# Patient Record
Sex: Male | Born: 1940 | Race: White | Hispanic: No | Smoking: Former smoker
Health system: Southern US, Community
[De-identification: ages and names within clinical notes are randomized; demographics above are authoritative.]

## PROBLEM LIST (undated history)

## (undated) DIAGNOSIS — M1990 Unspecified osteoarthritis, unspecified site: Secondary | ICD-10-CM

## (undated) DIAGNOSIS — E1121 Type 2 diabetes mellitus with diabetic nephropathy: Secondary | ICD-10-CM

## (undated) DIAGNOSIS — K219 Gastro-esophageal reflux disease without esophagitis: Secondary | ICD-10-CM

## (undated) DIAGNOSIS — M810 Age-related osteoporosis without current pathological fracture: Secondary | ICD-10-CM

## (undated) DIAGNOSIS — F329 Major depressive disorder, single episode, unspecified: Secondary | ICD-10-CM

## (undated) DIAGNOSIS — I739 Peripheral vascular disease, unspecified: Secondary | ICD-10-CM

## (undated) DIAGNOSIS — F32A Depression, unspecified: Secondary | ICD-10-CM

## (undated) DIAGNOSIS — F419 Anxiety disorder, unspecified: Secondary | ICD-10-CM

## (undated) DIAGNOSIS — K922 Gastrointestinal hemorrhage, unspecified: Secondary | ICD-10-CM

## (undated) DIAGNOSIS — N186 End stage renal disease: Secondary | ICD-10-CM

## (undated) DIAGNOSIS — I10 Essential (primary) hypertension: Secondary | ICD-10-CM

## (undated) DIAGNOSIS — J449 Chronic obstructive pulmonary disease, unspecified: Secondary | ICD-10-CM

## (undated) DIAGNOSIS — E119 Type 2 diabetes mellitus without complications: Secondary | ICD-10-CM

## (undated) DIAGNOSIS — E1140 Type 2 diabetes mellitus with diabetic neuropathy, unspecified: Secondary | ICD-10-CM

## (undated) DIAGNOSIS — D649 Anemia, unspecified: Secondary | ICD-10-CM

## (undated) DIAGNOSIS — K2970 Gastritis, unspecified, without bleeding: Secondary | ICD-10-CM

## (undated) DIAGNOSIS — I4891 Unspecified atrial fibrillation: Secondary | ICD-10-CM

## (undated) DIAGNOSIS — I779 Disorder of arteries and arterioles, unspecified: Secondary | ICD-10-CM

## (undated) DIAGNOSIS — G709 Myoneural disorder, unspecified: Secondary | ICD-10-CM

## (undated) DIAGNOSIS — I2510 Atherosclerotic heart disease of native coronary artery without angina pectoris: Secondary | ICD-10-CM

## (undated) HISTORY — DX: Type 2 diabetes mellitus with diabetic nephropathy: E11.21

## (undated) HISTORY — PX: AV FISTULA PLACEMENT: SHX1204

## (undated) HISTORY — PX: CORONARY STENT PLACEMENT: SHX1402

## (undated) HISTORY — PX: CARDIAC CATHETERIZATION: SHX172

## (undated) HISTORY — PX: EYE SURGERY: SHX253

## (undated) HISTORY — DX: Age-related osteoporosis without current pathological fracture: M81.0

## (undated) HISTORY — PX: HIP SURGERY: SHX245

## (undated) HISTORY — PX: JOINT REPLACEMENT: SHX530

## (undated) HISTORY — DX: Type 2 diabetes mellitus without complications: E11.9

## (undated) HISTORY — DX: End stage renal disease: N18.6

## (undated) HISTORY — DX: Chronic obstructive pulmonary disease, unspecified: J44.9

## (undated) HISTORY — DX: Type 2 diabetes mellitus with diabetic neuropathy, unspecified: E11.40

---

## 2003-12-07 ENCOUNTER — Other Ambulatory Visit: Payer: Self-pay

## 2003-12-15 ENCOUNTER — Other Ambulatory Visit: Payer: Self-pay

## 2004-12-18 ENCOUNTER — Ambulatory Visit: Payer: Self-pay | Admitting: Pain Medicine

## 2004-12-23 ENCOUNTER — Ambulatory Visit: Payer: Self-pay | Admitting: Pain Medicine

## 2005-01-23 ENCOUNTER — Ambulatory Visit: Payer: Self-pay | Admitting: Pain Medicine

## 2005-01-27 ENCOUNTER — Ambulatory Visit: Payer: Self-pay | Admitting: Pain Medicine

## 2005-02-27 ENCOUNTER — Ambulatory Visit: Payer: Self-pay | Admitting: Pain Medicine

## 2005-03-03 ENCOUNTER — Ambulatory Visit: Payer: Self-pay | Admitting: Pain Medicine

## 2005-03-10 ENCOUNTER — Ambulatory Visit: Payer: Self-pay | Admitting: Pain Medicine

## 2005-04-02 ENCOUNTER — Ambulatory Visit: Payer: Self-pay | Admitting: Pain Medicine

## 2005-05-15 ENCOUNTER — Ambulatory Visit: Payer: Self-pay | Admitting: Pain Medicine

## 2005-05-26 ENCOUNTER — Ambulatory Visit: Payer: Self-pay | Admitting: Pain Medicine

## 2005-07-01 ENCOUNTER — Ambulatory Visit: Payer: Self-pay | Admitting: Pain Medicine

## 2005-07-07 ENCOUNTER — Ambulatory Visit: Payer: Self-pay | Admitting: Pain Medicine

## 2005-08-27 ENCOUNTER — Ambulatory Visit: Payer: Self-pay | Admitting: Pain Medicine

## 2005-09-04 ENCOUNTER — Ambulatory Visit: Payer: Self-pay | Admitting: Pain Medicine

## 2005-09-22 ENCOUNTER — Ambulatory Visit: Payer: Self-pay | Admitting: Pain Medicine

## 2005-10-02 ENCOUNTER — Ambulatory Visit: Payer: Self-pay | Admitting: *Deleted

## 2005-10-28 ENCOUNTER — Ambulatory Visit: Payer: Self-pay | Admitting: Pain Medicine

## 2005-11-03 ENCOUNTER — Ambulatory Visit: Payer: Self-pay | Admitting: Pain Medicine

## 2005-11-19 ENCOUNTER — Ambulatory Visit: Payer: Self-pay | Admitting: Pain Medicine

## 2005-12-11 ENCOUNTER — Ambulatory Visit: Payer: Self-pay | Admitting: Pain Medicine

## 2005-12-15 ENCOUNTER — Ambulatory Visit: Payer: Self-pay | Admitting: Pain Medicine

## 2006-01-06 ENCOUNTER — Ambulatory Visit: Payer: Self-pay | Admitting: Pain Medicine

## 2006-01-14 ENCOUNTER — Ambulatory Visit: Payer: Self-pay | Admitting: Pain Medicine

## 2006-01-15 ENCOUNTER — Ambulatory Visit: Payer: Self-pay | Admitting: Pain Medicine

## 2006-01-29 ENCOUNTER — Ambulatory Visit: Payer: Self-pay | Admitting: Pain Medicine

## 2006-03-03 ENCOUNTER — Ambulatory Visit: Payer: Self-pay | Admitting: Pain Medicine

## 2006-03-09 ENCOUNTER — Ambulatory Visit: Payer: Self-pay | Admitting: Pain Medicine

## 2006-03-31 ENCOUNTER — Ambulatory Visit: Payer: Self-pay | Admitting: Pain Medicine

## 2006-04-28 ENCOUNTER — Ambulatory Visit: Payer: Self-pay | Admitting: Pain Medicine

## 2006-06-16 ENCOUNTER — Ambulatory Visit: Payer: Self-pay | Admitting: Pain Medicine

## 2006-06-22 ENCOUNTER — Ambulatory Visit: Payer: Self-pay | Admitting: Pain Medicine

## 2006-07-23 ENCOUNTER — Ambulatory Visit: Payer: Self-pay | Admitting: Pain Medicine

## 2006-07-27 ENCOUNTER — Ambulatory Visit: Payer: Self-pay | Admitting: Pain Medicine

## 2006-08-27 ENCOUNTER — Ambulatory Visit: Payer: Self-pay | Admitting: Pain Medicine

## 2006-09-14 ENCOUNTER — Ambulatory Visit: Payer: Self-pay | Admitting: Pain Medicine

## 2006-10-20 ENCOUNTER — Ambulatory Visit: Payer: Self-pay | Admitting: Pain Medicine

## 2006-10-28 ENCOUNTER — Ambulatory Visit: Payer: Self-pay | Admitting: Pain Medicine

## 2007-08-31 ENCOUNTER — Ambulatory Visit: Payer: Self-pay | Admitting: Gastroenterology

## 2008-01-04 ENCOUNTER — Inpatient Hospital Stay: Payer: Self-pay | Admitting: Internal Medicine

## 2008-01-04 ENCOUNTER — Other Ambulatory Visit: Payer: Self-pay

## 2008-01-18 ENCOUNTER — Ambulatory Visit: Payer: Self-pay | Admitting: Internal Medicine

## 2008-02-16 ENCOUNTER — Ambulatory Visit: Payer: Self-pay | Admitting: Internal Medicine

## 2011-01-04 ENCOUNTER — Ambulatory Visit: Payer: Self-pay | Admitting: Internal Medicine

## 2011-01-21 ENCOUNTER — Inpatient Hospital Stay: Payer: Self-pay | Admitting: Internal Medicine

## 2011-03-04 ENCOUNTER — Ambulatory Visit: Payer: Self-pay | Admitting: Vascular Surgery

## 2011-03-10 ENCOUNTER — Ambulatory Visit: Payer: Self-pay | Admitting: Internal Medicine

## 2011-05-07 ENCOUNTER — Ambulatory Visit: Payer: Self-pay | Admitting: Nephrology

## 2011-05-30 ENCOUNTER — Ambulatory Visit: Payer: Self-pay | Admitting: Vascular Surgery

## 2011-06-04 ENCOUNTER — Ambulatory Visit: Payer: Self-pay | Admitting: Vascular Surgery

## 2011-10-20 ENCOUNTER — Ambulatory Visit: Payer: Self-pay | Admitting: Vascular Surgery

## 2011-10-20 LAB — POTASSIUM: Potassium: 3.9 mmol/L (ref 3.5–5.1)

## 2011-12-29 ENCOUNTER — Ambulatory Visit: Payer: Self-pay | Admitting: Vascular Surgery

## 2011-12-29 LAB — POTASSIUM: Potassium: 3 mmol/L — ABNORMAL LOW (ref 3.5–5.1)

## 2012-01-07 ENCOUNTER — Inpatient Hospital Stay: Payer: Self-pay | Admitting: Internal Medicine

## 2012-01-07 LAB — TROPONIN I: Troponin-I: <0.02 ng/mL

## 2012-01-08 LAB — BASIC METABOLIC PANEL
BUN: 154 mg/dL — ABNORMAL HIGH (ref 7–18)
BUN: 144 mg/dL — ABNORMAL HIGH (ref 7–18)
Calcium, Total: 8 mg/dL — ABNORMAL LOW (ref 8.5–10.1)
Chloride: 87 mmol/L — ABNORMAL LOW (ref 98–107)
Chloride: 92 mmol/L — ABNORMAL LOW (ref 98–107)
Co2: 30 mmol/L (ref 21–32)
Creatinine: 4.7 mg/dL — ABNORMAL HIGH (ref 0.60–1.30)
EGFR (African American): 14 — ABNORMAL LOW
EGFR (Non-African Amer.): 12 — ABNORMAL LOW
EGFR (Non-African Amer.): 12 — ABNORMAL LOW
Osmolality: 260 (ref 275–301)
Potassium: 2.3 mmol/L — CL (ref 3.5–5.1)
Potassium: 3.4 mmol/L — ABNORMAL LOW (ref 3.5–5.1)
Sodium: 132 mmol/L — ABNORMAL LOW (ref 136–145)

## 2012-01-08 LAB — CBC WITH DIFFERENTIAL/PLATELET
Basophil #: 0.1 10*3/uL (ref 0.0–0.1)
Eosinophil #: 0.4 10*3/uL (ref 0.0–0.7)
HCT: 22.4 % — ABNORMAL LOW (ref 40.0–52.0)
HGB: 7.7 g/dL — ABNORMAL LOW (ref 13.0–18.0)
Lymphocyte #: 1.2 10*3/uL (ref 1.0–3.6)
Lymphocyte %: 12.8 %
MCH: 30.1 pg (ref 26.0–34.0)
MCV: 88 fL (ref 80–100)
Monocyte #: 0.8 x10 3/mm (ref 0.2–1.0)
Platelet: 184 10*3/uL (ref 150–440)
RBC: 2.56 10*6/uL — ABNORMAL LOW (ref 4.40–5.90)

## 2012-01-08 LAB — TROPONIN I
Troponin-I: <0.02 ng/mL
Troponin-I: <0.02 ng/mL

## 2012-01-09 LAB — CBC WITH DIFFERENTIAL/PLATELET
Basophil #: 0.1 10*3/uL (ref 0.0–0.1)
Eosinophil #: 0.5 10*3/uL (ref 0.0–0.7)
Eosinophil %: 6.4 %
HCT: 27.1 % — ABNORMAL LOW (ref 40.0–52.0)
MCH: 29.4 pg (ref 26.0–34.0)
Monocyte #: 0.8 x10 3/mm (ref 0.2–1.0)
RBC: 3.09 10*6/uL — ABNORMAL LOW (ref 4.40–5.90)
RDW: 14.7 % — ABNORMAL HIGH (ref 11.5–14.5)
WBC: 8 10*3/uL (ref 3.8–10.6)

## 2012-01-09 LAB — URIC ACID: Uric Acid: 9.3 mg/dL — ABNORMAL HIGH (ref 3.5–7.2)

## 2012-01-09 LAB — BASIC METABOLIC PANEL
Chloride: 97 mmol/L — ABNORMAL LOW (ref 98–107)
EGFR (African American): 15 — ABNORMAL LOW
EGFR (Non-African Amer.): 13 — ABNORMAL LOW
Osmolality: 325 (ref 275–301)
Sodium: 137 mmol/L (ref 136–145)

## 2012-01-10 LAB — BASIC METABOLIC PANEL
BUN: 122 mg/dL — ABNORMAL HIGH (ref 7–18)
Calcium, Total: 8.4 mg/dL — ABNORMAL LOW (ref 8.5–10.1)
Co2: 24 mmol/L (ref 21–32)
Creatinine: 4.01 mg/dL — ABNORMAL HIGH (ref 0.60–1.30)
EGFR (African American): 17 — ABNORMAL LOW
EGFR (Non-African Amer.): 14 — ABNORMAL LOW
Osmolality: 320 (ref 275–301)
Potassium: 3.5 mmol/L (ref 3.5–5.1)

## 2012-01-11 LAB — CBC WITH DIFFERENTIAL/PLATELET
Basophil #: 0.2 10*3/uL — ABNORMAL HIGH (ref 0.0–0.1)
Basophil %: 1.5 %
Eosinophil #: 0.7 10*3/uL (ref 0.0–0.7)
Eosinophil %: 6.3 %
HCT: 30 % — ABNORMAL LOW (ref 40.0–52.0)
HGB: 9.9 g/dL — ABNORMAL LOW (ref 13.0–18.0)
Lymphocyte #: 1.9 10*3/uL (ref 1.0–3.6)
Lymphocyte %: 17.7 %
MCH: 29.3 pg (ref 26.0–34.0)
MCV: 89 fL (ref 80–100)
Monocyte %: 9.1 %
Neutrophil %: 65.4 %
Platelet: 238 10*3/uL (ref 150–440)
RBC: 3.37 10*6/uL — ABNORMAL LOW (ref 4.40–5.90)
WBC: 10.7 10*3/uL — ABNORMAL HIGH (ref 3.8–10.6)

## 2012-01-11 LAB — COMPREHENSIVE METABOLIC PANEL
Alkaline Phosphatase: 78 U/L (ref 50–136)
Anion Gap: 12 (ref 7–16)
Bilirubin,Total: 0.6 mg/dL (ref 0.2–1.0)
Creatinine: 3.84 mg/dL — ABNORMAL HIGH (ref 0.60–1.30)
EGFR (Non-African Amer.): 15 — ABNORMAL LOW
Osmolality: 308 (ref 275–301)
SGOT(AST): 11 U/L — ABNORMAL LOW (ref 15–37)
Total Protein: 7.4 g/dL (ref 6.4–8.2)

## 2012-01-13 LAB — PATHOLOGY REPORT

## 2012-09-09 ENCOUNTER — Ambulatory Visit: Payer: Self-pay | Admitting: Vascular Surgery

## 2012-09-09 DIAGNOSIS — I499 Cardiac arrhythmia, unspecified: Secondary | ICD-10-CM

## 2012-09-09 LAB — BASIC METABOLIC PANEL
Chloride: 97 mmol/L — ABNORMAL LOW (ref 98–107)
Creatinine: 3.56 mg/dL — ABNORMAL HIGH (ref 0.60–1.30)
EGFR (African American): 19 — ABNORMAL LOW
Osmolality: 292 (ref 275–301)

## 2012-09-15 ENCOUNTER — Inpatient Hospital Stay: Payer: Self-pay

## 2012-09-15 LAB — URINALYSIS, COMPLETE
Bacteria: NONE SEEN
Blood: NEGATIVE
Glucose,UR: >=500 mg/dL (ref 0–75)
Leukocyte Esterase: NEGATIVE
Ph: 7 (ref 4.5–8.0)
RBC,UR: NONE SEEN /HPF (ref 0–5)
Squamous Epithelial: NONE SEEN
WBC UR: 1 /HPF (ref 0–5)

## 2012-09-15 LAB — CBC WITH DIFFERENTIAL/PLATELET
Basophil #: 0.1 10*3/uL (ref 0.0–0.1)
Eosinophil #: 0.1 10*3/uL (ref 0.0–0.7)
HGB: 7.7 g/dL — ABNORMAL LOW (ref 13.0–18.0)
Lymphocyte #: 1.1 10*3/uL (ref 1.0–3.6)
Lymphocyte %: 15.1 %
MCH: 27.8 pg (ref 26.0–34.0)
MCHC: 33.1 g/dL (ref 32.0–36.0)
Monocyte %: 4.1 %
Neutrophil #: 5.9 10*3/uL (ref 1.4–6.5)
WBC: 7.6 10*3/uL (ref 3.8–10.6)

## 2012-09-15 LAB — COMPREHENSIVE METABOLIC PANEL
Anion Gap: 11 (ref 7–16)
BUN: 55 mg/dL — ABNORMAL HIGH (ref 7–18)
Bilirubin,Total: 0.3 mg/dL (ref 0.2–1.0)
Chloride: 103 mmol/L (ref 98–107)
Co2: 22 mmol/L (ref 21–32)
EGFR (African American): 20 — ABNORMAL LOW
EGFR (Non-African Amer.): 17 — ABNORMAL LOW
Potassium: 4 mmol/L (ref 3.5–5.1)
SGOT(AST): 12 U/L — ABNORMAL LOW (ref 15–37)
SGPT (ALT): 10 U/L — ABNORMAL LOW (ref 12–78)
Total Protein: 7.5 g/dL (ref 6.4–8.2)

## 2012-09-15 LAB — CK TOTAL AND CKMB (NOT AT ARMC)
CK, Total: 72 U/L (ref 35–232)
CK, Total: 70 U/L (ref 35–232)

## 2012-09-15 LAB — TROPONIN I: Troponin-I: 0.41 ng/mL — ABNORMAL HIGH

## 2012-09-16 LAB — TROPONIN I: Troponin-I: 0.23 ng/mL — ABNORMAL HIGH

## 2012-09-16 LAB — CBC WITH DIFFERENTIAL/PLATELET
Basophil #: 0.1 10*3/uL (ref 0.0–0.1)
Eosinophil #: 0.5 10*3/uL (ref 0.0–0.7)
Eosinophil %: 5.9 %
HCT: 22.7 % — ABNORMAL LOW (ref 40.0–52.0)
Lymphocyte #: 1.2 10*3/uL (ref 1.0–3.6)
MCH: 27 pg (ref 26.0–34.0)
MCHC: 32.4 g/dL (ref 32.0–36.0)
Neutrophil #: 6 10*3/uL (ref 1.4–6.5)
Neutrophil %: 71 %
RBC: 2.72 10*6/uL — ABNORMAL LOW (ref 4.40–5.90)
WBC: 8.4 10*3/uL (ref 3.8–10.6)

## 2012-09-16 LAB — BASIC METABOLIC PANEL
Anion Gap: 10 (ref 7–16)
BUN: 55 mg/dL — ABNORMAL HIGH (ref 7–18)
Chloride: 106 mmol/L (ref 98–107)
Co2: 22 mmol/L (ref 21–32)
EGFR (Non-African Amer.): 18 — ABNORMAL LOW
Potassium: 3.8 mmol/L (ref 3.5–5.1)

## 2012-09-16 LAB — IRON AND TIBC
Iron Bind.Cap.(Total): 377 ug/dL (ref 250–450)
Iron Saturation: 6 %
Iron: 22 ug/dL — ABNORMAL LOW (ref 65–175)

## 2012-09-16 LAB — CK TOTAL AND CKMB (NOT AT ARMC): CK-MB: 2 ng/mL (ref 0.5–3.6)

## 2012-09-16 LAB — RETICULOCYTES: Absolute Retic Count: 0.052 10*6/uL (ref 0.031–0.129)

## 2012-09-16 LAB — FERRITIN: Ferritin (ARMC): 27 ng/mL (ref 8–388)

## 2012-09-17 LAB — BASIC METABOLIC PANEL
BUN: 50 mg/dL — ABNORMAL HIGH (ref 7–18)
Chloride: 110 mmol/L — ABNORMAL HIGH (ref 98–107)
Creatinine: 3.02 mg/dL — ABNORMAL HIGH (ref 0.60–1.30)
EGFR (African American): 23 — ABNORMAL LOW
Potassium: 4.1 mmol/L (ref 3.5–5.1)
Sodium: 139 mmol/L (ref 136–145)

## 2012-09-17 LAB — HEMATOCRIT: HCT: 26.1 % — ABNORMAL LOW (ref 40.0–52.0)

## 2012-09-17 LAB — PROTEIN ELECTROPHORESIS(ARMC)

## 2012-09-18 LAB — BASIC METABOLIC PANEL
Anion Gap: 11 (ref 7–16)
BUN: 43 mg/dL — ABNORMAL HIGH (ref 7–18)
Calcium, Total: 8.9 mg/dL (ref 8.5–10.1)
Chloride: 110 mmol/L — ABNORMAL HIGH (ref 98–107)
EGFR (African American): 25 — ABNORMAL LOW
Potassium: 4.2 mmol/L (ref 3.5–5.1)
Sodium: 140 mmol/L (ref 136–145)

## 2012-09-18 LAB — CBC WITH DIFFERENTIAL/PLATELET
Basophil #: 0.1 10*3/uL (ref 0.0–0.1)
Basophil %: 1.5 %
Lymphocyte %: 18.9 %
Monocyte #: 0.7 x10 3/mm (ref 0.2–1.0)
Monocyte %: 8.4 %
Neutrophil #: 5.2 10*3/uL (ref 1.4–6.5)
Neutrophil %: 62.8 %
RBC: 3.28 10*6/uL — ABNORMAL LOW (ref 4.40–5.90)
RDW: 15.7 % — ABNORMAL HIGH (ref 11.5–14.5)

## 2012-09-23 ENCOUNTER — Ambulatory Visit: Payer: Self-pay | Admitting: Vascular Surgery

## 2012-09-23 LAB — CBC
HCT: 24.2 % — ABNORMAL LOW (ref 40.0–52.0)
MCHC: 32.8 g/dL (ref 32.0–36.0)
MCV: 81 fL (ref 80–100)
RBC: 2.98 10*6/uL — ABNORMAL LOW (ref 4.40–5.90)
RDW: 16.3 % — ABNORMAL HIGH (ref 11.5–14.5)
WBC: 8.6 10*3/uL (ref 3.8–10.6)

## 2012-09-23 LAB — BASIC METABOLIC PANEL
BUN: 60 mg/dL — ABNORMAL HIGH (ref 7–18)
Calcium, Total: 8.8 mg/dL (ref 8.5–10.1)
Co2: 23 mmol/L (ref 21–32)
Creatinine: 3.51 mg/dL — ABNORMAL HIGH (ref 0.60–1.30)
EGFR (Non-African Amer.): 17 — ABNORMAL LOW
Glucose: 184 mg/dL — ABNORMAL HIGH (ref 65–99)
Potassium: 3.5 mmol/L (ref 3.5–5.1)

## 2012-10-19 ENCOUNTER — Emergency Department (HOSPITAL_COMMUNITY): Payer: Medicare Other

## 2012-10-19 ENCOUNTER — Encounter (HOSPITAL_COMMUNITY): Payer: Self-pay | Admitting: Emergency Medicine

## 2012-10-19 ENCOUNTER — Inpatient Hospital Stay (HOSPITAL_COMMUNITY)
Admission: EM | Admit: 2012-10-19 | Discharge: 2012-10-27 | DRG: 280 | Disposition: A | Discharge status: Home Health | Payer: Medicare Other | Attending: Internal Medicine | Admitting: Internal Medicine

## 2012-10-19 DIAGNOSIS — I120 Hypertensive chronic kidney disease with stage 5 chronic kidney disease or end stage renal disease: Secondary | ICD-10-CM | POA: Diagnosis present

## 2012-10-19 DIAGNOSIS — I249 Acute ischemic heart disease, unspecified: Secondary | ICD-10-CM

## 2012-10-19 DIAGNOSIS — R739 Hyperglycemia, unspecified: Secondary | ICD-10-CM

## 2012-10-19 DIAGNOSIS — I214 Non-ST elevation (NSTEMI) myocardial infarction: Principal | ICD-10-CM | POA: Diagnosis present

## 2012-10-19 DIAGNOSIS — N189 Chronic kidney disease, unspecified: Secondary | ICD-10-CM

## 2012-10-19 DIAGNOSIS — Z87891 Personal history of nicotine dependence: Secondary | ICD-10-CM

## 2012-10-19 DIAGNOSIS — J9600 Acute respiratory failure, unspecified whether with hypoxia or hypercapnia: Secondary | ICD-10-CM | POA: Diagnosis present

## 2012-10-19 DIAGNOSIS — N186 End stage renal disease: Secondary | ICD-10-CM | POA: Diagnosis present

## 2012-10-19 DIAGNOSIS — D509 Iron deficiency anemia, unspecified: Secondary | ICD-10-CM | POA: Diagnosis present

## 2012-10-19 DIAGNOSIS — I255 Ischemic cardiomyopathy: Secondary | ICD-10-CM

## 2012-10-19 DIAGNOSIS — M129 Arthropathy, unspecified: Secondary | ICD-10-CM | POA: Diagnosis present

## 2012-10-19 DIAGNOSIS — I5021 Acute systolic (congestive) heart failure: Secondary | ICD-10-CM

## 2012-10-19 DIAGNOSIS — I2589 Other forms of chronic ischemic heart disease: Secondary | ICD-10-CM | POA: Diagnosis present

## 2012-10-19 DIAGNOSIS — E1129 Type 2 diabetes mellitus with other diabetic kidney complication: Secondary | ICD-10-CM

## 2012-10-19 DIAGNOSIS — Z992 Dependence on renal dialysis: Secondary | ICD-10-CM

## 2012-10-19 DIAGNOSIS — E46 Unspecified protein-calorie malnutrition: Secondary | ICD-10-CM | POA: Diagnosis present

## 2012-10-19 DIAGNOSIS — K922 Gastrointestinal hemorrhage, unspecified: Secondary | ICD-10-CM

## 2012-10-19 DIAGNOSIS — A419 Sepsis, unspecified organism: Secondary | ICD-10-CM

## 2012-10-19 DIAGNOSIS — I6529 Occlusion and stenosis of unspecified carotid artery: Secondary | ICD-10-CM | POA: Diagnosis present

## 2012-10-19 DIAGNOSIS — J9601 Acute respiratory failure with hypoxia: Secondary | ICD-10-CM

## 2012-10-19 DIAGNOSIS — K921 Melena: Secondary | ICD-10-CM | POA: Diagnosis present

## 2012-10-19 DIAGNOSIS — D649 Anemia, unspecified: Secondary | ICD-10-CM

## 2012-10-19 DIAGNOSIS — I480 Paroxysmal atrial fibrillation: Secondary | ICD-10-CM

## 2012-10-19 DIAGNOSIS — J189 Pneumonia, unspecified organism: Secondary | ICD-10-CM | POA: Diagnosis present

## 2012-10-19 DIAGNOSIS — Z9861 Coronary angioplasty status: Secondary | ICD-10-CM

## 2012-10-19 DIAGNOSIS — D62 Acute posthemorrhagic anemia: Secondary | ICD-10-CM | POA: Diagnosis present

## 2012-10-19 DIAGNOSIS — I5023 Acute on chronic systolic (congestive) heart failure: Secondary | ICD-10-CM | POA: Diagnosis present

## 2012-10-19 DIAGNOSIS — I252 Old myocardial infarction: Secondary | ICD-10-CM

## 2012-10-19 DIAGNOSIS — K5900 Constipation, unspecified: Secondary | ICD-10-CM | POA: Diagnosis present

## 2012-10-19 DIAGNOSIS — E119 Type 2 diabetes mellitus without complications: Secondary | ICD-10-CM | POA: Diagnosis present

## 2012-10-19 DIAGNOSIS — I509 Heart failure, unspecified: Secondary | ICD-10-CM | POA: Diagnosis present

## 2012-10-19 DIAGNOSIS — I2510 Atherosclerotic heart disease of native coronary artery without angina pectoris: Secondary | ICD-10-CM | POA: Diagnosis present

## 2012-10-19 DIAGNOSIS — R509 Fever, unspecified: Secondary | ICD-10-CM

## 2012-10-19 DIAGNOSIS — I4891 Unspecified atrial fibrillation: Secondary | ICD-10-CM | POA: Diagnosis present

## 2012-10-19 HISTORY — DX: Essential (primary) hypertension: I10

## 2012-10-19 HISTORY — DX: Unspecified osteoarthritis, unspecified site: M19.90

## 2012-10-19 HISTORY — DX: Disorder of arteries and arterioles, unspecified: I77.9

## 2012-10-19 HISTORY — DX: Gastritis, unspecified, without bleeding: K29.70

## 2012-10-19 HISTORY — DX: Peripheral vascular disease, unspecified: I73.9

## 2012-10-19 HISTORY — DX: Atherosclerotic heart disease of native coronary artery without angina pectoris: I25.10

## 2012-10-19 LAB — LACTIC ACID, PLASMA: Lactic Acid, Venous: 3 mmol/L — ABNORMAL HIGH (ref 0.5–2.2)

## 2012-10-19 LAB — COMPREHENSIVE METABOLIC PANEL WITH GFR
ALT: 7 U/L (ref 0–53)
AST: 33 U/L (ref 0–37)
Albumin: 3.2 g/dL — ABNORMAL LOW (ref 3.5–5.2)
Alkaline Phosphatase: 109 U/L (ref 39–117)
BUN: 60 mg/dL — ABNORMAL HIGH (ref 6–23)
CO2: 22 meq/L (ref 19–32)
Calcium: 9.8 mg/dL (ref 8.4–10.5)
Chloride: 90 meq/L — ABNORMAL LOW (ref 96–112)
Creatinine, Ser: 3.89 mg/dL — ABNORMAL HIGH (ref 0.50–1.35)
GFR calc Af Amer: 17 mL/min — ABNORMAL LOW
GFR calc non Af Amer: 14 mL/min — ABNORMAL LOW
Glucose, Bld: 215 mg/dL — ABNORMAL HIGH (ref 70–99)
Potassium: 3.7 meq/L (ref 3.5–5.1)
Sodium: 130 meq/L — ABNORMAL LOW (ref 135–145)
Total Bilirubin: 0.8 mg/dL (ref 0.3–1.2)
Total Protein: 7.4 g/dL (ref 6.0–8.3)

## 2012-10-19 LAB — PROTIME-INR
INR: 1.29 (ref 0.00–1.49)
Prothrombin Time: 15.8 s — ABNORMAL HIGH (ref 11.6–15.2)

## 2012-10-19 LAB — POCT I-STAT, CHEM 8
BUN: 62 mg/dL — ABNORMAL HIGH (ref 6–23)
Calcium, Ion: 1.12 mmol/L — ABNORMAL LOW (ref 1.13–1.30)
Chloride: 95 mEq/L — ABNORMAL LOW (ref 96–112)
Glucose, Bld: 207 mg/dL — ABNORMAL HIGH (ref 70–99)

## 2012-10-19 LAB — OCCULT BLOOD, POC DEVICE: Fecal Occult Bld: POSITIVE — AB

## 2012-10-19 LAB — CBC WITH DIFFERENTIAL/PLATELET
Basophils Absolute: 0 10*3/uL (ref 0.0–0.1)
Basophils Relative: 0 % (ref 0–1)
Eosinophils Absolute: 0 10*3/uL (ref 0.0–0.7)
Eosinophils Relative: 0 % (ref 0–5)
HCT: 21.7 % — ABNORMAL LOW (ref 39.0–52.0)
Hemoglobin: 7.2 g/dL — ABNORMAL LOW (ref 13.0–17.0)
MCH: 23.7 pg — ABNORMAL LOW (ref 26.0–34.0)
MCHC: 33.2 g/dL (ref 30.0–36.0)
MCV: 71.4 fL — ABNORMAL LOW (ref 78.0–100.0)
Monocytes Absolute: 1.8 10*3/uL — ABNORMAL HIGH (ref 0.1–1.0)
Monocytes Relative: 9 % (ref 3–12)
RDW: 15.8 % — ABNORMAL HIGH (ref 11.5–15.5)

## 2012-10-19 LAB — APTT: aPTT: 31 seconds (ref 24–37)

## 2012-10-19 LAB — GLUCOSE, CAPILLARY: Glucose-Capillary: 214 mg/dL — ABNORMAL HIGH (ref 70–99)

## 2012-10-19 MED ORDER — MORPHINE SULFATE 4 MG/ML IJ SOLN
4.0000 mg | Freq: Once | INTRAMUSCULAR | Status: AC
  Administered 2012-10-19: 4 mg via INTRAVENOUS
  Filled 2012-10-19: qty 1

## 2012-10-19 MED ORDER — SODIUM CHLORIDE 0.9 % IV BOLUS (SEPSIS)
250.0000 mL | Freq: Once | INTRAVENOUS | Status: AC
  Administered 2012-10-19: 250 mL via INTRAVENOUS

## 2012-10-19 MED ORDER — PIPERACILLIN-TAZOBACTAM 3.375 G IVPB
3.3750 g | Freq: Once | INTRAVENOUS | Status: AC
  Administered 2012-10-19: 3.375 g via INTRAVENOUS
  Filled 2012-10-19: qty 50

## 2012-10-19 MED ORDER — ACETAMINOPHEN 325 MG PO TABS
650.0000 mg | ORAL_TABLET | Freq: Once | ORAL | Status: AC
  Administered 2012-10-19: 650 mg via ORAL
  Filled 2012-10-19: qty 2

## 2012-10-19 MED ORDER — VANCOMYCIN HCL IN DEXTROSE 1-5 GM/200ML-% IV SOLN
1000.0000 mg | Freq: Once | INTRAVENOUS | Status: AC
  Administered 2012-10-19: 1000 mg via INTRAVENOUS
  Filled 2012-10-19: qty 200

## 2012-10-19 NOTE — ED Notes (Signed)
Nicholas [name] (sister-in-law) home telephone number [phone] and cell phone [phone] call with updates. Nicholas [name] (son): [phone]

## 2012-10-19 NOTE — ED Provider Notes (Signed)
Medical screening examination/treatment/procedure(s) were conducted as a shared visit with non-physician practitioner(s) and myself.  I personally evaluated the patient during the encounter  The patient is critically ill.  He has multifocal pneumonia.  Vancomycin and Zosyn given for suspected healthcare associated pneumonia.  Febrile 202 in the emergency department.  Lactate is 3.0.  The patient is not hypotensive.  The patient is newly anemic with a hemoglobin of 7.2.  He is quite positive.  This is likely upper GI bleed.  The patient will be transfused packed red blood cells and will be started on a proton ex drip.  Her tox bolus in the emergency department.  The patient also has evidence of a non-ST elevation MI with a troponin of 6.  He has nonspecific lateral ST changes without ST elevation.  The patient did present with burning chest pain.  After morphine and oxygen the patient's chest pain has resolved.  The patient is not a candidate for aspirin given what appears to be an upper GI bleed he is also not a candidate for anticoagulation given upper GI bleed.  This is all likely demand ischemia from his multifocal pneumonia, hypoxia, GI bleed with symptomatic anemia.  The patient be admitted to the intensive care unit.  1. HCAP (healthcare-associated pneumonia)   2. Fever   3. Anemia   4. GI bleed   5. NSTEMI (non-ST elevated myocardial infarction)   6. Sepsis      Date: 10/19/2012  Rate: 110  Rhythm: normal sinus rhythm  QRS Axis: normal  Intervals: normal  ST/T Wave abnormalities: Nonspecific lateral ST changes  Conduction Disutrbances: none  Narrative Interpretation:   Old EKG Reviewed:  No prior EKG available  CRITICAL CARE Performed by: Lyanne Co Total critical care time: 35 Critical care time was exclusive of separately billable procedures and treating other patients. Critical care was necessary to treat or prevent imminent or life-threatening deterioration. Critical care  was time spent personally by me on the following activities: development of treatment plan with patient and/or surrogate as well as nursing, discussions with consultants, evaluation of patient's response to treatment, examination of patient, obtaining history from patient or surrogate, ordering and performing treatments and interventions, ordering and review of laboratory studies, ordering and review of radiographic studies, pulse oximetry and re-evaluation of patient's condition.  Dg Chest 2 View  10/19/2012  *RADIOLOGY REPORT*  Clinical Data: Chest pain and shortness of breath.  CHEST - 2 VIEW  Comparison: None.  Findings: There are extensive bilateral airspace disease and small pleural effusions.  No pneumothorax is identified.  There is cardiomegaly.  IMPRESSION: Extensive bilateral airspace disease with pleural effusions could be due to multi focal pneumonia or pulmonary edema.   Original Report Authenticated By: Holley Dexter, M.D.    Results for orders placed during the hospital encounter of 10/19/12  CBC WITH DIFFERENTIAL      Result Value Range   WBC 19.0 (*) 4.0 - 10.5 K/uL   RBC 3.04 (*) 4.22 - 5.81 MIL/uL   Hemoglobin 7.2 (*) 13.0 - 17.0 g/dL   HCT 30.8 (*) 65.7 - 84.6 %   MCV 71.4 (*) 78.0 - 100.0 fL   MCH 23.7 (*) 26.0 - 34.0 pg   MCHC 33.2  30.0 - 36.0 g/dL   RDW 96.2 (*) 95.2 - 84.1 %   Platelets 372  150 - 400 K/uL   Neutrophils Relative 87 (*) 43 - 77 %   Neutro Abs 16.4 (*) 1.7 - 7.7 K/uL  Lymphocytes Relative 4 (*) 12 - 46 %   Lymphs Abs 0.8  0.7 - 4.0 K/uL   Monocytes Relative 9  3 - 12 %   Monocytes Absolute 1.8 (*) 0.1 - 1.0 K/uL   Eosinophils Relative 0  0 - 5 %   Eosinophils Absolute 0.0  0.0 - 0.7 K/uL   Basophils Relative 0  0 - 1 %   Basophils Absolute 0.0  0.0 - 0.1 K/uL  COMPREHENSIVE METABOLIC PANEL      Result Value Range   Sodium 130 (*) 135 - 145 mEq/L   Potassium 3.7  3.5 - 5.1 mEq/L   Chloride 90 (*) 96 - 112 mEq/L   CO2 22  19 - 32 mEq/L    Glucose, Bld 215 (*) 70 - 99 mg/dL   BUN 60 (*) 6 - 23 mg/dL   Creatinine, Ser 1.61 (*) 0.50 - 1.35 mg/dL   Calcium 9.8  8.4 - 09.6 mg/dL   Total Protein 7.4  6.0 - 8.3 g/dL   Albumin 3.2 (*) 3.5 - 5.2 g/dL   AST 33  0 - 37 U/L   ALT 7  0 - 53 U/L   Alkaline Phosphatase 109  39 - 117 U/L   Total Bilirubin 0.8  0.3 - 1.2 mg/dL   GFR calc non Af Amer 14 (*) >90 mL/min   GFR calc Af Amer 17 (*) >90 mL/min  APTT      Result Value Range   aPTT 31  24 - 37 seconds  PROTIME-INR      Result Value Range   Prothrombin Time 15.8 (*) 11.6 - 15.2 seconds   INR 1.29  0.00 - 1.49  TROPONIN I      Result Value Range   Troponin I 6.24 (*) <0.30 ng/mL  LACTIC ACID, PLASMA      Result Value Range   Lactic Acid, Venous 3.0 (*) 0.5 - 2.2 mmol/L  GLUCOSE, CAPILLARY      Result Value Range   Glucose-Capillary 214 (*) 70 - 99 mg/dL  OCCULT BLOOD, POC DEVICE      Result Value Range   Fecal Occult Bld POSITIVE (*) NEGATIVE  POCT I-STAT, CHEM 8      Result Value Range   Sodium 131 (*) 135 - 145 mEq/L   Potassium 3.7  3.5 - 5.1 mEq/L   Chloride 95 (*) 96 - 112 mEq/L   BUN 62 (*) 6 - 23 mg/dL   Creatinine, Ser 0.45 (*) 0.50 - 1.35 mg/dL   Glucose, Bld 409 (*) 70 - 99 mg/dL   Calcium, Ion 8.11 (*) 1.13 - 1.30 mmol/L   TCO2 26  0 - 100 mmol/L   Hemoglobin 6.5 (*) 13.0 - 17.0 g/dL   HCT 91.4 (*) 78.2 - 95.6 %   Comment NOTIFIED PHYSICIAN    TYPE AND SCREEN      Result Value Range   ABO/RH(D) A POS     Antibody Screen NEG     Sample Expiration 10/22/2012     Unit Number [id number]     Blood Component Type RED CELLS,LR     Unit division 00     Status of Unit ISSUED     Transfusion Status OK TO TRANSFUSE     Crossmatch Result Compatible     Unit Number [id number]     Blood Component Type RED CELLS,LR     Unit division 00     Status of Unit ALLOCATED     Transfusion  Status OK TO TRANSFUSE     Crossmatch Result Compatible    PREPARE RBC (CROSSMATCH)      Result Value Range   Order  Confirmation ORDER PROCESSED BY BLOOD BANK    ABO/RH      Result Value Range   ABO/RH(D) A POS         Lyanne Co, MD 10/19/12 2358

## 2012-10-19 NOTE — ED Notes (Addendum)
Per ems- pt c/o of cp for 2 days also with productive cough. 12 lead unremarkable. 20 G R AC. 324 asa administered with 1 nitro with no relief. Pain increases with inspiration and palpation.reports sob. Fistula to R arm but has not started dialysis. Pt a&ox4. Hx of MI in 1996. Pt also has surgery scheduled this weekend for blockage in neck.

## 2012-10-19 NOTE — ED Provider Notes (Signed)
History     CSN: [id number]  Arrival date & time 10/19/12  8295   First MD Initiated Contact with Patient 10/19/12 2001      Chief Complaint  Patient presents with  . Chest Pain    (Consider location/radiation/quality/duration/timing/severity/associated sxs/prior treatment) Patient is a 72 y.o. male presenting with chest pain. The history is provided by the patient and the EMS personnel. No language interpreter was used.  Chest Pain Pain quality: burning   Associated symptoms: cough, shortness of breath and weakness   Associated symptoms: no abdominal pain, no fever, no nausea and not vomiting   Associated symptoms comment:  The patient arrives via EMS who were called for SOB, chest burning and generalized fatigue and weakness. SOB started yesterday. He has had a cough for 2 days. No known fever. He does not use oxygen at home. He has a history of CAD, pre-dialysis CKD, DM. He reports a recent admission in [place] for chest pain with a "negative evaluation" per patient. He did not have a catheterization at that admission. He also reports a bowel movement today that was black with a history of GI bleed.   Past Medical History  Diagnosis Date  . Renal disorder   . Coronary artery disease   . Diabetes mellitus without complication   . Hypertension   . Arthritis   . MI (myocardial infarction) 1992    Past Surgical History  Procedure Laterality Date  . Coronary stent placement    . Hip surgery      both hips  . Av fistula placement      Left arm    No family history on file.  History  Substance Use Topics  . Smoking status: Former Smoker    Types: Cigarettes  . Smokeless tobacco: Not on file  . Alcohol Use: Yes     Comment: occasionally      Review of Systems  Constitutional: Negative for fever.  HENT: Negative for congestion.   Respiratory: Positive for cough and shortness of breath.   Cardiovascular: Positive for chest pain and leg swelling.   Gastrointestinal: Negative for nausea, vomiting and abdominal pain.  Endocrine: Negative for polydipsia and polyuria.  Genitourinary: Negative for flank pain.  Musculoskeletal: Negative for myalgias.  Skin: Negative for rash.  Neurological: Positive for weakness. Negative for syncope.  Psychiatric/Behavioral: Negative for confusion.    Allergies  Statins  Home Medications  No current outpatient prescriptions on file.  BP 113/68  Pulse 112  Temp(Src) 99.2 F (37.3 C) (Oral)  Resp 26  SpO2 83%  Physical Exam  Constitutional: He is oriented to person, place, and time. He appears well-developed and well-nourished.  HENT:  Head: Normocephalic.  Eyes:  Marked conjunctival pallor.   Neck: Normal range of motion.  Cardiovascular: Normal rate and regular rhythm.   No murmur heard. Pulmonary/Chest: Effort normal.  Diminished breath sounds bilaterally. Scattered rales otherwise.   Abdominal: Soft. There is no tenderness. There is no rebound and no guarding.  Genitourinary: Guaiac positive stool.  Musculoskeletal: Normal range of motion. He exhibits edema.  2+ pitting edema of lower extremities.  Neurological: He is alert and oriented to person, place, and time.  Skin: There is pallor.  Psychiatric: He has a normal mood and affect.    ED Course  Procedures (including critical care time)  Labs Reviewed  CULTURE, BLOOD (ROUTINE X 2)  CULTURE, BLOOD (ROUTINE X 2)  URINE CULTURE  CBC WITH DIFFERENTIAL  COMPREHENSIVE METABOLIC PANEL  APTT  PROTIME-INR  TROPONIN I  LACTIC ACID, PLASMA  URINALYSIS, ROUTINE W REFLEX MICROSCOPIC  TYPE AND SCREEN   Results for orders placed during the hospital encounter of 10/19/12  CBC WITH DIFFERENTIAL      Result Value Range   WBC 19.0 (*) 4.0 - 10.5 K/uL   RBC 3.04 (*) 4.22 - 5.81 MIL/uL   Hemoglobin 7.2 (*) 13.0 - 17.0 g/dL   HCT 16.1 (*) 09.6 - 04.5 %   MCV 71.4 (*) 78.0 - 100.0 fL   MCH 23.7 (*) 26.0 - 34.0 pg   MCHC 33.2  30.0 -  36.0 g/dL   RDW 40.9 (*) 81.1 - 91.4 %   Platelets 372  150 - 400 K/uL   Neutrophils Relative 87 (*) 43 - 77 %   Neutro Abs 16.4 (*) 1.7 - 7.7 K/uL   Lymphocytes Relative 4 (*) 12 - 46 %   Lymphs Abs 0.8  0.7 - 4.0 K/uL   Monocytes Relative 9  3 - 12 %   Monocytes Absolute 1.8 (*) 0.1 - 1.0 K/uL   Eosinophils Relative 0  0 - 5 %   Eosinophils Absolute 0.0  0.0 - 0.7 K/uL   Basophils Relative 0  0 - 1 %   Basophils Absolute 0.0  0.0 - 0.1 K/uL  COMPREHENSIVE METABOLIC PANEL      Result Value Range   Sodium 130 (*) 135 - 145 mEq/L   Potassium 3.7  3.5 - 5.1 mEq/L   Chloride 90 (*) 96 - 112 mEq/L   CO2 22  19 - 32 mEq/L   Glucose, Bld 215 (*) 70 - 99 mg/dL   BUN 60 (*) 6 - 23 mg/dL   Creatinine, Ser 7.82 (*) 0.50 - 1.35 mg/dL   Calcium 9.8  8.4 - 95.6 mg/dL   Total Protein 7.4  6.0 - 8.3 g/dL   Albumin 3.2 (*) 3.5 - 5.2 g/dL   AST 33  0 - 37 U/L   ALT 7  0 - 53 U/L   Alkaline Phosphatase 109  39 - 117 U/L   Total Bilirubin 0.8  0.3 - 1.2 mg/dL   GFR calc non Af Amer 14 (*) >90 mL/min   GFR calc Af Amer 17 (*) >90 mL/min  APTT      Result Value Range   aPTT 31  24 - 37 seconds  PROTIME-INR      Result Value Range   Prothrombin Time 15.8 (*) 11.6 - 15.2 seconds   INR 1.29  0.00 - 1.49  TROPONIN I      Result Value Range   Troponin I 6.24 (*) <0.30 ng/mL  LACTIC ACID, PLASMA      Result Value Range   Lactic Acid, Venous 3.0 (*) 0.5 - 2.2 mmol/L  GLUCOSE, CAPILLARY      Result Value Range   Glucose-Capillary 214 (*) 70 - 99 mg/dL  OCCULT BLOOD, POC DEVICE      Result Value Range   Fecal Occult Bld POSITIVE (*) NEGATIVE  POCT I-STAT, CHEM 8      Result Value Range   Sodium 131 (*) 135 - 145 mEq/L   Potassium 3.7  3.5 - 5.1 mEq/L   Chloride 95 (*) 96 - 112 mEq/L   BUN 62 (*) 6 - 23 mg/dL   Creatinine, Ser 2.13 (*) 0.50 - 1.35 mg/dL   Glucose, Bld 086 (*) 70 - 99 mg/dL   Calcium, Ion 5.78 (*) 1.13 - 1.30 mmol/L   TCO2  26  0 - 100 mmol/L   Hemoglobin 6.5 (*) 13.0 -  17.0 g/dL   HCT 29.5 (*) 62.1 - 30.8 %   Comment NOTIFIED PHYSICIAN    TYPE AND SCREEN      Result Value Range   ABO/RH(D) A POS     Antibody Screen NEG     Sample Expiration 10/22/2012     Unit Number [id number]     Blood Component Type RED CELLS,LR     Unit division 00     Status of Unit ISSUED     Transfusion Status OK TO TRANSFUSE     Crossmatch Result Compatible     Unit Number [id number]     Blood Component Type RED CELLS,LR     Unit division 00     Status of Unit ALLOCATED     Transfusion Status OK TO TRANSFUSE     Crossmatch Result Compatible    PREPARE RBC (CROSSMATCH)      Result Value Range   Order Confirmation ORDER PROCESSED BY BLOOD BANK    ABO/RH      Result Value Range   ABO/RH(D) A POS     Dg Chest 2 View  10/19/2012  *RADIOLOGY REPORT*  Clinical Data: Chest pain and shortness of breath.  CHEST - 2 VIEW  Comparison: None.  Findings: There are extensive bilateral airspace disease and small pleural effusions.  No pneumothorax is identified.  There is cardiomegaly.  IMPRESSION: Extensive bilateral airspace disease with pleural effusions could be due to multi focal pneumonia or pulmonary edema.   Original Report Authenticated By: Holley Dexter, M.D.    No results found. CRITICAL CARE Performed by: Elpidio Anis A   Total critical care time: 50  Critical care time was exclusive of separately billable procedures and treating other patients.  Critical care was necessary to treat or prevent imminent or life-threatening deterioration.  Critical care was time spent personally by me on the following activities: development of treatment plan with patient and/or surrogate as well as nursing, discussions with consultants, evaluation of patient's response to treatment, examination of patient, obtaining history from patient or surrogate, ordering and performing treatments and interventions, ordering and review of laboratory studies, ordering and review of  radiographic studies, pulse oximetry and re-evaluation of patient's condition.   No diagnosis found.  1. NSTEMI 2. Upper GI bleeding 3. Marked anemia secondary to #2 4. HCAP  MDM  He presents on 4L O2 with saturation of 88% which increased to 100% on NRB. Dr. Patria Mane made aware of patient's overall condition. The patient's chest pain is persistent despite NTG. EKG nonacute, nonspecific changes. He is significantly pale and symptoms initially felt likely secondary to marked anemia with h/o melena and previous history.  Guaiac positive stool and hgb of 7.2. Transfusion ordered. Troponin also found to be significantly elevated. VSS. Attempt made to maintain O2 saturation on [place] but patient again dropped O2 sat to mid-80's and NRB started again. Patient currently comfortable. Admitted to Critical Care.         Arnoldo Hooker, PA-C 10/19/12 2354

## 2012-10-20 ENCOUNTER — Encounter (HOSPITAL_COMMUNITY): Payer: Self-pay | Admitting: Physician Assistant

## 2012-10-20 DIAGNOSIS — N186 End stage renal disease: Secondary | ICD-10-CM

## 2012-10-20 DIAGNOSIS — I214 Non-ST elevation (NSTEMI) myocardial infarction: Secondary | ICD-10-CM

## 2012-10-20 DIAGNOSIS — K922 Gastrointestinal hemorrhage, unspecified: Secondary | ICD-10-CM

## 2012-10-20 DIAGNOSIS — E119 Type 2 diabetes mellitus without complications: Secondary | ICD-10-CM

## 2012-10-20 DIAGNOSIS — J9600 Acute respiratory failure, unspecified whether with hypoxia or hypercapnia: Secondary | ICD-10-CM

## 2012-10-20 DIAGNOSIS — J189 Pneumonia, unspecified organism: Secondary | ICD-10-CM

## 2012-10-20 DIAGNOSIS — R739 Hyperglycemia, unspecified: Secondary | ICD-10-CM

## 2012-10-20 DIAGNOSIS — D649 Anemia, unspecified: Secondary | ICD-10-CM

## 2012-10-20 DIAGNOSIS — D62 Acute posthemorrhagic anemia: Secondary | ICD-10-CM

## 2012-10-20 DIAGNOSIS — N189 Chronic kidney disease, unspecified: Secondary | ICD-10-CM

## 2012-10-20 DIAGNOSIS — E1129 Type 2 diabetes mellitus with other diabetic kidney complication: Secondary | ICD-10-CM

## 2012-10-20 DIAGNOSIS — I249 Acute ischemic heart disease, unspecified: Secondary | ICD-10-CM

## 2012-10-20 DIAGNOSIS — A419 Sepsis, unspecified organism: Secondary | ICD-10-CM

## 2012-10-20 DIAGNOSIS — J9601 Acute respiratory failure with hypoxia: Secondary | ICD-10-CM

## 2012-10-20 LAB — URINE MICROSCOPIC-ADD ON

## 2012-10-20 LAB — BASIC METABOLIC PANEL
CO2: 24 mEq/L (ref 19–32)
Chloride: 92 mEq/L — ABNORMAL LOW (ref 96–112)
GFR calc non Af Amer: 14 mL/min — ABNORMAL LOW (ref >90)
Glucose, Bld: 175 mg/dL — ABNORMAL HIGH (ref 70–99)
Potassium: 3.7 mEq/L (ref 3.5–5.1)
Sodium: 131 mEq/L — ABNORMAL LOW (ref 135–145)

## 2012-10-20 LAB — IRON AND TIBC
Iron: 12 ug/dL — ABNORMAL LOW (ref 42–135)
UIBC: 367 ug/dL (ref 125–400)

## 2012-10-20 LAB — GLUCOSE, CAPILLARY

## 2012-10-20 LAB — PROCALCITONIN: Procalcitonin: 2.14 ng/mL

## 2012-10-20 LAB — URINALYSIS, ROUTINE W REFLEX MICROSCOPIC
Glucose, UA: NEGATIVE mg/dL
Leukocytes, UA: NEGATIVE
Protein, ur: 100 mg/dL — AB
Specific Gravity, Urine: 1.017 (ref 1.005–1.030)

## 2012-10-20 LAB — CBC
Hemoglobin: 8.2 g/dL — ABNORMAL LOW (ref 13.0–17.0)
MCHC: 33.5 g/dL (ref 30.0–36.0)
RBC: 3.28 MIL/uL — ABNORMAL LOW (ref 4.22–5.81)
WBC: 18.1 10*3/uL — ABNORMAL HIGH (ref 4.0–10.5)

## 2012-10-20 LAB — TROPONIN I
Troponin I: 7.13 ng/mL (ref <0.30)
Troponin I: 9.95 ng/mL (ref <0.30)

## 2012-10-20 MED ORDER — ACETAMINOPHEN 650 MG RE SUPP
650.0000 mg | Freq: Four times a day (QID) | RECTAL | Status: DC | PRN
  Administered 2012-10-20: 650 mg via RECTAL

## 2012-10-20 MED ORDER — NITROGLYCERIN IN D5W 200-5 MCG/ML-% IV SOLN
2.0000 ug/min | INTRAVENOUS | Status: DC
  Filled 2012-10-20: qty 250

## 2012-10-20 MED ORDER — FUROSEMIDE 10 MG/ML IJ SOLN: 40.0000 mg | Freq: Two times a day (BID) | INTRAMUSCULAR | Status: DC

## 2012-10-20 MED ORDER — SODIUM CHLORIDE 0.9 % IV SOLN
8.0000 mg/h | INTRAVENOUS | Status: DC
  Administered 2012-10-20: 8 mg/h via INTRAVENOUS
  Filled 2012-10-20 (×2): qty 80

## 2012-10-20 MED ORDER — AMIODARONE HCL IN DEXTROSE 360-4.14 MG/200ML-% IV SOLN
60.0000 mg/h | INTRAVENOUS | Status: AC
  Administered 2012-10-20 (×2): 60 mg/h via INTRAVENOUS
  Filled 2012-10-20 (×2): qty 200

## 2012-10-20 MED ORDER — AMIODARONE LOAD VIA INFUSION
150.0000 mg | Freq: Once | INTRAVENOUS | Status: AC
  Administered 2012-10-20: 150 mg via INTRAVENOUS
  Filled 2012-10-20: qty 83.34

## 2012-10-20 MED ORDER — METOPROLOL TARTRATE 1 MG/ML IV SOLN
2.5000 mg | Freq: Four times a day (QID) | INTRAVENOUS | Status: DC
  Administered 2012-10-20: 2.5 mg via INTRAVENOUS
  Filled 2012-10-20 (×5): qty 5

## 2012-10-20 MED ORDER — METOPROLOL TARTRATE 1 MG/ML IV SOLN
5.0000 mg | Freq: Four times a day (QID) | INTRAVENOUS | Status: DC
  Administered 2012-10-20 – 2012-10-21 (×3): 5 mg via INTRAVENOUS
  Filled 2012-10-20 (×5): qty 5

## 2012-10-20 MED ORDER — PIPERACILLIN-TAZOBACTAM IN DEX 2-0.25 GM/50ML IV SOLN
2.2500 g | Freq: Four times a day (QID) | INTRAVENOUS | Status: DC
  Administered 2012-10-20 – 2012-10-21 (×5): 2.25 g via INTRAVENOUS
  Filled 2012-10-20 (×7): qty 50

## 2012-10-20 MED ORDER — SODIUM CHLORIDE 0.9 % IV SOLN
80.0000 mg | Freq: Once | INTRAVENOUS | Status: AC
  Administered 2012-10-20: 80 mg via INTRAVENOUS
  Filled 2012-10-20: qty 80

## 2012-10-20 MED ORDER — CHLORHEXIDINE GLUCONATE 0.12 % MT SOLN
15.0000 mL | Freq: Two times a day (BID) | OROMUCOSAL | Status: DC
  Administered 2012-10-21 – 2012-10-23 (×5): 15 mL via OROMUCOSAL
  Filled 2012-10-20 (×6): qty 15

## 2012-10-20 MED ORDER — FUROSEMIDE 10 MG/ML IJ SOLN
80.0000 mg | Freq: Two times a day (BID) | INTRAMUSCULAR | Status: DC
  Administered 2012-10-20: 80 mg via INTRAVENOUS
  Filled 2012-10-20 (×3): qty 8

## 2012-10-20 MED ORDER — VANCOMYCIN HCL IN DEXTROSE 1-5 GM/200ML-% IV SOLN
1000.0000 mg | INTRAVENOUS | Status: DC
  Filled 2012-10-20: qty 200

## 2012-10-20 MED ORDER — SODIUM CHLORIDE 0.9 % IV SOLN: 8.0000 mg/h | INTRAVENOUS | Status: DC

## 2012-10-20 MED ORDER — LEVOFLOXACIN IN D5W 500 MG/100ML IV SOLN
500.0000 mg | INTRAVENOUS | Status: DC
  Administered 2012-10-22: 500 mg via INTRAVENOUS
  Filled 2012-10-20: qty 100

## 2012-10-20 MED ORDER — LEVOFLOXACIN IN D5W 750 MG/150ML IV SOLN
750.0000 mg | Freq: Once | INTRAVENOUS | Status: AC
  Administered 2012-10-20: 750 mg via INTRAVENOUS
  Filled 2012-10-20: qty 150

## 2012-10-20 MED ORDER — BIOTENE DRY MOUTH MT LIQD
15.0000 mL | Freq: Four times a day (QID) | OROMUCOSAL | Status: DC
  Administered 2012-10-21 – 2012-10-24 (×10): 15 mL via OROMUCOSAL

## 2012-10-20 MED ORDER — MORPHINE SULFATE 2 MG/ML IJ SOLN
INTRAMUSCULAR | Status: AC
  Filled 2012-10-20: qty 1

## 2012-10-20 MED ORDER — FUROSEMIDE 10 MG/ML IJ SOLN
160.0000 mg | Freq: Four times a day (QID) | INTRAVENOUS | Status: DC
  Administered 2012-10-20 – 2012-10-23 (×12): 160 mg via INTRAVENOUS
  Filled 2012-10-20 (×14): qty 16

## 2012-10-20 MED ORDER — INSULIN ASPART 100 UNIT/ML ~~LOC~~ SOLN
0.0000 [IU] | SUBCUTANEOUS | Status: DC
  Administered 2012-10-20 (×2): 2 [IU] via SUBCUTANEOUS
  Administered 2012-10-20 (×2): 1 [IU] via SUBCUTANEOUS
  Administered 2012-10-20: 2 [IU] via SUBCUTANEOUS
  Administered 2012-10-21: 20:00:00 via SUBCUTANEOUS
  Administered 2012-10-21: 1 [IU] via SUBCUTANEOUS
  Administered 2012-10-21 – 2012-10-22 (×9): 2 [IU] via SUBCUTANEOUS
  Administered 2012-10-23: 3 [IU] via SUBCUTANEOUS
  Administered 2012-10-23 (×2): 2 [IU] via SUBCUTANEOUS
  Administered 2012-10-23: 3 [IU] via SUBCUTANEOUS
  Administered 2012-10-23 (×2): 2 [IU] via SUBCUTANEOUS
  Administered 2012-10-24: 1 [IU] via SUBCUTANEOUS
  Administered 2012-10-24: 2 [IU] via SUBCUTANEOUS
  Administered 2012-10-24 (×2): 3 [IU] via SUBCUTANEOUS

## 2012-10-20 MED ORDER — MORPHINE SULFATE 2 MG/ML IJ SOLN
1.0000 mg | INTRAMUSCULAR | Status: DC | PRN
  Administered 2012-10-20 (×2): 1 mg via INTRAVENOUS
  Filled 2012-10-20 (×2): qty 1

## 2012-10-20 MED ORDER — METOPROLOL TARTRATE 1 MG/ML IV SOLN: 5.0000 mg | Freq: Four times a day (QID) | INTRAVENOUS | Status: DC

## 2012-10-20 MED ORDER — SODIUM CHLORIDE 0.9 % IV SOLN
8.0000 mg/h | INTRAVENOUS | Status: DC
  Filled 2012-10-20 (×3): qty 80

## 2012-10-20 MED ORDER — MORPHINE SULFATE 2 MG/ML IJ SOLN
2.0000 mg | INTRAMUSCULAR | Status: DC | PRN
  Administered 2012-10-20 – 2012-10-24 (×14): 2 mg via INTRAVENOUS
  Filled 2012-10-20 (×14): qty 1

## 2012-10-20 MED ORDER — PANTOPRAZOLE SODIUM 40 MG IV SOLR
40.0000 mg | Freq: Two times a day (BID) | INTRAVENOUS | Status: DC
  Administered 2012-10-20 – 2012-10-21 (×2): 40 mg via INTRAVENOUS
  Filled 2012-10-20 (×3): qty 40

## 2012-10-20 MED ORDER — VANCOMYCIN HCL 1000 MG IV SOLR
750.0000 mg | Freq: Once | INTRAVENOUS | Status: AC
  Administered 2012-10-20: 750 mg via INTRAVENOUS
  Filled 2012-10-20: qty 750

## 2012-10-20 MED ORDER — PANTOPRAZOLE SODIUM 40 MG PO TBEC: 40.0000 mg | DELAYED_RELEASE_TABLET | Freq: Every day | ORAL | Status: DC

## 2012-10-20 MED ORDER — AMIODARONE HCL IN DEXTROSE 360-4.14 MG/200ML-% IV SOLN
30.0000 mg/h | INTRAVENOUS | Status: DC
  Administered 2012-10-20 – 2012-10-23 (×7): 30 mg/h via INTRAVENOUS
  Filled 2012-10-20 (×12): qty 200

## 2012-10-20 NOTE — H&P (Signed)
PULMONARY  / CRITICAL CARE MEDICINE  Name: Nicholas [name] MRN: [id number] DOB: [birth year]    ADMISSION DATE:  10/19/2012 CONSULTATION DATE:  10/20/2012  REFERRING MD :  EDP  CHIEF COMPLAINT:  Dyspnea  HISTORY OF PRESENT ILLNESS:  72 yo with CKD in preparation for HD with burning chest pain and cough productive of yellow sputum x 2 days.  There was no hemoptysis.  Denies fever.  Reports black stool today.  No abdominal pain, nausea or vomiting. Recent admission in [place] with reportedly negative cardiac workup.  In ED:  Bilateral airspace disease, hypoxia requiring NRB, bilateral airspace disease, Troponin 6, Hb 7>>>6  >>> transfused 2U of PRBCs.  BRIEF PATIENT DESCRIPTION: 72 yo admitted with multifocal pneumonia, anemia and chest pain / elevated Troponin.  SIGNIFICANT EVENTS / STUDIES:  3/5  Admitted with elevated troponin in setting of pneumonia and acute anemia.  Hemoglobin of 7.2 >>> transfused 2U of PRBCs.  LINES / TUBES:  CULTURES: 3/4  Blood >>>  ANTIBIOTICS: Zosyn 3/4 >>> Vancomycin 3/4 >>> Levaquin 3/4 >>>   INTERVAL HISTORY: 10/20/2012: Hypoxemia with 100% oxygen on nonrebreather continues pulse ox is in the low 90s high 80s. Work of breathing is okay   VITAL SIGNS: Temp:  [97.9 F (36.6 C)-102.1 F (38.9 C)] 97.9 F (36.6 C) (03/05 0816) Pulse Rate:  [97-112] 110 (03/05 1000) Resp:  [17-26] 23 (03/05 1000) BP: (95-132)/(49-85) 108/75 mmHg (03/05 1000) SpO2:  [83 %-99 %] 94 % (03/05 1000) Weight:  [81.647 kg (180 lb)] 81.647 kg (180 lb) (03/05 0115) HEMODYNAMICS:   VENTILATOR SETTINGS:   INTAKE / OUTPUT: Intake/Output     03/04 0701 - 03/05 0700 03/05 0701 - 03/06 0700   Blood 410    Other  60   IV Piggyback 200    Total Intake(mL/kg) 610 (7.5) 60 (0.7)   Urine (mL/kg/hr)  595 (1.5)   Total Output   595   Net +610 -535          PHYSICAL EXAMINATION: General:  Appears less pale and more alert after transfusion and Lasix  Neuro:  Awake, alert,  cooperative HEENT:  PERRL, pail conjunctiva, dry membranes Cardiovascular:  RRR, no m/r/g Lungs:  Bilateral diminished air entry, few bibasilar rales Abdomen:  Soft, nontender, bowel sounds diminished Musculoskeletal:  Moves all extremities, no edema Skin:  Intact  LABS:  Recent Labs Lab 10/19/12 2101 10/19/12 2104 10/19/12 2105 10/19/12 2234 10/20/12 0320 10/20/12 0750  HGB 7.2*  --   --  6.5*  --  8.2*  WBC 19.0*  --   --   --   --  18.1*  PLT 372  --   --   --   --  286  NA 130*  --   --  131*  --  131*  K 3.7  --   --  3.7  --  3.7  CL 90*  --   --  95*  --  92*  CO2 22  --   --   --   --  24  GLUCOSE 215*  --   --  207*  --  175*  BUN 60*  --   --  62*  --  64*  CREATININE 3.89*  --   --  4.00*  --  4.00*  CALCIUM 9.8  --   --   --   --  9.1  AST 33  --   --   --   --   --  ALT 7  --   --   --   --   --   ALKPHOS 109  --   --   --   --   --   BILITOT 0.8  --   --   --   --   --   PROT 7.4  --   --   --   --   --   ALBUMIN 3.2*  --   --   --   --   --   APTT 31  --   --   --   --   --   INR 1.29  --   --   --   --   --   LATICACIDVEN  --   --  3.0*  --   --   --   TROPONINI  --  6.24*  --   --  11.51* 10.93*  PROCALCITON  --   --   --   --  2.14  --   PROBNP  --   --   --   --  38564.0*  --     Recent Labs Lab 10/19/12 2106 10/20/12 0349 10/20/12 0815  GLUCAP 214* 181* 164*   CXR:  3/4 >>>  Bilateral airspace disease, effusions  ASSESSMENT / PLAN:  PULMONARY  Recent Labs Lab 10/19/12 2234  TCO2 26    A: Acute hypoxemic respiratory failure. HCAP (recent admission) vs acute pulmonary edema. - 10/20/2012: Acute hypoxemic respiratory failure persists. Congestive heart failure etiology suspected. Work of breathing itself seems okay P:   Start noninvasive mechanical ventilation which is beneficial and congestive heart failure [place] SpO2>92 Supplemental oxygen Antibiotics / diuresis as below Trend CXR  CARDIOVASCULAR  Recent Labs Lab  10/20/12 0320  PROBNP 38564.0*     Recent Labs Lab 10/19/12 2104 10/20/12 0320 10/20/12 0750  TROPONINI 6.24* 11.51* 10.93*    A: ACS vs demand ischemia.  Suspected ischemic cardiomyopathy / acute systolic CHF.  H/o CAD / MI, HTN.  Statin intolerant. P:  Goal MAP>60 ASA / heparin contraindicated in setting of acute GI hemorrhage Unable to give statins - intolerant No indications for emergent cardiac cath, but will need this admission Metoprolol IV Hold Clonidine 2D echo in AM Cardiology consult in AM    RENAL  Recent Labs Lab 10/19/12 2101 10/19/12 2234 10/20/12 0750  NA 130* 131* 131*  K 3.7 3.7 3.7  CL 90* 95* 92*  CO2 22  --  24  GLUCOSE 215* 207* 175*  BUN 60* 62* 64*  CREATININE 3.89* 4.00* 4.00*  CALCIUM 9.8  --  9.1    A:  CKD, pre HD. P:   Trend BMP Increase Lasix one 60 mg IV every 6 hours Renal consult called  GASTROINTESTINAL A:  Suspected upper GI hemorrhage. Presented with melena P:   NPO Protonix gtt GI consult in AM  [place] G. I called HEMATOLOGIC  Recent Labs Lab 10/19/12 2101 10/19/12 2234 10/20/12 0750  HGB 7.2* 6.5* 8.2*  HCT 21.7* 19.0* 24.5*  WBC 19.0*  --  18.1*  PLT 372  --  286    A:  Acute blood loss anemia. P:  Being transfused 2U of PRBCs Goal Hb > 8 in setting of acute ischemia  Trend CBC SCDs  INFECTIOUS  Recent Labs Lab 10/19/12 2105 10/20/12 0320  LATICACIDVEN 3.0*  --   PROCALCITON  --  2.14    A:  Suspected HCAP. P:   Cultures and antibiotics  as above PCT  ENDOCRINE  A:  DM2.  Hyperglycemia. P:   SSI  NEUROLOGIC A:  No active issues. P:   Monitor  Global No family at bedside.  we'll start BiPAP    CRITICAL CARE:  The patient is critically ill with multiple organ systems failure and requires high complexity decision making for assessment and support, frequent evaluation and titration of therapies, application of advanced monitoring technologies and extensive interpretation of  multiple databases. Critical Care Time devoted to patient care services described in this note is 50 minutes.     Dr. Kalman Shan, M.D., [facility].C.P Pulmonary and Critical Care Medicine Staff Physician [facility] Pager: [phone], If no answer or between  15:00h - 7:00h: call [phone]  10/20/2012 11:58 AM

## 2012-10-20 NOTE — Progress Notes (Signed)
Nursing 0800 Patient reports inability to urinate.  RN and NT attempted to sit patient on the side of the bed to attempt to void, patient unable to void.  Bladder scan revealed 719cc of urine in bladder.  Dr. Marchelle Gearing made aware.  Order to place foley catheter, obtain UA and Urine culture.  Foley placed.  After placement, patient reports buring at tip of penis.  RN assessed and found no abnormalities,  RN cleaned site.  MD made aware.  At reassessment patient reports less discomfort.  Will continue to assess.

## 2012-10-20 NOTE — Progress Notes (Signed)
  Echocardiogram 2D Echocardiogram has been performed.  Georgian Co 10/20/2012, 10:06 AM

## 2012-10-20 NOTE — Op Note (Signed)
Synopsis of GI studies from [place] clinic.   EGD 09/17/2012:  For anemia, chest pain, dysphagia By Lutricia Feil Normal esophagus Erythematous mucosa in gastric fundus The duodenum was normal.  The exam was otherwise normal  Protonix 40 mg per day was added.   EGD in 12/2010 revealed gastritis that was H Pylori positive This was treated with AMoxil/Levaquin/Prevacid  No colonoscopy reports sent from [facility] clinic.   Jennye Moccasin

## 2012-10-20 NOTE — Progress Notes (Signed)
Nursing 1700 Dr. Vassie Loll made aware of patient's HR and rhythm.  Order to increase Lopressor to 5mg .  Will page Cardiology to inform.  Lopressor given as ordered.  Will continue to assess.

## 2012-10-20 NOTE — H&P (Addendum)
PULMONARY  / CRITICAL CARE MEDICINE  Name: Nicholas [name] MRN: [id number] DOB: [birth year]    ADMISSION DATE:  10/19/2012 CONSULTATION DATE:  10/20/2012  REFERRING MD :  EDP  CHIEF COMPLAINT:  Dyspnea  BRIEF PATIENT DESCRIPTION: 72 yo admitted with multifocal pneumonia, anemia and chest pain / elevated Troponin.  SIGNIFICANT EVENTS / STUDIES:  3/5  Admitted with elevated troponin in setting of pneumonia and acute anemia.  Hemoglobin of 7.2 >>> transfused 2U of PRBCs.  LINES / TUBES:  CULTURES: 3/4  Blood >>>  ANTIBIOTICS: Zosyn 3/4 >>> Vancomycin 3/4 >>> Levaquin 3/4 >>>  HISTORY OF PRESENT ILLNESS:  72 yo with CKD in preparation for HD with burning chest pain and cough productive of yellow sputum x 2 days.  There was no hemoptysis.  Denies fever.  Reports black stool today.  No abdominal pain, nausea or vomiting. Recent admission in [place] with reportedly negative cardiac workup.  In ED:  Bilateral airspace disease, hypoxia requiring NRB, bilateral airspace disease, Troponin 6, Hb 7>>>6  >>> transfused 2U of PRBCs.  PAST MEDICAL HISTORY :  Past Medical History  Diagnosis Date  . Renal disorder   . Coronary artery disease   . Diabetes mellitus without complication   . Hypertension   . Arthritis   . MI (myocardial infarction) 1992   Past Surgical History  Procedure Laterality Date  . Coronary stent placement    . Hip surgery      both hips  . Av fistula placement      Left arm   Prior to Admission medications   Medication Sig Start Date End Date Taking? Authorizing Provider  allopurinol (ZYLOPRIM) 100 MG tablet Take 100 mg by mouth daily.   Yes Historical Provider, MD  amLODipine (NORVASC) 5 MG tablet Take 5 mg by mouth daily.   Yes Historical Provider, MD  cloNIDine (CATAPRES) 0.1 MG tablet Take 0.1 mg by mouth 2 (two) times daily. For blood pressure   Yes Historical Provider, MD  fish oil-omega-3 fatty acids 1000 MG capsule Take 1 g by mouth daily.   Yes  Historical Provider, MD  furosemide (LASIX) 80 MG tablet Take 80 mg by mouth 2 (two) times daily.   Yes Historical Provider, MD  gabapentin (NEURONTIN) 300 MG capsule Take 600 mg by mouth 2 (two) times daily.    Yes Historical Provider, MD  pantoprazole (PROTONIX) 40 MG tablet Take 40 mg by mouth daily.   Yes Historical Provider, MD  PRESCRIPTION MEDICATION Insulin, please follow up.   Yes Historical Provider, MD  sevelamer carbonate (RENVELA) 800 MG tablet Take 800 mg by mouth 3 (three) times daily with meals.   Yes Historical Provider, MD  sodium bicarbonate 650 MG tablet Take 650 mg by mouth 2 (two) times daily.   Yes Historical Provider, MD  tamsulosin (FLOMAX) 0.4 MG CAPS Take 0.4 mg by mouth at bedtime.   Yes Historical Provider, MD  traMADol (ULTRAM) 50 MG tablet Take 50 mg by mouth every 6 (six) hours as needed for pain.   Yes Historical Provider, MD   Allergies  Allergen Reactions  . Statins Other (See Comments)    Muscle weakness    FAMILY HISTORY:  No family history on file.  SOCIAL HISTORY:  reports that he has quit smoking. His smoking use included Cigarettes. He smoked 0.00 packs per day. He does not have any smokeless tobacco history on file. He reports that  drinks alcohol. He reports that he does not use illicit drugs.  REVIEW OF SYSTEMS:  12 points negative, except as in HPI.  INTERVAL HISTORY:  VITAL SIGNS: Temp:  [98 F (36.7 C)-102.1 F (38.9 C)] 98.7 F (37.1 C) (03/05 0243) Pulse Rate:  [97-112] 104 (03/05 0200) Resp:  [17-26] 25 (03/05 0200) BP: (100-132)/(49-85) 126/85 mmHg (03/05 0200) SpO2:  [83 %-99 %] 95 % (03/05 0200) Weight:  [81.647 kg (180 lb)] 81.647 kg (180 lb) (03/05 0115) HEMODYNAMICS:   VENTILATOR SETTINGS:   INTAKE / OUTPUT: Intake/Output     03/04 0701 - 03/05 0700   Blood 62.5   Total Intake(mL/kg) 62.5 (0.8)   Net +62.5         PHYSICAL EXAMINATION: General:  Appears acutely ill, pale Neuro:  Awake, alert, cooperative HEENT:   PERRL, pail conjunctiva, dry membranes Cardiovascular:  RRR, no m/r/g Lungs:  Bilateral diminished air entry, few bibasilar rales Abdomen:  Soft, nontender, bowel sounds diminished Musculoskeletal:  Moves all extremities, no edema Skin:  Intact  LABS:  Recent Labs Lab 10/19/12 2101 10/19/12 2104 10/19/12 2105 10/19/12 2234  HGB 7.2*  --   --  6.5*  WBC 19.0*  --   --   --   PLT 372  --   --   --   NA 130*  --   --  131*  K 3.7  --   --  3.7  CL 90*  --   --  95*  CO2 22  --   --   --   GLUCOSE 215*  --   --  207*  BUN 60*  --   --  62*  CREATININE 3.89*  --   --  4.00*  CALCIUM 9.8  --   --   --   AST 33  --   --   --   ALT 7  --   --   --   ALKPHOS 109  --   --   --   BILITOT 0.8  --   --   --   PROT 7.4  --   --   --   ALBUMIN 3.2*  --   --   --   APTT 31  --   --   --   INR 1.29  --   --   --   LATICACIDVEN  --   --  3.0*  --   TROPONINI  --  6.24*  --   --     Recent Labs Lab 10/19/12 2106  GLUCAP 214*   CXR:  3/4 >>>  Bilateral airspace disease, effusions  ASSESSMENT / PLAN:  PULMONARY A: Acute hypoxemic respiratory failure. HCAP (recent admission) vs acute pulmonary edema. P:   Gaol SpO2>92 Supplemental oxygen Antibiotics / diuresis as below Trend CXR  CARDIOVASCULAR A: ACS vs demand ischemia.  Suspected ischemic cardiomyopathy / acute systolic CHF.  H/o CAD / MI, HTN.  Statin intolerant. P:  Goal MAP>60 ASA / heparin contraindicated in setting of acute GI hemorrhage Unable to give statins - intolerant No indications for emergent cardiac cath, but will need this admission Metoprolol IV Hold Clonidine 2D echo in AM Cardiology consult in AM (for cath)  RENAL A:  CKD, pre HD. P:   Trend BMP Lasix 80 IV q12h x 4  GASTROINTESTINAL A:  Suspected upper GI hemorrhage. P:   NPO Protonix gtt GI consult in AM  (for EGD)  HEMATOLOGIC A:  Acute blood loss anemia. P:  Being transfused 2U of PRBCs Goal Hb > 8 in  setting of acute ischemia   Trend CBC SCDs  INFECTIOUS A:  Suspected HCAP. P:   Cultures and antibiotics as above PCT  ENDOCRINE  A:  DM2.  Hyperglycemia. P:   SSI  NEUROLOGIC A:  No active issues. P:   Monitor  TODAY'S SUMMARY: 72 yo with CAD admitted with chest pain / elevated troponin, bilateral airspace disease and acute blood loss anemia secondary to suspected upper GI hemorrhage.  HCAP vs pulmonary edema.  Demand ischemia vs ACS.  Tonight: Protonix, abx, Lasix, PRBCs.  Goal Hb >8.  Trend troponin.  In AM:  Cardiology consult (cath) and GI consult (EGD).  I have personally obtained a history, examined the patient, evaluated laboratory and imaging results, formulated the assessment and plan and placed orders.  CRITICAL CARE:  The patient is critically ill with multiple organ systems failure and requires high complexity decision making for assessment and support, frequent evaluation and titration of therapies, application of advanced monitoring technologies and extensive interpretation of multiple databases. Critical Care Time devoted to patient care services described in this note is 45 minutes.   Lonia Farber, MD Pulmonary and Critical Care Medicine [facility] Pager: [phone]  10/20/2012, 2:55 AM

## 2012-10-20 NOTE — Progress Notes (Signed)
  Pt axillary temp 101.9. Pt complaining of chills.   [facility] notified. Dr. Vassie Loll gave order for 650mg  acetaminophen  suppository q6hr prn.  Will continue to monitor.  Oletta Cohn RN

## 2012-10-20 NOTE — Progress Notes (Signed)
Nursing 1700 Dr. Eden Emms made aware that patient went into afib with HR 130-150s.  Order to start Amiodarone gtt and give bolus.  Orders placed.  Patient's BP low, 88/66 (70). Dr. Vassie Loll made aware.  Ok to start Amiodarone bolus and gtt with low BP.  BP monitored during bolus and infusion, see doc flowsheets for values.  MD made aware that patient needs an additional IV access and RN attempted x2 and IV team unable to place PIV.  Protonix gtt discontinued.

## 2012-10-20 NOTE — Progress Notes (Signed)
eLink Physician-Brief Progress Note Patient Name: Nicholas [name] DOB: 1940-09-15 MRN: [id number]  Date of Service  10/20/2012   HPI/Events of Note   Out of access now that amio gtt added Reviewed GI note  eICU Interventions  Dc protonix gtt- change to 40 bid   Intervention Category Intermediate Interventions: Medication change / dose adjustment  [name],[name] 10/20/2012, 6:20 PM

## 2012-10-20 NOTE — Progress Notes (Signed)
Patient ID: Nicholas [name], male   DOB: [birth year], 72 y.o.   MRN: [id number]    Subjective:  Lethargic and tachypnic  Objective:  Filed Vitals:   10/20/12 0400 10/20/12 0500 10/20/12 0600 10/20/12 0700  BP: 122/77 107/71 111/83 95/58  Pulse: 103 103 104 103  Temp:      TempSrc:      Resp: 24 18 23 25   Height:      Weight:      SpO2: 96% 96% 94% 94%    Intake/Output from previous day:  Intake/Output Summary (Last 24 hours) at 10/20/12 0737 Last data filed at 10/20/12 0459  Gross per 24 hour  Intake    610 ml  Output      0 ml  Net    610 ml    Physical Exam: Affect appropriate Pale male looks older than stated age HEENT: normal Neck supple with no adenopathy JVP normal no bruits no thyromegaly Lungs Diffuse wheezing Heart:  S1/S2 no murmur, no rub, gallop or click PMI normal Abdomen: benighn, BS positve, no tenderness, no AAA no bruit.  No HSM or HJR Distal pulses intact with no bruits No edema Neuro non-focal Skin warm and dry No muscular weakness   Lab Results: Basic Metabolic Panel:  Recent Labs  11/91/47 2101 10/19/12 2234  NA 130* 131*  K 3.7 3.7  CL 90* 95*  CO2 22  --   GLUCOSE 215* 207*  BUN 60* 62*  CREATININE 3.89* 4.00*  CALCIUM 9.8  --    Liver Function Tests:  Recent Labs  10/19/12 2101  AST 33  ALT 7  ALKPHOS 109  BILITOT 0.8  PROT 7.4  ALBUMIN 3.2*   No results found for this basename: LIPASE, AMYLASE,  in the last 72 hours CBC:  Recent Labs  10/19/12 2101 10/19/12 2234  WBC 19.0*  --   NEUTROABS 16.4*  --   HGB 7.2* 6.5*  HCT 21.7* 19.0*  MCV 71.4*  --   PLT 372  --    Cardiac Enzymes:  Recent Labs  10/19/12 2104 10/20/12 0320  TROPONINI 6.24* 11.51*    Imaging: Dg Chest 2 View  10/19/2012  *RADIOLOGY REPORT*  Clinical Data: Chest pain and shortness of breath.  CHEST - 2 VIEW  Comparison: None.  Findings: There are extensive bilateral airspace disease and small pleural effusions.  No pneumothorax is  identified.  There is cardiomegaly.  IMPRESSION: Extensive bilateral airspace disease with pleural effusions could be due to multi focal pneumonia or pulmonary edema.   Original Report Authenticated By: Holley Dexter, M.D.     Cardiac Studies:  ECG:  SR rate 103 normal ECG   Telemetry: SR Sinus tachycardia no arrhythmia  Echo: pending  Medications:   . furosemide  80 mg Intravenous Q12H  . insulin aspart  0-9 Units Subcutaneous Q4H  . [START ON 10/22/2012] levofloxacin (LEVAQUIN) IV  500 mg Intravenous Q48H  . metoprolol  2.5 mg Intravenous Q6H  . piperacillin-tazobactam (ZOSYN)  IV  2.25 g Intravenous Q6H  . [START ON 10/21/2012] vancomycin  1,000 mg Intravenous Q48H     . nitroGLYCERIN Stopped (10/20/12 0527)  . pantoprozole (PROTONIX) infusion      Assessment/Plan:  Dyspnea: with elevated BNP  ECG normal troponin nondiagnostic due to renal failure.  Continue lasix Consider initiation of dialysis Echo pending Cover for pneumonia with broad spectrum antibiotics Tachycardia:  Likely from anemia and comrbidities.  Long term anranasp for anemia RF:  Nephrology to see  may need dialysis if unable to diurese with iv lasix  Charlton Haws 10/20/2012, 7:37 AM

## 2012-10-20 NOTE — Consult Note (Addendum)
Nicholas [name] is an 72 y.o. male referred by Dr Dalbert Mayotte   Chief Complaint: CKD, pulm edema HPI: 72 yo WM with CKD 4 followed by Dr Thedore Mins in [place] admitted last night for CP and SOB.  CXR shows significant pulm edema.  Denies any overt uremic sxs.  Had fistula placed about one year ago.  Says renal fx about "16%".  Baseline SCr 3.3 from 12/13  Past Medical History  Diagnosis Date  . Renal disorder   . Coronary artery disease   . Diabetes mellitus without complication   . Hypertension   . Arthritis   . MI (myocardial infarction) 1992    Past Surgical History  Procedure Laterality Date  . Coronary stent placement    . Hip surgery      both hips  . Av fistula placement      Left arm    No family history on file. Social History:  reports that he has quit smoking. His smoking use included Cigarettes. He smoked 0.00 packs per day. He does not have any smokeless tobacco history on file. He reports that  drinks alcohol. He reports that he does not use illicit drugs.  Allergies:  Allergies  Allergen Reactions  . Statins Other (See Comments)    Muscle weakness     Medications Prior to Admission  Medication Sig Dispense Refill  . allopurinol (ZYLOPRIM) 100 MG tablet Take 100 mg by mouth daily.      [name] amLODipine (NORVASC) 5 MG tablet Take 5 mg by mouth daily.      . cloNIDine (CATAPRES) 0.1 MG tablet Take 0.1 mg by mouth 2 (two) times daily. For blood pressure      . fish oil-omega-3 fatty acids 1000 MG capsule Take 1 g by mouth daily.      . furosemide (LASIX) 80 MG tablet Take 80 mg by mouth 2 (two) times daily.      [name] gabapentin (NEURONTIN) 300 MG capsule Take 600 mg by mouth 2 (two) times daily.       . insulin aspart protamine-insulin aspart (NOVOLOG 70/30) (70-30) 100 UNIT/ML injection Inject 2-10 Units into the skin 3 (three) times daily as needed (Per sliding scale, based on sugar levels).      . insulin glargine (LANTUS) 100 UNIT/ML injection Inject 15 Units into the  skin at bedtime.      . pantoprazole (PROTONIX) 40 MG tablet Take 40 mg by mouth daily.      . sevelamer carbonate (RENVELA) 800 MG tablet Take 800 mg by mouth 3 (three) times daily with meals.      . sodium bicarbonate 650 MG tablet Take 650 mg by mouth 2 (two) times daily.      . tamsulosin (FLOMAX) 0.4 MG CAPS Take 0.4 mg by mouth at bedtime.      . traMADol (ULTRAM) 50 MG tablet Take 50 mg by mouth every 6 (six) hours as needed for pain.         Lab Results: UA: 100 mg/protein   Recent Labs  10/19/12 2101 10/19/12 2234 10/20/12 0750  WBC 19.0*  --  18.1*  HGB 7.2* 6.5* 8.2*  HCT 21.7* 19.0* 24.5*  PLT 372  --  286   BMET  Recent Labs  10/19/12 2101 10/19/12 2234 10/20/12 0750  NA 130* 131* 131*  K 3.7 3.7 3.7  CL 90* 95* 92*  CO2 22  --  24  GLUCOSE 215* 207* 175*  BUN 60* 62* 64*  CREATININE  3.89* 4.00* 4.00*  CALCIUM 9.8  --  9.1   LFT  Recent Labs  10/19/12 2101  PROT 7.4  ALBUMIN 3.2*  AST 33  ALT 7  ALKPHOS 109  BILITOT 0.8   Dg Chest 2 View  10/19/2012  *RADIOLOGY REPORT*  Clinical Data: Chest pain and shortness of breath.  CHEST - 2 VIEW  Comparison: None.  Findings: There are extensive bilateral airspace disease and small pleural effusions.  No pneumothorax is identified.  There is cardiomegaly.  IMPRESSION: Extensive bilateral airspace disease with pleural effusions could be due to multi focal pneumonia or pulmonary edema.   Original Report Authenticated By: Holley Dexter, M.D.     ROS: + SOB No CP currently No abd pain.  Some constipation Chronic pain in elbows No neuropathic CO No vision changes No dysuria  PHYSICAL EXAM: Blood pressure 115/76, pulse 106, temperature 97.9 F (36.6 C), temperature source Oral, resp. rate 25, height 5\' 6"  (1.676 m), weight 81.647 kg (180 lb), SpO2 89.00%. HEENT: PERRLA EOMI  Non rebreather in place NECK:Mild JVD LUNGS:bil crackles CARDIAC:RRR wo MRG ABD:+ BS NTND EXT:no edema  Lt forearm AVF +  bruit.  Large bursal sac Rt elbow NEURO:CNI M&SI ox3 no asterixis  Assessment: 1. Acute pulm edema 2. CP 3. CKD 4 4. HTN 5. Gout 6. ?NSTEMI PLAN: 1. IV lasix 160mg  q 6hr 2. If unable to diurese then will need HD 3. Will get info from his primary nephrologist 4. 4. Check pth 5. Check iron levels 6. May need epo   MATTINGLY,MICHAEL T 10/20/2012, 12:14 PM

## 2012-10-20 NOTE — Consult Note (Signed)
Admit date: 10/19/2012 Referring Physician  Dr. Marin Shutter Primary Cardiologist  NONE Reason for Consultation  Elevated troponin  HPI: 72 yo with CKD in preparation for HD to start soon who complained of burning chest pain.  Apparently he has been having burning upper abdominal, epigastric and lower midsternal chest burning for several weeks and was seen By Cardiology in [place] and a stress test was done which the patient says was normal.  Initially his chest burning was episodic and improved with antacids but now has been constant for a few days.  He also has noticed DOE since Saturday. He had been having cough productive of yellow sputum for 2 days.  There was no hemoptysis. Denies fever. Reports black stool today. No abdominal pain, nausea or vomiting.In ED: Bilateral airspace disease, hypoxia requiring NRB, bilateral airspace disease, Troponin 6, Hb 7 >>> transfused 2U of PRBCs.  Cardiology is now asked to consult for further evaluation of chest pain.  Currently he complains of 8/10 pleuritic CP that occurs mainly when he is taking a deep breath in.  He was given SL NTG without any improvement in CP but morphine helped.    PMH:   Past Medical History  Diagnosis Date  . Chronic kidney disease getting ready to start HD   . Coronary artery disease s/p PCI remotely in 1992   . Diabetes mellitus without complication   . Hypertension   . Arthritis    Carotid artery stenosis - scheduled for CEA in [place] next week   . MI (myocardial infarction)  1992     PSH:   Past Surgical History  Procedure Laterality Date  . Coronary stent placement    . Hip surgery      both hips  . Av fistula placement      Left arm    Allergies:  Statins Prior to Admit Meds:   Prescriptions prior to admission  Medication Sig Dispense Refill  . allopurinol (ZYLOPRIM) 100 MG tablet Take 100 mg by mouth daily.      [name] amLODipine (NORVASC) 5 MG tablet Take 5 mg by mouth daily.      . cloNIDine (CATAPRES)  0.1 MG tablet Take 0.1 mg by mouth 2 (two) times daily. For blood pressure      . fish oil-omega-3 fatty acids 1000 MG capsule Take 1 g by mouth daily.      . furosemide (LASIX) 80 MG tablet Take 80 mg by mouth 2 (two) times daily.      [name] gabapentin (NEURONTIN) 300 MG capsule Take 600 mg by mouth 2 (two) times daily.       . pantoprazole (PROTONIX) 40 MG tablet Take 40 mg by mouth daily.      [name] PRESCRIPTION MEDICATION Insulin, please follow up.      . sevelamer carbonate (RENVELA) 800 MG tablet Take 800 mg by mouth 3 (three) times daily with meals.      . sodium bicarbonate 650 MG tablet Take 650 mg by mouth 2 (two) times daily.      . tamsulosin (FLOMAX) 0.4 MG CAPS Take 0.4 mg by mouth at bedtime.      . traMADol (ULTRAM) 50 MG tablet Take 50 mg by mouth every 6 (six) hours as needed for pain.       Fam HX:   No family history on file. Social HX:    History   Social History  . Marital Status: Married    Spouse Name: N/A    Number of  Children: N/A  . Years of Education: N/A   Occupational History  . Not on file.   Social History Main Topics  . Smoking status: Former Smoker    Types: Cigarettes  . Smokeless tobacco: Not on file  . Alcohol Use: Yes     Comment: occasionally  . Drug Use: No  . Sexually Active: Not on file   Other Topics Concern  . Not on file   Social History Narrative  . No narrative on file     ROS:  All 11 ROS were addressed and are negative except what is stated in the HPI  Physical Exam: Blood pressure 118/72, pulse 102, temperature 98.7 F (37.1 C), temperature source Oral, resp. rate 22, height 5\' 6"  (1.676 m), weight 81.647 kg (180 lb), SpO2 98.00%.    General: Well developed, well nourished, in no acute distress Head: Eyes PERRLA, No xanthomas.   Normal cephalic and atramatic  Lungs:   Crackles about 1/3 way up bilaterally Heart:   HRRR S1 S2 Pulses are 2+ & equal.            No carotid bruit. No JVD.  No abdominal bruits. No femoral  bruits. Abdomen: Bowel sounds are positive, abdomen soft and non-tender without masses or                  Hernia's noted. Msk:  Back normal, normal gait. Normal strength and tone for age. Extremities:   No clubbing, cyanosis or edema.  DP +1 Neuro: Alert and oriented X 3. Psych:  Good affect, responds appropriately    Labs:   Lab Results  Component Value Date   WBC 19.0* 10/19/2012   HGB 6.5* 10/19/2012   HCT 19.0* 10/19/2012   MCV 71.4* 10/19/2012   PLT 372 10/19/2012    Recent Labs Lab 10/19/12 2101 10/19/12 2234  NA 130* 131*  K 3.7 3.7  CL 90* 95*  CO2 22  --   BUN 60* 62*  CREATININE 3.89* 4.00*  CALCIUM 9.8  --   PROT 7.4  --   BILITOT 0.8  --   ALKPHOS 109  --   ALT 7  --   AST 33  --   GLUCOSE 215* 207*   No results found for this basename: PTT   Lab Results  Component Value Date   INR 1.29 10/19/2012   Lab Results  Component Value Date   TROPONINI 6.24* 10/19/2012         Radiology:  Dg Chest 2 View  10/19/2012  *RADIOLOGY REPORT*  Clinical Data: Chest pain and shortness of breath.  CHEST - 2 VIEW  Comparison: None.  Findings: There are extensive bilateral airspace disease and small pleural effusions.  No pneumothorax is identified.  There is cardiomegaly.  IMPRESSION: Extensive bilateral airspace disease with pleural effusions could be due to multi focal pneumonia or pulmonary edema.   Original Report Authenticated By: Holley Dexter, M.D.     EKG:  Sinus tachcyardia  ASSESSMENT:  1.  NSTEMI secondary to demand ischemia secondary to severe anemia and PNA.  He has chronic renal disease so Troponin may be elevated out of proportion to MI.  He is having CP but it is pleuritic and most likely from his PNA.  He also has burning chest pain that sounds more GI and is improved with antacids.  The pain he currently complains of is pleuritic./ 2.   Acute respiratory failure secondary to PNA although chest xray ? For CHF as  well.  Given marked anemia he may have acute  diastolic CHF secondary to anemia/PNA and renal failure. 3.  Bilateral PNA 4.  Severe anemia secondary to GI bleed 5.  H/O CAD with remote MI and PCI 1992 with recent nuclear stress test normal per patient 6.  Carotid artery stenosis - scheduled for CEA next week in [place] 7.  Chronic kidney disease nearing HD 8.  HTN 9.  DM 10.  tachcyardia secondary to anemia and PNA  PLAN:   1.  Continue to cycle cardiac enzymes 2.  Check BNP 3.  Check 2D echo to eval LVF 4.  Will need to get report of nuclear stress test done at [facility] hospital 5.  Start Lopressor 2.5mg  IV q6 hours - currently NPO 6.  Agree with IV Lasix for now 5.  No anticoagulation (ASA included) secondary to GI bleed  Quintella Reichert, MD  10/20/2012  3:20 AM

## 2012-10-20 NOTE — Progress Notes (Signed)
ANTIBIOTIC CONSULT NOTE - INITIAL  Pharmacy Consult for vancomycin, Zosyn, levofloxacin Indication: rule out pneumonia  Allergies  Allergen Reactions  . Statins Other (See Comments)    Muscle weakness     Patient Measurements: Height: 5\' 6"  (167.6 cm) Weight: 180 lb (81.647 kg) (Per patient report) IBW/kg (Calculated) : 63.8  Vital Signs: Temp: 98.8 F (37.1 C) (03/05 0055) Temp src: Oral (03/05 0055) BP: 114/49 mmHg (03/05 0115) Pulse Rate: 102 (03/05 0115) Intake/Output from previous day: 03/04 0701 - 03/05 0700 In: 50 [Blood:50] Out: -  Intake/Output from this shift: Total I/O In: 50 [Blood:50] Out: -   Labs:  Recent Labs  10/19/12 2101 10/19/12 2234  WBC 19.0*  --   HGB 7.2* 6.5*  PLT 372  --   CREATININE 3.89* 4.00*   Estimated Creatinine Clearance: 17.5 ml/min (by C-G formula based on Cr of 4). No results found for this basename: VANCOTROUGH, VANCOPEAK, VANCORANDOM, GENTTROUGH, GENTPEAK, GENTRANDOM, TOBRATROUGH, TOBRAPEAK, TOBRARND, AMIKACINPEAK, AMIKACINTROU, AMIKACIN,  in the last 72 hours   Microbiology: No results found for this or any previous visit (from the past 720 hour(s)).  Medical History: Past Medical History  Diagnosis Date  . Renal disorder   . Coronary artery disease   . Diabetes mellitus without complication   . Hypertension   . Arthritis   . MI (myocardial infarction) 1992    Medications:  Scheduled:  . [COMPLETED] acetaminophen  650 mg Oral Once  . metoprolol  5 mg Intravenous Q6H  . [COMPLETED]  morphine injection  4 mg Intravenous Once   Assessment: 72 yo male with h/o CKD admitted with possible GI bleed and r/o pneumonia. Pharmacy to manage Zosyn, vancomycin, and levofloxacin. Patient has already received vancomycin 1gm IV x 1 and Zosyn 3.375gm IV x 1.   Goal of Therapy:  Vancomycin trough level 15-20 mcg/ml  Plan:  1. Zosyn 2.25gm IV Q6H (30 min infusion) 2. Vancomycin 750mg  IV x 1 (total of 1.75gm loading dose),  then 1gm IV Q48H.  3. Levofloxacin 750mg  IV x 1, then 500mg  IV Q48H.   Emeline Gins 10/20/2012,1:51 AM

## 2012-10-20 NOTE — Progress Notes (Signed)
Chest pain   Nitro ordered;

## 2012-10-20 NOTE — Consult Note (Signed)
[facility] consult note   Primary Care Physician:  Dr Graciela Husbands in [place].  Primary Gastroenterologist:  Dr. [name]  In [place] Renal:  Dr Ella Jubilee in [place]   CHIEF COMPLAINT:  Microcytic anemia.  FOB + dark stools.   HPI: Nicholas [name] is a 72 y.o. male.  Has IDDM and hx CHF, CAD.  Has hx CHF and tells me he was admitted at [facility] for CHF in early Feb 2014.  Underwent EGD at the time for anemia, had been having dark stools for a few weeks too.  I await [facility] records.  No colonoscopy or CE pursued.  Last had colonoscopy 5 or more years ago. Started on Protonix once daily.  Received transfusions then as well as on one or 2 other occasions in last 12 months. Has never been started on Epogen etc. Now admitted with Hgb of 6.5. Non STEMI, CHF and pneumonia.  He is still passing dark stools which test FOB +.  No nausea, dysphagia, abd pain, anorexia.  Required TUMS cause constipation.  No blood per rectum.  No NSAIDs or ASA.  No po Iron or IV iron PTA. Patient and son states that he has had some bloody stools over the past month.  S/p 2 PRBCs.   May need to start dialysis, already has AVG in place.  He is on a lot of ESRD meds.    Past Medical History  Diagnosis Date  . Renal disorder   . Coronary artery disease   . Diabetes mellitus without complication   . Hypertension   . Arthritis   . MI (myocardial infarction) 1992    Past Surgical History  Procedure Laterality Date  . Coronary stent placement    . Hip surgery      both hips  . Av fistula placement      Left arm    Prior to Admission medications   Medication Sig Start Date End Date Taking? Authorizing Provider  allopurinol (ZYLOPRIM) 100 MG tablet Take 100 mg by mouth daily.   Yes Historical Provider, MD  amLODipine (NORVASC) 5 MG tablet Take 5 mg by mouth daily.   Yes Historical Provider, MD  cloNIDine (CATAPRES) 0.1 MG tablet Take 0.1 mg by mouth 2 (two)  times daily. For blood pressure   Yes Historical Provider, MD  fish oil-omega-3 fatty acids 1000 MG capsule Take 1 g by mouth daily.   Yes Historical Provider, MD  furosemide (LASIX) 80 MG tablet Take 80 mg by mouth 2 (two) times daily.   Yes Historical Provider, MD  gabapentin (NEURONTIN) 300 MG capsule Take 600 mg by mouth 2 (two) times daily.    Yes Historical Provider, MD  insulin aspart protamine-insulin aspart (NOVOLOG 70/30) (70-30) 100 UNIT/ML injection Inject 2-10 Units into the skin 3 (three) times daily as needed (Per sliding scale, based on sugar levels).   Yes Historical Provider, MD  insulin glargine (LANTUS) 100 UNIT/ML injection Inject 15 Units into the skin at bedtime.   Yes Historical Provider, MD  pantoprazole (PROTONIX) 40 MG tablet Take 40 mg by mouth daily.   Yes Historical Provider, MD  sevelamer carbonate (RENVELA) 800 MG tablet Take 800 mg by mouth 3 (three) times daily with meals.   Yes Historical Provider, MD  sodium bicarbonate 650 MG tablet Take 650 mg by mouth 2 (two) times daily.   Yes Historical Provider, MD  tamsulosin (FLOMAX) 0.4 MG CAPS Take 0.4 mg by mouth at bedtime.   Yes Historical Provider, MD  traMADol (ULTRAM) 50 MG tablet Take 50 mg by mouth every 6 (six) hours as needed for pain.   Yes Historical Provider, MD    Current Facility-Administered Medications  Medication Dose Route Frequency Provider Last Rate Last Dose  . furosemide (LASIX) injection 80 mg  80 mg Intravenous Q12H Lonia Farber, MD   80 mg at 10/20/12 0618  . insulin aspart (novoLOG) injection 0-9 Units  0-9 Units Subcutaneous Q4H Roxine Caddy, MD   2 Units at 10/20/12 [phone]  . [START ON 10/22/2012] levofloxacin (LEVAQUIN) IVPB 500 mg  500 mg Intravenous Q48H Lyanne Co, MD      . metoprolol (LOPRESSOR) injection 2.5 mg  2.5 mg Intravenous Q6H Quintella Reichert, MD      . morphine 2 MG/ML injection 2 mg  2 mg Intravenous Q1H PRN Roxine Caddy, MD   2 mg at 10/20/12 0447  .  nitroGLYCERIN 0.2 mg/mL in dextrose 5 % infusion  2-200 mcg/min Intravenous Titrated Roxine Caddy, MD      . pantoprazole (PROTONIX) 80 mg in sodium chloride 0.9 % 250 mL infusion  8 mg/hr Intravenous Continuous Shari A Upstill, PA-C      . piperacillin-tazobactam (ZOSYN) IVPB 2.25 g  2.25 g Intravenous Q6H Lyanne Co, MD   2.25 g at 10/20/12 0459  . [START ON 10/21/2012] vancomycin (VANCOCIN) IVPB 1000 mg/200 mL premix  1,000 mg Intravenous Q48H Lyanne Co, MD        Allergies as of 10/19/2012 - Review Complete 10/19/2012  Allergen Reaction Noted  . Statins Other (See Comments) 10/19/2012    No family history on file.  History   Social History  . Marital Status: Married    Spouse Name: N/A    Number of Children: N/A  . Years of Education: N/A   Occupational History  . Not on file.   Social History Main Topics  . Smoking status: Former Smoker    Types: Cigarettes  . Smokeless tobacco: Not on file  . Alcohol Use: Yes     Comment: occasionally  . Drug Use: No  . Sexually Active: Not on file   Other Topics Concern  . Not on file   Social History Narrative  . No narrative on file    REVIEW OF SYSTEMS: Constitutional:  No weight loss.  No falls ENT:  No nose bleeds Pulm:  SOB improved CV:  No chest pain GU:  No blood in urine GI:  Per HPI Heme:  Per HPI.    Transfusions:  Per HPI Neuro:  No headaches, seizures Derm:  No rash or sores.  No Pruritus.  Endocrine:  No excessive thirst Immunization:  Flu shot current Travel:  none   PHYSICAL EXAM:  Temp:  [97.9 F (36.6 C)-102.1 F (38.9 C)] 97.9 F (36.6 C) (03/05 0816) Pulse Rate:  [97-112] 110 (03/05 1000) Resp:  [17-26] 23 (03/05 1000) BP: (95-132)/(49-85) 108/75 mmHg (03/05 1000) SpO2:  [83 %-99 %] 94 % (03/05 1000) Weight:  [81.647 kg (180 lb)] 81.647 kg (180 lb) (03/05 0115)    General:  Pleasant elderly WM who is comfortable but dyspneic Ears:  HOH Mouth:  No lesions or exudates  Neck: no  mass.  Lungs:  Dyspneic with rebreather mask in place.    Heart:  RRR.  No MRG  Abdomen:  Soft, ND.  Slight right sided tenderness    Rectal:  Not done.  FOB + over nite  Msk:  Large bursa on  right elbow   Pulses:  2 plus pedal pulse, feet warm Extremities:  No CCE  Neurologic: no confusion.  No tremor.  Moves all 4s.  Skin:  pale Psych:  Peasant, cooperative.     Intake/Output from previous day: 03/04 0701 - 03/05 0700 In: 610 [Blood:410; IV Piggyback:200] Out: -  Intake/Output this shift: Total I/O In: 60 [Other:60] Out: 595 [Urine:595]  LAB RESULTS:  Recent Labs  10/19/12 2101 10/19/12 2234 10/20/12 0750  WBC 19.0*  --  18.1*  HGB 7.2* 6.5* 8.2*  HCT 21.7* 19.0* 24.5*  PLT 372  --  286   BMET  Recent Labs  10/19/12 2101 10/19/12 2234 10/20/12 0750  NA 130* 131* 131*  K 3.7 3.7 3.7  CL 90* 95* 92*  CO2 22  --  24  GLUCOSE 215* 207* 175*  BUN 60* 62* 64*  CREATININE 3.89* 4.00* 4.00*  CALCIUM 9.8  --  9.1   LFT  Recent Labs  10/19/12 2101  PROT 7.4  ALBUMIN 3.2*  AST 33  ALT 7  ALKPHOS 109  BILITOT 0.8   PT/INR  Recent Labs  10/19/12 2101  LABPROT 15.8*  INR 1.29    RADIOLOGY STUDIES: Dg Chest 2 View 10/19/2012   IMPRESSION: Extensive bilateral airspace disease with pleural effusions could be due to multi focal pneumonia or pulmonary edema.   Original Report Authenticated By: Holley Dexter, M.D.    IMPRESSION:   *  Microcytic anemia. FOB +.  S/p PRBCs x 2.  Chronic anemia that was also transfused in Feb admission at [facility].  EGD in Feb 2014, awaiting records.  *  Non STEMI.  Hx coronary stent.  *  CHF.  Admitted with same at [place] feb 2014 according to pt.  *  CAP *  IDDM  *  Predialysis chronic kidney disease. An AV fistula has been placed.  Suspect this is contributing to chronic disease anemia   PLAN: *  Get EGD and GI records.  Spoke with [place] [facility]: they will fax.  *  Stop the PPI drip. Heart healthy diet.       LOS: 1 day   Jennye Moccasin  10/20/2012, 11:15 AM Pager [phone]  Chart was reviewed and patient was examined. X-rays and lab were reviewed.    I agree with management and plans.  Patient has a chronic low loss anemia and probable subacute blood loss as well. Recent endoscopy medicates against upper GI bleeding source. With rectal bleeding but one must be concerned about a colonic bleeding source including polyps, AVMs or neoplasm.  In view of the recent MI and with congestive heart failure will defer endoscopic studies. He will eventually need a colonoscopy and possible capsule endoscopy.  Barbette Hair. Arlyce Dice, M.D., [facility] Cell [phone]

## 2012-10-20 NOTE — Progress Notes (Signed)
Nursing 1640 Patient has increased HR at 1629, HR went up to the 150s.  Patient sustained HR in the 130-150s, heart beat is irregular.  Patient denies pain and any discomfort.  Elink called and MD unavailable, phone number left for MD to return call.  Elink recalled and RN notified of patient's heart rate and rhythm.  RN to inform MD of changes and call with new orders. Patient continues to deny pain/discomfort.  Will continue to assess.

## 2012-10-20 NOTE — Progress Notes (Signed)
eLink Physician-Brief Progress Note Patient Name: Nicholas [name] DOB: 10-Feb-1941 MRN: [id number]  Date of Service  10/20/2012   HPI/Events of Note   New onset AF-RVR 140s  eICU Interventions  Increase lopressor 5 q6h - inform cards   Intervention Category Intermediate Interventions: Arrhythmia - evaluation and management  [name],[name] 10/20/2012, 5:16 PM

## 2012-10-20 NOTE — Progress Notes (Signed)
Nursing 1145 MD made aware that patient has O2 saturations from high 80s to low 90s on NRB at 100%.  Order to increase Lasix and place patient on Bipap. Renal consult placed.  Bipap placed and patient now at 100% on 70% oxygen.  Will continue to assess.

## 2012-10-21 ENCOUNTER — Inpatient Hospital Stay (HOSPITAL_COMMUNITY): Payer: Medicare Other

## 2012-10-21 DIAGNOSIS — I4891 Unspecified atrial fibrillation: Secondary | ICD-10-CM

## 2012-10-21 DIAGNOSIS — K922 Gastrointestinal hemorrhage, unspecified: Secondary | ICD-10-CM

## 2012-10-21 DIAGNOSIS — A419 Sepsis, unspecified organism: Secondary | ICD-10-CM

## 2012-10-21 LAB — GLUCOSE, CAPILLARY
Glucose-Capillary: 163 mg/dL — ABNORMAL HIGH (ref 70–99)
Glucose-Capillary: 183 mg/dL — ABNORMAL HIGH (ref 70–99)
Glucose-Capillary: 190 mg/dL — ABNORMAL HIGH (ref 70–99)

## 2012-10-21 LAB — CBC WITH DIFFERENTIAL/PLATELET
Basophils Absolute: 0 10*3/uL (ref 0.0–0.1)
Eosinophils Absolute: 0 10*3/uL (ref 0.0–0.7)
Eosinophils Relative: 0 % (ref 0–5)
HCT: 29.1 % — ABNORMAL LOW (ref 39.0–52.0)
Lymphocytes Relative: 6 % — ABNORMAL LOW (ref 12–46)
MCH: 25 pg — ABNORMAL LOW (ref 26.0–34.0)
MCV: 76.6 fL — ABNORMAL LOW (ref 78.0–100.0)
Monocytes Absolute: 1.9 10*3/uL — ABNORMAL HIGH (ref 0.1–1.0)
RDW: 17.4 % — ABNORMAL HIGH (ref 11.5–15.5)
WBC: 20.9 10*3/uL — ABNORMAL HIGH (ref 4.0–10.5)

## 2012-10-21 LAB — TYPE AND SCREEN
Antibody Screen: NEGATIVE
Unit division: 0

## 2012-10-21 LAB — URINE CULTURE: Colony Count: NO GROWTH

## 2012-10-21 LAB — PARATHYROID HORMONE, INTACT (NO CA): PTH: 195.6 pg/mL — ABNORMAL HIGH (ref 14.0–72.0)

## 2012-10-21 LAB — RENAL FUNCTION PANEL
Albumin: 2.8 g/dL — ABNORMAL LOW (ref 3.5–5.2)
Chloride: 91 mEq/L — ABNORMAL LOW (ref 96–112)
GFR calc non Af Amer: 12 mL/min — ABNORMAL LOW (ref >90)
Potassium: 3.8 mEq/L (ref 3.5–5.1)

## 2012-10-21 LAB — HEPATITIS B CORE ANTIBODY, TOTAL: Hep B Core Total Ab: NEGATIVE

## 2012-10-21 MED ORDER — PANTOPRAZOLE SODIUM 40 MG IV SOLR
40.0000 mg | INTRAVENOUS | Status: DC
  Administered 2012-10-22 – 2012-10-23 (×2): 40 mg via INTRAVENOUS
  Filled 2012-10-21 (×2): qty 40

## 2012-10-21 MED ORDER — SODIUM CHLORIDE 0.9 % IV SOLN: 100.0000 mL | INTRAVENOUS | Status: DC | PRN

## 2012-10-21 MED ORDER — NEPRO/CARBSTEADY PO LIQD
237.0000 mL | ORAL | Status: DC | PRN
  Filled 2012-10-21: qty 237

## 2012-10-21 MED ORDER — METOPROLOL TARTRATE 1 MG/ML IV SOLN
2.5000 mg | Freq: Four times a day (QID) | INTRAVENOUS | Status: DC
  Administered 2012-10-21 – 2012-10-23 (×7): 2.5 mg via INTRAVENOUS
  Filled 2012-10-21 (×8): qty 5

## 2012-10-21 MED ORDER — LIDOCAINE-PRILOCAINE 2.5-2.5 % EX CREA
1.0000 "application " | TOPICAL_CREAM | CUTANEOUS | Status: DC | PRN
  Filled 2012-10-21: qty 5

## 2012-10-21 MED ORDER — LIDOCAINE HCL (PF) 1 % IJ SOLN: 5.0000 mL | INTRAMUSCULAR | Status: DC | PRN

## 2012-10-21 MED ORDER — PENTAFLUOROPROP-TETRAFLUOROETH EX AERO: 1.0000 "application " | INHALATION_SPRAY | CUTANEOUS | Status: DC | PRN

## 2012-10-21 MED ORDER — ALTEPLASE 2 MG IJ SOLR
2.0000 mg | Freq: Once | INTRAMUSCULAR | Status: AC | PRN
  Filled 2012-10-21: qty 2

## 2012-10-21 MED ORDER — HEPARIN SODIUM (PORCINE) 1000 UNIT/ML DIALYSIS
1000.0000 [IU] | INTRAMUSCULAR | Status: DC | PRN
  Filled 2012-10-21: qty 1

## 2012-10-21 NOTE — Progress Notes (Signed)
RT placed patient on bipap 12/6, 60%. Patient is tolerating bipap well sat 96%, HR 113, RR 19, BP 101/78. RT will continue to monitor.

## 2012-10-21 NOTE — Progress Notes (Signed)
Nursing 1515 Dr. Caryn Section made aware that patient's UOP for the shift has been 153cc.  No new orders at this time.

## 2012-10-21 NOTE — Progress Notes (Signed)
PULMONARY  / CRITICAL CARE MEDICINE  Name: Nicholas [name] MRN: [id number] DOB: Mar 20, 1941    ADMISSION DATE:  10/19/2012 CONSULTATION DATE:  10/20/2012  REFERRING MD :  EDP  CHIEF COMPLAINT:  Dyspnea  Brief:  72 yo all prior care at [facility] hospital and no records at [place] . With CKD in preparation for HD with burning chest pain and cough productive of yellow sputum x 2 days.  There was no hemoptysis.  Denies fever.  Reports black stool today.  No abdominal pain, nausea or vomiting. Recent admission in [place] with reportedly negative cardiac workup.  In ED:  Bilateral airspace disease, hypoxia requiring NRB, bilateral airspace disease, Troponin 6, Hb 7>>>6  >>> transfused 2U of PRBCs.   LINES / TUBES: BiPAP 10/20/2012 >>  CULTURES: 3/4  Blood >>> 10/19/2012 MRSA PCR is negative    ANTIBIOTICS: Zosyn 3/4 >>> 10/21/2012 Vancomycin 3/4 >>> 10/21/2012 Levaquin 3/4 >>>    Recent Labs Lab 10/19/12 2105 10/20/12 0320 10/21/12 0533  LATICACIDVEN 3.0*  --   --   PROCALCITON  --  2.14 3.53     SIGNIFICANT EVENTS / STUDIES:  3/5  Admitted with elevated troponin in setting of pneumonia and acute anemia.  Hemoglobin of 7.2 >>> transfused 2U of PRBCs.  10/20/2012 repeat rounds: Hypoxemia with 100% oxygen on nonrebreather continues pulse ox is in the low 90s high 80s. Work of breathing is okay  10/20/2012: Echocardiogram The estimated ejection fraction was in the range of 35% to 40%. There is moderate hypokinesis of the entireinferior myocardium. Left ventricular diastolic function parameters were normal.     SUBJECTIVE/OVERNIGHT/INTERVAL HX 10/21/2012: Despite aggressive Lasix of 160 mg every 6 hours. His urine output is only 1400 cc and he is net 350 cc negative. His hypoxemia and acute pulmonary edema continue without change. He is on a 60% oxygen on BiPAP and saturating only 88%. However  work of breathing remains intact   VITAL SIGNS: Temp:  [97.9  F (36.6 C)-101.9 F (38.8 C)] 98.1 F (36.7 C) (03/06 0802) Pulse Rate:  [81-136] 119 (03/06 1033) Resp:  [17-34] 31 (03/06 1033) BP: (74-134)/(46-91) 110/49 mmHg (03/06 1033) SpO2:  [85 %-100 %] 89 % (03/06 1033) FiO2 (%):  [15 %-100 %] 15 % (03/06 1033) Weight:  [88.4 kg (194 lb 14.2 oz)] 88.4 kg (194 lb 14.2 oz) (03/06 0438) HEMODYNAMICS:   VENTILATOR SETTINGS: Vent Mode:  [-]  FiO2 (%):  [15 %-100 %] 15 % INTAKE / OUTPUT: Intake/Output     03/05 0701 - 03/06 0700 03/06 0701 - 03/07 0700   I.V. (mL/kg) 529.6 (6) 50.1 (0.6)   Blood     Other 120 30   IV Piggyback 398    Total Intake(mL/kg) 1047.6 (11.9) 80.1 (0.9)   Urine (mL/kg/hr) 1405 (0.7) 108 (0.3)   Total Output 1405 108   Net -357.4 -27.9           PHYSICAL EXAMINATION: General:  Looks okay with BiPAP on Neuro:  Awake, alert, cooperative HEENT:  PERRL, pail conjunctiva, dry membranes Cardiovascular:  RRR, no m/r/g Lungs:  Bilateral diminished air entry, few bibasilar rales. Has BiPAP on. No paradoxical respirations Abdomen:  Soft, nontender, bowel sounds diminished Musculoskeletal:  Moves all extremities, no edema Skin:  Intact   Imaging x48 hours  Dg Chest 2 View  10/19/2012  *RADIOLOGY REPORT*  Clinical Data: Chest pain and shortness of breath.  CHEST - 2 VIEW  Comparison: None.  Findings: There are extensive bilateral  airspace disease and small pleural effusions.  No pneumothorax is identified.  There is cardiomegaly.  IMPRESSION: Extensive bilateral airspace disease with pleural effusions could be due to multi focal pneumonia or pulmonary edema.   Original Report Authenticated By: Holley Dexter, M.D.    Dg Chest Port 1 View  10/21/2012  *RADIOLOGY REPORT*  Clinical Data: Evaluate respiratory failure  PORTABLE CHEST - 1 VIEW  Comparison: 10/19/2012  Findings: Grossly unchanged enlarged cardiac silhouette and mediastinal contours with atherosclerotic calcifications within a mildly tortuous and possibly  slightly ectatic thoracic aorta. Grossly unchanged extensive bilateral heterogeneous air space opacities.  Pulmonary vasculature is indistinct.  Query trace bilateral effusions.  No pneumothorax.  Grossly unchanged bones including suspected bilateral glenohumeral joint degenerative change.  IMPRESSION: Grossly unchanged extensive bilateral heterogeneous air space opacities worrisome for multifocal infection but underlying pulmonary edema is not excluded. A follow-up chest radiograph in 4 to 6 weeks after treatment is recommended to ensure resolution.   Original Report Authenticated By: Tacey Ruiz, MD       ASSESSMENT / PLAN:  PULMONARY  Recent Labs Lab 10/19/12 2234  TCO2 26    A: Acute hypoxemic respiratory failure. HCAP (recent admission) vs acute pulmonary edema. - 10/21/2012: Acute hypoxemic respiratory failure persists. Congestive heart failure etiology suspected. Work of breathing itself seems okay but needing 60% facemask oxygen on BiPAP  P:   Continue BiPAP but can take rest at daytime  Gaol SpO2> 88% Supplemental oxygen Antibiotics / diuresis as below Trend CXR Intubate if worsens    CARDIOVASCULAR  Recent Labs Lab 10/20/12 0320  PROBNP 38564.0*      Recent Labs Lab 10/19/12 2104 10/20/12 0320 10/20/12 0750 10/20/12 1451 10/20/12 2130  TROPONINI 6.24* 11.51* 10.93* 7.13* 9.95*    A: ACS vs demand ischemia.  Suspected ischemic cardiomyopathy / acute systolic CHF.  H/o CAD / MI, HTN.  Statin intolerant.  - 10/21/2012: Maintaining hemodynamics. Suspect MI as the primary etiology resulting in decompensation P:  Goal MAP>60 ASA / heparin contraindicated in setting of acute GI hemorrhage Unable to give statins - intolerant No indications for emergent cardiac cath, but will need this admission Metoprolol IV Hold Clonidine Aggressive Lasix therapy for pulmonary edema Rest per cardiology    RENAL  Recent Labs Lab 10/19/12 2101 10/19/12 2234  10/20/12 0750 10/21/12 0533  NA 130* 131* 131* 134*  K 3.7 3.7 3.7 3.8  CL 90* 95* 92* 91*  CO2 22  --  24 23  GLUCOSE 215* 207* 175* 155*  BUN 60* 62* 64* 74*  CREATININE 3.89* 4.00* 4.00* 4.56*  CALCIUM 9.8  --  9.1 9.3  PHOS  --   --   --  3.7    A:  CKD, pre HD. - 10/21/2012: Creatinine worsening with aggressive Lasix therapy P:   Continue aggressive Lasix therapy Timing of dialysis per renal service  GASTROINTESTINAL A:  Suspected upper GI hemorrhage. Presented with melena. - 10/21/2012: no evidence of bleeding since admission P:   Conservative management according to [facility] at this point in time   HEMATOLOGIC  Recent Labs Lab 10/19/12 2101 10/19/12 2234 10/20/12 0750 10/21/12 0533  HGB 7.2* 6.5* 8.2* 9.5*  HCT 21.7* 19.0* 24.5* 29.1*  WBC 19.0*  --  18.1* 20.9*  PLT 372  --  286 319    A:  Acute blood loss anemia. Due to GI bleed prior to admission. Status post 2 units transfusion at admission - 10/21/2012: No further evidence of bleeding.  P:  Goal Hb > 8 in setting of acute ischemia  Trend CBC SCDs  INFECTIOUS  Recent Labs Lab 10/19/12 2105 10/20/12 0320 10/21/12 0533  LATICACIDVEN 3.0*  --   --   PROCALCITON  --  2.14 3.53    A:  Suspected HCAP. - 10/21/2012: Pro-calcitonin trend suggesting localized infection at the most P:   DC vancomycin and Zosyn Continue Levaquin for now but will stop based on pro-calcitonin and clinical trend    ENDOCRINE  A:  DM2.  Hyperglycemia. P:   SSI  NEUROLOGIC A:  No active issues. P:   Monitor  Global 10/21/2012::  No family at bedside x48 hours .    CRITICAL CARE:  The patient is critically ill with multiple organ systems failure and requires high complexity decision making for assessment and support, frequent evaluation and titration of therapies, application of advanced monitoring technologies and extensive interpretation of multiple databases. Critical Care Time devoted to patient care  services described in this note is 35 minutes.     Dr. Kalman Shan, M.D., [facility].C.P Pulmonary and Critical Care Medicine Staff Physician [facility] [place] [facility] Pager: [phone], If no answer or between  15:00h - 7:00h: call [phone]  10/21/2012 11:12 AM

## 2012-10-21 NOTE — Progress Notes (Signed)
RT placed patient back on bipap per sat 88% on nonrebreather. Patient is tolerating bipap well at this time. RT will continue to monitor.

## 2012-10-21 NOTE — Progress Notes (Signed)
Patient wanted a break from bipap, so RT placed patient on nonrebreather. Patient is tolerating well with a 96% sat. HR 99, and RR 19.

## 2012-10-21 NOTE — Progress Notes (Signed)
RN reports only 153 cc urine output since 7AM despite 160 mg IV furosemide X 2 today.  He's on Bipap--02 sat 94 % on 60% FIO2.  CXR with B airspace disease.  Will begin dialysis (1st treatment via L FA AVF) today.

## 2012-10-21 NOTE — Progress Notes (Signed)
S:breathing better.  Went into afib last night, now in sinus O:BP 83/47  Pulse 83  Temp(Src) 98.3 F (36.8 C) (Axillary)  Resp 28  Ht 5\' 6"  (1.676 m)  Wt 88.4 kg (194 lb 14.2 oz)  BMI 31.47 kg/m2  SpO2 93%  Intake/Output Summary (Last 24 hours) at 10/21/12 0800 Last data filed at 10/21/12 0700  Gross per 24 hour  Intake 1027.57 ml  Output   1405 ml  Net -377.43 ml   Weight change: 6.753 kg (14 lb 14.2 oz) ZOX:WRUEA and alert CVS:RRR Resp:Bibasilar crackles Abd:+ BS NTND Ext:no edema  Lt AVF NEURO:CNI ox3 M&SI   . antiseptic oral rinse  15 mL Mouth Rinse QID  . chlorhexidine  15 mL Mouth/Throat BID  . furosemide  160 mg Intravenous Q6H  . insulin aspart  0-9 Units Subcutaneous Q4H  . [START ON 10/22/2012] levofloxacin (LEVAQUIN) IV  500 mg Intravenous Q48H  . metoprolol  5 mg Intravenous Q6H  . pantoprazole (PROTONIX) IV  40 mg Intravenous Q12H  . piperacillin-tazobactam (ZOSYN)  IV  2.25 g Intravenous Q6H  . vancomycin  1,000 mg Intravenous Q48H   Dg Chest 2 View  10/19/2012  *RADIOLOGY REPORT*  Clinical Data: Chest pain and shortness of breath.  CHEST - 2 VIEW  Comparison: None.  Findings: There are extensive bilateral airspace disease and small pleural effusions.  No pneumothorax is identified.  There is cardiomegaly.  IMPRESSION: Extensive bilateral airspace disease with pleural effusions could be due to multi focal pneumonia or pulmonary edema.   Original Report Authenticated By: Holley Dexter, M.D.    Dg Chest Port 1 View  10/21/2012  *RADIOLOGY REPORT*  Clinical Data: Evaluate respiratory failure  PORTABLE CHEST - 1 VIEW  Comparison: 10/19/2012  Findings: Grossly unchanged enlarged cardiac silhouette and mediastinal contours with atherosclerotic calcifications within a mildly tortuous and possibly slightly ectatic thoracic aorta. Grossly unchanged extensive bilateral heterogeneous air space opacities.  Pulmonary vasculature is indistinct.  Query trace bilateral  effusions.  No pneumothorax.  Grossly unchanged bones including suspected bilateral glenohumeral joint degenerative change.  IMPRESSION: Grossly unchanged extensive bilateral heterogeneous air space opacities worrisome for multifocal infection but underlying pulmonary edema is not excluded. A follow-up chest radiograph in 4 to 6 weeks after treatment is recommended to ensure resolution.   Original Report Authenticated By: Tacey Ruiz, MD    BMET    Component Value Date/Time   NA 134* 10/21/2012 0533   K 3.8 10/21/2012 0533   CL 91* 10/21/2012 0533   CO2 23 10/21/2012 0533   GLUCOSE 155* 10/21/2012 0533   BUN 74* 10/21/2012 0533   CREATININE 4.56* 10/21/2012 0533   CALCIUM 9.3 10/21/2012 0533   GFRNONAA 12* 10/21/2012 0533   GFRAA 14* 10/21/2012 0533   CBC    Component Value Date/Time   WBC 20.9* 10/21/2012 0533   RBC 3.80* 10/21/2012 0533   HGB 9.5* 10/21/2012 0533   HCT 29.1* 10/21/2012 0533   PLT 319 10/21/2012 0533   MCV 76.6* 10/21/2012 0533   MCH 25.0* 10/21/2012 0533   MCHC 32.6 10/21/2012 0533   RDW 17.4* 10/21/2012 0533   LYMPHSABS 1.2 10/21/2012 0533   MONOABS 1.9* 10/21/2012 0533   EOSABS 0.0 10/21/2012 0533   BASOSABS 0.0 10/21/2012 0533     Assessment: 1. NSTEMI 2. Pulm edema 3. CKD4 4. Iron deficiency anemia   Plan: 1. Cont IV lasix, if unable to diurese appropriately today then will need HD 2.  Will need IV iron but  wait til out of pulm edema 3. Daily labs   MATTINGLY,MICHAEL T

## 2012-10-21 NOTE — Progress Notes (Signed)
UR COMPLETED  

## 2012-10-21 NOTE — Progress Notes (Signed)
Nursing 0945 Dr. Marchelle Gearing made aware that patient went back into afib at 0930.  Qtc is elevated on the monitor.  Order for EKG.  QTC elevated on EKG, Cardiologist made aware, continue with amiodarone gtt as ordered.

## 2012-10-21 NOTE — Progress Notes (Signed)
Nicholas [name] Daily Rounding Note 10/21/2012, 9:45 AM  SUBJECTIVE:       No nausea.  Not hungry No abdominal pain  OBJECTIVE:         Vital signs in last 24 hours:    Temp:  [97.9 F (36.6 C)-101.9 F (38.8 C)] 98.1 F (36.7 C) (03/06 0802) Pulse Rate:  [81-136] 91 (03/06 0830) Resp:  [17-33] 26 (03/06 0830) BP: (74-134)/(46-91) 103/62 mmHg (03/06 0830) SpO2:  [86 %-100 %] 94 % (03/06 0830) FiO2 (%):  [70 %-100 %] 100 % (03/05 1600) Weight:  [88.4 kg (194 lb 14.2 oz)] 88.4 kg (194 lb 14.2 oz) (03/06 0438)   General: Looks ill with Bipap in place   Heart: RRR Chest: clear in front Abdomen: soft, NT, obese, BS hypoactive  Extremities: no pedal edema Neuro/Psych:  Pleasant, not confused, not somnolent.   Intake/Output from previous day: 03/05 0701 - 03/06 0700 In: 1047.6 [I.V.:529.6; IV Piggyback:398] Out: 1405 [Urine:1405]  Intake/Output this shift: Total I/O In: 26.7 [I.V.:16.7; Other:10] Out: 50 [Urine:50]  Lab Results:  Recent Labs  10/19/12 2101 10/19/12 2234 10/20/12 0750 10/21/12 0533  WBC 19.0*  --  18.1* 20.9*  HGB 7.2* 6.5* 8.2* 9.5*  HCT 21.7* 19.0* 24.5* 29.1*  PLT 372  --  286 319   BMET  Recent Labs  10/19/12 2101 10/19/12 2234 10/20/12 0750 10/21/12 0533  NA 130* 131* 131* 134*  K 3.7 3.7 3.7 3.8  CL 90* 95* 92* 91*  CO2 22  --  24 23  GLUCOSE 215* 207* 175* 155*  BUN 60* 62* 64* 74*  CREATININE 3.89* 4.00* 4.00* 4.56*  CALCIUM 9.8  --  9.1 9.3   LFT  Recent Labs  10/19/12 2101 10/21/12 0533  PROT 7.4  --   ALBUMIN 3.2* 2.8*  AST 33  --   ALT 7  --   ALKPHOS 109  --   BILITOT 0.8  --    PT/INR  Recent Labs  10/19/12 2101  LABPROT 15.8*  INR 1.29   Hepatitis Panel No results found for this basename: HEPBSAG, HCVAB, HEPAIGM, HEPBIGM,  in the last 72 hours  Studies/Results: Dg Chest Port 1 View 10/21/2012  RADIOLOGY REPORT*  Clinical Data: Evaluate respiratory failure  PORTABLE CHEST - 1 VIEW  Comparison: 10/19/2012   Findings: Grossly unchanged enlarged cardiac silhouette and mediastinal contours with atherosclerotic calcifications within a mildly tortuous and possibly slightly ectatic thoracic aorta. Grossly unchanged extensive bilateral heterogeneous air space opacities.  Pulmonary vasculature is indistinct.  Query trace bilateral effusions.  No pneumothorax.  Grossly unchanged bones including suspected bilateral glenohumeral joint degenerative change.  IMPRESSION: Grossly unchanged extensive bilateral heterogeneous air space opacities worrisome for multifocal infection but underlying pulmonary edema is not excluded. A follow-up chest radiograph in 4 to 6 weeks after treatment is recommended to ensure resolution.   Original Report Authenticated By: Tacey Ruiz, MD     ASSESMENT: * Microcytic anemia. FOB +. S/p PRBCs x 2. Chronic anemia that was also transfused in Feb admission at [facility].  EGD in Feb 2014:  Fundal erythema other wise normal.  * Non STEMI. Hx coronary stent.  * CHF. Admitted with same at [facility] feb 2014 according to pt. Not able to tolerate [place] oxygen without resp distress requiring return to Bipap.  * Pneumonia, needs to be considered HAP as he was inpt in 09/2012. On Levaquin, Zosyn and Vanco.   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 Suspect this is contributing to chronic disease anemia   PLAN: *  At some point needs a colonoscopy but with the non STEMI and respiratory issues this may need to be deferred to outpt setting with Dr Bluford Kaufmann, his [name] *  Note renal suggestion for IV Iron, to be given post resolution of pulm edema.  *  Continue Protonix once daily.  Change to po when safe to do so. Also start POs when deemed safe to do so by CCM. Only needs once daily PPI. *  [name] will sign off.  Reconsult [place] if you feel he is stable enough to colonoscope.   LOS: 2 days   Jennye Moccasin  10/21/2012, 9:45 AM Pager: [phone]  I have personally taken an interval  history, reviewed the chart, and examined the patient.  I agree with the extender's note, impression and recommendations.  Barbette Hair. Arlyce Dice, MD, [facility] [phone]

## 2012-10-21 NOTE — Progress Notes (Signed)
Pharmacist Heart Failure Core Measure Documentation  Assessment: Nicholas [name] has an EF documented as 35-40%  on 3/5 by ECHO  Rationale: Heart failure patients with left ventricular systolic dysfunction (LVSD) and an EF < 40% should be prescribed an angiotensin converting enzyme inhibitor (ACEI) or angiotensin receptor blocker (ARB) at discharge unless a contraindication is documented in the medical record.  This patient is not currently on an ACEI or ARB for HF.  This note is being placed in the record in order to provide documentation that a contraindication to the use of these agents is present for this encounter.  ACE Inhibitor or Angiotensin Receptor Blocker is contraindicated (specify all that apply)  []   ACEI allergy AND ARB allergy []   Angioedema []   Moderate or severe aortic stenosis []   Hyperkalemia []   Hypotension []   Renal artery stenosis [x]   Worsening renal function, preexisting renal disease or dysfunction   Nicholas [name] 10/21/2012 12:49 PM

## 2012-10-21 NOTE — Progress Notes (Signed)
Subjective:  Patient lying flat comfortably.  Denies chest pain or pleurisy. Has been in and out of atrial fib several times since yesterday.  Tolerating well. On IV amiodarone drip.  Objective:  Vital Signs in the last 24 hours: Temp:  [97.9 F (36.6 C)-101.9 F (38.8 C)] 98.1 F (36.7 C) (03/06 0802) Pulse Rate:  [81-136] 91 (03/06 0830) Resp:  [17-33] 26 (03/06 0830) BP: (74-134)/(46-91) 103/62 mmHg (03/06 0830) SpO2:  [86 %-100 %] 94 % (03/06 0830) FiO2 (%):  [70 %-100 %] 100 % (03/05 1600) Weight:  [194 lb 14.2 oz (88.4 kg)] 194 lb 14.2 oz (88.4 kg) (03/06 0438)  Intake/Output from previous day: 03/05 0701 - 03/06 0700 In: 1047.6 [I.V.:529.6; IV Piggyback:398] Out: 1405 [Urine:1405] Intake/Output from this shift: Total I/O In: 26.7 [I.V.:16.7; Other:10] Out: 50 [Urine:50]  . antiseptic oral rinse  15 mL Mouth Rinse QID  . chlorhexidine  15 mL Mouth/Throat BID  . furosemide  160 mg Intravenous Q6H  . insulin aspart  0-9 Units Subcutaneous Q4H  . [START ON 10/22/2012] levofloxacin (LEVAQUIN) IV  500 mg Intravenous Q48H  . metoprolol  5 mg Intravenous Q6H  . pantoprazole (PROTONIX) IV  40 mg Intravenous Q12H  . piperacillin-tazobactam (ZOSYN)  IV  2.25 g Intravenous Q6H  . vancomycin  1,000 mg Intravenous Q48H   . amiodarone (NEXTERONE PREMIX) 360 mg/200 mL dextrose 30 mg/hr (10/21/12 0800)  . nitroGLYCERIN Stopped (10/20/12 0527)    Physical Exam: The patient appears to be in no distress. Skin is pale.  Head and neck exam reveals that the pupils are equal and reactive.  The extraocular movements are full.  There is no scleral icterus.  Mouth and pharynx are benign.  No lymphadenopathy.  No carotid bruits.  The jugular venous pressure is normal.  Thyroid is not enlarged or tender.  Chest reveals fine rales bilaterally, worse on right.  Heart reveals irregular rate 110/min. No murmur gallop or rub.  The abdomen is soft and nontender.  Bowel sounds are normoactive.   There is no hepatosplenomegaly or mass.  There are no abdominal bruits.  Extremities reveal no phlebitis or edema.   Neurologic exam is normal strength and no lateralizing weakness.  No sensory deficits.  Integument reveals no rash  Lab Results:  Recent Labs  10/20/12 0750 10/21/12 0533  WBC 18.1* 20.9*  HGB 8.2* 9.5*  PLT 286 319    Recent Labs  10/20/12 0750 10/21/12 0533  NA 131* 134*  K 3.7 3.8  CL 92* 91*  CO2 24 23  GLUCOSE 175* 155*  BUN 64* 74*  CREATININE 4.00* 4.56*    Recent Labs  10/20/12 1451 10/20/12 2130  TROPONINI 7.13* 9.95*   Hepatic Function Panel  Recent Labs  10/19/12 2101 10/21/12 0533  PROT 7.4  --   ALBUMIN 3.2* 2.8*  AST 33  --   ALT 7  --   ALKPHOS 109  --   BILITOT 0.8  --    No results found for this basename: CHOL,  in the last 72 hours No results found for this basename: PROTIME,  in the last 72 hours  Imaging: Dg Chest 2 View  10/19/2012  *RADIOLOGY REPORT*  Clinical Data: Chest pain and shortness of breath.  CHEST - 2 VIEW  Comparison: None.  Findings: There are extensive bilateral airspace disease and small pleural effusions.  No pneumothorax is identified.  There is cardiomegaly.  IMPRESSION: Extensive bilateral airspace disease with pleural effusions could be due  to multi focal pneumonia or pulmonary edema.   Original Report Authenticated By: Holley Dexter, M.D.    Dg Chest Port 1 View  10/21/2012  *RADIOLOGY REPORT*  Clinical Data: Evaluate respiratory failure  PORTABLE CHEST - 1 VIEW  Comparison: 10/19/2012  Findings: Grossly unchanged enlarged cardiac silhouette and mediastinal contours with atherosclerotic calcifications within a mildly tortuous and possibly slightly ectatic thoracic aorta. Grossly unchanged extensive bilateral heterogeneous air space opacities.  Pulmonary vasculature is indistinct.  Query trace bilateral effusions.  No pneumothorax.  Grossly unchanged bones including suspected bilateral glenohumeral  joint degenerative change.  IMPRESSION: Grossly unchanged extensive bilateral heterogeneous air space opacities worrisome for multifocal infection but underlying pulmonary edema is not excluded. A follow-up chest radiograph in 4 to 6 weeks after treatment is recommended to ensure resolution.   Original Report Authenticated By: Tacey Ruiz, MD     Cardiac Studies: EKG shows atrial fib with rapid VR. No ischemic changes.  NSTCs.  2D echo: Study Conclusions  - Left ventricle: The cavity size was normal. There was mild concentric hypertrophy. Systolic function was moderately reduced. The estimated ejection fraction was in the range of 35% to 40%. There is moderate hypokinesis of the entireinferior myocardium. Left ventricular diastolic function parameters were normal. - Aortic valve: Moderate diffuse thickening and calcification, consistent with sclerosis. - Mitral valve: Moderate to severe regurgitation directed eccentrically and posteriorly. - Left atrium: The atrium was mildly dilated. - Right ventricle: The cavity size was mildly dilated. Systolic function was moderately reduced. - Right atrium: The atrium was mildly dilated. - Atrial septum: No defect or patent foramen ovale was identified. - Pulmonary arteries: PA peak pressure: 46mm Hg (S). Impressions:  - The right ventricular systolic pressure was increased consistent with mild pulmonary hypertension.   Assessment/Plan:  1. NSTEMI secondary to demand ischemia secondary to severe anemia and PNA.  No further chest pain. 2. Acute respiratory failure secondary to PNA although chest xray ? For CHF as well. EF 35-40% 3. Bilateral PNA  4. Severe anemia secondary to GI bleed  5. H/O CAD with remote MI and PCI 1992 with recent nuclear stress test normal per patient  6. Carotid artery stenosis - scheduled for CEA next week in [place]  7. Chronic kidney disease nearing HD  8. HTN  9. DM  10.Paroxysmal atrial  fibrillation.  Plan: Continue IV amiodarone and lopressor. No anticoagulation because of GI bleed/anemia Continue attempts at diuresis but may need dialysis. Weight up 14 lb?? Since yesterday.    LOS: 2 days    Nicholas [name] 10/21/2012, 10:02 AM

## 2012-10-22 ENCOUNTER — Inpatient Hospital Stay (HOSPITAL_COMMUNITY): Payer: Medicare Other

## 2012-10-22 DIAGNOSIS — I255 Ischemic cardiomyopathy: Secondary | ICD-10-CM

## 2012-10-22 DIAGNOSIS — I480 Paroxysmal atrial fibrillation: Secondary | ICD-10-CM

## 2012-10-22 LAB — CBC WITH DIFFERENTIAL/PLATELET
Basophils Absolute: 0 10*3/uL (ref 0.0–0.1)
Eosinophils Relative: 0 % (ref 0–5)
HCT: 26.9 % — ABNORMAL LOW (ref 39.0–52.0)
Hemoglobin: 8.8 g/dL — ABNORMAL LOW (ref 13.0–17.0)
Lymphocytes Relative: 7 % — ABNORMAL LOW (ref 12–46)
Lymphs Abs: 1.1 10*3/uL (ref 0.7–4.0)
MCV: 76 fL — ABNORMAL LOW (ref 78.0–100.0)
Monocytes Absolute: 1.4 10*3/uL — ABNORMAL HIGH (ref 0.1–1.0)
Monocytes Relative: 9 % (ref 3–12)
Neutro Abs: 13.8 10*3/uL — ABNORMAL HIGH (ref 1.7–7.7)
RBC: 3.54 MIL/uL — ABNORMAL LOW (ref 4.22–5.81)
WBC: 16.3 10*3/uL — ABNORMAL HIGH (ref 4.0–10.5)

## 2012-10-22 LAB — LACTIC ACID, PLASMA: Lactic Acid, Venous: 1.8 mmol/L (ref 0.5–2.2)

## 2012-10-22 LAB — RENAL FUNCTION PANEL
Albumin: 2.6 g/dL — ABNORMAL LOW (ref 3.5–5.2)
CO2: 23 mEq/L (ref 19–32)
Chloride: 92 mEq/L — ABNORMAL LOW (ref 96–112)
Creatinine, Ser: 4.93 mg/dL — ABNORMAL HIGH (ref 0.50–1.35)
GFR calc Af Amer: 13 mL/min — ABNORMAL LOW (ref >90)
GFR calc non Af Amer: 11 mL/min — ABNORMAL LOW (ref >90)
Potassium: 4 mEq/L (ref 3.5–5.1)
Sodium: 136 mEq/L (ref 135–145)

## 2012-10-22 LAB — LIPASE, BLOOD: Lipase: 29 U/L (ref 11–59)

## 2012-10-22 LAB — GLUCOSE, CAPILLARY
Glucose-Capillary: 165 mg/dL — ABNORMAL HIGH (ref 70–99)
Glucose-Capillary: 175 mg/dL — ABNORMAL HIGH (ref 70–99)

## 2012-10-22 LAB — PROCALCITONIN: Procalcitonin: 4.56 ng/mL

## 2012-10-22 MED ORDER — DARBEPOETIN ALFA-POLYSORBATE 60 MCG/0.3ML IJ SOLN
60.0000 ug | INTRAMUSCULAR | Status: DC
  Filled 2012-10-22: qty 0.3

## 2012-10-22 MED ORDER — SODIUM CHLORIDE 0.9 % IV SOLN
1020.0000 mg | Freq: Once | INTRAVENOUS | Status: AC
  Administered 2012-10-22: 1020 mg via INTRAVENOUS
  Filled 2012-10-22: qty 34

## 2012-10-22 MED ORDER — RENA-VITE PO TABS
1.0000 | ORAL_TABLET | Freq: Every day | ORAL | Status: DC
  Administered 2012-10-22 – 2012-10-26 (×5): 1 via ORAL
  Filled 2012-10-22 (×6): qty 1

## 2012-10-22 MED ORDER — DARBEPOETIN ALFA-POLYSORBATE 60 MCG/0.3ML IJ SOLN
INTRAMUSCULAR | Status: AC
  Administered 2012-10-22: 60 ug via INTRAVENOUS
  Filled 2012-10-22: qty 0.3

## 2012-10-22 NOTE — Progress Notes (Signed)
Patient wanted to come off bipap to rest. RT placed patient on nonrebreather sats are 97%, HR 111, RR 18, BP 92/65. RT will continue to monitor.

## 2012-10-22 NOTE — Progress Notes (Signed)
Patient Name: Nicholas [name] Date of Encounter: 10/22/2012   Active Problems:   ACS (acute coronary syndrome)   HCAP (healthcare-associated pneumonia)   Acute respiratory failure with hypoxia   Acute blood loss anemia   CKD (chronic kidney disease)   Acute upper GI hemorrhage   DM (diabetes mellitus)   Hyperglycemia   PAF (paroxysmal atrial fibrillation)   Ischemic cardiomyopathy   SUBJECTIVE  No chest pain or sob.  Back in afib since yesterday morning @ ~ 9:30.  Asymptomatic and reasonably rate controlled in low 100's to 1teens.  BP's stable.  Remains on IV amio.  CURRENT MEDS . antiseptic oral rinse  15 mL Mouth Rinse QID  . chlorhexidine  15 mL Mouth/Throat BID  . furosemide  160 mg Intravenous Q6H  . insulin aspart  0-9 Units Subcutaneous Q4H  . levofloxacin (LEVAQUIN) IV  500 mg Intravenous Q48H  . metoprolol  2.5 mg Intravenous Q6H  . pantoprazole (PROTONIX) IV  40 mg Intravenous Q24H    OBJECTIVE  Filed Vitals:   10/22/12 0400 10/22/12 0500 10/22/12 0600 10/22/12 0700  BP: 114/72 98/55 93/56  98/47  Pulse: 109 107 105 106  Temp: 98.4 F (36.9 C)     TempSrc: Axillary     Resp: 23 22 26 21   Height:      Weight:      SpO2: 99% 99% 95% 98%    Intake/Output Summary (Last 24 hours) at 10/22/12 0729 Last data filed at 10/22/12 1610  Gross per 24 hour  Intake  855.4 ml  Output   4236 ml  Net -3380.6 ml   Filed Weights   10/21/12 0438 10/21/12 1825 10/21/12 2217  Weight: 194 lb 14.2 oz (88.4 kg) 192 lb 7.4 oz (87.3 kg) 183 lb 6.8 oz (83.2 kg)   PHYSICAL EXAM  General: Pleasant, NAD. Neuro: Alert and oriented X 3. Moves all extremities spontaneously. Psych: Normal affect. HEENT:  Normal  Neck: Supple without bruits.  JVP difficult to assess. Lungs:  Resp regular and unlabored, CTA. Heart: ir, ir, no s3, s4, or murmurs. Abdomen: Soft, diffusely tender, non-distended, BS + x 4.  Extremities: No clubbing, cyanosis or edema. DP/PT/Radials 2+ and equal  bilaterally.  Accessory Clinical Findings  CBC  Recent Labs  10/21/12 0533 10/22/12 0525  WBC 20.9* 16.3*  NEUTROABS 17.8* 13.8*  HGB 9.5* 8.8*  HCT 29.1* 26.9*  MCV 76.6* 76.0*  PLT 319 313   Basic Metabolic Panel  Recent Labs  10/21/12 0533 10/22/12 0525  NA 134* 136  K 3.8 4.0  CL 91* 92*  CO2 23 23  GLUCOSE 155* 189*  BUN 74* 74*  CREATININE 4.56* 4.93*  CALCIUM 9.3 8.6  PHOS 3.7 4.8*   Liver Function Tests  Recent Labs  10/19/12 2101 10/21/12 0533 10/22/12 0525  AST 33  --   --   ALT 7  --   --   ALKPHOS 109  --   --   BILITOT 0.8  --   --   PROT 7.4  --   --   ALBUMIN 3.2* 2.8* 2.6*   Cardiac Enzymes  Recent Labs  10/20/12 0750 10/20/12 1451 10/20/12 2130  TROPONINI 10.93* 7.13* 9.95*   TELE  Afib, low 100's to 1-teens for the most part with a few rises to 120's  Radiology/Studies  Dg Chest Port 1 View  10/21/2012  *RADIOLOGY REPORT*  Clinical Data: Evaluate respiratory failure  PORTABLE CHEST - 1 VIEW  Comparison: 10/19/2012  IMPRESSION: Grossly unchanged extensive bilateral heterogeneous air space opacities worrisome for multifocal infection but underlying pulmonary edema is not excluded. A follow-up chest radiograph in 4 to 6 weeks after treatment is recommended to ensure resolution.   Original Report Authenticated By: Tacey Ruiz, MD    ASSESSMENT AND PLAN  1.  Acute respiratory failure: likely multifactorial with anemia, type II nstemi, pna, and volume overload in the setting of progressive renal failure with dialysis initiated yesterday.  Pt feeling somewhat better this AM.  Abx per ccm.  Volume mgmt per renal.  2.  PAF:  On IV amio.  Asymptomatic with lenient rate control.  Cont bb as bp tolerates.  Not currently an anticoagulation candidate in the setting of acute GIB.  3.  NSTEMI:  Presumed to be type II in setting of GIB/anemia/pna.  Cont low dose bb.  Not a candidate for asa currently in setting of gib.  Add statin once PO meds  resumed.  No chest pain.  Not candidate for invasive evaluation at this time.  Apparently had recent low risk myoview in [place].  4.  ICM:  EF 35-40%.  Volume mgmt per renal with dialysis initiated yesterday.  Cont low dose bb as tolerated.  Not a candidate for acei/arb, spiro, hydralazine/nitrate with hypotn and worsening renal fxn.   5.  CKD IV:  Per renal.  HD yesterday with 9 lb reduction.  6.  GIB/Anemia:  S/p PRBC's this admission.  H/H sl down today.  7.  Carotid Stenosis:  Pending CEA in [place].  Signed, Nicolasa Ducking NP   Attending Note:   The patient was seen and examined.  Agree with assessment and plan as noted above.  Changes made to the above note as needed.  Pt was examined up in dialysis.  He is breathing well.   His Troponin is elevated - CK total is normal.    Continue with current treatment.  Will continue conservative treatment from the cardiac standpoint.    Vesta Mixer, Montez Hageman., MD, [facility] 10/22/2012, 12:47 PM

## 2012-10-22 NOTE — Progress Notes (Signed)
PULMONARY  / CRITICAL CARE MEDICINE  Name: Nicholas [name] MRN: [id number] DOB: [birth year]    ADMISSION DATE:  10/19/2012 CONSULTATION DATE:  10/20/2012  REFERRING MD :  EDP  CHIEF COMPLAINT:  Dyspnea  Brief:  72 yo all prior care at [facility] hospital and no records at [place] . With CKD in preparation for HD with burning chest pain and cough productive of yellow sputum x 2 days.  There was no hemoptysis.  Denies fever.  Reports black stool today.  No abdominal pain, nausea or vomiting. Recent admission in [place] with reportedly negative cardiac workup.  In ED:  Bilateral airspace disease, hypoxia requiring NRB, bilateral airspace disease, Troponin 6, Hb 7>>>6  >>> transfused 2U of PRBCs.   LINES / TUBES: BiPAP 10/20/2012 >>  CULTURES: 3/4  Blood >>> 10/19/2012 MRSA PCR is negative  Results for orders placed during the hospital encounter of 10/19/12  CULTURE, BLOOD (ROUTINE X 2)     Status: None   Collection Time    10/19/12  9:00 PM      Result Value Range Status   Specimen Description BLOOD RIGHT HAND   Final   Special Requests BOTTLES DRAWN AEROBIC ONLY 10CC   Final   Culture  Setup Time 10/20/2012 03:29   Final   Culture     Final   Value:        BLOOD CULTURE RECEIVED NO GROWTH TO DATE CULTURE WILL BE HELD FOR 5 DAYS BEFORE ISSUING A FINAL NEGATIVE REPORT   Report Status PENDING   Incomplete  CULTURE, BLOOD (ROUTINE X 2)     Status: None   Collection Time    10/19/12  9:25 PM      Result Value Range Status   Specimen Description BLOOD RIGHT ARM   Final   Special Requests BOTTLES DRAWN AEROBIC AND ANAEROBIC   Final   Culture  Setup Time 10/20/2012 03:29   Final   Culture     Final   Value:        BLOOD CULTURE RECEIVED NO GROWTH TO DATE CULTURE WILL BE HELD FOR 5 DAYS BEFORE ISSUING A FINAL NEGATIVE REPORT   Report Status PENDING   Incomplete  MRSA PCR SCREENING     Status: None   Collection Time    10/20/12  2:03 AM      Result Value Range Status    MRSA by PCR NEGATIVE  NEGATIVE Final   Comment:            The GeneXpert MRSA Assay (FDA     approved for NASAL specimens     only), is one component of a     comprehensive MRSA colonization     surveillance program. It is not     intended to diagnose MRSA     infection nor to guide or     monitor treatment for     MRSA infections.  URINE CULTURE     Status: None   Collection Time    10/20/12  9:04 AM      Result Value Range Status   Specimen Description URINE, CATHETERIZED   Final   Special Requests vancomycin, levaquin Normal   Final   Culture  Setup Time 10/20/2012 09:46   Final   Colony Count NO GROWTH   Final   Culture NO GROWTH   Final   Report Status 10/21/2012 FINAL   Final     ANTIBIOTICS: Zosyn 3/4 >>> 10/21/2012 Vancomycin 3/4 >>>  10/21/2012 Levaquin 3/4 >>>    Recent Labs Lab 10/19/12 2105 10/20/12 0320 10/21/12 0533 10/22/12 0525 10/22/12 0843  LATICACIDVEN 3.0*  --   --   --  1.8  PROCALCITON  --  2.14 3.53 4.56  --      SIGNIFICANT EVENTS / STUDIES:  3/5  Admitted with elevated troponin in setting of pneumonia and acute anemia.  Hemoglobin of 7.2 >>> transfused 2U of PRBCs.  10/20/2012 repeat rounds: Hypoxemia with 100% oxygen on nonrebreather continues pulse ox is in the low 90s high 80s. Work of breathing is okay  10/20/2012: Echocardiogram The estimated ejection fraction was in the range of 35% to 40%. There is moderate hypokinesis of the entireinferior myocardium. Left ventricular diastolic function parameters were normal.  10/21/2012: Acute hypoxemic respiratory failure persist despite Lasix. Hemodialysis started. Continues on BiPAP    SUBJECTIVE/OVERNIGHT/INTERVAL HX 10/22/12: Despite dialysis yesterday continues to be in hypoxemic respiratory failure needing BiPAP and high flow oxygen. Complaining of abdominal pain new onset nonspecific moderate in intensity today  VITAL SIGNS: Temp:  [98 F (36.7 C)-98.8 F (37.1 C)] 98 F (36.7  C) (03/07 1015) Pulse Rate:  [33-131] 107 (03/07 1100) Resp:  [17-28] 20 (03/07 1015) BP: (47-134)/(28-91) 114/62 mmHg (03/07 1100) SpO2:  [82 %-99 %] 99 % (03/07 1123) FiO2 (%):  [15 %-60 %] 60 % (03/06 2239) Weight:  [83.2 kg (183 lb 6.8 oz)-88.4 kg (194 lb 14.2 oz)] 88.4 kg (194 lb 14.2 oz) (03/07 1015) HEMODYNAMICS:   VENTILATOR SETTINGS: Vent Mode:  [-]  FiO2 (%):  [15 %-60 %] 60 % Set Rate:  [8 bmp] 8 bmp PEEP:  [6 cmH20] 6 cmH20 INTAKE / OUTPUT: Intake/Output     03/06 0701 - 03/07 0700 03/07 0701 - 03/08 0700   I.V. (mL/kg) 417.5 (5)    Other 220    IV Piggyback 364    Total Intake(mL/kg) 1001.5 (12)    Urine (mL/kg/hr) 236 (0.1) 100 (0.2)   Other 4000 (2)    Total Output 4236 100   Net -3234.5 -100           PHYSICAL EXAMINATION: General:  Looks okay with BiPAP on Neuro:  Awake, alert, cooperative HEENT:  PERRL, pail conjunctiva, dry membranes Cardiovascular:  RRR, no m/r/g Lungs:  Bilateral diminished air entry, few bibasilar rales. Has BiPAP on. No paradoxical respirations Abdomen:  Soft,  bowel sounds diminished, nonspecific tenderness present. No rebound or guarding Musculoskeletal:  Moves all extremities, no edema Skin:  Intact   Imaging x48 hours  Dg Chest Port 1 View  10/22/2012  *RADIOLOGY REPORT*  Clinical Data: Acute respiratory failure.  PORTABLE CHEST - 1 VIEW  Comparison: 10/21/2012 and 10/19/2012.  Findings: 0931 hours.  Again demonstrated are diffuse bilateral air space opacities.  There has been interval worsening of the right upper lobe component.  There are bilateral pleural effusions. Cardiomegaly and aortic atherosclerosis are stable.  There is no pneumothorax.  Bilateral glenohumeral arthropathy again noted.  IMPRESSION: Persistent bilateral air space opacities with interval worsening of the right upper lobe component again concerning for multilobar infection.  Bilateral pleural effusions.   Original Report Authenticated By: Carey Bullocks, M.D.     Dg Chest Port 1 View  10/21/2012  *RADIOLOGY REPORT*  Clinical Data: Evaluate respiratory failure  PORTABLE CHEST - 1 VIEW  Comparison: 10/19/2012  Findings: Grossly unchanged enlarged cardiac silhouette and mediastinal contours with atherosclerotic calcifications within a mildly tortuous and possibly slightly ectatic thoracic aorta. Grossly unchanged extensive bilateral  heterogeneous air space opacities.  Pulmonary vasculature is indistinct.  Query trace bilateral effusions.  No pneumothorax.  Grossly unchanged bones including suspected bilateral glenohumeral joint degenerative change.  IMPRESSION: Grossly unchanged extensive bilateral heterogeneous air space opacities worrisome for multifocal infection but underlying pulmonary edema is not excluded. A follow-up chest radiograph in 4 to 6 weeks after treatment is recommended to ensure resolution.   Original Report Authenticated By: Tacey Ruiz, MD       ASSESSMENT / PLAN:  PULMONARY  Recent Labs Lab 10/19/12 2234  TCO2 26    A: Acute hypoxemic respiratory failure. HCAP (recent admission) vs acute pulmonary edema. Also on amiodarone since admission 10/19/2012 for a fibrillation  - 10/22/2012: Hypoxemic respiratory failure persist despite BiPAP and dialysis x1. Chest x-ray is unchanged  P:   Continue BiPAP but can take rest at daytime  If dialysis on 10/22/2012 does not help with hypoxemia we'll have to consider acute amiodarone lung toxicity in the differentialI Gaol SpO2> 88% Intubate if worsens    CARDIOVASCULAR  Recent Labs Lab 10/20/12 0320  PROBNP 38564.0*      Recent Labs Lab 10/19/12 2104 10/20/12 0320 10/20/12 0750 10/20/12 1451 10/20/12 2130  TROPONINI 6.24* 11.51* 10.93* 7.13* 9.95*    A: ACS vs demand ischemia.  Suspected ischemic cardiomyopathy / acute systolic CHF.  H/o CAD / MI, HTN.  Statin intolerant.  - 10/22/2012: Maintaining hemodynamics but still on amiodarone for atrial fibrillation. Suspect MI  as the primary etiology resulting in decompensation P:  Goal MAP>60 Continue amiodarone per cardiology ASA / heparin contraindicated in setting of acute GI hemorrhage Unable to give statins - intolerant No indications for emergent cardiac cath, but will need this admission Metoprolol IV Hold Clonidine Rest per cardiology Fluid removal via dialysis   RENAL  Recent Labs Lab 10/19/12 2101 10/19/12 2234 10/20/12 0750 10/21/12 0533 10/22/12 0525  NA 130* 131* 131* 134* 136  K 3.7 3.7 3.7 3.8 4.0  CL 90* 95* 92* 91* 92*  CO2 22  --  24 23 23   GLUCOSE 215* 207* 175* 155* 189*  BUN 60* 62* 64* 74* 74*  CREATININE 3.89* 4.00* 4.00* 4.56* 4.93*  CALCIUM 9.8  --  9.1 9.3 8.6  PHOS  --   --   --  3.7 4.8*    A:  CKD, pre HD. - 10/21/2012: Creatinine worsening with aggressive Lasix therapy. Hemodialysis x1 - 10/22/2012: No improvement in hypoxemic respiratory failure P:   Repeat dialysis again 10/22/2012 per nephrology   GASTROINTESTINAL  Recent Labs Lab 10/19/12 2101 10/21/12 0533 10/22/12 0525  AST 33  --   --   ALT 7  --   --   ALKPHOS 109  --   --   BILITOT 0.8  --   --   PROT 7.4  --   --   ALBUMIN 3.2* 2.8* 2.6*  INR 1.29  --   --     A:  Suspected upper GI hemorrhage. Presented with melena. - 10/22/2012: no evidence of bleeding since admission but having some nonspecific abdominal pain/soreness. Normal lactic acid,  Amylase.  lipase and CK P:   Conservative management according to [facility] at this point in time   HEMATOLOGIC  Recent Labs Lab 10/20/12 0750 10/21/12 0533 10/22/12 0525  HGB 8.2* 9.5* 8.8*  HCT 24.5* 29.1* 26.9*  WBC 18.1* 20.9* 16.3*  PLT 286 319 313    A:  Acute blood loss anemia. Due to GI bleed prior to admission.  Status post 2 units transfusion at admission - 10/22/2012: No further evidence of bleeding.  P:  Goal Hb > 8 in setting of acute ischemia  Trend CBC SCDs  INFECTIOUS  Recent Labs Lab 10/19/12 2105  10/20/12 0320 10/21/12 0533 10/22/12 0525 10/22/12 0843  LATICACIDVEN 3.0*  --   --   --  1.8  PROCALCITON  --  2.14 3.53 4.56  --     A:  Suspected HCAP. - 10/22/2012: Pro-calcitonin trend suggesting localized infection at the most but this could be false positive and renal failure P:   Continue Levaquin for now but will stop based on pro-calcitonin and clinical trend    ENDOCRINE  A:  DM2.  Hyperglycemia. P:   SSI  NEUROLOGIC A:  No active issues. P:   Monitor  Global 10/21/2012::  No family at bedside x72 hours at time of critical care M.D. rounds  CRITICAL CARE:  The patient is critically ill with multiple organ systems failure and requires high complexity decision making for assessment and support, frequent evaluation and titration of therapies, application of advanced monitoring technologies and extensive interpretation of multiple databases. Critical Care Time devoted to patient care services described in this note is 35 minutes.     Dr. Kalman Shan, M.D., [facility].C.P Pulmonary and Critical Care Medicine Staff Physician [facility] [place] [facility] Pager: [phone], If no answer or between  15:00h - 7:00h: call [phone]  10/22/2012 11:35 AM

## 2012-10-22 NOTE — Progress Notes (Addendum)
S:required HD yest as UO remained poor, tolerated it well O:BP 98/47  Pulse 106  Temp(Src) 98.2 F (36.8 C) (Axillary)  Resp 21  Ht 5\' 6"  (1.676 m)  Wt 83.2 kg (183 lb 6.8 oz)  BMI 29.62 kg/m2  SpO2 98%  Intake/Output Summary (Last 24 hours) at 10/22/12 0806 Last data filed at 10/22/12 0700  Gross per 24 hour  Intake  974.8 ml  Output   4186 ml  Net -3211.2 ml   Weight change: -1.1 kg (-2 lb 6.8 oz) OZH:YQMVH and alert CVS: tachy,reg Resp:Bibasilar crackles Abd:+ BS mild diffuse tenderness >in RUQ Ext:no edema  Lt AVF + bruit NEURO:CNI ox3 M&SI   . antiseptic oral rinse  15 mL Mouth Rinse QID  . chlorhexidine  15 mL Mouth/Throat BID  . furosemide  160 mg Intravenous Q6H  . insulin aspart  0-9 Units Subcutaneous Q4H  . levofloxacin (LEVAQUIN) IV  500 mg Intravenous Q48H  . metoprolol  2.5 mg Intravenous Q6H  . pantoprazole (PROTONIX) IV  40 mg Intravenous Q24H   Dg Chest Port 1 View  10/21/2012  *RADIOLOGY REPORT*  Clinical Data: Evaluate respiratory failure  PORTABLE CHEST - 1 VIEW  Comparison: 10/19/2012  Findings: Grossly unchanged enlarged cardiac silhouette and mediastinal contours with atherosclerotic calcifications within a mildly tortuous and possibly slightly ectatic thoracic aorta. Grossly unchanged extensive bilateral heterogeneous air space opacities.  Pulmonary vasculature is indistinct.  Query trace bilateral effusions.  No pneumothorax.  Grossly unchanged bones including suspected bilateral glenohumeral joint degenerative change.  IMPRESSION: Grossly unchanged extensive bilateral heterogeneous air space opacities worrisome for multifocal infection but underlying pulmonary edema is not excluded. A follow-up chest radiograph in 4 to 6 weeks after treatment is recommended to ensure resolution.   Original Report Authenticated By: Tacey Ruiz, MD    BMET    Component Value Date/Time   NA 136 10/22/2012 0525   K 4.0 10/22/2012 0525   CL 92* 10/22/2012 0525   CO2 23  10/22/2012 0525   GLUCOSE 189* 10/22/2012 0525   BUN 74* 10/22/2012 0525   CREATININE 4.93* 10/22/2012 0525   CALCIUM 8.6 10/22/2012 0525   GFRNONAA 11* 10/22/2012 0525   GFRAA 13* 10/22/2012 0525   CBC    Component Value Date/Time   WBC 16.3* 10/22/2012 0525   RBC 3.54* 10/22/2012 0525   HGB 8.8* 10/22/2012 0525   HCT 26.9* 10/22/2012 0525   PLT 313 10/22/2012 0525   MCV 76.0* 10/22/2012 0525   MCH 24.9* 10/22/2012 0525   MCHC 32.7 10/22/2012 0525   RDW 17.5* 10/22/2012 0525   LYMPHSABS 1.1 10/22/2012 0525   MONOABS 1.4* 10/22/2012 0525   EOSABS 0.0 10/22/2012 0525   BASOSABS 0.0 10/22/2012 0525     Assessment: 1. NSTEMI 2. Pulm edema 3. CKD4, ? 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 Iron deficiency anemia   Plan: 1. Plan HD again today 2. IV Iron 3. Cont IV lasix for another day and if no UO then DC 4. Start Aranesp 5. Start rena vite   MATTINGLY,MICHAEL T

## 2012-10-23 DIAGNOSIS — I5021 Acute systolic (congestive) heart failure: Secondary | ICD-10-CM

## 2012-10-23 DIAGNOSIS — I200 Unstable angina: Secondary | ICD-10-CM

## 2012-10-23 LAB — RENAL FUNCTION PANEL
CO2: 23 mEq/L (ref 19–32)
Calcium: 9 mg/dL (ref 8.4–10.5)
Chloride: 94 mEq/L — ABNORMAL LOW (ref 96–112)
GFR calc Af Amer: 13 mL/min — ABNORMAL LOW (ref >90)
Glucose, Bld: 165 mg/dL — ABNORMAL HIGH (ref 70–99)
Potassium: 3.8 mEq/L (ref 3.5–5.1)
Sodium: 136 mEq/L (ref 135–145)

## 2012-10-23 LAB — CBC WITH DIFFERENTIAL/PLATELET
Eosinophils Relative: 0 % (ref 0–5)
Lymphocytes Relative: 10 % — ABNORMAL LOW (ref 12–46)
Lymphs Abs: 1.3 10*3/uL (ref 0.7–4.0)
MCV: 75.8 fL — ABNORMAL LOW (ref 78.0–100.0)
Neutro Abs: 10.4 10*3/uL — ABNORMAL HIGH (ref 1.7–7.7)
Platelets: 309 10*3/uL (ref 150–400)
RBC: 3.59 MIL/uL — ABNORMAL LOW (ref 4.22–5.81)
WBC: 13.2 10*3/uL — ABNORMAL HIGH (ref 4.0–10.5)

## 2012-10-23 LAB — GLUCOSE, CAPILLARY
Glucose-Capillary: 173 mg/dL — ABNORMAL HIGH (ref 70–99)
Glucose-Capillary: 181 mg/dL — ABNORMAL HIGH (ref 70–99)

## 2012-10-23 MED ORDER — METOPROLOL TARTRATE 1 MG/ML IV SOLN
INTRAVENOUS | Status: AC
  Filled 2012-10-23: qty 5

## 2012-10-23 MED ORDER — AMIODARONE HCL 200 MG PO TABS
200.0000 mg | ORAL_TABLET | Freq: Two times a day (BID) | ORAL | Status: DC
  Administered 2012-10-23: 200 mg via ORAL
  Filled 2012-10-23 (×2): qty 1

## 2012-10-23 MED ORDER — ENOXAPARIN SODIUM 30 MG/0.3ML ~~LOC~~ SOLN
30.0000 mg | Freq: Two times a day (BID) | SUBCUTANEOUS | Status: DC
  Administered 2012-10-23 – 2012-10-25 (×5): 30 mg via SUBCUTANEOUS
  Filled 2012-10-23 (×8): qty 0.3

## 2012-10-23 MED ORDER — PANTOPRAZOLE SODIUM 40 MG PO TBEC
40.0000 mg | DELAYED_RELEASE_TABLET | Freq: Every day | ORAL | Status: DC
  Administered 2012-10-24 – 2012-10-27 (×4): 40 mg via ORAL
  Filled 2012-10-23 (×3): qty 1

## 2012-10-23 MED ORDER — METOPROLOL TARTRATE 1 MG/ML IV SOLN
5.0000 mg | Freq: Once | INTRAVENOUS | Status: AC
  Administered 2012-10-23: 5 mg via INTRAVENOUS

## 2012-10-23 MED ORDER — AMIODARONE HCL IN DEXTROSE 360-4.14 MG/200ML-% IV SOLN
30.0000 mg/h | INTRAVENOUS | Status: DC
  Administered 2012-10-23 – 2012-10-24 (×2): 30 mg/h via INTRAVENOUS
  Filled 2012-10-23 (×3): qty 200

## 2012-10-23 MED ORDER — METOPROLOL SUCCINATE ER 50 MG PO TB24
50.0000 mg | ORAL_TABLET | Freq: Two times a day (BID) | ORAL | Status: DC
  Administered 2012-10-23 – 2012-10-27 (×8): 50 mg via ORAL
  Filled 2012-10-23 (×10): qty 1

## 2012-10-23 NOTE — Progress Notes (Signed)
S:breathing better  No CP  Eating well O:BP 126/82  Pulse 112  Temp(Src) 98 F (36.7 C) (Axillary)  Resp 31  Ht 5\' 6"  (1.676 m)  Wt 82.1 kg (181 lb)  BMI 29.23 kg/m2  SpO2 96%  Intake/Output Summary (Last 24 hours) at 10/23/12 0935 Last data filed at 10/23/12 0800  Gross per 24 hour  Intake  865.4 ml  Output   2641 ml  Net -1775.6 ml   Weight change: 1.1 kg (2 lb 6.8 oz) ZOX:WRUEA and alert CVS: tachy, irreg,irreg Resp:Bibasilar crackles Abd:+ BS NTND Ext:no edema  Lt AVF + bruit NEURO:CNI ox3 M&SI   . antiseptic oral rinse  15 mL Mouth Rinse QID  . chlorhexidine  15 mL Mouth/Throat BID  . darbepoetin (ARANESP) injection - DIALYSIS  60 mcg Intravenous Q Fri-HD  . furosemide  160 mg Intravenous Q6H  . insulin aspart  0-9 Units Subcutaneous Q4H  . metoprolol  2.5 mg Intravenous Q6H  . multivitamin  1 tablet Oral QHS  . pantoprazole (PROTONIX) IV  40 mg Intravenous Q24H   Dg Chest Port 1 View  10/22/2012  *RADIOLOGY REPORT*  Clinical Data: Acute respiratory failure.  PORTABLE CHEST - 1 VIEW  Comparison: 10/21/2012 and 10/19/2012.  Findings: 0931 hours.  Again demonstrated are diffuse bilateral air space opacities.  There has been interval worsening of the right upper lobe component.  There are bilateral pleural effusions. Cardiomegaly and aortic atherosclerosis are stable.  There is no pneumothorax.  Bilateral glenohumeral arthropathy again noted.  IMPRESSION: Persistent bilateral air space opacities with interval worsening of the right upper lobe component again concerning for multilobar infection.  Bilateral pleural effusions.   Original Report Authenticated By: Carey Bullocks, M.D.    BMET    Component Value Date/Time   NA 136 10/23/2012 0440   K 3.8 10/23/2012 0440   CL 94* 10/23/2012 0440   CO2 23 10/23/2012 0440   GLUCOSE 165* 10/23/2012 0440   BUN 71* 10/23/2012 0440   CREATININE 4.85* 10/23/2012 0440   CALCIUM 9.0 10/23/2012 0440   GFRNONAA 11* 10/23/2012 0440   GFRAA 13*  10/23/2012 0440   CBC    Component Value Date/Time   WBC 13.2* 10/23/2012 0440   RBC 3.59* 10/23/2012 0440   HGB 8.9* 10/23/2012 0440   HCT 27.2* 10/23/2012 0440   PLT 309 10/23/2012 0440   MCV 75.8* 10/23/2012 0440   MCH 24.8* 10/23/2012 0440   MCHC 32.7 10/23/2012 0440   RDW 18.1* 10/23/2012 0440   LYMPHSABS 1.3 10/23/2012 0440   MONOABS 1.5* 10/23/2012 0440   EOSABS 0.0 10/23/2012 0440   BASOSABS 0.0 10/23/2012 0440     Assessment: 1. NSTEMI 2. Pulm edema 3. CKD4, now at [facility] 4. Iron deficiency anemia 5. PAF on AMIO   Plan: 1. Plan HD again today 2. DC lasix 3. DC foley 4 recheck CXR in AM   MATTINGLY,MICHAEL T

## 2012-10-23 NOTE — Progress Notes (Signed)
PULMONARY  / CRITICAL CARE MEDICINE  Name: Nicholas [name] MRN: [id number] DOB: 1940/08/25    ADMISSION DATE:  10/19/2012 CONSULTATION DATE:  10/20/2012  REFERRING MD :  EDP  CHIEF COMPLAINT:  Dyspnea  Brief:  72 yo all prior care at [facility] hospital and no records at [place] . With CKD in preparation for HD with burning chest pain and cough productive of yellow sputum x 2 days.  There was no hemoptysis.  Denies fever.  Reports black stool today.  No abdominal pain, nausea or vomiting. Recent admission in [place] with reportedly negative cardiac workup.  In ED:  Bilateral airspace disease, hypoxia requiring NRB, bilateral airspace disease, Troponin 6, Hb 7>>>6  >>> transfused 2U of PRBCs.   LINES / TUBES: BiPAP 10/20/2012 >>  CULTURES: 3/4  Blood >>> 10/19/2012 MRSA PCR is negative  Results for orders placed during the hospital encounter of 10/19/12  CULTURE, BLOOD (ROUTINE X 2)     Status: None   Collection Time    10/19/12  9:00 PM      Result Value Range Status   Specimen Description BLOOD RIGHT HAND   Final   Special Requests BOTTLES DRAWN AEROBIC ONLY 10CC   Final   Culture  Setup Time 10/20/2012 03:29   Final   Culture     Final   Value:        BLOOD CULTURE RECEIVED NO GROWTH TO DATE CULTURE WILL BE HELD FOR 5 DAYS BEFORE ISSUING A FINAL NEGATIVE REPORT   Report Status PENDING   Incomplete  CULTURE, BLOOD (ROUTINE X 2)     Status: None   Collection Time    10/19/12  9:25 PM      Result Value Range Status   Specimen Description BLOOD RIGHT ARM   Final   Special Requests BOTTLES DRAWN AEROBIC AND ANAEROBIC   Final   Culture  Setup Time 10/20/2012 03:29   Final   Culture     Final   Value:        BLOOD CULTURE RECEIVED NO GROWTH TO DATE CULTURE WILL BE HELD FOR 5 DAYS BEFORE ISSUING A FINAL NEGATIVE REPORT   Report Status PENDING   Incomplete  MRSA PCR SCREENING     Status: None   Collection Time    10/20/12  2:03 AM      Result Value Range Status    MRSA by PCR NEGATIVE  NEGATIVE Final   Comment:            The GeneXpert MRSA Assay (FDA     approved for NASAL specimens     only), is one component of a     comprehensive MRSA colonization     surveillance program. It is not     intended to diagnose MRSA     infection nor to guide or     monitor treatment for     MRSA infections.  URINE CULTURE     Status: None   Collection Time    10/20/12  9:04 AM      Result Value Range Status   Specimen Description URINE, CATHETERIZED   Final   Special Requests vancomycin, levaquin Normal   Final   Culture  Setup Time 10/20/2012 09:46   Final   Colony Count NO GROWTH   Final   Culture NO GROWTH   Final   Report Status 10/21/2012 FINAL   Final     ANTIBIOTICS: Zosyn 3/4 >>> 10/21/2012 Vancomycin 3/4 >>>  10/21/2012 Levaquin 3/4 >>>    Recent Labs Lab 10/19/12 2105 10/20/12 0320 10/21/12 0533 10/22/12 0525 10/22/12 0843  LATICACIDVEN 3.0*  --   --   --  1.8  PROCALCITON  --  2.14 3.53 4.56  --      SIGNIFICANT EVENTS / STUDIES:  3/5  Admitted with elevated troponin in setting of pneumonia and acute anemia.  Hemoglobin of 7.2 >>> transfused 2U of PRBCs.  10/20/2012 repeat rounds: Hypoxemia with 100% oxygen on nonrebreather continues pulse ox is in the low 90s high 80s. Work of breathing is okay  10/20/2012: Echocardiogram The estimated ejection fraction was in the range of 35% to 40%. There is moderate hypokinesis of the entireinferior myocardium. Left ventricular diastolic function parameters were normal.  10/21/2012: Acute hypoxemic respiratory failure persist despite Lasix. Hemodialysis started. Continues on BiPAP  10/22/2012: Some abdominal pain but labs are negative  SUBJECTIVE/OVERNIGHT/INTERVAL HX 10/23/12: Despite dialysis x 2 past 2 days continues to be in hypoxemic respiratory failure needing BiPAP and high flow oxygen.  VITAL SIGNS: Temp:  [97.8 F (36.6 C)-98.2 F (36.8 C)] 98.2 F (36.8 C) (03/08  0355) Pulse Rate:  [76-128] 105 (03/08 0600) Resp:  [16-31] 19 (03/08 0600) BP: (91-135)/(34-102) 110/70 mmHg (03/08 0600) SpO2:  [92 %-100 %] 99 % (03/08 0600) Weight:  [82.1 kg (181 lb)-88.4 kg (194 lb 14.2 oz)] 82.1 kg (181 lb) (03/08 0500) HEMODYNAMICS:   VENTILATOR SETTINGS:   INTAKE / OUTPUT: Intake/Output     03/07 0701 - 03/08 0700 03/08 0701 - 03/09 0700   P.O. 170    I.V. (mL/kg) 464.1 (5.7)    Other 60    IV Piggyback 248    Total Intake(mL/kg) 942.1 (11.5)    Urine (mL/kg/hr) 275 (0.1)    Other 2366 (1.2)    Total Output 2641     Net -1698.9             PHYSICAL EXAMINATION: General:  Looks okay with BiPAP on Neuro:  Awake, alert, cooperative HEENT:  PERRL, pail conjunctiva, dry membranes Cardiovascular:  RRR, no m/r/g Lungs:  Bilateral diminished air entry, few bibasilar rales. Has BiPAP on. No paradoxical respirations Abdomen:  Soft,  bowel sounds diminished, nonspecific tenderness present. No rebound or guarding Musculoskeletal:  Moves all extremities, no edema Skin:  Intact   Imaging x48 hours  Dg Chest Port 1 View  10/22/2012  *RADIOLOGY REPORT*  Clinical Data: Acute respiratory failure.  PORTABLE CHEST - 1 VIEW  Comparison: 10/21/2012 and 10/19/2012.  Findings: 0931 hours.  Again demonstrated are diffuse bilateral air space opacities.  There has been interval worsening of the right upper lobe component.  There are bilateral pleural effusions. Cardiomegaly and aortic atherosclerosis are stable.  There is no pneumothorax.  Bilateral glenohumeral arthropathy again noted.  IMPRESSION: Persistent bilateral air space opacities with interval worsening of the right upper lobe component again concerning for multilobar infection.  Bilateral pleural effusions.   Original Report Authenticated By: Carey Bullocks, M.D.       ASSESSMENT / PLAN:  PULMONARY  Recent Labs Lab 10/19/12 2234  TCO2 26    A: Acute hypoxemic respiratory failure. HCAP (recent admission)  vs acute pulmonary edema. Also on amiodarone since admission 10/19/2012 for a fibrillation  - 10/23/2012: Hypoxemic respiratory failure persist despite BiPAP and dialysis x2. Chest x-ray is unchanged  P:   Check ESR and i elevated consider amiodarone toxicity Continue BiPAP but can take rest at daytime  Gaol SpO2> 88% Intubate if worsens  CARDIOVASCULAR  Recent Labs Lab 10/20/12 0320  PROBNP 38564.0*      Recent Labs Lab 10/19/12 2104 10/20/12 0320 10/20/12 0750 10/20/12 1451 10/20/12 2130  TROPONINI 6.24* 11.51* 10.93* 7.13* 9.95*    A: ACS vs demand ischemia.  Suspected ischemic cardiomyopathy / acute systolic CHF.  H/o CAD / MI, HTN.  Statin intolerant.  - 10/23/2012: Maintaining hemodynamics but still on amiodarone for atrial fibrillation. Suspect MI as the primary etiology resulting in decompensation P:  Goal MAP>60 Continue amiodarone per cardiology (check ESR and if elevated consider amiodarone toxicity] ASA / heparin contraindicated in setting of acute GI hemorrhage Unable to give statins - intolerant No indications for emergent cardiac cath, but will need this admission Metoprolol IV Hold Clonidine Rest per cardiology Fluid removal via dialysis   RENAL  Recent Labs Lab 10/19/12 2101 10/19/12 2234 10/20/12 0750 10/21/12 0533 10/22/12 0525 10/23/12 0440  NA 130* 131* 131* 134* 136 136  K 3.7 3.7 3.7 3.8 4.0 3.8  CL 90* 95* 92* 91* 92* 94*  CO2 22  --  24 23 23 23   GLUCOSE 215* 207* 175* 155* 189* 165*  BUN 60* 62* 64* 74* 74* 71*  CREATININE 3.89* 4.00* 4.00* 4.56* 4.93* 4.85*  CALCIUM 9.8  --  9.1 9.3 8.6 9.0  PHOS  --   --   --  3.7 4.8* 4.2    A:  CKD, pre HD. - 10/21/2012: Creatinine worsening with aggressive Lasix therapy. Hemodialysis x2 - 10/23/2012: No improvement in hypoxemic respiratory failure P:   ? Repeat dialysis again 10/23/2012 per nephrology   GASTROINTESTINAL  Recent Labs Lab 10/19/12 2101 10/21/12 0533  10/22/12 0525 10/23/12 0440  AST 33  --   --   --   ALT 7  --   --   --   ALKPHOS 109  --   --   --   BILITOT 0.8  --   --   --   PROT 7.4  --   --   --   ALBUMIN 3.2* 2.8* 2.6* 2.6*  INR 1.29  --   --   --     A:  Suspected upper GI hemorrhage. Presented with melena. - 10/22/2012: no evidence of bleeding since admission but having some nonspecific abdominal pain/soreness. Normal lactic acid,  Amylase.  lipase and CK P:   Conservative management according to [facility] at this point in time   HEMATOLOGIC  Recent Labs Lab 10/21/12 0533 10/22/12 0525 10/23/12 0440  HGB 9.5* 8.8* 8.9*  HCT 29.1* 26.9* 27.2*  WBC 20.9* 16.3* 13.2*  PLT 319 313 309    A:  Acute blood loss anemia. Due to GI bleed prior to admission. Status post 2 units transfusion at admission - 10/23/2012: No further evidence of bleeding.  P:  Goal Hb > 8 in setting of acute ischemia  Trend CBC SCDs  INFECTIOUS  Recent Labs Lab 10/19/12 2105 10/20/12 0320 10/21/12 0533 10/22/12 0525 10/22/12 0843  LATICACIDVEN 3.0*  --   --   --  1.8  PROCALCITON  --  2.14 3.53 4.56  --     A:  Suspected HCAP. - 10/22/2012: Pro-calcitonin trend suggesting localized infection at the most but this could be false positive and renal failure P:   Stop Levaquin   ENDOCRINE  A:  DM2.  Hyperglycemia. P:   SSI  NEUROLOGIC A:  No active issues. P:   Monitor  Global 10/21/2012::  No family at bedside x 4 days  at time of critical care M.D. rounds  CRITICAL CARE:  The patient is critically ill with multiple organ systems failure and requires high complexity decision making for assessment and support, frequent evaluation and titration of therapies, application of advanced monitoring technologies and extensive interpretation of multiple databases. Critical Care Time devoted to patient care services described in this note is 35 minutes.     Dr. Kalman Shan, M.D., [facility].C.P Pulmonary and Critical Care  Medicine Staff Physician [facility] Pager: [phone], If no answer or between  15:00h - 7:00h: call [phone]  10/23/2012 8:04 AM

## 2012-10-23 NOTE — Progress Notes (Addendum)
Patient Name: Nicholas [name] Date of Encounter: 10/23/2012   Active Problems:   ACS (acute coronary syndrome)   HCAP (healthcare-associated pneumonia)   Acute respiratory failure with hypoxia   Acute blood loss anemia   CKD (chronic kidney disease)   Acute upper GI hemorrhage   DM (diabetes mellitus)   Hyperglycemia   PAF (paroxysmal atrial fibrillation)   Ischemic cardiomyopathy   SUBJECTIVE  Feeling better. Has received HD x2. No cp. Dyspnea improving. No GI bleeding. HR remains 100-115 on IV amio. BP stable. Has not been anti-coagulated due to ?GIB  Hgb on admit wass 7.2. (microcytic/fe-deficient with FOB+). Had EGD 1/14 with just some fundal erythema. GI deferred colonoscopy due to NSTEMI.   CURRENT MEDS . antiseptic oral rinse  15 mL Mouth Rinse QID  . chlorhexidine  15 mL Mouth/Throat BID  . darbepoetin (ARANESP) injection - DIALYSIS  60 mcg Intravenous Q Fri-HD  . insulin aspart  0-9 Units Subcutaneous Q4H  . metoprolol  2.5 mg Intravenous Q6H  . multivitamin  1 tablet Oral QHS  . pantoprazole (PROTONIX) IV  40 mg Intravenous Q24H    OBJECTIVE  Filed Vitals:   10/23/12 0800 10/23/12 0900 10/23/12 1000 10/23/12 1100  BP: 106/72 126/82 107/75 121/105  Pulse: 114 112 114 96  Temp: 98 F (36.7 C)     TempSrc:      Resp: 18 31 18 17   Height:      Weight:      SpO2: 100% 96% 99% 97%    Intake/Output Summary (Last 24 hours) at 10/23/12 1117 Last data filed at 10/23/12 1000  Gross per 24 hour  Intake  918.8 ml  Output   2541 ml  Net -1622.2 ml   Filed Weights   10/22/12 1015 10/22/12 1341 10/23/12 0500  Weight: 88.4 kg (194 lb 14.2 oz) 83.5 kg (184 lb 1.4 oz) 82.1 kg (181 lb)   PHYSICAL EXAM  General: Pleasant, NAD. On facemask Neuro: Alert and oriented X 3. Moves all extremities spontaneously. Psych: Normal affect. HEENT:  Normal  Neck: Supple without bruits.  JVP elevatd Lungs:  Resp regular and unlabored, CTA. Heart: ir, ir, tachy. no s3, s4, or  murmurs. Abdomen: Soft, nontender, non-distended, BS + x 4.  Extremities: No clubbing, cyanosis or edema. DP/PT/Radials 2+ and equal bilaterally. 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  Accessory Clinical Findings  CBC  Recent Labs  10/22/12 0525 10/23/12 0440  WBC 16.3* 13.2*  NEUTROABS 13.8* 10.4*  HGB 8.8* 8.9*  HCT 26.9* 27.2*  MCV 76.0* 75.8*  PLT 313 309   Basic Metabolic Panel  Recent Labs  10/22/12 0525 10/23/12 0440  NA 136 136  K 4.0 3.8  CL 92* 94*  CO2 23 23  GLUCOSE 189* 165*  BUN 74* 71*  CREATININE 4.93* 4.85*  CALCIUM 8.6 9.0  PHOS 4.8* 4.2   Liver Function Tests  Recent Labs  10/22/12 0525 10/23/12 0440  ALBUMIN 2.6* 2.6*   Cardiac Enzymes  Recent Labs  10/20/12 1451 10/20/12 2130 10/22/12 0900  CKTOTAL  --   --  21  TROPONINI 7.13* 9.95*  --    TELE  Afib, low 100's-115   Radiology/Studies  Dg Chest Port 1 View  10/21/2012  *RADIOLOGY REPORT*  Clinical Data: Evaluate respiratory failure  PORTABLE CHEST - 1 VIEW  Comparison: 10/19/2012    IMPRESSION: Grossly unchanged extensive bilateral heterogeneous air space opacities worrisome for multifocal infection but underlying pulmonary edema is not excluded. A follow-up  chest radiograph in 4 to 6 weeks after treatment is recommended to ensure resolution.   Original Report Authenticated By: Tacey Ruiz, MD    ASSESSMENT AND PLAN  1.  Acute respiratory failure: likely HF+/- PNA.   2.  PAF:  See below  3.  NSTEMI:  Presumed to be type II in setting of GIB/anemia/pna.  Cont low dose bb.  Not a candidate for asa currently in setting of gib.  Add statin once PO meds resumed.  No chest pain.  Not candidate for invasive evaluation at this time.  Apparently had recent low risk myoview in [place].  4.  Acute systolic HF ICM:  EF 35-40%.  Volume mgmt per renal with dialysis initiated yesterday.  Cont low dose bb as tolerated.  Not a candidate for acei/arb, spiro, hydralazine/nitrate with hypotn and  worsening renal fxn.   5.  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  HD yesterday with 9 lb reduction.  6.  GIB/Fe-defAnemia:  S/p PRBC's this admission.  This seems chronic. Will ask pharmacy about feraheme  7.  Carotid Stenosis:  Pending CEA in [place].   DISCUSSION:    Difficult situation. He has NSTEMI, AF and systolic HF in setting on progressive, now end-stage renal failure. Still has volume overload managed by HD.   From NSTEMI standpoint currently stable without CP/angina. Managing medically for now until renal and GI issues straightened out. Will need to consider diagnostic cath at some point especially prior to upcoming CEA surgery. Continue ASA and b-blocker as tolerated.  With regard to AF, rate still fast. Will switch amio to po. (may be able to stop if we can rate control with b-blocker) Increase b-blocker as tolerated. No coumadin for now but may consider in future. Start DVT dose lovenox.  Will ask pharmacy about Feraheme. -- Spoke with Nicholas [name], PharmD who will order with dialysis. (thanks!)  Nicholas [name] 11:33 AM  Addendum: Spoke with Dr. Briant Cedar. He has already ordered Feraheme.  Nicholas Bensimhon,MD 11:43 AM

## 2012-10-23 NOTE — Progress Notes (Signed)
Called by nurse for elevated rates post-dialysis, afib 120-140, not symptomatic with this. SBP 120's. D/w Dr. Jens Som. Will stop po amiodarone and resume IV amiodarone drip. Will also give 5mg  IV Lopressor now and watch rates. Dayna Dunn PA-C

## 2012-10-24 ENCOUNTER — Inpatient Hospital Stay (HOSPITAL_COMMUNITY): Payer: Medicare Other

## 2012-10-24 LAB — CBC WITH DIFFERENTIAL/PLATELET
Eosinophils Absolute: 0.2 10*3/uL (ref 0.0–0.7)
Eosinophils Relative: 1 % (ref 0–5)
HCT: 32 % — ABNORMAL LOW (ref 39.0–52.0)
Hemoglobin: 10.4 g/dL — ABNORMAL LOW (ref 13.0–17.0)
Lymphocytes Relative: 10 % — ABNORMAL LOW (ref 12–46)
Lymphs Abs: 1.3 10*3/uL (ref 0.7–4.0)
MCH: 25.2 pg — ABNORMAL LOW (ref 26.0–34.0)
MCV: 77.5 fL — ABNORMAL LOW (ref 78.0–100.0)
Monocytes Relative: 13 % — ABNORMAL HIGH (ref 3–12)
RBC: 4.13 MIL/uL — ABNORMAL LOW (ref 4.22–5.81)
WBC: 13.1 10*3/uL — ABNORMAL HIGH (ref 4.0–10.5)

## 2012-10-24 LAB — GLUCOSE, CAPILLARY
Glucose-Capillary: 135 mg/dL — ABNORMAL HIGH (ref 70–99)
Glucose-Capillary: 203 mg/dL — ABNORMAL HIGH (ref 70–99)
Glucose-Capillary: 204 mg/dL — ABNORMAL HIGH (ref 70–99)

## 2012-10-24 MED ORDER — ACETAMINOPHEN 325 MG PO TABS
650.0000 mg | ORAL_TABLET | Freq: Four times a day (QID) | ORAL | Status: DC | PRN
  Administered 2012-10-24 – 2012-10-27 (×3): 650 mg via ORAL
  Filled 2012-10-24 (×3): qty 2

## 2012-10-24 MED ORDER — AMIODARONE HCL 200 MG PO TABS
200.0000 mg | ORAL_TABLET | Freq: Two times a day (BID) | ORAL | Status: DC
  Administered 2012-10-24 – 2012-10-27 (×7): 200 mg via ORAL
  Filled 2012-10-24 (×8): qty 1

## 2012-10-24 MED ORDER — ACETAMINOPHEN 500 MG PO TABS
1000.0000 mg | ORAL_TABLET | Freq: Once | ORAL | Status: AC
  Administered 2012-10-24: 1000 mg via ORAL
  Filled 2012-10-24: qty 2

## 2012-10-24 MED ORDER — ASPIRIN 81 MG PO CHEW
81.0000 mg | CHEWABLE_TABLET | Freq: Every day | ORAL | Status: DC
  Administered 2012-10-24 – 2012-10-27 (×4): 81 mg via ORAL
  Filled 2012-10-24 (×3): qty 1

## 2012-10-24 MED ORDER — INSULIN ASPART 100 UNIT/ML ~~LOC~~ SOLN
0.0000 [IU] | Freq: Three times a day (TID) | SUBCUTANEOUS | Status: DC
  Administered 2012-10-24: 3 [IU] via SUBCUTANEOUS
  Administered 2012-10-25: 5 [IU] via SUBCUTANEOUS
  Administered 2012-10-25: 2 [IU] via SUBCUTANEOUS
  Administered 2012-10-26 (×3): 3 [IU] via SUBCUTANEOUS

## 2012-10-24 MED ORDER — ENOXAPARIN SODIUM 30 MG/0.3ML ~~LOC~~ SOLN
30.0000 mg | Freq: Once | SUBCUTANEOUS | Status: AC
  Administered 2012-10-24: 30 mg via SUBCUTANEOUS
  Filled 2012-10-24: qty 0.3

## 2012-10-24 NOTE — Progress Notes (Signed)
Subjective:  Feels well this am.  Sitting up in chair.  No chest pain.  Coughing up some mucus. Remaining in NSR on IV amiodarone.  Objective:  Vital Signs in the last 24 hours: Temp:  [97.4 F (36.3 C)-98.7 F (37.1 C)] 98 F (36.7 C) (03/09 0754) Pulse Rate:  [82-139] 82 (03/09 1100) Resp:  [15-27] 17 (03/09 1100) BP: (93-156)/(48-82) 93/69 mmHg (03/09 1100) SpO2:  [91 %-100 %] 94 % (03/09 1100) FiO2 (%):  [50 %] 50 % (03/09 0100) Weight:  [172 lb 2.9 oz (78.1 kg)-179 lb 0.2 oz (81.2 kg)] 177 lb 4 oz (80.4 kg) (03/09 0400)  Intake/Output from previous day: 03/08 0701 - 03/09 0700 In: 1203.3 [P.O.:780; I.V.:423.3] Out: 3093 [Urine:165] Intake/Output from this shift: Total I/O In: 290.1 [P.O.:240; I.V.:50.1] Out: -   . aspirin  81 mg Oral Daily  . darbepoetin (ARANESP) injection - DIALYSIS  60 mcg Intravenous Q Fri-HD  . enoxaparin (LOVENOX) injection  30 mg Subcutaneous Q12H  . insulin aspart  0-9 Units Subcutaneous Q4H  . metoprolol succinate  50 mg Oral BID  . multivitamin  1 tablet Oral QHS  . pantoprazole  40 mg Oral Daily   . amiodarone (NEXTERONE PREMIX) 360 mg/200 mL dextrose 30 mg/hr (10/24/12 1000)  . nitroGLYCERIN Stopped (10/20/12 0527)    Physical Exam: The patient appears to be in no distress.  Head and neck exam reveals that the pupils are equal and reactive.  The extraocular movements are full.  There is no scleral icterus.  Mouth and pharynx are benign.  No lymphadenopathy.  No carotid bruits.  The jugular venous pressure is normal.  Thyroid is not enlarged or tender.  Chest good aeration bilateral. Mild rhonchi.  Heart reveals no abnormal lift or heave.  First and second heart sounds are normal.  There is no murmur gallop rub or click. Rhythm is regular.  The abdomen is soft and nontender.  Bowel sounds are normoactive.  There is no hepatosplenomegaly or mass.  There are no abdominal bruits.  Extremities reveal no phlebitis or edema.  Pedal  pulses are good.  There is no cyanosis or clubbing. Large gouty tophus right elbow.  Neurologic exam is normal strength and no lateralizing weakness.  No sensory deficits.  Integument reveals no rash   Lab Results:  Recent Labs  10/23/12 0440 10/24/12 0435  WBC 13.2* 13.1*  HGB 8.9* 10.4*  PLT 309 340    Recent Labs  10/22/12 0525 10/23/12 0440  NA 136 136  K 4.0 3.8  CL 92* 94*  CO2 23 23  GLUCOSE 189* 165*  BUN 74* 71*  CREATININE 4.93* 4.85*   No results found for this basename: TROPONINI, CK, MB,  in the last 72 hours Hepatic Function Panel  Recent Labs  10/23/12 0440  ALBUMIN 2.6*   No results found for this basename: CHOL,  in the last 72 hours No results found for this basename: PROTIME,  in the last 72 hours  Imaging: Dg Chest Port 1 View  10/24/2012  *RADIOLOGY REPORT*  Clinical Data: Evaluate endotracheal tube position and pleural effusions.  On dialysis.  PORTABLE CHEST - 1 VIEW  Comparison: 10/22/2012  Findings: Bilateral glenohumeral joint osteoarthritis. Cardiomegaly with atherosclerosis in the transverse aorta. Improved to resolved bilateral pleural effusions. No pneumothorax. Improved interstitial edema, mild.  Patchy airspace disease is also improved, most confluent in the right upper and right lower lobes. Improved left base air space disease as well.  IMPRESSION: Improving  interstitial and airspace opacities.  Favor interstitial edema and multifocal infection.  Improved to resolved bilateral pleural effusions.   Original Report Authenticated By: Jeronimo Greaves, M.D.     Cardiac Studies: Telemetry NSR Assessment/Plan:  Paroxysmal atrial fibrillation, now back in NSR. Acute on chronic systolic CHF with  Volume controlled with dialysis.  Plan: will try switch again to oral amiodarone. BP low so will use amiodarone 200 mg BID  LOS: 5 days    Cassell Clement 10/24/2012, 12:10 PM

## 2012-10-24 NOTE — Progress Notes (Signed)
S:breathing better  No CP  Eating well O:BP 106/70  Pulse 83  Temp(Src) 98 F (36.7 C) (Oral)  Resp 20  Ht 5\' 6"  (1.676 m)  Wt 80.4 kg (177 lb 4 oz)  BMI 28.62 kg/m2  SpO2 96%  Intake/Output Summary (Last 24 hours) at 10/24/12 1191 Last data filed at 10/24/12 0700  Gross per 24 hour  Intake 936.62 ml  Output   3093 ml  Net -2156.38 ml   Weight change: -7.2 kg (-15 lb 14 oz) YNW:GNFAO and alert CVS: tachy, irreg,irreg Resp:Bibasilar crackles Abd:+ BS NTND Ext:no edema  Lt AVF + bruit NEURO:CNI ox3 M&SI   . antiseptic oral rinse  15 mL Mouth Rinse QID  . chlorhexidine  15 mL Mouth/Throat BID  . darbepoetin (ARANESP) injection - DIALYSIS  60 mcg Intravenous Q Fri-HD  . enoxaparin (LOVENOX) injection  30 mg Subcutaneous Q12H  . insulin aspart  0-9 Units Subcutaneous Q4H  . metoprolol succinate  50 mg Oral BID  . multivitamin  1 tablet Oral QHS  . pantoprazole  40 mg Oral Daily   Dg Chest Port 1 View  10/22/2012  *RADIOLOGY REPORT*  Clinical Data: Acute respiratory failure.  PORTABLE CHEST - 1 VIEW  Comparison: 10/21/2012 and 10/19/2012.  Findings: 0931 hours.  Again demonstrated are diffuse bilateral air space opacities.  There has been interval worsening of the right upper lobe component.  There are bilateral pleural effusions. Cardiomegaly and aortic atherosclerosis are stable.  There is no pneumothorax.  Bilateral glenohumeral arthropathy again noted.  IMPRESSION: Persistent bilateral air space opacities with interval worsening of the right upper lobe component again concerning for multilobar infection.  Bilateral pleural effusions.   Original Report Authenticated By: Carey Bullocks, M.D.    BMET    Component Value Date/Time   NA 136 10/23/2012 0440   K 3.8 10/23/2012 0440   CL 94* 10/23/2012 0440   CO2 23 10/23/2012 0440   GLUCOSE 165* 10/23/2012 0440   BUN 71* 10/23/2012 0440   CREATININE 4.85* 10/23/2012 0440   CALCIUM 9.0 10/23/2012 0440   GFRNONAA 11* 10/23/2012 0440   GFRAA 13*  10/23/2012 0440   CBC    Component Value Date/Time   WBC 13.1* 10/24/2012 0435   RBC 4.13* 10/24/2012 0435   HGB 10.4* 10/24/2012 0435   HCT 32.0* 10/24/2012 0435   PLT 340 10/24/2012 0435   MCV 77.5* 10/24/2012 0435   MCH 25.2* 10/24/2012 0435   MCHC 32.5 10/24/2012 0435   RDW 18.6* 10/24/2012 0435   LYMPHSABS 1.3 10/24/2012 0435   MONOABS 1.8* 10/24/2012 0435   EOSABS 0.2 10/24/2012 0435   BASOSABS 0.0 10/24/2012 0435     Assessment: 1. NSTEMI 2. Pulm edema, improved 3. CKD4, now at ESRD 4. Iron deficiency anemia, received feraheme 5. PAF on AMIO   Plan: 1. HD in AM 2.  Working on outpt spot at [facility] 3. Could transfer out of ICU   MATTINGLY,MICHAEL T

## 2012-10-24 NOTE — Progress Notes (Signed)
PULMONARY  / CRITICAL CARE MEDICINE  Name: Nicholas [name] MRN: [id number] DOB: 04-Apr-1941    ADMISSION DATE:  10/19/2012 CONSULTATION DATE:  10/20/2012  REFERRING MD :  EDP  CHIEF COMPLAINT:  Dyspnea  Brief:  72 yo all prior care at [facility] [place] [facility] and no records at [place] . With CKD in preparation for HD with burning chest pain and cough productive of yellow sputum x 2 days.  There was no hemoptysis.  Denies fever.  Reports black stool today.  No abdominal pain, nausea or vomiting. Recent admission in [place] with reportedly negative cardiac workup.  In ED:  Bilateral airspace disease, hypoxia requiring NRB, bilateral airspace disease, Troponin 6, Hb 7>>>6  >>> transfused 2U of PRBCs.   LINES / TUBES: BiPAP 10/20/2012 >>10/23/2012  CULTURES: 3/4  Blood >>> 10/19/2012 MRSA PCR is negative  Results for orders placed during the hospital encounter of 10/19/12  CULTURE, BLOOD (ROUTINE X 2)     Status: None   Collection Time    10/19/12  9:00 PM      Result Value Range Status   Specimen Description BLOOD RIGHT HAND   Final   Special Requests BOTTLES DRAWN AEROBIC ONLY 10CC   Final   Culture  Setup Time 10/20/2012 03:29   Final   Culture     Final   Value:        BLOOD CULTURE RECEIVED NO GROWTH TO DATE CULTURE WILL BE HELD FOR 5 DAYS BEFORE ISSUING A FINAL NEGATIVE REPORT   Report Status PENDING   Incomplete  CULTURE, BLOOD (ROUTINE X 2)     Status: None   Collection Time    10/19/12  9:25 PM      Result Value Range Status   Specimen Description BLOOD RIGHT ARM   Final   Special Requests BOTTLES DRAWN AEROBIC AND ANAEROBIC   Final   Culture  Setup Time 10/20/2012 03:29   Final   Culture     Final   Value:        BLOOD CULTURE RECEIVED NO GROWTH TO DATE CULTURE WILL BE HELD FOR 5 DAYS BEFORE ISSUING A FINAL NEGATIVE REPORT   Report Status PENDING   Incomplete  MRSA PCR SCREENING     Status: None   Collection Time    10/20/12  2:03 AM      Result Value  Range Status   MRSA by PCR NEGATIVE  NEGATIVE Final   Comment:            The GeneXpert MRSA Assay (FDA     approved for NASAL specimens     only), is one component of a     comprehensive MRSA colonization     surveillance program. It is not     intended to diagnose MRSA     infection nor to guide or     monitor treatment for     MRSA infections.  URINE CULTURE     Status: None   Collection Time    10/20/12  9:04 AM      Result Value Range Status   Specimen Description URINE, CATHETERIZED   Final   Special Requests vancomycin, levaquin Normal   Final   Culture  Setup Time 10/20/2012 09:46   Final   Colony Count NO GROWTH   Final   Culture NO GROWTH   Final   Report Status 10/21/2012 FINAL   Final     ANTIBIOTICS: Zosyn 3/4 >>> 10/21/2012 Vancomycin 3/4 >>>  10/21/2012 Levaquin 3/4 >>> 10/23/2012    Recent Labs Lab 10/19/12 2105 10/20/12 0320 10/21/12 0533 10/22/12 0525 10/22/12 0843  LATICACIDVEN 3.0*  --   --   --  1.8  PROCALCITON  --  2.14 3.53 4.56  --      SIGNIFICANT EVENTS / STUDIES:  3/5  Admitted with elevated troponin in setting of pneumonia and acute anemia.  Hemoglobin of 7.2 >>> transfused 2U of PRBCs.  10/20/2012 repeat rounds: Hypoxemia with 100% oxygen on nonrebreather continues pulse ox is in the low 90s high 80s. Work of breathing is okay  10/20/2012: Echocardiogram The estimated ejection fraction was in the range of 35% to 40%. There is moderate hypokinesis of the entireinferior myocardium. Left ventricular diastolic function parameters were normal.  10/21/2012: Acute hypoxemic respiratory failure persist despite Lasix. Hemodialysis started. Continues on BiPAP  10/22/2012: Some abdominal pain but labs are negative  10/23/12: Despite dialysis x 2 past 2 days continues to be in hypoxemic respiratory failure needing BiPAP and high flow oxygen.    SUBJECTIVE/OVERNIGHT/INTERVAL HX 10/24/2012: Third hemodialysis yesterday his oxygen requirements  improved to 2 L of nasal cannula. His work of breathing is much better. He does not need BiPAP anymore. He feels he is ready to get transferred out of the ICU  VITAL SIGNS: Temp:  [97.4 F (36.3 C)-98.7 F (37.1 C)] 98 F (36.7 C) (03/09 0754) Pulse Rate:  [82-139] 83 (03/09 0700) Resp:  [15-31] 20 (03/09 0700) BP: (99-156)/(48-105) 106/70 mmHg (03/09 0700) SpO2:  [91 %-100 %] 96 % (03/09 0700) FiO2 (%):  [50 %] 50 % (03/09 0100) Weight:  [78.1 kg (172 lb 2.9 oz)-81.2 kg (179 lb 0.2 oz)] 80.4 kg (177 lb 4 oz) (03/09 0400) HEMODYNAMICS:   VENTILATOR SETTINGS: Vent Mode:  [-] BIPAP FiO2 (%):  [50 %] 50 % Set Rate:  [10 bmp] 10 bmp INTAKE / OUTPUT: Intake/Output     03/08 0701 - 03/09 0700 03/09 0701 - 03/10 0700   P.O. 540    I.V. (mL/kg) 423.3 (5.3)    Other     IV Piggyback     Total Intake(mL/kg) 963.3 (12)    Urine (mL/kg/hr) 165 (0.1)    Other 2928 (1.5)    Total Output 3093     Net -2129.7             PHYSICAL EXAMINATION: General:  Looking much better and more alert and cheerful  Neuro:  Awake, alert, cooperative HEENT:  PERRL, pail conjunctiva, dry membranes Cardiovascular:  RRR, no m/r/g Lungs:  Clear breath sounds. No crackles no wheeze no increased work of breathing No paradoxical respirations Abdomen:  Soft,  bowel sounds diminished, nonspecific tenderness present. No rebound or guarding Musculoskeletal:  Moves all extremities, no edema Skin:  Intact   Imaging x48 hours  Dg Chest Port 1 View  10/22/2012  *RADIOLOGY REPORT*  Clinical Data: Acute respiratory failure.  PORTABLE CHEST - 1 VIEW  Comparison: 10/21/2012 and 10/19/2012.  Findings: 0931 hours.  Again demonstrated are diffuse bilateral air space opacities.  There has been interval worsening of the right upper lobe component.  There are bilateral pleural effusions. Cardiomegaly and aortic atherosclerosis are stable.  There is no pneumothorax.  Bilateral glenohumeral arthropathy again noted.  IMPRESSION:  Persistent bilateral air space opacities with interval worsening of the right upper lobe component again concerning for multilobar infection.  Bilateral pleural effusions.   Original Report Authenticated By: Carey Bullocks, M.D.       ASSESSMENT /  PLAN:  PULMONARY  Recent Labs Lab 10/19/12 2234  TCO2 26    A: Acute hypoxemic respiratory failure. HCAP (recent admission) vs acute pulmonary edema. Also on amiodarone since admission 10/19/2012 for a fibrillation  - 10/24/2012: Hypoxemic respiratory failure much improved after 3rd HD yesterday. Now down to [place] o2. Acute amio lung toxicity less likely given improvement (though ESR 95)  P:   DC bipap [place] o2 for [place] SpO2> 88% Pulmonary toilet.     CARDIOVASCULAR  Recent Labs Lab 10/20/12 0320  PROBNP 38564.0*      Recent Labs Lab 10/19/12 2104 10/20/12 0320 10/20/12 0750 10/20/12 1451 10/20/12 2130  TROPONINI 6.24* 11.51* 10.93* 7.13* 9.95*    A: ACS vs demand ischemia.  Suspected ischemic cardiomyopathy / acute systolic CHF.  H/o CAD / MI, HTN.  Statin intolerant.  - 10/24/2012: Maintaining hemodynamics but still on IV amiodarone for atrial fibrillation. Suspect MI as the primary etiology resulting in decompensation.   P:  Goal MAP>60 Continue amiodarone per cardiology  Unable to give statins - intolerant No indications for emergent cardiac cath, but will need this admission Metoprolol IV Hold Clonidine Rest per cardiology Fluid removal via dialysis ASa  - Rx start 10/24/12 ok per cards Lovenox Rx dose - started  10/23/12 by cards (confrimed by Dr Teressa Lower)  RENAL  Recent Labs Lab 10/19/12 2101 10/19/12 2234 10/20/12 0750 10/21/12 0533 10/22/12 0525 10/23/12 0440  NA 130* 131* 131* 134* 136 136  K 3.7 3.7 3.7 3.8 4.0 3.8  CL 90* 95* 92* 91* 92* 94*  CO2 22  --  24 23 23 23   GLUCOSE 215* 207* 175* 155* 189* 165*  BUN 60* 62* 64* 74* 74* 71*  CREATININE 3.89* 4.00* 4.00* 4.56* 4.93* 4.85*  CALCIUM 9.8  --   9.1 9.3 8.6 9.0  PHOS  --   --   --  3.7 4.8* 4.2    A:  CKD, pre HD. - 10/24/12: Status post hemodialysis x3 P:    per nephrology   GASTROINTESTINAL  Recent Labs Lab 10/19/12 2101 10/21/12 0533 10/22/12 0525 10/23/12 0440  AST 33  --   --   --   ALT 7  --   --   --   ALKPHOS 109  --   --   --   BILITOT 0.8  --   --   --   PROT 7.4  --   --   --   ALBUMIN 3.2* 2.8* 2.6* 2.6*  INR 1.29  --   --   --     A:  Suspected upper GI hemorrhage. Presented with melena. - 10/22/2012: no evidence of bleeding since admission but having some nonspecific abdominal pain/soreness. Normal lactic acid,  Amylase.  lipase and CK P:   Conservative management according to [facility] at this point in time   HEMATOLOGIC  Recent Labs Lab 10/22/12 0525 10/23/12 0440 10/24/12 0435  HGB 8.8* 8.9* 10.4*  HCT 26.9* 27.2* 32.0*  WBC 16.3* 13.2* 13.1*  PLT 313 309 340    A:  Acute blood loss anemia. Due to GI bleed prior to admission. Status post 2 units transfusion at admission - 10/23/2012: No further evidence of bleeding.  P:  Goal Hb > 8 in setting of acute ischemia  Trend CBC SCDs  INFECTIOUS  Recent Labs Lab 10/19/12 2105 10/20/12 0320 10/21/12 0533 10/22/12 0525 10/22/12 1610  LATICACIDVEN 3.0*  --   --   --  1.8  PROCALCITON  --  2.14 3.53 4.56  --     A:  Suspected HCAP. Status post completion of antibiotics 10/23/2012 that is all empiric - 10/24/2012: No fever P:   Monitor off antibiotics   ENDOCRINE  A:  DM2.  Hyperglycemia. P:   SSI  NEUROLOGIC A:  No active issues. P:   Monitor  Global 10/24/2012::  No family at bedside x 5 days at time of critical care M.D. Rounds. Moved to floor./ [facility] assume primary service and critical care medicine will sign off  Discuss with nephrology and cardiology services        Dr. Kalman Shan, M.D., [facility].C.P Pulmonary and Critical Care Medicine Staff Physician [facility] [place] [facility]  [facility] Pager: [phone], If no answer or between  15:00h - 7:00h: call [phone]  10/24/2012 8:34 AM

## 2012-10-25 LAB — CBC
HCT: 28.8 % — ABNORMAL LOW (ref 39.0–52.0)
Hemoglobin: 9.2 g/dL — ABNORMAL LOW (ref 13.0–17.0)
RBC: 3.76 MIL/uL — ABNORMAL LOW (ref 4.22–5.81)

## 2012-10-25 LAB — RENAL FUNCTION PANEL
BUN: 75 mg/dL — ABNORMAL HIGH (ref 6–23)
CO2: 21 mEq/L (ref 19–32)
Calcium: 8.8 mg/dL (ref 8.4–10.5)
Chloride: 90 mEq/L — ABNORMAL LOW (ref 96–112)
GFR calc Af Amer: 10 mL/min — ABNORMAL LOW (ref >90)
Glucose, Bld: 226 mg/dL — ABNORMAL HIGH (ref 70–99)
Phosphorus: 4.6 mg/dL (ref 2.3–4.6)
Potassium: 3.7 mEq/L (ref 3.5–5.1)
Sodium: 131 mEq/L — ABNORMAL LOW (ref 135–145)

## 2012-10-25 LAB — GLUCOSE, CAPILLARY
Glucose-Capillary: 209 mg/dL — ABNORMAL HIGH (ref 70–99)
Glucose-Capillary: 185 mg/dL — ABNORMAL HIGH (ref 70–99)
Glucose-Capillary: 264 mg/dL — ABNORMAL HIGH (ref 70–99)

## 2012-10-25 MED ORDER — METOPROLOL SUCCINATE 12.5 MG HALF TABLET
12.5000 mg | ORAL_TABLET | Freq: Once | ORAL | Status: AC
  Administered 2012-10-25: 12.5 mg via ORAL
  Filled 2012-10-25: qty 1

## 2012-10-25 MED ORDER — DOCUSATE SODIUM 100 MG PO CAPS
100.0000 mg | ORAL_CAPSULE | Freq: Every day | ORAL | Status: DC
  Administered 2012-10-25 – 2012-10-27 (×3): 100 mg via ORAL
  Filled 2012-10-25 (×3): qty 1

## 2012-10-25 MED ORDER — NEPRO/CARBSTEADY PO LIQD
237.0000 mL | Freq: Two times a day (BID) | ORAL | Status: DC
  Administered 2012-10-25 – 2012-10-27 (×4): 237 mL via ORAL
  Filled 2012-10-25 (×2): qty 237

## 2012-10-25 MED ORDER — HEPARIN SODIUM (PORCINE) 1000 UNIT/ML IJ SOLN
1600.0000 [IU] | Freq: Once | INTRAMUSCULAR | Status: AC
  Administered 2012-10-25: 1600 [IU] via INTRAVENOUS

## 2012-10-25 NOTE — Progress Notes (Signed)
Subjective:  Feels well this am.  Sitting up in chair.  No chest pain.  Coughing up some mucus. Remaining in NSR on IV amiodarone.  Objective:  Vital Signs in the last 24 hours: Temp:  [97.4 F (36.3 C)-98.1 F (36.7 C)] 98.1 F (36.7 C) (03/10 0504) Pulse Rate:  [72-86] 86 (03/10 0504) Resp:  [14-25] 18 (03/10 0504) BP: (85-120)/(49-73) 120/73 mmHg (03/10 0504) SpO2:  [91 %-99 %] 92 % (03/10 0504) Weight:  [176 lb 9.4 oz (80.1 kg)] 176 lb 9.4 oz (80.1 kg) (03/09 2032)  Intake/Output from previous day: 03/09 0701 - 03/10 0700 In: 638.1 [P.O.:570; I.V.:68.1] Out: -  Intake/Output from this shift:    . amiodarone  200 mg Oral BID  . aspirin  81 mg Oral Daily  . darbepoetin (ARANESP) injection - DIALYSIS  60 mcg Intravenous Q Fri-HD  . enoxaparin (LOVENOX) injection  30 mg Subcutaneous Q12H  . insulin aspart  0-9 Units Subcutaneous TID WC  . metoprolol succinate  50 mg Oral BID  . multivitamin  1 tablet Oral QHS  . pantoprazole  40 mg Oral Daily   . nitroGLYCERIN Stopped (10/20/12 0527)    Physical Exam: The patient appears to be in no distress.  Head and neck exam reveals that the pupils are equal and reactive.  The extraocular movements are full.  There is no scleral icterus.  Mouth and pharynx are benign.  No lymphadenopathy.  No carotid bruits.  The jugular venous pressure is normal.  Thyroid is not enlarged or tender.  Chest good aeration bilateral. Mild rhonchi.  Heart reveals no abnormal lift or heave.  First and second heart sounds are normal.  There is no murmur gallop rub or click. Rhythm is regular.  The abdomen is soft and nontender.  Bowel sounds are normoactive.  There is no hepatosplenomegaly or mass.  There are no abdominal bruits.  Extremities reveal no phlebitis or edema.  Pedal pulses are good.  There is no cyanosis or clubbing. Large gouty tophus right elbow.  Neurologic exam is normal strength and no lateralizing weakness.  No sensory  deficits.  Integument reveals no rash   Lab Results:  Recent Labs  10/23/12 0440 10/24/12 0435  WBC 13.2* 13.1*  HGB 8.9* 10.4*  PLT 309 340    Recent Labs  10/23/12 0440  NA 136  K 3.8  CL 94*  CO2 23  GLUCOSE 165*  BUN 71*  CREATININE 4.85*   No results found for this basename: TROPONINI, CK, MB,  in the last 72 hours Hepatic Function Panel  Recent Labs  10/23/12 0440  ALBUMIN 2.6*   No results found for this basename: CHOL,  in the last 72 hours No results found for this basename: PROTIME,  in the last 72 hours  Imaging: Dg Chest Port 1 View  10/24/2012  *RADIOLOGY REPORT*  Clinical Data: Evaluate endotracheal tube position and pleural effusions.  On dialysis.  PORTABLE CHEST - 1 VIEW  Comparison: 10/22/2012  Findings: Bilateral glenohumeral joint osteoarthritis. Cardiomegaly with atherosclerosis in the transverse aorta. Improved to resolved bilateral pleural effusions. No pneumothorax. Improved interstitial edema, mild.  Patchy airspace disease is also improved, most confluent in the right upper and right lower lobes. Improved left base air space disease as well.  IMPRESSION: Improving interstitial and airspace opacities.  Favor interstitial edema and multifocal infection.  Improved to resolved bilateral pleural effusions.   Original Report Authenticated By: Jeronimo Greaves, M.D.     Cardiac Studies: Telemetry  NSR Assessment/Plan:  Paroxysmal atrial fibrillation, now back in NSR. Acute on chronic systolic CHF with  Volume controlled with dialysis. He is maintaining NSR on PO amiodarone.   LOS: 6 days    Elyn Aquas. 10/25/2012, 7:54 AM

## 2012-10-25 NOTE — Progress Notes (Signed)
10/25/12 22:15 Patient's BP 80/50 HR 79,rechecked with manual BP 90/50.Patient denies any c/o.On call MD notified.K.Kirby,NP to place order to give 12.5 mg metoprolol instead of pt's dose of 50 mg.Per MD,ok to give amiodarone.Will continue to monitor. [place], Charito Joselita,RN

## 2012-10-25 NOTE — Progress Notes (Addendum)
PROGRESS NOTE  Nicholas [name]:[id number] DOB: Dec 31, 1940 DOA: 10/19/2012 PCP: Leander Tout, MD  Brief narrative: 72 year old male admitted 10/20/2012 by PCCM with chest pain[recent admission [place]  c low risk myoview] /sputum,P afib, multifocal pneumonia , dark stools [egd 1.14=erythema] c hemoglobin of 7.2-treated with empiric HCAP antibiotics, BiPAP, transfused 2 units packed red blood cells, echocardiogram showed EF 35-40% with moderate hypokinesis of inferior wall Assumed care from critical care and 10/25/2012  Past medical history-As per Problem list Chart reviewed as below- No h/o in Epic  Consultants:  Cardiology  Renal  LINES / TUBES:  BiPAP 10/20/2012 >>10/23/2012   CULTURES:  3/4 Blood >>>  10/19/2012 MRSA PCR is negative  ANTIBIOTICS:  Zosyn 3/4 >>> 10/21/2012  Vancomycin 3/4 >>> 10/21/2012  Levaquin 3/4 >>> 10/23/2012     Subjective  Returned from dialysis.  Feels somewhat washed out and tired States had gone the pain earlier, but has had no stool in the past 4 days No nausea no vomiting Tolerating diet fairly  Objective    Interim History: Physical therapy could not work with him 10/26/38  Telemetry: NSR-has been the case since 10/24/12   Objective: Filed Vitals:   10/25/12 1126 10/25/12 1159 10/25/12 1343 10/25/12 1657  BP: 119/69 118/68 102/66 115/72  Pulse: 88 86 88 83  Temp:  97.5 F (36.4 C) 97.6 F (36.4 C) 98.4 F (36.9 C)  TempSrc:  Oral Oral Oral  Resp: 18 21 16 18   Height:      Weight:  77.5 kg (170 lb 13.7 oz)    SpO2:  100% 91% 95%    Intake/Output Summary (Last 24 hours) at 10/25/12 1721 Last data filed at 10/25/12 1300  Gross per 24 hour  Intake    170 ml  Output   2814 ml  Net  -2644 ml    Exam:  General: Alert pleasant tired Caucasian Cardiovascular: Some S2 regular rate rhythm Respiratory: Clear Abdomen: No added sound Skin no lower extremity edema Neuro grossly intact  Data Reviewed: Basic  Metabolic Panel:  Recent Labs Lab 10/20/12 0750 10/21/12 0533 10/22/12 0525 10/23/12 0440 10/25/12 0904  NA 131* 134* 136 136 131*  K 3.7 3.8 4.0 3.8 3.7  CL 92* 91* 92* 94* 90*  CO2 24 23 23 23 21   GLUCOSE 175* 155* 189* 165* 226*  BUN 64* 74* 74* 71* 75*  CREATININE 4.00* 4.56* 4.93* 4.85* 6.00*  CALCIUM 9.1 9.3 8.6 9.0 8.8  PHOS  --  3.7 4.8* 4.2 4.6   Liver Function Tests:  Recent Labs Lab 10/19/12 2101 10/21/12 0533 10/22/12 0525 10/23/12 0440 10/25/12 0904  AST 33  --   --   --   --   ALT 7  --   --   --   --   ALKPHOS 109  --   --   --   --   BILITOT 0.8  --   --   --   --   PROT 7.4  --   --   --   --   ALBUMIN 3.2* 2.8* 2.6* 2.6* 2.5*    Recent Labs Lab 10/22/12 0900  LIPASE 29  AMYLASE 29   No results found for this basename: AMMONIA,  in the last 168 hours CBC:  Recent Labs Lab 10/19/12 2101  10/21/12 0533 10/22/12 0525 10/23/12 0440 10/24/12 0435 10/25/12 0904  WBC 19.0*  < > 20.9* 16.3* 13.2* 13.1* 13.1*  NEUTROABS 16.4*  --  17.8* 13.8* 10.4* 9.9*  --  HGB 7.2*  < > 9.5* 8.8* 8.9* 10.4* 9.2*  HCT 21.7*  < > 29.1* 26.9* 27.2* 32.0* 28.8*  MCV 71.4*  < > 76.6* 76.0* 75.8* 77.5* 76.6*  PLT 372  < > 319 313 309 340 353  < > = values in this interval not displayed. Cardiac Enzymes:  Recent Labs Lab 10/19/12 2104 10/20/12 0320 10/20/12 0750 10/20/12 1451 10/20/12 2130 10/22/12 0900  CKTOTAL  --   --   --   --   --  21  TROPONINI 6.24* 11.51* 10.93* 7.13* 9.95*  --    BNP: No components found with this basename: POCBNP,  CBG:  Recent Labs Lab 10/24/12 1212 10/24/12 1607 10/24/12 2038 10/25/12 1248 10/25/12 1700  GLUCAP 209* 203* 209* 185* 264*    Recent Results (from the past 240 hour(s))  CULTURE, BLOOD (ROUTINE X 2)     Status: None   Collection Time    10/19/12  9:00 PM      Result Value Range Status   Specimen Description BLOOD RIGHT HAND   Final   Special Requests BOTTLES DRAWN AEROBIC ONLY 10CC   Final    Culture  Setup Time 10/20/2012 03:29   Final   Culture     Final   Value:        BLOOD CULTURE RECEIVED NO GROWTH TO DATE CULTURE WILL BE HELD FOR 5 DAYS BEFORE ISSUING A FINAL NEGATIVE REPORT   Report Status PENDING   Incomplete  CULTURE, BLOOD (ROUTINE X 2)     Status: None   Collection Time    10/19/12  9:25 PM      Result Value Range Status   Specimen Description BLOOD RIGHT ARM   Final   Special Requests BOTTLES DRAWN AEROBIC AND ANAEROBIC   Final   Culture  Setup Time 10/20/2012 03:29   Final   Culture     Final   Value:        BLOOD CULTURE RECEIVED NO GROWTH TO DATE CULTURE WILL BE HELD FOR 5 DAYS BEFORE ISSUING A FINAL NEGATIVE REPORT   Report Status PENDING   Incomplete  MRSA PCR SCREENING     Status: None   Collection Time    10/20/12  2:03 AM      Result Value Range Status   MRSA by PCR NEGATIVE  NEGATIVE Final   Comment:            The GeneXpert MRSA Assay (FDA     approved for NASAL specimens     only), is one component of a     comprehensive MRSA colonization     surveillance program. It is not     intended to diagnose MRSA     infection nor to guide or     monitor treatment for     MRSA infections.  URINE CULTURE     Status: None   Collection Time    10/20/12  9:04 AM      Result Value Range Status   Specimen Description URINE, CATHETERIZED   Final   Special Requests vancomycin, levaquin Normal   Final   Culture  Setup Time 10/20/2012 09:46   Final   Colony Count NO GROWTH   Final   Culture NO GROWTH   Final   Report Status 10/21/2012 FINAL   Final     Studies:              All Imaging reviewed and is as per above notation  Scheduled Meds: . amiodarone  200 mg Oral BID  . aspirin  81 mg Oral Daily  . darbepoetin (ARANESP) injection - DIALYSIS  60 mcg Intravenous Q Fri-HD  . enoxaparin (LOVENOX) injection  30 mg Subcutaneous Q12H  . feeding supplement (NEPRO CARB STEADY)  237 mL Oral BID BM  . insulin aspart  0-9 Units Subcutaneous TID WC  .  metoprolol succinate  50 mg Oral BID  . multivitamin  1 tablet Oral QHS  . pantoprazole  40 mg Oral Daily   Continuous Infusions: . nitroGLYCERIN Stopped (10/20/12 0527)     Assessment/Plan: 1. Acute GI bleed-last seen by gastroenterology 10/21/2012. Hemoglobin currently stable at 9.2. Will reconsult them in a.m. for potential colonoscopy, as only EGD 1.31.14 was performed at [facility] clinic. 2. NSTEMI-GI bleed precluding further work-up? Continue metoprolol 50 twice a day, aspirin 81 daily ACE inhibitor contraindicated secondary to end-stage renal disease  3. Anemia-multifactorial-probably secondary to renal insufficiency and worsened by GI bleeding. Continue Aranesp 60 q week 4. Paroxysmal atrial fibrillation-now in sinus rhythm-continue amiodarone 200 twice a day, metoprolol as above-not coumadin candidate given AGIB. 5. Diabetes mellitus 2-continue sliding scale coverage blood sugars ranging 185-264.  Add Lantus 4 units given renal insuff and altered pharmacokinetics 6. New end-stage renal disease-dialysis ongoing to see his tolerance of the same, ,Might need a lower rate of fluid being taken off with ultrafiltration] 7. 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 ? HCAP-antibiotics discontinued 10/21/12 9. Constipation-start Colace 100 daily 10. Malnutrition-continue renal supplement 237 mils twice a day and vitamins  Code Status: Full Family Communication: none currently at bedside Disposition Plan:  inpatient   Pleas Koch, MD  [facility] Pager [phone] 10/25/2012, 5:21 PM    LOS: 6 days

## 2012-10-25 NOTE — Progress Notes (Signed)
Patient ID: Nicholas [name], male   DOB: 31-Jan-1941, 72 y.o.   MRN: [id number]  [facility] Progress Note    Subjective:   He reports to be feeling better with less shortness of breath. When seen on dialysis, he is sleepy but comfortable.    Objective:   BP 111/67  Pulse 87  Temp(Src) 98.4 F (36.9 C) (Oral)  Resp 18  Ht 5\' 6"  (1.676 m)  Wt 80.9 kg (178 lb 5.6 oz)  BMI 28.8 kg/m2  SpO2 97%  Physical Exam: Gen: Comfortably sleeping on dialysis, easy to awaken and engages conversation CVS: Pulse regular in rate and rhythm, S1 and S2 normal Resp: Fine crackles bilaterally over bases. No wheeze Abd: Soft, flat, nontender and bowel sounds are normal Ext: Trace lower extremity edema  Labs: BMET  Recent Labs Lab 10/19/12 2101 10/19/12 2234 10/20/12 0750 10/21/12 0533 10/22/12 0525 10/23/12 0440 10/25/12 0904  NA 130* 131* 131* 134* 136 136 131*  K 3.7 3.7 3.7 3.8 4.0 3.8 3.7  CL 90* 95* 92* 91* 92* 94* 90*  CO2 22  --  24 23 23 23 21   GLUCOSE 215* 207* 175* 155* 189* 165* 226*  BUN 60* 62* 64* 74* 74* 71* 75*  CREATININE 3.89* 4.00* 4.00* 4.56* 4.93* 4.85* 6.00*  CALCIUM 9.8  --  9.1 9.3 8.6 9.0 8.8  PHOS  --   --   --  3.7 4.8* 4.2 4.6   CBC  Recent Labs Lab 10/21/12 0533 10/22/12 0525 10/23/12 0440 10/24/12 0435 10/25/12 0904  WBC 20.9* 16.3* 13.2* 13.1* 13.1*  NEUTROABS 17.8* 13.8* 10.4* 9.9*  --   HGB 9.5* 8.8* 8.9* 10.4* 9.2*  HCT 29.1* 26.9* 27.2* 32.0* 28.8*  MCV 76.6* 76.0* 75.8* 77.5* 76.6*  PLT 319 313 309 340 353    Medications:    . amiodarone  200 mg Oral BID  . aspirin  81 mg Oral Daily  . darbepoetin (ARANESP) injection - DIALYSIS  60 mcg Intravenous Q Fri-HD  . enoxaparin (LOVENOX) injection  30 mg Subcutaneous Q12H  . insulin aspart  0-9 Units Subcutaneous TID WC  . metoprolol succinate  50 mg Oral BID  . multivitamin  1 tablet Oral QHS  . pantoprazole  40 mg Oral Daily     Assessment/ Plan:   1. Non-ST elevation  myocardial infarction, paroxysmal atrial fibrillation: Currently being medically managed, in sinus rhythm now transitioned over to by mouth amiodarone. Rate controlled with metoprolol twice a day. 2.ESRD: New start hemodialysis after progression from CK D. stage 4/5, continue on a Monday/Wednesday/Friday schedule as the process has been initiated for outpatient dialysis unit placement. Respiratory symptoms/pulmonary edema improving with ultrafiltration on hemodialysis. 3. Anemia: Hemoglobin low, on Aranesp. Followup iron stores. 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 5. Nutrition: Hypoalbuminemic, start nutritional supplements.    Zetta Bills, MD 10/25/2012, 10:58 AM

## 2012-10-25 NOTE — Procedures (Signed)
Patient seen on Hemodialysis. QB 300, UF goal 3500. BP 96/56 Treatment adjusted as needed.  Zetta Bills MD [facility] Office # [phone] Pager # [phone] 10:57 AM

## 2012-10-25 NOTE — Progress Notes (Signed)
PT Cancellation Note  Patient Details Name: Nicholas [name] MRN: [id number] DOB: October 31, 1940   Cancelled Treatment:    Reason Eval/Treat Not Completed: Medical issues which prohibited therapy (I just feel sick) P thad HD this morning and does not fell well this afternoon.  Will check back in am for PT   Donnetta Hail 10/25/2012, 4:43 PM

## 2012-10-26 LAB — CBC
MCH: 25.5 pg — ABNORMAL LOW (ref 26.0–34.0)
MCHC: 32.4 g/dL (ref 30.0–36.0)
MCV: 78.9 fL (ref 78.0–100.0)
Platelets: 330 10*3/uL (ref 150–400)
RBC: 3.88 MIL/uL — ABNORMAL LOW (ref 4.22–5.81)

## 2012-10-26 LAB — RENAL FUNCTION PANEL
CO2: 24 mEq/L (ref 19–32)
Calcium: 8.7 mg/dL (ref 8.4–10.5)
Creatinine, Ser: 4.88 mg/dL — ABNORMAL HIGH (ref 0.50–1.35)
GFR calc non Af Amer: 11 mL/min — ABNORMAL LOW (ref >90)
Phosphorus: 3.2 mg/dL (ref 2.3–4.6)

## 2012-10-26 LAB — GLUCOSE, CAPILLARY: Glucose-Capillary: 229 mg/dL — ABNORMAL HIGH (ref 70–99)

## 2012-10-26 LAB — CULTURE, BLOOD (ROUTINE X 2): Culture: NO GROWTH

## 2012-10-26 MED ORDER — INSULIN GLARGINE 100 UNIT/ML ~~LOC~~ SOLN
4.0000 [IU] | Freq: Every day | SUBCUTANEOUS | Status: DC
  Administered 2012-10-26 – 2012-10-27 (×2): 4 [IU] via SUBCUTANEOUS

## 2012-10-26 MED ORDER — SODIUM CHLORIDE 0.9 % IV SOLN
1020.0000 mg | Freq: Once | INTRAVENOUS | Status: AC
  Administered 2012-10-26: 1020 mg via INTRAVENOUS
  Filled 2012-10-26: qty 34

## 2012-10-26 MED ORDER — ENOXAPARIN SODIUM 30 MG/0.3ML ~~LOC~~ SOLN
30.0000 mg | SUBCUTANEOUS | Status: DC
  Administered 2012-10-26: 30 mg via SUBCUTANEOUS
  Filled 2012-10-26 (×2): qty 0.3

## 2012-10-26 NOTE — Progress Notes (Signed)
PROGRESS NOTE  Nicholas [name] RUE:[id number] DOB: [birth year] DOA: 10/19/2012 PCP: Default, Provider, MD  Brief narrative: 72 year old male admitted 10/20/2012 by PCCM with chest pain[recent admission [facility]  c low risk myoview] /sputum,P afib, multifocal pneumonia , dark stools [egd 1.14=erythema] c hemoglobin of 7.2-treated with empiric HCAP antibiotics, BiPAP, transfused 2 units packed red blood cells, echocardiogram showed EF 35-40% with moderate hypokinesis of inferior wall Assumed care from critical care and 10/25/2012  Past medical history-As per Problem list Chart reviewed as below- No h/o in Epic  Consultants:  Cardiology  Renal  LINES / TUBES:  BiPAP 10/20/2012 >>10/23/2012   CULTURES:  3/4 Blood >>>  10/19/2012 MRSA PCR is negative  ANTIBIOTICS:  Zosyn 3/4 >>> 10/21/2012  Vancomycin 3/4 >>> 10/21/2012  Levaquin 3/4 >>> 10/23/2012     Subjective  Feels much better today-no specific issues. No n/v/cp/sob     Objective    Interim History: Physical therapy could not work with him 10/26/38  Telemetry: NSR-has been the case since 10/24/12   Objective: Filed Vitals:   10/25/12 2145 10/25/12 2203 10/25/12 2330 10/26/12 0439  BP: 80/60 90/50 93/50  104/63  Pulse:  79  76  Temp:  97.8 F (36.6 C)  98.3 F (36.8 C)  TempSrc:  Oral  Oral  Resp:  18  20  Height:      Weight:  78.5 kg (173 lb 1 oz)    SpO2:  95%  90%    Intake/Output Summary (Last 24 hours) at 10/26/12 1118 Last data filed at 10/26/12 1000  Gross per 24 hour  Intake    767 ml  Output   2814 ml  Net  -2047 ml    Exam:  General: Alert pleasant  Caucasian Cardiovascular: S1 S2 regular rate rhythm Respiratory: Clear Abdomen: No added sound Skin no lower extremity edema Neuro grossly intact  Data Reviewed: Basic Metabolic Panel:  Recent Labs Lab 10/21/12 0533 10/22/12 0525 10/23/12 0440 10/25/12 0904 10/26/12 0530  NA 134* 136 136 131* 135  K 3.8 4.0 3.8 3.7 3.6  CL 91*  92* 94* 90* 95*  CO2 23 23 23 21 24   GLUCOSE 155* 189* 165* 226* 204*  BUN 74* 74* 71* 75* 50*  CREATININE 4.56* 4.93* 4.85* 6.00* 4.88*  CALCIUM 9.3 8.6 9.0 8.8 8.7  PHOS 3.7 4.8* 4.2 4.6 3.2   Liver Function Tests:  Recent Labs Lab 10/19/12 2101 10/21/12 0533 10/22/12 0525 10/23/12 0440 10/25/12 0904 10/26/12 0530  AST 33  --   --   --   --   --   ALT 7  --   --   --   --   --   ALKPHOS 109  --   --   --   --   --   BILITOT 0.8  --   --   --   --   --   PROT 7.4  --   --   --   --   --   ALBUMIN 3.2* 2.8* 2.6* 2.6* 2.5* 2.7*    Recent Labs Lab 10/22/12 0900  LIPASE 29  AMYLASE 29   No results found for this basename: AMMONIA,  in the last 168 hours CBC:  Recent Labs Lab 10/19/12 2101  10/21/12 0533 10/22/12 0525 10/23/12 0440 10/24/12 0435 10/25/12 0904 10/26/12 0530  WBC 19.0*  < > 20.9* 16.3* 13.2* 13.1* 13.1* 16.5*  NEUTROABS 16.4*  --  17.8* 13.8* 10.4* 9.9*  --   --  HGB 7.2*  < > 9.5* 8.8* 8.9* 10.4* 9.2* 9.9*  HCT 21.7*  < > 29.1* 26.9* 27.2* 32.0* 28.8* 30.6*  MCV 71.4*  < > 76.6* 76.0* 75.8* 77.5* 76.6* 78.9  PLT 372  < > 319 313 309 340 353 330  < > = values in this interval not displayed. Cardiac Enzymes:  Recent Labs Lab 10/19/12 2104 10/20/12 0320 10/20/12 0750 10/20/12 1451 10/20/12 2130 10/22/12 0900  CKTOTAL  --   --   --   --   --  21  TROPONINI 6.24* 11.51* 10.93* 7.13* 9.95*  --    BNP: No components found with this basename: POCBNP,  CBG:  Recent Labs Lab 10/24/12 2038 10/25/12 1248 10/25/12 1700 10/25/12 2151 10/26/12 0736  GLUCAP 209* 185* 264* 190* 223*    Recent Results (from the past 240 hour(s))  CULTURE, BLOOD (ROUTINE X 2)     Status: None   Collection Time    10/19/12  9:00 PM      Result Value Range Status   Specimen Description BLOOD RIGHT HAND   Final   Special Requests BOTTLES DRAWN AEROBIC ONLY 10CC   Final   Culture  Setup Time 10/20/2012 03:29   Final   Culture NO GROWTH 5 DAYS   Final    Report Status 10/26/2012 FINAL   Final  CULTURE, BLOOD (ROUTINE X 2)     Status: None   Collection Time    10/19/12  9:25 PM      Result Value Range Status   Specimen Description BLOOD RIGHT ARM   Final   Special Requests BOTTLES DRAWN AEROBIC AND ANAEROBIC   Final   Culture  Setup Time 10/20/2012 03:29   Final   Culture NO GROWTH 5 DAYS   Final   Report Status 10/26/2012 FINAL   Final  MRSA PCR SCREENING     Status: None   Collection Time    10/20/12  2:03 AM      Result Value Range Status   MRSA by PCR NEGATIVE  NEGATIVE Final   Comment:            The GeneXpert MRSA Assay (FDA     approved for NASAL specimens     only), is one component of a     comprehensive MRSA colonization     surveillance program. It is not     intended to diagnose MRSA     infection nor to guide or     monitor treatment for     MRSA infections.  URINE CULTURE     Status: None   Collection Time    10/20/12  9:04 AM      Result Value Range Status   Specimen Description URINE, CATHETERIZED   Final   Special Requests vancomycin, levaquin Normal   Final   Culture  Setup Time 10/20/2012 09:46   Final   Colony Count NO GROWTH   Final   Culture NO GROWTH   Final   Report Status 10/21/2012 FINAL   Final     Studies:              All Imaging reviewed and is as per above notation   Scheduled Meds: . amiodarone  200 mg Oral BID  . aspirin  81 mg Oral Daily  . darbepoetin (ARANESP) injection - DIALYSIS  60 mcg Intravenous Q Fri-HD  . docusate sodium  100 mg Oral Daily  . enoxaparin (LOVENOX) injection  30 mg Subcutaneous  Q24H  . feeding supplement (NEPRO CARB STEADY)  237 mL Oral BID BM  . insulin aspart  0-9 Units Subcutaneous TID WC  . metoprolol succinate  50 mg Oral BID  . multivitamin  1 tablet Oral QHS  . pantoprazole  40 mg Oral Daily   Continuous Infusions: . nitroGLYCERIN Stopped (10/20/12 0527)     Assessment/Plan: 1. Acute GI bleed-last seen by gastroenterology 10/21/2012.  Hemoglobin currently stable at 9.2. Will reconsult as out-patient for further work-up given now clinical stability-EGD 1.31.14 showed erythema at [facility] clinic 2. NSTEMI-GI bleed precluding further work-up? Continue metoprolol 50 twice a day, aspirin 81 daily ACE inhibitor contraindicated secondary to end-stage renal disease.  Discussed with Dr. Elease Hashimoto 3.11 who recommends potential out-patient Myoview and other work-up with Dr. Mayford Knife, Cardiologist who saw him initially. 3. Anemia-multifactorial-probably secondary to renal insufficiency and worsened by GI bleeding. Continue Aranesp 60 q week 4. Paroxysmal atrial fibrillation-now in sinus rhythm-continue amiodarone 200 twice a day for 1 week and per Cardiology decrease after this to 200 daily, metoprolol as above-not coumadin candidate given AGIB. 5. Diabetes mellitus 2-continue sliding scale coverage blood sugars ranging 190-223  Add Lantus 4 units given renal insuff and altered pharmacokinetics 6. New end-stage renal disease, potential M/W/F-dialysis ongoing to see his tolerance of the same, [Might need a lower rate of fluid being taken off with ultrafiltration?]-Needs CLIP 7. 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 ? HCAP-antibiotics discontinued 10/21/12 9. Constipation-start Colace 100 daily 10. Malnutrition-continue renal supplement 237 mils twice a day and vitamins  Code Status: Full Family Communication: none currently at bedside Disposition Plan:  inpatient   Pleas Koch, MD  [facility] Pager [phone] 10/26/2012, 11:18 AM    LOS: 7 days

## 2012-10-26 NOTE — Progress Notes (Signed)
   Subjective:  Feels well this am.  Sitting up in chair.  No chest pain.  Coughing up some mucus. Remaining in NSR on PO amiodarone.  Objective:  Vital Signs in the last 24 hours: Temp:  [97.5 F (36.4 C)-98.4 F (36.9 C)] 98.3 F (36.8 C) (03/11 0439) Pulse Rate:  [76-88] 76 (03/11 0439) Resp:  [14-21] 20 (03/11 0439) BP: (80-119)/(50-72) 104/63 mmHg (03/11 0439) SpO2:  [90 %-100 %] 90 % (03/11 0439) Weight:  [170 lb 13.7 oz (77.5 kg)-173 lb 1 oz (78.5 kg)] 173 lb 1 oz (78.5 kg) (03/10 2203)  Intake/Output from previous day: 03/10 0701 - 03/11 0700 In: 410 [P.O.:410] Out: 2814  Intake/Output from this shift: Total I/O In: 357 [P.O.:357] Out: -   . amiodarone  200 mg Oral BID  . aspirin  81 mg Oral Daily  . darbepoetin (ARANESP) injection - DIALYSIS  60 mcg Intravenous Q Fri-HD  . docusate sodium  100 mg Oral Daily  . enoxaparin (LOVENOX) injection  30 mg Subcutaneous Q24H  . feeding supplement (NEPRO CARB STEADY)  237 mL Oral BID BM  . insulin aspart  0-9 Units Subcutaneous TID WC  . metoprolol succinate  50 mg Oral BID  . multivitamin  1 tablet Oral QHS  . pantoprazole  40 mg Oral Daily   . nitroGLYCERIN Stopped (10/20/12 0527)    Physical Exam: The patient appears to be in no distress.  Head and neck exam reveals that the pupils are equal and reactive.  The extraocular movements are full.    Mouth and pharynx are benign.  No lymphadenopathy.  No carotid bruits.  The jugular venous pressure is normal.  Thyroid is not enlarged or tender.  Chest good aeration bilateral. Mild rhonchi.  Heart reveals no abnormal lift or heave.  First and second heart sounds are normal.  There is no murmur gallop rub or click. Rhythm is regular.  The abdomen is soft and nontender.  Bowel sounds are normoactive.  There is no hepatosplenomegaly or mass.  There are no abdominal bruits.  Extremities reveal no phlebitis or edema.  Pedal pulses are good.  There is no cyanosis or clubbing.  Large gouty tophus right elbow.  Neurologic exam is normal strength and no lateralizing weakness.    Integument reveals no rash   Lab Results:  Recent Labs  10/25/12 0904 10/26/12 0530  WBC 13.1* 16.5*  HGB 9.2* 9.9*  PLT 353 330    Recent Labs  10/25/12 0904 10/26/12 0530  NA 131* 135  K 3.7 3.6  CL 90* 95*  CO2 21 24  GLUCOSE 226* 204*  BUN 75* 50*  CREATININE 6.00* 4.88*   No results found for this basename: TROPONINI, CK, MB,  in the last 72 hours Hepatic Function Panel  Recent Labs  10/26/12 0530  ALBUMIN 2.7*   No results found for this basename: CHOL,  in the last 72 hours No results found for this basename: PROTIME,  in the last 72 hours  Imaging: No results found.  Cardiac Studies: Telemetry NSR Assessment/Plan:  Paroxysmal atrial fibrillation, now back in NSR.  Would continue Amiodarone 200 BID for 1 week and then decrease to 200 mg daily.   Acute on chronic systolic CHF with  Volume controlled with dialysis.   Will sign off.  Call for questions.    LOS: 7 days    Elyn Aquas. 10/26/2012, 10:32 AM

## 2012-10-26 NOTE — Evaluation (Signed)
Physical Therapy Evaluation Patient Details Name: Nicholas [name] MRN: [id number] DOB: [birth year] Today's Date: 10/26/2012 Time: 2130-8657 PT Time Calculation (min): 35 min  PT Assessment / Plan / Recommendation Clinical Impression  72 yo male with CKD in preparation for HD admitted with pna. Pt also reports history of arthritis 'all over" and has generalized deconditioning. He lives alone.  He will benefit from continued PT at d/c to improve functional strength and endurance    PT Assessment  Patient needs continued PT services    Follow Up Recommendations  Home health PT;SNF    Does the patient have the potential to tolerate intense rehabilitation      Barriers to Discharge Decreased caregiver support      Equipment Recommendations  None recommended by PT    Recommendations for Other Services     Frequency Min 3X/week    Precautions / Restrictions     Pertinent Vitals/Pain Pt c/o pain on the bottom of his feet      Mobility  Bed Mobility Bed Mobility: Supine to Sit;Sit to Supine Supine to Sit: 5: Supervision Sit to Supine: 5: Supervision Transfers Transfers: Sit to Stand;Stand to Sit Sit to Stand: 5: Supervision Stand to Sit: 5: Supervision Ambulation/Gait Ambulation/Gait Assistance: 4: Min guard Assistive device: Rolling walker Ambulation/Gait Assistance Details: min guard for safety Gait Pattern: Trunk flexed;Narrow base of support;Antalgic Gait velocity: decreased General Gait Details: pt moves slowly and carefully, but uses RW well for  support. Arthritic changes evident Stairs: No Wheelchair Mobility Wheelchair Mobility: No    Exercises Other Exercises Other Exercises: pt instructed in repeated sit to stand for strengthening Other Exercises: advised pt to stay active during the day and do intermittent bouts of activity   PT Diagnosis: Generalized weakness  PT Problem List: Decreased activity tolerance;Decreased mobility;Decreased strength PT  Treatment Interventions: DME instruction;Gait training;Stair training;Functional mobility training;Therapeutic activities;Therapeutic exercise   PT Goals Acute Rehab PT Goals PT Goal Formulation: With patient Time For Goal Achievement: 11/09/12 Potential to Achieve Goals: Good Pt will go Supine/Side to Sit: Independently PT Goal: Supine/Side to Sit - Progress: Goal set today Pt will go Sit to Supine/Side: Independently PT Goal: Sit to Supine/Side - Progress: Goal set today Pt will go Stand to Sit: Independently PT Goal: Stand to Sit - Progress: Goal set today Pt will Ambulate: >150 feet;with modified independence;with rolling walker PT Goal: Ambulate - Progress: Goal set today Pt will Go Up / Down Stairs: 1-2 stairs;with rail(s);with modified independence PT Goal: Up/Down Stairs - Progress: Goal set today  Visit Information  Last PT Received On: 10/26/12    Subjective Data  Subjective: They are trying to get me set up on dialysis monday, wednesday and friday Patient Stated Goal: to go home with his dog   Prior Functioning  Home Living Lives With: Alone Type of Home: House Home Layout: One level Home Adaptive Equipment: Environmental consultant - four wheeled Additional Comments: pt has a small dog Prior Function Level of Independence: Independent with assistive device(s) Driving: Yes Communication Communication: No difficulties    Cognition  Cognition Overall Cognitive Status: Appears within functional limits for tasks assessed/performed Arousal/Alertness: Awake/alert Orientation Level: Appears intact for tasks assessed Behavior During Session: [facility] for tasks performed    Extremity/Trunk Assessment Right Upper Extremity Assessment RUE ROM/Strength/Tone: Deficits RUE ROM/Strength/Tone Deficits: large soft gout deposit at right elboe Right Lower Extremity Assessment RLE ROM/Strength/Tone: Deficits RLE ROM/Strength/Tone Deficits: arthritic changes in LE joints and back with generalized  muscle atrophy RLE Sensation: Deficits  RLE Sensation Deficits: pt reports some altered sensations and pain along the bottom of feet  Left Lower Extremity Assessment LLE ROM/Strength/Tone: Deficits LLE ROM/Strength/Tone Deficits: arthritic changes in LE joints and back with generalized muscle atrophy LLE Sensation: Deficits LLE Sensation Deficits: pt reports some altered sensations and pain along the bottom of feet  Trunk Assessment Trunk Assessment: Other exceptions Trunk Exceptions: arthritic hanges in back   Balance Balance Balance Assessed: Yes Static Sitting Balance Static Sitting - Balance Support: No upper extremity supported;Feet supported Static Sitting - Level of Assistance: 7: Independent Static Standing Balance Static Standing - Balance Support: Bilateral upper extremity supported;During functional activity Static Standing - Level of Assistance: 6: Modified independent (Device/Increase time)  End of Session    GP    Teresa K. [place], [place] [phone] 10/26/2012, 5:35 PM

## 2012-10-26 NOTE — Progress Notes (Signed)
Patient ID: Nicholas [name], male   DOB: Aug 10, 1941, 72 y.o.   MRN: [id number]   [name] Progress Note    Subjective:   Awaiting assistance to be taken to the bathroom. Denies any emerging complaints overnight. No further chest pain or shortness of breath.    Objective:   BP 104/63  Pulse 76  Temp(Src) 98.3 F (36.8 C) (Oral)  Resp 20  Ht 5\' 6"  (1.676 m)  Wt 78.5 kg (173 lb 1 oz)  BMI 27.95 kg/m2  SpO2 90%  Physical Exam: Gen: Uncomfortably resting in bed, needing to have bowel movement CVS: Pulse regular in rate and rhythm, heart sounds S1 and S2 normal Resp: Clear to auscultation bilaterally-anterior chest Abd: Soft, flat, nontender and bowel sounds are normal Ext: Trace lower extremity edema  Labs: BMET  Recent Labs Lab 10/19/12 2101 10/19/12 2234 10/20/12 0750 10/21/12 0533 10/22/12 0525 10/23/12 0440 10/25/12 0904 10/26/12 0530  NA 130* 131* 131* 134* 136 136 131* 135  K 3.7 3.7 3.7 3.8 4.0 3.8 3.7 3.6  CL 90* 95* 92* 91* 92* 94* 90* 95*  CO2 22  --  24 23 23 23 21 24   GLUCOSE 215* 207* 175* 155* 189* 165* 226* 204*  BUN 60* 62* 64* 74* 74* 71* 75* 50*  CREATININE 3.89* 4.00* 4.00* 4.56* 4.93* 4.85* 6.00* 4.88*  CALCIUM 9.8  --  9.1 9.3 8.6 9.0 8.8 8.7  PHOS  --   --   --  3.7 4.8* 4.2 4.6 3.2   CBC  Recent Labs Lab 10/21/12 0533 10/22/12 0525 10/23/12 0440 10/24/12 0435 10/25/12 0904 10/26/12 0530  WBC 20.9* 16.3* 13.2* 13.1* 13.1* 16.5*  NEUTROABS 17.8* 13.8* 10.4* 9.9*  --   --   HGB 9.5* 8.8* 8.9* 10.4* 9.2* 9.9*  HCT 29.1* 26.9* 27.2* 32.0* 28.8* 30.6*  MCV 76.6* 76.0* 75.8* 77.5* 76.6* 78.9  PLT 319 313 309 340 353 330   Medications:    . amiodarone  200 mg Oral BID  . aspirin  81 mg Oral Daily  . darbepoetin (ARANESP) injection - DIALYSIS  60 mcg Intravenous Q Fri-HD  . docusate sodium  100 mg Oral Daily  . enoxaparin (LOVENOX) injection  30 mg Subcutaneous Q12H  . feeding supplement (NEPRO CARB STEADY)  237 mL  Oral BID BM  . insulin aspart  0-9 Units Subcutaneous TID WC  . metoprolol succinate  50 mg Oral BID  . multivitamin  1 tablet Oral QHS  . pantoprazole  40 mg Oral Daily     Assessment/ Plan:   1. Non-ST elevation myocardial infarction, paroxysmal atrial fibrillation: Currently being medically managed, in sinus rhythm now transitioned over to by mouth amiodarone. Rate controlled with metoprolol twice a day.  2.ESRD: New start hemodialysis after progression from CK D. stage 4/5, continue on a Monday/Wednesday/Friday schedule as the process has been initiated for outpatient dialysis unit placement. We'll place on schedule for dialysis tomorrow as we await outpatient disposition/placement. Pulmonary edema/respiratory distress have resolved with ultrafiltration.  3. Anemia: Hemoglobin low, on Aranesp. We'll give intravenous iron. 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  5. Nutrition: Hypoalbuminemic, start nutritional supplements.    Zetta Bills, MD 10/26/2012, 8:43 AM

## 2012-10-27 LAB — RENAL FUNCTION PANEL
BUN: 65 mg/dL — ABNORMAL HIGH (ref 6–23)
Calcium: 8.8 mg/dL (ref 8.4–10.5)
Creatinine, Ser: 5.86 mg/dL — ABNORMAL HIGH (ref 0.50–1.35)
Glucose, Bld: 223 mg/dL — ABNORMAL HIGH (ref 70–99)
Phosphorus: 4.4 mg/dL (ref 2.3–4.6)

## 2012-10-27 LAB — CBC
HCT: 29.5 % — ABNORMAL LOW (ref 39.0–52.0)
Hemoglobin: 9.8 g/dL — ABNORMAL LOW (ref 13.0–17.0)
MCH: 25.8 pg — ABNORMAL LOW (ref 26.0–34.0)
MCHC: 33.2 g/dL (ref 30.0–36.0)
MCV: 77.6 fL — ABNORMAL LOW (ref 78.0–100.0)

## 2012-10-27 LAB — GLUCOSE, CAPILLARY
Glucose-Capillary: 175 mg/dL — ABNORMAL HIGH (ref 70–99)
Glucose-Capillary: 326 mg/dL — ABNORMAL HIGH (ref 70–99)

## 2012-10-27 MED ORDER — ASPIRIN 81 MG PO CHEW: 81.0000 mg | CHEWABLE_TABLET | Freq: Every day | ORAL | Status: DC

## 2012-10-27 MED ORDER — INSULIN GLARGINE 100 UNIT/ML ~~LOC~~ SOLN: 10.0000 [IU] | Freq: Every day | SUBCUTANEOUS | Status: DC

## 2012-10-27 MED ORDER — METOPROLOL SUCCINATE ER 50 MG PO TB24: 50.0000 mg | ORAL_TABLET | Freq: Two times a day (BID) | ORAL | Status: DC

## 2012-10-27 MED ORDER — NEPRO/CARBSTEADY PO LIQD: 237.0000 mL | Freq: Two times a day (BID) | ORAL | Status: DC

## 2012-10-27 MED ORDER — TEARS RENEWED OP SOLN: 2.0000 [drp] | Freq: Four times a day (QID) | OPHTHALMIC | Status: DC | PRN

## 2012-10-27 MED ORDER — AMIODARONE HCL 200 MG PO TABS: 200.0000 mg | ORAL_TABLET | Freq: Two times a day (BID) | ORAL | Status: DC

## 2012-10-27 MED ORDER — RENA-VITE PO TABS: 1.0000 | ORAL_TABLET | Freq: Every day | ORAL | Status: DC

## 2012-10-27 MED ORDER — DARBEPOETIN ALFA-POLYSORBATE 60 MCG/0.3ML IJ SOLN: 60.0000 ug | INTRAMUSCULAR | Status: DC

## 2012-10-27 NOTE — Progress Notes (Signed)
Discussed discharge instructions and medications with pt. Pt showed no barriers to discharge. IV removed. Tele removed. Pt discharged to home with family. Assessment unchanged from morning. Home health PT/OT has been set up by case manager. Pt aware of outpatient dialysis schedule, and has no questions, plans to be there on Friday.

## 2012-10-27 NOTE — Procedures (Signed)
Patient seen on Hemodialysis. QB 350, UF goal 2600 (decreased from 3600 due to hypotension at last HD Tx) BP 109/61 Treatment adjusted as needed.  Zetta Bills MD [facility]. Office # [phone] Pager # [phone] 8:29 AM

## 2012-10-27 NOTE — Progress Notes (Signed)
Agree with update in discharge plan  10/27/2012 Corlis Hove, PT [phone]

## 2012-10-27 NOTE — Care Management Note (Signed)
    Page 1 of 2   10/27/2012     2:29:10 PM   CARE MANAGEMENT NOTE 10/27/2012  Patient:  Nicholas [name], Nicholas [name]   Account Number:  [id number]  Date Initiated:  10/27/2012  Documentation initiated by:  Darlyne Russian  Subjective/Objective Assessment:   Patient admitted with chest pain, cough.  BUN 60 CR 3.89, GI bleed HgB 7.2, 2 units blood.  PMH: Renal disease with fistula placed 2013 not in use.  [facility] HD: 3/6 HD     Action/Plan:   Progression of care and discharge planning   Anticipated DC Date:     Anticipated DC Plan:    In-house referral  Clinical Social Worker      DC Planning Services  CM consult      Choice offered to / List presented to:  C-1 Patient        HH arranged  HH-1 RN  HH-10 DISEASE MANAGEMENT  HH-2 PT  HH-3 OT      HH agency  [facility]   Status of service:  Completed, signed off Medicare Important Message given?   (If response is "NO", the following Medicare IM given date fields will be blank) Date Medicare IM given:   Date Additional Medicare IM given:    Discharge Disposition:  HOME W HOME HEALTH SERVICES  Per UR Regulation:  Reviewed for med. necessity/level of care/duration of stay  If discussed at Long Length of Stay Meetings, dates discussed:    Comments:  10-27-12 1422 Roselyn Bering- Mitzie Na, RN, BSN [phone] CM did speak to pt and he has his brother to pick him up today- he only lives a mile away from him. His daughter will come in town tonight and will stay the weekend. Pt is agreeable to [facility] services with [facility] RN, PT, OT. SOC to begin within 24-48 hours post d/c. Pt states he has Rollator walker with seat and 3n1 at home. Pt staes he will ask his brother to take him to HD treatments. Pt days are M,W,F. No further needs from CM at this time.     10/27/2012  1000 Darlyne Russian RN, [place]  [phone]  [facility] HD:  first treatment  10/21/2012  CLIP:  [facility]  MWF 2nd shift  can start Friday 10/29/2012 at 9:30 am  CM referral:   [facility] PT/OT

## 2012-10-27 NOTE — Progress Notes (Signed)
Patient ID: Nicholas [name], male   DOB: 1941-07-30, 72 y.o.   MRN: [id number]   [facility] Progress Note    Subjective:   Reports an uncomfortable night with "burning eyes"- enjoyed PT and ambulation yesterday without problems. Denies CP or SOB   Objective:   BP 108/50  Pulse 75  Temp(Src) 97.8 F (36.6 C) (Oral)  Resp 17  Ht 5\' 6"  (1.676 m)  Wt 79.8 kg (175 lb 14.8 oz)  BMI 28.41 kg/m2  SpO2 98%  Intake/Output Summary (Last 24 hours) at 10/27/12 0819 Last data filed at 10/27/12 0335  Gross per 24 hour  Intake    597 ml  Output    350 ml  Net    247 ml   Weight change: -1.021 kg (-2 lb 4 oz)  Physical Exam: UUV:[id number] resting on HD QQV:ZDGLO RRR, normal S1 and S2  Resp:Decreased basal BS bilaterally, no rales/rhonchi VFI:EPPI, flat, NT, BS normal Ext:No Le edema  Imaging: No results found.  Labs: BMET  Recent Labs Lab 10/21/12 0533 10/22/12 0525 10/23/12 0440 10/25/12 0904 10/26/12 0530  NA 134* 136 136 131* 135  K 3.8 4.0 3.8 3.7 3.6  CL 91* 92* 94* 90* 95*  CO2 23 23 23 21 24   GLUCOSE 155* 189* 165* 226* 204*  BUN 74* 74* 71* 75* 50*  CREATININE 4.56* 4.93* 4.85* 6.00* 4.88*  CALCIUM 9.3 8.6 9.0 8.8 8.7  PHOS 3.7 4.8* 4.2 4.6 3.2   CBC  Recent Labs Lab 10/21/12 0533 10/22/12 0525 10/23/12 0440 10/24/12 0435 10/25/12 0904 10/26/12 0530  WBC 20.9* 16.3* 13.2* 13.1* 13.1* 16.5*  NEUTROABS 17.8* 13.8* 10.4* 9.9*  --   --   HGB 9.5* 8.8* 8.9* 10.4* 9.2* 9.9*  HCT 29.1* 26.9* 27.2* 32.0* 28.8* 30.6*  MCV 76.6* 76.0* 75.8* 77.5* 76.6* 78.9  PLT 319 313 309 340 353 330    Medications:    . amiodarone  200 mg Oral BID  . aspirin  81 mg Oral Daily  . darbepoetin (ARANESP) injection - DIALYSIS  60 mcg Intravenous Q Fri-HD  . docusate sodium  100 mg Oral Daily  . enoxaparin (LOVENOX) injection  30 mg Subcutaneous Q24H  . feeding supplement (NEPRO CARB STEADY)  237 mL Oral BID BM  . insulin aspart  0-9 Units Subcutaneous  TID WC  . insulin glargine  4 Units Subcutaneous Daily  . metoprolol succinate  50 mg Oral BID  . multivitamin  1 tablet Oral QHS  . pantoprazole  40 mg Oral Daily     Assessment/ Plan:   1. Non-ST elevation myocardial infarction, paroxysmal atrial fibrillation: Medically managed, appears to be in normal sinus rhythm/rate controlled on PO amiodarone/ Metoprolol.  2.ESRD: New start hemodialysis after progression from CK D. stage 5, continue on a Monday/Wednesday/Friday schedule. Accepted to [facility] on MWF second shift-- may be able to DC today to start HD on Friday (if no barriers to DC noted by the hospitalist service). Pulmonary edema/respiratory distress have resolved with ultrafiltration.  3. Anemia: Hemoglobin low, on Aranesp. 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  5. Nutrition: Hypoalbuminemic, start nutritional supplements.   Zetta Bills, MD 10/27/2012, 8:19 AM

## 2012-10-27 NOTE — Progress Notes (Signed)
Physical Therapy Treatment Patient Details Name: Nicholas [name] MRN: [id number] DOB: 01/14/1941 Today's Date: 10/27/2012 Time: 4098-1191 PT Time Calculation (min): 23 min  PT Assessment / Plan / Recommendation Comments on Treatment Session  Pt is a 72 y.o./M with CKD in preparation for diaylisis who was admitted with pna. Also reports having "arthritis' all over. Pt progressing with mobility today with incr activity performed (stairs) with less assistance/cues needed. Does need incr time with mobilty due to arthritic joints. Would benefit from HHPT for safety, strengthening and conditioning with now starting dialysis and living alone with decr caregiver support. Pt does have his daughter coming to stay with him for 2-3 days after discharge and his brother live 2 door down from him.           Follow Up Recommendations  Home health PT           Equipment Recommendations  None recommended by PT       Frequency Min 3X/week   Plan Discharge plan remains appropriate;Frequency remains appropriate    Precautions / Restrictions Precautions Precautions: None Restrictions Weight Bearing Restrictions: No       Mobility  Bed Mobility Bed Mobility: Not assessed Details for Bed Mobility Assistance: pt seated EOB upon arrival and to recliner after session. Transfers Sit to Stand: 5: Supervision;From bed;From chair/3-in-1;With upper extremity assist;With armrests Stand to Sit: 5: Supervision;To chair/3-in-1;With upper extremity assist;With armrests Details for Transfer Assistance: no cues or assistance needed with transfers x2 reps Ambulation/Gait Ambulation/Gait Assistance: 5: Supervision Ambulation Distance (Feet): 20 Feet Assistive device: Rolling walker Ambulation/Gait Assistance Details: cues for walker proximity with gait and for upright posture. Gait Pattern: Step-through pattern;Narrow base of support;Trunk flexed Gait velocity: Decreased General Gait Details: pt moves slowly and  carefully, but uses RW well for  support. Arthritic changes evident Stairs: Yes Stairs Assistance: 4: Min guard Stairs Assistance Details (indicate cue type and reason): cues to ascend with stronger leg first and descend with weaker leg first Stair Management Technique: Two rails;Step to pattern;Forwards Number of Stairs: 2      PT Goals Acute Rehab PT Goals PT Goal: Supine/Side to Sit - Progress: Progressing toward goal PT Goal: Stand to Sit - Progress: Progressing toward goal PT Goal: Ambulate - Progress: Progressing toward goal PT Goal: Up/Down Stairs - Progress: Progressing toward goal  Visit Information  Last PT Received On: 10/27/12 Assistance Needed: +1    Subjective Data  Subjective: No new complaints, does feel some fatique after HD this am. Agreeable to therapy at this time.   Cognition  Cognition Overall Cognitive Status: Appears within functional limits for tasks assessed/performed Arousal/Alertness: Awake/alert Orientation Level: Appears intact for tasks assessed Behavior During Session: [facility] for tasks performed       End of Session PT - End of Session Equipment Utilized During Treatment: Gait belt Activity Tolerance: Patient tolerated treatment well Patient left: in chair;with call bell/phone within reach Nurse Communication: Mobility status   GP     Sallyanne Kuster 10/27/2012, 2:23 PM  Sallyanne Kuster, PTA Office- [phone]

## 2012-10-27 NOTE — Discharge Summary (Signed)
Physician Discharge Summary  Nicholas [name] ZOX:[id number] DOB: Nov 21, 1940 DOA: 10/19/2012  PCP: Default, Provider, MD  Admit date: 10/19/2012 Discharge date: 10/27/2012  Time spent: >30 minutes  Recommendations for Outpatient Follow-up:  1. Needs follow up with cardiology service (Dr. Mayford Knife) for outpatient [facility] and further work up/ medication adjustment for A. Fib if needed. 2. Reevaluate CBG's and adjust insulin dose.  Discharge Diagnoses:  Active Problems:   ACS (acute coronary syndrome)   HCAP (healthcare-associated pneumonia)   Acute respiratory failure with hypoxia   Acute blood loss anemia   CKD (chronic kidney disease)   Acute upper GI hemorrhage   DM (diabetes mellitus)   Hyperglycemia   PAF (paroxysmal atrial fibrillation)   Ischemic cardiomyopathy   Acute systolic HF (heart failure)   Discharge Condition: stable and improved. Discharge home with [facility] services for deconditioning.  Diet recommendation: heart healthy and low carbohydrates.  Filed Weights   10/26/12 2116 10/27/12 0720 10/27/12 1150  Weight: 79.878 kg (176 lb 1.6 oz) 79.8 kg (175 lb 14.8 oz) 77.4 kg (170 lb 10.2 oz)    History of present illness:  72 yo with CKD in preparation for HD with burning chest pain and cough productive of yellow sputum x 2 days. There was no hemoptysis. Denies fever. Reports black stool on day of admission. No abdominal pain, nausea or vomiting. Recent admission in [place] with reportedly negative cardiac workup. In ED: Bilateral airspace disease on CXR, hypoxia requiring NRB, Troponin 6, Hb 7>>>6 >>> transfused 2U of PRBCs.  Hospital Course:  1. Non-ST elevation myocardial infarction, paroxysmal atrial fibrillation: Medically managed, appears to be in normal sinus rhythm/rate controlled on PO amiodarone/ Metoprolol. Per cardiology amiodarone will be decreased to once a day after 1 week of treatment. No anticoagulation secondary to acute GI bleed. Continue ASA.  2.ESRD: New  start hemodialysis after progression from CK D. stage 5, continue on a Monday/Wednesday/Friday schedule. Accepted to [facility] on MWF second shift.  3.Pulmonary edema/respiratory distress: have resolved with ultrafiltration.   4. Anemia: Hemoglobin at discharge 9.8, on Aranesp. S/P iron IV and 2 units PRBC's during admission.  5. Nutrition: Hypoalbuminemic, started on Nepro BID.  6.DM: continue lantus and SSI; will follow with PCP to adjust insulin dose.  7.HCAP: treatment completed while inside the hospital. Denies SOB, good O2 sat on RA.  8.CHF c Ef 35-40% ICM likely 2/2 to NSTEMI: compensated and volume now regulated with HD. Will follow with cardiology service as an outpatient for further work up and treatment.  9.deconditioning: HHPT/HHOT to be arranged at discharge.   Procedures:  See below for x-ray reports  Consultations:  CCM  Renal  Cardiology  Discharge Exam: Filed Vitals:   10/27/12 1030 10/27/12 1100 10/27/12 1130 10/27/12 1150  BP: 119/77 121/73 110/51 128/77  Pulse: 82 81 88 88  Temp:    98.1 F (36.7 C)  TempSrc:    Oral  Resp:    16  Height:      Weight:    77.4 kg (170 lb 10.2 oz)  SpO2:    99%   General:NAD  Cardiovascular: S1 S2 regular rate and rhythm  Respiratory: Clear  Abdomen: Soft, NT, ND, positive BS  Extremities: no edema, no cyanosis  Neuro: grossly intact  Discharge Instructions  Discharge Orders   Future Orders Complete By Expires     Diet - low sodium heart healthy  As directed     Discharge instructions  As directed     Comments:      -  Take medications as prescribed -Arrange follow up with PCP in 2 weeks -Follow up on Friday at outpatient HD unit second shift for Hemodialysis treatment.. -Follow a low sodium diet -Arrange follow up with cardiology service as an outpatient.    Increase activity slowly  As directed         Medication List    STOP taking these medications       amLODipine 5 MG tablet  Commonly known as:   NORVASC     cloNIDine 0.1 MG tablet  Commonly known as:  CATAPRES     furosemide 80 MG tablet  Commonly known as:  LASIX      TAKE these medications       allopurinol 100 MG tablet  Commonly known as:  ZYLOPRIM  Take 100 mg by mouth daily.     amiodarone 200 MG tablet  Commonly known as:  PACERONE  Take 1 tablet (200 mg total) by mouth 2 (two) times daily. Until 10/31/12; then start taking 200mg  by mouth daily.     aspirin 81 MG chewable tablet  Chew 1 tablet (81 mg total) by mouth daily.     darbepoetin 60 MCG/0.3ML Soln  Commonly known as:  ARANESP  Inject 0.3 mLs (60 mcg total) into the vein every Friday with hemodialysis.     feeding supplement (NEPRO CARB STEADY) Liqd  Take 237 mLs by mouth 2 (two) times daily between meals.     fish oil-omega-3 fatty acids 1000 MG capsule  Take 1 g by mouth daily.     gabapentin 300 MG capsule  Commonly known as:  NEURONTIN  Take 600 mg by mouth 2 (two) times daily.     insulin aspart protamine-insulin aspart (70-30) 100 UNIT/ML injection  Commonly known as:  NOVOLOG 70/30  Inject 2-10 Units into the skin 3 (three) times daily as needed (Per sliding scale, based on sugar levels).     insulin glargine 100 UNIT/ML injection  Commonly known as:  LANTUS  Inject 10 Units into the skin at bedtime.     metoprolol succinate 50 MG 24 hr tablet  Commonly known as:  TOPROL-XL  Take 1 tablet (50 mg total) by mouth 2 (two) times daily. Take with or immediately following a meal.     multivitamin Tabs tablet  Take 1 tablet by mouth at bedtime.     pantoprazole 40 MG tablet  Commonly known as:  PROTONIX  Take 40 mg by mouth daily.     sevelamer carbonate 800 MG tablet  Commonly known as:  RENVELA  Take 800 mg by mouth 3 (three) times daily with meals.     sodium bicarbonate 650 MG tablet  Take 650 mg by mouth 2 (two) times daily.     tamsulosin 0.4 MG Caps  Commonly known as:  FLOMAX  Take 0.4 mg by mouth at bedtime.      traMADol 50 MG tablet  Commonly known as:  ULTRAM  Take 50 mg by mouth every 6 (six) hours as needed for pain.           Follow-up Information   Follow up with Quintella Reichert, MD. Schedule an appointment as soon as possible for a visit in 2 weeks.   Contact information:   [address] [place] [address] [phone]        The results of significant diagnostics from this hospitalization (including imaging, microbiology, ancillary and laboratory) are listed below for reference.    Significant  Diagnostic Studies: Dg Chest 2 View  10/19/2012  *RADIOLOGY REPORT*  Clinical Data: Chest pain and shortness of breath.  CHEST - 2 VIEW  Comparison: None.  Findings: There are extensive bilateral airspace disease and small pleural effusions.  No pneumothorax is identified.  There is cardiomegaly.  IMPRESSION: Extensive bilateral airspace disease with pleural effusions could be due to multi focal pneumonia or pulmonary edema.   Original Report Authenticated By: Holley Dexter, M.D.    Dg Chest Port 1 View  10/24/2012  *RADIOLOGY REPORT*  Clinical Data: Evaluate endotracheal tube position and pleural effusions.  On dialysis.  PORTABLE CHEST - 1 VIEW  Comparison: 10/22/2012  Findings: Bilateral glenohumeral joint osteoarthritis. Cardiomegaly with atherosclerosis in the transverse aorta. Improved to resolved bilateral pleural effusions. No pneumothorax. Improved interstitial edema, mild.  Patchy airspace disease is also improved, most confluent in the right upper and right lower lobes. Improved left base air space disease as well.  IMPRESSION: Improving interstitial and airspace opacities.  Favor interstitial edema and multifocal infection.  Improved to resolved bilateral pleural effusions.   Original Report Authenticated By: Jeronimo Greaves, M.D.    Dg Chest Port 1 View  10/22/2012  *RADIOLOGY REPORT*  Clinical Data: Acute respiratory failure.  PORTABLE CHEST - 1 VIEW  Comparison: 10/21/2012  and 10/19/2012.  Findings: 0931 hours.  Again demonstrated are diffuse bilateral air space opacities.  There has been interval worsening of the right upper lobe component.  There are bilateral pleural effusions. Cardiomegaly and aortic atherosclerosis are stable.  There is no pneumothorax.  Bilateral glenohumeral arthropathy again noted.  IMPRESSION: Persistent bilateral air space opacities with interval worsening of the right upper lobe component again concerning for multilobar infection.  Bilateral pleural effusions.   Original Report Authenticated By: Carey Bullocks, M.D.    Dg Chest Port 1 View  10/21/2012  *RADIOLOGY REPORT*  Clinical Data: Evaluate respiratory failure  PORTABLE CHEST - 1 VIEW  Comparison: 10/19/2012  Findings: Grossly unchanged enlarged cardiac silhouette and mediastinal contours with atherosclerotic calcifications within a mildly tortuous and possibly slightly ectatic thoracic aorta. Grossly unchanged extensive bilateral heterogeneous air space opacities.  Pulmonary vasculature is indistinct.  Query trace bilateral effusions.  No pneumothorax.  Grossly unchanged bones including suspected bilateral glenohumeral joint degenerative change.  IMPRESSION: Grossly unchanged extensive bilateral heterogeneous air space opacities worrisome for multifocal infection but underlying pulmonary edema is not excluded. A follow-up chest radiograph in 4 to 6 weeks after treatment is recommended to ensure resolution.   Original Report Authenticated By: Tacey Ruiz, MD     Microbiology: Recent Results (from the past 240 hour(s))  CULTURE, BLOOD (ROUTINE X 2)     Status: None   Collection Time    10/19/12  9:00 PM      Result Value Range Status   Specimen Description BLOOD RIGHT HAND   Final   Special Requests BOTTLES DRAWN AEROBIC ONLY 10CC   Final   Culture  Setup Time 10/20/2012 03:29   Final   Culture NO GROWTH 5 DAYS   Final   Report Status 10/26/2012 FINAL   Final  CULTURE, BLOOD (ROUTINE X  2)     Status: None   Collection Time    10/19/12  9:25 PM      Result Value Range Status   Specimen Description BLOOD RIGHT ARM   Final   Special Requests BOTTLES DRAWN AEROBIC AND ANAEROBIC   Final   Culture  Setup Time 10/20/2012 03:29   Final  Culture NO GROWTH 5 DAYS   Final   Report Status 10/26/2012 FINAL   Final  MRSA PCR SCREENING     Status: None   Collection Time    10/20/12  2:03 AM      Result Value Range Status   MRSA by PCR NEGATIVE  NEGATIVE Final   Comment:            The GeneXpert MRSA Assay (FDA     approved for NASAL specimens     only), is one component of a     comprehensive MRSA colonization     surveillance program. It is not     intended to diagnose MRSA     infection nor to guide or     monitor treatment for     MRSA infections.  URINE CULTURE     Status: None   Collection Time    10/20/12  9:04 AM      Result Value Range Status   Specimen Description URINE, CATHETERIZED   Final   Special Requests vancomycin, levaquin Normal   Final   Culture  Setup Time 10/20/2012 09:46   Final   Colony Count NO GROWTH   Final   Culture NO GROWTH   Final   Report Status 10/21/2012 FINAL   Final     Labs: Basic Metabolic Panel:  Recent Labs Lab 10/22/12 0525 10/23/12 0440 10/25/12 0904 10/26/12 0530 10/27/12 0811  NA 136 136 131* 135 131*  K 4.0 3.8 3.7 3.6 4.0  CL 92* 94* 90* 95* 90*  CO2 23 23 21 24 21   GLUCOSE 189* 165* 226* 204* 223*  BUN 74* 71* 75* 50* 65*  CREATININE 4.93* 4.85* 6.00* 4.88* 5.86*  CALCIUM 8.6 9.0 8.8 8.7 8.8  PHOS 4.8* 4.2 4.6 3.2 4.4   Liver Function Tests:  Recent Labs Lab 10/22/12 0525 10/23/12 0440 10/25/12 0904 10/26/12 0530 10/27/12 0811  ALBUMIN 2.6* 2.6* 2.5* 2.7* 2.8*    Recent Labs Lab 10/22/12 0900  LIPASE 29  AMYLASE 29   CBC:  Recent Labs Lab 10/21/12 0533 10/22/12 0525 10/23/12 0440 10/24/12 0435 10/25/12 0904 10/26/12 0530 10/27/12 0811  WBC 20.9* 16.3* 13.2* 13.1* 13.1* 16.5*  13.6*  NEUTROABS 17.8* 13.8* 10.4* 9.9*  --   --   --   HGB 9.5* 8.8* 8.9* 10.4* 9.2* 9.9* 9.8*  HCT 29.1* 26.9* 27.2* 32.0* 28.8* 30.6* 29.5*  MCV 76.6* 76.0* 75.8* 77.5* 76.6* 78.9 77.6*  PLT 319 313 309 340 353 330 342   Cardiac Enzymes:  Recent Labs Lab 10/20/12 1451 10/20/12 2130 10/22/12 0900  CKTOTAL  --   --  21  TROPONINI 7.13* 9.95*  --    BNP: BNP (last 3 results)  Recent Labs  10/20/12 0320  PROBNP 38564.0*   CBG:  Recent Labs Lab 10/26/12 0736 10/26/12 1121 10/26/12 1642 10/26/12 2049 10/27/12 1247  GLUCAP 223* 229* 239* 230* 175*    Signed:  Lucette Kratz  [facility] 10/27/2012, 1:05 PM

## 2012-11-25 ENCOUNTER — Ambulatory Visit: Payer: Self-pay | Admitting: Vascular Surgery

## 2012-11-25 LAB — CBC
HCT: 35.1 % — ABNORMAL LOW (ref 40.0–52.0)
HGB: 11.3 g/dL — ABNORMAL LOW (ref 13.0–18.0)
MCV: 90 fL (ref 80–100)
Platelet: 291 10*3/uL (ref 150–440)
RBC: 3.9 10*6/uL — ABNORMAL LOW (ref 4.40–5.90)
RDW: 29.3 % — ABNORMAL HIGH (ref 11.5–14.5)
WBC: 9 10*3/uL (ref 3.8–10.6)

## 2012-11-25 LAB — BASIC METABOLIC PANEL
Anion Gap: 7 (ref 7–16)
BUN: 33 mg/dL — ABNORMAL HIGH (ref 7–18)
Calcium, Total: 8.8 mg/dL (ref 8.5–10.1)
Chloride: 99 mmol/L (ref 98–107)
Creatinine: 3.54 mg/dL — ABNORMAL HIGH (ref 0.60–1.30)
EGFR (African American): 19 — ABNORMAL LOW
EGFR (Non-African Amer.): 17 — ABNORMAL LOW
Osmolality: 281 (ref 275–301)
Potassium: 4.2 mmol/L (ref 3.5–5.1)
Sodium: 137 mmol/L (ref 136–145)

## 2012-11-30 ENCOUNTER — Encounter: Payer: Self-pay | Admitting: Cardiovascular Disease

## 2012-11-30 ENCOUNTER — Ambulatory Visit (INDEPENDENT_AMBULATORY_CARE_PROVIDER_SITE_OTHER): Payer: Medicare Other | Admitting: Cardiovascular Disease

## 2012-11-30 VITALS — BP 120/68 | HR 63 | Ht 66.0 in | Wt 177.8 lb

## 2012-11-30 DIAGNOSIS — N189 Chronic kidney disease, unspecified: Secondary | ICD-10-CM

## 2012-11-30 DIAGNOSIS — K922 Gastrointestinal hemorrhage, unspecified: Secondary | ICD-10-CM

## 2012-11-30 DIAGNOSIS — I249 Acute ischemic heart disease, unspecified: Secondary | ICD-10-CM

## 2012-11-30 DIAGNOSIS — I5021 Acute systolic (congestive) heart failure: Secondary | ICD-10-CM

## 2012-11-30 DIAGNOSIS — I480 Paroxysmal atrial fibrillation: Secondary | ICD-10-CM

## 2012-11-30 DIAGNOSIS — I214 Non-ST elevation (NSTEMI) myocardial infarction: Secondary | ICD-10-CM

## 2012-11-30 DIAGNOSIS — I200 Unstable angina: Secondary | ICD-10-CM

## 2012-11-30 DIAGNOSIS — I4891 Unspecified atrial fibrillation: Secondary | ICD-10-CM

## 2012-11-30 DIAGNOSIS — I779 Disorder of arteries and arterioles, unspecified: Secondary | ICD-10-CM

## 2012-11-30 NOTE — Assessment & Plan Note (Signed)
He remains on amiodarone 200 mg a day. Hopefully, he will not have to be on amiodarone longer than 2 or 3 months. His episode of paroxysmal atrial fibrillation was in the setting of severe respiratory failure and it is possible that he does not have further episodes of atrial fibrillation. He's not a Coumadin candidate based on his recent significant GI bleed with life-threatening anemia.

## 2012-11-30 NOTE — Assessment & Plan Note (Signed)
He has a known right carotid stenosis. I did not hear a bruit today. Given his recent hospitalization with multiple complicating factors, I do not think that we should proceed with carotid surgery until we repeat his Myoview study and echocardiogram. We will send Dr. Wyn Quaker a message.  ([facility] - [phone])

## 2012-11-30 NOTE — Progress Notes (Signed)
Nicholas [name] Date of Birth  1941/05/15       [facility] [address], [address]  [address] [name], [place]  [address]   [place], [place]  [address] [phone]     [phone]   Fax  [phone]    Fax [phone]  Problem List: 1. Chronic kidney disease-on hemodialysis 2. Paroxysmal Atrial fibrillation 3. Chronic congestive heart failure 4. Recent hospitalization with pneumonia 5. Diabetes mellitus 6. chronic systolic congestive heart failure 7. Mild pulmonary hypertension 8. GI bleed 9. Anemia 10. Right Carotid artery stenosis   - Left ventricle: The cavity size was normal. There was mild concentric hypertrophy. Systolic function was moderately reduced. The estimated ejection fraction was in the range of 35% to 40%. There is moderate hypokinesis of the entireinferior myocardium. Left ventricular diastolic function parameters were normal. - Aortic valve: Moderate diffuse thickening and calcification, consistent with sclerosis. - Mitral valve: Moderate to severe regurgitation directed eccentrically and posteriorly. - Left atrium: The atrium was mildly dilated. - Right ventricle: The cavity size was mildly dilated. Systolic function was moderately reduced. - Right atrium: The atrium was mildly dilated. - Atrial septum: No defect or patent foramen ovale was identified. - Pulmonary arteries: PA peak pressure: 46mm Hg (S). Impressions:  - The right ventricular systolic pressure was increased consistent with mild pulmonary hypertension   History of Present Illness:  Nicholas [name] is a 72 year old gentleman with the above-noted medical history. He was admitted to the hospital in March for pneumonia, anemia ( Hb of 6), GI bleed,  NSTEMI.  He is now back home.  He is now on hemodialysis.  He's not had any episodes of chest pain since he left the hospital. He's not having any shortness breath. He's been able to  clean his house and do his own cooking without severe shortness of breath, chest pain or dizziness.  He has not had any palpitations.  He is walking with a walker or cane.    He is scheduled to have right CEA at [facility] on April 17 ( 2 days from now).  He has had a [employer] as part of a pre-op evaluation.  His ejection fraction was 56% at that time.  Since that study was done, he was admitted to [facility] with pneumonia, anemia, NSTEMI, worsening renal failure. He had respiratory failure and was on a ventilator for several days.  Echo at that time revealed inferior hypokinesis and an ejection fraction of 35% to 40%  Current Outpatient Prescriptions on File Prior to Visit  Medication Sig Dispense Refill  . allopurinol (ZYLOPRIM) 100 MG tablet Take 100 mg by mouth daily.      [name] aspirin 81 MG chewable tablet Chew 1 tablet (81 mg total) by mouth daily.      . darbepoetin (ARANESP) 60 MCG/0.3ML SOLN Inject 0.3 mLs (60 mcg total) into the vein every Friday with hemodialysis.  4.2 mL    . fish oil-omega-3 fatty acids 1000 MG capsule Take 1 g by mouth daily.      [name] gabapentin (NEURONTIN) 300 MG capsule Take 600 mg by mouth 2 (two) times daily.       . insulin aspart protamine-insulin aspart (NOVOLOG 70/30) (70-30) 100 UNIT/ML injection Inject 2-10 Units into the skin 3 (three) times daily as needed (Per sliding scale, based on sugar levels).      . metoprolol succinate (TOPROL-XL) 50 MG 24 hr tablet Take  1 tablet (50 mg total) by mouth 2 (two) times daily. Take with or immediately following a meal.  30 tablet  1  . Nutritional Supplements (FEEDING SUPPLEMENT, NEPRO CARB STEADY,) LIQD Take 237 mLs by mouth 2 (two) times daily between meals.      . pantoprazole (PROTONIX) 40 MG tablet Take 40 mg by mouth daily.      . sevelamer carbonate (RENVELA) 800 MG tablet Take 800 mg by mouth 3 (three) times daily with meals.      . sodium bicarbonate 650 MG tablet Take 650 mg by mouth 2 (two) times daily.      .  tamsulosin (FLOMAX) 0.4 MG CAPS Take 0.4 mg by mouth at bedtime.      . traMADol (ULTRAM) 50 MG tablet Take 50 mg by mouth every 6 (six) hours as needed for pain.       No current facility-administered medications on file prior to visit.    Allergies  Allergen Reactions  . Statins Other (See Comments)    Muscle weakness     Past Medical History  Diagnosis Date  . Renal disorder   . Coronary artery disease   . Diabetes mellitus without complication   . Hypertension   . Arthritis   . MI (myocardial infarction) 1992  . Carotid artery disease 09/2012    was due for CEA in March 2014.   . Gastritis 2012 and 09/2011    treated for H Pylori in 12/2011    Past Surgical History  Procedure Laterality Date  . Coronary stent placement    . Hip surgery      both hips  . Av fistula placement      Left arm    History  Smoking status  . Former Smoker  . Types: Cigarettes  Smokeless tobacco  . Not on file    History  Alcohol Use  . Yes    Comment: occasionally    No family history on file.  Reviw of Systems:  Reviewed in the HPI.  All other systems are negative.  Physical Exam: Blood pressure 120/68, pulse 63, height 5\' 6"  (1.676 m), weight 177 lb 12.8 oz (80.65 kg), SpO2 98.00%. General: Well developed, well nourished, in no acute distress.  He looks somewhat pale  Head: Normocephalic, atraumatic, sclera non-icteric, mucus membranes are moist,   Neck: Supple. Carotids are 2 + without bruits. No JVD   Lungs: Clear   Heart: RR, normal S1, S2  Abdomen: Soft, non-tender, non-distended with normal bowel sounds.  Msk:  Strength and tone are normal   Extremities: No clubbing or cyanosis. No edema.  Distal pedal pulses are 2+ and equal    Neuro: CN II - XII intact.  Alert and oriented X 3.   Psych:  Normal   ECG: 11/30/2012: Normal sinus rhythm at 64. He has nonspecific ST and T wave changes.  Assessment / Plan:

## 2012-11-30 NOTE — Assessment & Plan Note (Addendum)
Nicholas [name] seems to be making good progress. He was recently admitted to the hospital with respiratory distress due to pneumonia, volume overload related to kidney failure, severe anemia, and worsening systolic congestive heart failure. He also ruled in for myocardial infarction.  He has done very well since his discharge. He's not had any shortness breath. He's feeling quite a bit better now he is on dialysis.  I suspect that his left ventricular systolic function is better now he is on dialysis.  His ejection fraction when last measured is clearly lower than his preoperative ejection fraction. He is scheduled have a right carotid endarterectomy although I do not think that he he should go for surgery at this time. We will repeat his echocardiogram for further evaluation of his LV function.  He's on metoprolol 50 mg twice a day. We will wait and consider starting him on an ACE inhibitor depending on what his upcoming echocardiogram shows.

## 2012-11-30 NOTE — Assessment & Plan Note (Signed)
He in for a non-ST segment elevation myocardial infarction. This was in the setting of severe respiratory failure, anemia, chronic kidney disease, and volume overload. He had a Lexiscan Myoview study performed at [facility] in January, 2014 which revealed no evidence of ischemia. His left ventricular systolic function was normal with an EF of 56% at that time.  I have personally reviewed the report from [facility].  He needs to have carotid surgery. At this point I think that we need to repeat his Myoview study since he's had a hospitalization with multiple complicating factors. If his Myoview study has changed, he will need a cardiac catheterization prior to his carotid endarterectomy.

## 2012-11-30 NOTE — Patient Instructions (Addendum)
Your physician has requested that you have a lexiscan myoview.  Your physician has requested that you have an echocardiogram. Echocardiography is a painless test that uses sound waves to create images of your heart. It provides your doctor with information about the size and shape of your heart and how well your heart's chambers and valves are working. This procedure takes approximately one hour. There are no restrictions for this procedure.  Your physician recommends that you schedule a follow-up appointment in: 3 months

## 2012-12-01 ENCOUNTER — Telehealth: Payer: Self-pay | Admitting: Cardiovascular Disease

## 2012-12-01 NOTE — Telephone Encounter (Signed)
New problem     Clarification   is patient clear for surgery or not . Was told by patient brother on yesterday that surgery would be postpone.

## 2012-12-01 NOTE — Telephone Encounter (Signed)
**Note De-Identified [name]** Per [facility] request, OV notes from yesterday's OV with Dr. Elease Hashimoto faxed to [facility] at [phone] and to [facility], at Dr. Driscilla Grammes office, at [phone].

## 2012-12-02 ENCOUNTER — Ambulatory Visit (HOSPITAL_COMMUNITY): Payer: Medicare Other | Attending: Cardiology | Admitting: Radiology

## 2012-12-02 DIAGNOSIS — I2510 Atherosclerotic heart disease of native coronary artery without angina pectoris: Secondary | ICD-10-CM

## 2012-12-02 DIAGNOSIS — I509 Heart failure, unspecified: Secondary | ICD-10-CM | POA: Insufficient documentation

## 2012-12-02 DIAGNOSIS — J9600 Acute respiratory failure, unspecified whether with hypoxia or hypercapnia: Secondary | ICD-10-CM | POA: Insufficient documentation

## 2012-12-02 DIAGNOSIS — I214 Non-ST elevation (NSTEMI) myocardial infarction: Secondary | ICD-10-CM

## 2012-12-02 DIAGNOSIS — I4891 Unspecified atrial fibrillation: Secondary | ICD-10-CM | POA: Insufficient documentation

## 2012-12-02 NOTE — Progress Notes (Signed)
Echocardiogram performed.  

## 2012-12-09 ENCOUNTER — Ambulatory Visit (HOSPITAL_COMMUNITY): Payer: Medicare Other | Attending: Cardiology | Admitting: Radiology

## 2012-12-09 VITALS — BP 140/77 | HR 60 | Ht 65.0 in | Wt 172.0 lb

## 2012-12-09 DIAGNOSIS — R0789 Other chest pain: Secondary | ICD-10-CM | POA: Insufficient documentation

## 2012-12-09 DIAGNOSIS — I214 Non-ST elevation (NSTEMI) myocardial infarction: Secondary | ICD-10-CM

## 2012-12-09 DIAGNOSIS — I2510 Atherosclerotic heart disease of native coronary artery without angina pectoris: Secondary | ICD-10-CM

## 2012-12-09 DIAGNOSIS — I779 Disorder of arteries and arterioles, unspecified: Secondary | ICD-10-CM | POA: Insufficient documentation

## 2012-12-09 DIAGNOSIS — I10 Essential (primary) hypertension: Secondary | ICD-10-CM | POA: Insufficient documentation

## 2012-12-09 DIAGNOSIS — E785 Hyperlipidemia, unspecified: Secondary | ICD-10-CM | POA: Insufficient documentation

## 2012-12-09 DIAGNOSIS — Z794 Long term (current) use of insulin: Secondary | ICD-10-CM | POA: Insufficient documentation

## 2012-12-09 DIAGNOSIS — Z8249 Family history of ischemic heart disease and other diseases of the circulatory system: Secondary | ICD-10-CM | POA: Insufficient documentation

## 2012-12-09 DIAGNOSIS — Z87891 Personal history of nicotine dependence: Secondary | ICD-10-CM | POA: Insufficient documentation

## 2012-12-09 DIAGNOSIS — R079 Chest pain, unspecified: Secondary | ICD-10-CM

## 2012-12-09 DIAGNOSIS — E119 Type 2 diabetes mellitus without complications: Secondary | ICD-10-CM | POA: Insufficient documentation

## 2012-12-09 DIAGNOSIS — I480 Paroxysmal atrial fibrillation: Secondary | ICD-10-CM

## 2012-12-09 DIAGNOSIS — I255 Ischemic cardiomyopathy: Secondary | ICD-10-CM

## 2012-12-09 DIAGNOSIS — I739 Peripheral vascular disease, unspecified: Secondary | ICD-10-CM | POA: Insufficient documentation

## 2012-12-09 DIAGNOSIS — Z01810 Encounter for preprocedural cardiovascular examination: Secondary | ICD-10-CM | POA: Insufficient documentation

## 2012-12-09 DIAGNOSIS — I252 Old myocardial infarction: Secondary | ICD-10-CM | POA: Insufficient documentation

## 2012-12-09 MED ORDER — REGADENOSON 0.4 MG/5ML IV SOLN
0.4000 mg | Freq: Once | INTRAVENOUS | Status: AC
  Administered 2012-12-09: 0.4 mg via INTRAVENOUS

## 2012-12-09 MED ORDER — TECHNETIUM TC 99M SESTAMIBI GENERIC - CARDIOLITE
33.0000 | Freq: Once | INTRAVENOUS | Status: AC | PRN
  Administered 2012-12-09: 33 via INTRAVENOUS

## 2012-12-09 MED ORDER — TECHNETIUM TC 99M SESTAMIBI GENERIC - CARDIOLITE
11.0000 | Freq: Once | INTRAVENOUS | Status: AC | PRN
  Administered 2012-12-09: 11 via INTRAVENOUS

## 2012-12-09 NOTE — Progress Notes (Signed)
[facility] SITE 3 NUCLEAR MED [address] [place], [place] [address] [phone]    Cardiology Nuclear Med Study  Nicholas [name] is a 72 y.o. male     MRN : [id number]     DOB: 1940-09-27  Procedure Date: 12/09/2012  Nuclear Med Background Indication for Stress Test:  Evaluation for Ischemia, Stent Patency and Pending Surgical Clearance for (R) CEA by Dr. Wyn Quaker History:  '92 MI>Stent; 1/14 MPS:no ischemia, EF=56%; 3/14 NSTEMI secondary to GIB; 12/02/12 Echo:EF=50%; h/o PAF, ICM Cardiac Risk Factors: Carotid Disease, Family History - CAD, History of Smoking, Hypertension, IDDM Type 2, Lipids and PVD  Symptoms:  Chest Pain/ "Hurts" (last date of chest discomfort now)   Nuclear Pre-Procedure Caffeine/Decaff Intake:  None > 12 hrs NPO After: 7:00pm   Lungs:  Clear. O2 Sat: 98% on room air. IV 0.9% NS with Angio Cath:  22g  IV Site: R Forearm x 1, tolerated well IV Started by:  Irean Hong, RN  Chest Size (in):  44 Cup Size: n/a  Height: 5\' 5"  (1.651 m)  Weight:  172 lb (78.019 kg)  BMI:  Body mass index is 28.62 kg/(m^2). Tech Comments:  Toprol held x 36 hours. FBS here on arrival was 128@ 8:00 am, no insulin today, and 1/2 lantus insulin last night    Nuclear Med Study 1 or 2 day study: 1 day  Stress Test Type:  Eugenie Birks  Reading MD: Marca Ancona, MD  Order Authorizing Provider:  Kristeen Miss, MD  Resting Radionuclide: Technetium 62m Sestamibi  Resting Radionuclide Dose: 11.0 mCi   Stress Radionuclide:  Technetium 31m Sestamibi  Stress Radionuclide Dose: 33.0 mCi           Stress Protocol Rest HR: 60 Stress HR: 78  Rest BP: 140/77 Stress BP: 139/73  Exercise Time (min): n/a METS: n/a   Predicted Max HR: 151 bpm % Max HR: 51.66 bpm Rate Pressure Product: 21308   Dose of Adenosine (mg):  n/a Dose of Lexiscan: 0.4 mg  Dose of Atropine (mg): n/a Dose of Dobutamine: n/a mcg/kg/min (at max HR)  Stress Test Technologist: Smiley Houseman, CMA-N  Nuclear  Technologist:  Domenic Polite, CNMT     Rest Procedure:  Myocardial perfusion imaging was performed at rest 45 minutes following the intravenous administration of Technetium 88m Sestamibi.  Rest ECG: NSR - Normal EKG  Stress Procedure:  The patient received IV Lexiscan 0.4 mg over 15-seconds.  Technetium 16m Sestamibi injected at 30-seconds.  Quantitative spect images were obtained after a 45 minute delay.  Stress ECG: No significant change from baseline ECG  QPS Raw Data Images:  Normal; no motion artifact; normal heart/lung ratio. Stress Images:  Small, mild basal inferior perfusion defect. Rest Images:  Small, mild basal inferior perfusion defect.  Subtraction (SDS):  Fixed small, mild basal inferior perfusion defect.  Transient Ischemic Dilatation (Normal <1.22):  1.13 Lung/Heart Ratio (Normal <0.45):  0.25  Quantitative Gated Spect Images QGS EDV:  144 ml QGS ESV:  76 ml  Impression Exercise Capacity:  Lexiscan with no exercise. BP Response:  Normal blood pressure response. Clinical Symptoms:  Hot feeling ECG Impression:  No significant ST segment change suggestive of ischemia. Comparison with Prior Nuclear Study: No images to compare  Overall Impression:  Intermediate stress nuclear study.  There is a small, mild fixed basal inferior perfusion defect.  There is significant subdiaphragmatic activity that could lead to artifact but given the inferior wall motion abnormality with decreased EF, suspect  this represents prior MI.  No significant ischemia noted.  Intermediate risk because of depressed EF.   LV Ejection Fraction: 47%.  LV Wall Motion:  Inferior and septal hypokinesis.   Marca Ancona 12/09/2012    .

## 2012-12-13 ENCOUNTER — Ambulatory Visit: Payer: Medicare Other | Admitting: Nurse Practitioner

## 2012-12-14 ENCOUNTER — Telehealth: Payer: Self-pay | Admitting: *Deleted

## 2012-12-14 NOTE — Telephone Encounter (Signed)
Message copied by Burnell Blanks on Tue Dec 14, 2012 10:52 AM ------      Message from: Vesta Mixer      Created: Fri Dec 10, 2012  2:36 PM       Mr. Rudd' stress Celine Ahr is worse since the previous one at [facility].  He will need a cardiac cath.  Please set him up to see my or Lawson Fiscal or scott and arrange for left heart cath. ------

## 2012-12-14 NOTE — Telephone Encounter (Signed)
Notes Recorded by Burnell Blanks on 12/13/2012 at 9:36 AM Scheduled appointment for patient for 12/21/12. Did have appointments available sooner but patient has dialysis Monday, Wednesday, and Friday and unable to come on those days.

## 2012-12-15 ENCOUNTER — Telehealth: Payer: Self-pay | Admitting: Cardiovascular Disease

## 2012-12-15 NOTE — Telephone Encounter (Signed)
Noted.  Will forward to Jodette.

## 2012-12-15 NOTE — Telephone Encounter (Signed)
New Problem:    Called in wanting to know if his brother needed to be placed back on his anti coagulants.  Declined wanting a call back from triage nurse stating that he would probably need to speak with Dr. Harvie Bridge nurse.  Stated that he would call back tomorrow.

## 2012-12-16 NOTE — Telephone Encounter (Signed)
Will route to Dr Elease Hashimoto.

## 2012-12-16 NOTE — Telephone Encounter (Signed)
msg left, i will return the call tomorrow

## 2012-12-17 NOTE — Telephone Encounter (Signed)
See's Norma [facility]rickson Np next week for abnormal nuclear// family informed pt had GI bleed and had transfusion - was on anti coag, but was stopped, Dr Elease Hashimoto didnt want him back on it at this time, family agreed to plan till further advised.

## 2012-12-17 NOTE — Telephone Encounter (Signed)
lmtcb

## 2012-12-21 ENCOUNTER — Ambulatory Visit: Payer: Medicare Other | Admitting: Nurse Practitioner

## 2013-01-04 ENCOUNTER — Encounter: Payer: Self-pay | Admitting: Physician Assistant

## 2013-01-04 ENCOUNTER — Telehealth: Payer: Self-pay | Admitting: *Deleted

## 2013-01-04 ENCOUNTER — Ambulatory Visit (INDEPENDENT_AMBULATORY_CARE_PROVIDER_SITE_OTHER): Payer: Medicare Other | Admitting: Physician Assistant

## 2013-01-04 VITALS — BP 118/62 | HR 51 | Ht 66.0 in | Wt 175.8 lb

## 2013-01-04 DIAGNOSIS — I4891 Unspecified atrial fibrillation: Secondary | ICD-10-CM

## 2013-01-04 DIAGNOSIS — I2510 Atherosclerotic heart disease of native coronary artery without angina pectoris: Secondary | ICD-10-CM

## 2013-01-04 DIAGNOSIS — I2589 Other forms of chronic ischemic heart disease: Secondary | ICD-10-CM

## 2013-01-04 DIAGNOSIS — I779 Disorder of arteries and arterioles, unspecified: Secondary | ICD-10-CM

## 2013-01-04 DIAGNOSIS — R9439 Abnormal result of other cardiovascular function study: Secondary | ICD-10-CM

## 2013-01-04 DIAGNOSIS — I25810 Atherosclerosis of coronary artery bypass graft(s) without angina pectoris: Secondary | ICD-10-CM

## 2013-01-04 DIAGNOSIS — N186 End stage renal disease: Secondary | ICD-10-CM

## 2013-01-04 DIAGNOSIS — Z7901 Long term (current) use of anticoagulants: Secondary | ICD-10-CM

## 2013-01-04 DIAGNOSIS — I5022 Chronic systolic (congestive) heart failure: Secondary | ICD-10-CM

## 2013-01-04 LAB — HEPATIC FUNCTION PANEL
ALT: 7 U/L (ref 0–53)
AST: 18 U/L (ref 0–37)
Albumin: 3.4 g/dL — ABNORMAL LOW (ref 3.5–5.2)
Total Bilirubin: 0.4 mg/dL (ref 0.3–1.2)
Total Protein: 7.3 g/dL (ref 6.0–8.3)

## 2013-01-04 LAB — PROTIME-INR: Prothrombin Time: 12 s (ref 10.2–12.4)

## 2013-01-04 LAB — BASIC METABOLIC PANEL
BUN: 25 mg/dL — ABNORMAL HIGH (ref 6–23)
Calcium: 8.7 mg/dL (ref 8.4–10.5)
Creatinine, Ser: 4.2 mg/dL — ABNORMAL HIGH (ref 0.4–1.5)
GFR: 14.89 mL/min — CL (ref >60.00)
Glucose, Bld: 103 mg/dL — ABNORMAL HIGH (ref 70–99)
Potassium: 4.1 mEq/L (ref 3.5–5.1)

## 2013-01-04 MED ORDER — METOPROLOL SUCCINATE ER 50 MG PO TB24: ORAL_TABLET | ORAL | Status: DC

## 2013-01-04 NOTE — H&P (Signed)
History and Physical  Date:  01/04/2013   ID:  Nicholas [name], DOB [birth year], MRN [id number]  PCP:  Vassie Loll, MD  Primary Cardiologist:  Dr. Delane Ginger     History of Present Illness: Nicholas [name] is a 72 y.o. male who presents for evaluation prior to cardiac cath. He has a history of ESRD, CAD, DM2, HTN, carotid artery stenosis.  Patient had a preoperative evaluation earlier this year for an upcoming right CEA. Myoview at that time demonstrated an EF of 56% and no ischemia. However, the patient was admitted to [facility] in 10/2012 with a non-STEMI, respiratory failure secondary to volume overload, pneumonia and atrial fibrillation with RVR in the setting of severe anemia from GI bleeding (hemoglobin 6.5).  He had dark stools on admission. He was transfused with PRBCs. He was seen by gastroenterology. An EGD done in [place] in 09/2012 demonstrated gastritis. He has not had a colonoscopy. Gastroenterology recommended a colonoscopy at some point once cleared by cardiology. He would need follow up with his primary gastroenterologist, Dr. Bluford Kaufmann, in [place].  Echocardiogram 3/14 demonstrated an EF of 35-40%.  He was placed on amiodarone. He has maintained sinus rhythm since then. He is not on Coumadin due to history of significant life-threatening GI bleeding. Hopefully, he will be able to come off of amiodarone at some point in the near future.  Patient saw Dr. Elease Hashimoto in follow up 11/30/12.   It was felt that his CEA should be postponed for further cardiac workup. Repeat echo 12/02/12: Mild LVH, EF 50%, grade 1 diastolic dysfunction, mild MR, mild LAE, PASP 22.  Repeat Myoview 12/09/12: Inferior defect with inferior and septal HK (favor prior MI), EF 47%.  It has been recommended that the patient present to the office today for follow up and to have a cardiac catheterization arranged.    He denies chest pain, shortness of breath, syncope, orthopnea, PND or edema.  Labs (3/14):  K 4, Cr  5.86, Hgb 9.8  Wt Readings from Last 3 Encounters:  01/04/13 175 lb 12.8 oz (79.742 kg)  12/09/12 172 lb (78.019 kg)  11/30/12 177 lb 12.8 oz (80.65 kg)     Past Medical History  Diagnosis Date  . ESRD (end stage renal disease)     T, Th, Sat dialysis  . Coronary artery disease     a. hx of MI 1992; b.echo 12/02/12: Mild LVH, EF 50%, grade 1 diastolic dysfunction, mild MR, mild LAE, PASP 22.  c.  Myoview 12/09/12: Inferior defect with inferior and septal HK (favor prior MI), EF 47%  . Diabetes mellitus without complication   . Hypertension   . Arthritis   . Carotid artery disease 09/2012    was due for R CEA in March 2014.   . Gastritis 2012 and 09/2011    treated for H Pylori in 12/2011    Current Outpatient Prescriptions  Medication Sig Dispense Refill  . allopurinol (ZYLOPRIM) 100 MG tablet Take 100 mg by mouth daily.      [name] amiodarone (PACERONE) 200 MG tablet Take 200 mg by mouth daily. Until 10/31/12; then start taking 200mg  by mouth daily.      [name] aspirin 81 MG chewable tablet Chew 1 tablet (81 mg total) by mouth daily.      . darbepoetin (ARANESP) 60 MCG/0.3ML SOLN Inject 0.3 mLs (60 mcg total) into the vein every Friday with hemodialysis.  4.2 mL    . fish oil-omega-3 fatty acids 1000 MG capsule Take  1 g by mouth daily.      [name] gabapentin (NEURONTIN) 300 MG capsule Take 600 mg by mouth 2 (two) times daily.       . insulin aspart protamine-insulin aspart (NOVOLOG 70/30) (70-30) 100 UNIT/ML injection Inject 2-10 Units into the skin 3 (three) times daily as needed (Per sliding scale, based on sugar levels).      . insulin glargine (LANTUS) 100 UNIT/ML injection Inject 15 Units into the skin at bedtime.      . lidocaine-prilocaine (EMLA) cream Apply 1 application topically as needed.       . metoprolol succinate (TOPROL-XL) 50 MG 24 hr tablet Take 1 tablet (50 mg total) by mouth 2 (two) times daily. Take with or immediately following a meal.  30 tablet  1  . Multiple Vitamins-Minerals  (MULTIVITAMIN WITH MINERALS) tablet Take 1 tablet by mouth daily.      . multivitamin (RENA-VIT) TABS tablet Take 1 tablet by mouth Nightly.      . Nutritional Supplements (FEEDING SUPPLEMENT, NEPRO CARB STEADY,) LIQD Take 237 mLs by mouth 2 (two) times daily between meals.      . pantoprazole (PROTONIX) 40 MG tablet Take 40 mg by mouth daily.      . sevelamer carbonate (RENVELA) 800 MG tablet Take 800 mg by mouth 3 (three) times daily with meals.      . sodium bicarbonate 650 MG tablet Take 650 mg by mouth 2 (two) times daily.      . tamsulosin (FLOMAX) 0.4 MG CAPS Take 0.4 mg by mouth at bedtime.      . traMADol (ULTRAM) 50 MG tablet Take 50 mg by mouth every 6 (six) hours as needed for pain.       No current facility-administered medications for this visit.    Allergies:    Allergies  Allergen Reactions  . Statins Other (See Comments)    Muscle weakness     Social History:  The patient  reports that he has quit smoking. His smoking use included Cigarettes. He smoked 0.00 packs per day. He does not have any smokeless tobacco history on file. He reports that  drinks alcohol. He reports that he does not use illicit drugs.   ROS:  Please see the history of present illness.      All other systems reviewed and negative.   PHYSICAL EXAM: VS:  BP 118/62  Pulse 51  Ht 5\' 6"  (1.676 m)  Wt 175 lb 12.8 oz (79.742 kg)  BMI 28.39 kg/m2 Well nourished, well developed, in no acute distress HEENT: normal Neck: no JVD at 90 Endocrine: No thyromegaly Cardiac:  normal S1, S2; RRR; no murmur Lungs:  clear to auscultation bilaterally, no wheezing, rhonchi or rales Abd: soft, nontender, no hepatomegaly Ext: no edema Skin: warm and dry Neuro:  CNs 2-12 intact, no focal abnormalities noted  EKG:  Sinus bradycardia, HR 51, normal axis, QTc 484     ASSESSMENT AND PLAN:  1. CAD:  He has an abnormal myoview with evidence of prior inferior MI.  This is new since his prior nuclear study earlier this  year.  He suffered a NSTEMI in the setting of respiratory failure from volume overload and pneumonia, AFib with RVR and profound anemia from GI bleed.  Dr. Delane Ginger has reviewed his myoview and has recommended proceeding with cardiac cath.  Risks and benefits of cardiac catheterization have been discussed with the patient.  These include bleeding, infection, kidney damage, stroke, heart attack, death.  The patient understands these risks and is willing to proceed.   This will be arranged on a Tuesday as he has hemodialysis on Mon, Wed, Fri.  Will likely need a statin.  Will see what his cath shows first.  2. Chronic Systolic CHF:  EF has improved to low normal (50%) by recent echo.  Volume management per dialysis.  Continue beta blocker.  Consider ACE in the future. 3. Atrial Fibrillation:  Maintaining NSR. Continue amiodarone for now.  He is not a coumadin candidate due to recent life threatening GI bleed.  Check LFTs and TSH with pre-cath labs.  HR is low.  Decrease Toprol to 50 mg in A and 25 mg in P. 4. ESRD:  He remains on Tu, Th, Sat dialysis. 5. Carotid Stenosis:  Right CEA pending.  Await cardiac evaluation to be completed first. 6. Hx of GI Bleed:  He is f/u by GI in [place]. 7. Disposition:  Proceed with cath as noted.   Signed, Tereso Newcomer, PA-C  12:14 PM 01/04/2013

## 2013-01-04 NOTE — Progress Notes (Signed)
[address], [address] [place], [place]  [address] Phone: [phone] Fax:  [phone]  Date:  01/04/2013   ID:  Nicholas [name], DOB 05/20/1941, MRN [id number]  PCP:  Vassie Loll, MD  Primary Cardiologist:  Dr. Delane Ginger     History of Present Illness: Nicholas [name] is a 72 y.o. male who presents for evaluation prior to cardiac cath. He has a history of ESRD, CAD, DM2, HTN, carotid artery stenosis.  Patient had a preoperative evaluation earlier this year for an upcoming right CEA. Myoview at that time demonstrated an EF of 56% and no ischemia. However, the patient was admitted to [facility] in 10/2012 with a non-STEMI, respiratory failure secondary to volume overload, pneumonia and atrial fibrillation with RVR in the setting of severe anemia from GI bleeding (hemoglobin 6.5).  He had dark stools on admission. He was transfused with PRBCs. He was seen by gastroenterology. An EGD done in [place] in 09/2012 demonstrated gastritis. He has not had a colonoscopy. Gastroenterology recommended a colonoscopy at some point once cleared by cardiology. He would need follow up with his primary gastroenterologist, Dr. Bluford Kaufmann, in [place].  Echocardiogram 3/14 demonstrated an EF of 35-40%.  He was placed on amiodarone. He has maintained sinus rhythm since then. He is not on Coumadin due to history of significant life-threatening GI bleeding. Hopefully, he will be able to come off of amiodarone at some point in the near future.  Patient saw Dr. Elease Hashimoto in follow up 11/30/12.   It was felt that his CEA should be postponed for further cardiac workup. Repeat echo 12/02/12: Mild LVH, EF 50%, grade 1 diastolic dysfunction, mild MR, mild LAE, PASP 22.  Repeat Myoview 12/09/12: Inferior defect with inferior and septal HK (favor prior MI), EF 47%.  It has been recommended that the patient present to the office today for follow up and to have a cardiac catheterization arranged.    He denies chest  pain, shortness of breath, syncope, orthopnea, PND or edema.  Labs (3/14):  K 4, Cr 5.86, Hgb 9.8  Wt Readings from Last 3 Encounters:  01/04/13 175 lb 12.8 oz (79.742 kg)  12/09/12 172 lb (78.019 kg)  11/30/12 177 lb 12.8 oz (80.65 kg)     Past Medical History  Diagnosis Date  . ESRD (end stage renal disease)     T, Th, Sat dialysis  . Coronary artery disease     a. hx of MI 1992; b.echo 12/02/12: Mild LVH, EF 50%, grade 1 diastolic dysfunction, mild MR, mild LAE, PASP 22.  c.  Myoview 12/09/12: Inferior defect with inferior and septal HK (favor prior MI), EF 47%  . Diabetes mellitus without complication   . Hypertension   . Arthritis   . Carotid artery disease 09/2012    was due for R CEA in March 2014.   . Gastritis 2012 and 09/2011    treated for H Pylori in 12/2011    Current Outpatient Prescriptions  Medication Sig Dispense Refill  . allopurinol (ZYLOPRIM) 100 MG tablet Take 100 mg by mouth daily.      [name] amiodarone (PACERONE) 200 MG tablet Take 200 mg by mouth daily. Until 10/31/12; then start taking 200mg  by mouth daily.      [name] aspirin 81 MG chewable tablet Chew 1 tablet (81 mg total) by mouth daily.      . darbepoetin (ARANESP) 60 MCG/0.3ML SOLN Inject 0.3 mLs (60 mcg total) into the vein every Friday with hemodialysis.  4.2 mL    . fish oil-omega-3 fatty acids 1000 MG capsule Take 1 g by mouth daily.      [name] gabapentin (NEURONTIN) 300 MG capsule Take 600 mg by mouth 2 (two) times daily.       . insulin aspart protamine-insulin aspart (NOVOLOG 70/30) (70-30) 100 UNIT/ML injection Inject 2-10 Units into the skin 3 (three) times daily as needed (Per sliding scale, based on sugar levels).      . insulin glargine (LANTUS) 100 UNIT/ML injection Inject 15 Units into the skin at bedtime.      . lidocaine-prilocaine (EMLA) cream Apply 1 application topically as needed.       . metoprolol succinate (TOPROL-XL) 50 MG 24 hr tablet Take 1 tablet (50 mg total) by mouth 2 (two) times daily.  Take with or immediately following a meal.  30 tablet  1  . Multiple Vitamins-Minerals (MULTIVITAMIN WITH MINERALS) tablet Take 1 tablet by mouth daily.      . multivitamin (RENA-VIT) TABS tablet Take 1 tablet by mouth Nightly.      . Nutritional Supplements (FEEDING SUPPLEMENT, NEPRO CARB STEADY,) LIQD Take 237 mLs by mouth 2 (two) times daily between meals.      . pantoprazole (PROTONIX) 40 MG tablet Take 40 mg by mouth daily.      . sevelamer carbonate (RENVELA) 800 MG tablet Take 800 mg by mouth 3 (three) times daily with meals.      . sodium bicarbonate 650 MG tablet Take 650 mg by mouth 2 (two) times daily.      . tamsulosin (FLOMAX) 0.4 MG CAPS Take 0.4 mg by mouth at bedtime.      . traMADol (ULTRAM) 50 MG tablet Take 50 mg by mouth every 6 (six) hours as needed for pain.       No current facility-administered medications for this visit.    Allergies:    Allergies  Allergen Reactions  . Statins Other (See Comments)    Muscle weakness     Social History:  The patient  reports that he has quit smoking. His smoking use included Cigarettes. He smoked 0.00 packs per day. He does not have any smokeless tobacco history on file. He reports that  drinks alcohol. He reports that he does not use illicit drugs.   ROS:  Please see the history of present illness.      All other systems reviewed and negative.   PHYSICAL EXAM: VS:  BP 118/62  Pulse 51  Ht 5\' 6"  (1.676 m)  Wt 175 lb 12.8 oz (79.742 kg)  BMI 28.39 kg/m2 Well nourished, well developed, in no acute distress HEENT: normal Neck: no JVD at 90 Endocrine: No thyromegaly Cardiac:  normal S1, S2; RRR; no murmur Lungs:  clear to auscultation bilaterally, no wheezing, rhonchi or rales Abd: soft, nontender, no hepatomegaly Ext: no edema Skin: warm and dry Neuro:  CNs 2-12 intact, no focal abnormalities noted  EKG:  Sinus bradycardia, HR 51, normal axis, QTc 484     ASSESSMENT AND PLAN:  1. CAD:  He has an abnormal myoview  with evidence of prior inferior MI.  This is new since his prior nuclear study earlier this year.  He suffered a NSTEMI in the setting of respiratory failure from volume overload and pneumonia, AFib with RVR and profound anemia from GI bleed.  Dr. Delane Ginger has reviewed his myoview and has recommended proceeding with cardiac cath.  Risks and benefits of cardiac catheterization have been discussed with  the patient.  These include bleeding, infection, kidney damage, stroke, heart attack, death.  The patient understands these risks and is willing to proceed.   This will be arranged on a Tuesday as he has hemodialysis on Mon, Wed, Fri.  Will likely need a statin.  Will see what his cath shows first.  2. Chronic Systolic CHF:  EF has improved to low normal (50%) by recent echo.  Volume management per dialysis.  Continue beta blocker.  Consider ACE in the future. 3. Atrial Fibrillation:  Maintaining NSR. Continue amiodarone for now.  He is not a coumadin candidate due to recent life threatening GI bleed.  Check LFTs and TSH with pre-cath labs.  HR is low.  Decrease Toprol to 50 mg in A and 25 mg in P. 4. ESRD:  He remains on Tu, Th, Sat dialysis. 5. Carotid Stenosis:  Right CEA pending.  Await cardiac evaluation to be completed first. 6. Hx of GI Bleed:  He is f/u by GI in [place]. 7. Disposition:  Proceed with cath as noted.   Signed, Tereso Newcomer, PA-C  12:14 PM 01/04/2013

## 2013-01-04 NOTE — Telephone Encounter (Signed)
Advised patient of lab results  

## 2013-01-04 NOTE — Patient Instructions (Addendum)
**Note De-Identified Nicholas [name] Obfuscation** Your physician has requested that you have a cardiac catheterization. Cardiac catheterization is used to diagnose and/or treat various heart conditions. Doctors may recommend this procedure for a number of different reasons. The most common reason is to evaluate chest pain. Chest pain can be a symptom of coronary artery disease (CAD), and cardiac catheterization can show whether plaque is narrowing or blocking your heart's arteries. This procedure is also used to evaluate the valves, as well as measure the blood flow and oxygen levels in different parts of your heart. For further information please visit [email or web address] Please follow instruction sheet, as given.  Your physician recommends that you return for lab work in: today  Your physician has recommended you make the following change in your medication: decrease Metoprolol to 50 mg in the morning and 25 mg in the evening.  Your physician recommends that you schedule a follow-up appointment in: after cath

## 2013-01-04 NOTE — Telephone Encounter (Signed)
Message copied by Burnell Blanks on Tue Jan 04, 2013  5:52 PM ------      Message from: [place], [place] T      Created: Tue Jan 04, 2013  4:36 PM       Patient with ESRD on dialysis      TSH ok      LFTs ok      CBC pending      Tereso Newcomer, PA-C  4:36 PM 01/04/2013 ------

## 2013-01-05 ENCOUNTER — Other Ambulatory Visit: Payer: Medicare Other

## 2013-01-06 ENCOUNTER — Other Ambulatory Visit: Payer: Medicare Other

## 2013-01-06 ENCOUNTER — Ambulatory Visit: Payer: Medicare Other | Admitting: *Deleted

## 2013-01-06 DIAGNOSIS — I255 Ischemic cardiomyopathy: Secondary | ICD-10-CM

## 2013-01-06 DIAGNOSIS — I249 Acute ischemic heart disease, unspecified: Secondary | ICD-10-CM

## 2013-01-06 DIAGNOSIS — I480 Paroxysmal atrial fibrillation: Secondary | ICD-10-CM

## 2013-01-06 DIAGNOSIS — Z7901 Long term (current) use of anticoagulants: Secondary | ICD-10-CM

## 2013-01-06 LAB — CBC WITH DIFFERENTIAL/PLATELET
Eosinophils Relative: 5.7 % — ABNORMAL HIGH (ref 0.0–5.0)
HCT: 32.2 % — ABNORMAL LOW (ref 39.0–52.0)
Hemoglobin: 11.1 g/dL — ABNORMAL LOW (ref 13.0–17.0)
Lymphs Abs: 1.5 10*3/uL (ref 0.7–4.0)
MCV: 92.3 fl (ref 78.0–100.0)
Monocytes Absolute: 0.8 10*3/uL (ref 0.1–1.0)
Neutro Abs: 5.5 10*3/uL (ref 1.4–7.7)
Platelets: 255 10*3/uL (ref 150.0–400.0)
WBC: 8.4 10*3/uL (ref 4.5–10.5)

## 2013-01-06 NOTE — H&P (Signed)
Will proceed with plans for cath   Alvia Grove., MD, [facility] 01/06/2013, 7:14 PM Office - [phone] Pager [phone]

## 2013-01-11 ENCOUNTER — Inpatient Hospital Stay (HOSPITAL_BASED_OUTPATIENT_CLINIC_OR_DEPARTMENT_OTHER)
Admission: RE | Admit: 2013-01-11 | Discharge: 2013-01-11 | Disposition: A | Discharge status: Home / Self Care | Payer: Medicare Other | Source: Ambulatory Visit | Attending: Cardiovascular Disease | Admitting: Cardiovascular Disease

## 2013-01-11 ENCOUNTER — Telehealth: Payer: Self-pay | Admitting: *Deleted

## 2013-01-11 ENCOUNTER — Encounter (HOSPITAL_BASED_OUTPATIENT_CLINIC_OR_DEPARTMENT_OTHER)
Admission: RE | Disposition: A | Discharge status: Home / Self Care | Payer: Self-pay | Source: Ambulatory Visit | Attending: Cardiovascular Disease

## 2013-01-11 DIAGNOSIS — N186 End stage renal disease: Secondary | ICD-10-CM | POA: Insufficient documentation

## 2013-01-11 DIAGNOSIS — I2510 Atherosclerotic heart disease of native coronary artery without angina pectoris: Secondary | ICD-10-CM

## 2013-01-11 DIAGNOSIS — I252 Old myocardial infarction: Secondary | ICD-10-CM | POA: Insufficient documentation

## 2013-01-11 DIAGNOSIS — I509 Heart failure, unspecified: Secondary | ICD-10-CM | POA: Insufficient documentation

## 2013-01-11 DIAGNOSIS — R9439 Abnormal result of other cardiovascular function study: Secondary | ICD-10-CM | POA: Insufficient documentation

## 2013-01-11 DIAGNOSIS — I4891 Unspecified atrial fibrillation: Secondary | ICD-10-CM | POA: Insufficient documentation

## 2013-01-11 DIAGNOSIS — E119 Type 2 diabetes mellitus without complications: Secondary | ICD-10-CM | POA: Insufficient documentation

## 2013-01-11 DIAGNOSIS — I120 Hypertensive chronic kidney disease with stage 5 chronic kidney disease or end stage renal disease: Secondary | ICD-10-CM | POA: Insufficient documentation

## 2013-01-11 DIAGNOSIS — Z992 Dependence on renal dialysis: Secondary | ICD-10-CM | POA: Insufficient documentation

## 2013-01-11 DIAGNOSIS — I2589 Other forms of chronic ischemic heart disease: Secondary | ICD-10-CM | POA: Insufficient documentation

## 2013-01-11 DIAGNOSIS — I5022 Chronic systolic (congestive) heart failure: Secondary | ICD-10-CM | POA: Insufficient documentation

## 2013-01-11 SURGERY — JV LEFT HEART CATHETERIZATION WITH CORONARY ANGIOGRAM
Anesthesia: Moderate Sedation

## 2013-01-11 MED ORDER — ACETAMINOPHEN 325 MG PO TABS: 650.0000 mg | ORAL_TABLET | ORAL | Status: DC | PRN

## 2013-01-11 MED ORDER — ONDANSETRON HCL 4 MG/2ML IJ SOLN: 4.0000 mg | Freq: Four times a day (QID) | INTRAMUSCULAR | Status: DC | PRN

## 2013-01-11 MED ORDER — SODIUM CHLORIDE 0.9 % IV SOLN
INTRAVENOUS | Status: DC
  Administered 2013-01-11: 09:00:00 via INTRAVENOUS

## 2013-01-11 MED ORDER — SODIUM CHLORIDE 0.9 % IV SOLN: 250.0000 mL | INTRAVENOUS | Status: DC | PRN

## 2013-01-11 MED ORDER — SODIUM CHLORIDE 0.9 % IJ SOLN: 3.0000 mL | Freq: Two times a day (BID) | INTRAMUSCULAR | Status: DC

## 2013-01-11 MED ORDER — SODIUM CHLORIDE 0.9 % IJ SOLN: 3.0000 mL | INTRAMUSCULAR | Status: DC | PRN

## 2013-01-11 MED ORDER — ASPIRIN 81 MG PO CHEW
324.0000 mg | CHEWABLE_TABLET | ORAL | Status: AC
  Administered 2013-01-11: 324 mg via ORAL

## 2013-01-11 NOTE — H&P (View-Only) (Signed)
[address] [place], [place]  [address] Phone: [phone] Fax:  [phone]  Date:  01/04/2013   ID:  Nicholas [name], DOB 05/27/1941, MRN [id number]  PCP:  MADERA,CARLOS, MD  Primary Cardiologist:  Dr. Phil Nahser     History of Present Illness: Nicholas [name] is a 72 y.o. male who presents for evaluation prior to cardiac cath. He has a history of ESRD, CAD, DM2, HTN, carotid artery stenosis.  Patient had a preoperative evaluation earlier this year for an upcoming right CEA. Myoview at that time demonstrated an EF of 56% and no ischemia. However, the patient was admitted to [facility] in 10/2012 with a non-STEMI, respiratory failure secondary to volume overload, pneumonia and atrial fibrillation with RVR in the setting of severe anemia from GI bleeding (hemoglobin 6.5).  He had dark stools on admission. He was transfused with PRBCs. He was seen by gastroenterology. An EGD done in [place] in 09/2012 demonstrated gastritis. He has not had a colonoscopy. Gastroenterology recommended a colonoscopy at some point once cleared by cardiology. He would need follow up with his primary gastroenterologist, Dr. Oh, in [place].  Echocardiogram 3/14 demonstrated an EF of 35-40%.  He was placed on amiodarone. He has maintained sinus rhythm since then. He is not on Coumadin due to history of significant life-threatening GI bleeding. Hopefully, he will be able to come off of amiodarone at some point in the near future.  Patient saw Dr. Nahser in follow up 11/30/12.   It was felt that his CEA should be postponed for further cardiac workup. Repeat echo 12/02/12: Mild LVH, EF 50%, grade 1 diastolic dysfunction, mild MR, mild LAE, PASP 22.  Repeat Myoview 12/09/12: Inferior defect with inferior and septal HK (favor prior MI), EF 47%.  It has been recommended that the patient present to the office today for follow up and to have a cardiac catheterization arranged.    He denies chest  pain, shortness of breath, syncope, orthopnea, PND or edema.  Labs (3/14):  K 4, Cr 5.86, Hgb 9.8  Wt Readings from Last 3 Encounters:  01/04/13 175 lb 12.8 oz (79.742 kg)  12/09/12 172 lb (78.019 kg)  11/30/12 177 lb 12.8 oz (80.65 kg)     Past Medical History  Diagnosis Date  . ESRD (end stage renal disease)     T, Th, Sat dialysis  . Coronary artery disease     a. hx of MI 1992; b.echo 12/02/12: Mild LVH, EF 50%, grade 1 diastolic dysfunction, mild MR, mild LAE, PASP 22.  c.  Myoview 12/09/12: Inferior defect with inferior and septal HK (favor prior MI), EF 47%  . Diabetes mellitus without complication   . Hypertension   . Arthritis   . Carotid artery disease 09/2012    was due for R CEA in March 2014.   . Gastritis 2012 and 09/2011    treated for H Pylori in 12/2011    Current Outpatient Prescriptions  Medication Sig Dispense Refill  . allopurinol (ZYLOPRIM) 100 MG tablet Take 100 mg by mouth daily.      . amiodarone (PACERONE) 200 MG tablet Take 200 mg by mouth daily. Until 10/31/12; then start taking 200mg by mouth daily.      . aspirin 81 MG chewable tablet Chew 1 tablet (81 mg total) by mouth daily.      . darbepoetin (ARANESP) 60 MCG/0.3ML SOLN Inject 0.3 mLs (60 mcg total) into the vein every Friday with hemodialysis.    4.2 mL    . fish oil-omega-3 fatty acids 1000 MG capsule Take 1 g by mouth daily.      . gabapentin (NEURONTIN) 300 MG capsule Take 600 mg by mouth 2 (two) times daily.       . insulin aspart protamine-insulin aspart (NOVOLOG 70/30) (70-30) 100 UNIT/ML injection Inject 2-10 Units into the skin 3 (three) times daily as needed (Per sliding scale, based on sugar levels).      . insulin glargine (LANTUS) 100 UNIT/ML injection Inject 15 Units into the skin at bedtime.      . lidocaine-prilocaine (EMLA) cream Apply 1 application topically as needed.       . metoprolol succinate (TOPROL-XL) 50 MG 24 hr tablet Take 1 tablet (50 mg total) by mouth 2 (two) times daily.  Take with or immediately following a meal.  30 tablet  1  . Multiple Vitamins-Minerals (MULTIVITAMIN WITH MINERALS) tablet Take 1 tablet by mouth daily.      . multivitamin (RENA-VIT) TABS tablet Take 1 tablet by mouth Nightly.      . Nutritional Supplements (FEEDING SUPPLEMENT, NEPRO CARB STEADY,) LIQD Take 237 mLs by mouth 2 (two) times daily between meals.      . pantoprazole (PROTONIX) 40 MG tablet Take 40 mg by mouth daily.      . sevelamer carbonate (RENVELA) 800 MG tablet Take 800 mg by mouth 3 (three) times daily with meals.      . sodium bicarbonate 650 MG tablet Take 650 mg by mouth 2 (two) times daily.      . tamsulosin (FLOMAX) 0.4 MG CAPS Take 0.4 mg by mouth at bedtime.      . traMADol (ULTRAM) 50 MG tablet Take 50 mg by mouth every 6 (six) hours as needed for pain.       No current facility-administered medications for this visit.    Allergies:    Allergies  Allergen Reactions  . Statins Other (See Comments)    Muscle weakness     Social History:  The patient  reports that he has quit smoking. His smoking use included Cigarettes. He smoked 0.00 packs per day. He does not have any smokeless tobacco history on file. He reports that  drinks alcohol. He reports that he does not use illicit drugs.   ROS:  Please see the history of present illness.      All other systems reviewed and negative.   PHYSICAL EXAM: VS:  BP 118/62  Pulse 51  Ht 5' 6" (1.676 m)  Wt 175 lb 12.8 oz (79.742 kg)  BMI 28.39 kg/m2 Well nourished, well developed, in no acute distress HEENT: normal Neck: no JVD at 90 Endocrine: No thyromegaly Cardiac:  normal S1, S2; RRR; no murmur Lungs:  clear to auscultation bilaterally, no wheezing, rhonchi or rales Abd: soft, nontender, no hepatomegaly Ext: no edema Skin: warm and dry Neuro:  CNs 2-12 intact, no focal abnormalities noted  EKG:  Sinus bradycardia, HR 51, normal axis, QTc 484     ASSESSMENT AND PLAN:  1. CAD:  He has an abnormal myoview  with evidence of prior inferior MI.  This is new since his prior nuclear study earlier this year.  He suffered a NSTEMI in the setting of respiratory failure from volume overload and pneumonia, AFib with RVR and profound anemia from GI bleed.  Dr. Phil Nahser has reviewed his myoview and has recommended proceeding with cardiac cath.  Risks and benefits of cardiac catheterization have been discussed with   the patient.  These include bleeding, infection, kidney damage, stroke, heart attack, death.  The patient understands these risks and is willing to proceed.   This will be arranged on a Tuesday as he has hemodialysis on Mon, Wed, Fri.  Will likely need a statin.  Will see what his cath shows first.  2. Chronic Systolic CHF:  EF has improved to low normal (50%) by recent echo.  Volume management per dialysis.  Continue beta blocker.  Consider ACE in the future. 3. Atrial Fibrillation:  Maintaining NSR. Continue amiodarone for now.  He is not a coumadin candidate due to recent life threatening GI bleed.  Check LFTs and TSH with pre-cath labs.  HR is low.  Decrease Toprol to 50 mg in A and 25 mg in P. 4. ESRD:  He remains on Tu, Th, Sat dialysis. 5. Carotid Stenosis:  Right CEA pending.  Await cardiac evaluation to be completed first. 6. Hx of GI Bleed:  He is f/u by GI in [place]. 7. Disposition:  Proceed with cath as noted.   Signed, Nemesio Castrillon, PA-C  12:14 PM 01/04/2013    

## 2013-01-11 NOTE — CV Procedure (Signed)
Cardiac Catheterization Operative Report  Nicholas [name] [id number] 5/27/201410:30 AM Vassie Loll, MD  Primary Cardiologist: Delane Ginger, MD   Procedure Performed:  1. Left Heart Catheterization 2. Selective Coronary Angiography 3. Left ventricular angiogram  Operator: Verne Carrow, MD  Indication:   72 yo male with history of ESRD on HD, CAD with last cath in 1992 with reported angioplasty at that time, DM2, HTN, carotid artery stenosis. Patient had a preoperative evaluation earlier this year for an upcoming right CEA. Myoview at that time demonstrated an EF of 56% and no ischemia. However, the patient was admitted to [facility] March 2014 with a non-STEMI, respiratory failure secondary to volume overload, pneumonia and atrial fibrillation with RVR in the setting of severe anemia from GI bleeding (hemoglobin 6.5). He had dark stools on admission. He was transfused with PRBCs. He was seen by gastroenterology. An EGD done in [place] in 09/2012 demonstrated gastritis. He has not had a colonoscopy. Gastroenterology recommended a colonoscopy at some point once cleared by cardiology. Echocardiogram 3/14 demonstrated an EF of 35-40%. He was placed on amiodarone. He has maintained sinus rhythm since then. He is not on Coumadin due to history of significant life-threatening GI bleeding. Patient saw Dr. Elease Hashimoto in follow up 11/30/12. It was felt that his CEA should be postponed for further cardiac workup. Repeat echo 12/02/12: Mild LVH, EF 50%, grade 1 diastolic dysfunction, mild MR, mild LAE, PASP 22. Repeat Myoview 12/09/12: Inferior defect with inferior and septal HK (favor prior MI), EF 47%. It has been recommended that the patient have a cardiac cath today to exclude progression of CAD.                              Procedure Details: The risks, benefits, complications, treatment options, and expected outcomes were discussed with the patient. The patient and/or family concurred  with the proposed plan, giving informed consent. The patient was brought to the cath lab after IV hydration was begun and oral premedication was given. The patient was further sedated with Versed and Fentanyl. The right groin was prepped and draped in the usual manner. Using the modified Seldinger access technique, a 4 French sheath was placed in the right femoral artery. Standard diagnostic catheters were used to perform selective coronary angiography. A pigtail catheter was used to perform a left ventricular angiogram.  There were no immediate complications. The patient was taken to the recovery area in stable condition.   Hemodynamic Findings: Central aortic pressure: 161/72 Left ventricular pressure: 155/8/13  Angiographic Findings:  Left main: No obstructive disease.   Left Anterior Descending Artery: Moderate to large caliber vessel that courses to the apex. The proximal vessel has mild disease. The mid vessel has diffuse calcific 70% stenosis followed by tandem 95% stenoses. The distal vessel has a discreet 80% stenosis. The diagonal branch is moderate in caliber with a 99% proximal stenosis.    Circumflex Artery: Moderate caliber vessel with 60-70% mid stenosis just before the takeoff of two marginal branches. The first obtuse marginal branch is small to moderate in caliber with proximal 99% stenosis. The second obtuse marginal branch has mild plaque. The continuation of the AV groove Circumflex is small in caliber with mild plaque disease.   Right Coronary Artery: Large caliber, dominant vessel with 40% proximal stenosis, 99% mid stenosis followed by 100% mid occlusion. There is faint filling of the distal vessel via antegrade injection. The PDA and PLA is  also seen to fill by left to right collaterals.   Left Ventricular Angiogram: LVEF= 45-50%.  Impression: 1. Severe triple vessel CAD 2. Ischemic cardiomyopathy  Recommendations: He has diffuse 3 vessel CAD. Will need referral to CT  surgery for possible CABG. Will discuss with Dr. Elease Hashimoto in regards to timing.        Complications:  None. The patient tolerated the procedure well.

## 2013-01-11 NOTE — Progress Notes (Signed)
Bedrest begins @ 1050, tegaderm dressing applied to right groin site by Nicholas [name], site level 0.

## 2013-01-11 NOTE — Interval H&P Note (Signed)
History and Physical Interval Note:  01/11/2013 9:42 AM  Nicholas [name]  has presented today for cardiac cath with the diagnosis of CAD, abnormal stress test.  The various methods of treatment have been discussed with the patient and family. After consideration of risks, benefits and other options for treatment, the patient has consented to  Procedure(s): JV LEFT HEART CATHETERIZATION WITH CORONARY ANGIOGRAM (N/A) as a surgical intervention .  The patient's history has been reviewed, patient examined, no change in status, stable for surgery.  I have reviewed the patient's chart and labs.  Questions were answered to the patient's satisfaction.     Nicholas [name]

## 2013-01-11 NOTE — Telephone Encounter (Signed)
Call received from Cath lab, orders followed from Dr Clifton Kollin. Pt needs referal to cardiothoracic Vascular surgeons. Pt needs Tuesday app due to his drivers schedule and dialysis schedule. Please call brother/ Thereasa Distance with information for date and time. [phone]. Order set to [facility].

## 2013-01-12 ENCOUNTER — Telehealth: Payer: Self-pay | Admitting: *Deleted

## 2013-01-12 NOTE — Telephone Encounter (Signed)
Message copied by Antony Odea on Wed Jan 12, 2013 10:40 AM ------      Message from: Mariane Masters D      Created: Wed Jan 12, 2013 10:23 AM       01/12/13 All paper faxed today,      ----- Message -----         From: Antony Odea, RN         Sent: 01/11/2013   3:55 PM           To: Antony Odea, RN, [facility]             Please call brother/ Thereasa Distance with information for date and time. [phone]. Needs a Tuesday per brother... fyi      Pt needs referral to cardiothoracic surgeons  for severe triple vessel disease.        ------

## 2013-01-12 NOTE — Telephone Encounter (Signed)
Message copied by Antony Odea on Wed Jan 12, 2013  5:05 PM ------      Message from: Mariane Masters D      Created: Wed Jan 12, 2013 11:09 AM       01/12/13 Appointment with Dr.Hendrickson on 01/18/13,patients brother is aware. ------

## 2013-01-12 NOTE — Telephone Encounter (Signed)
Noted  

## 2013-01-12 NOTE — Telephone Encounter (Signed)
noted 

## 2013-01-14 ENCOUNTER — Other Ambulatory Visit: Payer: Self-pay | Admitting: *Deleted

## 2013-01-18 ENCOUNTER — Institutional Professional Consult (permissible substitution) (INDEPENDENT_AMBULATORY_CARE_PROVIDER_SITE_OTHER): Payer: Medicare Other | Admitting: Thoracic Surgery (Cardiothoracic Vascular Surgery)

## 2013-01-18 ENCOUNTER — Other Ambulatory Visit: Payer: Self-pay | Admitting: *Deleted

## 2013-01-18 ENCOUNTER — Encounter: Payer: Self-pay | Admitting: Thoracic Surgery (Cardiothoracic Vascular Surgery)

## 2013-01-18 VITALS — BP 114/71 | HR 66 | Resp 20 | Ht 66.0 in | Wt 175.0 lb

## 2013-01-18 DIAGNOSIS — I779 Disorder of arteries and arterioles, unspecified: Secondary | ICD-10-CM

## 2013-01-18 DIAGNOSIS — I509 Heart failure, unspecified: Secondary | ICD-10-CM

## 2013-01-18 DIAGNOSIS — I4891 Unspecified atrial fibrillation: Secondary | ICD-10-CM

## 2013-01-18 DIAGNOSIS — N185 Chronic kidney disease, stage 5: Secondary | ICD-10-CM

## 2013-01-18 DIAGNOSIS — I5022 Chronic systolic (congestive) heart failure: Secondary | ICD-10-CM

## 2013-01-18 DIAGNOSIS — I6529 Occlusion and stenosis of unspecified carotid artery: Secondary | ICD-10-CM

## 2013-01-18 DIAGNOSIS — I2510 Atherosclerotic heart disease of native coronary artery without angina pectoris: Secondary | ICD-10-CM

## 2013-01-18 NOTE — Progress Notes (Signed)
PCP is Daniel Nones Referring Provider is Nahser, Deloris Ping, MD  Chief Complaint  [name] presents with  . Coronary Artery Disease    surgical eval on 3V CAD, Cardiac Cath 01/11/13    HPI: 72 year old [name] sent for evaluation for coronary bypass grafting.  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 Earlier this year he was being evaluated for a right carotid endarterectomy for asymptomatic carotid stenosis (80-85% per [name] report). He had a [employer] which showed an ejection fraction of 56% and no signs of ischemia. However before he could have Nicholas surgery done he was admitted to in March with bilateral pneumonia and respiratory failure. She also was in atrial fibrillation with a rapid ventricular response and had severe anemia from gastrointestinal bleeding. During Nicholas hospitalization he ruled in for a non-ST elevation MI.  An echocardiogram in March showed an EF of 35-40% with mitral regurgitation. A repeat echo in April showed an EF of 50% with grade 1 diastolic dysfunction, only mild mitral regurgitation. A repeat Myoview on 12/09/2012 showed an inferior defect consistent with prior MI an EF of 47%. He underwent cardiac catheterization on 5/27 which demonstrated severe three-vessel coronary disease. EF was 45-50%.  He has not had any chest pain, tightness, or pressure. He does not have any significant shortness of breath with exertion, however his physical activity is very limited. 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 He walks with a cane. In general he is very sedentary. He has not had any amaurosis, speech impairment, or unilateral neurologic signs.  Past Medical History  Diagnosis Date  . ESRD (end stage renal disease)      T, Th, Sat dialysis  . Coronary artery disease     a. hx of MI 1992; b.echo 12/02/12: Mild LVH, EF 50%, grade 1 diastolic dysfunction, mild MR, mild LAE, PASP 22.  c.  Myoview 12/09/12: Inferior defect with inferior and septal HK (favor prior MI), EF 47%  . Diabetes mellitus   . Hypertension   . Arthritis   . Carotid artery disease 09/2012    was due for R CEA in March 2014.   . Gastritis 2012 and 09/2011    treated for H Pylori in 12/2011  . Diabetic neuropathy   . Diabetic nephropathy   . Osteoporosis     Past Surgical History  Procedure Laterality Date  . Coronary stent placement    . Hip surgery      both hips  . Av fistula placement      Left arm    Family History  Problem Relation Age of Onset  . Heart attack Father 62  . Stroke Mother   . Stroke Maternal Grandmother     Social History History  Substance Use Topics  . Smoking status: Former Smoker -- 1.00 packs/day for 25 years    Types: Cigarettes    Start date: 08/18/1965    Quit date: 08/18/1990  . Smokeless tobacco: Not on file  . Alcohol Use: Yes     Comment: occasionally    Current Outpatient Prescriptions  Medication Sig Dispense Refill  . allopurinol (ZYLOPRIM) 100 MG tablet Take 100 mg by mouth daily.      [name] amiodarone (PACERONE) 200 MG tablet Take 200 mg by mouth daily. Until 10/31/12; then start taking 200mg  by mouth daily.      [name]  aspirin 81 MG chewable tablet Chew 1 tablet (81 mg total) by mouth daily.      . darbepoetin (ARANESP) 60 MCG/0.3ML SOLN Inject 0.3 mLs (60 mcg total) into Nicholas vein every Friday with hemodialysis.  4.2 mL    . fish oil-omega-3 fatty acids 1000 MG capsule Take 1 g by mouth daily.      [name] gabapentin (NEURONTIN) 300 MG capsule Take 600 mg by mouth 2 (two) times daily.       . insulin aspart protamine-insulin aspart (NOVOLOG 70/30) (70-30) 100 UNIT/ML injection Inject 2-10 Units into Nicholas skin 3 (three) times daily as needed (Per sliding scale, based on sugar levels).      .  insulin glargine (LANTUS) 100 UNIT/ML injection Inject 15 Units into Nicholas skin at bedtime.      . lidocaine-prilocaine (EMLA) cream Apply 1 application topically as needed.       . metoprolol succinate (TOPROL-XL) 50 MG 24 hr tablet Take 50 mg in Nicholas am and 25 mg in Nicholas pm. Take with or immediately following a meal.  45 tablet  3  . Multiple Vitamins-Minerals (MULTIVITAMIN WITH MINERALS) tablet Take 1 tablet by mouth daily.      . multivitamin (RENA-VIT) TABS tablet Take 1 tablet by mouth Nightly.      . Nutritional Supplements (FEEDING SUPPLEMENT, NEPRO CARB STEADY,) LIQD Take 237 mLs by mouth 2 (two) times daily between meals.      . pantoprazole (PROTONIX) 40 MG tablet Take 40 mg by mouth daily.      . sevelamer carbonate (RENVELA) 800 MG tablet Take 800 mg by mouth 3 (three) times daily with meals.      . tamsulosin (FLOMAX) 0.4 MG CAPS Take 0.4 mg by mouth at bedtime.      . traMADol (ULTRAM) 50 MG tablet Take 50 mg by mouth every 6 (six) hours as needed for pain.       No current facility-administered medications for this visit.    Allergies  Allergen Reactions  . Statins Other (See Comments)    Muscle weakness     Review of Systems  Constitutional: Positive for activity change (decreased energy).  Eyes: Negative.   Gastrointestinal: Positive for diarrhea and constipation.  Genitourinary:       Renal failure/ dialysis MWF  Musculoskeletal: Positive for arthralgias.       Bilateral hip replacements  Skin:       itching  Neurological:       Neuropathy in feet/ legs  Hematological: Bruises/bleeds easily.  All other systems reviewed and are negative.    BP 114/71  Pulse 66  Resp 20  Ht 5\' 6"  (1.676 m)  Wt 175 lb (79.379 kg)  BMI 28.26 kg/m2  SpO2 98% Physical Exam  Vitals reviewed. Constitutional: He is oriented to person, place, and time. No distress.  HENT:  Head: Normocephalic and atraumatic.  Eyes: EOM are normal. Pupils are equal, round, and reactive to light.   Neck: Neck supple. No thyromegaly present.  No audible bruits  Cardiovascular: Normal rate, regular rhythm and normal heart sounds.  Exam reveals no gallop and no friction rub.   No murmur heard. Unable to palpate DP/PT  Pulmonary/Chest: Effort normal and breath sounds normal. He has no wheezes. He has no rales.  Abdominal: Soft. There is no tenderness.  Musculoskeletal: Normal range of motion. He exhibits edema (1+).  Lymphadenopathy:    He has no cervical adenopathy.  Neurological: He is alert and oriented to person,  place, and time. No cranial nerve deficit.  Skin: Skin is warm and dry.     Diagnostic Tests:  ECHOCARDIOGRAM 12/02/12  Study Conclusions  - Left ventricle: Nicholas cavity size was normal. Wall thickness was increased in a pattern of mild LVH. Nicholas estimated ejection fraction was 50%. Mild global hypokinesis. Doppler parameters are consistent with abnormal left ventricular relaxation (grade 1 diastolic dysfunction). - Aortic valve: There was no stenosis. - Mitral valve: Mild regurgitation. - Left atrium: Nicholas atrium was mildly dilated. - Right ventricle: Nicholas cavity size was normal. Systolic function was normal. - Tricuspid valve: Peak RV-RA gradient: 17mm Hg (S). - Pulmonary arteries: PA peak pressure: 22mm Hg (S). - Inferior vena cava: Nicholas vessel was normal in size; Nicholas respirophasic diameter changes were in Nicholas normal range (= 50%); findings are consistent with normal central venous pressure. Impressions:  - Normal LV size with mild LV hypertrophy. EF 50% with mild global hypokinesis. Normal RV size and systolic function. Mild MR.     CARDIAC CATHETERIZATION 01/11/13  Hemodynamic Findings:  Central aortic pressure: 161/72  Left ventricular pressure: 155/8/13  Angiographic Findings:  Left main: No obstructive disease.  Left Anterior Descending Artery: Moderate to large caliber vessel that courses to Nicholas apex. Nicholas proximal vessel has mild disease. Nicholas mid  vessel has diffuse calcific 70% stenosis followed by tandem 95% stenoses. Nicholas distal vessel has a discreet 80% stenosis. Nicholas diagonal branch is moderate in caliber with a 99% proximal stenosis.  Circumflex Artery: Moderate caliber vessel with 60-70% mid stenosis just before Nicholas takeoff of two marginal branches. Nicholas first obtuse marginal branch is small to moderate in caliber with proximal 99% stenosis. Nicholas second obtuse marginal branch has mild plaque. Nicholas continuation of Nicholas AV groove Circumflex is small in caliber with mild plaque disease.  Right Coronary Artery: Large caliber, dominant vessel with 40% proximal stenosis, 99% mid stenosis followed by 100% mid occlusion. There is faint filling of Nicholas distal vessel via antegrade injection. Nicholas PDA and PLA is also seen to fill by left to right collaterals.  Left Ventricular Angiogram: LVEF= 45-50%.  Impression:  1. Severe triple vessel CAD  2. Ischemic cardiomyopathy  Recommendations: He has diffuse 3 vessel CAD. Will need referral to CT surgery for possible CABG. Will discuss with Dr. Elease Hashimoto in regards to timing.   Impression: 73 year old [name] with multiple major medical problems including complicated diabetes with neuropathy and nephropathy. He also has end-stage renal disease and is on hemodialysis. He was hospitalized a few months ago with bilateral pneumonia and respiratory failure. Around that time he also has a severe GI bleed and severe anemia with a hemoglobin of 6.5. He had atrial fibrillation during that admission his been in sinus rhythm since then. He is on amiodarone. He has not on Coumadin due to his GI bleed history. He is asymptomatic from a cardiac standpoint. However cardiac catheterization does reveal severe three-vessel coronary disease and he would benefit from coronary bypass grafting for survival benefit.  Nicholas primary issue in this case is timing of coronary bypass grafting and right carotid endarterectomy. According to Nicholas  [name] and his brother is been over 6 months since he had his carotid duplex. Her first up is to repeat Nicholas carotid duplex to see just how severe his carotid disease is. If it is in fact an 80-85% range then I would recommend coronary bypass grafting first. If it is significantly tighter than that we will have to consider having his carotid endarterectomy done  first or possibly a combined procedure.  I had a long discussion with Nicholas [name] and his brother regarding coronary bypass grafting. They understand this is for survival benefit. He's not having any anginal symptoms so I don't think there is any symptomatic benefit to be gained. We discussed Nicholas general nature of Nicholas procedure, use of general anesthesia, incisions to be used, expected hospital stay, and overall recovery. He will likely need SNF placement postoperatively due to his multiple issues and limited support.  I discussed in detail with them Nicholas indications, risks, benefits, and alternatives. They understand Nicholas risk include but are not limited to death, stroke, MI, DVT, PE, bleeding, possible need for transfusion, infection, cardiac arrhythmias including atrial fibrillation, respiratory failure, gastrointestinal complications, and Nicholas possibility of unforeseeable complications. They do understand that he is a high risk [name] given his multiple medical problems and dialysis dependence. He does wish to proceed with surgery  Plan: Obtain repeat carotid duplex  Once duplex results are known we can formulate a plan

## 2013-01-20 ENCOUNTER — Ambulatory Visit (HOSPITAL_COMMUNITY)
Admission: RE | Admit: 2013-01-20 | Discharge: 2013-01-20 | Disposition: A | Discharge status: Home / Self Care | Payer: Medicare Other | Source: Ambulatory Visit | Attending: Thoracic Surgery (Cardiothoracic Vascular Surgery) | Admitting: Thoracic Surgery (Cardiothoracic Vascular Surgery)

## 2013-01-20 ENCOUNTER — Other Ambulatory Visit (HOSPITAL_COMMUNITY): Payer: Self-pay | Admitting: Thoracic Surgery (Cardiothoracic Vascular Surgery)

## 2013-01-20 DIAGNOSIS — I6529 Occlusion and stenosis of unspecified carotid artery: Secondary | ICD-10-CM

## 2013-01-20 DIAGNOSIS — Z01810 Encounter for preprocedural cardiovascular examination: Secondary | ICD-10-CM

## 2013-01-20 NOTE — Progress Notes (Signed)
*  PRELIMINARY RESULTS* Vascular Ultrasound Carotid Duplex (Doppler) has been completed.  Preliminary findings:  Borderline greater than 80% stenosis of the proximal right ICA. 0-39% stenosis of the left ICA. Antegrade vertebral flow.  Farrel Demark, RDMS, RVT  01/20/2013, 2:55 PM

## 2013-01-24 ENCOUNTER — Other Ambulatory Visit: Payer: Self-pay | Admitting: *Deleted

## 2013-01-24 ENCOUNTER — Encounter (HOSPITAL_COMMUNITY): Payer: Self-pay | Admitting: Pharmacy Technician

## 2013-01-24 DIAGNOSIS — I2510 Atherosclerotic heart disease of native coronary artery without angina pectoris: Secondary | ICD-10-CM

## 2013-01-26 ENCOUNTER — Other Ambulatory Visit: Payer: Self-pay | Admitting: *Deleted

## 2013-01-26 ENCOUNTER — Encounter: Payer: Self-pay | Admitting: Vascular Surgery

## 2013-01-27 ENCOUNTER — Encounter: Payer: Medicare Other | Admitting: Vascular Surgery

## 2013-01-27 ENCOUNTER — Ambulatory Visit (INDEPENDENT_AMBULATORY_CARE_PROVIDER_SITE_OTHER): Payer: Medicare Other | Admitting: Vascular Surgery

## 2013-01-27 ENCOUNTER — Encounter: Payer: Self-pay | Admitting: Vascular Surgery

## 2013-01-27 ENCOUNTER — Other Ambulatory Visit: Payer: Medicare Other

## 2013-01-27 ENCOUNTER — Other Ambulatory Visit: Payer: Self-pay | Admitting: *Deleted

## 2013-01-27 DIAGNOSIS — I6529 Occlusion and stenosis of unspecified carotid artery: Secondary | ICD-10-CM

## 2013-01-27 NOTE — Progress Notes (Signed)
[facility] [place]  New Carotid Patient  Referred by:  Loreli Slot, MD [address] [place], [place] [address]  Reason for referral: right carotid stenosis  History of Present Illness  Nicholas [name] is a 72 y.o. (12/26/1940) male who presents with chief complaint: CAD and  right  asymptomaticcarotid stenosis.  He was scheduled to have right carotid endarterectomy in [place] by Dr. Kristeen Miss when the discovered he had significant CAD and needs CABG.  His CABG is scheduled for 02-03-2013.  Previous carotid studies demonstrated: RICA >80% stenosis, LICA <40% stenosis.  Patient has no history of TIA or stroke symptom.  The patient has no had amaurosis fugax or monocular blindness.  The patient has no had facial drooping or hemiplegia.  The patient has no had receptive or expressive aphasia.    The patient's risks factors for carotid disease include: DM,hypertention,hypercholesterolema and CAD.  He was a smoker and stopped in 1994.  He currently takes aspirin, metoprolol, amiodarone daily.  He has tried statin drugs and could not tolerate them due to muscular side effects.  Past Medical History  Diagnosis Date  . ESRD (end stage renal disease)     T, Th, Sat dialysis  . Coronary artery disease     a. hx of MI 1992; b.echo 12/02/12: Mild LVH, EF 50%, grade 1 diastolic dysfunction, mild MR, mild LAE, PASP 22.  c.  Myoview 12/09/12: Inferior defect with inferior and septal HK (favor prior MI), EF 47%  . Diabetes mellitus   . Hypertension   . Arthritis   . Carotid artery disease 09/2012    was due for R CEA in March 2014.   . Gastritis 2012 and 09/2011    treated for H Pylori in 12/2011  . Diabetic neuropathy   . Diabetic nephropathy   . Osteoporosis   . COPD (chronic obstructive pulmonary disease)     Past Surgical History  Procedure Laterality Date  . Coronary stent placement    . Hip surgery      both hips  . Av fistula placement      Left arm     History   Social History  . Marital Status: Married    Spouse Name: N/A    Number of Children: N/A  . Years of Education: N/A   Occupational History  . Not on file.   Social History Main Topics  . Smoking status: Former Smoker -- 1.00 packs/day for 25 years    Types: Cigarettes    Start date: 08/18/1965    Quit date: 08/18/1990  . Smokeless tobacco: Not on file  . Alcohol Use: Yes     Comment: occasionally  . Drug Use: No  . Sexually Active: Not on file   Other Topics Concern  . Not on file   Social History Narrative  . No narrative on file    Family History  Problem Relation Age of Onset  . Heart attack Father 33  . Heart disease Father   . Stroke Mother   . Hypertension Mother   . Other Mother     varicose veins  . Stroke Maternal Grandmother   . Hypertension Brother       Current Outpatient Prescriptions on File Prior to Visit  Medication Sig Dispense Refill  . allopurinol (ZYLOPRIM) 100 MG tablet Take 100 mg by mouth 2 (two) times daily.       [name] amiodarone (PACERONE) 200 MG tablet Take 200 mg by  mouth daily.      [name] aspirin 81 MG chewable tablet Chew 1 tablet (81 mg total) by mouth daily.      . fish oil-omega-3 fatty acids 1000 MG capsule Take 1 g by mouth 2 (two) times daily.       [name] gabapentin (NEURONTIN) 300 MG capsule Take 600 mg by mouth 2 (two) times daily.       . insulin aspart protamine-insulin aspart (NOVOLOG 70/30) (70-30) 100 UNIT/ML injection Inject 2-10 Units into the skin 3 (three) times daily as needed (Per sliding scale, based on sugar levels).      . insulin glargine (LANTUS) 100 UNIT/ML injection Inject 15 Units into the skin at bedtime.      . lidocaine-prilocaine (EMLA) cream Apply 1 application topically every Monday, Wednesday, and Friday.       . metoprolol succinate (TOPROL-XL) 50 MG 24 hr tablet Take 25-50 mg by mouth 2 (two) times daily. Take 50 mg in the am and 25 mg in the pm. Take with or immediately following a meal.       . Multiple Vitamins-Minerals (MULTIVITAMIN WITH MINERALS) tablet Take 1 tablet by mouth daily.      . multivitamin (RENA-VIT) TABS tablet Take 1 tablet by mouth at bedtime.       . pantoprazole (PROTONIX) 40 MG tablet Take 40 mg by mouth daily.      . sevelamer carbonate (RENVELA) 800 MG tablet Take 800 mg by mouth 3 (three) times daily with meals.      . tamsulosin (FLOMAX) 0.4 MG CAPS Take 0.4 mg by mouth at bedtime.      . traMADol (ULTRAM) 50 MG tablet Take 50 mg by mouth every 6 (six) hours as needed for pain.       No current facility-administered medications on file prior to visit.    Allergies  Allergen Reactions  . Statins Other (See Comments)    Muscle weakness     REVIEW OF SYSTEMS:  (Positives checked otherwise negative)  CARDIOVASCULAR:  []  chest pain, []  chest pressure, []  palpitations, []  shortness of breath when laying flat, []  shortness of breath with exertion,  []  pain in feet when walking, []  pain in feet when laying flat, []  history of blood clot in veins (DVT), []  history of phlebitis, []  swelling in legs, []  varicose veins  PULMONARY:  []  productive cough, []  asthma, []  wheezing [x]  COPD  NEUROLOGIC:  []  weakness in arms or legs, []  numbness in arms or legs, []  difficulty speaking or slurred speech, []  temporary loss of vision in one eye, []  dizziness [x]  weakness in left hand  HEMATOLOGIC:  [x]  bleeding problems GI 1 episode, []  problems with blood clotting too easily  MUSCULOSKEL:  [x]  joint pain, [x]  joint swelling right elbo gout  GASTROINTEST:  []  vomiting blood, [x]  blood in stool 1 episode    GENITOURINARY:  []  burning with urination, []  blood in urine  PSYCHIATRIC:  []  history of major depression  INTEGUMENTARY:  []  rashes, []  ulcers  CONSTITUTIONAL:  []  fever, []  chills  PRE-ADM LIVING: [x]  Home, []  Nursing home, []  Homeless  AMB STATUS: []  Walking, []  Walking w/ Assistance, []  Wheelchair, [] Bed ridden  For [employer] Use RECENT HEART ATTACK (<6  mon): No  CAD Sx: []  No, [x]  Asymptomatic, h/o heart attack, []  Stable angina, []  Unstable angina  PRIOR CHF: []  No, []  Asymptomatic , []  Mild, [x]  Moderate, []  Severe  STRESS TEST: []  No, [x]  Normal, []  + ischemia, []  +  Heart attack, []  Both  MI history 1992 Physical Examination  Filed Vitals:   01/27/13 1228  BP: 110/59  Pulse: 63  Height: 5\' 6"  (1.676 m)  Weight: 178 lb (80.74 kg)  SpO2: 99%    Body mass index is 28.74 kg/(m^2).  General: A&O x 3, WDWN, Head: [place]/AT, no Temporalis wasting, Ear/Nose/Throat: Hearing grossly intact, nares w/o erythema or drainage, oropharynx w/o Erythema/Exudate,  Eyes: PERRLA, EOMI Neck: Supple,  no carotid bruit Pulmonary: Sym exp, good air movt, CTAB, no rales, rhonchi, & wheezing,  Cardiac: RRR, Nl S1, S2, no Murmurs, rubs or gallops Vascular: Vessel Right Left  Radial Palpable Palpable  Ulnar Palpable Palpable  Brachial Palpable Palpable  Carotid Palpable, without bruit Palpable, without bruit  Aorta Not palpable N/A  Femoral Palpable Palpable  Popliteal Not palpable Not palpable  PT  Palpable  Palpable  DP  Palpable  Palpable   Gastrointestinal: soft, NTND, -G/R, - HSM, - masses, + BS  Musculoskeletal: M/S 5/5 throughout  except left hand -4/5, Extremities without ischemic changes    Neurologic: CN 2-12 intact, Pain and light touch intact in extremities  except LE peripheral neuropathy decreased sensation, Motor exam as listed above  Psychiatric: Judgment intact, Mood & affect appropriate for pt's clinical situation,   Dermatologic: See M/S exam for extremity exam, no rashes otherwise noted  Lymph : No Cervical, Axillary, or Inguinal lymphadenopathy    Non-Invasive Vascular Imaging  CAROTID DUPLEX (Date: 01/20/2013):   R ICA stenosis: >80%  L ICA stenosis: <40%    Medical Decision Making  Nicholas [name] is a 72 y.o. male who presents with: right ICA stenosis.   Based on the patient's vascular studies and  examination, I have offered the patient: Right carotid endarterectomy by Dr. Josephina Gip in combination with his CABG surgery 02/03/2013.   Thomasena Edis Copelan [facility] PA-C [facility] [place] Office: [phone]   01/27/2013, 1:11 PM   History details and exam as above. The patient is scheduled for coronary bypass grafting on June 19. He has a high-grade right internal carotid artery stenosis (although the velocities are not extremely high the vessel is calcified and probably underestimates) with a moderate left internal carotid artery stenosis. I believe he would benefit from combined carotid endarterectomy and coronary bypass grafting with overall fairly low risk. Risks benefits possible complications and procedure details including but not limited to bleeding infection stroke risk of 2-3% cranial nerve injury were explained the patient today. I also explained to the patient that his risk of stroke is slightly higher with the combined procedure rather than carotid endarterectomy alone. However I also informed him that his risk of stroke from coronary bypass grafting will probably be decreased somewhat from carotid endarterectomy prior to his coronary bypass grafting. He understands and agrees to proceed. Since his coronary bypass grafting has artery been scheduled for a Thursday when I am in the office we will schedule his carotid endarterectomy with my partner Dr. Hart Rochester.  Fabienne Bruns, MD [facility] [place] Office: [phone] Pager: [phone]

## 2013-02-01 ENCOUNTER — Encounter (HOSPITAL_COMMUNITY): Payer: Self-pay

## 2013-02-01 ENCOUNTER — Ambulatory Visit (HOSPITAL_COMMUNITY)
Admission: RE | Admit: 2013-02-01 | Discharge: 2013-02-01 | Disposition: A | Discharge status: Home / Self Care | Payer: Medicare Other | Source: Ambulatory Visit | Attending: Thoracic Surgery (Cardiothoracic Vascular Surgery) | Admitting: Thoracic Surgery (Cardiothoracic Vascular Surgery)

## 2013-02-01 ENCOUNTER — Encounter (HOSPITAL_COMMUNITY)
Admission: RE | Admit: 2013-02-01 | Discharge: 2013-02-01 | Disposition: A | Discharge status: Home / Self Care | Payer: Medicare Other | Source: Ambulatory Visit | Attending: Thoracic Surgery (Cardiothoracic Vascular Surgery) | Admitting: Thoracic Surgery (Cardiothoracic Vascular Surgery)

## 2013-02-01 VITALS — BP 108/67 | HR 63 | Temp 97.5°F | Resp 20 | Ht 62.0 in | Wt 180.0 lb

## 2013-02-01 DIAGNOSIS — Z01811 Encounter for preprocedural respiratory examination: Secondary | ICD-10-CM | POA: Insufficient documentation

## 2013-02-01 DIAGNOSIS — I2510 Atherosclerotic heart disease of native coronary artery without angina pectoris: Secondary | ICD-10-CM

## 2013-02-01 DIAGNOSIS — Z01818 Encounter for other preprocedural examination: Secondary | ICD-10-CM | POA: Insufficient documentation

## 2013-02-01 DIAGNOSIS — Z01810 Encounter for preprocedural cardiovascular examination: Secondary | ICD-10-CM

## 2013-02-01 DIAGNOSIS — Z01812 Encounter for preprocedural laboratory examination: Secondary | ICD-10-CM | POA: Insufficient documentation

## 2013-02-01 HISTORY — DX: Gastro-esophageal reflux disease without esophagitis: K21.9

## 2013-02-01 HISTORY — DX: Depression, unspecified: F32.A

## 2013-02-01 HISTORY — DX: Anxiety disorder, unspecified: F41.9

## 2013-02-01 HISTORY — DX: Myoneural disorder, unspecified: G70.9

## 2013-02-01 HISTORY — DX: Anemia, unspecified: D64.9

## 2013-02-01 HISTORY — DX: Major depressive disorder, single episode, unspecified: F32.9

## 2013-02-01 LAB — CBC
MCV: 98.3 fL (ref 78.0–100.0)
Platelets: 265 10*3/uL (ref 150–400)
RBC: 3.43 MIL/uL — ABNORMAL LOW (ref 4.22–5.81)
RDW: 17.3 % — ABNORMAL HIGH (ref 11.5–15.5)
WBC: 7.7 10*3/uL (ref 4.0–10.5)

## 2013-02-01 LAB — BLOOD GAS, ARTERIAL
Acid-Base Excess: 4.5 mmol/L — ABNORMAL HIGH (ref 0.0–2.0)
Drawn by: 344381
TCO2: 29.3 mmol/L (ref 0–100)
pCO2 arterial: 39 mmHg (ref 35.0–45.0)
pH, Arterial: 7.471 — ABNORMAL HIGH (ref 7.350–7.450)
pO2, Arterial: 103 mmHg — ABNORMAL HIGH (ref 80.0–100.0)

## 2013-02-01 LAB — SURGICAL PCR SCREEN: Staphylococcus aureus: NEGATIVE

## 2013-02-01 LAB — APTT: aPTT: 25 seconds (ref 24–37)

## 2013-02-01 LAB — COMPREHENSIVE METABOLIC PANEL
ALT: 9 U/L (ref 0–53)
AST: 17 U/L (ref 0–37)
Albumin: 3.1 g/dL — ABNORMAL LOW (ref 3.5–5.2)
Alkaline Phosphatase: 102 U/L (ref 39–117)
CO2: 24 mEq/L (ref 19–32)
Chloride: 97 mEq/L (ref 96–112)
GFR calc non Af Amer: 15 mL/min — ABNORMAL LOW (ref >90)
Potassium: 3.3 mEq/L — ABNORMAL LOW (ref 3.5–5.1)
Total Bilirubin: 0.2 mg/dL — ABNORMAL LOW (ref 0.3–1.2)

## 2013-02-01 LAB — PULMONARY FUNCTION TEST

## 2013-02-01 MED ORDER — CHLORHEXIDINE GLUCONATE 4 % EX LIQD: 30.0000 mL | CUTANEOUS | Status: DC

## 2013-02-01 NOTE — Pre-Procedure Instructions (Signed)
Nicholas [name]  02/01/2013   Your procedure is scheduled on:  02-03-2013  Thursday    Report to [facility] at 5:30 AM.Enter thru Main Entrance and take the  [facility] elevators to the 3rd floor.Check in at Short Stay desk.   Call this number if you have problems the morning of surgery: [phone]   Remember:   Do not eat food or drink liquids after midnight.   Take these medicines the morning of surgery with A SIP OF WATER: allopurinol,amiodarone,gabapentin,metoprolol, protonix,flomax               DO NOT TAKE ANY INSULIN THE MORNING OF SURGERY               STOP ALL NSAIDS   Do not wear jewelry,  Do not wear lotions, powders, or perfumes.   Do not shave 48 hours prior to surgery. Men may shave face and neck.  Do not bring valuables to the hospital.  [facility] is not responsible  or any belongings or valuables.  Contacts, dentures or bridgework may not be worn into surgery.  Leave suitcase in the car. After surgery it may be brought to your room.   For patients admitted to the hospital, checkout time is 11:00 AM the day of discharge.   Patients discharged the day of surgery will not be allowed to drive home.    Special Instructions: Shower using CHG 2 nights before surgery and the night before surgery.  If you shower the day of surgery use CHG.  Use special wash - you have one bottle of CHG for all showers.  You should use approximately 1/3 of the bottle for each shower.    Please read over the following fact sheets that you were given: Pain Booklet, Coughing and Deep Breathing, Blood Transfusion Information and Surgical Site Infection Prevention

## 2013-02-01 NOTE — Progress Notes (Signed)
Paged Nicholas [name] to come see pt.  She is unable to come will call pt. At home. Cardiac Surgery Book given to patient.

## 2013-02-01 NOTE — Progress Notes (Signed)
VASCULAR LAB PRELIMINARY  PRELIMINARY  PRELIMINARY  PRELIMINARY  Pre-op Cardiac Surgery  Carotid Findings:  Right >80% and left <39% on study from 01-20-13.  Plan right CEA later.    Upper Extremity Right Left  Brachial Pressures 111 triphasic   Radial Waveforms triphasic   Ulnar Waveforms triphasic   Palmar Arch (Allen's Test) WNL    Findings:  Doppler waveforms remain normal with ulnar and radial compressions on the right.  Left not done due to dialysis graft.    Lower  Extremity Right Left  Dorsalis Pedis    Anterior Tibial 93 monophasic 111 monophasic  Posterior Tibial 107 monophasic 111 triphasic  Ankle/Brachial Indices 0.96 1.0    Findings:  ABI is within normal limits with abnormal Doppler waveforms noted in the right posterior tibial artery and bilateral anterior tibial arteries.   Nicholas [name], RVT 02/01/2013, 1:38 PM

## 2013-02-01 NOTE — Progress Notes (Signed)
Unable to give urine speciman at pre-admit visit,will try to bring the morning of surgery.

## 2013-02-02 ENCOUNTER — Encounter (HOSPITAL_COMMUNITY): Payer: Self-pay | Admitting: Certified Registered Nurse Anesthetist

## 2013-02-02 MED ORDER — POTASSIUM CHLORIDE 2 MEQ/ML IV SOLN
80.0000 meq | INTRAVENOUS | Status: DC
  Filled 2013-02-02: qty 40

## 2013-02-02 MED ORDER — DEXMEDETOMIDINE HCL IN NACL 400 MCG/100ML IV SOLN
0.1000 ug/kg/h | INTRAVENOUS | Status: AC
  Administered 2013-02-03: 0.5 ug/kg/h via INTRAVENOUS
  Filled 2013-02-02: qty 100

## 2013-02-02 MED ORDER — SODIUM CHLORIDE 0.9 % IV SOLN
INTRAVENOUS | Status: AC
  Administered 2013-02-03: 14:00:00 via INTRAVENOUS
  Administered 2013-02-03: 69.8 mL/h via INTRAVENOUS
  Filled 2013-02-02: qty 40

## 2013-02-02 MED ORDER — MAGNESIUM SULFATE 50 % IJ SOLN
40.0000 meq | INTRAMUSCULAR | Status: DC
  Filled 2013-02-02: qty 10

## 2013-02-02 MED ORDER — SODIUM CHLORIDE 0.9 % IV SOLN
INTRAVENOUS | Status: DC
  Filled 2013-02-02: qty 30

## 2013-02-02 MED ORDER — DEXTROSE 5 % IV SOLN
1.5000 g | INTRAVENOUS | Status: AC
  Administered 2013-02-03: 1.5 g via INTRAVENOUS
  Administered 2013-02-03: .75 g via INTRAVENOUS
  Filled 2013-02-02 (×2): qty 1.5

## 2013-02-02 MED ORDER — PLASMA-LYTE 148 IV SOLN
INTRAVENOUS | Status: AC
  Administered 2013-02-03: 08:00:00
  Filled 2013-02-02: qty 2.5

## 2013-02-02 MED ORDER — VANCOMYCIN HCL 10 G IV SOLR
1250.0000 mg | INTRAVENOUS | Status: AC
  Administered 2013-02-03: 1250 mg via INTRAVENOUS
  Filled 2013-02-02: qty 1250

## 2013-02-02 MED ORDER — PHENYLEPHRINE HCL 10 MG/ML IJ SOLN
30.0000 ug/min | INTRAMUSCULAR | Status: AC
  Administered 2013-02-03: 20 ug/min via INTRAVENOUS
  Filled 2013-02-02: qty 2

## 2013-02-02 MED ORDER — NITROGLYCERIN IN D5W 200-5 MCG/ML-% IV SOLN
2.0000 ug/min | INTRAVENOUS | Status: AC
  Administered 2013-02-03: 5 ug/min via INTRAVENOUS
  Filled 2013-02-02: qty 250

## 2013-02-02 MED ORDER — DEXTROSE 5 % IV SOLN
750.0000 mg | INTRAVENOUS | Status: DC
  Filled 2013-02-02: qty 750

## 2013-02-02 MED ORDER — SODIUM CHLORIDE 0.9 % IV SOLN
INTRAVENOUS | Status: AC
  Administered 2013-02-03: 2.2 [IU]/h via INTRAVENOUS
  Filled 2013-02-02: qty 1

## 2013-02-02 MED ORDER — DOPAMINE-DEXTROSE 3.2-5 MG/ML-% IV SOLN
2.0000 ug/kg/min | INTRAVENOUS | Status: DC
  Filled 2013-02-02: qty 250

## 2013-02-02 MED ORDER — EPINEPHRINE HCL 1 MG/ML IJ SOLN
0.5000 ug/min | INTRAMUSCULAR | Status: DC
  Filled 2013-02-02: qty 4

## 2013-02-03 ENCOUNTER — Encounter (HOSPITAL_COMMUNITY)
Admission: RE | Disposition: A | Discharge status: Skilled Nursing Facility | Payer: Self-pay | Source: Ambulatory Visit | Attending: Thoracic Surgery (Cardiothoracic Vascular Surgery)

## 2013-02-03 ENCOUNTER — Inpatient Hospital Stay (HOSPITAL_COMMUNITY)
Admission: RE | Admit: 2013-02-03 | Discharge: 2013-02-15 | DRG: 235 | Disposition: A | Discharge status: Skilled Nursing Facility | Payer: Medicare Other | Source: Ambulatory Visit | Attending: Thoracic Surgery (Cardiothoracic Vascular Surgery) | Admitting: Thoracic Surgery (Cardiothoracic Vascular Surgery)

## 2013-02-03 ENCOUNTER — Encounter (HOSPITAL_COMMUNITY): Payer: Self-pay | Admitting: Surgery

## 2013-02-03 ENCOUNTER — Other Ambulatory Visit: Payer: Self-pay

## 2013-02-03 ENCOUNTER — Encounter (HOSPITAL_COMMUNITY): Payer: Self-pay | Admitting: Certified Registered Nurse Anesthetist

## 2013-02-03 ENCOUNTER — Inpatient Hospital Stay (HOSPITAL_COMMUNITY): Payer: Medicare Other

## 2013-02-03 ENCOUNTER — Inpatient Hospital Stay (HOSPITAL_COMMUNITY): Payer: Medicare Other | Admitting: Certified Registered Nurse Anesthetist

## 2013-02-03 DIAGNOSIS — N058 Unspecified nephritic syndrome with other morphologic changes: Secondary | ICD-10-CM | POA: Diagnosis present

## 2013-02-03 DIAGNOSIS — M109 Gout, unspecified: Secondary | ICD-10-CM | POA: Diagnosis not present

## 2013-02-03 DIAGNOSIS — E876 Hypokalemia: Secondary | ICD-10-CM | POA: Diagnosis not present

## 2013-02-03 DIAGNOSIS — I6529 Occlusion and stenosis of unspecified carotid artery: Secondary | ICD-10-CM | POA: Diagnosis present

## 2013-02-03 DIAGNOSIS — I959 Hypotension, unspecified: Secondary | ICD-10-CM | POA: Diagnosis not present

## 2013-02-03 DIAGNOSIS — J90 Pleural effusion, not elsewhere classified: Secondary | ICD-10-CM | POA: Diagnosis not present

## 2013-02-03 DIAGNOSIS — D631 Anemia in chronic kidney disease: Secondary | ICD-10-CM | POA: Diagnosis present

## 2013-02-03 DIAGNOSIS — J988 Other specified respiratory disorders: Secondary | ICD-10-CM | POA: Diagnosis not present

## 2013-02-03 DIAGNOSIS — I252 Old myocardial infarction: Secondary | ICD-10-CM

## 2013-02-03 DIAGNOSIS — Z794 Long term (current) use of insulin: Secondary | ICD-10-CM

## 2013-02-03 DIAGNOSIS — Z951 Presence of aortocoronary bypass graft: Secondary | ICD-10-CM

## 2013-02-03 DIAGNOSIS — I2510 Atherosclerotic heart disease of native coronary artery without angina pectoris: Secondary | ICD-10-CM

## 2013-02-03 DIAGNOSIS — E1149 Type 2 diabetes mellitus with other diabetic neurological complication: Secondary | ICD-10-CM | POA: Diagnosis present

## 2013-02-03 DIAGNOSIS — N2581 Secondary hyperparathyroidism of renal origin: Secondary | ICD-10-CM | POA: Diagnosis present

## 2013-02-03 DIAGNOSIS — F3289 Other specified depressive episodes: Secondary | ICD-10-CM | POA: Diagnosis present

## 2013-02-03 DIAGNOSIS — F329 Major depressive disorder, single episode, unspecified: Secondary | ICD-10-CM | POA: Diagnosis present

## 2013-02-03 DIAGNOSIS — D62 Acute posthemorrhagic anemia: Secondary | ICD-10-CM | POA: Diagnosis not present

## 2013-02-03 DIAGNOSIS — F411 Generalized anxiety disorder: Secondary | ICD-10-CM | POA: Diagnosis present

## 2013-02-03 DIAGNOSIS — Z9889 Other specified postprocedural states: Secondary | ICD-10-CM

## 2013-02-03 DIAGNOSIS — N186 End stage renal disease: Secondary | ICD-10-CM | POA: Diagnosis present

## 2013-02-03 DIAGNOSIS — J449 Chronic obstructive pulmonary disease, unspecified: Secondary | ICD-10-CM | POA: Diagnosis present

## 2013-02-03 DIAGNOSIS — E1142 Type 2 diabetes mellitus with diabetic polyneuropathy: Secondary | ICD-10-CM | POA: Diagnosis present

## 2013-02-03 DIAGNOSIS — Z992 Dependence on renal dialysis: Secondary | ICD-10-CM

## 2013-02-03 DIAGNOSIS — Z96649 Presence of unspecified artificial hip joint: Secondary | ICD-10-CM

## 2013-02-03 DIAGNOSIS — N039 Chronic nephritic syndrome with unspecified morphologic changes: Secondary | ICD-10-CM | POA: Diagnosis present

## 2013-02-03 DIAGNOSIS — Z87891 Personal history of nicotine dependence: Secondary | ICD-10-CM

## 2013-02-03 DIAGNOSIS — E1129 Type 2 diabetes mellitus with other diabetic kidney complication: Secondary | ICD-10-CM | POA: Diagnosis present

## 2013-02-03 DIAGNOSIS — J9819 Other pulmonary collapse: Secondary | ICD-10-CM | POA: Diagnosis not present

## 2013-02-03 DIAGNOSIS — I120 Hypertensive chronic kidney disease with stage 5 chronic kidney disease or end stage renal disease: Secondary | ICD-10-CM | POA: Diagnosis present

## 2013-02-03 DIAGNOSIS — I2589 Other forms of chronic ischemic heart disease: Secondary | ICD-10-CM | POA: Diagnosis present

## 2013-02-03 DIAGNOSIS — M810 Age-related osteoporosis without current pathological fracture: Secondary | ICD-10-CM | POA: Diagnosis present

## 2013-02-03 DIAGNOSIS — J4489 Other specified chronic obstructive pulmonary disease: Secondary | ICD-10-CM | POA: Diagnosis present

## 2013-02-03 DIAGNOSIS — K219 Gastro-esophageal reflux disease without esophagitis: Secondary | ICD-10-CM | POA: Diagnosis present

## 2013-02-03 HISTORY — PX: CORONARY ARTERY BYPASS GRAFT: SHX141

## 2013-02-03 HISTORY — PX: ENDARTERECTOMY: SHX5162

## 2013-02-03 LAB — POCT I-STAT 4, (NA,K, GLUC, HGB,HCT)
Glucose, Bld: 132 mg/dL — ABNORMAL HIGH (ref 70–99)
Glucose, Bld: 97 mg/dL (ref 70–99)
HCT: 26 % — ABNORMAL LOW (ref 39.0–52.0)
HCT: 26 % — ABNORMAL LOW (ref 39.0–52.0)
Hemoglobin: 9.5 g/dL — ABNORMAL LOW (ref 13.0–17.0)
Hemoglobin: 8.8 g/dL — ABNORMAL LOW (ref 13.0–17.0)
Potassium: 3.6 mEq/L (ref 3.5–5.1)
Potassium: 3.3 mEq/L — ABNORMAL LOW (ref 3.5–5.1)
Potassium: 4.7 mEq/L (ref 3.5–5.1)
Potassium: 3.9 mEq/L (ref 3.5–5.1)
Sodium: 138 mEq/L (ref 135–145)
Sodium: 136 mEq/L (ref 135–145)
Sodium: 136 mEq/L (ref 135–145)
Sodium: 136 mEq/L (ref 135–145)

## 2013-02-03 LAB — CBC
HCT: 25.8 % — ABNORMAL LOW (ref 39.0–52.0)
MCH: 31.4 pg (ref 26.0–34.0)
MCHC: 32.2 g/dL (ref 30.0–36.0)
MCV: 97.7 fL (ref 78.0–100.0)
Platelets: 165 10*3/uL (ref 150–400)
RBC: 2.64 MIL/uL — ABNORMAL LOW (ref 4.22–5.81)
RDW: 17 % — ABNORMAL HIGH (ref 11.5–15.5)
WBC: 8.1 10*3/uL (ref 4.0–10.5)

## 2013-02-03 LAB — POCT I-STAT 3, ART BLOOD GAS (G3+)
Acid-Base Excess: 8 mmol/L — ABNORMAL HIGH (ref 0.0–2.0)
Acid-Base Excess: 5 mmol/L — ABNORMAL HIGH (ref 0.0–2.0)
Bicarbonate: 25.9 mEq/L — ABNORMAL HIGH (ref 20.0–24.0)
O2 Saturation: 100 %
TCO2: 27 mmol/L (ref 0–100)
pCO2 arterial: 31.4 mmHg — ABNORMAL LOW (ref 35.0–45.0)
pH, Arterial: 7.52 — ABNORMAL HIGH (ref 7.350–7.450)
pO2, Arterial: 285 mmHg — ABNORMAL HIGH (ref 80.0–100.0)
pO2, Arterial: 335 mmHg — ABNORMAL HIGH (ref 80.0–100.0)
pO2, Arterial: 75 mmHg — ABNORMAL LOW (ref 80.0–100.0)

## 2013-02-03 LAB — PLATELET COUNT: Platelets: 204 10*3/uL (ref 150–400)

## 2013-02-03 LAB — URINALYSIS, ROUTINE W REFLEX MICROSCOPIC
Bilirubin Urine: NEGATIVE
Glucose, UA: NEGATIVE mg/dL
Hgb urine dipstick: NEGATIVE
Ketones, ur: NEGATIVE mg/dL
Leukocytes, UA: NEGATIVE
Nitrite: NEGATIVE
Protein, ur: 100 mg/dL — AB
Specific Gravity, Urine: 1.017 (ref 1.005–1.030)
Urobilinogen, UA: 0.2 mg/dL (ref 0.0–1.0)
pH: 7.5 (ref 5.0–8.0)

## 2013-02-03 LAB — HEMOGLOBIN AND HEMATOCRIT, BLOOD
HCT: 23.5 % — ABNORMAL LOW (ref 39.0–52.0)
Hemoglobin: 7.7 g/dL — ABNORMAL LOW (ref 13.0–17.0)

## 2013-02-03 LAB — PROTIME-INR
INR: 1.48 (ref 0.00–1.49)
Prothrombin Time: 17.5 seconds — ABNORMAL HIGH (ref 11.6–15.2)

## 2013-02-03 LAB — URINE MICROSCOPIC-ADD ON

## 2013-02-03 LAB — GLUCOSE, CAPILLARY: Glucose-Capillary: 88 mg/dL (ref 70–99)

## 2013-02-03 SURGERY — CORONARY ARTERY BYPASS GRAFTING (CABG)
Anesthesia: General | Site: Chest | Wound class: Clean

## 2013-02-03 MED ORDER — PHENYLEPHRINE HCL 10 MG/ML IJ SOLN
0.0000 ug/min | INTRAMUSCULAR | Status: DC
  Administered 2013-02-04: 30 ug/min via INTRAVENOUS
  Filled 2013-02-03 (×3): qty 2

## 2013-02-03 MED ORDER — ROCURONIUM BROMIDE 100 MG/10ML IV SOLN
INTRAVENOUS | Status: DC | PRN
  Administered 2013-02-03 (×2): 50 mg via INTRAVENOUS

## 2013-02-03 MED ORDER — SODIUM CHLORIDE 0.9 % IJ SOLN
3.0000 mL | Freq: Two times a day (BID) | INTRAMUSCULAR | Status: DC
  Administered 2013-02-04 – 2013-02-07 (×5): 3 mL via INTRAVENOUS

## 2013-02-03 MED ORDER — LACTATED RINGERS IV SOLN: 500.0000 mL | Freq: Once | INTRAVENOUS | Status: AC | PRN

## 2013-02-03 MED ORDER — DEXMEDETOMIDINE HCL IN NACL 400 MCG/100ML IV SOLN
0.1000 ug/kg/h | INTRAVENOUS | Status: DC
  Filled 2013-02-03: qty 100

## 2013-02-03 MED ORDER — MIDAZOLAM HCL 2 MG/2ML IJ SOLN: 2.0000 mg | INTRAMUSCULAR | Status: DC | PRN

## 2013-02-03 MED ORDER — TAMSULOSIN HCL 0.4 MG PO CAPS
0.4000 mg | ORAL_CAPSULE | Freq: Every day | ORAL | Status: DC
  Administered 2013-02-04 – 2013-02-14 (×11): 0.4 mg via ORAL
  Filled 2013-02-03 (×12): qty 1

## 2013-02-03 MED ORDER — ATROPINE SULFATE 1 MG/ML IJ SOLN
INTRAMUSCULAR | Status: DC | PRN
  Administered 2013-02-03 (×2): .1 mg via INTRAVENOUS
  Administered 2013-02-03: 0.2 mg via INTRAVENOUS
  Administered 2013-02-03: .1 mg via INTRAVENOUS

## 2013-02-03 MED ORDER — NITROGLYCERIN IN D5W 200-5 MCG/ML-% IV SOLN: 0.0000 ug/min | INTRAVENOUS | Status: DC

## 2013-02-03 MED ORDER — ALBUMIN HUMAN 5 % IV SOLN
250.0000 mL | INTRAVENOUS | Status: AC | PRN
  Administered 2013-02-04 (×2): 250 mL via INTRAVENOUS
  Filled 2013-02-03 (×2): qty 250

## 2013-02-03 MED ORDER — INSULIN REGULAR BOLUS VIA INFUSION
0.0000 [IU] | Freq: Three times a day (TID) | INTRAVENOUS | Status: AC
  Filled 2013-02-03: qty 10

## 2013-02-03 MED ORDER — VECURONIUM BROMIDE 10 MG IV SOLR
INTRAVENOUS | Status: DC | PRN
  Administered 2013-02-03 (×2): 10 mg via INTRAVENOUS

## 2013-02-03 MED ORDER — HEPARIN SODIUM (PORCINE) 1000 UNIT/ML IJ SOLN
INTRAMUSCULAR | Status: DC | PRN
  Administered 2013-02-03: 2000 [IU] via INTRAVENOUS
  Administered 2013-02-03: 32000 [IU] via INTRAVENOUS
  Administered 2013-02-03: 6000 [IU] via INTRAVENOUS

## 2013-02-03 MED ORDER — PROPOFOL 10 MG/ML IV BOLUS
INTRAVENOUS | Status: DC | PRN
  Administered 2013-02-03: 40 mg via INTRAVENOUS

## 2013-02-03 MED ORDER — LACTATED RINGERS IV SOLN
INTRAVENOUS | Status: DC | PRN
  Administered 2013-02-03 (×2): via INTRAVENOUS

## 2013-02-03 MED ORDER — ACETAMINOPHEN 160 MG/5ML PO SOLN
975.0000 mg | Freq: Four times a day (QID) | ORAL | Status: DC
  Administered 2013-02-04 (×3): 975 mg
  Filled 2013-02-03 (×2): qty 40.6
  Filled 2013-02-03: qty 20.3

## 2013-02-03 MED ORDER — LIDOCAINE HCL (CARDIAC) 20 MG/ML IV SOLN
INTRAVENOUS | Status: DC | PRN
  Administered 2013-02-03: 20 mg via INTRAVENOUS

## 2013-02-03 MED ORDER — SODIUM CHLORIDE 0.9 % IR SOLN
Status: DC | PRN
  Administered 2013-02-03: 08:00:00

## 2013-02-03 MED ORDER — SODIUM CHLORIDE 0.9 % IJ SOLN: 3.0000 mL | INTRAMUSCULAR | Status: DC | PRN

## 2013-02-03 MED ORDER — METOPROLOL TARTRATE 1 MG/ML IV SOLN: 2.5000 mg | INTRAVENOUS | Status: DC | PRN

## 2013-02-03 MED ORDER — DEXTROSE 5 % IV SOLN
1.5000 g | Freq: Two times a day (BID) | INTRAVENOUS | Status: AC
  Administered 2013-02-03 – 2013-02-05 (×4): 1.5 g via INTRAVENOUS
  Filled 2013-02-03 (×4): qty 1.5

## 2013-02-03 MED ORDER — LACTATED RINGERS IV SOLN
INTRAVENOUS | Status: DC | PRN
  Administered 2013-02-03: 07:00:00 via INTRAVENOUS

## 2013-02-03 MED ORDER — FENTANYL CITRATE 0.05 MG/ML IJ SOLN
INTRAMUSCULAR | Status: DC | PRN
  Administered 2013-02-03 (×2): 250 ug via INTRAVENOUS
  Administered 2013-02-03: 150 ug via INTRAVENOUS
  Administered 2013-02-03: 100 ug via INTRAVENOUS
  Administered 2013-02-03: 50 ug via INTRAVENOUS
  Administered 2013-02-03: 1450 ug via INTRAVENOUS
  Administered 2013-02-03 (×2): 250 ug via INTRAVENOUS

## 2013-02-03 MED ORDER — MIDAZOLAM HCL 5 MG/5ML IJ SOLN
INTRAMUSCULAR | Status: DC | PRN
  Administered 2013-02-03: 1 mg via INTRAVENOUS
  Administered 2013-02-03 (×2): 2 mg via INTRAVENOUS
  Administered 2013-02-03: 1 mg via INTRAVENOUS
  Administered 2013-02-03: 4 mg via INTRAVENOUS

## 2013-02-03 MED ORDER — METOPROLOL TARTRATE 25 MG/10 ML ORAL SUSPENSION
12.5000 mg | Freq: Two times a day (BID) | ORAL | Status: DC
  Filled 2013-02-03 (×5): qty 5

## 2013-02-03 MED ORDER — SODIUM CHLORIDE 0.9 % IV SOLN
INTRAVENOUS | Status: DC
  Filled 2013-02-03: qty 40

## 2013-02-03 MED ORDER — SODIUM CHLORIDE 0.9 % IV SOLN
INTRAVENOUS | Status: AC
  Filled 2013-02-03: qty 1

## 2013-02-03 MED ORDER — ALBUMIN HUMAN 5 % IV SOLN
INTRAVENOUS | Status: DC | PRN
  Administered 2013-02-03 (×2): via INTRAVENOUS

## 2013-02-03 MED ORDER — ONDANSETRON HCL 4 MG/2ML IJ SOLN
4.0000 mg | Freq: Four times a day (QID) | INTRAMUSCULAR | Status: DC | PRN
  Administered 2013-02-13 – 2013-02-14 (×3): 4 mg via INTRAVENOUS
  Filled 2013-02-03 (×4): qty 2

## 2013-02-03 MED ORDER — ASPIRIN 81 MG PO CHEW: 324.0000 mg | CHEWABLE_TABLET | Freq: Every day | ORAL | Status: DC

## 2013-02-03 MED ORDER — ASPIRIN EC 325 MG PO TBEC
325.0000 mg | DELAYED_RELEASE_TABLET | Freq: Every day | ORAL | Status: DC
  Administered 2013-02-04 – 2013-02-15 (×12): 325 mg via ORAL
  Filled 2013-02-03 (×12): qty 1

## 2013-02-03 MED ORDER — BISACODYL 5 MG PO TBEC
10.0000 mg | DELAYED_RELEASE_TABLET | Freq: Every day | ORAL | Status: DC
  Administered 2013-02-04 – 2013-02-14 (×10): 10 mg via ORAL
  Filled 2013-02-03: qty 2
  Filled 2013-02-03: qty 1
  Filled 2013-02-03 (×7): qty 2
  Filled 2013-02-03: qty 1
  Filled 2013-02-03: qty 2

## 2013-02-03 MED ORDER — SODIUM CHLORIDE 0.9 % IV SOLN: INTRAVENOUS | Status: DC

## 2013-02-03 MED ORDER — OXYCODONE HCL 5 MG PO TABS
5.0000 mg | ORAL_TABLET | ORAL | Status: DC | PRN
  Administered 2013-02-04: 5 mg via ORAL
  Administered 2013-02-05: 10 mg via ORAL
  Administered 2013-02-05: 5 mg via ORAL
  Administered 2013-02-05 – 2013-02-06 (×3): 10 mg via ORAL
  Administered 2013-02-07 – 2013-02-08 (×3): 5 mg via ORAL
  Administered 2013-02-08 – 2013-02-13 (×15): 10 mg via ORAL
  Filled 2013-02-03: qty 2
  Filled 2013-02-03: qty 1
  Filled 2013-02-03 (×4): qty 2
  Filled 2013-02-03: qty 1
  Filled 2013-02-03 (×5): qty 2
  Filled 2013-02-03: qty 1
  Filled 2013-02-03 (×2): qty 2
  Filled 2013-02-03: qty 1
  Filled 2013-02-03: qty 2
  Filled 2013-02-03 (×2): qty 1
  Filled 2013-02-03 (×6): qty 2

## 2013-02-03 MED ORDER — VANCOMYCIN HCL IN DEXTROSE 1-5 GM/200ML-% IV SOLN
1000.0000 mg | Freq: Once | INTRAVENOUS | Status: AC
  Administered 2013-02-03: 1000 mg via INTRAVENOUS
  Filled 2013-02-03: qty 200

## 2013-02-03 MED ORDER — DOCUSATE SODIUM 100 MG PO CAPS
200.0000 mg | ORAL_CAPSULE | Freq: Every day | ORAL | Status: DC
  Administered 2013-02-04 – 2013-02-13 (×10): 200 mg via ORAL
  Filled 2013-02-03 (×12): qty 2
  Filled 2013-02-03: qty 1

## 2013-02-03 MED ORDER — EPHEDRINE SULFATE 50 MG/ML IJ SOLN
INTRAMUSCULAR | Status: DC | PRN
  Administered 2013-02-03 (×2): 10 mg via INTRAVENOUS

## 2013-02-03 MED ORDER — POTASSIUM CHLORIDE 10 MEQ/50ML IV SOLN: 10.0000 meq | INTRAVENOUS | Status: AC

## 2013-02-03 MED ORDER — MICROFIBRILLAR COLL HEMOSTAT EX PADS: MEDICATED_PAD | CUTANEOUS | Status: DC | PRN

## 2013-02-03 MED ORDER — SODIUM CHLORIDE 0.9 % IJ SOLN
OROMUCOSAL | Status: DC | PRN
  Administered 2013-02-03 (×3): via TOPICAL

## 2013-02-03 MED ORDER — SODIUM CHLORIDE 0.45 % IV SOLN
INTRAVENOUS | Status: DC
  Administered 2013-02-03: 15:00:00 via INTRAVENOUS

## 2013-02-03 MED ORDER — 0.9 % SODIUM CHLORIDE (POUR BTL) OPTIME
TOPICAL | Status: DC | PRN
  Administered 2013-02-03: 6000 mL

## 2013-02-03 MED ORDER — LACTATED RINGERS IV SOLN: INTRAVENOUS | Status: DC

## 2013-02-03 MED ORDER — MORPHINE SULFATE 2 MG/ML IJ SOLN
1.0000 mg | INTRAMUSCULAR | Status: AC | PRN
  Filled 2013-02-03: qty 1

## 2013-02-03 MED ORDER — PANTOPRAZOLE SODIUM 40 MG PO TBEC
40.0000 mg | DELAYED_RELEASE_TABLET | Freq: Every day | ORAL | Status: DC
  Administered 2013-02-05 – 2013-02-15 (×11): 40 mg via ORAL
  Filled 2013-02-03 (×10): qty 1

## 2013-02-03 MED ORDER — MORPHINE SULFATE 2 MG/ML IJ SOLN
2.0000 mg | INTRAMUSCULAR | Status: DC | PRN
  Administered 2013-02-05: 4 mg via INTRAVENOUS
  Administered 2013-02-05: 2 mg via INTRAVENOUS
  Filled 2013-02-03: qty 1
  Filled 2013-02-03: qty 2
  Filled 2013-02-03: qty 1

## 2013-02-03 MED ORDER — DEXMEDETOMIDINE HCL IN NACL 200 MCG/50ML IV SOLN: 0.1000 ug/kg/h | INTRAVENOUS | Status: DC

## 2013-02-03 MED ORDER — SEVELAMER CARBONATE 800 MG PO TABS
800.0000 mg | ORAL_TABLET | Freq: Three times a day (TID) | ORAL | Status: DC
  Administered 2013-02-04 – 2013-02-15 (×30): 800 mg via ORAL
  Filled 2013-02-03 (×37): qty 1

## 2013-02-03 MED ORDER — GABAPENTIN 300 MG PO CAPS
600.0000 mg | ORAL_CAPSULE | Freq: Two times a day (BID) | ORAL | Status: DC
  Administered 2013-02-04 – 2013-02-13 (×19): 600 mg via ORAL
  Filled 2013-02-03 (×22): qty 2

## 2013-02-03 MED ORDER — ALLOPURINOL 100 MG PO TABS
100.0000 mg | ORAL_TABLET | Freq: Two times a day (BID) | ORAL | Status: DC
  Administered 2013-02-04 – 2013-02-15 (×22): 100 mg via ORAL
  Filled 2013-02-03 (×24): qty 1

## 2013-02-03 MED ORDER — BISACODYL 10 MG RE SUPP
10.0000 mg | Freq: Every day | RECTAL | Status: DC
  Administered 2013-02-07: 10 mg via RECTAL
  Filled 2013-02-03: qty 1

## 2013-02-03 MED ORDER — HEMOSTATIC AGENTS (NO CHARGE) OPTIME
TOPICAL | Status: DC | PRN
  Administered 2013-02-03: 1 via TOPICAL

## 2013-02-03 MED ORDER — AMIODARONE HCL 200 MG PO TABS
200.0000 mg | ORAL_TABLET | Freq: Every day | ORAL | Status: DC
  Administered 2013-02-04 – 2013-02-15 (×11): 200 mg via ORAL
  Filled 2013-02-03 (×12): qty 1

## 2013-02-03 MED ORDER — FAMOTIDINE IN NACL 20-0.9 MG/50ML-% IV SOLN
20.0000 mg | Freq: Two times a day (BID) | INTRAVENOUS | Status: AC
  Administered 2013-02-03 – 2013-02-04 (×2): 20 mg via INTRAVENOUS
  Filled 2013-02-03: qty 50

## 2013-02-03 MED ORDER — ARTIFICIAL TEARS OP OINT
TOPICAL_OINTMENT | OPHTHALMIC | Status: DC | PRN
  Administered 2013-02-03: 1 via OPHTHALMIC

## 2013-02-03 MED ORDER — MAGNESIUM SULFATE 40 MG/ML IJ SOLN: 4.0000 g | Freq: Once | INTRAMUSCULAR | Status: DC

## 2013-02-03 MED ORDER — ACETAMINOPHEN 10 MG/ML IV SOLN
1000.0000 mg | Freq: Once | INTRAVENOUS | Status: AC
  Administered 2013-02-03: 1000 mg via INTRAVENOUS
  Filled 2013-02-03: qty 100

## 2013-02-03 MED ORDER — SODIUM CHLORIDE 0.9 % IV SOLN: 250.0000 mL | INTRAVENOUS | Status: DC

## 2013-02-03 MED ORDER — PROTAMINE SULFATE 10 MG/ML IV SOLN
INTRAVENOUS | Status: DC | PRN
  Administered 2013-02-03: 350 mg via INTRAVENOUS

## 2013-02-03 MED ORDER — METOPROLOL TARTRATE 12.5 MG HALF TABLET: 12.5000 mg | ORAL_TABLET | Freq: Once | ORAL | Status: DC

## 2013-02-03 MED ORDER — METOPROLOL TARTRATE 12.5 MG HALF TABLET
12.5000 mg | ORAL_TABLET | Freq: Two times a day (BID) | ORAL | Status: DC
  Administered 2013-02-04 – 2013-02-05 (×2): 12.5 mg via ORAL
  Filled 2013-02-03 (×7): qty 1

## 2013-02-03 MED ORDER — ACETAMINOPHEN 500 MG PO TABS
1000.0000 mg | ORAL_TABLET | Freq: Four times a day (QID) | ORAL | Status: AC
  Administered 2013-02-04 – 2013-02-08 (×13): 1000 mg via ORAL
  Filled 2013-02-03 (×19): qty 2

## 2013-02-03 MED FILL — Mannitol IV Soln 20%: INTRAVENOUS | Qty: 500 | Status: AC

## 2013-02-03 MED FILL — Electrolyte-R (PH 7.4) Solution: INTRAVENOUS | Qty: 3000 | Status: AC

## 2013-02-03 MED FILL — Sodium Chloride Irrigation Soln 0.9%: Qty: 3000 | Status: AC

## 2013-02-03 MED FILL — Lidocaine HCl IV Inj 20 MG/ML: INTRAVENOUS | Qty: 5 | Status: AC

## 2013-02-03 MED FILL — Heparin Sodium (Porcine) Inj 1000 Unit/ML: INTRAMUSCULAR | Qty: 10 | Status: AC

## 2013-02-03 MED FILL — Sodium Bicarbonate IV Soln 8.4%: INTRAVENOUS | Qty: 50 | Status: AC

## 2013-02-03 SURGICAL SUPPLY — 119 items
ATTRACTOMAT 16X20 MAGNETIC DRP (DRAPES) ×3 IMPLANT
BAG DECANTER FOR FLEXI CONT (MISCELLANEOUS) ×3 IMPLANT
BANDAGE ELASTIC 4 VELCRO ST LF (GAUZE/BANDAGES/DRESSINGS) ×6 IMPLANT
BANDAGE ELASTIC 6 VELCRO ST LF (GAUZE/BANDAGES/DRESSINGS) ×6 IMPLANT
BANDAGE GAUZE ELAST BULKY 4 IN (GAUZE/BANDAGES/DRESSINGS) ×6 IMPLANT
BASKET HEART (ORDER IN 25'S) (MISCELLANEOUS) ×1
BASKET HEART (ORDER IN 25S) (MISCELLANEOUS) ×2 IMPLANT
BLADE STERNUM SYSTEM 6 (BLADE) ×3 IMPLANT
CANISTER SUCTION 2500CC (MISCELLANEOUS) ×6 IMPLANT
CANNULA EZ GLIDE AORTIC 21FR (CANNULA) ×3 IMPLANT
CANNULA VENOUS LOW PROF 34X46 (CANNULA) ×3 IMPLANT
CATH CPB KIT HENDRICKSON (MISCELLANEOUS) ×3 IMPLANT
CATH ROBINSON RED A/P 18FR (CATHETERS) ×6 IMPLANT
CATH SUCT 10FR WHISTLE TIP (CATHETERS) ×3 IMPLANT
CATH THORACIC 36FR (CATHETERS) ×3 IMPLANT
CATH THORACIC 36FR RT ANG (CATHETERS) ×3 IMPLANT
CLIP FOGARTY SPRING 6M (CLIP) ×3 IMPLANT
CLIP TI MEDIUM 24 (CLIP) ×3 IMPLANT
CLIP TI WIDE RED SMALL 24 (CLIP) ×12 IMPLANT
CLOTH BEACON ORANGE TIMEOUT ST (SAFETY) ×3 IMPLANT
COVER MAYO STAND STRL (DRAPES) ×3 IMPLANT
COVER SURGICAL LIGHT HANDLE (MISCELLANEOUS) ×3 IMPLANT
CRADLE DONUT ADULT HEAD (MISCELLANEOUS) ×3 IMPLANT
DECANTER SPIKE VIAL GLASS SM (MISCELLANEOUS) IMPLANT
DRAIN HEMOVAC 1/8 X 5 (WOUND CARE) IMPLANT
DRAPE CARDIOVASCULAR INCISE (DRAPES) ×1
DRAPE INCISE IOBAN 66X45 STRL (DRAPES) ×3 IMPLANT
DRAPE SLUSH/WARMER DISC (DRAPES) ×3 IMPLANT
DRAPE SRG 135X102X78XABS (DRAPES) ×2 IMPLANT
DRAPE WARM FLUID 44X44 (DRAPE) IMPLANT
DRSG COVADERM 4X14 (GAUZE/BANDAGES/DRESSINGS) ×3 IMPLANT
DRSG COVADERM 4X6 (GAUZE/BANDAGES/DRESSINGS) ×3 IMPLANT
ELECT CAUTERY BLADE 6.4 (BLADE) ×3 IMPLANT
ELECT REM PT RETURN 9FT ADLT (ELECTROSURGICAL) ×9
ELECTRODE REM PT RTRN 9FT ADLT (ELECTROSURGICAL) ×6 IMPLANT
EVACUATOR SILICONE 100CC (DRAIN) IMPLANT
GLOVE BIO SURGEON STRL SZ 6 (GLOVE) ×12 IMPLANT
GLOVE BIO SURGEON STRL SZ 6.5 (GLOVE) ×12 IMPLANT
GLOVE BIO SURGEON STRL SZ7.5 (GLOVE) ×12 IMPLANT
GLOVE BIOGEL PI IND STRL 6.5 (GLOVE) ×12 IMPLANT
GLOVE BIOGEL PI IND STRL 7.0 (GLOVE) ×20 IMPLANT
GLOVE BIOGEL PI INDICATOR 6.5 (GLOVE) ×6
GLOVE BIOGEL PI INDICATOR 7.0 (GLOVE) ×10
GLOVE EUDERMIC 7 POWDERFREE (GLOVE) ×12 IMPLANT
GLOVE SS BIOGEL STRL SZ 7 (GLOVE) ×4 IMPLANT
GLOVE SUPERSENSE BIOGEL SZ 7 (GLOVE) ×2
GOWN PREVENTION PLUS XLARGE (GOWN DISPOSABLE) ×18 IMPLANT
GOWN STRL NON-REIN LRG LVL3 (GOWN DISPOSABLE) ×36 IMPLANT
HEMOSTAT POWDER SURGIFOAM 1G (HEMOSTASIS) ×9 IMPLANT
HEMOSTAT SURGICEL 2X14 (HEMOSTASIS) ×3 IMPLANT
INSERT FOGARTY SM (MISCELLANEOUS) ×3 IMPLANT
INSERT FOGARTY XLG (MISCELLANEOUS) IMPLANT
KIT BASIN OR (CUSTOM PROCEDURE TRAY) ×3 IMPLANT
KIT ROOM TURNOVER OR (KITS) ×3 IMPLANT
KIT SUCTION CATH 14FR (SUCTIONS) ×6 IMPLANT
KIT VASOVIEW W/TROCAR VH 2000 (KITS) ×3 IMPLANT
LOOP VESSEL MAXI BLUE (MISCELLANEOUS) ×3 IMPLANT
LOOP VESSEL MINI RED (MISCELLANEOUS) ×3 IMPLANT
MARKER GRAFT CORONARY BYPASS (MISCELLANEOUS) ×9 IMPLANT
NEEDLE 22X1 1/2 (OR ONLY) (NEEDLE) IMPLANT
NS IRRIG 1000ML POUR BTL (IV SOLUTION) ×21 IMPLANT
PACK CAROTID (CUSTOM PROCEDURE TRAY) IMPLANT
PACK OPEN HEART (CUSTOM PROCEDURE TRAY) ×3 IMPLANT
PAD ARMBOARD 7.5X6 YLW CONV (MISCELLANEOUS) ×6 IMPLANT
PAD ELECT DEFIB RADIOL ZOLL (MISCELLANEOUS) ×3 IMPLANT
PATCH HEMASHIELD 8X75 (Vascular Products) ×3 IMPLANT
PENCIL BUTTON HOLSTER BLD 10FT (ELECTRODE) ×3 IMPLANT
PIN SAFETY STERILE (MISCELLANEOUS) ×3 IMPLANT
PUNCH AORTIC ROTATE 4.0MM (MISCELLANEOUS) IMPLANT
PUNCH AORTIC ROTATE 4.5MM 8IN (MISCELLANEOUS) ×3 IMPLANT
PUNCH AORTIC ROTATE 5MM 8IN (MISCELLANEOUS) IMPLANT
SET CARDIOPLEGIA MPS 5001102 (MISCELLANEOUS) ×3 IMPLANT
SHUNT CAROTID BYPASS 12FRX15.5 (VASCULAR PRODUCTS) IMPLANT
SPONGE GAUZE 4X4 12PLY (GAUZE/BANDAGES/DRESSINGS) ×15 IMPLANT
SPONGE LAP 18X18 X RAY DECT (DISPOSABLE) ×3 IMPLANT
SPONGE LAP 4X18 X RAY DECT (DISPOSABLE) ×3 IMPLANT
SUT BONE WAX W31G (SUTURE) ×3 IMPLANT
SUT ETHILON 3 0 FSL (SUTURE) ×3 IMPLANT
SUT MNCRL AB 4-0 PS2 18 (SUTURE) IMPLANT
SUT PROLENE 3 0 SH DA (SUTURE) ×3 IMPLANT
SUT PROLENE 4 0 RB 1 (SUTURE)
SUT PROLENE 4 0 SH DA (SUTURE) IMPLANT
SUT PROLENE 4-0 RB1 .5 CRCL 36 (SUTURE) IMPLANT
SUT PROLENE 6 0 BV (SUTURE) ×6 IMPLANT
SUT PROLENE 6 0 C 1 30 (SUTURE) ×24 IMPLANT
SUT PROLENE 6 0 CC (SUTURE) ×9 IMPLANT
SUT PROLENE 7 0 BV 1 (SUTURE) ×3 IMPLANT
SUT PROLENE 7 0 BV1 MDA (SUTURE) ×6 IMPLANT
SUT PROLENE 8 0 BV175 6 (SUTURE) ×3 IMPLANT
SUT SILK  1 MH (SUTURE)
SUT SILK 1 MH (SUTURE) IMPLANT
SUT SILK 2 0 FS (SUTURE) ×3 IMPLANT
SUT SILK 3 0 (SUTURE) ×1
SUT SILK 3 0 TIES 17X18 (SUTURE)
SUT SILK 3-0 18XBRD TIE 12 (SUTURE) ×2 IMPLANT
SUT SILK 3-0 18XBRD TIE BLK (SUTURE) IMPLANT
SUT STEEL 6MS V (SUTURE) ×3 IMPLANT
SUT STEEL STERNAL CCS#1 18IN (SUTURE) IMPLANT
SUT STEEL SZ 6 DBL 3X14 BALL (SUTURE) ×3 IMPLANT
SUT VIC AB 1 CTX 36 (SUTURE) ×2
SUT VIC AB 1 CTX36XBRD ANBCTR (SUTURE) ×4 IMPLANT
SUT VIC AB 2-0 CT1 27 (SUTURE) ×3
SUT VIC AB 2-0 CT1 TAPERPNT 27 (SUTURE) ×6 IMPLANT
SUT VIC AB 2-0 CTX 27 (SUTURE) IMPLANT
SUT VIC AB 3-0 SH 27 (SUTURE)
SUT VIC AB 3-0 SH 27X BRD (SUTURE) IMPLANT
SUT VIC AB 3-0 X1 27 (SUTURE) ×15 IMPLANT
SUT VICRYL 4-0 PS2 18IN ABS (SUTURE) ×3 IMPLANT
SUTURE E-PAK OPEN HEART (SUTURE) ×3 IMPLANT
SYR CONTROL 10ML LL (SYRINGE) IMPLANT
SYSTEM SAHARA CHEST DRAIN ATS (WOUND CARE) ×3 IMPLANT
TAPE CLOTH SURG 4X10 WHT LF (GAUZE/BANDAGES/DRESSINGS) ×3 IMPLANT
TOWEL OR 17X24 6PK STRL BLUE (TOWEL DISPOSABLE) ×9 IMPLANT
TOWEL OR 17X26 10 PK STRL BLUE (TOWEL DISPOSABLE) ×12 IMPLANT
TRAY FOLEY IC TEMP SENS 14FR (CATHETERS) ×3 IMPLANT
TUBE FEEDING 8FR 16IN STR KANG (MISCELLANEOUS) ×3 IMPLANT
TUBING INSUFFLATION 10FT LAP (TUBING) ×3 IMPLANT
UNDERPAD 30X30 INCONTINENT (UNDERPADS AND DIAPERS) ×3 IMPLANT
WATER STERILE IRR 1000ML POUR (IV SOLUTION) ×9 IMPLANT

## 2013-02-03 NOTE — Op Note (Signed)
OPERATIVE REPORT  Date of Surgery: 02/03/2013  Surgeon: Josephina Gip, MD  Assistant: Lorrine Kin, Dr. Andrey Spearman  Pre-op Diagnosis: CAD-severe right carotid stenosis-asymptomatic Post-op Diagnosis: Coronary Artery Diesease; severe right carotid stenosis-asymptomatic Procedure: Procedure(s): Right carotid endarterectomy with Dacron patch angioplasty Drains one JP Anesthesia: General  EBL: 50 cc  Complications: None  Procedure Details: The patient was taken to the operating room placed in the supine position at which time satisfactory general endotracheal anesthesia was administered. Appropriate monitoring lines for coronary artery bypass grafting had been placed by anesthesia preoperatively. The neck chest abdomen and lower extremities were prepped with Betadine scrub and solution and draped in routine sterile manner. After appropriate time out the right neck was exposed and incision made along the anterior border of the sternocleidomastoid muscle. This was carried down to the common carotid artery which was dissected free. The common internal and external carotid arteries were dissected free and encircled with umbilical tapes. The internal carotid was rotated posteriorly. There was a calcified plaque at the carotid bifurcation extending up the internal carotid posteriorly about 3 cm of the distal vessel appeared normal. Facial vein had been ligated with 2-0 silk ties and divided for exposure. Care was taken not to injure the vagus or hypoglossal nerves both of which were exposed. Patient was then heparinized and the carotid vessels were occluded with vascular clamps longitudinal opening made in the common carotid 15 blade extended up the internal carotid with Potts scissors. The plaque was about 90-95% stenotic in severity. The distal vessel appeared normal and excellent backbleeding. A #10 shunt was inserted without difficulty reestablishing flow in about 2 minutes. A standard  endarterectomy was then performed using the elevator and the Potts scissors with an eversion endarterectomy of the external carotid. The plaque feathered off the distal internal carotid artery nicely not requiring any tacking sutures. The lumen was thoroughly irrigated with heparin saline and all this debris carefully removed and arterotomy closed with a patch using continuous 6-0 Prolene. Prior to completion of the closure the shunt was removed after about 30 minutes the shunt time and following antegrade and retrograde flushing the closure was completed with reestablishment of flow initially up the external then up the internal branch. Carotid was occluded for less than 2 minutes for removal of the shunt. No protamine was given at this time the wound was loosely closed with a laparotomy pad in place while Dr. Dorris Fetch performed coronary artery bypass grafting and then following that Dr. Dorris Fetch closed the neck in a standard fashion subcuticular Vicryl leaving a Jackson-Pratt drain in the depth of the wound secured with a nylon suture.  Josephina Gip, MD 02/03/2013 9:44 AM

## 2013-02-03 NOTE — Interval H&P Note (Signed)
History and Physical Interval Note:  02/03/2013 7:03 AM  Nicholas [name]  has presented today for surgery, with the diagnosis of CAD  The various methods of treatment have been discussed with the patient and family. After consideration of risks, benefits and other options for treatment, the patient has consented to  Procedure(s): CORONARY ARTERY BYPASS GRAFTING (CABG) (N/A) ENDARTERECTOMY CAROTID (N/A) as a surgical intervention .  The patient's history has been reviewed, patient examined, no change in status, stable for surgery.  I have reviewed the patient's chart and labs.  Questions were answered to the patient's satisfaction.    He was seen by Dr. Darrick Penna and determined to need a right CEA at time of CABG. Dr. Hart Rochester will perform  [name] C

## 2013-02-03 NOTE — Anesthesia Preprocedure Evaluation (Addendum)
Anesthesia Evaluation  Patient identified by MRN, date of birth, ID band Patient awake    Reviewed: Allergy & Precautions, H&P , NPO status , Patient's Chart, lab work & pertinent test results, reviewed documented beta blocker date and time   History of Anesthesia Complications Negative for: history of anesthetic complications  Airway Mallampati: II TM Distance: >3 FB Neck ROM: Full    Dental  (+) Dental Advisory Given, Poor Dentition and Chipped   Pulmonary pneumonia -, resolved, COPD COPD inhaler, former smoker,  breath sounds clear to auscultation        Cardiovascular hypertension, Pt. on medications + CAD and + Peripheral Vascular Disease + dysrhythmias Atrial Fibrillation Rhythm:Regular Rate:Normal  R ICA 80% stenosis Severe 3 vessel disease. History of 1992 MI + stents.  ASA therapy. EF 47%.  Gr 1 DD.   Neuro/Psych Anxiety Depression  Neuromuscular disease    GI/Hepatic GERD-  Medicated and Controlled,  Endo/Other  diabetes, Well Controlled, Type 2, Insulin Dependent  Renal/GU ESRF and DialysisRenal disease     Musculoskeletal  (+) Arthritis -, Osteoarthritis,    Abdominal Normal abdominal exam  (+)   Peds  Hematology negative hematology ROS (+)   Anesthesia Other Findings   Reproductive/Obstetrics                          Anesthesia Physical Anesthesia Plan  ASA: III  Anesthesia Plan: General   Post-op Pain Management:    Induction: Intravenous  Airway Management Planned: Oral ETT  Additional Equipment: Arterial line, CVP, PA Cath, TEE and Ultrasound Guidance Line Placement  Intra-op Plan:   Post-operative Plan: Post-operative intubation/ventilation  Informed Consent: I have reviewed the patients History and Physical, chart, labs and discussed the procedure including the risks, benefits and alternatives for the proposed anesthesia with the patient or authorized  representative who has indicated his/her understanding and acceptance.   Dental advisory given  Plan Discussed with: CRNA and Surgeon  Anesthesia Plan Comments:        Anesthesia Quick Evaluation

## 2013-02-03 NOTE — Brief Op Note (Addendum)
02/03/2013  12:10 PM  PATIENT:  Nicholas [name]  72 y.o. male  PRE-OPERATIVE DIAGNOSIS:  CAD  POST-OPERATIVE DIAGNOSIS:  Coronary Artery Diesease; Carotid Artery Stenosis  PROCEDURE:  Procedure(s): CORONARY ARTERY BYPASS GRAFTING (CABG)X5 LIMA-LAD; SEQ SVG-OM1-OM2; SVG-DIAG; SVG-PDA ENDARTERECTOMY CAROTID(R) WITH DPA EVH LEFT LEG  SURGEON:  Surgeon(s): Loreli Slot, MD Pryor Ochoa, MD(CAROTID)  PHYSICIAN ASSISTANT: WAYNE GOLD PA-C, SAMANTHA RHYNE PA-C(CAROTID)  ANESTHESIA:   general  PATIENT CONDITION:  ICU - intubated and hemodynamically stable.  PRE-OPERATIVE WEIGHT: 81kg  COMPLICATIONS: NO KNOWN  XC= 80 min  CPB= 126 min  Good quality targets and conduits

## 2013-02-03 NOTE — Progress Notes (Signed)
Patient ID: Nicholas [name], male   DOB: [birth year], 72 y.o.   MRN: [id number] EVENING ROUNDS NOTE :     [address]       [name] [address]             [phone]                 Day of Surgery Procedure(s) (LRB): CORONARY ARTERY BYPASS GRAFTING (CABG) (N/A) ENDARTERECTOMY CAROTID (N/A)  Total Length of Stay:  LOS: 0 days  BP 87/61  Pulse 90  Temp(Src) 97.2 F (36.2 C) (Oral)  Resp 12  Ht 5\' 2"  (1.575 m)  Wt 180 lb 12.4 oz (82 kg)  BMI 33.06 kg/m2  SpO2 100%  .Intake/Output     06/18 0701 - 06/19 0700 06/19 0701 - 06/20 0700   I.V. (mL/kg)  2636.8 (32.2)   Blood  254   NG/GT  30   IV Piggyback  650   Total Intake(mL/kg)  3570.8 (43.5)   Urine (mL/kg/hr)  260 (0.3)   Drains  15 (0)   Blood  650 (0.7)   Chest Tube  60 (0.1)   Total Output   985   Net   +2585.8          . sodium chloride 20 mL/hr at 02/03/13 1800  . sodium chloride 10 mL/hr at 02/03/13 1800  . [START ON 02/04/2013] sodium chloride    . dexmedetomidine Stopped (02/03/13 1800)  . insulin (NOVOLIN-R) infusion Stopped (02/03/13 1600)  . lactated ringers Stopped (02/03/13 1442)  . nitroGLYCERIN Stopped (02/03/13 1430)  . phenylephrine (NEO-SYNEPHRINE) Adult infusion 30 mcg/min (02/03/13 1800)     Lab Results  Component Value Date   WBC 7.7 02/01/2013   HGB 8.8* 02/03/2013   HCT 26.0* 02/03/2013   PLT 204 02/03/2013   GLUCOSE 108* 02/03/2013   ALT 9 02/01/2013   AST 17 02/01/2013   NA 137 02/03/2013   K 3.9 02/03/2013   CL 97 02/01/2013   CREATININE 3.85* 02/01/2013   BUN 31* 02/01/2013   CO2 24 02/01/2013   TSH 1.63 01/04/2013   INR 1.48 02/03/2013   HGBA1C 5.6 02/01/2013   Still sedated on vent not bleeding   Delight Ovens MD  Beeper [phone] Office [phone] 02/03/2013 6:06 PM

## 2013-02-03 NOTE — H&P (View-Only) (Signed)
PCP is KLEIN, BERT Referring Provider is Nahser, Philip J, MD  Chief Complaint  Patient presents with  . Coronary Artery Disease    surgical eval on 3V CAD, Cardiac Cath 01/11/13    HPI: Nicholas [name] sent for evaluation for coronary bypass grafting.  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 Earlier this year he was being evaluated for a right carotid endarterectomy for asymptomatic carotid stenosis (80-85% per patient report). He had a Lexiscan Myoview which showed an ejection fraction of 56% and no signs of ischemia. However before he could have the surgery done he was admitted to in March with bilateral pneumonia and respiratory failure. She also was in atrial fibrillation with a rapid ventricular response and had severe anemia from gastrointestinal bleeding. During the hospitalization he ruled in for a non-ST elevation MI.  An echocardiogram in March showed an EF of 35-40% with mitral regurgitation. A repeat echo in April showed an EF of 50% with grade 1 diastolic dysfunction, only mild mitral regurgitation. A repeat Myoview on 12/09/2012 showed an inferior defect consistent with prior MI an EF of 47%. He underwent cardiac catheterization on 5/27 which demonstrated severe three-vessel coronary disease. EF was 45-50%.  He has not had any chest pain, tightness, or pressure. He does not have any significant shortness of breath with exertion, however his physical activity is very limited. 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 He walks with a cane. In general he is very sedentary. He has not had any amaurosis, speech impairment, or unilateral neurologic signs.  Past Medical History  Diagnosis Date  . ESRD (end stage renal disease)      T, Th, Sat dialysis  . Coronary artery disease     a. hx of MI 1992; b.echo 12/02/12: Mild LVH, EF 50%, grade 1 diastolic dysfunction, mild MR, mild LAE, PASP 22.  c.  Myoview 12/09/12: Inferior defect with inferior and septal HK (favor prior MI), EF 47%  . Diabetes mellitus   . Hypertension   . Arthritis   . Carotid artery disease 09/2012    was due for R CEA in March 2014.   . Gastritis 2012 and 09/2011    treated for H Pylori in 12/2011  . Diabetic neuropathy   . Diabetic nephropathy   . Osteoporosis     Past Surgical History  Procedure Laterality Date  . Coronary stent placement    . Hip surgery      both hips  . Av fistula placement      Left arm    Family History  Problem Relation Age of Onset  . Heart attack Father 58  . Stroke Mother   . Stroke Maternal Grandmother     Social History History  Substance Use Topics  . Smoking status: Former Smoker -- 1.00 packs/day for 25 years    Types: Cigarettes    Start date: 08/18/1965    Quit date: 08/18/1990  . Smokeless tobacco: Not on file  . Alcohol Use: Yes     Comment: occasionally    Current Outpatient Prescriptions  Medication Sig Dispense Refill  . allopurinol (ZYLOPRIM) 100 MG tablet Take 100 mg by mouth daily.      . amiodarone (PACERONE) 200 MG tablet Take 200 mg by mouth daily. Until 10/31/12; then start taking 200mg by mouth daily.      .   aspirin 81 MG chewable tablet Chew 1 tablet (81 mg total) by mouth daily.      . darbepoetin (ARANESP) 60 MCG/0.3ML SOLN Inject 0.3 mLs (60 mcg total) into the vein every Friday with hemodialysis.  4.2 mL    . fish oil-omega-3 fatty acids 1000 MG capsule Take 1 g by mouth daily.      . gabapentin (NEURONTIN) 300 MG capsule Take 600 mg by mouth 2 (two) times daily.       . insulin aspart protamine-insulin aspart (NOVOLOG 70/30) (70-30) 100 UNIT/ML injection Inject 2-10 Units into the skin 3 (three) times daily as needed (Per sliding scale, based on sugar levels).      .  insulin glargine (LANTUS) 100 UNIT/ML injection Inject 15 Units into the skin at bedtime.      . lidocaine-prilocaine (EMLA) cream Apply 1 application topically as needed.       . metoprolol succinate (TOPROL-XL) 50 MG 24 hr tablet Take 50 mg in the am and 25 mg in the pm. Take with or immediately following a meal.  45 tablet  3  . Multiple Vitamins-Minerals (MULTIVITAMIN WITH MINERALS) tablet Take 1 tablet by mouth daily.      . multivitamin (RENA-VIT) TABS tablet Take 1 tablet by mouth Nightly.      . Nutritional Supplements (FEEDING SUPPLEMENT, NEPRO CARB STEADY,) LIQD Take 237 mLs by mouth 2 (two) times daily between meals.      . pantoprazole (PROTONIX) 40 MG tablet Take 40 mg by mouth daily.      . sevelamer carbonate (RENVELA) 800 MG tablet Take 800 mg by mouth 3 (three) times daily with meals.      . tamsulosin (FLOMAX) 0.4 MG CAPS Take 0.4 mg by mouth at bedtime.      . traMADol (ULTRAM) 50 MG tablet Take 50 mg by mouth every 6 (six) hours as needed for pain.       No current facility-administered medications for this visit.    Allergies  Allergen Reactions  . Statins Other (See Comments)    Muscle weakness     Review of Systems  Constitutional: Positive for activity change (decreased energy).  Eyes: Negative.   Gastrointestinal: Positive for diarrhea and constipation.  Genitourinary:       Renal failure/ dialysis MWF  Musculoskeletal: Positive for arthralgias.       Bilateral hip replacements  Skin:       itching  Neurological:       Neuropathy in feet/ legs  Hematological: Bruises/bleeds easily.  All other systems reviewed and are negative.    BP 114/71  Pulse 66  Resp 20  Ht 5' 6" (1.676 m)  Wt 175 lb (79.379 kg)  BMI 28.26 kg/m2  SpO2 98% Physical Exam  Vitals reviewed. Constitutional: He is oriented to person, place, and time. No distress.  HENT:  Head: Normocephalic and atraumatic.  Eyes: EOM are normal. Pupils are equal, round, and reactive to light.   Neck: Neck supple. No thyromegaly present.  No audible bruits  Cardiovascular: Normal rate, regular rhythm and normal heart sounds.  Exam reveals no gallop and no friction rub.   No murmur heard. Unable to palpate DP/PT  Pulmonary/Chest: Effort normal and breath sounds normal. He has no wheezes. He has no rales.  Abdominal: Soft. There is no tenderness.  Musculoskeletal: Normal range of motion. He exhibits edema (1+).  Lymphadenopathy:    He has no cervical adenopathy.  Neurological: He is alert and oriented to person,   place, and time. No cranial nerve deficit.  Skin: Skin is warm and dry.     Diagnostic Tests:  ECHOCARDIOGRAM 12/02/12  Study Conclusions  - Left ventricle: The cavity size was normal. Wall thickness was increased in a pattern of mild LVH. The estimated ejection fraction was 50%. Mild global hypokinesis. Doppler parameters are consistent with abnormal left ventricular relaxation (grade 1 diastolic dysfunction). - Aortic valve: There was no stenosis. - Mitral valve: Mild regurgitation. - Left atrium: The atrium was mildly dilated. - Right ventricle: The cavity size was normal. Systolic function was normal. - Tricuspid valve: Peak RV-RA gradient: 17mm Hg (S). - Pulmonary arteries: PA peak pressure: 22mm Hg (S). - Inferior vena cava: The vessel was normal in size; the respirophasic diameter changes were in the normal range (= 50%); findings are consistent with normal central venous pressure. Impressions:  - Normal LV size with mild LV hypertrophy. EF 50% with mild global hypokinesis. Normal RV size and systolic function. Mild MR.     CARDIAC CATHETERIZATION 01/11/13  Hemodynamic Findings:  Central aortic pressure: 161/72  Left ventricular pressure: 155/8/13  Angiographic Findings:  Left main: No obstructive disease.  Left Anterior Descending Artery: Moderate to large caliber vessel that courses to the apex. The proximal vessel has mild disease. The mid  vessel has diffuse calcific 70% stenosis followed by tandem 95% stenoses. The distal vessel has a discreet 80% stenosis. The diagonal branch is moderate in caliber with a 99% proximal stenosis.  Circumflex Artery: Moderate caliber vessel with 60-70% mid stenosis just before the takeoff of two marginal branches. The first obtuse marginal branch is small to moderate in caliber with proximal 99% stenosis. The second obtuse marginal branch has mild plaque. The continuation of the AV groove Circumflex is small in caliber with mild plaque disease.  Right Coronary Artery: Large caliber, dominant vessel with 40% proximal stenosis, 99% mid stenosis followed by 100% mid occlusion. There is faint filling of the distal vessel via antegrade injection. The PDA and PLA is also seen to fill by left to right collaterals.  Left Ventricular Angiogram: LVEF= 45-50%.  Impression:  1. Severe triple vessel CAD  2. Ischemic cardiomyopathy  Recommendations: He has diffuse 3 vessel CAD. Will need referral to CT surgery for possible CABG. Will discuss with Dr. Nahser in regards to timing.   Impression: Nicholas [name] with multiple major medical problems including complicated diabetes with neuropathy and nephropathy. He also has end-stage renal disease and is on hemodialysis. He was hospitalized a few months ago with bilateral pneumonia and respiratory failure. Around that time he also has a severe GI bleed and severe anemia with a hemoglobin of 6.5. He had atrial fibrillation during that admission his been in sinus rhythm since then. He is on amiodarone. He has not on Coumadin due to his GI bleed history. He is asymptomatic from a cardiac standpoint. However cardiac catheterization does reveal severe three-vessel coronary disease and he would benefit from coronary bypass grafting for survival benefit.  The primary issue in this case is timing of coronary bypass grafting and right carotid endarterectomy. According to the  patient and his brother is been over 6 months since he had his carotid duplex. Her first up is to repeat the carotid duplex to see just how severe his carotid disease is. If it is in fact an 80-85% range then I would recommend coronary bypass grafting first. If it is significantly tighter than that we will have to consider having his carotid endarterectomy done   first or possibly a combined procedure.  I had a long discussion with the patient and his brother regarding coronary bypass grafting. They understand this is for survival benefit. He's not having any anginal symptoms so I don't think there is any symptomatic benefit to be gained. We discussed the general nature of the procedure, use of general anesthesia, incisions to be used, expected hospital stay, and overall recovery. He will likely need SNF placement postoperatively due to his multiple issues and limited support.  I discussed in detail with them the indications, risks, benefits, and alternatives. They understand the risk include but are not limited to death, stroke, MI, DVT, PE, bleeding, possible need for transfusion, infection, cardiac arrhythmias including atrial fibrillation, respiratory failure, gastrointestinal complications, and the possibility of unforeseeable complications. They do understand that he is a high risk patient given his multiple medical problems and dialysis dependence. He does wish to proceed with surgery  Plan: Obtain repeat carotid duplex  Once duplex results are known we can formulate a plan 

## 2013-02-03 NOTE — Transfer of Care (Signed)
Immediate Anesthesia Transfer of Care Note  Patient: Nicholas [name]  Procedure(s) Performed: Procedure(s): CORONARY ARTERY BYPASS GRAFTING (CABG) (N/A) ENDARTERECTOMY CAROTID (N/A)  Patient Location: SICU  Anesthesia Type:General  Level of Consciousness: sedated and unresponsive  Airway & Oxygen Therapy: Pt with controlled ventilations; see RT flowsheet for ventilator settings.  Post-op Assessment: Report given to PACU RN and Post -op Vital signs reviewed and stable  Post vital signs: Reviewed and stable  Complications: No apparent anesthesia complications

## 2013-02-03 NOTE — Preoperative (Signed)
Beta Blockers   Reason not to administer Beta Blockers:Not Applicable  Pt took am metoprolol. 

## 2013-02-03 NOTE — Anesthesia Postprocedure Evaluation (Signed)
Anesthesia Post Note  Patient: Nicholas [name]  Procedure(s) Performed: Procedure(s) (LRB): CORONARY ARTERY BYPASS GRAFTING (CABG) (N/A) ENDARTERECTOMY CAROTID (N/A)  Anesthesia type: General  Patient location: ICU  Post pain: Pain level controlled  Post assessment: Post-op Vital signs reviewed  Last Vitals:  Filed Vitals:   02/03/13 1730  BP: 86/64  Pulse: 90  Temp: 36.1 C  Resp: 12    Post vital signs: stable  Level of consciousness: Patient remains intubated per anesthesia plan  Complications: No apparent anesthesia complications

## 2013-02-03 NOTE — OR Nursing (Signed)
Procedures as follows:  Right Carotid [phone]   Legs [id number]  CABG started at 0945

## 2013-02-04 ENCOUNTER — Inpatient Hospital Stay (HOSPITAL_COMMUNITY): Payer: Medicare Other

## 2013-02-04 ENCOUNTER — Encounter (HOSPITAL_COMMUNITY): Payer: Self-pay | Admitting: Thoracic Surgery (Cardiothoracic Vascular Surgery)

## 2013-02-04 LAB — POCT I-STAT, CHEM 8
Calcium, Ion: 1.16 mmol/L (ref 1.13–1.30)
Creatinine, Ser: 4 mg/dL — ABNORMAL HIGH (ref 0.50–1.35)
Glucose, Bld: 90 mg/dL (ref 70–99)
HCT: 24 % — ABNORMAL LOW (ref 39.0–52.0)
Hemoglobin: 8.2 g/dL — ABNORMAL LOW (ref 13.0–17.0)
Potassium: 4.3 mEq/L (ref 3.5–5.1)
TCO2: 23 mmol/L (ref 0–100)

## 2013-02-04 LAB — POCT I-STAT 3, ART BLOOD GAS (G3+)
Acid-base deficit: 2 mmol/L (ref 0.0–2.0)
Acid-base deficit: 3 mmol/L — ABNORMAL HIGH (ref 0.0–2.0)
Bicarbonate: 21.6 mEq/L (ref 20.0–24.0)
Bicarbonate: 20.4 mEq/L (ref 20.0–24.0)
O2 Saturation: 99 %
O2 Saturation: 99 %
Patient temperature: 37.1
Patient temperature: 36.9
Patient temperature: 36.9
TCO2: 22 mmol/L (ref 0–100)
pCO2 arterial: 28.8 mmHg — ABNORMAL LOW (ref 35.0–45.0)
pH, Arterial: 7.49 — ABNORMAL HIGH (ref 7.350–7.450)
pH, Arterial: 7.461 — ABNORMAL HIGH (ref 7.350–7.450)
pO2, Arterial: 113 mmHg — ABNORMAL HIGH (ref 80.0–100.0)
pO2, Arterial: 91 mmHg (ref 80.0–100.0)

## 2013-02-04 LAB — GLUCOSE, CAPILLARY
Glucose-Capillary: 104 mg/dL — ABNORMAL HIGH (ref 70–99)
Glucose-Capillary: 109 mg/dL — ABNORMAL HIGH (ref 70–99)
Glucose-Capillary: 111 mg/dL — ABNORMAL HIGH (ref 70–99)
Glucose-Capillary: 112 mg/dL — ABNORMAL HIGH (ref 70–99)
Glucose-Capillary: 112 mg/dL — ABNORMAL HIGH (ref 70–99)
Glucose-Capillary: 117 mg/dL — ABNORMAL HIGH (ref 70–99)
Glucose-Capillary: 127 mg/dL — ABNORMAL HIGH (ref 70–99)
Glucose-Capillary: 146 mg/dL — ABNORMAL HIGH (ref 70–99)
Glucose-Capillary: 154 mg/dL — ABNORMAL HIGH (ref 70–99)
Glucose-Capillary: 83 mg/dL (ref 70–99)
Glucose-Capillary: 94 mg/dL (ref 70–99)

## 2013-02-04 LAB — CBC
Hemoglobin: 7.9 g/dL — ABNORMAL LOW (ref 13.0–17.0)
MCH: 31.6 pg (ref 26.0–34.0)
MCH: 31.1 pg (ref 26.0–34.0)
MCHC: 32.9 g/dL (ref 30.0–36.0)
MCV: 95.9 fL (ref 78.0–100.0)
Platelets: 146 10*3/uL — ABNORMAL LOW (ref 150–400)
Platelets: 193 10*3/uL (ref 150–400)
Platelets: 195 10*3/uL (ref 150–400)
RBC: 2.54 MIL/uL — ABNORMAL LOW (ref 4.22–5.81)
RDW: 17 % — ABNORMAL HIGH (ref 11.5–15.5)
WBC: 10.9 10*3/uL — ABNORMAL HIGH (ref 4.0–10.5)
WBC: 11 10*3/uL — ABNORMAL HIGH (ref 4.0–10.5)

## 2013-02-04 LAB — BASIC METABOLIC PANEL
Calcium: 8 mg/dL — ABNORMAL LOW (ref 8.4–10.5)
Creatinine, Ser: 3.45 mg/dL — ABNORMAL HIGH (ref 0.50–1.35)
GFR calc non Af Amer: 17 mL/min — ABNORMAL LOW (ref >90)
Glucose, Bld: 111 mg/dL — ABNORMAL HIGH (ref 70–99)
Sodium: 134 mEq/L — ABNORMAL LOW (ref 135–145)

## 2013-02-04 LAB — POTASSIUM: Potassium: 4.1 mEq/L (ref 3.5–5.1)

## 2013-02-04 LAB — CREATININE, SERUM
Creatinine, Ser: 3.41 mg/dL — ABNORMAL HIGH (ref 0.50–1.35)
Creatinine, Ser: 4.18 mg/dL — ABNORMAL HIGH (ref 0.50–1.35)
GFR calc Af Amer: 15 mL/min — ABNORMAL LOW (ref >90)
GFR calc non Af Amer: 13 mL/min — ABNORMAL LOW (ref >90)
GFR calc non Af Amer: 17 mL/min — ABNORMAL LOW (ref >90)

## 2013-02-04 MED ORDER — ENOXAPARIN SODIUM 30 MG/0.3ML ~~LOC~~ SOLN
30.0000 mg | Freq: Every day | SUBCUTANEOUS | Status: DC
  Administered 2013-02-04 – 2013-02-14 (×11): 30 mg via SUBCUTANEOUS
  Filled 2013-02-04 (×13): qty 0.3

## 2013-02-04 MED ORDER — INSULIN ASPART 100 UNIT/ML ~~LOC~~ SOLN: 0.0000 [IU] | SUBCUTANEOUS | Status: DC

## 2013-02-04 MED ORDER — ALBUMIN HUMAN 5 % IV SOLN
12.5000 g | Freq: Once | INTRAVENOUS | Status: AC
  Administered 2013-02-04: 12.5 g via INTRAVENOUS

## 2013-02-04 MED ORDER — INSULIN ASPART 100 UNIT/ML ~~LOC~~ SOLN
0.0000 [IU] | Freq: Once | SUBCUTANEOUS | Status: AC
  Administered 2013-02-15: 1 [IU] via SUBCUTANEOUS

## 2013-02-04 MED ORDER — INSULIN ASPART 100 UNIT/ML ~~LOC~~ SOLN
3.0000 [IU] | Freq: Three times a day (TID) | SUBCUTANEOUS | Status: DC
  Administered 2013-02-10 – 2013-02-13 (×4): 3 [IU] via SUBCUTANEOUS

## 2013-02-04 MED ORDER — INSULIN DETEMIR 100 UNIT/ML ~~LOC~~ SOLN
20.0000 [IU] | Freq: Every day | SUBCUTANEOUS | Status: DC
  Administered 2013-02-04 – 2013-02-15 (×10): 20 [IU] via SUBCUTANEOUS
  Filled 2013-02-04 (×12): qty 0.2

## 2013-02-04 MED FILL — Heparin Sodium (Porcine) Inj 1000 Unit/ML: INTRAMUSCULAR | Qty: 30 | Status: AC

## 2013-02-04 MED FILL — Potassium Chloride Inj 2 mEq/ML: INTRAVENOUS | Qty: 40 | Status: AC

## 2013-02-04 MED FILL — Magnesium Sulfate Inj 50%: INTRAMUSCULAR | Qty: 10 | Status: AC

## 2013-02-04 MED FILL — Sodium Chloride IV Soln 0.9%: INTRAVENOUS | Qty: 1000 | Status: AC

## 2013-02-04 NOTE — Progress Notes (Signed)
1 Day Post-Op Procedure(s) (LRB): CORONARY ARTERY BYPASS GRAFTING (CABG) (N/A) ENDARTERECTOMY CAROTID (N/A) Subjective: Intubated Awake, ETT irritating him  Objective: Vital signs in last 24 hours: Temp:  [96.6 F (35.9 C)-99.1 F (37.3 C)] 98.8 F (37.1 C) (06/20 0734) Pulse Rate:  [90] 90 (06/20 0734) Cardiac Rhythm:  [-] A-V Sequential paced (06/20 0400) Resp:  [0-23] 22 (06/20 0734) BP: (78-128)/(45-85) 96/45 mmHg (06/20 0734) SpO2:  [100 %] 100 % (06/20 0734) Arterial Line BP: (84-162)/(43-81) 118/54 mmHg (06/20 0700) FiO2 (%):  [39.8 %-50.6 %] 40 % (06/20 0734) Weight:  [180 lb 12.4 oz (82 kg)] 180 lb 12.4 oz (82 kg) (06/19 1430)  Hemodynamic parameters for last 24 hours: PAP: (14-31)/(7-21) 26/16 mmHg CO:  [3.6 L/min-5.1 L/min] 5.1 L/min CI:  [2 L/min/m2-2.7 L/min/m2] 2.7 L/min/m2  Intake/Output from previous day: 06/19 0701 - 06/20 0700 In: 5219.7 [I.V.:3625.7; Blood:254; NG/GT:140; IV Piggyback:1200] Out: 1235 [Urine:395; Drains:30; Blood:650; Chest Tube:160] Intake/Output this shift:    General appearance: alert and cooperative Neurologic: generally weak but equal bilaterally Heart: regular rate and rhythm Lungs: diminished breath sounds bibasilar Abdomen: normal findings: soft, non-tender  Lab Results:  Recent Labs  02/03/13 2330 02/04/13 0430  WBC 10.9* 8.9  HGB 9.6* 8.5*  HCT 29.5* 25.8*  PLT 193 195   BMET:  Recent Labs  02/01/13 1549  02/03/13 1428 02/03/13 2330 02/04/13 0030 02/04/13 0430  NA 139  < > 137  --   --  134*  K 3.3*  < > 3.9  --  4.1 4.1  CL 97  --   --   --   --  99  CO2 24  --   --   --   --  21  GLUCOSE 169*  < > 108*  --   --  111*  BUN 31*  --   --   --   --  26*  CREATININE 3.85*  --   --  3.41*  --  3.45*  CALCIUM 8.5  --   --   --   --  8.0*  < > = values in this interval not displayed.  PT/INR:  Recent Labs  02/03/13 1430  LABPROT 17.5*  INR 1.48   ABG    Component Value Date/Time   PHART 7.470*  02/04/2013 0526   HCO3 21.6 02/04/2013 0526   TCO2 22 02/04/2013 0526   ACIDBASEDEF 2.0 02/04/2013 0526   O2SAT 99.0 02/04/2013 0526   CBG (last 3)   Recent Labs  02/03/13 2158 02/03/13 2251 02/03/13 2357  GLUCAP 189* 162* 146*    Assessment/Plan: S/P Procedure(s) (LRB): CORONARY ARTERY BYPASS GRAFTING (CABG) (N/A) ENDARTERECTOMY CAROTID (N/A) See progression orders  CV- still on phenylephrine gtt for low BP, good index  Filling pressures low - will give albumin this AM, wean neo  Dc swan  RESP- still intubated. CXR shows some edema, but gas exchange is good  Failed weaning parameters- extubated when he meets parameters  RENAL- ESRD on HD  Lytes OK  ENDO- CBG OK on insulin gtt, transition to levimir + SSI  Minimal CT output- dc  OOB once extubated    LOS: 1 day    Nicholas [name] C 02/04/2013

## 2013-02-04 NOTE — Progress Notes (Signed)
Pt failed weaning parameters, see respiratory flow sheet for further documentation.  Will attempt again in 1 hour as per protocol.

## 2013-02-04 NOTE — Progress Notes (Signed)
Pt mouth breathing, desat on [place] 88% on 4 lpm.  Placed pt on 35% VM sats 91-94%, no distress noted.

## 2013-02-04 NOTE — Op Note (Signed)
NAMECRISTOVAL, Nicholas [name] NO.:  [id number]  MEDICAL RECORD NO.:  [id number]  LOCATION:  2305                         FACILITY:  [facility]  PHYSICIAN:  Salvatore Decent. Dorris Fetch, M.D.DATE OF BIRTH:  1940-08-30  DATE OF PROCEDURE:  02/03/2013 DATE OF DISCHARGE:                              OPERATIVE REPORT   PREOPERATIVE DIAGNOSIS:  Severe three-vessel coronary artery disease.  POSTOPERATIVE DIAGNOSIS:  Severe three-vessel coronary artery disease.  PROCEDURES:  Median sternotomy, extracorporeal circulation, coronary artery bypass grafting x5 (left internal mammary artery to the left anterior descending, saphenous vein graft to first diagonal, sequential saphenous vein graft to obtuse marginals 1 and 2, saphenous vein graft to posterior descending), endoscopic vein harvest, left leg.  SURGEON:  Salvatore Decent. Dorris Fetch, MD  ASSISTANT:  Rowe Clack, PA-C  ANESTHESIA:  General.  FINDINGS:  Good quality conduits, good quality targets.  CLINICAL NOTE:  Nicholas [name] is a 72 year old gentleman who recently has been diagnosed with 3-vessel coronary artery disease and was referred for coronary artery bypass grafting.  He was advised to have bypass grafting for survival benefit.  The indications, risks, benefits, and alternatives were discussed in detail with the patient.  He had a known right carotid stenosis and preoperative duplex examination revealed that this was severe and warranted endarterectomy at the time of coronary bypass grafting to reduce his stroke risk.  The patient agreed to have combined carotid endarterectomy and coronary artery bypass grafting with Dr. Hart Rochester to perform the endarterectomy.  OPERATIVE NOTE:  Nicholas [name] was brought to the preoperative holding area on February 03, 2013, there Anesthesia placed a Swan-Ganz catheter and arterial blood pressure monitoring line.  Intravenous antibiotics were administered.  He was taken to the operating room.  The neck,  chest, abdomen, and legs were prepped and draped in usual sterile fashion.  Dr. Josephina Gip then performed a right carotid endarterectomy.  Please refer to his separately dictated note for details of the procedure.  After completion of the endarterectomy, an incision was made in the medial aspect of the right leg.  Greater saphenous vein was identified, but was relatively small.  An incision was made in the medial aspect of the left leg at the level of the knee.  The greater saphenous vein was identified there and was of normal caliber and good quality vessel.  It was harvested from calf to groin endoscopically and was a good quality conduit.  A median sternotomy was performed and the left internal mammary artery was harvested using standard technique.  This was difficult due to the sternal deformity, but the mammary artery was a good quality vessel.  After harvesting the conduits, the full heparin dose was given.  The pericardium was opened.  There were some mild intrapericardial adhesions.  These were taken down with the sharp dissection.  The ascending aorta was inspected.  There was no evidence of atherosclerotic disease.  After confirming adequate anticoagulation with ACT measurement, the aorta was cannulated via concentric 2-0 Ethibond pledgeted pursestring sutures.  A dual-stage venous cannula was placed via pursestring suture in the right atrial appendage. Cardiopulmonary bypass was instituted and the patient was cooled to 32 degrees Celsius.  The  coronary arteries were inspected and anastomotic sites were chosen.  The conduits were inspected and cut to length.  A foam pad was placed in the pericardium to insulate the heart and protect the left phrenic nerve.  A temperature probe was placed in the myocardial septum and a cardioplegic cannula was placed in the ascending aorta.  The aorta was crossclamped.  The left ventricle was emptied via the aortic root vent.  Cardiac  arrest then was achieved with a combination of cold antegrade blood cardioplegia and topical iced saline.  1.5 L of cardioplegia was administered.  The myocardial septal temperature fell to 10 degrees Celsius.  There was a rapid diastolic arrest.  The following distal anastomoses were performed.  First, a reversed saphenous vein graft was placed end-to-side to the posterior descending branch of the right coronary.  This was a 1.5-mm target vessel, it was of good quality.  The vein was good quality, and an end-to-side anastomosis was performed with a running 7-0 Prolene suture.  At the completion of each anastomosis, a probe was passed proximally and distally to ensure patency prior to tying the suture. Cardioplegia was administered down each vein graft at its completion to assess flow and hemostasis.  Next, a reversed saphenous vein graft was placed end-to-side to the first diagonal branch to the LAD.  This was a 1.5-mm good quality target.  The end-to-side anastomosis was performed with running 7-0 Prolene suture. Additional cardioplegia was administered down the 2 vein grafts as well as the aortic root.    Next, the heart was retracted to the right, exposing the lateral wall.  OM-1 and OM-2 were identified and were in close proximity, a side-to-side anastomosis was performed to OM-1 and end-to-side to OM-2.  Both vessels did accept a 1.5-mm probe.  Again, the vein graft was of good quality as were the targets.  Additional cardioplegia was administered.  Next, the left internal mammary artery was brought through a window in the pericardium.  The distal end was beveled.  It was then anastomosed end-to-side to the distal LAD.  The LAD was a 2-mm good quality target.  The mammary was a 1.5-mm good quality conduit.  An end-to-side anastomosis was performed with a running 8-0 Prolene suture.  At the completion of the mammary to LAD anastomosis, the bulldog clamp was briefly removed to  inspect for hemostasis.  Immediate and rapid septal rewarming was noted.  The bulldog clamp was replaced and the mammary pedicle was tacked to the epicardial surface of the heart with 6-0 Prolene sutures.  Again, additional cardioplegia was administered.  The vein grafts were cut to length.  The cardioplegic cannula was removed from the ascending aorta and the proximal vein graft anastomoses were performed to 4.5-mm punch aortotomies with running 6-0 Prolene sutures.  At the completion of final proximal anastomosis, the patient was placed in Trendelenburg position.  Lidocaine was administered.  The bulldog clamp was removed from the left mammary artery.  The aortic root was de-aired and the aortic crossclamp was removed.  The total crossclamp time was 80 minutes.  The patient spontaneously resumed sinus rhythm.  While rewarming was completed, all proximal and distal anastomoses were inspected for hemostasis.  Epicardial pacing wires were placed on the right ventricle and right atrium and DDD pacing was initiated for rate.  When the patient had rewarmed to a core temperature of 37 degrees Celsius, he was weaned from cardiopulmonary bypass without inotropic support.  Total bypass time was 126 minutes.  The initial cardiac index was 2 liters/minute/m2.  A test dose of protamine was administered.  There was transient hypotension which responded well to corrective measures.  The atrial and aortic cannulae were removed.  The remainder of protamine was administered without incident.  Chest was irrigated with warm saline. Hemostasis was achieved.  The pericardium was reapproximated with interrupted 3-0 silk sutures.  The left pleural and mediastinal chest tubes were placed through separate subcostal incisions.  The sternum was closed with interrupted heavy gauge stainless steel wires.  The pectoralis fascia, subcutaneous tissue, and skin were closed in standard fashion.  All sponge, needle,  and instrument counts were correct at the end of the procedure.  The patient was taken from the operating room to the postanesthetic care unit in good condition.     Salvatore Decent Dorris Fetch, M.D.     SCH/MEDQ  D:  02/03/2013  T:  02/04/2013  Job:  [id number]

## 2013-02-04 NOTE — Progress Notes (Signed)
PT fail his parameters. PT put back on full support

## 2013-02-04 NOTE — Procedures (Signed)
Extubation Procedure Note  Patient Details:   Name: Nicholas [name] DOB: 10-Oct-1940 MRN: [id number]   Airway Documentation:     Evaluation  O2 sats: stable throughout Complications: No apparent complications Patient did tolerate procedure well. Bilateral Breath Sounds: Clear;Diminished Suctioning: Airway Yes, pt able to speak.  + cuff leak.  Nif -20, VC 750 prior to extubation.    No distress noted, sats 93-96 on 4 lpm [place].  No stridor noted.   Jennette Kettle 02/04/2013, 1:34 PM

## 2013-02-04 NOTE — Progress Notes (Signed)
NIF -16 to -18, VC remains 700 ml.

## 2013-02-04 NOTE — Progress Notes (Signed)
Nursing  Pt able to open eyes spontaneously, follow all commands and able to breathe over vent without stimulation.  Wean attempted and ABG WNL.  Failed parameters x 3 with no respiratory effort on VC and NIF.  Will return pt to SIMV 40/12/5 and reassess.  Pt nods appropriately and is calm and resting.    L Josiel Gahm RN

## 2013-02-04 NOTE — Progress Notes (Addendum)
VASCULAR AND VEIN SURGERY POST - OP CEA PROGRESS NOTE  Date of Surgery: 02/03/2013  Surgeon(s): Loreli Slot, MD Pryor Ochoa, MD 1 Day Post-Op right CEA .  HPI: Nicholas [name] is a 72 y.o. male who is 1 Day Post-Op . Patient is intubated. Awake following commands. Minimal drainage from JP drain right neck.  Significant Diagnostic Studies: CBC Lab Results  Component Value Date   WBC 8.9 02/04/2013   HGB 8.5* 02/04/2013   HCT 25.8* 02/04/2013   MCV 95.9 02/04/2013   PLT 195 02/04/2013    BMET    Component Value Date/Time   NA 134* 02/04/2013 0430   K 4.1 02/04/2013 0430   CL 99 02/04/2013 0430   CO2 21 02/04/2013 0430   GLUCOSE 111* 02/04/2013 0430   BUN 26* 02/04/2013 0430   CREATININE 3.45* 02/04/2013 0430   CALCIUM 8.0* 02/04/2013 0430   GFRNONAA 17* 02/04/2013 0430   GFRAA 19* 02/04/2013 0430    COAG Lab Results  Component Value Date   INR 1.48 02/03/2013   INR 1.04 02/01/2013   INR 1.1* 01/04/2013   No results found for this basename: PTT      Intake/Output Summary (Last 24 hours) at 02/04/13 0748 Last data filed at 02/04/13 0600  Gross per 24 hour  Intake 5219.65 ml  Output   1235 ml  Net 3984.65 ml    Physical Exam:  BP Readings from Last 3 Encounters:  02/04/13 96/45  02/04/13 96/45  02/01/13 108/67   Temp Readings from Last 3 Encounters:  02/04/13 98.8 F (37.1 C)   02/04/13 98.8 F (37.1 C)   02/01/13 97.5 F (36.4 C)    SpO2 Readings from Last 3 Encounters:  02/04/13 100%  02/04/13 100%  02/01/13 95%   Pulse Readings from Last 3 Encounters:  02/04/13 90  02/03/13 61  02/04/13 90    Pt is Awake, agitated but following commands right Neck Wound is clean, dry, intact Pt has good and equal strength in all extremities  Assessment/Plan:: Nicholas [name] is a 72 y.o. male is S/P Right CEA with CABG Pt is intubated but awake and following commands Will remove JP drain and dressing right neck   ROCZNIAK,REGINA J  02/04/2013 7:48  AM  Addendum  I have independently interviewed and examined the patient, and I agree with the physician assistant's findings.    Leonides Sake, MD [facility] Office: [phone] Pager: [phone]

## 2013-02-04 NOTE — Progress Notes (Signed)
POD # 1 CABG  Extubated earlier today  Still a little lethargic, but easily arousable  BP 94/42  Pulse 81  Temp(Src) 97.4 F (36.3 C) (Oral)  Resp 16  Ht 5\' 2"  (1.575 m)  Wt 180 lb 12.4 oz (82 kg)  BMI 33.06 kg/m2  SpO2 91%   Intake/Output Summary (Last 24 hours) at 02/04/13 1811 Last data filed at 02/04/13 1500  Gross per 24 hour  Intake 2523.14 ml  Output    300 ml  Net 2223.14 ml   K 4.3, Na 137  Will likely need dialysis tomorrow

## 2013-02-04 NOTE — Progress Notes (Signed)
Dr. Dorris Fetch notified of NIF and VC parameters.  Per MD, leave pt on wean mode, and reassess parameters in approx 1 hour.

## 2013-02-05 ENCOUNTER — Inpatient Hospital Stay (HOSPITAL_COMMUNITY): Payer: Medicare Other

## 2013-02-05 LAB — HEPATITIS B SURFACE ANTIGEN: Hepatitis B Surface Ag: NEGATIVE

## 2013-02-05 LAB — TYPE AND SCREEN
ABO/RH(D): A POS
Antibody Screen: NEGATIVE
Unit division: 0
Unit division: 0
Unit division: 0

## 2013-02-05 LAB — CBC
HCT: 23.7 % — ABNORMAL LOW (ref 39.0–52.0)
Hemoglobin: 7.4 g/dL — ABNORMAL LOW (ref 13.0–17.0)
MCV: 99.2 fL (ref 78.0–100.0)
Platelets: 172 10*3/uL (ref 150–400)
RBC: 2.39 MIL/uL — ABNORMAL LOW (ref 4.22–5.81)
WBC: 8.6 10*3/uL (ref 4.0–10.5)

## 2013-02-05 LAB — GLUCOSE, CAPILLARY
Glucose-Capillary: 117 mg/dL — ABNORMAL HIGH (ref 70–99)
Glucose-Capillary: 117 mg/dL — ABNORMAL HIGH (ref 70–99)
Glucose-Capillary: 103 mg/dL — ABNORMAL HIGH (ref 70–99)

## 2013-02-05 LAB — BASIC METABOLIC PANEL
BUN: 32 mg/dL — ABNORMAL HIGH (ref 6–23)
CO2: 22 mEq/L (ref 19–32)
Calcium: 8.6 mg/dL (ref 8.4–10.5)
Creatinine, Ser: 4.26 mg/dL — ABNORMAL HIGH (ref 0.50–1.35)
GFR calc non Af Amer: 13 mL/min — ABNORMAL LOW (ref >90)
Glucose, Bld: 125 mg/dL — ABNORMAL HIGH (ref 70–99)

## 2013-02-05 LAB — URINE CULTURE

## 2013-02-05 MED ORDER — METOCLOPRAMIDE HCL 5 MG/ML IJ SOLN
5.0000 mg | Freq: Four times a day (QID) | INTRAMUSCULAR | Status: AC
  Administered 2013-02-05 – 2013-02-06 (×4): 5 mg via INTRAVENOUS
  Filled 2013-02-05 (×5): qty 1

## 2013-02-05 MED ORDER — NEPRO/CARBSTEADY PO LIQD
237.0000 mL | ORAL | Status: DC | PRN
  Filled 2013-02-05: qty 237

## 2013-02-05 MED ORDER — HEPARIN SODIUM (PORCINE) 1000 UNIT/ML DIALYSIS: 1000.0000 [IU] | INTRAMUSCULAR | Status: DC | PRN

## 2013-02-05 MED ORDER — INSULIN ASPART 100 UNIT/ML ~~LOC~~ SOLN
0.0000 [IU] | Freq: Three times a day (TID) | SUBCUTANEOUS | Status: DC
  Administered 2013-02-08: 1 [IU] via SUBCUTANEOUS
  Administered 2013-02-08: 2 [IU] via SUBCUTANEOUS
  Administered 2013-02-09 – 2013-02-10 (×4): 1 [IU] via SUBCUTANEOUS
  Administered 2013-02-13: 2 [IU] via SUBCUTANEOUS
  Administered 2013-02-15: 1 [IU] via SUBCUTANEOUS

## 2013-02-05 MED ORDER — ALTEPLASE 2 MG IJ SOLR
2.0000 mg | Freq: Once | INTRAMUSCULAR | Status: AC | PRN
  Filled 2013-02-05: qty 2

## 2013-02-05 MED ORDER — SODIUM CHLORIDE 0.9 % IV SOLN: 100.0000 mL | INTRAVENOUS | Status: DC | PRN

## 2013-02-05 MED ORDER — PENTAFLUOROPROP-TETRAFLUOROETH EX AERO: 1.0000 "application " | INHALATION_SPRAY | CUTANEOUS | Status: DC | PRN

## 2013-02-05 MED ORDER — LIDOCAINE-PRILOCAINE 2.5-2.5 % EX CREA
1.0000 "application " | TOPICAL_CREAM | CUTANEOUS | Status: DC | PRN
  Administered 2013-02-05: 1 via TOPICAL
  Filled 2013-02-05: qty 5

## 2013-02-05 MED ORDER — ALBUMIN HUMAN 25 % IV SOLN
INTRAVENOUS | Status: AC
  Filled 2013-02-05: qty 100

## 2013-02-05 MED ORDER — LIDOCAINE HCL (PF) 1 % IJ SOLN: 5.0000 mL | INTRAMUSCULAR | Status: DC | PRN

## 2013-02-05 MED ORDER — ALBUMIN HUMAN 25 % IV SOLN
INTRAVENOUS | Status: AC
  Administered 2013-02-05: 25 g
  Filled 2013-02-05: qty 100

## 2013-02-05 NOTE — Progress Notes (Signed)
After attempting clear liquids for breakfast, Nicholas [name] is now complaining of nausea. He feels that dialysis will help the nausea and is anxious to get it started this morning. Reglan being sent from pharmacy and will begin the course of therapy ordered shortly. Will continue to monitor.

## 2013-02-05 NOTE — Progress Notes (Signed)
.  on HD  BP 98/63  Pulse 86  Temp(Src) 97.5 F (36.4 C) (Oral)  Resp 13  Ht 5\' 2"  (1.575 m)  Wt 197 lb 15.6 oz (89.8 kg)  BMI 36.2 kg/m2  SpO2 89%   Intake/Output Summary (Last 24 hours) at 02/05/13 1648 Last data filed at 02/05/13 1400  Gross per 24 hour  Intake   1410 ml  Output    500 ml  Net    910 ml    Doing well POD # 2

## 2013-02-05 NOTE — Progress Notes (Signed)
[facility]  Daily Progress Note  Assessment/Planning: POD #2 s/p R CEA/CABG   Intact neuro  No neck hematoma  Will periodically check on patient  Subjective  - 2 Days Post-Op  Pain well controlled  Objective Filed Vitals:   02/05/13 0400 02/05/13 0500 02/05/13 0600 02/05/13 0700  BP: 94/63 103/55 93/57 110/62  Pulse: 69 69 69 75  Temp:      TempSrc:      Resp: 12 11 16 16   Height:      Weight:      SpO2: 100% 100% 99% 98%    Intake/Output Summary (Last 24 hours) at 02/05/13 0732 Last data filed at 02/05/13 0700  Gross per 24 hour  Intake 1415.29 ml  Output    650 ml  Net 765.29 ml    NECK  Inc c/d/i PULM  BLL rales NEURO Midline tongue, Motor BUE 4/5, BLE 5/5 sym  Laboratory CBC    Component Value Date/Time   WBC 11.0* 02/04/2013 1627   HGB 8.2* 02/04/2013 1634   HCT 24.0* 02/04/2013 1634   PLT 146* 02/04/2013 1627    BMET    Component Value Date/Time   NA 134* 02/05/2013 0445   K 4.8 02/05/2013 0445   CL 98 02/05/2013 0445   CO2 22 02/05/2013 0445   GLUCOSE 125* 02/05/2013 0445   BUN 32* 02/05/2013 0445   CREATININE 4.26* 02/05/2013 0445   CALCIUM 8.6 02/05/2013 0445   GFRNONAA 13* 02/05/2013 0445   GFRAA 15* 02/05/2013 0445    Leonides Sake, MD [facility] [place] Office: [phone] Pager: [phone]  02/05/2013, 7:32 AM

## 2013-02-05 NOTE — Progress Notes (Signed)
2 Days Post-Op Procedure(s) (LRB): CORONARY ARTERY BYPASS GRAFTING (CABG) (N/A) ENDARTERECTOMY CAROTID (N/A) Subjective: Legs buckled when I got up Mild pain Denies nausea, wants to eat  Objective: Vital signs in last 24 hours: Temp:  [97.3 F (36.3 C)-98.6 F (37 C)] 97.5 F (36.4 C) (06/21 0741) Pulse Rate:  [69-90] 75 (06/21 0800) Cardiac Rhythm:  [-] Normal sinus rhythm (06/21 0800) Resp:  [11-21] 17 (06/21 0800) BP: (80-138)/(42-76) 122/71 mmHg (06/21 0800) SpO2:  [91 %-100 %] 99 % (06/21 0800) Arterial Line BP: (93-126)/(43-61) 101/46 mmHg (06/20 1800) FiO2 (%):  [35 %-57.8 %] 35 % (06/20 1515) Weight:  [197 lb 15.6 oz (89.8 kg)] 197 lb 15.6 oz (89.8 kg) (06/21 0600)  Hemodynamic parameters for last 24 hours: PAP: (27-32)/(19-21) 29/19 mmHg CO:  [6.1 L/min] 6.1 L/min CI:  [3.3 L/min/m2] 3.3 L/min/m2  Intake/Output from previous day: 06/20 0701 - 06/21 0700 In: 1415.3 [P.O.:240; I.V.:775.3; IV Piggyback:400] Out: 650 [Urine:505; Drains:15; Chest Tube:130] Intake/Output this shift: Total I/O In: 20 [I.V.:20] Out: 40 [Urine:40]  General appearance: alert and no distress Neurologic: intact Heart: regular rate and rhythm Lungs: diminished breath sounds bibasilar Abdomen: distended, tympanitic, nontender  Lab Results:  Recent Labs  02/04/13 0430 02/04/13 1627 02/04/13 1634  WBC 8.9 11.0*  --   HGB 8.5* 7.9* 8.2*  HCT 25.8* 24.9* 24.0*  PLT 195 146*  --    BMET:  Recent Labs  02/04/13 0430  02/04/13 1634 02/05/13 0445  NA 134*  --  137 134*  K 4.1  --  4.3 4.8  CL 99  --  103 98  CO2 21  --   --  22  GLUCOSE 111*  --  90 125*  BUN 26*  --  28* 32*  CREATININE 3.45*  < > 4.00* 4.26*  CALCIUM 8.0*  --   --  8.6  < > = values in this interval not displayed.  PT/INR:  Recent Labs  02/03/13 1430  LABPROT 17.5*  INR 1.48   ABG    Component Value Date/Time   PHART 7.461* 02/04/2013 1303   HCO3 20.4 02/04/2013 1303   TCO2 23 02/04/2013 1634   ACIDBASEDEF 3.0* 02/04/2013 1303   O2SAT 98.0 02/04/2013 1303   CBG (last 3)   Recent Labs  02/04/13 2351 02/05/13 0402 02/05/13 0739  GLUCAP 117* 117* 117*    Assessment/Plan: S/P Procedure(s) (LRB): CORONARY ARTERY BYPASS GRAFTING (CABG) (N/A) ENDARTERECTOMY CAROTID (N/A) POD #2 CV- stable  Neuro- more alert today, no focal deficit  RESP- bibasilar atelectasis L > R- IS  RENAL- ESRD usual HD M,W,F  Has not been dialyzed yet postop- will call renal  GI- gastric dilation- will give reglan, clears until stomach starts emptying   LOS: 2 days    Nicholas [name] C 02/05/2013

## 2013-02-05 NOTE — Procedures (Signed)
I was present at this dialysis session. I have reviewed the session itself and made appropriate changes.   Vinson Moselle, MD [employer] 02/05/2013, 5:39 PM

## 2013-02-05 NOTE — Consult Note (Signed)
Nicholas [name] 02/05/2013 Nicholas [name] D Requesting Physician:  Dr Dorris Fetch  Reason for Consult:  ESRD pt had CABG two days ago HPI: The patient is a 72 y.o. year-old with hx of ESRD, DM, HTN and CAD. Recently in hospital for PNA , GIB, afib w RVR and had NSTEMI. ECHO showed dec'd EF from prior and heart cath on 5/27 showed 3V CAD. He was admitted by surgeons and underwent combined R CEA and CABG x 5 on 6/19, 2d ago.  His last HD was on 6/18, did not have HD yesterday. Today he is up in the chair and reporting swelling in abdomen, mild SOB and UE swelling he thinks from not having dialysis yet.  No CP, no abd pain.   ROS  no jt pain or skin rash  no confusion  no cough  no fevers    Past Medical History  Past Medical History  Diagnosis Date  . Coronary artery disease     a. hx of MI 1992; b.echo 12/02/12: Mild LVH, EF 50%, grade 1 diastolic dysfunction, mild MR, mild LAE, PASP 22.  c.  Myoview 12/09/12: Inferior defect with inferior and septal HK (favor prior MI), EF 47%  . Diabetes mellitus   . Hypertension   . Arthritis   . Carotid artery disease 09/2012    was due for R CEA in March 2014.   . Gastritis 2012 and 09/2011    treated for H Pylori in 12/2011  . Diabetic neuropathy   . Osteoporosis   . COPD (chronic obstructive pulmonary disease)   . Anxiety   . Depression   . ESRD (end stage renal disease)     M-W-Sa  [name]  . Diabetic nephropathy   . GERD (gastroesophageal reflux disease)   . Neuromuscular disorder     perphieal neurotherapy  . Anemia     Past Surgical History  Past Surgical History  Procedure Laterality Date  . Coronary stent placement    . Hip surgery      both hips  . Av fistula placement      Left arm  . Cardiac catheterization    . Joint replacement Bilateral   . Eye surgery Bilateral     cararacts  . Coronary artery bypass graft N/A 02/03/2013    Procedure: CORONARY ARTERY BYPASS GRAFTING (CABG);  Surgeon: Loreli Slot,  MD;  Location: [facility] OR;  Service: Open Heart Surgery;  Laterality: N/A;  . Endarterectomy N/A 02/03/2013    Procedure: ENDARTERECTOMY CAROTID;  Surgeon: Pryor Ochoa, MD;  Location: [facility] OR;  Service: Vascular;  Laterality: N/A;    Family History  Family History  Problem Relation Age of Onset  . Heart attack Father 29  . Heart disease Father   . Stroke Mother   . Hypertension Mother   . Other Mother     varicose veins  . Stroke Maternal Grandmother   . Hypertension Brother    Social History  reports that he quit smoking about 22 years ago. His smoking use included Cigarettes. He started smoking about 47 years ago. He has a 25 pack-year smoking history. He does not have any smokeless tobacco history on file. He reports that  drinks alcohol. He reports that he does not use illicit drugs.  Allergies  Allergies  Allergen Reactions  . Statins Other (See Comments)    Muscle weakness     Home medications Prior to Admission medications   Medication Sig Start Date End Date Taking? Authorizing Provider  allopurinol (ZYLOPRIM) 100 MG tablet Take 100 mg by mouth 2 (two) times daily.    Yes Historical Provider, MD  amiodarone (PACERONE) 200 MG tablet Take 200 mg by mouth daily.   Yes Historical Provider, MD  aspirin 81 MG chewable tablet Chew 1 tablet (81 mg total) by mouth daily. 10/27/12  Yes Vassie Loll, MD  fish oil-omega-3 fatty acids 1000 MG capsule Take 1 g by mouth 2 (two) times daily.    Yes Historical Provider, MD  gabapentin (NEURONTIN) 300 MG capsule Take 600 mg by mouth 2 (two) times daily.    Yes Historical Provider, MD  insulin aspart protamine-insulin aspart (NOVOLOG 70/30) (70-30) 100 UNIT/ML injection Inject 2-10 Units into the skin 3 (three) times daily as needed (Per sliding scale, based on sugar levels).   Yes Historical Provider, MD  insulin glargine (LANTUS) 100 UNIT/ML injection Inject 15 Units into the skin at bedtime. 10/27/12  Yes Vassie Loll, MD  lidocaine-prilocaine  (EMLA) cream Apply 1 application topically every Monday, Wednesday, and Friday.  11/03/12  Yes Historical Provider, MD  metoprolol succinate (TOPROL-XL) 50 MG 24 hr tablet Take 25-50 mg by mouth 2 (two) times daily. Take 50 mg in the am and 25 mg in the pm. Take with or immediately following a meal. 01/04/13  Yes Beatrice Lecher, PA-C  Multiple Vitamins-Minerals (MULTIVITAMIN WITH MINERALS) tablet Take 1 tablet by mouth daily.   Yes Historical Provider, MD  multivitamin (RENA-VIT) TABS tablet Take 1 tablet by mouth at bedtime.    Yes Historical Provider, MD  pantoprazole (PROTONIX) 40 MG tablet Take 40 mg by mouth daily.   Yes Historical Provider, MD  sevelamer carbonate (RENVELA) 800 MG tablet Take 800 mg by mouth 3 (three) times daily with meals.   Yes Historical Provider, MD  tamsulosin (FLOMAX) 0.4 MG CAPS Take 0.4 mg by mouth at bedtime.   Yes Historical Provider, MD  traMADol (ULTRAM) 50 MG tablet Take 50 mg by mouth every 6 (six) hours as needed for pain.   Yes Historical Provider, MD    LABS Liver Function Tests  Recent Labs Lab 02/01/13 1549  AST 17  ALT 9  ALKPHOS 102  BILITOT 0.2*  PROT 6.6  ALBUMIN 3.1*   No results found for this basename: LIPASE, AMYLASE,  in the last 168 hours CBC  Recent Labs Lab 02/03/13 2330 02/04/13 0430 02/04/13 1627 02/04/13 1634  WBC 10.9* 8.9 11.0*  --   HGB 9.6* 8.5* 7.9* 8.2*  HCT 29.5* 25.8* 24.9* 24.0*  MCV 95.8 95.9 98.0  --   PLT 193 195 146*  --    Basic Metabolic Panel  Recent Labs Lab 02/01/13 1549  02/03/13 1102 02/03/13 1204 02/03/13 1311 02/03/13 1428 02/03/13 2330 02/04/13 0030 02/04/13 0430 02/04/13 1627 02/04/13 1634 02/05/13 0445  NA 139  < > 136 136 136 137  --   --  134*  --  137 134*  K 3.3*  < > 3.3* 4.7 4.4 3.9  --  4.1 4.1  --  4.3 4.8  CL 97  --   --   --   --   --   --   --  99  --  103 98  CO2 24  --   --   --   --   --   --   --  21  --   --  22  GLUCOSE 169*  < > 97 113* 133* 108*  --   --  111*   --  90 125*  BUN 31*  --   --   --   --   --   --   --  26*  --  28* 32*  CREATININE 3.85*  --   --   --   --   --  3.41*  --  3.45* 4.18* 4.00* 4.26*  CALCIUM 8.5  --   --   --   --   --   --   --  8.0*  --   --  8.6  < > = values in this interval not displayed.  Physical Exam:  Blood pressure 102/58, pulse 84, temperature 97.2 F (36.2 C), temperature source Oral, resp. rate 16, height 5\' 2"  (1.575 m), weight 89.8 kg (197 lb 15.6 oz), SpO2 97.00%. Gen: elderly WM sitting up in chair, fully alert and Ox3, no distress HEENT:  EOMI, sclera anicteric, throat clear Neck: no JVD, no LAN Chest: clear on R, dec'd BS on L 1/2 up CV: regular, no rub or gallop, +2/6 SEM, no carotid or femoral bruits, pedal pulses intact Abdomen: soft, mod distended, obese, no ascites or HSM noted Ext: 1+ UE and LE edema bilat, no joint effusion or deformity, no gangrene or ulceration Neuro: alert, Ox3, no focal deficit Access: L forearm AVF +bruit  Outpatient HD: MWF [facility]  4hrs  F180  400/A1.5   EDW 80.5kg   Bath 2K/2.25Ca   LFA AVF   Hect 2ug   EPO 6800   Venofer 50mg /wk     CXR today- mod large L effusion  Impression/Plan 1. CAD and carotid stenosis, s/p CABG and R CEA 6/19 2. ESRD, needs HD today then resume MWF schedule.  3. HTN / volume excess / pleural effusions- 8kg up by weights today, BP's soft, on bid lowdose metoprolol. Will attempt 2.5 kg UF today 4. Anemia of CKD- hb 8.2, will start darbe in place of EPO, check tsat 5. Secondary HPTH- cont hectorol and binders (renvela) 6. DM - on insulin  Will follow  Vinson Moselle  MD [facility] [phone] pgr    [phone] cell 02/05/2013, 12:17 PM

## 2013-02-05 NOTE — Progress Notes (Signed)
Hemodialysis Tx complete. Pt initially had a low BP and was not able to tolerate UF. 25g albumin given and BP improved and remained adequate for UF the remaining of the Tx. Goal not met due to low BP at start of Tx.

## 2013-02-06 ENCOUNTER — Inpatient Hospital Stay (HOSPITAL_COMMUNITY): Payer: Medicare Other

## 2013-02-06 LAB — GLUCOSE, CAPILLARY
Glucose-Capillary: 85 mg/dL (ref 70–99)
Glucose-Capillary: 90 mg/dL (ref 70–99)
Glucose-Capillary: 151 mg/dL — ABNORMAL HIGH (ref 70–99)
Glucose-Capillary: 111 mg/dL — ABNORMAL HIGH (ref 70–99)

## 2013-02-06 LAB — BASIC METABOLIC PANEL
Chloride: 97 mEq/L (ref 96–112)
Creatinine, Ser: 2.96 mg/dL — ABNORMAL HIGH (ref 0.50–1.35)
GFR calc Af Amer: 23 mL/min — ABNORMAL LOW (ref >90)

## 2013-02-06 LAB — CBC
MCV: 99.6 fL (ref 78.0–100.0)
Platelets: 183 10*3/uL (ref 150–400)
RDW: 17.4 % — ABNORMAL HIGH (ref 11.5–15.5)
WBC: 8 10*3/uL (ref 4.0–10.5)

## 2013-02-06 MED ORDER — HEPARIN SODIUM (PORCINE) 1000 UNIT/ML DIALYSIS: 1000.0000 [IU] | INTRAMUSCULAR | Status: DC | PRN

## 2013-02-06 MED ORDER — SODIUM CHLORIDE 0.9 % IV SOLN: 100.0000 mL | INTRAVENOUS | Status: DC | PRN

## 2013-02-06 MED ORDER — PENTAFLUOROPROP-TETRAFLUOROETH EX AERO: 1.0000 "application " | INHALATION_SPRAY | CUTANEOUS | Status: DC | PRN

## 2013-02-06 MED ORDER — ALTEPLASE 2 MG IJ SOLR: 2.0000 mg | Freq: Once | INTRAMUSCULAR | Status: DC | PRN

## 2013-02-06 MED ORDER — LIDOCAINE HCL (PF) 1 % IJ SOLN: 5.0000 mL | INTRAMUSCULAR | Status: DC | PRN

## 2013-02-06 MED ORDER — LIDOCAINE-PRILOCAINE 2.5-2.5 % EX CREA: 1.0000 "application " | TOPICAL_CREAM | CUTANEOUS | Status: DC | PRN

## 2013-02-06 MED ORDER — SODIUM CHLORIDE 0.9 % IJ SOLN: 10.0000 mL | INTRAMUSCULAR | Status: DC | PRN

## 2013-02-06 MED ORDER — DARBEPOETIN ALFA-POLYSORBATE 100 MCG/0.5ML IJ SOLN
100.0000 ug | INTRAMUSCULAR | Status: DC
  Filled 2013-02-06 (×2): qty 0.5

## 2013-02-06 MED ORDER — NEPRO/CARBSTEADY PO LIQD: 237.0000 mL | ORAL | Status: DC | PRN

## 2013-02-06 MED ORDER — ALBUMIN HUMAN 25 % IV SOLN
INTRAVENOUS | Status: AC
  Filled 2013-02-06: qty 100

## 2013-02-06 MED ORDER — ALBUMIN HUMAN 25 % IV SOLN
25.0000 g | Freq: Once | INTRAVENOUS | Status: AC
  Administered 2013-02-06: 25 g via INTRAVENOUS

## 2013-02-06 MED ORDER — SODIUM CHLORIDE 0.9 % IJ SOLN
10.0000 mL | Freq: Two times a day (BID) | INTRAMUSCULAR | Status: DC
  Administered 2013-02-06 – 2013-02-07 (×2): 10 mL

## 2013-02-06 MED ORDER — ALBUMIN HUMAN 25 % IV SOLN: 12.5000 g | Freq: Once | INTRAVENOUS | Status: DC

## 2013-02-06 MED ORDER — ALTEPLASE 2 MG IJ SOLR
2.0000 mg | Freq: Once | INTRAMUSCULAR | Status: DC | PRN
  Filled 2013-02-06: qty 2

## 2013-02-06 NOTE — Progress Notes (Signed)
Subjective: 2kg off with HD yesterday, BP dropped preventing more UF Objective Filed Vitals:   02/06/13 0300 02/06/13 0400 02/06/13 0404 02/06/13 0409  BP: 113/67 82/42 83/45  92/52  Pulse: 88 78 78 78  Temp:  98.8 F (37.1 C)    TempSrc:  Oral    Resp: 14 10 11 10   Height:      Weight:      SpO2: 100% 100% 100% 100%    CBC:  Recent Labs Lab 02/04/13 0430 02/04/13 1627 02/04/13 1634 02/05/13 2330  WBC 8.9 11.0*  --  8.6  HGB 8.5* 7.9* 8.2* 7.4*  HCT 25.8* 24.9* 24.0* 23.7*  MCV 95.9 98.0  --  99.2  PLT 195 146*  --  172   Basic Metabolic Panel:  Recent Labs Lab 02/01/13 1549  02/04/13 0430 02/04/13 1627 02/04/13 1634 02/05/13 0445  NA 139  < > 134*  --  137 134*  K 3.3*  < > 4.1  --  4.3 4.8  CL 97  --  99  --  103 98  CO2 24  --  21  --   --  22  GLUCOSE 169*  < > 111*  --  90 125*  BUN 31*  --  26*  --  28* 32*  CREATININE 3.85*  < > 3.45* 4.18* 4.00* 4.26*  CALCIUM 8.5  --  8.0*  --   --  8.6  < > = values in this interval not displayed.  Physical Exam:  Blood pressure 92/52, pulse 78, temperature 98.8 F (37.1 C), temperature source Oral, resp. rate 10, height 5\' 2"  (1.575 m), weight 87.8 kg (193 lb 9 oz), SpO2 100.00%.  Gen: elderly WM sitting up in chair, a little confused today, no distress Neck: no JVD, no LAN  Chest: crackles R 1/3 up, bronchial BS on L 1/2 up  CV: regular, no rub or gallop, +2/6 SEM Abdomen: soft, mod distended, obese, no ascites or HSM noted  Ext: 1+ UE and LE edema bilat Neuro: alert, Ox3, no focal deficit  Access: L forearm AVF +bruit   Outpatient HD: MWF [facility] 4hrs F180 400/A1.5 EDW 80.5kg Bath 2K/2.25Ca LFA AVF Hect 2ug EPO 6800 Venofer 50mg /wk   Impression/Plan  1. CAD and carotid stenosis, s/p CABG and R CEA 6/19 2. ESRD- extra HD today, then again tomorrow to keep on schedule and to get volume down 3. HTN / volume excess / bilat pleural effusions- 6kg up by weights today, BP low > have d/c'd metoprolol for now. HD  today UF 2.5kg as tolerated 4. Anemia of CKD- Hb 8.2, will start darbe in place of EPO, Tsat pend 5. Secondary HPTH- cont hectorol and binders (renvela) 6. DM - on insulin   Nicholas Moselle  MD [phone] pgr    [phone] cell 02/06/2013, 4:29 AM

## 2013-02-06 NOTE — Progress Notes (Signed)
3 Days Post-Op Procedure(s) (LRB): CORONARY ARTERY BYPASS GRAFTING (CABG) (N/A) ENDARTERECTOMY CAROTID (N/A) Subjective: No complaints Wants to know when he can go home   Objective: Vital signs in last 24 hours: Temp:  [97.2 F (36.2 C)-99 F (37.2 C)] 99 F (37.2 C) (06/22 0734) Pulse Rate:  [74-115] 92 (06/22 0900) Cardiac Rhythm:  [-] Normal sinus rhythm (06/22 0745) Resp:  [10-21] 18 (06/22 0900) BP: (68-120)/(21-87) 96/59 mmHg (06/22 0900) SpO2:  [84 %-100 %] 98 % (06/22 0900) Weight:  [182 lb 12.2 oz (82.9 kg)-197 lb 15.6 oz (89.8 kg)] 182 lb 12.2 oz (82.9 kg) (06/22 0500)  Hemodynamic parameters for last 24 hours:    Intake/Output from previous day: 06/21 0701 - 06/22 0700 In: 1030 [P.O.:960; I.V.:20; IV Piggyback:50] Out: 2126 [Urine:125] Intake/Output this shift:    General appearance: alert and no distress Neurologic: intact Heart: regular rate and rhythm Lungs: diminished breath sounds bibasilar Abdomen: normal findings: soft, non-tender  Lab Results:  Recent Labs  02/05/13 2330 02/06/13 0415  WBC 8.6 8.0  HGB 7.4* 7.1*  HCT 23.7* 22.6*  PLT 172 183   BMET:  Recent Labs  02/05/13 0445 02/06/13 0415  NA 134* 136  K 4.8 4.0  CL 98 97  CO2 22 28  GLUCOSE 125* 62*  BUN 32* 21  CREATININE 4.26* 2.96*  CALCIUM 8.6 8.1*    PT/INR:  Recent Labs  02/03/13 1430  LABPROT 17.5*  INR 1.48   ABG    Component Value Date/Time   PHART 7.461* 02/04/2013 1303   HCO3 20.4 02/04/2013 1303   TCO2 23 02/04/2013 1634   ACIDBASEDEF 3.0* 02/04/2013 1303   O2SAT 98.0 02/04/2013 1303   CBG (last 3)   Recent Labs  02/05/13 1549 02/05/13 2128 02/06/13 0735  GLUCAP 103* 82 85    Assessment/Plan: S/P Procedure(s) (LRB): CORONARY ARTERY BYPASS GRAFTING (CABG) (N/A) ENDARTERECTOMY CAROTID (N/A) POD # 3 CABG / R CEA  Neuro- no focal deficits,   CV- BP remains relatively low, maintaining SR  RESP- bilateral basilar atelectasis and effusions- HD,  IS  GI- gastric dilatation resolved- abdomen soft, + BS- advance diet  RENAL- ESRD- HD today and tomorrow  Deconditioning- oob, ambulation   LOS: 3 days    Sabiha Sura C 02/06/2013

## 2013-02-07 ENCOUNTER — Inpatient Hospital Stay (HOSPITAL_COMMUNITY): Payer: Medicare Other

## 2013-02-07 LAB — BASIC METABOLIC PANEL
Chloride: 99 mEq/L (ref 96–112)
Creatinine, Ser: 2.61 mg/dL — ABNORMAL HIGH (ref 0.50–1.35)
GFR calc Af Amer: 27 mL/min — ABNORMAL LOW (ref >90)
Sodium: 138 mEq/L (ref 135–145)

## 2013-02-07 LAB — CBC
MCV: 98.7 fL (ref 78.0–100.0)
Platelets: 184 10*3/uL (ref 150–400)
RDW: 17 % — ABNORMAL HIGH (ref 11.5–15.5)
WBC: 7.5 10*3/uL (ref 4.0–10.5)

## 2013-02-07 LAB — GLUCOSE, CAPILLARY: Glucose-Capillary: 70 mg/dL (ref 70–99)

## 2013-02-07 MED ORDER — GUAIFENESIN-DM 100-10 MG/5ML PO SYRP
15.0000 mL | ORAL_SOLUTION | ORAL | Status: DC | PRN
  Administered 2013-02-12 (×2): 15 mL via ORAL
  Filled 2013-02-07: qty 5
  Filled 2013-02-07: qty 15

## 2013-02-07 MED ORDER — SODIUM CHLORIDE 0.9 % IV SOLN: 250.0000 mL | INTRAVENOUS | Status: DC | PRN

## 2013-02-07 MED ORDER — FLEET ENEMA 7-19 GM/118ML RE ENEM
1.0000 | ENEMA | Freq: Once | RECTAL | Status: AC
  Administered 2013-02-07: 1 via RECTAL
  Filled 2013-02-07: qty 1

## 2013-02-07 MED ORDER — DIPHENHYDRAMINE HCL 25 MG PO CAPS: 25.0000 mg | ORAL_CAPSULE | Freq: Every evening | ORAL | Status: DC | PRN

## 2013-02-07 MED ORDER — RENA-VITE PO TABS
1.0000 | ORAL_TABLET | Freq: Every day | ORAL | Status: DC
  Administered 2013-02-07 – 2013-02-15 (×9): 1 via ORAL
  Filled 2013-02-07 (×10): qty 1

## 2013-02-07 MED ORDER — SODIUM CHLORIDE 0.9 % IJ SOLN
3.0000 mL | Freq: Two times a day (BID) | INTRAMUSCULAR | Status: DC
  Administered 2013-02-07 – 2013-02-14 (×15): 3 mL via INTRAVENOUS

## 2013-02-07 MED ORDER — ZOLPIDEM TARTRATE 5 MG PO TABS: 5.0000 mg | ORAL_TABLET | Freq: Every evening | ORAL | Status: DC | PRN

## 2013-02-07 MED ORDER — TRAMADOL HCL 50 MG PO TABS: 50.0000 mg | ORAL_TABLET | ORAL | Status: DC | PRN

## 2013-02-07 MED ORDER — MOVING RIGHT ALONG BOOK
Freq: Once | Status: AC
  Administered 2013-02-07: 21:00:00
  Filled 2013-02-07: qty 1

## 2013-02-07 MED ORDER — SODIUM CHLORIDE 0.9 % IJ SOLN: 3.0000 mL | INTRAMUSCULAR | Status: DC | PRN

## 2013-02-07 MED ORDER — OXYCODONE HCL 5 MG PO TABS
ORAL_TABLET | ORAL | Status: AC
  Administered 2013-02-07: 5 mg via ORAL
  Filled 2013-02-07: qty 1

## 2013-02-07 MED ORDER — PENTAFLUOROPROP-TETRAFLUOROETH EX AERO
INHALATION_SPRAY | CUTANEOUS | Status: AC
  Administered 2013-02-07: 1
  Filled 2013-02-07: qty 103.5

## 2013-02-07 NOTE — Progress Notes (Signed)
4 Days Post-Op Procedure(s) (LRB): CORONARY ARTERY BYPASS GRAFTING (CABG) (N/A) ENDARTERECTOMY CAROTID (N/A) Subjective: No complaints Has not had a BM  Objective: Vital signs in last 24 hours: Temp:  [97.9 F (36.6 C)-99.1 F (37.3 C)] 98.6 F (37 C) (06/23 0711) Pulse Rate:  [69-111] 102 (06/23 0700) Cardiac Rhythm:  [-] Normal sinus rhythm (06/22 2000) Resp:  [11-22] 21 (06/23 0700) BP: (88-149)/(49-77) 142/73 mmHg (06/23 0700) SpO2:  [87 %-100 %] 98 % (06/23 0700) Weight:  [186 lb 1.1 oz (84.4 kg)-196 lb 10.4 oz (89.2 kg)] 186 lb 1.1 oz (84.4 kg) (06/23 0600)  Hemodynamic parameters for last 24 hours:    Intake/Output from previous day: 06/22 0701 - 06/23 0700 In: 980 [P.O.:960; I.V.:20] Out: 2503  Intake/Output this shift:    General appearance: alert and no distress Neurologic: intact Heart: regular rate and rhythm Lungs: diminished breath sounds left base Abdomen: normal findings: soft, non-tender Wound: clean and dry  Lab Results:  Recent Labs  02/06/13 0415 02/07/13 0343  WBC 8.0 7.5  HGB 7.1* 7.0*  HCT 22.6* 22.0*  PLT 183 184   BMET:  Recent Labs  02/06/13 0415 02/07/13 0343  NA 136 138  K 4.0 3.1*  CL 97 99  CO2 28 31  GLUCOSE 62* 73  BUN 21 17  CREATININE 2.96* 2.61*  CALCIUM 8.1* 8.5    PT/INR: No results found for this basename: LABPROT, INR,  in the last 72 hours ABG    Component Value Date/Time   PHART 7.461* 02/04/2013 1303   HCO3 20.4 02/04/2013 1303   TCO2 23 02/04/2013 1634   ACIDBASEDEF 3.0* 02/04/2013 1303   O2SAT 98.0 02/04/2013 1303   CBG (last 3)   Recent Labs  02/06/13 1756 02/06/13 2158 02/07/13 0710  GLUCAP 151* 111* 70    Assessment/Plan: S/P Procedure(s) (LRB): CORONARY ARTERY BYPASS GRAFTING (CABG) (N/A) ENDARTERECTOMY CAROTID (N/A) Plan for transfer to step-down: see transfer orders  CV- POD # 4 CEA/ CABG  BP better today  Maintaining SR  RESP- LLL atelectasis, left effusion, elevated left  hemidiaphragm  RENAL- HD again today  Hypokalemia- defer to Nephrology  Anemia- Stable  Will transfer to 2000 today    LOS: 4 days    HENDRICKSON,STEVEN C 02/07/2013

## 2013-02-07 NOTE — Progress Notes (Signed)
S:pain controlled.  No SOB O:BP 142/73  Pulse 102  Temp(Src) 98.6 F (37 C) (Oral)  Resp 21  Ht 5\' 2"  (1.575 m)  Wt 84.4 kg (186 lb 1.1 oz)  BMI 34.02 kg/m2  SpO2 98%  Intake/Output Summary (Last 24 hours) at 02/07/13 0756 Last data filed at 02/07/13 0600  Gross per 24 hour  Intake    980 ml  Output   2503 ml  Net  -1523 ml   Weight change: -0.6 kg (-1 lb 5.2 oz) ZOX:WRUEA and alert CVS:RRR Resp:decreased BS Lt base, crackles r base Abd:+BS NTND VWU:JWJXB edema.  L AVF + bruit NEURO:CNI Ox3 no asterixis   . acetaminophen  1,000 mg Oral Q6H   Or  . acetaminophen (TYLENOL) oral liquid 160 mg/5 mL  975 mg Per Tube Q6H  . allopurinol  100 mg Oral BID  . amiodarone  200 mg Oral Daily  . aspirin EC  325 mg Oral Daily   Or  . aspirin  324 mg Per Tube Daily  . bisacodyl  10 mg Oral Daily   Or  . bisacodyl  10 mg Rectal Daily  . darbepoetin  100 mcg Subcutaneous Q7 days  . docusate sodium  200 mg Oral Daily  . enoxaparin (LOVENOX) injection  30 mg Subcutaneous QHS  . gabapentin  600 mg Oral BID  . insulin aspart  0-24 Units Subcutaneous Once  . insulin aspart  0-9 Units Subcutaneous TID WC  . insulin aspart  3 Units Subcutaneous TID WC  . insulin detemir  20 Units Subcutaneous Q1200  . magnesium sulfate  4 g Intravenous Once  . pantoprazole  40 mg Oral Daily  . sevelamer carbonate  800 mg Oral TID WC  . sodium chloride  10-40 mL Intracatheter Q12H  . sodium chloride  3 mL Intravenous Q12H  . tamsulosin  0.4 mg Oral QHS   Dg Chest Port 1 View  02/07/2013   *RADIOLOGY REPORT*  Clinical Data: Follow-up atelectasis and effusions  PORTABLE CHEST - 1 VIEW  Comparison: 06/22  Findings: Left-sided venous access sheath remains in place.  There is better aeration of the right lung with only mild atelectasis at the right base.  Chronically elevated left hemidiaphragm persists with chronic volume loss at the left base and some pleural fluid. Generalized edema pattern has improved.   IMPRESSION: Less pulmonary edema.  Persistent elevation of the left hemidiaphragm with volume loss at the left base and a small left effusion.   Original Report Authenticated By: Paulina Fusi, M.D.   Dg Chest Port 1 View  02/06/2013   *RADIOLOGY REPORT*  Clinical Data: CABG  PORTABLE CHEST - 1 VIEW  Comparison: 02/05/2013  Findings: Sternotomy wires overlie enlarged cardiac silhouette. Left  IJ sheath remains.  There is bibasilar atelectasis and bilateral pleural effusions unchanged.  There is interval increase in central venous congestion and mild interstitial edema.  No pneumothorax.  IMPRESSION:  1.  Increase in central venous congestion and interstitial edema. 2.  Low lung volumes and bilateral pleural effusions.   Original Report Authenticated By: Genevive Bi, M.D.   BMET    Component Value Date/Time   NA 138 02/07/2013 0343   K 3.1* 02/07/2013 0343   CL 99 02/07/2013 0343   CO2 31 02/07/2013 0343   GLUCOSE 73 02/07/2013 0343   BUN 17 02/07/2013 0343   CREATININE 2.61* 02/07/2013 0343   CALCIUM 8.5 02/07/2013 0343   GFRNONAA 23* 02/07/2013 0343   GFRAA 27* 02/07/2013 1478  CBC    Component Value Date/Time   WBC 7.5 02/07/2013 0343   RBC 2.23* 02/07/2013 0343   HGB 7.0* 02/07/2013 0343   HCT 22.0* 02/07/2013 0343   PLT 184 02/07/2013 0343   MCV 98.7 02/07/2013 0343   MCH 31.4 02/07/2013 0343   MCHC 31.8 02/07/2013 0343   RDW 17.0* 02/07/2013 0343   LYMPHSABS 1.5 01/06/2013 0753   MONOABS 0.8 01/06/2013 0753   EOSABS 0.5 01/06/2013 0753   BASOSABS 0.1 01/06/2013 0753     Assessment:  1.  SP CABG and R CAE 2. Hypotension, improved 3. Anemia on aranesp 4. 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 DM 6. ESRD  Plan: 1. Plan HD today on 4K bath and try to pull fluid 2. Resume renal vitamin   Kaylina Cahue T

## 2013-02-07 NOTE — Progress Notes (Signed)
Inpatient Diabetes Program Recommendations  AACE/ADA: New Consensus Statement on Inpatient Glycemic Control (2013)  Target Ranges:  Prepandial:   less than 140 mg/dL      Peak postprandial:   less than 180 mg/dL (1-2 hours)      Critically ill patients:  140 - 180 mg/dL   Reason for Visit: Fasting CBG=70 mg/dL.    Please reduce Levemir to 12 units q HS.  Will follow.

## 2013-02-07 NOTE — Progress Notes (Signed)
Patient ID: Nicholas [name], male   DOB: 01/30/1941, 72 y.o.   MRN: [id number] EVENING ROUNDS NOTE :     [address]       [place] [address]             [phone]                 4 Days Post-Op Procedure(s) (LRB): CORONARY ARTERY BYPASS GRAFTING (CABG) (N/A) ENDARTERECTOMY CAROTID (N/A)  Total Length of Stay:  LOS: 4 days  BP 128/72  Pulse 106  Temp(Src) 98.3 F (36.8 C) (Oral)  Resp 24  Ht 5\' 2"  (1.575 m)  Wt 182 lb 1.6 oz (82.6 kg)  BMI 33.3 kg/m2  SpO2 100%  .Intake/Output     06/22 0701 - 06/23 0700 06/23 0701 - 06/24 0700   P.O. 960 240   I.V. (mL/kg) 20 (0.2)    IV Piggyback     Total Intake(mL/kg) 980 (11.6) 240 (2.9)   Urine (mL/kg/hr)     Other 2503 (1.2) 2971 (3.3)   Total Output 2503 2971   Net -1523 -2731              Lab Results  Component Value Date   WBC 7.5 02/07/2013   HGB 7.0* 02/07/2013   HCT 22.0* 02/07/2013   PLT 184 02/07/2013   GLUCOSE 73 02/07/2013   ALT 9 02/01/2013   AST 17 02/01/2013   NA 138 02/07/2013   K 3.1* 02/07/2013   CL 99 02/07/2013   CREATININE 2.61* 02/07/2013   BUN 17 02/07/2013   CO2 31 02/07/2013   TSH 1.63 01/04/2013   INR 1.48 02/03/2013   HGBA1C 5.6 02/01/2013   Dialysis today Follow up H/H in am  Delight Ovens MD  Beeper [phone] Office [phone] 02/07/2013 5:49 PM

## 2013-02-07 NOTE — Progress Notes (Signed)
Patient transported via bed, with oxygen, and on monitor to Hemodialysis as ordered. Patient escorted by Dialysis RN and Tech. Safety maintained. No acute distress noted.

## 2013-02-07 NOTE — Procedures (Signed)
Pt seen on HD.  Ap 120  Vp 130.  SBP 135. On 4K bath.

## 2013-02-07 NOTE — Progress Notes (Signed)
Nursing  Report called to [facility], all questions answered.  Pt transported to 2015 with on monitor and 2L [place], pt placed on telemetry and vitals stable.  Meds given to Pt's RN and Pt without complaints, RN and NT at bedside.  Pt's brother updated on room change prior to pt being transferred.   L Mistina Coatney RN

## 2013-02-08 ENCOUNTER — Inpatient Hospital Stay (HOSPITAL_COMMUNITY): Payer: Medicare Other

## 2013-02-08 LAB — RENAL FUNCTION PANEL
CO2: 30 mEq/L (ref 19–32)
Calcium: 8.5 mg/dL (ref 8.4–10.5)
Creatinine, Ser: 2.57 mg/dL — ABNORMAL HIGH (ref 0.50–1.35)
GFR calc non Af Amer: 24 mL/min — ABNORMAL LOW (ref >90)
Glucose, Bld: 132 mg/dL — ABNORMAL HIGH (ref 70–99)
Phosphorus: 2.9 mg/dL (ref 2.3–4.6)

## 2013-02-08 LAB — CBC
HCT: 22.7 % — ABNORMAL LOW (ref 39.0–52.0)
MCH: 30.9 pg (ref 26.0–34.0)
MCV: 97.4 fL (ref 78.0–100.0)
RBC: 2.33 MIL/uL — ABNORMAL LOW (ref 4.22–5.81)
WBC: 8.4 10*3/uL (ref 4.0–10.5)

## 2013-02-08 LAB — GLUCOSE, CAPILLARY
Glucose-Capillary: 124 mg/dL — ABNORMAL HIGH (ref 70–99)
Glucose-Capillary: 141 mg/dL — ABNORMAL HIGH (ref 70–99)
Glucose-Capillary: 164 mg/dL — ABNORMAL HIGH (ref 70–99)

## 2013-02-08 LAB — IRON AND TIBC
Iron: 24 ug/dL — ABNORMAL LOW (ref 42–135)
TIBC: 133 ug/dL — ABNORMAL LOW (ref 215–435)

## 2013-02-08 MED ORDER — DARBEPOETIN ALFA-POLYSORBATE 100 MCG/0.5ML IJ SOLN
100.0000 ug | INTRAMUSCULAR | Status: DC
  Filled 2013-02-08: qty 0.5

## 2013-02-08 NOTE — Care Management Note (Signed)
    Page 1 of 2   02/15/2013     3:34:29 PM   CARE MANAGEMENT NOTE 02/15/2013  Patient:  Nicholas [name], Nicholas [name]   Account Number:  [id number]  Date Initiated:  02/07/2013  Documentation initiated by:  Junius Creamer  Subjective/Objective Assessment:   cabg     Action/Plan:   lives ALONE, pcp dr Daniel Nones   Anticipated DC Date:  02/15/2013   Anticipated DC Plan:  SKILLED NURSING FACILITY  In-house referral  Clinical Social Worker      DC Planning Services  CM consult      Choice offered to / List presented to:             Status of service:  Completed, signed off Medicare Important Message given?   (If response is "NO", the following Medicare IM given date fields will be blank) Date Medicare IM given:   Date Additional Medicare IM given:    Discharge Disposition:  SKILLED NURSING FACILITY  Per UR Regulation:  Reviewed for med. necessity/level of care/duration of stay  If discussed at Long Length of Stay Meetings, dates discussed:   02/08/2013  02/10/2013  02/15/2013    Comments:  02/15/13 Jonalyn Sedlak,RN,BSN [phone] PT DISCHARGED TO SNF TODAY, PER CSW ARRANGEMENTS.  02/11/13 Tiyon Sanor,RN,BSN [phone] PT EXPERIENCING GOUT FLARE; UNABLE TO AMBULATE. WILL LIKELY BE HERE OVER THE WEEKEND.  CSW CONT TO FOLLOW TO COORDINATE DC TO SNF.  02/08/13 Jojo Geving,RN,BSN [phone] MET WITH PT TO DISCUSS DC PLANS POST CABG.  PT STATES HE LIVES ALONE, AND WILL NEED SNF FOR REHAB AT DC.  HE IS INTERESTED IN [facility], AS IT IS CLOSE TO HIS HOME, AND DIALYSIS CENTER.  WILL CONSULT CSW TO FACILITATE DC TO SNF WHEN MEDICALLY STABLE FOR DC.  WILL FOLLOW.

## 2013-02-08 NOTE — Progress Notes (Signed)
S: No SOB  Eating some.  CO weakness.  Tolerated HD yest well O:BP 99/62  Pulse 4  Temp(Src) 98 F (36.7 C) (Oral)  Resp 18  Ht 5\' 2"  (1.575 m)  Wt 78 kg (171 lb 15.3 oz)  BMI 31.44 kg/m2  SpO2 93%  Intake/Output Summary (Last 24 hours) at 02/08/13 9604 Last data filed at 02/07/13 1552  Gross per 24 hour  Intake      0 ml  Output   2971 ml  Net  -2971 ml   Weight change: -3.2 kg (-7 lb 0.9 oz) VWU:JWJXB and alert CVS:RRR Resp:decreased BS Lt base, crackles r base Abd:+BS NTND JYN:WGNFA edema.  L AVF + bruit NEURO:CNI Ox3 no asterixis   . acetaminophen  1,000 mg Oral Q6H  . allopurinol  100 mg Oral BID  . amiodarone  200 mg Oral Daily  . aspirin EC  325 mg Oral Daily  . bisacodyl  10 mg Oral Daily   Or  . bisacodyl  10 mg Rectal Daily  . darbepoetin  100 mcg Subcutaneous Q7 days  . docusate sodium  200 mg Oral Daily  . enoxaparin (LOVENOX) injection  30 mg Subcutaneous QHS  . gabapentin  600 mg Oral BID  . insulin aspart  0-24 Units Subcutaneous Once  . insulin aspart  0-9 Units Subcutaneous TID WC  . insulin aspart  3 Units Subcutaneous TID WC  . insulin detemir  20 Units Subcutaneous Q1200  . multivitamin  1 tablet Oral Daily  . pantoprazole  40 mg Oral Daily  . sevelamer carbonate  800 mg Oral TID WC  . sodium chloride  3 mL Intravenous Q12H  . tamsulosin  0.4 mg Oral QHS   Dg Chest 2 View  02/08/2013   *RADIOLOGY REPORT*  Clinical Data: Pleural effusion.  Post CABG.  CHEST - 2 VIEW  Comparison: 02/10/2013.  Findings: Continued left pleural effusion with diffuse left lung airspace disease and elevation of the left hemidiaphragm. Cardiomegaly with vascular congestion.  No confluent opacity on the right.  No change.  IMPRESSION: No significant change.   Original Report Authenticated By: Charlett Nose, M.D.   Dg Chest Port 1 View  02/07/2013   *RADIOLOGY REPORT*  Clinical Data: Follow-up atelectasis and effusions  PORTABLE CHEST - 1 VIEW  Comparison: 06/22  Findings:  Left-sided venous access sheath remains in place.  There is better aeration of the right lung with only mild atelectasis at the right base.  Chronically elevated left hemidiaphragm persists with chronic volume loss at the left base and some pleural fluid. Generalized edema pattern has improved.  IMPRESSION: Less pulmonary edema.  Persistent elevation of the left hemidiaphragm with volume loss at the left base and a small left effusion.   Original Report Authenticated By: Paulina Fusi, M.D.   BMET    Component Value Date/Time   NA 138 02/08/2013 0400   K 3.3* 02/08/2013 0400   CL 95* 02/08/2013 0400   CO2 30 02/08/2013 0400   GLUCOSE 132* 02/08/2013 0400   BUN 19 02/08/2013 0400   CREATININE 2.57* 02/08/2013 0400   CALCIUM 8.5 02/08/2013 0400   GFRNONAA 24* 02/08/2013 0400   GFRAA 28* 02/08/2013 0400   CBC    Component Value Date/Time   WBC 8.4 02/08/2013 0400   RBC 2.33* 02/08/2013 0400   HGB 7.2* 02/08/2013 0400   HCT 22.7* 02/08/2013 0400   PLT 234 02/08/2013 0400   MCV 97.4 02/08/2013 0400   MCH 30.9 02/08/2013 0400  MCHC 31.7 02/08/2013 0400   RDW 16.8* 02/08/2013 0400   LYMPHSABS 1.5 01/06/2013 0753   MONOABS 0.8 01/06/2013 0753   EOSABS 0.5 01/06/2013 0753   BASOSABS 0.1 01/06/2013 0753     Assessment:  1.  SP CABG and R CAE 2. Hypotension, improved 3. Anemia on aranesp 4. 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 DM 6. ESRD  Plan: 1. Plan HD tomorrow and cont to decrease DW as tolerated 2.  Needs to improve strength with PT   Hendricks Schwandt T

## 2013-02-08 NOTE — Evaluation (Signed)
Physical Therapy Evaluation Patient Details Name: Nicholas [name] MRN: [id number] DOB: 1941-05-29 Today's Date: 02/08/2013 Time: 7846-9629 PT Time Calculation (min): 44 min  PT Assessment / Plan / Recommendation Clinical Impression  72 y.o. male admitted to [facility] s/p CABG (5 days ago).  He presents today with decreased endurance, decreased leg strength, decreased O2 tolerance of mobility.  He lives alone and is unable to get 24 hour assist from family at discharge.  He would benefit from a rehab stay after discahrge from acute hospital setting.  We discussed at length inpatient rehab vs SNF for rehab.  The pt's biggest barrier other than his physical mobility to getting home is getting transport to dialysis.  His brother reports that they live in [facility] and that there is some strage situation where the pt is unable to get medical transport to his dialysis center.  Pt's biggest concern is that he would go to inpatient rehab and be sent home before he is allowed to drive and then he would not be able to get to dialysis.  I would recommend inpatient rehab as long as the social issue of transport to dialysis can be worked out through social work on the inpatient rehab unit.      PT Assessment  Patient needs continued PT services    Follow Up Recommendations  CIR    Does the patient have the potential to tolerate intense rehabilitation     yes  Barriers to Discharge Decreased caregiver support;Other (comment) (see comments about transport to dialysis) Pt will not be allowed to drive for a while after this surgery and will need to be able to get back and forth to dialysis.      Equipment Recommendations  None recommended by PT (tub transfer bench?)    Recommendations for Other Services Rehab consult   Frequency Min 3X/week    Precautions / Restrictions Precautions Precautions: Sternal;Posterior Hip Precaution Comments: h/o bil hip replacements   Pertinent Vitals/Pain O2 sats on RA at  rest 80%, Ambulated with 3 L O2 [place] with DOE 2/4, O2 sats in mid 80s with gait.  HR 105 bpm during gait.       Mobility  Bed Mobility Bed Mobility: Supine to Sit;Sitting - Scoot to Edge of Bed Supine to Sit: 4: Min assist Sitting - Scoot to [place] of Bed: 4: Min assist Details for Bed Mobility Assistance: min assist to support trunk during transitions, verbal cues to not push or pull due to sternal precautions.   Transfers Transfers: Sit to Stand;Stand to Sit Sit to Stand: 3: Mod assist;From bed Stand to Sit: 3: Mod assist;To chair/3-in-1 Details for Transfer Assistance: mod assist to support trunk to get to standing.  Verbal cues to push up from his knees, pt unable to get to standing without using his upper extremities.   Ambulation/Gait Ambulation/Gait Assistance: 4: Min assist Ambulation Distance (Feet): 45 Feet Assistive device: Rolling walker Ambulation/Gait Assistance Details: min assist to support trunk over weak legs.   Gait Pattern: Step-through pattern;Trunk flexed Gait velocity: less than 1.8 ft/sec putting him at risk for recurrent falls General Gait Details: Pt needed O2 for gait due to O2 sats at rest on RA 80%.  3 L O2 [place] used for gait DOE 2/4 O2 sats even on O2 still dropped into the 80s.      Exercises Other Exercises Other Exercises: incintinve spirometer x 5 reps up to 1,000 mL max volume.     PT Diagnosis: Difficulty walking;Abnormality of gait;Generalized  weakness  PT Problem List: Decreased strength;Decreased activity tolerance;Decreased balance;Decreased mobility;Decreased knowledge of precautions;Cardiopulmonary status limiting activity PT Treatment Interventions: DME instruction;Gait training;Stair training;Functional mobility training;Therapeutic activities;Therapeutic exercise;Balance training;Neuromuscular re-education;Patient/family education   PT Goals Acute Rehab PT Goals PT Goal Formulation: With patient Time For Goal Achievement: 02/22/13 Potential to  Achieve Goals: Good Pt will go Supine/Side to Sit: with modified independence;with HOB 0 degrees PT Goal: Supine/Side to Sit - Progress: Goal set today Pt will go Sit to Supine/Side: with modified independence;with HOB 0 degrees PT Goal: Sit to Supine/Side - Progress: Goal set today Pt will go Sit to Stand: with modified independence PT Goal: Sit to Stand - Progress: Goal set today Pt will go Stand to Sit: with modified independence PT Goal: Stand to Sit - Progress: Goal set today Pt will Ambulate: >150 feet;with modified independence;with rolling walker PT Goal: Ambulate - Progress: Goal set today Pt will Go Up / Down Stairs: 3-5 stairs;with modified independence;with rail(s) PT Goal: Up/Down Stairs - Progress: Goal set today Additional Goals Additional Goal #1: Pt will demonstrate compliance with sternal precautions without cueing 100% of session.   PT Goal: Additional Goal #1 - Progress: Goal set today  Visit Information  Last PT Received On: 02/08/13 Assistance Needed: +1    Subjective Data  Subjective: Pt reports that if he is not allowed to drive he doesn't have transport to and from dialysis.   Patient Stated Goal: to go home after some form of rehab   Prior Functioning  Home Living Lives With: Alone Available Help at Discharge: Family;Available PRN/intermittently Type of Home: House Home Access: Stairs to enter [employer] of Steps: 3 Entrance Stairs-Rails: Right;Left Home Layout: One level Home Adaptive Equipment: Walker - four wheeled;Straight cane;Bedside commode/3-in-1 Additional Comments: used the cane in the community and walker in the house.   Prior Function Level of Independence: Independent with assistive device(s) Able to Take Stairs?: Yes Driving: Yes Communication Communication: No difficulties Dominant Hand: Right    Cognition  Cognition Arousal/Alertness: Awake/alert Behavior During Therapy: WFL for tasks assessed/performed Overall  Cognitive Status: Within Functional Limits for tasks assessed          End of Session PT - End of Session Equipment Utilized During Treatment: Oxygen Activity Tolerance: Patient limited by fatigue Patient left: in chair;with call bell/phone within reach;with family/visitor present (brother) Nurse Communication: Mobility status (to Hydrologist)    Lurena Joiner B. Jujuan Dugo, PT, DPT [phone]   02/08/2013, 1:33 PM

## 2013-02-08 NOTE — Progress Notes (Signed)
Rehab Admissions Coordinator Note:  Patient was screened by Meryl Dare for appropriateness for an Inpatient Acute Rehab Consult.  Pt's insurance will not approve CIR for this diagnosis. Noted that pt has already talked with [facility] about SNF. At this time, we are recommending Skilled Nursing Facility.  Meryl Dare 02/08/2013, 4:03 PM  I can be reached at [phone].

## 2013-02-08 NOTE — Plan of Care (Signed)
Problem: Phase III Progression Outcomes Goal: Transfer to PCTU/Telemetry POD Outcome: Completed/Met Date Met:  02/08/13 POD 4.

## 2013-02-08 NOTE — Progress Notes (Signed)
[facility] Progress Note  02/08/2013 8:18 AM 5 Days Post-Op  Subjective:  No complaints this am  Afebrile 90's-110's regular 100's-160's systolic 95% RA  Filed Vitals:   02/08/13 0458  BP: 99/62  Pulse: 4  Temp: 98 F (36.7 C)  Resp: 18     Physical Exam: Neuro:  In tact Incision:  C/d/i with minimal ecchymosis  CBC    Component Value Date/Time   WBC 8.4 02/08/2013 0400   RBC 2.33* 02/08/2013 0400   HGB 7.2* 02/08/2013 0400   HCT 22.7* 02/08/2013 0400   PLT 234 02/08/2013 0400   MCV 97.4 02/08/2013 0400   MCH 30.9 02/08/2013 0400   MCHC 31.7 02/08/2013 0400   RDW 16.8* 02/08/2013 0400   LYMPHSABS 1.5 01/06/2013 0753   MONOABS 0.8 01/06/2013 0753   EOSABS 0.5 01/06/2013 0753   BASOSABS 0.1 01/06/2013 0753    BMET    Component Value Date/Time   NA 138 02/08/2013 0400   K 3.3* 02/08/2013 0400   CL 95* 02/08/2013 0400   CO2 30 02/08/2013 0400   GLUCOSE 132* 02/08/2013 0400   BUN 19 02/08/2013 0400   CREATININE 2.57* 02/08/2013 0400   CALCIUM 8.5 02/08/2013 0400   GFRNONAA 24* 02/08/2013 0400   GFRAA 28* 02/08/2013 0400     Intake/Output Summary (Last 24 hours) at 02/08/13 0818 Last data filed at 02/07/13 1552  Gross per 24 hour  Intake      0 ml  Output   2971 ml  Net  -2971 ml      Assessment/Plan:  This is a 72 y.o. male who is s/p right CABG/CEA 5 Days Post-Op  -pt is doing well this am. -pt neuro exam is in tact -anemia per primary team-stable -f/u with Dr. Hart Rochester in 2 weeks.   Doreatha Massed, PA-C [facility] [phone]

## 2013-02-08 NOTE — Plan of Care (Signed)
Problem: Phase III Progression Outcomes Goal: Time patient transferred to PCTU/Telemetry POD Outcome: Completed/Met Date Met:  02/08/13 2150

## 2013-02-08 NOTE — Progress Notes (Addendum)
[address]       [place] [address]             [phone]          5 Days Post-Op Procedure(s) (LRB): CORONARY ARTERY BYPASS GRAFTING (CABG) (N/A) ENDARTERECTOMY CAROTID (N/A)  Subjective: Nonproductive cough.  Breathing stable, but still on 4L.  +BM after enema.   Objective: Vital signs in last 24 hours: Patient Vitals for the past 24 hrs:  BP Temp Temp src Pulse Resp SpO2 Weight  02/08/13 0458 99/62 mmHg 98 F (36.7 C) Oral 4 18 93 % 171 lb 15.3 oz (78 kg)  02/07/13 2042 153/83 mmHg 99.8 F (37.7 C) Oral 115 18 99 % 171 lb 15.3 oz (78 kg)  02/07/13 2000 136/70 mmHg 98.6 F (37 C) Oral 114 24 97 % -  02/07/13 1900 135/106 mmHg - - 114 20 95 % -  02/07/13 1800 158/78 mmHg - - 126 23 97 % -  02/07/13 1700 128/72 mmHg - - 106 24 100 % -  02/07/13 1552 139/84 mmHg 98.3 F (36.8 C) Oral 113 20 100 % 182 lb 1.6 oz (82.6 kg)  02/07/13 1530 132/85 mmHg - - 111 16 - -  02/07/13 1500 149/84 mmHg - - 110 20 - -  02/07/13 1430 154/89 mmHg - - 108 16 - -  02/07/13 1400 150/86 mmHg - - 108 19 - -  02/07/13 1330 151/87 mmHg - - 107 23 - -  02/07/13 1300 161/87 mmHg - - 105 19 - -  02/07/13 1231 144/61 mmHg - - 100 22 - -  02/07/13 1212 135/79 mmHg - - 98 14 - -  02/07/13 1210 133/77 mmHg - - 98 19 - -  02/07/13 1157 142/81 mmHg 98.2 F (36.8 C) Oral 100 14 95 % 189 lb 9.5 oz (86 kg)  02/07/13 1142 - 98.5 F (36.9 C) Oral - - - -  02/07/13 1100 162/92 mmHg - - 101 18 100 % -  02/07/13 1000 114/64 mmHg - - 91 12 98 % -  02/07/13 0900 139/62 mmHg - - 105 22 86 % -   Current Weight  02/08/13 171 lb 15.3 oz (78 kg)  PRE-OPERATIVE WEIGHT: 81kg    Intake/Output from previous day: 06/23 0701 - 06/24 0700 In: 240 [P.O.:240] Out: 2971   CBGs 115-138-132-124-141  PHYSICAL EXAM:  Heart: RRR Lungs: Dull in L base, few scattered crackles Wound: Clean and dry Extremities: Mild LE edema Neuro: intact    Lab Results: CBC: Recent Labs   02/07/13 0343 02/08/13 0400  WBC 7.5 8.4  HGB 7.0* 7.2*  HCT 22.0* 22.7*  PLT 184 234   BMET:  Recent Labs  02/07/13 0343 02/08/13 0400  NA 138 138  K 3.1* 3.3*  CL 99 95*  CO2 31 30  GLUCOSE 73 132*  BUN 17 19  CREATININE 2.61* 2.57*  CALCIUM 8.5 8.5    PT/INR: No results found for this basename: LABPROT, INR,  in the last 72 hours  CXR: Findings: Continued left pleural effusion with diffuse left lung  airspace disease and elevation of the left hemidiaphragm.  Cardiomegaly with vascular congestion. No confluent opacity on the  right. No change.  IMPRESSION:  No significant change   Assessment/Plan: S/P Procedure(s) (LRB): CORONARY ARTERY BYPASS GRAFTING (CABG) (N/A) ENDARTERECTOMY CAROTID (N/A)  CV- maintaining SR.  BPs elevated.  Continue current meds for now.  ESRD- per renal.  Pulm- stable  L effusion.  Still on 4L O2.  May need to consider thoracentesis in future.    Hypokalemia- slightly improved.  Defer to renal.  DM- sugars stable on Levemir.   Deconditioning- pt not moving much, although he wasn't very mobile pre-op.  Will order PT eval.   LOS: 5 days    Nicholas [name],Nicholas [name] 02/08/2013  He looks much better today. More alert and interactive.  He had questions about rehab v SNF- I think either would be an appropriate intermediate step  His left hemidiaphragm is elevated which accounts for most of the LLL atelectasis- I think the actual effusion is relatively small. Hopefully, this will recover with time but it often takes 6-9 months.

## 2013-02-08 NOTE — Progress Notes (Signed)
CARDIAC REHAB PHASE I   PRE:  Rate/Rhythm: 98SR  BP:  Supine:   Sitting: 108/52  Standing:    SaO2: 96%3L  MODE:  Ambulation: 150 ft   POST:  Rate/Rhythm: 114 ST  BP:  Supine:   Sitting: 121/66  Standing:    SaO2: 96%4L 1305-1345 Pt walked 150 ft on 4L with gait belt use, rollator and asst x 2. Pt took many standing rest breaks but did not need to sit. Tired by end of walk but tolerated well. Very motivated to walk. Call bell in reach. To recliner. Put back to 3L.   Luetta Nutting, RN BSN  02/08/2013 1:42 PM

## 2013-02-09 DIAGNOSIS — Z9889 Other specified postprocedural states: Secondary | ICD-10-CM

## 2013-02-09 DIAGNOSIS — Z951 Presence of aortocoronary bypass graft: Secondary | ICD-10-CM

## 2013-02-09 LAB — CBC
Hemoglobin: 7.3 g/dL — ABNORMAL LOW (ref 13.0–17.0)
MCHC: 31.3 g/dL (ref 30.0–36.0)
RBC: 2.44 MIL/uL — ABNORMAL LOW (ref 4.22–5.81)
WBC: 9.5 10*3/uL (ref 4.0–10.5)

## 2013-02-09 LAB — GLUCOSE, CAPILLARY
Glucose-Capillary: 148 mg/dL — ABNORMAL HIGH (ref 70–99)
Glucose-Capillary: 107 mg/dL — ABNORMAL HIGH (ref 70–99)

## 2013-02-09 LAB — RENAL FUNCTION PANEL
Chloride: 92 mEq/L — ABNORMAL LOW (ref 96–112)
GFR calc Af Amer: 17 mL/min — ABNORMAL LOW (ref >90)
Glucose, Bld: 138 mg/dL — ABNORMAL HIGH (ref 70–99)
Phosphorus: 2.8 mg/dL (ref 2.3–4.6)
Potassium: 3.7 mEq/L (ref 3.5–5.1)
Sodium: 135 mEq/L (ref 135–145)

## 2013-02-09 MED ORDER — METOPROLOL TARTRATE 12.5 MG HALF TABLET: 12.5000 mg | ORAL_TABLET | Freq: Two times a day (BID) | ORAL | Status: DC

## 2013-02-09 MED ORDER — TRAMADOL HCL 50 MG PO TABS: 50.0000 mg | ORAL_TABLET | Freq: Two times a day (BID) | ORAL | Status: DC | PRN

## 2013-02-09 NOTE — Progress Notes (Addendum)
      [address]       [place] [address]             [phone]        6 Days Post-Op Procedure(s) (LRB): CORONARY ARTERY BYPASS GRAFTING (CABG) (N/A) ENDARTERECTOMY CAROTID (N/A)  Subjective: Patient eating breakfast. Only complaint is cough and some burning mid sternal incision  Objective: Vital signs in last 24 hours: Temp:  [98.3 F (36.8 C)-99 F (37.2 C)] 99 F (37.2 C) (06/25 0612) Pulse Rate:  [78-100] 100 (06/25 0612) Cardiac Rhythm:  [-] Normal sinus rhythm (06/25 0728) Resp:  [17-18] 17 (06/25 0612) BP: (115-131)/(66-81) 131/81 mmHg (06/25 0612) SpO2:  [95 %-100 %] 96 % (06/25 0612) Weight:  [81.7 kg (180 lb 1.9 oz)] 81.7 kg (180 lb 1.9 oz) (06/25 0612)  Pre op weight  81 kg Current Weight  02/09/13 81.7 kg (180 lb 1.9 oz)      Intake/Output from previous day: 06/24 0701 - 06/25 0700 In: 360 [P.O.:360] Out: -    Physical Exam:  Cardiovascular: RRR Pulmonary: Diminished at left base; no rales, wheezes, or rhonchi. Abdomen: Soft, non tender, bowel sounds present. Extremities: Trace bilateral lower extremity edema. Wounds: Clean and dry.  No erythema or signs of infection.  Lab Results: CBC: Recent Labs  02/07/13 0343 02/08/13 0400  WBC 7.5 8.4  HGB 7.0* 7.2*  HCT 22.0* 22.7*  PLT 184 234   BMET:  Recent Labs  02/07/13 0343 02/08/13 0400  NA 138 138  K 3.1* 3.3*  CL 99 95*  CO2 31 30  GLUCOSE 73 132*  BUN 17 19  CREATININE 2.61* 2.57*  CALCIUM 8.5 8.5    PT/INR:  Lab Results  Component Value Date   INR 1.48 02/03/2013   INR 1.04 02/01/2013   INR 1.1* 01/04/2013   ABG:  INR: Will add last result for INR, ABG once components are confirmed Will add last 4 CBG results once components are confirmed  Assessment/Plan:  1. CV - SR. On Amiodarone 200 daily. Hypotension improved, questionable ok to start Lopressor. 2.  Pulmonary - Down to 3 liters of oxygen via [place]-continue to wean as tolerates.Encourage  incentive spirometer and flutter valve 3.  Acute blood loss anemia - Last H and H 7.2 and 22.7. Likely related to ESRD and ABL anemia. On Aranesp. 4.DM- CBGs  164/134/119. On Levemir 20 units.Pre op HGA1C 5.6. 5.ESRD- Last creatinine 2.57. Per nephrology, HD today 6.Remove EPW in am 7.Robitussin DM PRN for cough 8.Will need SNF when ready for discharge  ZIMMERMAN,DONIELLE MPA-C 02/09/2013,7:32 AM  Patient seen and examined while on dialysis Agree with above

## 2013-02-09 NOTE — Progress Notes (Signed)
S: Up walking halls.  Appetite marginal O:BP 131/81  Pulse 100  Temp(Src) 99 F (37.2 C) (Oral)  Resp 17  Ht 5\' 2"  (1.575 m)  Wt 81.7 kg (180 lb 1.9 oz)  BMI 32.94 kg/m2  SpO2 96%  Intake/Output Summary (Last 24 hours) at 02/09/13 0729 Last data filed at 02/08/13 1300  Gross per 24 hour  Intake    360 ml  Output      0 ml  Net    360 ml   Weight change: -4.3 kg (-9 lb 7.7 oz) ZOX:WRUEA and alert CVS:RRR Resp:decreased BS Lt base Abd:+BS NTND VWU:JWJXB edema.  L AVF + bruit  Large Rt olecranon bursa NEURO:CNI Ox3 no asterixis   . allopurinol  100 mg Oral BID  . amiodarone  200 mg Oral Daily  . aspirin EC  325 mg Oral Daily  . bisacodyl  10 mg Oral Daily   Or  . bisacodyl  10 mg Rectal Daily  . [START ON 02/11/2013] darbepoetin (ARANESP) injection - DIALYSIS  100 mcg Intravenous Q Fri-HD  . docusate sodium  200 mg Oral Daily  . enoxaparin (LOVENOX) injection  30 mg Subcutaneous QHS  . gabapentin  600 mg Oral BID  . insulin aspart  0-24 Units Subcutaneous Once  . insulin aspart  0-9 Units Subcutaneous TID WC  . insulin aspart  3 Units Subcutaneous TID WC  . insulin detemir  20 Units Subcutaneous Q1200  . multivitamin  1 tablet Oral Daily  . pantoprazole  40 mg Oral Daily  . sevelamer carbonate  800 mg Oral TID WC  . sodium chloride  3 mL Intravenous Q12H  . tamsulosin  0.4 mg Oral QHS   Dg Chest 2 View  02/08/2013   *RADIOLOGY REPORT*  Clinical Data: Pleural effusion.  Post CABG.  CHEST - 2 VIEW  Comparison: 02/10/2013.  Findings: Continued left pleural effusion with diffuse left lung airspace disease and elevation of the left hemidiaphragm. Cardiomegaly with vascular congestion.  No confluent opacity on the right.  No change.  IMPRESSION: No significant change.   Original Report Authenticated By: Charlett Nose, M.D.   BMET    Component Value Date/Time   NA 138 02/08/2013 0400   K 3.3* 02/08/2013 0400   CL 95* 02/08/2013 0400   CO2 30 02/08/2013 0400   GLUCOSE 132*  02/08/2013 0400   BUN 19 02/08/2013 0400   CREATININE 2.57* 02/08/2013 0400   CALCIUM 8.5 02/08/2013 0400   GFRNONAA 24* 02/08/2013 0400   GFRAA 28* 02/08/2013 0400   CBC    Component Value Date/Time   WBC 8.4 02/08/2013 0400   RBC 2.33* 02/08/2013 0400   HGB 7.2* 02/08/2013 0400   HCT 22.7* 02/08/2013 0400   PLT 234 02/08/2013 0400   MCV 97.4 02/08/2013 0400   MCH 30.9 02/08/2013 0400   MCHC 31.7 02/08/2013 0400   RDW 16.8* 02/08/2013 0400   LYMPHSABS 1.5 01/06/2013 0753   MONOABS 0.8 01/06/2013 0753   EOSABS 0.5 01/06/2013 0753   BASOSABS 0.1 01/06/2013 0753     Assessment:  1.  SP CABG and R CAE 2. Hypotension, improved 3. Anemia on aranesp 4. 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 DM 6. ESRD  Plan: 1. HD today 2.  Appears SNF is his only option and he is OK with this  Equan Cogbill T

## 2013-02-09 NOTE — Progress Notes (Signed)
CARDIAC REHAB PHASE I   PRE:  Rate/Rhythm: 98SR  BP:  Supine:   Sitting: 107/66  Standing:    SaO2: 93%RA  MODE:  Ambulation: 150 ft   POST:  Rate/Rhythm: 116 ST  BP:  Supine:   Sitting: 143/68  Standing:    SaO2: 87%RA  99%2L 1100-1125 Pt walked 150 ft on RA with gait belt, rollator and asst x 2. Stopped several times to rest. Desat on RA with some SOB. Put on 2L room to get sats up. Pt using IS at 750 ml. Back to recliner after walk.   Luetta Nutting, RN BSN  02/09/2013 11:25 AM

## 2013-02-09 NOTE — Discharge Summary (Signed)
Physician Discharge Summary       [address]       [name] [address]             [phone]      Patient ID: Nicholas [name] MRN: [id number] DOB/AGE: [birth year] 72 y.o.  Admit date: 02/03/2013 Discharge date: 02/15/2013  Admission Diagnoses:  Patient Active Problem List   Diagnosis Date Noted  . S/P carotid endarterectomy 02/09/2013  . S/P CABG x 5 02/09/2013  . Occlusion and stenosis of carotid artery without mention of cerebral infarction 01/27/2013  . CAD (coronary artery disease) 01/18/2013  . Carotid artery disease 11/30/2012  . Acute systolic HF (heart failure) 10/23/2012  . PAF (paroxysmal atrial fibrillation) 10/22/2012  . Ischemic cardiomyopathy 10/22/2012  . ACS (acute coronary syndrome) 10/20/2012  . HCAP (healthcare-associated pneumonia) 10/20/2012  . Acute respiratory failure with hypoxia 10/20/2012  . Acute blood loss anemia 10/20/2012  . CKD (chronic kidney disease) 10/20/2012  . Acute upper GI hemorrhage 10/20/2012  . DM (diabetes mellitus) 10/20/2012  . Hyperglycemia 10/20/2012   Discharge Diagnoses:   Patient Active Problem List   Diagnosis Date Noted  . S/P carotid endarterectomy 02/09/2013  . S/P CABG x 5 02/09/2013  . Occlusion and stenosis of carotid artery without mention of cerebral infarction 01/27/2013  . CAD (coronary artery disease) 01/18/2013  . Carotid artery disease 11/30/2012  . Acute systolic HF (heart failure) 10/23/2012  . PAF (paroxysmal atrial fibrillation) 10/22/2012  . Ischemic cardiomyopathy 10/22/2012  . ACS (acute coronary syndrome) 10/20/2012  . HCAP (healthcare-associated pneumonia) 10/20/2012  . Acute respiratory failure with hypoxia 10/20/2012  . Acute blood loss anemia 10/20/2012  . CKD (chronic kidney disease) 10/20/2012  . Acute upper GI hemorrhage 10/20/2012  . DM (diabetes mellitus) 10/20/2012  . Hyperglycemia 10/20/2012   Discharged Condition: good  History of Present Illness:   Mr.  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  Earlier this year patient was undergoing workup for his Right Carotid Stenosis.  He underwent Myoview testing which showed a EF of 55% and no ischemia.  However, prior to surgery the patient developed pneumonia and respiratory failure requiring admission to the hospital.  Also at that time he was found to be in Atrial Fibrillation with RVR.  This hospital course was further complicated with development of a GI bleed and Acute Myocardial Infarction.  He underwent Echocardiogram during that hospitalization which showed evidence of Mitral Regurgitation and a reduced EF.  The patient was discharged home and followed up with Cardiology on an outpatient basis.  Repeat Myoview was obtained which showed an inferior defect consistent with previous Myocardial infarction.  Further workup was completed and cardiac catheterization showed severe 3 vessel CAD.  It was felt the patient would best be treated with Coronary Bypass Grafting.  He was referred to [facility] for further evaluation.  Nicholas [name] evaluated the patient on 01/18/2013 at which time the patient states he was doing better.  He denied any episodes of chest pain or shortness of breath.  It was felt the patient would be a candidate for surgery.  However it was felt due to his known history of Carotid Artery Stenosis the patient should undergo repeat Duplex Study.  The patient was evaluated by Dr. Hart [name] on 01/27/2013 at which time duplex study showed a high grade right internal carotid stenosis.  It was  felt the patient would benefit from Endarterectomy.  This was going to be performed at during his Coronary bypass procedure.  The risks and benefits of the procedure were explained to the patient and he was agreeable to proceed.     Hospital Course:   Nicholas [name] presented to [facility] on 02/03/2013.  He was taken to the operating room and underwent Right Carotid Endarterectomy, CABG x 5 utilizing LIMA to LAD, SVG to Dx1, Sequential SVG to OM1 and OM2, and SVG to PDA.  He also underwent Endoscopic Saphenous vein harvest from his left leg.  The patient tolerated the procedure well and was taken to the SICU in stable condition.  During his stay in the ICU the patient was weaned of Phenylephrine and Neo-Synephrine as tolerated.  He was extubated the day after surgery once weaning parameters were met.  Nephrology consult was obtained and patient was resumed on HD.  They have monitored the patient throughout his hospital stay.  His chest tubes and arterial lines were removed without difficulty.  The patient experience gastric dilatation and was treated with Reglan.  His diet was advanced as tolerated.  He had continued issues with hypotension and was not placed on a Beta Blocker.  Once medically stable, he was transferred to the stepdown unit in stable condition.  The patient is progressing slowly.  He is somewhat deconditioned.  He has been evaluated by Physical Therapy who recommended inpatient rehab.  However, the patient's insurance did not approve this so we will arrange post operative SNF for discharge.  The patient continues to require increased levels of oxygen.  This will be weaned down as tolerated.  However, the patient will likely require home oxygen.  His CXR does reveal an elevated Left Hemidiaphragm which can attribute to this.  He did have a gout exacerbation in his left knee and was placed on colchicine. The acute episode did resolve.He will be maintained on Allopurinol. The patient is maintaining NSR and is pacing wires have been removed.  He is ambulating with assistance and tolerating a renal diet.  Should no further issues arise we anticipate discharge in the next 24-48 hours.  The patient will follow up with Nicholas [name] in 3 weeks with a CXR prior to his  appointment.  He will need to follow up with Vascular surgery at their recommendations.  Finally he will need to follow up with Dr. Elease Hashimoto in 2-4 weeks.        Consults: nephrology and vascular surgery  Significant Diagnostic Studies: Angiography:  Hemodynamic Findings:  Central aortic pressure: 161/72  Left ventricular pressure: 155/8/13  Angiographic Findings:  Left main: No obstructive disease.  Left Anterior Descending Artery: Moderate to large caliber vessel that courses to the apex. The proximal vessel has mild disease. The mid vessel has diffuse calcific 70% stenosis followed by tandem 95% stenoses. The distal vessel has a discreet 80% stenosis. The diagonal branch is moderate in caliber with a 99% proximal stenosis.  Circumflex Artery: Moderate caliber vessel with 60-70% mid stenosis just before the takeoff of two marginal branches. The first obtuse marginal branch is small to moderate in caliber with proximal 99% stenosis. The second obtuse marginal branch has mild plaque. The continuation of the AV groove Circumflex is small in caliber with mild plaque disease.  Right Coronary Artery: Large caliber, dominant vessel with 40% proximal stenosis, 99% mid stenosis followed by 100% mid occlusion. There is faint filling of the distal vessel via antegrade injection. The  PDA and PLA is also seen to fill by left to right collaterals.  Left Ventricular Angiogram: LVEF= 45-50%.   Carotid Duplex:   Summary:  - The vertebral arteries appear patent with antegrade flow. - Findings consistent with greater than 80 percent stenosis involving the right internal carotid artery. - Findings consistent with less than 39 percent stenosis involving the left internal carotid artery. - ICA/CCA ratio: right = 3.76. left= 0.98. Other specific details can be found in the table(s) above. Prepared and Electronically Authenticated b  Treatments: surgery:   Pre-op Diagnosis: CAD-severe right carotid  stenosis-asymptomatic  Post-op Diagnosis: Coronary Artery Disease; severe right carotid stenosis-asymptomatic  Procedure: Procedure(s):  Right carotid endarterectomy with Dacron patch angioplasty  Median sternotomy, extracorporeal circulation, coronary  artery bypass grafting x5 (left internal mammary artery to the left  anterior descending, saphenous vein graft to first diagonal, sequential  saphenous vein graft to obtuse marginal 1 and 2, saphenous vein graft  to posterior descending), endoscopic vein harvest, left leg.  Disposition: SNF   Future Appointments Provider Department Dept Phone   02/22/2013 8:30 AM Pryor Ochoa, MD [facility] [phone]   03/01/2013 1:45 PM Loreli Slot, MD [facility] [phone]   03/03/2013 1:45 PM Vesta Mixer, MD [facility] Main Office [place]) [phone]      Discharge medication:    Medication List    STOP taking these medications       aspirin 81 MG chewable tablet  Replaced by:  aspirin 325 MG EC tablet     metoprolol succinate 50 MG 24 hr tablet  Commonly known as:  TOPROL-XL      TAKE these medications       acetaminophen 500 MG tablet  Commonly known as:  TYLENOL  Take 1 tablet (500 mg total) by mouth every 4 (four) hours as needed.     allopurinol 100 MG tablet  Commonly known as:  ZYLOPRIM  Take 100 mg by mouth 2 (two) times daily.     amiodarone 200 MG tablet  Commonly known as:  PACERONE  Take 200 mg by mouth daily.     aspirin 325 MG EC tablet  Take 1 tablet (325 mg total) by mouth daily.     darbepoetin 100 MCG/0.5ML Soln  Commonly known as:  ARANESP  Inject 0.5 mLs (100 mcg total) into the vein every Friday with hemodialysis.     fish oil-omega-3 fatty acids 1000 MG capsule  Take 1 g by mouth 2 (two) times daily.     gabapentin 300 MG capsule  Commonly known as:  NEURONTIN  Take 1 capsule (300 mg total) by mouth 2  (two) times daily.     insulin aspart protamine- aspart (70-30) 100 UNIT/ML injection  Commonly known as:  NOVOLOG MIX 70/30  Inject 2-10 Units into the skin 3 (three) times daily as needed (Per sliding scale, based on sugar levels).     insulin glargine 100 UNIT/ML injection  Commonly known as:  LANTUS  Inject 15 Units into the skin at bedtime.     lidocaine-prilocaine cream  Commonly known as:  EMLA  Apply 1 application topically every Monday, Wednesday, and Friday.     metoprolol tartrate 12.5 mg Tabs  Commonly known as:  LOPRESSOR  Take 0.5 tablets (12.5 mg total) by mouth 2 (two) times daily.     multivitamin Tabs tablet  Take 1 tablet by mouth at bedtime.     multivitamin with  minerals tablet  Take 1 tablet by mouth daily.     pantoprazole 40 MG tablet  Commonly known as:  PROTONIX  Take 40 mg by mouth daily.     sevelamer carbonate 800 MG tablet  Commonly known as:  RENVELA  Take 800 mg by mouth 3 (three) times daily with meals.     tamsulosin 0.4 MG Caps  Commonly known as:  FLOMAX  Take 0.4 mg by mouth at bedtime.     traMADol 50 MG tablet  Commonly known as:  ULTRAM  Take 1 tablet (50 mg total) by mouth every 12 (twelve) hours as needed for pain.        HD days are every Monday, Wednesday, Friday  The patient has been discharged on:   1.Beta Blocker:  Yes [ x  ]                              No   [   ]                              If No, reason:  2.Ace Inhibitor/ARB: Yes [   ]                                     No  [   x ]                                     If No, reason:ESRD  3.Statin:   Yes [   ]                  No  [  x ]                  If No, reason:Allergy  4.Marlowe Kays:  Yes  [  x ]                  No   [   ]                  If No, reason:   Follow-up Information   Follow up with Josephina Gip, MD In 2 weeks. (Office will call you to arrange your appt (sent))    Contact information:   [address] [place]  [address] [phone]       Follow up with Loreli Slot, MD On 03/01/2013. (Appointment is at 1:45)    Contact information:   [address] [employer] [address] [place] [address] [phone]       Follow up with [facility] On 03/01/2013. (Please get CXR at 12:45)    Contact information:   [place]       Schedule an appointment as soon as possible for a visit with Elyn Aquas., MD. (Please contact office to set up appointment)    Contact information:   [address] [place] [address] [phone]       Signed: Ardelle Balls 02/15/2013, 9:34 AM

## 2013-02-09 NOTE — Procedures (Signed)
Pt seen on HD.  Ap 150 Vp 160. BFR 400.  SBP 111.  Tolerating HD well so far.

## 2013-02-09 NOTE — Progress Notes (Signed)
[facility] has offered SNF bed for patient- can accept tomorrow if stable- FL2 in chart for MD signature-  Reece Levy, MSW, Theresia Majors [phone]

## 2013-02-10 LAB — GLUCOSE, CAPILLARY
Glucose-Capillary: 137 mg/dL — ABNORMAL HIGH (ref 70–99)
Glucose-Capillary: 131 mg/dL — ABNORMAL HIGH (ref 70–99)
Glucose-Capillary: 141 mg/dL — ABNORMAL HIGH (ref 70–99)

## 2013-02-10 MED ORDER — COLCHICINE 0.6 MG PO TABS
0.6000 mg | ORAL_TABLET | Freq: Two times a day (BID) | ORAL | Status: DC
  Administered 2013-02-10 – 2013-02-13 (×8): 0.6 mg via ORAL
  Filled 2013-02-10 (×10): qty 1

## 2013-02-10 MED ORDER — SODIUM CHLORIDE 0.9 % IV SOLN
1020.0000 mg | Freq: Once | INTRAVENOUS | Status: DC
  Filled 2013-02-10 (×2): qty 34

## 2013-02-10 MED ORDER — TRAMADOL HCL 50 MG PO TABS
50.0000 mg | ORAL_TABLET | Freq: Two times a day (BID) | ORAL | Status: DC | PRN
  Administered 2013-02-10 – 2013-02-13 (×6): 50 mg via ORAL
  Filled 2013-02-10 (×7): qty 1

## 2013-02-10 MED ORDER — METOPROLOL TARTRATE 12.5 MG HALF TABLET
12.5000 mg | ORAL_TABLET | Freq: Two times a day (BID) | ORAL | Status: DC
  Administered 2013-02-10 – 2013-02-14 (×6): 12.5 mg via ORAL
  Filled 2013-02-10 (×12): qty 1

## 2013-02-10 MED ORDER — BENZONATATE 100 MG PO CAPS
200.0000 mg | ORAL_CAPSULE | Freq: Three times a day (TID) | ORAL | Status: DC | PRN
  Administered 2013-02-10 – 2013-02-12 (×5): 200 mg via ORAL
  Filled 2013-02-10 (×6): qty 2

## 2013-02-10 NOTE — Progress Notes (Signed)
02/10/2013 11:00 AM EPW and CTS removed per MD order and unit protocol.  No ectopy noted on the monitor.  Steri strips x 2 placed to each CT incision.  Pt tolerated well and all tips of EPW intact.  Pt instructed to remain on bedrest x 1 hour. Pt verbalized understanding of these instructions. Will continue to monitor closely. Tawn Fitzner, Avie Echevaria , RN

## 2013-02-10 NOTE — Progress Notes (Addendum)
      [address]       [place] [address]             [phone]        7 Days Post-Op Procedure(s) (LRB): CORONARY ARTERY BYPASS GRAFTING (CABG) (N/A) ENDARTERECTOMY CAROTID (N/A)  Subjective: Patient "wiped out" after HD yesterday. Still with non productive cough. Had bleeding from proximal sternal wound after coughing spell last evening.  Objective: Vital signs in last 24 hours: Temp:  [97.6 F (36.4 C)-100 F (37.8 C)] 100 F (37.8 C) (06/26 0406) Pulse Rate:  [99-116] 102 (06/26 0406) Cardiac Rhythm:  [-] Normal sinus rhythm;Sinus tachycardia (06/25 2023) Resp:  [16-23] 18 (06/26 0406) BP: (91-132)/(66-80) 118/75 mmHg (06/26 0406) SpO2:  [94 %-98 %] 94 % (06/26 0406) Weight:  [79.1 kg (174 lb 6.1 oz)-81.3 kg (179 lb 3.7 oz)] 79.1 kg (174 lb 6.1 oz) (06/26 0406)  Pre op weight  81 kg Current Weight  02/10/13 79.1 kg (174 lb 6.1 oz)      Intake/Output from previous day: 06/25 0701 - 06/26 0700 In: 240 [P.O.:240] Out: 2800    Physical Exam:  Cardiovascular: RRR Pulmonary: Diminished at left base; no rales, wheezes, or rhonchi. Abdomen: Soft, non tender, bowel sounds present. Extremities: Trace bilateral lower extremity edema. Wounds: Sero sanguinous ooze proximal sternal wound. No erythema. Lower extremity wounds clean and dry.  Lab Results: CBC:  Recent Labs  02/08/13 0400 02/09/13 1319  WBC 8.4 9.5  HGB 7.2* 7.3*  HCT 22.7* 23.3*  PLT 234 269   BMET:   Recent Labs  02/08/13 0400 02/09/13 1320  NA 138 135  K 3.3* 3.7  CL 95* 92*  CO2 30 28  GLUCOSE 132* 138*  BUN 19 32*  CREATININE 2.57* 3.92*  CALCIUM 8.5 8.7    PT/INR:  Lab Results  Component Value Date   INR 1.48 02/03/2013   INR 1.04 02/01/2013   INR 1.1* 01/04/2013   ABG:  INR: Will add last result for INR, ABG once components are confirmed Will add last 4 CBG results once components are confirmed  Assessment/Plan:  1. CV - ST. On Amiodarone 200 daily.  Hypotension resolved so will start low dose Lopressor for better HR control. 2.  Pulmonary - Down to 2.5 liters of oxygen via [place]. Will likely need O2 at discharge.Encourage incentive spirometer and flutter valve 3.  Acute blood loss anemia - Last H and H 7.2 and 22.7. Likely related to ESRD and ABL anemia. On Aranesp. 4.DM- CBGs 107/131/137. On Levemir 20 units.Pre op HGA1C 5.6. 5.ESRD- Had HD yesterday 6.Remove EPW 7.Robitussin DM PRN for cough 8.Will need SNF when ready for discharge  ZIMMERMAN,DONIELLE MPA-C 02/10/2013,7:36 AM  Complaining of a gout flare in his left knee. Unable to stand on it this AM Will add colchicine, he's already on allopurinol Dc EPW-   Has a bed at [facility] when he's ready for dc but I don't think he's ready today given gout flare

## 2013-02-10 NOTE — Progress Notes (Signed)
1500  Offered to walk with pt. Declined due to gout pain in knee. Stated he could not even lift leg while in bed. Luetta Nutting RNBSN

## 2013-02-10 NOTE — Progress Notes (Signed)
Clinical Social Work Department BRIEF PSYCHOSOCIAL ASSESSMENT 02/10/2013  Patient:  Nicholas [name], Nicholas [name]     Account Number:  [id number]     Admit date:  02/03/2013  Clinical Social Worker:  Robin Searing  Date/Time:  02/10/2013 04:07 PM  Referred by:  Physician  Date Referred:  02/09/2013 Referred for  SNF Placement   Other Referral:   Interview type:  Patient Other interview type:    PSYCHOSOCIAL DATA Living Status:  ALONE Admitted from facility:   Level of care:   Primary support name:   Primary support relationship to patient:   Degree of support available:   Patient requesting SNF at dc- states he lives near [place] and I have secured a bed for him there at d/c-    CURRENT CONCERNS Current Concerns  Post-Acute Placement   Other Concerns:    SOCIAL WORK ASSESSMENT / PLAN SNF bed has been selected at [facility] and is set for d/c - they are able to accept him over the weekend if stable-   Assessment/plan status:  Other - See comment Other assessment/ plan:   FL2  [place]   Information/referral to community resources:   SNF  EMS    PATIENT'S/FAMILY'S RESPONSE TO PLAN OF CARE: Patient pleased with bed availability and plans dc/ to [facility] at d/c.     Reece Levy, MSW, Theresia Majors [phone]

## 2013-02-10 NOTE — Progress Notes (Signed)
S: Some bleeding from sternal wound, now stopped O:BP 118/75  Pulse 102  Temp(Src) 100 F (37.8 C) (Oral)  Resp 18  Ht 5\' 2"  (1.575 m)  Wt 79.1 kg (174 lb 6.1 oz)  BMI 31.89 kg/m2  SpO2 94%  Intake/Output Summary (Last 24 hours) at 02/10/13 0720 Last data filed at 02/09/13 1644  Gross per 24 hour  Intake    240 ml  Output   2800 ml  Net  -2560 ml   Weight change: -0.4 kg (-14.1 oz) RUE:AVWUJ and alert CVS:RRR Resp:decreased BS Lt base Abd:+BS NTND WJX:BJYNW edema.  L AVF + bruit  Large Rt olecranon bursa NEURO:CNI Ox3 no asterixis Chest:  Bandage over sternal wound with small amt of dried blood   . allopurinol  100 mg Oral BID  . amiodarone  200 mg Oral Daily  . aspirin EC  325 mg Oral Daily  . bisacodyl  10 mg Oral Daily   Or  . bisacodyl  10 mg Rectal Daily  . [START ON 02/11/2013] darbepoetin (ARANESP) injection - DIALYSIS  100 mcg Intravenous Q Fri-HD  . docusate sodium  200 mg Oral Daily  . enoxaparin (LOVENOX) injection  30 mg Subcutaneous QHS  . gabapentin  600 mg Oral BID  . insulin aspart  0-24 Units Subcutaneous Once  . insulin aspart  0-9 Units Subcutaneous TID WC  . insulin aspart  3 Units Subcutaneous TID WC  . insulin detemir  20 Units Subcutaneous Q1200  . multivitamin  1 tablet Oral Daily  . pantoprazole  40 mg Oral Daily  . sevelamer carbonate  800 mg Oral TID WC  . sodium chloride  3 mL Intravenous Q12H  . tamsulosin  0.4 mg Oral QHS   No results found. BMET    Component Value Date/Time   NA 135 02/09/2013 1320   K 3.7 02/09/2013 1320   CL 92* 02/09/2013 1320   CO2 28 02/09/2013 1320   GLUCOSE 138* 02/09/2013 1320   BUN 32* 02/09/2013 1320   CREATININE 3.92* 02/09/2013 1320   CALCIUM 8.7 02/09/2013 1320   GFRNONAA 14* 02/09/2013 1320   GFRAA 17* 02/09/2013 1320   CBC    Component Value Date/Time   WBC 9.5 02/09/2013 1319   RBC 2.44* 02/09/2013 1319   HGB 7.3* 02/09/2013 1319   HCT 23.3* 02/09/2013 1319   PLT 269 02/09/2013 1319   MCV 95.5  02/09/2013 1319   MCH 29.9 02/09/2013 1319   MCHC 31.3 02/09/2013 1319   RDW 16.5* 02/09/2013 1319   LYMPHSABS 1.5 01/06/2013 0753   MONOABS 0.8 01/06/2013 0753   EOSABS 0.5 01/06/2013 0753   BASOSABS 0.1 01/06/2013 0753     Assessment:  1.  SP CABG and R CAE 2. Hypotension, improved 3. Anemia on aranesp, iron levels low 4. 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 DM 6. ESRD  Plan: 1.IV iron 2.  HD in AM if still here 3.  ? DC to SNF today  Nyaira Hodgens T

## 2013-02-10 NOTE — Progress Notes (Signed)
Clinical Social Work Department CLINICAL SOCIAL WORK PLACEMENT NOTE 02/10/2013  Patient:  Nicholas [name], Nicholas [name]  Account Number:  [id number] Admit date:  02/03/2013  Clinical Social Worker:  Robin Searing  Date/time:  02/10/2013 04:13 PM  Clinical Social Work is seeking post-discharge placement for this patient at the following level of care:   SKILLED NURSING   (*CSW will update this form in Epic as items are completed)   02/09/2013  Patient/family provided with [facility] Department of Clinical Social Work's list of facilities offering this level of care within the geographic area requested by the patient (or if unable, by the patient's family).  02/09/2013  Patient/family informed of their freedom to choose among providers that offer the needed level of care, that participate in Medicare, Medicaid or managed care program needed by the patient, have an available bed and are willing to accept the patient.  02/09/2013  Patient/family informed of [facility]' ownership interest in [facility], as well as of the fact that they are under no obligation to receive care at this facility.  PASARR submitted to EDS on 02/09/2013 PASARR number received from EDS on 02/09/2013  FL2 transmitted to all facilities in geographic area requested by pt/family on  02/10/2013 FL2 transmitted to all facilities within larger geographic area on   Patient informed that his/her managed care company has contracts with or will negotiate with  certain facilities, including the following:     Patient/family informed of bed offers received:  02/09/2013 Patient chooses bed at [facility] [place] Physician recommends and patient chooses bed at    Patient to be transferred to  on   Patient to be transferred to facility by   The following physician request were entered in Epic:   Additional Comments: Reece Levy, MSW, Theresia Majors [phone]

## 2013-02-10 NOTE — Progress Notes (Signed)
Physical Therapy Treatment Patient Details Name: Nicholas [name] MRN: [id number] DOB: [birth year] Today's Date: 02/10/2013 Time: 4098-1191 PT Time Calculation (min): 16 min  PT Assessment / Plan / Recommendation  PT Comments   Pt with reports of gout in Lt knee that began last night.  He required increased assistance for bed mobility today & only able to tolerate sitting EOB for brief period of time.  Per chart review insurance will not approve CIR therefore plans are for SNF at d/c when medically ready.  D/c plans updated.    Follow Up Recommendations  SNF     Does the patient have the potential to tolerate intense rehabilitation     Barriers to Discharge        Equipment Recommendations       Recommendations for Other Services    Frequency Min 3X/week   Progress towards PT Goals Progress towards PT goals: Not progressing toward goals - comment (due to gout)  Plan Discharge plan needs to be updated    Precautions / Restrictions Precautions Precautions: Sternal;Posterior Hip Precaution Comments: h/o bil hip replacements Restrictions Weight Bearing Restrictions:  (sternal precautions)   Pertinent Vitals/Pain Lt knee due to gout    Mobility  Bed Mobility Bed Mobility: Supine to Sit;Sitting - Scoot to [place] of Bed;Sit to Supine;Scooting to [facility] Supine to Sit: 2: Max assist;HOB elevated Sitting - Scoot to [place] of Bed: 2: Max assist Sit to Supine: 2: Max assist Scooting to [facility]: 1: +2 Total assist Scooting to [facility]: Patient Percentage: 0% Details for Bed Mobility Assistance: Increased assistance for all mobility today.  Pt very painful due to gout flare-up in Lt knee.   Transfers Transfers: Not assessed      PT Goals (current goals can now be found in the care plan section) Acute Rehab PT Goals Patient Stated Goal: to go home after some form of rehab  Visit Information  Last PT Received On: 02/10/13 Assistance Needed: +1    Subjective Data  Subjective: Pt reports he has  gout flare up in Lt knee that started last night Patient Stated Goal: to go home after some form of rehab   Cognition  Cognition Arousal/Alertness: Awake/alert Behavior During Therapy: WFL for tasks assessed/performed Overall Cognitive Status: Within Functional Limits for tasks assessed    Balance     End of Session PT - End of Session Equipment Utilized During Treatment: Gait belt Activity Tolerance: Patient limited by pain (gout Lt knee) Patient left: in bed;with call bell/phone within reach    [place], [place] [phone] 02/10/2013

## 2013-02-11 ENCOUNTER — Telehealth: Payer: Self-pay | Admitting: Vascular Surgery

## 2013-02-11 LAB — GLUCOSE, CAPILLARY
Glucose-Capillary: 107 mg/dL — ABNORMAL HIGH (ref 70–99)
Glucose-Capillary: 108 mg/dL — ABNORMAL HIGH (ref 70–99)
Glucose-Capillary: 158 mg/dL — ABNORMAL HIGH (ref 70–99)

## 2013-02-11 LAB — CBC
HCT: 26.9 % — ABNORMAL LOW (ref 39.0–52.0)
Hemoglobin: 8.3 g/dL — ABNORMAL LOW (ref 13.0–17.0)
MCH: 30 pg (ref 26.0–34.0)
MCHC: 30.9 g/dL (ref 30.0–36.0)
MCV: 97.1 fL (ref 78.0–100.0)
RDW: 16.9 % — ABNORMAL HIGH (ref 11.5–15.5)

## 2013-02-11 LAB — RENAL FUNCTION PANEL
Albumin: 3.2 g/dL — ABNORMAL LOW (ref 3.5–5.2)
BUN: 18 mg/dL (ref 6–23)
Calcium: 9 mg/dL (ref 8.4–10.5)
Creatinine, Ser: 2.49 mg/dL — ABNORMAL HIGH (ref 0.50–1.35)
Glucose, Bld: 115 mg/dL — ABNORMAL HIGH (ref 70–99)
Phosphorus: 2.3 mg/dL (ref 2.3–4.6)
Potassium: 4.6 mEq/L (ref 3.5–5.1)

## 2013-02-11 LAB — PREPARE RBC (CROSSMATCH)

## 2013-02-11 MED ORDER — INDOMETHACIN 25 MG PO CAPS
25.0000 mg | ORAL_CAPSULE | Freq: Three times a day (TID) | ORAL | Status: AC
  Administered 2013-02-11 – 2013-02-13 (×5): 25 mg via ORAL
  Filled 2013-02-11 (×8): qty 1

## 2013-02-11 MED ORDER — HYDROCOD POLST-CHLORPHEN POLST 10-8 MG/5ML PO LQCR
5.0000 mL | Freq: Two times a day (BID) | ORAL | Status: DC
  Administered 2013-02-11 – 2013-02-13 (×3): 5 mL via ORAL
  Filled 2013-02-11 (×4): qty 5

## 2013-02-11 MED ORDER — DARBEPOETIN ALFA-POLYSORBATE 100 MCG/0.5ML IJ SOLN
INTRAMUSCULAR | Status: AC
  Administered 2013-02-11: 100 ug via INTRAVENOUS
  Filled 2013-02-11: qty 0.5

## 2013-02-11 NOTE — Procedures (Signed)
Pt seen on HD.  Ap 160 Vp 170.  Still with pain in Lt knee, prob gout flare.  It could be treated as easily at SNF.   Labs pending.

## 2013-02-11 NOTE — Progress Notes (Addendum)
      [address]       [place] [address]             [phone]        8 Days Post-Op Procedure(s) (LRB): CORONARY ARTERY BYPASS GRAFTING (CABG) (N/A) ENDARTERECTOMY CAROTID (N/A)  Subjective: Patient undergoing HD. Still with non productive cough. Does not feel well this am. Has a lot of left knee pain (gout).  Objective: Vital signs in last 24 hours: Temp:  [98.4 F (36.9 C)-98.9 F (37.2 C)] 98.8 F (37.1 C) (06/27 0647) Pulse Rate:  [88-103] 103 (06/27 0800) Cardiac Rhythm:  [-] Normal sinus rhythm;Sinus tachycardia (06/26 1921) Resp:  [17-22] 20 (06/27 0800) BP: (96-139)/(54-75) 110/75 mmHg (06/27 0800) SpO2:  [94 %-98 %] 98 % (06/27 0657) Weight:  [82.2 kg (181 lb 3.5 oz)] 82.2 kg (181 lb 3.5 oz) (06/27 0436)  Pre op weight  81 kg Current Weight  02/11/13 82.2 kg (181 lb 3.5 oz)      Intake/Output from previous day: 06/26 0701 - 06/27 0700 In: 480 [P.O.:480] Out: 250 [Urine:250]   Physical Exam:  Cardiovascular: Slightly tachy Pulmonary: Diminished at left base; no rales, wheezes, or rhonchi. Abdomen: Soft, non tender, bowel sounds present. Extremities: Trace bilateral lower extremity edema. L knee is swollen, warm, and tender Wounds: Sero sanguinous ooze proximal sternal wound. No erythema. Lower extremity wounds clean and dry.  Lab Results: CBC:  Recent Labs  02/09/13 1319  WBC 9.5  HGB 7.3*  HCT 23.3*  PLT 269   BMET:   Recent Labs  02/09/13 1320  NA 135  K 3.7  CL 92*  CO2 28  GLUCOSE 138*  BUN 32*  CREATININE 3.92*  CALCIUM 8.7    PT/INR:  Lab Results  Component Value Date   INR 1.48 02/03/2013   INR 1.04 02/01/2013   INR 1.1* 01/04/2013   ABG:  INR: Will add last result for INR, ABG once components are confirmed Will add last 4 CBG results once components are confirmed  Assessment/Plan:  1. CV - SR/ST. On Amiodarone 200 daily. Lopressor 12.5 bid 2.  Pulmonary - Down to 2.5 liters of oxygen via [place].  Will likely need O2 at discharge.Encourage incentive spirometer and flutter valve 3.  Acute blood loss anemia - Last H and H 7.3 and 23.3 Likely related to ESRD and ABL anemia. On Aranesp. 4.DM- CBGs 141/100/107. On Levemir 20 units.Pre op HGA1C 5.6. 5.ESRD- On HD this am 6.Gout-on Colchicine bid. Will discuss with surgeon if needs additional treament 7.Tessalon PRN for cough 8.Will need SNF when ready for discharge-he may need to stay until Monday as is not able to ambulate secondary to gout  ZIMMERMAN,DONIELLE MPA-C 02/11/2013,8:35 AM  Still having difficulty with gout despite colchicine- will give indocin Still having a lot of coughing- sternum is stable but I'm concerned about the stress on the sternum- tussionex

## 2013-02-11 NOTE — Progress Notes (Signed)
PT Cancellation Note  Patient Details Name: Nicholas [name] MRN: [id number] DOB: 07/19/1941   Cancelled Treatment:    Reason Eval/Treat Not Completed: Patient at procedure or test/unavailable;Pain limiting ability to participate.  Patient in HD in am.  In pm, patient reports continued gout pain and declines PT.  Will return at a later date.   [name] 02/11/2013, 5:39 PM Durenda Hurt. Renaldo Fiddler, [facility] Acute Rehab Services Pager [phone]

## 2013-02-11 NOTE — Telephone Encounter (Signed)
Message copied by Margaretmary Eddy on Fri Feb 11, 2013  2:11 PM ------      Message from: Sharee Pimple      Created: Fri Feb 11, 2013 10:49 AM      Regarding: schedule                   ----- Message -----         From: Dara Lords, PA-C         Sent: 02/08/2013   8:23 AM           To: Sharee Pimple, CMA            S/p right CEA by Dr. Hart [place].  F/u with him in 2 weeks.            Thanks,      Lelon Mast ------

## 2013-02-11 NOTE — Telephone Encounter (Signed)
No answer, no vm, sent letter - kf

## 2013-02-11 NOTE — Progress Notes (Signed)
CARDIAC REHAB PHASE I   PRE:  Rate/Rhythm: 90 SR    BP: sitting 86/53    SaO2: 94 1L  MODE:  Ambulation: 16 ft outside door   POST:  Rate/Rhythm: 102 ST    BP: sitting 96/58     SaO2: 99 2L  Pt c/o left knee pain. Reluctant to walk but willing when encouraged. Leaned forward over rollator. Short distance with assist x2 and 2L. To bed after walk. Felt nauseated after walk but it subsided. Will f/u. [phone]   Elissa Lovett [place] CES, ACSM 02/11/2013 2:36 PM

## 2013-02-11 NOTE — Progress Notes (Signed)
PA on call notified of pt BP 80/50 manual with c/o feeling fatigued.  Pt had HD today.  No new orders.  Pt will remain in bed and RN will continue to monitor BP closely. Will make RN on night shift aware of parameters for lopressor.  Kamarius Buckbee, Avie Echevaria , RN

## 2013-02-12 LAB — GLUCOSE, CAPILLARY: Glucose-Capillary: 89 mg/dL (ref 70–99)

## 2013-02-12 MED ORDER — SODIUM CHLORIDE 0.9 % IV SOLN
1020.0000 mg | Freq: Once | INTRAVENOUS | Status: AC
  Administered 2013-02-12: 1020 mg via INTRAVENOUS
  Filled 2013-02-12 (×2): qty 34

## 2013-02-12 MED ORDER — SODIUM CHLORIDE 0.9 % IV BOLUS (SEPSIS)
250.0000 mL | Freq: Once | INTRAVENOUS | Status: AC
  Administered 2013-02-12: 250 mL via INTRAVENOUS

## 2013-02-12 NOTE — Progress Notes (Signed)
Pt ambulated in hallway 350 with wheelchair on 1 liter of oxygen. Pt took several standing breaks. Oxygen sats were 93 % on 1 liter after ambulating. Will continue to monitor.

## 2013-02-12 NOTE — Progress Notes (Signed)
PA paged and aware of pt's low bp. Pt is asymptomatic. Orders given to hold Amiodarone and Lopressor. Orders given to notify Nephrology concerning pts BP. Nephrology MD notified and aware, orders given to give pt 250 cc NS bolus. Will continue to monitor pt.

## 2013-02-12 NOTE — Progress Notes (Signed)
S: Knee better but still there O:BP 102/57  Pulse 82  Temp(Src) 97.5 F (36.4 C) (Oral)  Resp 16  Ht 5\' 2"  (1.575 m)  Wt 79.017 kg (174 lb 3.2 oz)  BMI 31.85 kg/m2  SpO2 94%  Intake/Output Summary (Last 24 hours) at 02/12/13 0707 Last data filed at 02/11/13 1600  Gross per 24 hour  Intake    360 ml  Output   3000 ml  Net  -2640 ml   Weight change: -2 kg (-4 lb 6.6 oz) ZOX:WRUEA and alert CVS:RRR Resp:clear Abd:+BS NTND VWU:JWJXB edema.  L AVF + bruit  Large Rt olecranon bursa.  Mild pain with flex/ext lt knee.  Lt knee with small effusion NEURO:CNI Ox3 no asterixis Chest:  Bandage over sternal wound with small amt of dried blood   . allopurinol  100 mg Oral BID  . amiodarone  200 mg Oral Daily  . aspirin EC  325 mg Oral Daily  . bisacodyl  10 mg Oral Daily   Or  . bisacodyl  10 mg Rectal Daily  . chlorpheniramine-HYDROcodone  5 mL Oral Q12H  . colchicine  0.6 mg Oral BID  . darbepoetin (ARANESP) injection - DIALYSIS  100 mcg Intravenous Q Fri-HD  . docusate sodium  200 mg Oral Daily  . enoxaparin (LOVENOX) injection  30 mg Subcutaneous QHS  . ferumoxytol  1,020 mg Intravenous Once  . gabapentin  600 mg Oral BID  . indomethacin  25 mg Oral TID WC  . insulin aspart  0-24 Units Subcutaneous Once  . insulin aspart  0-9 Units Subcutaneous TID WC  . insulin aspart  3 Units Subcutaneous TID WC  . insulin detemir  20 Units Subcutaneous Q1200  . metoprolol tartrate  12.5 mg Oral BID  . multivitamin  1 tablet Oral Daily  . pantoprazole  40 mg Oral Daily  . sevelamer carbonate  800 mg Oral TID WC  . sodium chloride  3 mL Intravenous Q12H  . tamsulosin  0.4 mg Oral QHS   No results found. BMET    Component Value Date/Time   NA 136 02/11/2013 0820   K 4.6 02/11/2013 0820   CL 98 02/11/2013 0820   CO2 28 02/11/2013 0820   GLUCOSE 115* 02/11/2013 0820   BUN 18 02/11/2013 0820   CREATININE 2.49* 02/11/2013 0820   CALCIUM 9.0 02/11/2013 0820   GFRNONAA 25* 02/11/2013 0820   GFRAA 29* 02/11/2013 0820   CBC    Component Value Date/Time   WBC 14.6* 02/11/2013 0820   RBC 2.77* 02/11/2013 0820   HGB 8.3* 02/11/2013 0820   HCT 26.9* 02/11/2013 0820   PLT 372 02/11/2013 0820   MCV 97.1 02/11/2013 0820   MCH 30.0 02/11/2013 0820   MCHC 30.9 02/11/2013 0820   RDW 16.9* 02/11/2013 0820   LYMPHSABS 1.5 01/06/2013 0753   MONOABS 0.8 01/06/2013 0753   EOSABS 0.5 01/06/2013 0753   BASOSABS 0.1 01/06/2013 0753     Assessment:  1.  SP CABG and R CAE 2. Hypotension, improved 3. Anemia on aranesp, iron levels low, given 1 unit blood yest 4. 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 DM 6. ESRD  Plan: 1.IV iron, does not appear it was given? 2.  Plan HD Monday if still here     Nicholas [name] T

## 2013-02-12 NOTE — Progress Notes (Addendum)
       [address]       [place] [address]             [phone]          9 Days Post-Op Procedure(s) (LRB): CORONARY ARTERY BYPASS GRAFTING (CABG) (N/A) ENDARTERECTOMY CAROTID (N/A)  Subjective: Feeing much better today.  Some improvement in gout. Walked better with cardiac rehab.   Objective: Vital signs in last 24 hours: Patient Vitals for the past 24 hrs:  BP Temp Temp src Pulse Resp SpO2 Weight  02/12/13 0655 102/57 mmHg 97.5 F (36.4 C) Oral 82 16 94 % 174 lb 3.2 oz (79.017 kg)  02/12/13 0025 94/55 mmHg - - 87 18 - -  02/11/13 1958 103/64 mmHg 98.7 F (37.1 C) Oral 87 20 93 % -  02/11/13 1751 93/59 mmHg - - 84 - - -  02/11/13 1511 80/50 mmHg - - 87 - - -  02/11/13 1459 109/93 mmHg 98.2 F (36.8 C) Oral 82 18 92 % -  02/11/13 1105 135/82 mmHg - - 106 16 100 % -  02/11/13 1041 133/77 mmHg 98.1 F (36.7 C) Oral 101 14 97 % 176 lb 12.9 oz (80.2 kg)  02/11/13 1033 105/67 mmHg - - 101 16 - -  02/11/13 1001 114/67 mmHg - - 100 22 - -  02/11/13 0950 98/68 mmHg 98.2 F (36.8 C) Oral 101 23 - -  02/11/13 0930 108/68 mmHg 98.1 F (36.7 C) Oral 102 18 - -  02/11/13 0918 90/68 mmHg - - 104 20 - -  02/11/13 0900 112/67 mmHg - - 105 20 - -   Current Weight  02/12/13 174 lb 3.2 oz (79.017 kg)     Intake/Output from previous day: 06/27 0701 - 06/28 0700 In: 360 [P.O.:360] Out: 3000     PHYSICAL EXAM:  Heart: RRR Lungs: Slightly decreased BS in bases Wound: Sternal wound without erythema or drainage, other wounds stable Extremities: Decreased LE edema    Lab Results: CBC: Recent Labs  02/09/13 1319 02/11/13 0820  WBC 9.5 14.6*  HGB 7.3* 8.3*  HCT 23.3* 26.9*  PLT 269 372   BMET:  Recent Labs  02/09/13 1320 02/11/13 0820  NA 135 136  K 3.7 4.6  CL 92* 98  CO2 28 28  GLUCOSE 138* 115*  BUN 32* 18  CREATININE 3.92* 2.49*  CALCIUM 8.7 9.0    PT/INR: No results found for this basename: LABPROT, INR,  in the last 72  hours    Assessment/Plan: S/P Procedure(s) (LRB): CORONARY ARTERY BYPASS GRAFTING (CABG) (N/A) ENDARTERECTOMY CAROTID (N/A) CV- BPs stable on current meds. Gout- pain improved with colchicine, indocin. Anemia- stable, to receive IV Fe today per renal. ESRD- per renal. Deconditioning- CRPI, PT. Disp- to SNF soon.    LOS: 9 days    [name],Nicholas H 02/12/2013  Walked more this am, gout better I have seen and examined Nicholas [name] and agree with the above assessment  and plan.  Nicholas Ovens MD [facility] [phone] Office [phone] 02/12/2013 10:28 AM

## 2013-02-12 NOTE — Progress Notes (Signed)
Pharmacy called per MD request concerning Feraheme. Pharmacy states Duncan Dull was not administered when ordered on 6/26. New orders for Feraheme put in to be administer today per MD order.

## 2013-02-12 NOTE — Progress Notes (Signed)
Pt did not wish to walk again tonight because of his gout pain.

## 2013-02-12 NOTE — Progress Notes (Signed)
CARDIAC REHAB PHASE I   PRE:  Rate/Rhythm: 81 SR    BP: sitting 90/53    SaO2: 91, 88 RA  MODE:  Ambulation: 150 ft   POST:  Rate/Rhythm: 92 SR    BP: sitting 104/58     SaO2: 90-91 2L  Feeling better. Able to walk around circle with rollator, 2L O2, assist x2. Several standing rest stops due to fatigue and SOB. VSS after walk although O2 lower than normal. To recliner, will f/u Mon. Encouraged to walk with RN. [phone]   Nicholas [name] CES, ACSM 02/12/2013 8:21 AM

## 2013-02-13 LAB — GLUCOSE, CAPILLARY
Glucose-Capillary: 94 mg/dL (ref 70–99)
Glucose-Capillary: 61 mg/dL — ABNORMAL LOW (ref 70–99)
Glucose-Capillary: 76 mg/dL (ref 70–99)
Glucose-Capillary: 69 mg/dL — ABNORMAL LOW (ref 70–99)
Glucose-Capillary: 71 mg/dL (ref 70–99)

## 2013-02-13 MED ORDER — GLUCOSE 40 % PO GEL
ORAL | Status: AC
  Administered 2013-02-14: 1
  Filled 2013-02-13: qty 1

## 2013-02-13 NOTE — Progress Notes (Signed)
Pt refused to walk at this time. Will continue to monitor.  

## 2013-02-13 NOTE — Progress Notes (Signed)
S: Was up walking yest O:BP 106/63  Pulse 84  Temp(Src) 98.2 F (36.8 C) (Oral)  Resp 18  Ht 5\' 2"  (1.575 m)  Wt 83 kg (182 lb 15.7 oz)  BMI 33.46 kg/m2  SpO2 90%  Intake/Output Summary (Last 24 hours) at 02/13/13 0707 Last data filed at 02/13/13 0500  Gross per 24 hour  Intake   1150 ml  Output      0 ml  Net   1150 ml   Weight change: 2.8 kg (6 lb 2.8 oz) ZOX:WRUEA and alert CVS:RRR Resp:clear Abd:+BS NTND VWU:JWJXB edema.  L AVF + bruit  Large Rt olecranon bursa. .  Lt knee with small effusion NEURO:CNI Ox3 no asterixis Chest:  Bandage over sternal wound with small amt of dried blood   . allopurinol  100 mg Oral BID  . amiodarone  200 mg Oral Daily  . aspirin EC  325 mg Oral Daily  . bisacodyl  10 mg Oral Daily   Or  . bisacodyl  10 mg Rectal Daily  . chlorpheniramine-HYDROcodone  5 mL Oral Q12H  . colchicine  0.6 mg Oral BID  . darbepoetin (ARANESP) injection - DIALYSIS  100 mcg Intravenous Q Fri-HD  . docusate sodium  200 mg Oral Daily  . enoxaparin (LOVENOX) injection  30 mg Subcutaneous QHS  . ferumoxytol  1,020 mg Intravenous Once  . gabapentin  600 mg Oral BID  . indomethacin  25 mg Oral TID WC  . insulin aspart  0-24 Units Subcutaneous Once  . insulin aspart  0-9 Units Subcutaneous TID WC  . insulin aspart  3 Units Subcutaneous TID WC  . insulin detemir  20 Units Subcutaneous Q1200  . metoprolol tartrate  12.5 mg Oral BID  . multivitamin  1 tablet Oral Daily  . pantoprazole  40 mg Oral Daily  . sevelamer carbonate  800 mg Oral TID WC  . sodium chloride  3 mL Intravenous Q12H  . tamsulosin  0.4 mg Oral QHS   No results found. BMET    Component Value Date/Time   NA 136 02/11/2013 0820   K 4.6 02/11/2013 0820   CL 98 02/11/2013 0820   CO2 28 02/11/2013 0820   GLUCOSE 115* 02/11/2013 0820   BUN 18 02/11/2013 0820   CREATININE 2.49* 02/11/2013 0820   CALCIUM 9.0 02/11/2013 0820   GFRNONAA 25* 02/11/2013 0820   GFRAA 29* 02/11/2013 0820   CBC     Component Value Date/Time   WBC 14.6* 02/11/2013 0820   RBC 2.77* 02/11/2013 0820   HGB 8.3* 02/11/2013 0820   HCT 26.9* 02/11/2013 0820   PLT 372 02/11/2013 0820   MCV 97.1 02/11/2013 0820   MCH 30.0 02/11/2013 0820   MCHC 30.9 02/11/2013 0820   RDW 16.9* 02/11/2013 0820   LYMPHSABS 1.5 01/06/2013 0753   MONOABS 0.8 01/06/2013 0753   EOSABS 0.5 01/06/2013 0753   BASOSABS 0.1 01/06/2013 0753     Assessment:  1.  SP CABG and R CAE 2. Hypotension, improved 3. Anemia on aranesp,  given 1 unit blood fri and received IV iron 4. 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 DM 6. ESRD  Plan: 1HD in AM and will check labs at that time.  Will do first shift anticipating DC tomorrow.     Shivansh Hardaway T

## 2013-02-13 NOTE — Progress Notes (Addendum)
       [address]       [place] [address]             [phone]          10 Days Post-Op Procedure(s) (LRB): CORONARY ARTERY BYPASS GRAFTING (CABG) (N/A) ENDARTERECTOMY CAROTID (N/A)  Subjective: Multiple complaints this am.  Increased cough, nonproductive.  Nauseated, knee hurting worse after walking twice yesterday.   Objective: Vital signs in last 24 hours: Patient Vitals for the past 24 hrs:  BP Temp Temp src Pulse Resp SpO2 Weight  02/13/13 0809 110/73 mmHg - - 88 18 - -  02/13/13 0506 106/63 mmHg 98.2 F (36.8 C) Oral 84 18 90 % 182 lb 15.7 oz (83 kg)  02/12/13 2134 99/60 mmHg 98.1 F (36.7 C) Oral 84 18 99 % -  02/12/13 1855 136/75 mmHg - - 85 - 96 % -  02/12/13 1631 102/75 mmHg - - 77 - - -  02/12/13 1449 99/61 mmHg - - 74 - 94 % -  02/12/13 1416 89/49 mmHg 98 F (36.7 C) - 90 - 91 % -  02/12/13 1312 86/58 mmHg - - 76 - - -  02/12/13 1308 82/48 mmHg - - 77 - 96 % -  02/12/13 1155 90/58 mmHg - - 76 - - -  02/12/13 1145 96/52 mmHg - - 75 - - -  02/12/13 1142 82/54 mmHg - - 76 - 95 % -  02/12/13 1000 96/52 mmHg - - 74 - - -   Current Weight  02/13/13 182 lb 15.7 oz (83 kg)     Intake/Output from previous day: 06/28 0701 - 06/29 0700 In: 1150 [P.O.:900; IV Piggyback:250] Out: -     PHYSICAL EXAM:  Heart: RRR Lungs: Few basilar crackles, poor inspiratory effort  Wound: Clean and dry, no sternal drainage Extremities: Mild LE edema, L knee wrapped in ACE wrap    Lab Results: CBC: Recent Labs  02/11/13 0820  WBC 14.6*  HGB 8.3*  HCT 26.9*  PLT 372   BMET:  Recent Labs  02/11/13 0820  NA 136  K 4.6  CL 98  CO2 28  GLUCOSE 115*  BUN 18  CREATININE 2.49*  CALCIUM 9.0    PT/INR: No results found for this basename: LABPROT, INR,  in the last 72 hours    Assessment/Plan: S/P Procedure(s) (LRB): CORONARY ARTERY BYPASS GRAFTING (CABG) (N/A) ENDARTERECTOMY CAROTID (N/A) CV- BPs low normal.  Meds held per  nephrology.  Will watch. ESRD- for HD in am. Gout- Continue colchicine, indocin. Pulm- increase pulm toilet, continue prn Tussionex, Tessalon perles. Disp- to SNF when medically stable.   LOS: 10 days    Moch,GINA H 02/13/2013  To snf when bed ready I have seen and examined Luiz Iron and agree with the above assessment  and plan.  Delight Ovens MD [facility] [phone] Office [phone] 02/13/2013 10:12 AM

## 2013-02-13 NOTE — Progress Notes (Signed)
Pt ambulated in hallway 150 ft with wheelchair, 2 liters of oxygen and 2 assist. Oxygen sats were 89-90 % on 2 liters while ambulating. Pt took multiple standing breaks. Pt assisted back to chair in room with call bell within reach. Pt oxygen sats while resting on 2 liters were 93 %. Will continue to monitor.

## 2013-02-13 NOTE — Progress Notes (Signed)
Patient refused to ambulate stated he did not feel good.

## 2013-02-14 ENCOUNTER — Inpatient Hospital Stay (HOSPITAL_COMMUNITY): Payer: Medicare Other

## 2013-02-14 LAB — RENAL FUNCTION PANEL
Albumin: 2.7 g/dL — ABNORMAL LOW (ref 3.5–5.2)
BUN: 35 mg/dL — ABNORMAL HIGH (ref 6–23)
Calcium: 8.6 mg/dL (ref 8.4–10.5)
Creatinine, Ser: 4.06 mg/dL — ABNORMAL HIGH (ref 0.50–1.35)
Phosphorus: 3.3 mg/dL (ref 2.3–4.6)

## 2013-02-14 LAB — CBC
MCH: 30.6 pg (ref 26.0–34.0)
MCV: 95.7 fL (ref 78.0–100.0)
Platelets: 319 10*3/uL (ref 150–400)
RDW: 15.9 % — ABNORMAL HIGH (ref 11.5–15.5)

## 2013-02-14 LAB — GLUCOSE, CAPILLARY
Glucose-Capillary: 131 mg/dL — ABNORMAL HIGH (ref 70–99)
Glucose-Capillary: 181 mg/dL — ABNORMAL HIGH (ref 70–99)
Glucose-Capillary: 83 mg/dL (ref 70–99)

## 2013-02-14 MED ORDER — GLUCOSE 40 % PO GEL: 1.0000 | ORAL | Status: DC | PRN

## 2013-02-14 MED ORDER — ACETAMINOPHEN 500 MG PO TABS
500.0000 mg | ORAL_TABLET | ORAL | Status: DC | PRN
  Administered 2013-02-14 – 2013-02-15 (×2): 500 mg via ORAL
  Filled 2013-02-14 (×2): qty 1

## 2013-02-14 MED ORDER — GABAPENTIN 300 MG PO CAPS
300.0000 mg | ORAL_CAPSULE | Freq: Two times a day (BID) | ORAL | Status: DC
  Administered 2013-02-14 – 2013-02-15 (×2): 300 mg via ORAL
  Filled 2013-02-14 (×3): qty 1

## 2013-02-14 MED ORDER — DEXTROSE 50 % IV SOLN: 25.0000 mL | Freq: Once | INTRAVENOUS | Status: AC | PRN

## 2013-02-14 MED ORDER — LACTULOSE 10 GM/15ML PO SOLN
20.0000 g | Freq: Once | ORAL | Status: DC
  Filled 2013-02-14: qty 30

## 2013-02-14 MED ORDER — DEXTROSE 50 % IV SOLN
INTRAVENOUS | Status: AC
  Administered 2013-02-14: 1 via INTRAVENOUS
  Filled 2013-02-14: qty 50

## 2013-02-14 MED ORDER — GLUCOSE 40 % PO GEL
ORAL | Status: AC
  Administered 2013-02-13: 1 via ORAL
  Filled 2013-02-14: qty 1

## 2013-02-14 NOTE — Progress Notes (Signed)
Pt CBG still not coming up past 71. On recheck droped to 70.  Tried second glucose gel. Pt vomited. Unable to give zofran again until 0130. Pt given 1 amp D50 IV . CBG now 181. Will continue to monitor.

## 2013-02-14 NOTE — Progress Notes (Signed)
Pt unable to walk this morning due to nausea/vomiting. Pt is now in dialysis. Will attempt walk this afternoon.

## 2013-02-14 NOTE — Progress Notes (Signed)
Pt had two more loose stools. Pt states he is too tired from dialysis and getting up to go to the bathroom today. He is unable to take a walk this afternoon. Explained the importance of walking, pt still refused walk. Will encourage one later today if he can tolerate it.

## 2013-02-14 NOTE — Progress Notes (Signed)
Inpatient Diabetes Program Recommendations  AACE/ADA: New Consensus Statement on Inpatient Glycemic Control (2013)  Target Ranges:  Prepandial:   less than 140 mg/dL      Peak postprandial:   less than 180 mg/dL (1-2 hours)      Critically ill patients:  140 - 180 mg/dL  Results for JAKAREE, PICKARD (MRN [id number]) as of 02/14/2013 15:46  Ref. Range 02/13/2013 16:06 02/13/2013 16:36 02/13/2013 17:20 02/13/2013 21:15 02/13/2013 22:20 02/13/2013 22:53 02/13/2013 23:19 02/13/2013 23:55  Glucose-Capillary Latest Range: 70-99 mg/dL 61 (L) 63 (L) 87 76 69 (L) 69 (L) 71 70   Inpatient Diabetes Program Recommendations Insulin - Basal: decrease Levemir to 12 units  Thank you  Piedad Climes BSN, RN,CDE Inpatient Diabetes Coordinator [phone] (team pager)

## 2013-02-14 NOTE — Progress Notes (Signed)
7:59am  Came to walk with patient but has just thrown up.  Will f/u later.

## 2013-02-14 NOTE — Procedures (Signed)
I was present at this dialysis session. I have reviewed the session itself and made appropriate changes.   Vinson Moselle, MD [employer] 02/14/2013, 12:54 PM

## 2013-02-14 NOTE — Progress Notes (Signed)
Read note and agree with change in d/c recs. Rollene Rotunda Hadlee Burback, PT, DPT [phone]

## 2013-02-14 NOTE — Progress Notes (Addendum)
S: Lots of nausea and vomiting last night and this am. Per RN last BM was on Wed 5d ago. No abd pain or fever  O:BP 100/58  Pulse 65  Temp(Src) 98.3 F (36.8 C) (Oral)  Resp 16  Ht 5\' 2"  (1.575 m)  Wt 82.7 kg (182 lb 5.1 oz)  BMI 33.34 kg/m2  SpO2 92%  Intake/Output Summary (Last 24 hours) at 02/14/13 1027 Last data filed at 02/14/13 0700  Gross per 24 hour  Intake    960 ml  Output      0 ml  Net    960 ml   Weight change: -0.3 kg (-10.6 oz) NWG:NFAOZ and alert CVS:RRR Resp:clear Abd: mildly distended, +BS heard, nontender HYQ:MVHQI edema.  L AVF + bruit  Large Rt olecranon bursa. .  Lt knee with small effusion NEURO:CNI Ox3 no asterixis Chest:  Bandage over sternal wound with small amt of dried blood   . allopurinol  100 mg Oral BID  . amiodarone  200 mg Oral Daily  . aspirin EC  325 mg Oral Daily  . bisacodyl  10 mg Oral Daily   Or  . bisacodyl  10 mg Rectal Daily  . chlorpheniramine-HYDROcodone  5 mL Oral Q12H  . colchicine  0.6 mg Oral BID  . darbepoetin (ARANESP) injection - DIALYSIS  100 mcg Intravenous Q Fri-HD  . docusate sodium  200 mg Oral Daily  . enoxaparin (LOVENOX) injection  30 mg Subcutaneous QHS  . ferumoxytol  1,020 mg Intravenous Once  . gabapentin  600 mg Oral BID  . insulin aspart  0-24 Units Subcutaneous Once  . insulin aspart  0-9 Units Subcutaneous TID WC  . insulin aspart  3 Units Subcutaneous TID WC  . insulin detemir  20 Units Subcutaneous Q1200  . lactulose  20 g Oral Once  . metoprolol tartrate  12.5 mg Oral BID  . multivitamin  1 tablet Oral Daily  . pantoprazole  40 mg Oral Daily  . sevelamer carbonate  800 mg Oral TID WC  . sodium chloride  3 mL Intravenous Q12H  . tamsulosin  0.4 mg Oral QHS   No results found. BMET    Component Value Date/Time   NA 136 02/11/2013 0820   K 4.6 02/11/2013 0820   CL 98 02/11/2013 0820   CO2 28 02/11/2013 0820   GLUCOSE 115* 02/11/2013 0820   BUN 18 02/11/2013 0820   CREATININE 2.49* 02/11/2013  0820   CALCIUM 9.0 02/11/2013 0820   GFRNONAA 25* 02/11/2013 0820   GFRAA 29* 02/11/2013 0820   CBC    Component Value Date/Time   WBC 14.6* 02/11/2013 0820   RBC 2.77* 02/11/2013 0820   HGB 8.3* 02/11/2013 0820   HCT 26.9* 02/11/2013 0820   PLT 372 02/11/2013 0820   MCV 97.1 02/11/2013 0820   MCH 30.0 02/11/2013 0820   MCHC 30.9 02/11/2013 0820   RDW 16.9* 02/11/2013 0820   LYMPHSABS 1.5 01/06/2013 0753   MONOABS 0.8 01/06/2013 0753   EOSABS 0.5 01/06/2013 0753   BASOSABS 0.1 01/06/2013 0753   Outpatient HD: MWF [facility] 4hrs F180 400/A1.5 EDW 80.5kg Bath 2K/2.25Ca LFA AVF Hect 2ug EPO 6800 Venofer 50mg /wk    Assessment:  1. SP CABG and R CAE 2. Nausea/vomiting- d/c colchicine for now, stopped all narcotics, check KUB, lax/enema if +stool on KUB 3. Hypotension- persists, on low dose metoprolol, may need to hold 4. Anemia on aranesp,  given 1 unit blood fri and received IV iron  5. Sec HPTH on hectoral 6. DM 7. ESRD 8. Pharm- on neurontin unsure what for, decrease dose to renal max 300 bid   Vinson Moselle  MD Pager [phone]    Cell  [phone] 02/14/2013, 10:34 AM

## 2013-02-14 NOTE — Progress Notes (Signed)
Per report, pt had sizeable BM in dialysis today.

## 2013-02-14 NOTE — Progress Notes (Signed)
Patient stated that he was to tiered to ambulate this evening after the day he has had.

## 2013-02-14 NOTE — Progress Notes (Addendum)
      [address]       [place] [address]             [phone]        11 Days Post-Op Procedure(s) (LRB): CORONARY ARTERY BYPASS GRAFTING (CABG) (N/A) ENDARTERECTOMY CAROTID (N/A)  Subjective: Patient had nausea and emesis this am. Denies abdominal pain. Has not had a bowel movement in a few days.  Objective: Vital signs in last 24 hours: Temp:  [97.8 F (36.6 C)-98.3 F (36.8 C)] 98.3 F (36.8 C) (06/30 0404) Pulse Rate:  [65-88] 65 (06/30 0404) Cardiac Rhythm:  [-] Normal sinus rhythm (06/29 1935) Resp:  [16-18] 16 (06/30 0404) BP: (87-117)/(50-73) 100/58 mmHg (06/30 0514) SpO2:  [90 %-93 %] 92 % (06/30 0404) Weight:  [82.7 kg (182 lb 5.1 oz)] 82.7 kg (182 lb 5.1 oz) (06/30 0404)  Pre op weight  81 kg Current Weight  02/14/13 82.7 kg (182 lb 5.1 oz)      Intake/Output from previous day: 06/29 0701 - 06/30 0700 In: 1200 [P.O.:1200] Out: -    Physical Exam:  Cardiovascular: RRR Pulmonary: Diminished at left base; no rales, wheezes, or rhonchi. Abdomen: Soft, non tender, distended, sporadic bowel sounds present. Extremities: Trace bilateral lower extremity edema. L knee is swollen, warm, and tender Wounds: Sternal wound appears to have skin edge necrosis  Lab Results: CBC:  Recent Labs  02/11/13 0820  WBC 14.6*  HGB 8.3*  HCT 26.9*  PLT 372   BMET:   Recent Labs  02/11/13 0820  NA 136  K 4.6  CL 98  CO2 28  GLUCOSE 115*  BUN 18  CREATININE 2.49*  CALCIUM 9.0    PT/INR:  Lab Results  Component Value Date   INR 1.48 02/03/2013   INR 1.04 02/01/2013   INR 1.1* 01/04/2013   ABG:  INR: Will add last result for INR, ABG once components are confirmed Will add last 4 CBG results once components are confirmed  Assessment/Plan:  1. CV - SR/ST. On Amiodarone 200 daily. Lopressor 12.5 bid 2.  Pulmonary - On 2 liters of oxygen via [place]. Will likely need O2 at discharge.Encourage incentive spirometer and flutter valve 3.   Acute blood loss anemia - Last H and H 7.3 and 23.3 Likely related to ESRD and ABL anemia. On Aranesp. 4.DM- CBGs 70/181/83.Pre op HGA1C 5.6.  On Levemir 20 units. Will hold today 5.ESRD- HD per nephrology 6.Gout-on Colchicine bid.  7.GI-LOC constipation. Monitor 8.Will need SNF when ready for discharge-possibly am  ZIMMERMAN,DONIELLE MPA-C 02/14/2013,7:53 AM  I have seen and examined the patient and agree with the assessment and plan as outlined.  OWEN,CLARENCE H 02/14/2013 8:58 AM

## 2013-02-14 NOTE — Progress Notes (Signed)
Pt CBG 76. Patient given snack. Rechecked CBG at 2220 CBG 69.

## 2013-02-14 NOTE — Progress Notes (Signed)
Hypoglycemic Event  CBG: 69  Treatment: 15 GM carbohydrate snack  Symptoms: None  Follow-up CBG: Time:2253 CBG Result:69  Possible Reasons for Event: Unknown  Comments/MD notified:    Mercy Moore  Remember to initiate Hypoglycemia Order Set & complete

## 2013-02-14 NOTE — Progress Notes (Signed)
CSW continuing to follow for disposition planning.   Pt plans are for [facility] at discharge.  Per progression meeting, pt not yet medically ready for discharge.  CSW left voice message for [facility] admission coordinator.  CSW to continue to follow and facilitate pt discharge needs when pt medically stable for discharge.  Jacklynn Lewis, MSW, LCSWA (coverage [phone]) Clinical Social Work

## 2013-02-14 NOTE — Progress Notes (Signed)
PT Cancellation Note  Patient Details Name: Nicholas [name] MRN: [id number] DOB: 15-Jan-1941   Cancelled Treatment:    Reason Eval/Treat Not Completed: Medical issues which prohibited therapy. Patient nauseated and vomiting this AM. Will reattempt later as time allows.     Fredrich Birks 02/14/2013, 11:46 AM

## 2013-02-15 LAB — TYPE AND SCREEN
ABO/RH(D): A POS
Antibody Screen: NEGATIVE
Unit division: 0
Unit division: 0

## 2013-02-15 LAB — BASIC METABOLIC PANEL
Calcium: 8.5 mg/dL (ref 8.4–10.5)
GFR calc non Af Amer: 16 mL/min — ABNORMAL LOW (ref >90)
Glucose, Bld: 111 mg/dL — ABNORMAL HIGH (ref 70–99)
Sodium: 134 mEq/L — ABNORMAL LOW (ref 135–145)

## 2013-02-15 LAB — CBC
Hemoglobin: 7.8 g/dL — ABNORMAL LOW (ref 13.0–17.0)
MCH: 30.6 pg (ref 26.0–34.0)
MCHC: 31 g/dL (ref 30.0–36.0)
Platelets: 343 10*3/uL (ref 150–400)

## 2013-02-15 LAB — GLUCOSE, CAPILLARY: Glucose-Capillary: 145 mg/dL — ABNORMAL HIGH (ref 70–99)

## 2013-02-15 MED ORDER — GABAPENTIN 300 MG PO CAPS: 300.0000 mg | ORAL_CAPSULE | Freq: Two times a day (BID) | ORAL | Status: AC

## 2013-02-15 MED ORDER — METOPROLOL TARTRATE 12.5 MG HALF TABLET: 12.5000 mg | ORAL_TABLET | Freq: Two times a day (BID) | ORAL | Status: DC

## 2013-02-15 MED ORDER — ASPIRIN 325 MG PO TBEC: 325.0000 mg | DELAYED_RELEASE_TABLET | Freq: Every day | ORAL | Status: DC

## 2013-02-15 MED ORDER — TRAMADOL HCL 50 MG PO TABS: 50.0000 mg | ORAL_TABLET | Freq: Two times a day (BID) | ORAL | Status: DC | PRN

## 2013-02-15 MED ORDER — DARBEPOETIN ALFA-POLYSORBATE 100 MCG/0.5ML IJ SOLN: 100.0000 ug | INTRAMUSCULAR | Status: DC

## 2013-02-15 MED ORDER — ACETAMINOPHEN 500 MG PO TABS: 500.0000 mg | ORAL_TABLET | ORAL | Status: DC | PRN

## 2013-02-15 NOTE — Progress Notes (Signed)
Patient for d/c today to SNF bed at  [facility]. Patient agreeable to this plan- plan transfer via EMS. Reece Levy, MSW [phone]

## 2013-02-15 NOTE — Progress Notes (Signed)
Clinical Social Work Department CLINICAL SOCIAL WORK PLACEMENT NOTE 02/15/2013  Patient:  Nicholas [name], Nicholas [name]  Account Number:  [id number] Admit date:  02/03/2013  Clinical Social Worker:  Robin Searing  Date/time:  02/10/2013 04:13 PM  Clinical Social Work is seeking post-discharge placement for this patient at the following level of care:   SKILLED NURSING   (*CSW will update this form in Epic as items are completed)   02/09/2013  Patient/family provided with [facility] Department of Clinical Social Work's list of facilities offering this level of care within the geographic area requested by the patient (or if unable, by the patient's family).  02/09/2013  Patient/family informed of their freedom to choose among providers that offer the needed level of care, that participate in Medicare, Medicaid or managed care program needed by the patient, have an available bed and are willing to accept the patient.  02/09/2013  Patient/family informed of [facility]' ownership interest in [facility], as well as of the fact that they are under no obligation to receive care at this facility.  PASARR submitted to EDS on 02/09/2013 PASARR number received from EDS on 02/09/2013  FL2 transmitted to all facilities in geographic area requested by pt/family on  02/10/2013 FL2 transmitted to all facilities within larger geographic area on   Patient informed that his/her managed care company has contracts with or will negotiate with  certain facilities, including the following:     Patient/family informed of bed offers received:  02/09/2013 Patient chooses bed at [facility], [place] Physician recommends and patient chooses bed at    [facility] [place] on  02/15/2013 Patient to be transferred to facility by EMS  The following physician request were entered in Epic:   Additional Comments: Reece Levy,  MSW [phone]

## 2013-02-15 NOTE — Progress Notes (Signed)
Report called to [facility] facility. Pt is stable and ready for discharge via EMS.

## 2013-02-15 NOTE — Progress Notes (Addendum)
      [address]       [place] [address]             [phone]        12 Days Post-Op Procedure(s) (LRB): CORONARY ARTERY BYPASS GRAFTING (CABG) (N/A) ENDARTERECTOMY CAROTID (N/A)  Subjective: Patient about to eat breakfast. Feels better today than yesterday.  Objective: Vital signs in last 24 hours: Temp:  [98.2 F (36.8 C)-98.8 F (37.1 C)] 98.8 F (37.1 C) (07/01 0407) Pulse Rate:  [73-95] 78 (07/01 0407) Cardiac Rhythm:  [-] Normal sinus rhythm (06/30 1930) Resp:  [10-18] 12 (07/01 0407) BP: (79-124)/(41-72) 95/53 mmHg (07/01 0407) SpO2:  [92 %-97 %] 92 % (07/01 0407) Weight:  [79.9 kg (176 lb 2.4 oz)-84 kg (185 lb 3 oz)] 79.9 kg (176 lb 2.4 oz) (07/01 0407)  Pre op weight  81 kg Current Weight  02/15/13 79.9 kg (176 lb 2.4 oz)      Intake/Output from previous day: 06/30 0701 - 07/01 0700 In: -  Out: 1551 [Stool:1]   Physical Exam:  Cardiovascular: RRR Pulmonary: Diminished at left base; no rales, wheezes, or rhonchi. Abdomen: Soft, non tender, distended, sporadic bowel sounds present. Extremities: Trace bilateral lower extremity edema. L knee is swollen, warm, and tender Wounds: Sternal wound appears to have skin edge necrosis. RLE hematoma  Lab Results: CBC:  Recent Labs  02/14/13 1205 02/15/13 0445  WBC 11.5* 11.5*  HGB 8.6* 7.8*  HCT 26.9* 25.2*  PLT 319 343   BMET:   Recent Labs  02/14/13 1205 02/15/13 0445  NA 133* 134*  K 4.4 4.3  CL 93* 96  CO2 26 30  GLUCOSE 112* 111*  BUN 35* 24*  CREATININE 4.06* 3.65*  CALCIUM 8.6 8.5    PT/INR:  Lab Results  Component Value Date   INR 1.48 02/03/2013   INR 1.04 02/01/2013   INR 1.1* 01/04/2013   ABG:  INR: Will add last result for INR, ABG once components are confirmed Will add last 4 CBG results once components are confirmed  Assessment/Plan:  1. CV - SR/ST. On Amiodarone 200 daily. Lopressor 12.5 bid. Hold Lopressor this am if SBP less than or = to 100 2.   Pulmonary - On 2 liters of oxygen via [place]. Will likely need O2 at discharge.Encourage incentive spirometer and flutter valve 3.  Acute blood loss anemia - Last H and H 7.8and 25.2 Likely related to ESRD and ABL anemia. On Aranesp.  4.DM- CBGs 123/131/110 .Pre op HGA1C 5.6.  On Levemir 20 units. Will stop meal coverage as glucose has been low of late 5.ESRD- Had HD yesterday 6.Gout-on Colchicine bid.  7.Will need SNF when ready for discharge-possibly today  ZIMMERMAN,DONIELLE MPA-C 02/15/2013,7:35 AM  Feels better today Plan snf placement today I have seen and examined Nicholas [name] and agree with the above assessment  and plan.  Delight Ovens MD Beeper [phone] Office [phone] 02/15/2013 8:55 AM

## 2013-02-15 NOTE — Progress Notes (Signed)
CARDIAC REHAB PHASE I   PRE:  Rate/Rhythm: 79SR  BP:  Supine: 88/55, 90/53  Sitting:   Standing:    SaO2: 82%1L, 91%2L  MODE:  Ambulation: 150 ft   POST:  Rate/Rhythm: 92  BP:  Supine:   Sitting: 111/55  Standing:    SaO2: 85%2L, 92%3L   SATURATION QUALIFICATIONS: (This note is used to comply with regulatory documentation for home oxygen)  Patient Saturations on Room Air at Rest = 82%  Patient Saturations on Room Air while Ambulating = 82%  Patient Saturations on 2 Liters of oxygen while Ambulating = 85%%. Took 3L to keep 92%  Please briefly explain why patient needs home oxygen: desat on RA, took 3L to keep sats up  1035-1115 Pt walked 150 ft on 3L with gait belt, rollator and asst x 2. Started out on 2L  But took 3L to keep sats up. Pt stopped several times to rest. Encouraged pt to stand upright. Encouraged pt to walk as tolerated at rehab and home, increasing as he is able. Encouraged IS. Not interested in CRP 2. To bed after walk. Left on 2L for resting.    Luetta Nutting, RN BSN  02/15/2013 11:05 AM

## 2013-02-15 NOTE — Progress Notes (Signed)
S: Several BM's last night, nausea better. Taking tylenol for pain  O:BP 95/53  Pulse 78  Temp(Src) 98.8 F (37.1 C) (Oral)  Resp 12  Ht 5\' 2"  (1.575 m)  Wt 79.9 kg (176 lb 2.4 oz)  BMI 32.21 kg/m2  SpO2 92%  Intake/Output Summary (Last 24 hours) at 02/15/13 0930 Last data filed at 02/15/13 0830  Gross per 24 hour  Intake    220 ml  Output   1551 ml  Net  -1331 ml   Weight change: 1.3 kg (2 lb 13.9 oz)   . allopurinol  100 mg Oral BID  . amiodarone  200 mg Oral Daily  . aspirin EC  325 mg Oral Daily  . bisacodyl  10 mg Oral Daily   Or  . bisacodyl  10 mg Rectal Daily  . darbepoetin (ARANESP) injection - DIALYSIS  100 mcg Intravenous Q Fri-HD  . docusate sodium  200 mg Oral Daily  . enoxaparin (LOVENOX) injection  30 mg Subcutaneous QHS  . ferumoxytol  1,020 mg Intravenous Once  . gabapentin  300 mg Oral BID  . insulin aspart  0-24 Units Subcutaneous Once  . insulin aspart  0-9 Units Subcutaneous TID WC  . insulin detemir  20 Units Subcutaneous Q1200  . lactulose  20 g Oral Once  . metoprolol tartrate  12.5 mg Oral BID  . multivitamin  1 tablet Oral Daily  . pantoprazole  40 mg Oral Daily  . sevelamer carbonate  800 mg Oral TID WC  . sodium chloride  3 mL Intravenous Q12H  . tamsulosin  0.4 mg Oral QHS   Dg Abd 1 View  02/14/2013   *RADIOLOGY REPORT*  Clinical Data: Nausea and vomiting.  ABDOMEN - 1 VIEW  Comparison: Single view of the chest 02/08/2013.  Findings: The bowel gas pattern is unremarkable.  There appears be airspace opacity  and a left pleural effusion as seen on the prior plain films.  IMPRESSION:  1.  Benign appearing abdomen. 2.  Likely left pleural effusion and basilar airspace disease.   Original Report Authenticated By: Holley Dexter, M.D.   BMET    Component Value Date/Time   NA 134* 02/15/2013 0445   K 4.3 02/15/2013 0445   CL 96 02/15/2013 0445   CO2 30 02/15/2013 0445   GLUCOSE 111* 02/15/2013 0445   BUN 24* 02/15/2013 0445   CREATININE 3.65*  02/15/2013 0445   CALCIUM 8.5 02/15/2013 0445   GFRNONAA 16* 02/15/2013 0445   GFRAA 18* 02/15/2013 0445   CBC    Component Value Date/Time   WBC 11.5* 02/15/2013 0445   RBC 2.55* 02/15/2013 0445   HGB 7.8* 02/15/2013 0445   HCT 25.2* 02/15/2013 0445   PLT 343 02/15/2013 0445   MCV 98.8 02/15/2013 0445   MCH 30.6 02/15/2013 0445   MCHC 31.0 02/15/2013 0445   RDW 16.5* 02/15/2013 0445   LYMPHSABS 1.5 01/06/2013 0753   MONOABS 0.8 01/06/2013 0753   EOSABS 0.5 01/06/2013 0753   BASOSABS 0.1 01/06/2013 0753   Exam AVW:UJWJX and alert CVS:RRR Resp:clear Abd: mildly distended, +BS heard, nontender BJY:NWGNF edema.  L AVF + bruit  Large Rt olecranon bursa. .  Lt knee with small effusion NEURO:CNI Ox3 no asterixis Chest:  Bandage over sternal wound with small amt of dried blood  Outpatient HD: MWF [facility] 4hrs F180 400/A1.5 EDW 80.5kg Bath 2K/2.25Ca LFA AVF Hect 2ug EPO 6800 Venofer 50mg /wk    Assessment/Plan 1. SP CABG and R CAE 2.  Nausea/vomiting- much better after BM and off of narcotics and off colchicine. Continue to avoid narcotics/colchicine for now 3. Gout- on allopurinol. Could resume colchicine at 0.6 once per day max if needed (renal dosing) 4. Hypotension/volume- BP's remain low, will d/c metoprolol for now. Minimal UF w HD tomorrow. Just below EDW 5. Anemia- on aranesp,  given 1 unit blood fri and received IV iron 6. 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 DM- on insulin 8. ESRD, cont MWF HD 9. Pharm- on neurontin, unsure what for, decrease dose to renal max 300 bid   Vinson Moselle  MD Pager [phone]    Cell  [phone] 02/15/2013, 9:30 AM

## 2013-02-15 NOTE — Progress Notes (Signed)
Physical Therapy Treatment Patient Details Name: Nicholas [name] MRN: [id number] DOB: 24-Feb-1941 Today's Date: 02/15/2013 Time: 4098-1191 PT Time Calculation (min): 27 min  PT Assessment / Plan / Recommendation  PT Comments   Pt able to make progress and increase ambulation distance.  However continues to be limited by overall fatigue.  Pt needed increase assistance from sit > stand with lower surface requiring max (A).    Follow Up Recommendations  SNF     Equipment Recommendations  None recommended by PT    Frequency Min 3X/week   Progress towards PT Goals Progress towards PT goals: Progressing toward goals  Plan Current plan remains appropriate    Precautions / Restrictions Precautions Precautions: Sternal;Posterior Hip Precaution Comments: h/o bil hip replacements Restrictions Weight Bearing Restrictions: No   Pertinent Vitals/Pain C/o right flank pain after ambulation but does not rate.  Notified RN.    Mobility  Bed Mobility Bed Mobility: Sit to Supine Sit to Supine: 4: Min guard Details for Bed Mobility Assistance: Minguard for safety and cues for proper technique to return to supine from sit Transfers Transfers: Sit to Stand;Stand to Sit Sit to Stand: 2: Max assist;From chair/3-in-1 Stand to Sit: 4: Min assist;To bed Details for Transfer Assistance: Increase assistance needed from lower surface with max cues for technique and prevent pushing up with UE. Ambulation/Gait Ambulation/Gait Assistance: 4: Min assist Ambulation Distance (Feet): 40 Feet Assistive device: Rolling walker Ambulation/Gait Assistance Details: (A) to maintain balance with cues for upright posture  Gait Pattern: Step-through pattern;Trunk flexed Gait velocity: less than 1.8 ft/sec putting him at risk for recurrent falls    Exercises General Exercises - Lower Extremity Gluteal Sets: Strengthening;10 reps Hip ABduction/ADduction: Strengthening;Both;10 reps Straight Leg Raises:  Strengthening;Both;10 reps   PT Diagnosis:    PT Problem List:   PT Treatment Interventions:     PT Goals (current goals can now be found in the care plan section) Acute Rehab PT Goals Patient Stated Goal: to go home after some form of rehab  Visit Information  Last PT Received On: 02/15/13 Assistance Needed: +2 (sit <> stand) History of Present Illness: s/p CABG & carotid endarterectomy    Subjective Data  Subjective: My right side hurts after I walk so I'm not sure whats going on Patient Stated Goal: to go home after some form of rehab   Cognition  Cognition Arousal/Alertness: Awake/alert Behavior During Therapy: WFL for tasks assessed/performed Overall Cognitive Status: Within Functional Limits for tasks assessed    Balance     End of Session PT - End of Session Equipment Utilized During Treatment: Gait belt;Oxygen (2L) Activity Tolerance: Patient limited by fatigue Patient left: in bed;with call bell/phone within reach Nurse Communication: Mobility status   GP     Nicholas [name] 02/15/2013, 9:33 AM Nicholas [name], PT DPT [phone]

## 2013-02-16 ENCOUNTER — Inpatient Hospital Stay (HOSPITAL_COMMUNITY)
Admission: EM | Admit: 2013-02-16 | Discharge: 2013-02-22 | DRG: 640 | Disposition: A | Discharge status: Skilled Nursing Facility | Payer: Medicare Other | Attending: Internal Medicine | Admitting: Internal Medicine

## 2013-02-16 ENCOUNTER — Emergency Department (HOSPITAL_COMMUNITY): Payer: Medicare Other

## 2013-02-16 ENCOUNTER — Encounter (HOSPITAL_COMMUNITY): Payer: Self-pay | Admitting: Emergency Medicine

## 2013-02-16 DIAGNOSIS — J986 Disorders of diaphragm: Secondary | ICD-10-CM | POA: Diagnosis present

## 2013-02-16 DIAGNOSIS — I252 Old myocardial infarction: Secondary | ICD-10-CM

## 2013-02-16 DIAGNOSIS — Z951 Presence of aortocoronary bypass graft: Secondary | ICD-10-CM

## 2013-02-16 DIAGNOSIS — J9601 Acute respiratory failure with hypoxia: Secondary | ICD-10-CM | POA: Diagnosis present

## 2013-02-16 DIAGNOSIS — M810 Age-related osteoporosis without current pathological fracture: Secondary | ICD-10-CM | POA: Diagnosis present

## 2013-02-16 DIAGNOSIS — Z794 Long term (current) use of insulin: Secondary | ICD-10-CM

## 2013-02-16 DIAGNOSIS — Z79899 Other long term (current) drug therapy: Secondary | ICD-10-CM

## 2013-02-16 DIAGNOSIS — I4891 Unspecified atrial fibrillation: Secondary | ICD-10-CM

## 2013-02-16 DIAGNOSIS — D62 Acute posthemorrhagic anemia: Secondary | ICD-10-CM | POA: Diagnosis present

## 2013-02-16 DIAGNOSIS — E8770 Fluid overload, unspecified: Secondary | ICD-10-CM | POA: Diagnosis present

## 2013-02-16 DIAGNOSIS — J9620 Acute and chronic respiratory failure, unspecified whether with hypoxia or hypercapnia: Secondary | ICD-10-CM | POA: Diagnosis present

## 2013-02-16 DIAGNOSIS — F411 Generalized anxiety disorder: Secondary | ICD-10-CM | POA: Diagnosis present

## 2013-02-16 DIAGNOSIS — E1129 Type 2 diabetes mellitus with other diabetic kidney complication: Secondary | ICD-10-CM | POA: Diagnosis present

## 2013-02-16 DIAGNOSIS — I120 Hypertensive chronic kidney disease with stage 5 chronic kidney disease or end stage renal disease: Secondary | ICD-10-CM | POA: Diagnosis present

## 2013-02-16 DIAGNOSIS — M109 Gout, unspecified: Secondary | ICD-10-CM | POA: Diagnosis present

## 2013-02-16 DIAGNOSIS — E1149 Type 2 diabetes mellitus with other diabetic neurological complication: Secondary | ICD-10-CM | POA: Diagnosis present

## 2013-02-16 DIAGNOSIS — R079 Chest pain, unspecified: Secondary | ICD-10-CM

## 2013-02-16 DIAGNOSIS — I959 Hypotension, unspecified: Secondary | ICD-10-CM | POA: Diagnosis not present

## 2013-02-16 DIAGNOSIS — Z992 Dependence on renal dialysis: Secondary | ICD-10-CM

## 2013-02-16 DIAGNOSIS — J449 Chronic obstructive pulmonary disease, unspecified: Secondary | ICD-10-CM | POA: Diagnosis present

## 2013-02-16 DIAGNOSIS — F329 Major depressive disorder, single episode, unspecified: Secondary | ICD-10-CM | POA: Diagnosis present

## 2013-02-16 DIAGNOSIS — D631 Anemia in chronic kidney disease: Secondary | ICD-10-CM | POA: Diagnosis present

## 2013-02-16 DIAGNOSIS — N186 End stage renal disease: Secondary | ICD-10-CM | POA: Diagnosis present

## 2013-02-16 DIAGNOSIS — E1142 Type 2 diabetes mellitus with diabetic polyneuropathy: Secondary | ICD-10-CM | POA: Diagnosis present

## 2013-02-16 DIAGNOSIS — I480 Paroxysmal atrial fibrillation: Secondary | ICD-10-CM

## 2013-02-16 DIAGNOSIS — N2581 Secondary hyperparathyroidism of renal origin: Secondary | ICD-10-CM | POA: Diagnosis present

## 2013-02-16 DIAGNOSIS — F3289 Other specified depressive episodes: Secondary | ICD-10-CM | POA: Diagnosis present

## 2013-02-16 DIAGNOSIS — I2510 Atherosclerotic heart disease of native coronary artery without angina pectoris: Secondary | ICD-10-CM | POA: Diagnosis present

## 2013-02-16 DIAGNOSIS — R0600 Dyspnea, unspecified: Secondary | ICD-10-CM

## 2013-02-16 DIAGNOSIS — R0609 Other forms of dyspnea: Secondary | ICD-10-CM

## 2013-02-16 DIAGNOSIS — M129 Arthropathy, unspecified: Secondary | ICD-10-CM | POA: Diagnosis present

## 2013-02-16 DIAGNOSIS — Z66 Do not resuscitate: Secondary | ICD-10-CM | POA: Diagnosis present

## 2013-02-16 DIAGNOSIS — K219 Gastro-esophageal reflux disease without esophagitis: Secondary | ICD-10-CM | POA: Diagnosis present

## 2013-02-16 DIAGNOSIS — J4489 Other specified chronic obstructive pulmonary disease: Secondary | ICD-10-CM | POA: Diagnosis present

## 2013-02-16 DIAGNOSIS — E8779 Other fluid overload: Principal | ICD-10-CM

## 2013-02-16 HISTORY — DX: Unspecified atrial fibrillation: I48.91

## 2013-02-16 HISTORY — DX: Gastrointestinal hemorrhage, unspecified: K92.2

## 2013-02-16 LAB — CBC WITH DIFFERENTIAL/PLATELET
Basophils Absolute: 0.1 10*3/uL (ref 0.0–0.1)
Basophils Relative: 0 % (ref 0–1)
Eosinophils Absolute: 0.3 10*3/uL (ref 0.0–0.7)
Eosinophils Relative: 2 % (ref 0–5)
HCT: 28.6 % — ABNORMAL LOW (ref 39.0–52.0)
MCH: 30.6 pg (ref 26.0–34.0)
MCHC: 30.8 g/dL (ref 30.0–36.0)
MCV: 99.3 fL (ref 78.0–100.0)
Monocytes Absolute: 1 10*3/uL (ref 0.1–1.0)
Neutro Abs: 15.9 10*3/uL — ABNORMAL HIGH (ref 1.7–7.7)
RDW: 16.5 % — ABNORMAL HIGH (ref 11.5–15.5)

## 2013-02-16 LAB — COMPREHENSIVE METABOLIC PANEL
AST: 19 U/L (ref 0–37)
Albumin: 2.6 g/dL — ABNORMAL LOW (ref 3.5–5.2)
BUN: 32 mg/dL — ABNORMAL HIGH (ref 6–23)
Calcium: 8.7 mg/dL (ref 8.4–10.5)
Creatinine, Ser: 5.56 mg/dL — ABNORMAL HIGH (ref 0.50–1.35)
Total Protein: 6.4 g/dL (ref 6.0–8.3)

## 2013-02-16 LAB — PROTIME-INR
INR: 1.25 (ref 0.00–1.49)
Prothrombin Time: 15.4 seconds — ABNORMAL HIGH (ref 11.6–15.2)

## 2013-02-16 LAB — POCT I-STAT TROPONIN I: Troponin i, poc: 0.1 ng/mL (ref 0.00–0.08)

## 2013-02-16 LAB — TROPONIN I: Troponin I: <0.3 ng/mL (ref <0.30)

## 2013-02-16 MED ORDER — DOXERCALCIFEROL 0.5 MCG PO CAPS
2.0000 ug | ORAL_CAPSULE | ORAL | Status: DC
  Administered 2013-02-16 – 2013-02-21 (×2): 2 ug via ORAL
  Filled 2013-02-16 (×8): qty 4

## 2013-02-16 MED ORDER — SODIUM CHLORIDE 0.9 % IV SOLN
62.5000 mg | INTRAVENOUS | Status: DC
  Administered 2013-02-16 (×2): 62.5 mg via INTRAVENOUS
  Filled 2013-02-16: qty 5

## 2013-02-16 MED ORDER — SODIUM CHLORIDE 0.9 % IJ SOLN
3.0000 mL | Freq: Two times a day (BID) | INTRAMUSCULAR | Status: DC
  Administered 2013-02-16 – 2013-02-21 (×11): 3 mL via INTRAVENOUS

## 2013-02-16 MED ORDER — RENA-VITE PO TABS
1.0000 | ORAL_TABLET | Freq: Every day | ORAL | Status: DC
  Administered 2013-02-16 – 2013-02-21 (×6): 1 via ORAL
  Filled 2013-02-16 (×9): qty 1

## 2013-02-16 MED ORDER — SODIUM CHLORIDE 0.9 % IV SOLN
62.5000 mg | INTRAVENOUS | Status: DC
  Filled 2013-02-16: qty 5

## 2013-02-16 MED ORDER — PANTOPRAZOLE SODIUM 40 MG PO TBEC
40.0000 mg | DELAYED_RELEASE_TABLET | Freq: Every day | ORAL | Status: DC
  Administered 2013-02-16 – 2013-02-22 (×7): 40 mg via ORAL
  Filled 2013-02-16 (×6): qty 1

## 2013-02-16 MED ORDER — TAMSULOSIN HCL 0.4 MG PO CAPS
0.4000 mg | ORAL_CAPSULE | Freq: Every day | ORAL | Status: DC
  Administered 2013-02-16 – 2013-02-21 (×6): 0.4 mg via ORAL
  Filled 2013-02-16 (×7): qty 1

## 2013-02-16 MED ORDER — AMIODARONE HCL 200 MG PO TABS
200.0000 mg | ORAL_TABLET | Freq: Every day | ORAL | Status: DC
  Administered 2013-02-16 – 2013-02-22 (×7): 200 mg via ORAL
  Filled 2013-02-16 (×7): qty 1

## 2013-02-16 MED ORDER — METOPROLOL TARTRATE 12.5 MG HALF TABLET
12.5000 mg | ORAL_TABLET | Freq: Two times a day (BID) | ORAL | Status: DC
  Administered 2013-02-16: 12.5 mg via ORAL
  Filled 2013-02-16 (×3): qty 1

## 2013-02-16 MED ORDER — DARBEPOETIN ALFA-POLYSORBATE 100 MCG/0.5ML IJ SOLN
100.0000 ug | INTRAMUSCULAR | Status: DC
  Administered 2013-02-18: 100 ug via INTRAVENOUS
  Filled 2013-02-16: qty 0.5

## 2013-02-16 MED ORDER — DARBEPOETIN ALFA-POLYSORBATE 100 MCG/0.5ML IJ SOLN: 100.0000 ug | INTRAMUSCULAR | Status: DC

## 2013-02-16 MED ORDER — GABAPENTIN 300 MG PO CAPS
300.0000 mg | ORAL_CAPSULE | Freq: Two times a day (BID) | ORAL | Status: DC
  Administered 2013-02-16 – 2013-02-22 (×12): 300 mg via ORAL
  Filled 2013-02-16 (×13): qty 1

## 2013-02-16 MED ORDER — ASPIRIN EC 325 MG PO TBEC
325.0000 mg | DELAYED_RELEASE_TABLET | Freq: Every day | ORAL | Status: DC
  Administered 2013-02-16 – 2013-02-22 (×7): 325 mg via ORAL
  Filled 2013-02-16 (×7): qty 1

## 2013-02-16 MED ORDER — ACETAMINOPHEN 500 MG PO TABS
500.0000 mg | ORAL_TABLET | ORAL | Status: DC | PRN
  Administered 2013-02-18 – 2013-02-22 (×3): 500 mg via ORAL
  Filled 2013-02-16 (×3): qty 1

## 2013-02-16 MED ORDER — ENOXAPARIN SODIUM 40 MG/0.4ML ~~LOC~~ SOLN
40.0000 mg | SUBCUTANEOUS | Status: DC
  Administered 2013-02-16: 40 mg via SUBCUTANEOUS
  Filled 2013-02-16 (×2): qty 0.4

## 2013-02-16 MED ORDER — ALLOPURINOL 100 MG PO TABS
100.0000 mg | ORAL_TABLET | Freq: Two times a day (BID) | ORAL | Status: DC
  Administered 2013-02-16 – 2013-02-22 (×12): 100 mg via ORAL
  Filled 2013-02-16 (×13): qty 1

## 2013-02-16 MED ORDER — INSULIN GLARGINE 100 UNIT/ML ~~LOC~~ SOLN
15.0000 [IU] | Freq: Every day | SUBCUTANEOUS | Status: DC
  Administered 2013-02-17 – 2013-02-21 (×5): 15 [IU] via SUBCUTANEOUS
  Filled 2013-02-16 (×7): qty 0.15

## 2013-02-16 MED ORDER — INSULIN ASPART 100 UNIT/ML ~~LOC~~ SOLN
0.0000 [IU] | Freq: Three times a day (TID) | SUBCUTANEOUS | Status: DC
  Administered 2013-02-17: 5 [IU] via SUBCUTANEOUS
  Administered 2013-02-18: 2 [IU] via SUBCUTANEOUS
  Administered 2013-02-18 – 2013-02-21 (×5): 1 [IU] via SUBCUTANEOUS

## 2013-02-16 MED ORDER — ONDANSETRON HCL 4 MG/2ML IJ SOLN
4.0000 mg | Freq: Four times a day (QID) | INTRAMUSCULAR | Status: DC | PRN
  Administered 2013-02-21: 4 mg via INTRAVENOUS
  Filled 2013-02-16: qty 2

## 2013-02-16 MED ORDER — SEVELAMER CARBONATE 800 MG PO TABS
800.0000 mg | ORAL_TABLET | Freq: Three times a day (TID) | ORAL | Status: DC
  Administered 2013-02-17 – 2013-02-22 (×14): 800 mg via ORAL
  Filled 2013-02-16 (×20): qty 1

## 2013-02-16 MED ORDER — DOXERCALCIFEROL 4 MCG/2ML IV SOLN
INTRAVENOUS | Status: AC
  Administered 2013-02-16: 2 ug
  Filled 2013-02-16: qty 2

## 2013-02-16 MED ORDER — ONDANSETRON HCL 4 MG PO TABS: 4.0000 mg | ORAL_TABLET | Freq: Four times a day (QID) | ORAL | Status: DC | PRN

## 2013-02-16 MED ORDER — TRAMADOL HCL 50 MG PO TABS
50.0000 mg | ORAL_TABLET | Freq: Three times a day (TID) | ORAL | Status: DC | PRN
  Administered 2013-02-17 – 2013-02-21 (×6): 50 mg via ORAL
  Filled 2013-02-16 (×5): qty 1

## 2013-02-16 NOTE — Progress Notes (Signed)
Alert/oriented Has shunt for dialysis on Lt arm positive thrill/bruit. In bed off to Dialysis. Report given to dialysis RN. Called admission RN who will try to admit pt in Dialysis.

## 2013-02-16 NOTE — ED Provider Notes (Addendum)
History    CSN: [id number] Arrival date & time 02/16/13  4098  First MD Initiated Contact with Patient 02/16/13 [phone]     Chief Complaint  Patient presents with  . Shortness of Breath   (Consider location/radiation/quality/duration/timing/severity/associated sxs/prior Treatment) Patient is a 72 y.o. male presenting with shortness of breath. The history is provided by the patient.  Shortness of Breath Severity:  Moderate Onset quality:  Gradual Duration:  12 hours Timing:  Constant Progression:  Worsening Chronicity:  New Context comment:  States recent CABG, cartotid endart and went home from rehab yesterday.  CP intermittently on the right chest last few days and SOB starting yesterday afternoon Relieved by:  Oxygen Worsened by:  Activity (lying down) Ineffective treatments:  None tried Associated symptoms: chest pain   Associated symptoms: no abdominal pain, no cough, no fever, no sputum production, no syncope, no vomiting and no wheezing   Chest pain:    Quality:  Aching and sharp   Severity:  Moderate   Onset quality:  Sudden   Duration:  3 days   Timing:  Intermittent (in the right lower chest.  worsened with exertion and resolves with rest)   Progression:  Resolved   Chronicity:  New Risk factors comment:  Esrd  Past Medical History  Diagnosis Date  . Coronary artery disease     a. hx of MI 1992; b.echo 12/02/12: Mild LVH, EF 50%, grade 1 diastolic dysfunction, mild MR, mild LAE, PASP 22.  c.  Myoview 12/09/12: Inferior defect with inferior and septal HK (favor prior MI), EF 47%  . Diabetes mellitus   . Hypertension   . Arthritis   . Carotid artery disease 09/2012    was due for R CEA in March 2014.   . Gastritis 2012 and 09/2011    treated for H Pylori in 12/2011  . Diabetic neuropathy   . Osteoporosis   . COPD (chronic obstructive pulmonary disease)   . Anxiety   . Depression   . ESRD (end stage renal disease)     M-W-Sa  [name]  . Diabetic  nephropathy   . GERD (gastroesophageal reflux disease)   . Neuromuscular disorder     perphieal neurotherapy  . Anemia    Past Surgical History  Procedure Laterality Date  . Coronary stent placement    . Hip surgery      both hips  . Av fistula placement      Left arm  . Cardiac catheterization    . Joint replacement Bilateral   . Eye surgery Bilateral     cararacts  . Coronary artery bypass graft N/A 02/03/2013    Procedure: CORONARY ARTERY BYPASS GRAFTING (CABG);  Surgeon: Loreli Slot, MD;  Location: [facility] OR;  Service: Open Heart Surgery;  Laterality: N/A;  . Endarterectomy N/A 02/03/2013    Procedure: ENDARTERECTOMY CAROTID;  Surgeon: Pryor Ochoa, MD;  Location: [facility] OR;  Service: Vascular;  Laterality: N/A;   Family History  Problem Relation Age of Onset  . Heart attack Father 86  . Heart disease Father   . Stroke Mother   . Hypertension Mother   . Other Mother     varicose veins  . Stroke Maternal Grandmother   . Hypertension Brother    History  Substance Use Topics  . Smoking status: Former Smoker -- 1.00 packs/day for 25 years    Types: Cigarettes    Start date: 08/18/1965    Quit date: 08/18/1990  . Smokeless tobacco:  Not on file  . Alcohol Use: Yes     Comment: occasionally    Review of Systems  Constitutional: Negative for fever.  Respiratory: Positive for shortness of breath. Negative for cough, sputum production and wheezing.   Cardiovascular: Positive for chest pain. Negative for syncope.  Gastrointestinal: Negative for vomiting and abdominal pain.  All other systems reviewed and are negative.    Allergies  Statins  Home Medications   Current Outpatient Rx  Name  Route  Sig  Dispense  Refill  . acetaminophen (TYLENOL) 500 MG tablet   Oral   Take 1 tablet (500 mg total) by mouth every 4 (four) hours as needed.   30 tablet   0   . allopurinol (ZYLOPRIM) 100 MG tablet   Oral   Take 100 mg by mouth 2 (two) times daily.          [name]  amiodarone (PACERONE) 200 MG tablet   Oral   Take 200 mg by mouth daily.         [name] aspirin EC 325 MG EC tablet   Oral   Take 1 tablet (325 mg total) by mouth daily.   30 tablet   0   . darbepoetin (ARANESP) 100 MCG/0.5ML SOLN   Intravenous   Inject 0.5 mLs (100 mcg total) into the vein every Friday with hemodialysis.   4.2 mL      . fish oil-omega-3 fatty acids 1000 MG capsule   Oral   Take 1 g by mouth 2 (two) times daily.          [name] gabapentin (NEURONTIN) 300 MG capsule   Oral   Take 1 capsule (300 mg total) by mouth 2 (two) times daily.         . insulin glargine (LANTUS) 100 UNIT/ML injection   Subcutaneous   Inject 15 Units into the skin at bedtime.         . lidocaine-prilocaine (EMLA) cream   Topical   Apply 1 application topically every Monday, Wednesday, and Friday.          . metoprolol tartrate (LOPRESSOR) 12.5 mg TABS   Oral   Take 0.5 tablets (12.5 mg total) by mouth 2 (two) times daily.         . Multiple Vitamins-Minerals (MULTIVITAMIN WITH MINERALS) tablet   Oral   Take 1 tablet by mouth daily.         . multivitamin (RENA-VIT) TABS tablet   Oral   Take 1 tablet by mouth at bedtime.          . pantoprazole (PROTONIX) 40 MG tablet   Oral   Take 40 mg by mouth daily.         . sevelamer carbonate (RENVELA) 800 MG tablet   Oral   Take 800 mg by mouth 3 (three) times daily with meals.         . tamsulosin (FLOMAX) 0.4 MG CAPS   Oral   Take 0.4 mg by mouth at bedtime.         . traMADol (ULTRAM) 50 MG tablet   Oral   Take 1 tablet (50 mg total) by mouth every 12 (twelve) hours as needed for pain.   30 tablet   0    BP 97/51  Pulse 80  Temp(Src) 98.3 F (36.8 C) (Oral)  Resp 18  SpO2 93% Physical Exam  Nursing note and vitals reviewed. Constitutional: He is oriented to person, place, and time. He appears well-developed  and well-nourished. No distress.  HENT:  Head: Normocephalic and atraumatic.  Mouth/Throat:  Oropharynx is clear and moist.  Eyes: Conjunctivae and EOM are normal. Pupils are equal, round, and reactive to light.  Neck: Normal range of motion. Neck supple.  Cardiovascular: Normal rate, regular rhythm and intact distal pulses.   No murmur heard. Pulmonary/Chest: Effort normal. No respiratory distress. He has decreased breath sounds in the left lower field. He has no wheezes. He has rales in the right lower field and the left middle field. He exhibits tenderness.  Well healing midline sternotomy scar.  Mild pain with palpation of the right lower lateral ribs  Abdominal: Soft. He exhibits no distension. There is no tenderness. There is no rebound and no guarding.  Musculoskeletal: Normal range of motion. He exhibits edema. He exhibits no tenderness.  Diffuse edema of the abd, and lower ext.  Healing ecchymosis and surgical wounds to thelower ext.  Neurological: He is alert and oriented to person, place, and time.  Skin: Skin is warm and dry. No rash noted. No erythema.  Psychiatric: He has a normal mood and affect. His behavior is normal.    ED Course  Procedures (including critical care time) Labs Reviewed  CBC WITH DIFFERENTIAL - Abnormal; Notable for the following:    WBC 18.2 (*)    RBC 2.88 (*)    Hemoglobin 8.8 (*)    HCT 28.6 (*)    RDW 16.5 (*)    Neutrophils Relative % 87 (*)    Neutro Abs 15.9 (*)    Lymphocytes Relative 6 (*)    All other components within normal limits  COMPREHENSIVE METABOLIC PANEL - Abnormal; Notable for the following:    Sodium 131 (*)    Chloride 91 (*)    Glucose, Bld 106 (*)    BUN 32 (*)    Creatinine, Ser 5.56 (*)    Albumin 2.6 (*)    Alkaline Phosphatase 177 (*)    GFR calc non Af Amer 9 (*)    GFR calc Af Amer 11 (*)    All other components within normal limits  PROTIME-INR - Abnormal; Notable for the following:    Prothrombin Time 15.4 (*)    All other components within normal limits  POCT I-STAT TROPONIN I - Abnormal; Notable  for the following:    Troponin i, poc 0.10 (*)    All other components within normal limits   Dg Chest 2 View  02/16/2013   *RADIOLOGY REPORT*  Clinical Data: Chest pain and shortness of breath.  Recent open- heart surgery.  CHEST - 2 VIEW  Comparison: 02/08/2013.  Findings: Very poor inspiration.  Increased bilateral pleural fluid.  No gross change in enlargement of the cardiac silhouette and stable post CABG changes.  Extensive bilateral shoulder degenerative changes and elevation of the left hemidiaphragm are again demonstrated.  IMPRESSION:  1.  Increased bilateral pleural fluid. 2.  Stable cardiomegaly.   Original Report Authenticated By: Beckie Salts, M.D.   Dg Abd 1 View  02/14/2013   *RADIOLOGY REPORT*  Clinical Data: Nausea and vomiting.  ABDOMEN - 1 VIEW  Comparison: Single view of the chest 02/08/2013.  Findings: The bowel gas pattern is unremarkable.  There appears be airspace opacity  and a left pleural effusion as seen on the prior plain films.  IMPRESSION:  1.  Benign appearing abdomen. 2.  Likely left pleural effusion and basilar airspace disease.   Original Report Authenticated By: Holley Dexter, M.D.   Date:  02/16/2013  Rate: 75  Rhythm: normal sinus rhythm  QRS Axis: normal  Intervals: normal  ST/T Wave abnormalities: nonspecific T wave changes  Conduction Disutrbances:none  Narrative Interpretation:   Old EKG Reviewed: unchanged   1. Fluid overload   2. End stage renal disease   3. Chest pain     MDM   Patient presents to complaint of shortness of breath and chest pain. He states the chest pain has been intermittent for the last few days but the shortness of breath worsened last night. When EMS arrive pt sating 82%. Patient is status post CABG and carotid endarterectomy 3 weeks ago and went home from rehabilitation yesterday. Also patient is a dialysis patient and last dialysis was on Monday 3002 for dialysis today. He is awake and alert but does appear mildly short of  breath. He has decreased breath sounds on the left and rales in the right. He has diffuse lower extremity edema with healing ecchymosis and well-healing surgical wounds.  Concern for congestive heart failure versus fluid overload due to dialysis versus ACS. Patient is mildly hypotensive here with a blood pressure of 97/51 however looking back over prior records for the last 3 weeks this is been a normal blood pressure for him. He states he does not wear oxygen at home but is requiring 2 L here and feels much better.  CBC, CMP, INR, chest x-ray, EKG, troponin pending.  11:53 AM Leukocytosis of 18,000 with an unknown source and chest x-ray consistent with bilateral pleural effusions. CMP with typical end-stage renal disease but normal potassium and troponin today at 0.10 which is most likely leak and fluid overload. Will discuss patient with cardiology if he needs to be admitted for a rule out, dialysis and in sure improvement in shortness of breath before returning home.  Gwyneth Sprout, MD 02/16/13 1154  Gwyneth Sprout, MD 02/16/13 1209

## 2013-02-16 NOTE — ED Notes (Signed)
Called report  But as per the RN she will send rapid response nurse to check the pt before excepting the pt.

## 2013-02-16 NOTE — Progress Notes (Signed)
Pt admitted from ED per stretcher . Alert/oriented PLaced on telemonitor and VS done on 02 2 Ln/c sat 94 %

## 2013-02-16 NOTE — Progress Notes (Addendum)
      [address]       [name] [id number]             [phone]             Subjective: He is s/p CABG x 5 and right CEA on 02/03/2013. Patient was just discharged to SNF yesterday. He apparently became more short of breath and his oxygen saturation decreased at the facility earlier this morning.Patient is asking for HD, as his normal HD days are Monday, Wednesday, and Friday. Denies abdominal pain,nausea or emesis. Has some abdominal distention. Has had previous bowel movements.  Objective: Vital signs in last 24 hours: Temp:  [98.3 F (36.8 C)] 98.3 F (36.8 C) (07/02 0957) Pulse Rate:  [74-80] 80 (07/02 1400) Cardiac Rhythm:  [-]  Resp:  [14-35] 18 (07/02 1400) BP: (91-111)/(51-63) 108/59 mmHg (07/02 1400) SpO2:  [92 %-98 %] 98 % (07/02 1400)   Current Weight  02/15/13 79.9 kg (176 lb 2.4 oz)       Physical Exam:  Cardiovascular: RRR Pulmonary: Diminished at bases bilaterally; no rales, wheezes, or rhonchi. Abdomen: Soft, non tender, mildly distended hypo active bowel sounds noted. Extremities: Bilateral lower extremity edema. Hematoma of right lower extremity Wounds: Clean and dry.  No erythema or signs of infection. Skin edge necrosis of proximal sternal wound.   Lab Results: CBC: Recent Labs  02/15/13 0445 02/16/13 1054  WBC 11.5* 18.2*  HGB 7.8* 8.8*  HCT 25.2* 28.6*  PLT 343 359   BMET:  Recent Labs  02/15/13 0445 02/16/13 1054  NA 134* 131*  K 4.3 4.8  CL 96 91*  CO2 30 28  GLUCOSE 111* 106*  BUN 24* 32*  CREATININE 3.65* 5.56*  CALCIUM 8.5 8.7    PT/INR:  Lab Results  Component Value Date   INR 1.25 02/16/2013   INR 1.48 02/03/2013   INR 1.04 02/01/2013   ABG:  INR: Will add last result for INR, ABG once components are confirmed Will add last 4 CBG results once components are confirmed  Assessment/Plan:  1. CV - SR.  2.  Pulmonary - On Venti mask. Oxygen saturation is 100%. CXR bilateral pleural effusions,  elevation of left hemidiaphragm (seen on previous x rays), large gastric air bubble, and cardiomegaly. Await CT scan results to rule out PE and further assess pleural effusions 3. ESRD-HD days are Monday, Wednesday, and Friday. Per nephrology 4.  Acute blood loss anemia - H and H 8.8 and 28.6.  5.Leukocystosis but remains afebrile. No signs of wound infection. Await blood and urine cultures 6.GI- gastric air bubble seen on x ray. No other abnormality noted  ZIMMERMAN,DONIELLE MPA-C 02/16/2013,2:07 PM   Will need dialysis today, chest xray poor quality but has dilated stomach and elevated   Left hemidiaphragm Consider ct of chest to ro/pe after dialysis if not improvement in sob Serial exams on abdomen Discussed with medical service who has admitted patient. I have seen and examined [name] and agree with the above assessment  and plan.  Delight Ovens MD Beeper [phone] Office [phone] 02/16/2013 2:33 PM

## 2013-02-16 NOTE — H&P (Signed)
Date: 02/16/2013               Patient Name:  Nicholas [name] MRN: [id number]  DOB: 11/09/1940 Age / Sex: 72 y.o., male   PCP: Nicholas [name]         Medical Service: Internal Medicine Teaching Service         Attending Physician: Dr. [name], MD    First Contact: Dr. Aundria [name] Pager: [phone]  Second Contact: Dr. Manson [name] Pager: [phone]       After Hours (After 5p/  First Contact Pager: [phone]  weekends / holidays): Second Contact Pager: [phone]   Chief Complaint: shortness of breath, "chest pain"   History of Present Illness:  Nicholas [name] is a 72 y/o man with PMH of ESRD on HD (since 3/14, MWF), multivessel CAD (MI in 1992, NSTEMI in 3/14; echo in 4/14 showed EF of 50%) and right carotid stenosis s/p CABGx5/RCEA on 02/03/13, atrial fibrillation with RVR in 3/14 (not on anticoagulation), GI bleed in 3/14, COPD, DM2, HTN who presents to the ED from rehab with shortness of breath and chest pain.  Pt has been in the hospital until yesterday afternoon for the aforementioned CABGx5/RCEA surgeries and was doing better aside from increased dyspnea with activity.  Yesterday afternoon/evening, he gradually developed worsening shortness of breath, much greater than his baseline.  He is usually able to get out of bed as well as ambulate with a walker though he has been unable to do so since yesterday.  Pt had been on 2L O2 during his last hospitalization though this was discontinued at discharge.  Pt's nursing home assistant noted that he was much more short of breath this morning than usual which led to his presentation at the ED.   During transport in ambulance last night, pt states that he was "bumped around" and seems to have injured his right side under his arm.  The pain is sharp and worse with movement, but the area is not tender to touch and does not worsen with inspiration.  Furthermore, pt states that his sternotomy scar is quite sore though he does not have anginal pain at rest or with  activity since his surgery.    Pt is dysnpeic when speaking and endorses orthopnea but no PND. He also denies fever, cough/sputum production, confusion, chest pain similar to previous MI, abdominal pain, increased abdominal girth, lower extremity edema.   Meds: Current Facility-Administered Medications  Medication Dose Route Frequency Provider Last Rate Last Dose  . [START ON 02/23/2013] darbepoetin (ARANESP) injection 100 mcg  100 mcg Intravenous Q Wed-HD Nicholas Pore, PA-C      . doxercalciferol (HECTOROL) capsule 2 mcg  2 mcg Oral 3 times weekly Nicholas Pore, PA-C      . Melene Muller ON 02/23/2013] ferric gluconate (NULECIT) 62.5 mg in sodium chloride 0.9 % 100 mL IVPB  62.5 mg Intravenous Q Wed-HD Nicholas Pore, PA-C       Current Outpatient Prescriptions  Medication Sig Dispense Refill  . acetaminophen (TYLENOL) 500 MG tablet Take 500 mg by mouth every 4 (four) hours as needed for pain.      [name] allopurinol (ZYLOPRIM) 100 MG tablet Take 100 mg by mouth 2 (two) times daily.       [name] amiodarone (PACERONE) 200 MG tablet Take 200 mg by mouth daily.      . fish oil-omega-3 fatty acids 1000 MG capsule Take 1 g by mouth 2 (two) times daily.       [name]  gabapentin (NEURONTIN) 300 MG capsule Take 1 capsule (300 mg total) by mouth 2 (two) times daily.      . insulin aspart (NOVOLOG) 100 UNIT/ML injection Inject 0-8 Units into the skin 3 (three) times daily before meals. 0-99= no insulin; 100-150= 2 units; 151-200=4 units; 201-250=6 units; greater than 250= 8 units.      . insulin glargine (LANTUS) 100 UNIT/ML injection Inject 15 Units into the skin at bedtime.      . metoprolol tartrate (LOPRESSOR) 25 MG tablet Take 12.5 mg by mouth 2 (two) times daily.      . Multiple Vitamins-Minerals (MULTIVITAMIN WITH MINERALS) tablet Take 1 tablet by mouth at bedtime.       . multivitamin (RENA-VIT) TABS tablet Take 1 tablet by mouth at bedtime.       . pantoprazole (PROTONIX) 40 MG tablet Take 40 mg by mouth daily.       . tamsulosin (FLOMAX) 0.4 MG CAPS Take 0.4 mg by mouth at bedtime.      . traMADol (ULTRAM) 50 MG tablet Take 50 mg by mouth every 8 (eight) hours as needed for pain.      [name] aspirin EC 325 MG EC tablet Take 1 tablet (325 mg total) by mouth daily.  30 tablet  0  . darbepoetin (ARANESP) 100 MCG/0.5ML SOLN Inject 0.5 mLs (100 mcg total) into the vein every Friday with hemodialysis.  4.2 mL    . lidocaine-prilocaine (EMLA) cream Apply 1 application topically every Monday, Wednesday, and Friday.       . sevelamer carbonate (RENVELA) 800 MG tablet Take 800 mg by mouth 3 (three) times daily with meals.        Allergies: Allergies as of 02/16/2013 - Review Complete 02/16/2013  Allergen Reaction Noted  . Statins Other (See Comments) 10/19/2012   Past Medical History  Diagnosis Date  . Coronary artery disease     a. hx of MI 1992; b. 10/2012 NSTEMI (no cath 2/2 GIB);  c. 11/2012 MV: Inf defect w inf and septal HK (favor prior MI), EF 47%;  d. 12/2012 Cath: Severe 3vd, EF 45-50%;  e. 01/2013 CABG x 5 (LIMA->LAD, VG->D1, VG->OM1->OM2, VG->PDA)  . Diabetes mellitus   . Hypertension   . Arthritis   . Carotid artery disease     a. 01/2013 doppler: >80% RICA stenosis, <39% LICA stenosis;  b. 01/2013 R CEA.  . Gastritis 2012 and 09/2011    treated for H Pylori in 12/2011  . Diabetic neuropathy   . Osteoporosis   . COPD (chronic obstructive pulmonary disease)   . Anxiety   . Depression   . ESRD (end stage renal disease)     M-W-Sa  [name]  . Diabetic nephropathy   . GERD (gastroesophageal reflux disease)   . Neuromuscular disorder     peripheral neurotherapy  . Anemia   . A-fib     a. Dx 10/2012, on amio, no anticoagulation 2/2 GIB 10/2012.  [name] GIB (gastrointestinal bleeding)     a. 10/2012, awaiting colonoscopy.   Past Surgical History  Procedure Laterality Date  . Coronary stent placement    . Hip surgery      both hips  . Av fistula placement      Left arm  . Cardiac catheterization     . Joint replacement Bilateral   . Eye surgery Bilateral     cararacts  . Coronary artery bypass graft N/A 02/03/2013    Procedure: CORONARY ARTERY BYPASS GRAFTING (CABG);  Surgeon: Loreli Slot, MD;  Location[facility] OR;  Service: Open Heart Surgery;  Laterality: N/A;  . Endarterectomy N/A 02/03/2013    Procedure: ENDARTERECTOMY CAROTID;  Surgeon: Pryor Ochoa, MD;  Location: [facility] OR;  Service: Vascular;  Laterality: N/A;   Family History  Problem Relation Age of Onset  . Heart attack Father 63  . Heart disease Father   . Stroke Mother   . Hypertension Mother   . Other Mother     varicose veins  . Stroke Maternal Grandmother   . Hypertension Brother    History   Social History  . Marital Status: Married    Spouse Name: N/A    Number of Children: N/A  . Years of Education: N/A   Occupational History  . other     retired [employer] architect.   Social History Main Topics  . Smoking status: Former Smoker -- 1.00 packs/day for 25 years    Types: Cigarettes    Start date: 08/18/1965    Quit date: 08/18/1990  . Smokeless tobacco: Not on file  . Alcohol Use: Yes     Comment: occasionally  . Drug Use: No  . Sexually Active: Not on file   Other Topics Concern  . Not on file   Social History Narrative   Was living in [place] by himself prior to CABG and hopes to return there after rehab @ [facility].    Review of Systems: ROS:  General: no fevers, chills, changes in weight, changes in appetite Skin: scar on chest, neck, legs HEENT: some popping in right ear; no blurry vision, sore throat Pulm: see above CV: no anginal chest pain, palpitations Abd: no abdominal pain, nausea/vomiting, diarrhea/constipation GU: n/a (pt does not create urine) Ext: no arthralgias, myalgias Neuro: weakness since hostpialization, no numbness or tingling  Physical Exam: Blood pressure 109/51, pulse 78, temperature 98.3 F (36.8 C), temperature source Oral, resp. rate 16, SpO2  91.00%. BP 109/51  Pulse 78  Temp(Src) 98.3 F (36.8 C) (Oral)  Resp 16  SpO2 91% PEX General: alert, cooperative, mildly labored breathing HEENT: pupils equal round and reactive to light, vision grossly intact, oropharynx clear and non-erythematous  Neck: bilateral scar on right neck from previous surgery; supple, no lymphadenopathy, JVD, or carotid bruits Chest: well healing sternotomy scar; painful area alone right mid-axillary line not TTP Lungs: decreased breath sounds in left lower lung field, rales in left middle lung field and right lower lung field; increased work of respiration, no wheezes, ronchi Heart: regular rate and rhythm, no murmurs, gallops, or rubs Abdomen: soft, non-tender, non-distended, normal bowel sounds Extremities: trace non-pitting edema, scars on bilateral medial lower legs, scattered bruises;  2+ DP/PT pulses bilaterally, no cyanosis, clubbing Neurologic: alert & oriented X3, cranial nerves II-XII intact, strength grossly intact, sensation intact to light touch  Lab results: Basic Metabolic Panel:  Recent Labs  16/10/96 1205 02/15/13 0445 02/16/13 1054  NA 133* 134* 131*  K 4.4 4.3 4.8  CL 93* 96 91*  CO2 26 30 28   GLUCOSE 112* 111* 106*  BUN 35* 24* 32*  CREATININE 4.06* 3.65* 5.56*  CALCIUM 8.6 8.5 8.7  PHOS 3.3  --   --    Liver Function Tests:  Recent Labs  02/14/13 1205 02/16/13 1054  AST  --  19  ALT  --  14  ALKPHOS  --  177*  BILITOT  --  0.5  PROT  --  6.4  ALBUMIN 2.7* 2.6*   CBC:  Recent Labs  02/15/13 0445 02/16/13 1054  WBC 11.5* 18.2*  NEUTROABS  --  15.9*  HGB 7.8* 8.8*  HCT 25.2* 28.6*  MCV 98.8 99.3  PLT 343 359   CBG:  Recent Labs  02/14/13 0755 02/14/13 1442 02/14/13 1627 02/14/13 2108 02/15/13 0559 02/15/13 1111  GLUCAP 91 118* 123* 131* 110* 145*   Coagulation:  Recent Labs  02/16/13 1054  LABPROT 15.4*  INR 1.25    Imaging results:  Dg Chest 2 View  02/16/2013   *RADIOLOGY REPORT*   Clinical Data: Chest pain and shortness of breath.  Recent open- heart surgery.  CHEST - 2 VIEW  Comparison: 02/08/2013.  Findings: Very poor inspiration.  Increased bilateral pleural fluid.  No gross change in enlargement of the cardiac silhouette and stable post CABG changes.  Extensive bilateral shoulder degenerative changes and elevation of the left hemidiaphragm are again demonstrated.  IMPRESSION:  1.  Increased bilateral pleural fluid. 2.  Stable cardiomegaly.   Original Report Authenticated By: Beckie Salts, M.D.   Dg Abd 1 View  02/14/2013   *RADIOLOGY REPORT*  Clinical Data: Nausea and vomiting.  ABDOMEN - 1 VIEW  Comparison: Single view of the chest 02/08/2013.  Findings: The bowel gas pattern is unremarkable.  There appears be airspace opacity  and a left pleural effusion as seen on the prior plain films.  IMPRESSION:  1.  Benign appearing abdomen. 2.  Likely left pleural effusion and basilar airspace disease.   Original Report Authenticated By: Holley Dexter, M.D.    Other results: EKG: normal EKG, normal sinus rhythm, unchanged from previous tracings.  Assessment & Plan by Problem: Mr. Caligiuri is a 72 y/o man with PMH of ESRD on HD (since 3/14, MWF), multivessel CAD (MI in 1992, NSTEMI in 3/14; echo in 4/14 showed EF of 50%) and right carotid stenosis s/p CABGx5/RCEA on 02/03/13, atrial fibrillation with RVR in 3/14 (not on anticoagulation), GI bleed in 3/14, COPD, DM2, HTN who presents to the ED from rehab with likely fluid overload; due for dialysis today and likely deconditioned from his recent surgical hospitalization.   1. Dyspnea- Pt much more dyspneic than baseline; of note, due for HD today. CXR shows bilateral pleural effusions increased from prior.  On exam, pt clearly has increased work of respiration and decreased lung sounds on the left and rales on right and left lower lung fields.  Given his recent surgical hospitalization (discharged yesterday), he is also likely  deconditioned. Pt responding well to 2L O2 (desats to low 70s on RA but is 95%+ on 2L O2).  For these reasons, fluid overload appears to be the cause of his dyspnea.   Also coonsidered pneumonia but seems unlikely with no evidence on CXR and given no fever, lack of cough/sputum production.   Pulmonary embolus is a third consideration since patient has revised Geneva score of 6 (intermediate probability) but since volume overload is more likely will reassess pt's dyspnea after dialysis.  -patient to dialysis today; nephrology on board -repeat chest xray after dialysis  -cardiology following- Dr. Jens Som -cardiothoracic surgery aware for consideration of thoracentesis if unable to remove adequate fluid through dialysis  -continue 2L O2, keep O2 sat above 92% -up with assistance -consult PT/OT  2. Right chest wall pain, sternotomy incisional pain- pain appears to be incisional pain and musculoskeletal pain in the mid-axillary line. Ruling out ACS given history of MIs.  Initial troponin 0.10, likely due to leak of troponin due to demand ischemia  -cycle  troponins -continue aspirin, beta blocker -cardiology on board  2. ESRD- pt has been having HD since 3/14; he does not produce any urine; pt goes to dialysis MWF.  -dialysis this afternoon -nephrology on board  3.  Leukocytosis of unknown etiology- WBC count up to 18.2 from 11.5 yesterday. Could be stress response, no evidence of infection.  Unable to send urine culture since pt does not produce urine.  -blood cultures x 2  -repeat CXR after HD   4. COPD- Will closely monitor patient's respiratory status though pt does not seem to have a COPD exacerbation at this time given lack of fever and cough/sputum production.  -continue home inhalers   5. CAD- recent CABGx5 on 02/03/13 -continue home meds- metoprolol and ASA  6. A fib- discovered in 3/14; on amiodarone, but no anticoagulation given his history of GI bleed during same hospitalization  in March -continue home dose of amiodarone  7. History of GI bleed- in 3/14 -continue pantoprazole -avoid anti-coagulation  7. DM2 -SSI -continue gabapentin for neuropathic pain  8. HTN -continue home dose of metoprolol  9. FEN/GI- no fluids given current state of fluid overload; renal diet  10. DVT PPX- SCDs  11. Code status- DNR; discussed code status with patient this afternoon, and he clearly stated that he would not want CPR or other life preserving measures.  Dispo: Disposition is deferred at this time, awaiting improvement of current medical problems. Anticipated discharge in approximately 1-2 day(s).   The patient does have a current PCP Nicholas [name]) and does need an [facility] hospital follow-up appointment after discharge.  Signed: Rocco Serene, MD 02/16/2013, 3:31 PM

## 2013-02-16 NOTE — ED Notes (Signed)
Pt brought to ED by EMS with complaint of SOB and SPO2 of 83 in RA .Pt had CABG on the 19th June and was discharged from hospital yesterday.

## 2013-02-16 NOTE — Progress Notes (Signed)
Subjective:  In ER,co sob, could not make it to OP HD per Pt". Too weak"/ O2 mask on and not in Distress. Objective Vital signs in last 24 hours: Filed Vitals:   02/16/13 1200 02/16/13 1230 02/16/13 1345 02/16/13 1400  BP: 111/63 107/58  108/59  Pulse: 76 76 84 80  Temp:      TempSrc:      Resp: 26 14 21 18   SpO2: 93% 92% 100% 98%   Weight change:  No intake or output data in the 24 hours ending 02/16/13 1411 Labs: Basic Metabolic Panel:  Recent Labs Lab 02/11/13 0820 02/14/13 1205 02/15/13 0445 02/16/13 1054  NA 136 133* 134* 131*  K 4.6 4.4 4.3 4.8  CL 98 93* 96 91*  CO2 28 26 30 28   GLUCOSE 115* 112* 111* 106*  BUN 18 35* 24* 32*  CREATININE 2.49* 4.06* 3.65* 5.56*  CALCIUM 9.0 8.6 8.5 8.7  PHOS 2.3 3.3  --   --    Liver Function Tests:  Recent Labs Lab 02/11/13 0820 02/14/13 1205 02/16/13 1054  AST  --   --  19  ALT  --   --  14  ALKPHOS  --   --  177*  BILITOT  --   --  0.5  PROT  --   --  6.4  ALBUMIN 3.2* 2.7* 2.6*   No results found for this basename: LIPASE, AMYLASE,  in the last 168 hours No results found for this basename: AMMONIA,  in the last 168 hours CBC:  Recent Labs Lab 02/11/13 0820 02/14/13 1205 02/15/13 0445 02/16/13 1054  WBC 14.6* 11.5* 11.5* 18.2*  NEUTROABS  --   --   --  15.9*  HGB 8.3* 8.6* 7.8* 8.8*  HCT 26.9* 26.9* 25.2* 28.6*  MCV 97.1 95.7 98.8 99.3  PLT 372 319 343 359   Cardiac Enzymes: No results found for this basename: CKTOTAL, CKMB, CKMBINDEX, TROPONINI,  in the last 168 hours CBG:  Recent Labs Lab 02/14/13 1442 02/14/13 1627 02/14/13 2108 02/15/13 0559 02/15/13 1111  GLUCAP 118* 123* 131* 110* 145*    Iron Studies: No results found for this basename: IRON, TIBC, TRANSFERRIN, FERRITIN,  in the last 72 hours Studies/Results:    Physical Exam: General: Alert, WM NAD/but anxious/ Appropriate Heart: RRR, 1/6 sem lsb, no rub Lungs: Decreased BS at bilat bases/ otherwise clear Abdomen: Bs pos. ,obese,  soft, nontender Extremities: Dialysis Access: trace bipedal edema/ Pos. Bruit L U A AVF   Outpatient HD: MWF [facility] 4hrs F180 400/A1.5 EDW 80.5kg Bath 2K/2.25Ca LFA AVF Hect 2ug EPO 6800 Venofer 50mg /wk   Assessment/Plan   1. Dyspnea/ Volume overload sp CAGB/ R CAE= Hd today attempt  uf on HD and avoid bp drops 2. ESRD= MWF [facility]) keep on schedule today  3. Hypotension/volume- bp's low, vol excess > hold metoprolol, max UF w HD as tolerated today 4. Anemia- hgb 8.8 on aranesp 100mg  q wed hd, given 1 unit blood last  fri and receives Iron weekly hd wed 5. 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 DM- on insulin 7. Gout= colchicine 0.6mg  qd 8. AFib ( now in SR)= On Amio/ ASA/ "no Anticoag. With ho GI BLD in March    Lenny Pastel, PA-C [facility] Beeper [phone] 02/16/2013,2:11 PM  LOS: 0 days   Patient seen and examined.  Agree with assessment and plan as above. Vinson Moselle  MD Pager [phone]    Cell  (  [phone] 02/16/2013, 4:32 PM

## 2013-02-16 NOTE — Consult Note (Signed)
Patient ID: Nicholas [name] MRN: [id number], DOB/AGE: 70-Dec-1942   Admit date: 02/16/2013  Primary Physician: Daniel Nones Primary Cardiologist: Katherina Right, MD   Pt. Profile:  72 y/o male with h/o CAD s/p CABG last month along with R CEA, who was just discharged from the surgical service yesterday and presented back to the ED this AM 2/2 worsening dyspnea.  Problem List  Past Medical History  Diagnosis Date  . Coronary artery disease     a. hx of MI 1992; b. 10/2012 NSTEMI (no cath 2/2 GIB);  c. 11/2012 MV: Inf defect w inf and septal HK (favor prior MI), EF 47%;  d. 12/2012 Cath: Severe 3vd, EF 45-50%;  e. 01/2013 CABG x 5 (LIMA->LAD, VG->D1, VG->OM1->OM2, VG->PDA)  . Diabetes mellitus   . Hypertension   . Arthritis   . Carotid artery disease     a. 01/2013 doppler: >80% RICA stenosis, <39% LICA stenosis;  b. 01/2013 R CEA.  . Gastritis 2012 and 09/2011    treated for H Pylori in 12/2011  . Diabetic neuropathy   . Osteoporosis   . COPD (chronic obstructive pulmonary disease)   . Anxiety   . Depression   . ESRD (end stage renal disease)     M-W-Sa  [name]  . Diabetic nephropathy   . GERD (gastroesophageal reflux disease)   . Neuromuscular disorder     peripheral neurotherapy  . Anemia   . A-fib     a. Dx 10/2012, on amio, no anticoagulation 2/2 GIB 10/2012.  [name] GIB (gastrointestinal bleeding)     a. 10/2012, awaiting colonoscopy.    Past Surgical History  Procedure Laterality Date  . Coronary stent placement    . Hip surgery      both hips  . Av fistula placement      Left arm  . Cardiac catheterization    . Joint replacement Bilateral   . Eye surgery Bilateral     cararacts  . Coronary artery bypass graft N/A 02/03/2013    Procedure: CORONARY ARTERY BYPASS GRAFTING (CABG);  Surgeon: Loreli Slot, MD;  Location: [facility] OR;  Service: Open Heart Surgery;  Laterality: N/A;  . Endarterectomy N/A 02/03/2013    Procedure: ENDARTERECTOMY CAROTID;  Surgeon: Pryor Ochoa, MD;  Location: [facility] OR;  Service: Vascular;  Laterality: N/A;    Allergies  Allergies  Allergen Reactions  . Statins Other (See Comments)    Muscle weakness    HPI  72 y/o male with the above complex problem list.  He has a h/o CAD dating back to 1992, though generally did well over the years. He is a dialysis patient and is on Monday Wednesday Friday dialysis locally. Earlier this year, he underwent stress testing in preparation for a right carotid endarterectomy and this was normal. Unfortunately, he presented in March of 2014 to [facility] with respiratory failure, A. fib RVR, GI bleeding, and non-ST segment elevation myocardial infarction. Atrial fibrillation was managed with amiodarone but he was not placed on anticoagulation secondary to GI bleeding. GI recommended colonoscopy however also recommended cardiac stabilization first. He followup in our clinic as an outpatient and subsequently underwent repeat stress testing which now showed an inferior and septal defect with an EF of 47%. With this, decision was made to pursue diagnostic catheterization which took place in May and revealed multivessel coronary artery disease. Patient was evaluated by cardiothoracic surgery and subsequently underwent successful coronary artery bypass grafting x5 on June 19. He also  underwent a right carotid endarterectomy. Hemodialysis was continued in the postoperative setting. Postoperative course was complicated by deconditioning as well as hypoxia in the setting of left hemidiaphragm. Patient was seen by physical and occupational therapy and felt to require short-term rehabilitation. He was felt to be stable for discharge to rehabilitation yesterday.  Patient reports that since his surgery he has been dyspneic with any activity.  This morning, he was ambulated and dropped his oxygen saturations into the 80s. Decision was made to transfer him back to the ED for evaluation. He has not had any anginal pain but  does have some incisional pain. He is dyspneic with talking. He has had orthopnea but denies PND. His appetite has been normal and he has not noted any increase in abdominal girth or lower extremity edema.  Home Medications  Prior to Admission medications   Medication Sig Start Date End Date Taking? Authorizing Provider  acetaminophen (TYLENOL) 500 MG tablet Take 500 mg by mouth every 4 (four) hours as needed for pain.   Yes Historical Provider, MD  allopurinol (ZYLOPRIM) 100 MG tablet Take 100 mg by mouth 2 (two) times daily.    Yes Historical Provider, MD  amiodarone (PACERONE) 200 MG tablet Take 200 mg by mouth daily.   Yes Historical Provider, MD  fish oil-omega-3 fatty acids 1000 MG capsule Take 1 g by mouth 2 (two) times daily.    Yes Historical Provider, MD  gabapentin (NEURONTIN) 300 MG capsule Take 1 capsule (300 mg total) by mouth 2 (two) times daily. 02/15/13  Yes Donielle Margaretann Loveless, PA-C  insulin aspart (NOVOLOG) 100 UNIT/ML injection Inject 0-8 Units into the skin 3 (three) times daily before meals. 0-99= no insulin; 100-150= 2 units; 151-200=4 units; 201-250=6 units; greater than 250= 8 units.   Yes Historical Provider, MD  insulin glargine (LANTUS) 100 UNIT/ML injection Inject 15 Units into the skin at bedtime. 10/27/12  Yes Vassie Loll, MD  metoprolol tartrate (LOPRESSOR) 25 MG tablet Take 12.5 mg by mouth 2 (two) times daily.   Yes Historical Provider, MD  Multiple Vitamins-Minerals (MULTIVITAMIN WITH MINERALS) tablet Take 1 tablet by mouth at bedtime.    Yes Historical Provider, MD  multivitamin (RENA-VIT) TABS tablet Take 1 tablet by mouth at bedtime.    Yes Historical Provider, MD  pantoprazole (PROTONIX) 40 MG tablet Take 40 mg by mouth daily.   Yes Historical Provider, MD  tamsulosin (FLOMAX) 0.4 MG CAPS Take 0.4 mg by mouth at bedtime.   Yes Historical Provider, MD  traMADol (ULTRAM) 50 MG tablet Take 50 mg by mouth every 8 (eight) hours as needed for pain.   Yes Historical  Provider, MD  aspirin EC 325 MG EC tablet Take 1 tablet (325 mg total) by mouth daily. 02/15/13   Donielle Margaretann Loveless, PA-C  darbepoetin (ARANESP) 100 MCG/0.5ML SOLN Inject 0.5 mLs (100 mcg total) into the vein every Friday with hemodialysis. 02/15/13   Donielle Margaretann Loveless, PA-C  lidocaine-prilocaine (EMLA) cream Apply 1 application topically every Monday, Wednesday, and Friday.  11/03/12   Historical Provider, MD  sevelamer carbonate (RENVELA) 800 MG tablet Take 800 mg by mouth 3 (three) times daily with meals.    Historical Provider, MD   Family History  Family History  Problem Relation Age of Onset  . Heart attack Father 22  . Heart disease Father   . Stroke Mother   . Hypertension Mother   . Other Mother     varicose veins  . Stroke Maternal Grandmother   .  Hypertension Brother    Social History  History   Social History  . Marital Status: Married    Spouse Name: N/A    Number of Children: N/A  . Years of Education: N/A   Occupational History  . other     retired Naval architect.   Social History Main Topics  . Smoking status: Former Smoker -- 1.00 packs/day for 25 years    Types: Cigarettes    Start date: 08/18/1965    Quit date: 08/18/1990  . Smokeless tobacco: Not on file  . Alcohol Use: Yes     Comment: occasionally  . Drug Use: No  . Sexually Active: Not on file   Other Topics Concern  . Not on file   Social History Narrative   Was living in [place] by himself prior to CABG and hopes to return there after rehab @ [facility].    Review of Systems General:  No chills, fever, night sweats or weight changes.  Cardiovascular:  He has had incisional chest pain.  +++ dyspnea on exertion, edema, orthopnea, palpitations, paroxysmal nocturnal dyspnea. Dermatological: No rash, lesions/masses Respiratory: No cough, +++ dyspnea Urologic: No hematuria, dysuria Abdominal:   No nausea, vomiting, diarrhea, bright red blood per rectum, melena, or hematemesis Neurologic:   No visual changes, +++ wkns/diaphoresis.  No changes in mental status. All other systems reviewed and are otherwise negative except as noted above.  Physical Exam  Blood pressure 107/58, pulse 76, temperature 98.3 F (36.8 C), temperature source Oral, resp. rate 14, SpO2 92.00%.  General: Pleasant, NAD Psych: Normal affect. Neuro: Alert and oriented X 3. Moves all extremities spontaneously. HEENT: Normal  Neck: Supple without bruits or JVD.  Well healing Right CEA scar. Lungs:  Resp labored @ rest.  Absent breath sounds in left base with diminished breath sounds the rest of the way up.  Diminished breath sounds in right base, otw clear. Heart: RRR no s3, s4, or murmurs. Abdomen: Soft, non-tender, non-distended, BS + x 4.  Extremities: No clubbing, cyanosis.  Trace bilat LE edema. DP/PT/Radials 1+ and equal bilaterally.  Labs  Trop i, poc: 0.10  Lab Results  Component Value Date   WBC 18.2* 02/16/2013   HGB 8.8* 02/16/2013   HCT 28.6* 02/16/2013   MCV 99.3 02/16/2013   PLT 359 02/16/2013     Recent Labs Lab 02/16/13 1054  NA 131*  K 4.8  CL 91*  CO2 28  BUN 32*  CREATININE 5.56*  CALCIUM 8.7  PROT 6.4  BILITOT 0.5  ALKPHOS 177*  ALT 14  AST 19  GLUCOSE 106*   Radiology/Studies  Dg Chest 2 View  02/16/2013   *RADIOLOGY REPORT*  Clinical Data: Chest pain and shortness of breath.  Recent open- heart surgery.  CHEST - 2 VIEW  Comparison: 02/08/2013.  Findings: Very poor inspiration.  Increased bilateral pleural fluid.  No gross change in enlargement of the cardiac silhouette and stable post CABG changes.  Extensive bilateral shoulder degenerative changes and elevation of the left hemidiaphragm are again demonstrated.  IMPRESSION:  1.  Increased bilateral pleural fluid. 2.  Stable cardiomegaly.   Original Report Authenticated By: Beckie Salts, M.D.   ECG  Sinus rhythm, 75, no acute st/t changes.  ASSESSMENT AND PLAN  1.  CAD:  S/p CABG x 5 on 6/19.  He has had some incisional  pain but otw no chest pain/angina.  He has been dyspneic since surgery and he does not believe that this has acutely changed.  He was sent to the ED 2/2 dyspnea and desaturation noted @ [facility].  CXR shows significant effusions with left hemidiaphragm, which are likely the major contributor to his Ss.  He does have mild edema on xray as well and is pending dialysis today.  Cont previously Rx meds  - asa/bb.  He is intolerant to statins.  2.  Left Hemidiaphragm/Pleural Effusions:  As above.  Recommend CT surgery eval.  If Ss and cxr changes persist after dialysis, he may require thoracentesis.  3.  ESRD:  Due for dialysis today.   4.  Afib:  In sinus and on amio.   Cont asa.  Long term anticoagulation has been deferred as he has a h/o GIB in March with rec for colonoscopy, which is pending.  5.  Leukocytosis:  Follow.  Afebrile.  6.  DM:  Per IM.  Signed, Nicolasa Ducking, NP 02/16/2013, 1:00 PM As above, patient seen and examined. Briefly he is a 72 year old male with past medical history of coronary artery disease status post recent coronary artery bypass and graft, end-stage renal disease/dialysis dependent, paroxysmal atrial fibrillation, diabetes mellitus, hypertension, hyperlipidemia for evaluation of dyspnea. Patient underwent coronary artery bypassing graft on June 19. Since his surgery he complains of dyspnea. He was discharged yesterday and has continued to have dyspnea with low saturations and he was referred back to the emergency room today. He denies fevers, chills or productive cough. He has some residual chest soreness but no other chest pain. There is orthopnea. He denies pedal edema. Chest x-ray shows bilateral pleural effusions increased in size. Would ask nephrology to evaluate as he is due for dialysis today. Note he makes no urine. I would ask CVTS to see as he was just discharged yesterday. He may require thoracentesis if his pleural effusions do not improve with volume removal with  dialysis. Continue aspirin and beta blocker. He is intolerant to statins. Continue amiodarone for history of atrial fibrillation. Not on anticoagulation because of recent GI bleed. Nicholas [name] 1:32 PM

## 2013-02-16 NOTE — Procedures (Signed)
I was present at this dialysis session. I have reviewed the session itself and made appropriate changes.   Vinson Moselle  MD Pager [phone]    Cell  [phone] 02/16/2013, 6:12 PM

## 2013-02-17 ENCOUNTER — Inpatient Hospital Stay (HOSPITAL_COMMUNITY): Payer: Medicare Other

## 2013-02-17 LAB — BASIC METABOLIC PANEL
CO2: 29 mEq/L (ref 19–32)
Calcium: 8.7 mg/dL (ref 8.4–10.5)
Creatinine, Ser: 2.97 mg/dL — ABNORMAL HIGH (ref 0.50–1.35)
GFR calc non Af Amer: 20 mL/min — ABNORMAL LOW (ref >90)
Glucose, Bld: 98 mg/dL (ref 70–99)
Sodium: 137 mEq/L (ref 135–145)

## 2013-02-17 LAB — CBC WITH DIFFERENTIAL/PLATELET
Basophils Absolute: 0.1 10*3/uL (ref 0.0–0.1)
Basophils Relative: 0 % (ref 0–1)
Eosinophils Absolute: 0.2 10*3/uL (ref 0.0–0.7)
Eosinophils Relative: 1 % (ref 0–5)
Lymphs Abs: 0.9 10*3/uL (ref 0.7–4.0)
MCH: 30.4 pg (ref 26.0–34.0)
MCV: 97.3 fL (ref 78.0–100.0)
Neutrophils Relative %: 85 % — ABNORMAL HIGH (ref 43–77)
Platelets: 407 10*3/uL — ABNORMAL HIGH (ref 150–400)
RBC: 2.93 MIL/uL — ABNORMAL LOW (ref 4.22–5.81)
RDW: 16.6 % — ABNORMAL HIGH (ref 11.5–15.5)

## 2013-02-17 LAB — TROPONIN I: Troponin I: <0.3 ng/mL (ref <0.30)

## 2013-02-17 LAB — GLUCOSE, CAPILLARY
Glucose-Capillary: 266 mg/dL — ABNORMAL HIGH (ref 70–99)
Glucose-Capillary: 114 mg/dL — ABNORMAL HIGH (ref 70–99)
Glucose-Capillary: 231 mg/dL — ABNORMAL HIGH (ref 70–99)

## 2013-02-17 MED ORDER — HEPARIN SODIUM (PORCINE) 1000 UNIT/ML DIALYSIS
2000.0000 [IU] | INTRAMUSCULAR | Status: DC | PRN
  Administered 2013-02-17: 2000 [IU] via INTRAVENOUS_CENTRAL

## 2013-02-17 MED ORDER — HEPARIN SODIUM (PORCINE) 1000 UNIT/ML DIALYSIS: 1000.0000 [IU] | INTRAMUSCULAR | Status: DC | PRN

## 2013-02-17 MED ORDER — ALTEPLASE 2 MG IJ SOLR
2.0000 mg | Freq: Once | INTRAMUSCULAR | Status: DC | PRN
  Filled 2013-02-17: qty 2

## 2013-02-17 MED ORDER — ENOXAPARIN SODIUM 30 MG/0.3ML ~~LOC~~ SOLN
30.0000 mg | SUBCUTANEOUS | Status: DC
  Administered 2013-02-17 – 2013-02-20 (×4): 30 mg via SUBCUTANEOUS
  Filled 2013-02-17 (×6): qty 0.3

## 2013-02-17 MED ORDER — LIDOCAINE HCL (PF) 1 % IJ SOLN: 5.0000 mL | INTRAMUSCULAR | Status: DC | PRN

## 2013-02-17 MED ORDER — NEPRO/CARBSTEADY PO LIQD
237.0000 mL | ORAL | Status: DC | PRN
  Filled 2013-02-17: qty 237

## 2013-02-17 MED ORDER — SODIUM CHLORIDE 0.9 % IV SOLN: 100.0000 mL | INTRAVENOUS | Status: DC | PRN

## 2013-02-17 MED ORDER — LIDOCAINE-PRILOCAINE 2.5-2.5 % EX CREA
1.0000 "application " | TOPICAL_CREAM | CUTANEOUS | Status: DC | PRN
  Filled 2013-02-17: qty 5

## 2013-02-17 MED ORDER — IBUPROFEN 200 MG PO TABS
200.0000 mg | ORAL_TABLET | ORAL | Status: DC | PRN
  Administered 2013-02-17 – 2013-02-20 (×4): 200 mg via ORAL
  Filled 2013-02-17 (×5): qty 1

## 2013-02-17 MED ORDER — PENTAFLUOROPROP-TETRAFLUOROETH EX AERO: 1.0000 "application " | INHALATION_SPRAY | CUTANEOUS | Status: DC | PRN

## 2013-02-17 NOTE — Procedures (Signed)
I was present at this dialysis session. I have reviewed the session itself and made appropriate changes.   Vinson Moselle  MD Pager [phone]    Cell  [phone] 02/17/2013, 3:50 PM

## 2013-02-17 NOTE — Progress Notes (Signed)
Patient ID: Nicholas [name] MRN: [id number], DOB/AGE: 1941-04-28   Admit date: 02/16/2013  Primary Physician: Daniel Nones Primary Cardiologist: Katherina Right, MD   Pt. Profile:  72 y/o male with h/o CAD s/p CABG last month along with R CEA, who was just discharged from the surgical service yesterday and presented back to the ED this AM 2/2 worsening dyspnea.  Problem List  Past Medical History  Diagnosis Date  . Coronary artery disease     a. hx of MI 1992; b. 10/2012 NSTEMI (no cath 2/2 GIB);  c. 11/2012 MV: Inf defect w inf and septal HK (favor prior MI), EF 47%;  d. 12/2012 Cath: Severe 3vd, EF 45-50%;  e. 01/2013 CABG x 5 (LIMA->LAD, VG->D1, VG->OM1->OM2, VG->PDA)  . Diabetes mellitus   . Hypertension   . Arthritis   . Carotid artery disease     a. 01/2013 doppler: >80% RICA stenosis, <39% LICA stenosis;  b. 01/2013 R CEA.  . Gastritis 2012 and 09/2011    treated for H Pylori in 12/2011  . Diabetic neuropathy   . Osteoporosis   . COPD (chronic obstructive pulmonary disease)   . Anxiety   . Depression   . ESRD (end stage renal disease)     M-W-Sa  [name]  . Diabetic nephropathy   . GERD (gastroesophageal reflux disease)   . Neuromuscular disorder     peripheral neurotherapy  . Anemia   . A-fib     a. Dx 10/2012, on amio, no anticoagulation 2/2 GIB 10/2012.  [name] GIB (gastrointestinal bleeding)     a. 10/2012, awaiting colonoscopy.    Past Surgical History  Procedure Laterality Date  . Coronary stent placement    . Hip surgery      both hips  . Av fistula placement      Left arm  . Cardiac catheterization    . Joint replacement Bilateral   . Eye surgery Bilateral     cararacts  . Coronary artery bypass graft N/A 02/03/2013    Procedure: CORONARY ARTERY BYPASS GRAFTING (CABG);  Surgeon: Loreli Slot, MD;  Location: [facility] OR;  Service: Open Heart Surgery;  Laterality: N/A;  . Endarterectomy N/A 02/03/2013    Procedure: ENDARTERECTOMY CAROTID;  Surgeon: Pryor Ochoa, MD;  Location: [facility] OR;  Service: Vascular;  Laterality: N/A;    Allergies  Allergies  Allergen Reactions  . Statins Other (See Comments)    Muscle weakness    HPI  72 y/o male with the above complex problem list.  He has a h/o CAD dating back to 1992, though generally did well over the years. He is a dialysis patient and is on Monday Wednesday Friday dialysis locally. Earlier this year, he underwent stress testing in preparation for a right carotid endarterectomy and this was normal. Unfortunately, he presented in March of 2014 to [facility] with respiratory failure, A. fib RVR, GI bleeding, and non-ST segment elevation myocardial infarction. Atrial fibrillation was managed with amiodarone but he was not placed on anticoagulation secondary to GI bleeding. GI recommended colonoscopy however also recommended cardiac stabilization first. He followup in our clinic as an outpatient and subsequently underwent repeat stress testing which now showed an inferior and septal defect with an EF of 47%. With this, decision was made to pursue diagnostic catheterization which took place in May and revealed multivessel coronary artery disease. Patient was evaluated by cardiothoracic surgery and subsequently underwent successful coronary artery bypass grafting x5 on June 19. He also  underwent a right carotid endarterectomy. Hemodialysis was continued in the postoperative setting. Postoperative course was complicated by deconditioning as well as hypoxia in the setting of left hemidiaphragm. Patient was seen by physical and occupational therapy and felt to require short-term rehabilitation. He was felt to be stable for discharge to rehabilitation yesterday.  Patient reports that since his surgery he has been dyspneic with any activity.  The morning of admmissin, he   ambulated and dropped his oxygen saturations into the 80s. Decision was made to transfer him back to the ED for evaluation. He has not had any anginal  pain but does have some incisional pain. He is dyspneic with talking. He has had orthopnea but denies PND. His appetite has been normal and he has not noted any increase in abdominal girth or lower extremity edema.  He has been dialyzed  3.3 liters yesterday. He has a  left pleural effusion and a small - moderate right pleural effusion.  He is getting a Ct of the chest and decubitus films today.   Home Medications  Prior to Admission medications   Medication Sig Start Date End Date Taking? Authorizing Provider  acetaminophen (TYLENOL) 500 MG tablet Take 500 mg by mouth every 4 (four) hours as needed for pain.   Yes Historical Provider, MD  allopurinol (ZYLOPRIM) 100 MG tablet Take 100 mg by mouth 2 (two) times daily.    Yes Historical Provider, MD  amiodarone (PACERONE) 200 MG tablet Take 200 mg by mouth daily.   Yes Historical Provider, MD  fish oil-omega-3 fatty acids 1000 MG capsule Take 1 g by mouth 2 (two) times daily.    Yes Historical Provider, MD  gabapentin (NEURONTIN) 300 MG capsule Take 1 capsule (300 mg total) by mouth 2 (two) times daily. 02/15/13  Yes Donielle Margaretann Loveless, PA-C  insulin aspart (NOVOLOG) 100 UNIT/ML injection Inject 0-8 Units into the skin 3 (three) times daily before meals. 0-99= no insulin; 100-150= 2 units; 151-200=4 units; 201-250=6 units; greater than 250= 8 units.   Yes Historical Provider, MD  insulin glargine (LANTUS) 100 UNIT/ML injection Inject 15 Units into the skin at bedtime. 10/27/12  Yes Vassie Loll, MD  metoprolol tartrate (LOPRESSOR) 25 MG tablet Take 12.5 mg by mouth 2 (two) times daily.   Yes Historical Provider, MD  Multiple Vitamins-Minerals (MULTIVITAMIN WITH MINERALS) tablet Take 1 tablet by mouth at bedtime.    Yes Historical Provider, MD  multivitamin (RENA-VIT) TABS tablet Take 1 tablet by mouth at bedtime.    Yes Historical Provider, MD  pantoprazole (PROTONIX) 40 MG tablet Take 40 mg by mouth daily.   Yes Historical Provider, MD    tamsulosin (FLOMAX) 0.4 MG CAPS Take 0.4 mg by mouth at bedtime.   Yes Historical Provider, MD  traMADol (ULTRAM) 50 MG tablet Take 50 mg by mouth every 8 (eight) hours as needed for pain.   Yes Historical Provider, MD  aspirin EC 325 MG EC tablet Take 1 tablet (325 mg total) by mouth daily. 02/15/13   Donielle Margaretann Loveless, PA-C  darbepoetin (ARANESP) 100 MCG/0.5ML SOLN Inject 0.5 mLs (100 mcg total) into the vein every Friday with hemodialysis. 02/15/13   Donielle Margaretann Loveless, PA-C  lidocaine-prilocaine (EMLA) cream Apply 1 application topically every Monday, Wednesday, and Friday.  11/03/12   Historical Provider, MD  sevelamer carbonate (RENVELA) 800 MG tablet Take 800 mg by mouth 3 (three) times daily with meals.    Historical Provider, MD   Family History  Family History  Problem Relation Age of Onset  . Heart attack Father 74  . Heart disease Father   . Stroke Mother   . Hypertension Mother   . Other Mother     varicose veins  . Stroke Maternal Grandmother   . Hypertension Brother    Social History  History   Social History  . Marital Status: Married    Spouse Name: N/A    Number of Children: N/A  . Years of Education: N/A   Occupational History  . other     retired Naval architect.   Social History Main Topics  . Smoking status: Former Smoker -- 1.00 packs/day for 25 years    Types: Cigarettes    Start date: 08/18/1965    Quit date: 08/18/1990  . Smokeless tobacco: Not on file  . Alcohol Use: Yes     Comment: occasionally  . Drug Use: No  . Sexually Active: Not on file   Other Topics Concern  . Not on file   Social History Narrative   Was living in [place] by himself prior to CABG and hopes to return there after rehab @ [facility].     Physical Exam  Blood pressure 99/63, pulse 87, temperature 98.4 F (36.9 C), temperature source Oral, resp. rate 18, height 5\' 6"  (1.676 m), weight 174 lb (78.926 kg), SpO2 91.00%.  General: Pleasant, NAD Psych: Normal  affect. Neuro: Alert and oriented X 3. Moves all extremities spontaneously. HEENT: Normal  Neck: Supple without bruits or JVD.  Well healing Right CEA scar. Lungs:  Resp labored @ rest.  Absent breath sounds in left base with diminished breath sounds the rest of the way up.  Diminished breath sounds in right base, otw clear. Heart: RRR no s3, s4, or murmurs. Abdomen: Soft, non-tender, non-distended, BS + x 4.  Extremities: No clubbing, cyanosis.  Trace bilat LE edema. DP/PT/Radials 1+ and equal bilaterally.  Labs  Trop i, poc: 0.10  Lab Results  Component Value Date   WBC 15.6* 02/17/2013   HGB 8.9* 02/17/2013   HCT 28.5* 02/17/2013   MCV 97.3 02/17/2013   PLT 407* 02/17/2013     Recent Labs Lab 02/16/13 1054 02/17/13 0100  NA 131* 137  K 4.8 4.0  CL 91* 97  CO2 28 29  BUN 32* 14  CREATININE 5.56* 2.97*  CALCIUM 8.7 8.7  PROT 6.4  --   BILITOT 0.5  --   ALKPHOS 177*  --   ALT 14  --   AST 19  --   GLUCOSE 106* 98   Radiology/Studies  Dg Chest 2 View  02/16/2013   *RADIOLOGY REPORT*  Clinical Data: Chest pain and shortness of breath.  Recent open- heart surgery.  CHEST - 2 VIEW  Comparison: 02/08/2013.  Findings: Very poor inspiration.  Increased bilateral pleural fluid.  No gross change in enlargement of the cardiac silhouette and stable post CABG changes.  Extensive bilateral shoulder degenerative changes and elevation of the left hemidiaphragm are again demonstrated.  IMPRESSION:  1.  Increased bilateral pleural fluid. 2.  Stable cardiomegaly.   Original Report Authenticated By: Beckie Salts, M.D.   ECG  Sinus rhythm, 75, no acute st/t changes.  ASSESSMENT AND PLAN  1.  CAD:  S/p CABG x 5 on 6/19.  He has had some incisional pain but otw no chest pain/angina.  He has been dyspneic since surgery and he does not believe that this has acutely changed.  He was sent to the ED 2/2  dyspnea and desaturation noted @ [facility].  CXR shows significant effusions with left hemidiaphragm,  which are likely the major contributor to his Ss.  He does have mild edema on xray as well and is pending dialysis today.  Cont previously Rx meds  - asa/bb.  He is intolerant to statins.  2.  Left Hemidiaphragm/Pleural Effusions:  As above.  Recommend CT surgery eval.  If Ss and cxr changes persist after dialysis, he may require thoracentesis.  3.  ESRD:  Due for dialysis today.   4.  Afib:  In sinus and on amio.   Cont asa.  Long term anticoagulation has been deferred as he has a h/o GIB in March with rec for colonoscopy, which is pending.  5.  Leukocytosis:  Follow.  Afebrile.  6.  DM:  Per IM.  Signed, Elyn Aquas., NP 02/17/2013, 8:30 AM As above, patient seen and examined. Briefly he is a 72 year old male with past medical history of coronary artery disease status post recent coronary artery bypass and graft, end-stage renal disease/dialysis dependent, paroxysmal atrial fibrillation, diabetes mellitus, hypertension, hyperlipidemia for evaluation of dyspnea. Patient underwent coronary artery bypassing graft on June 19. Since his surgery he complains of dyspnea. He was discharged yesterday and has continued to have dyspnea with low saturations and he was referred back to the emergency room today. He denies fevers, chills or productive cough. He has some residual chest soreness but no other chest pain. There is orthopnea. He denies pedal edema. Chest x-ray shows bilateral pleural effusions increased in size. Would ask nephrology to evaluate as he is due for dialysis today. Note he makes no urine. I would ask CVTS to see as he was just discharged yesterday. He may require thoracentesis if his pleural effusions do not improve with volume removal with dialysis. Continue aspirin and beta blocker. He is intolerant to statins. Continue amiodarone for history of atrial fibrillation. Not on anticoagulation because of recent GI bleed. Elyn Aquas. 8:30 AM

## 2013-02-17 NOTE — Progress Notes (Signed)
Subjective:  UF 3.3 kg w HD yest, some BP drops.  Still SOB today, minimal coughing, hurts when he cough.  +orthopnea, worse on R than L  Objective Filed Vitals:   02/16/13 2210 02/17/13 0136 02/17/13 0607 02/17/13 0900  BP: 136/69 107/58 99/63 108/59  Pulse: 99 89 87 84  Temp: 97.6 F (36.4 C) 98 F (36.7 C) 98.4 F (36.9 C)   TempSrc: Oral Axillary Oral   Resp: 16 17 18    Height:      Weight:   78.926 kg (174 lb)   SpO2: 93% 97% 91%     CBC:  Recent Labs Lab 02/15/13 0445 02/16/13 1054 02/17/13 0100  WBC 11.5* 18.2* 15.6*  NEUTROABS  --  15.9* 13.3*  HGB 7.8* 8.8* 8.9*  HCT 25.2* 28.6* 28.5*  MCV 98.8 99.3 97.3  PLT 343 359 407*   Basic Metabolic Panel / Renal Panel:  Recent Labs Lab 02/11/13 0820 02/14/13 1205 02/15/13 0445 02/16/13 1054 02/17/13 0100  NA 136 133* 134* 131* 137  K 4.6 4.4 4.3 4.8 4.0  CL 98 93* 96 91* 97  CO2 28 26 30 28 29   GLUCOSE 115* 112* 111* 106* 98  BUN 18 35* 24* 32* 14  CREATININE 2.49* 4.06* 3.65* 5.56* 2.97*  CALCIUM 9.0 8.6 8.5 8.7 8.7  PHOS 2.3 3.3  --   --   --     Physical Exam:  Blood pressure 108/59, pulse 84, temperature 98.4 F (36.9 C), temperature source Oral, resp. rate 18, height 5\' 6"  (1.676 m), weight 78.926 kg (174 lb), SpO2 91.00%. Gen: alert, mild tachypnea, some audible crackles w respirations Heart: RRR, 1/6 sem lsb, no rub Lungs: L base dec'd 1/2 up, some rales at R base Abdomen: Bs pos. ,obese, soft, nontender Ext- trace LE edema +Bruit LUA AVF   Outpatient HD (MWF [facility])  4hrs    EDW 80.5kg   2.25Ca     LFA AVF  Hect 2ug   EPO 6800    Venofer 50mg /wk   Assess/Plan  1 SOB due to pulm edema/vol excess and pleural effusions, +/- other > HD again today 2 CKD, usual HD MWF 3 Anemia of CKD, on aranesp and weekly IV Fe 4 Sec HPT, cont vit D and binders 5 DM- on insulin 6 Gout, on allopurinol 7 Afib, on amio, ASA; no AC due to GIB in Mar   Rob Marico Buckle  MD Pager [phone]    Cell  [phone] 02/17/2013, 10:54 AM

## 2013-02-17 NOTE — Progress Notes (Signed)
Subjective: Nicholas [name] is feeling better this morning, states he is breathing better.  He is still having some musculoskeletal pain ion his right side along the midaxillary line.  He has also been coughing some but no sputum production, no fevers.   Objective: Vital signs in last 24 hours: Filed Vitals:   02/16/13 2210 02/17/13 0136 02/17/13 0607 02/17/13 0900  BP: 136/69 107/58 99/63 108/59  Pulse: 99 89 87 84  Temp: 97.6 F (36.4 C) 98 F (36.7 C) 98.4 F (36.9 C)   TempSrc: Oral Axillary Oral   Resp: 16 17 18    Height:      Weight:   174 lb (78.926 kg)   SpO2: 93% 97% 91%    Weight change:   Intake/Output Summary (Last 24 hours) at 02/17/13 1159 Last data filed at 02/17/13 0930  Gross per 24 hour  Intake    363 ml  Output   3353 ml  Net  -2990 ml   PEX General: alert, cooperative, and in no apparent distress HEENT: vision grossly intact, oropharynx clear and non-erythematous  Neck: well healing scar on right neck; supple, no lymphadenopathy, JVD, or carotid bruits Chest: healing midline sternotomy scar Lungs: less labored breathing; rales in bilateral middle and lower lung fields; no wheezes,ronchi Heart: distant heart sounds, regular rate and rhythm, no murmurs, gallops, or rubs Abdomen: soft, non-tender, non-distended, normal bowel sounds Extremities: trace non-pitting edema, scars on bilateral medial lower legs, scattered bruises; olecranon bursitis on right elbow; 2+ DP/PT pulses bilaterally, no cyanosis, clubbing, or edema Neurologic: alert & oriented X3, cranial nerves II-XII intact, strength grossly intact, sensation intact to light touch   Lab Results: Basic Metabolic Panel:  Recent Labs Lab 02/11/13 0820 02/14/13 1205  02/16/13 1054 02/17/13 0100  NA 136 133*  < > 131* 137  K 4.6 4.4  < > 4.8 4.0  CL 98 93*  < > 91* 97  CO2 28 26  < > 28 29  GLUCOSE 115* 112*  < > 106* 98  BUN 18 35*  < > 32* 14  CREATININE 2.49* 4.06*  < > 5.56* 2.97*  CALCIUM  9.0 8.6  < > 8.7 8.7  PHOS 2.3 3.3  --   --   --   < > = values in this interval not displayed. Liver Function Tests:  Recent Labs Lab 02/14/13 1205 02/16/13 1054  AST  --  19  ALT  --  14  ALKPHOS  --  177*  BILITOT  --  0.5  PROT  --  6.4  ALBUMIN 2.7* 2.6*   CBC:  Recent Labs Lab 02/16/13 1054 02/17/13 0100  WBC 18.2* 15.6*  NEUTROABS 15.9* 13.3*  HGB 8.8* 8.9*  HCT 28.6* 28.5*  MCV 99.3 97.3  PLT 359 407*   Cardiac Enzymes:  Recent Labs Lab 02/16/13 1900 02/17/13 0100  TROPONINI <0.30 <0.30   BNP: No results found for this basename: PROBNP,  in the last 168 hours D-Dimer: No results found for this basename: DDIMER,  in the last 168 hours CBG:  Recent Labs Lab 02/15/13 0559 02/15/13 1111 02/16/13 2059 02/17/13 0550 02/17/13 0633 02/17/13 1107  GLUCAP 110* 145* 91 88 91 266*   Hemoglobin A1C: No results found for this basename: HGBA1C,  in the last 168 hours Fasting Lipid Panel: No results found for this basename: CHOL, HDL, LDLCALC, TRIG, CHOLHDL, LDLDIRECT,  in the last 168 hours Thyroid Function Tests: No results found for this basename: TSH, T4TOTAL, FREET4,  T3FREE, THYROIDAB,  in the last 168 hours Coagulation:  Recent Labs Lab 02/16/13 1054  LABPROT 15.4*  INR 1.25   Anemia Panel: No results found for this basename: VITAMINB12, FOLATE, FERRITIN, TIBC, IRON, RETICCTPCT,  in the last 168 hours Urine Drug Screen: Drugs of Abuse  No results found for this basename: labopia, cocainscrnur, labbenz, amphetmu, thcu, labbarb    Studies/Results: Dg Chest 2 View  02/17/2013   *RADIOLOGY REPORT*  Clinical Data:  Shortness of breath post open heart surgery, weakness and soreness  CHEST - 2 VIEW  Comparison: 02/16/2013  Findings: Enlargement of cardiac silhouette post CABG. Bilateral pleural effusions left greater than right increased on left since previous exam. Bibasilar atelectasis greater on the left. Probable mild perihilar edema.  Atherosclerotic calcification aorta. No pneumothorax. Bones demineralized  IMPRESSION: Bibasilar pleural effusions and atelectasis greater on left, increased on left since previous study. Enlargement of cardiac silhouette with pulmonary vascular congestion and suspect mild perihilar pulmonary edema.   Original Report Authenticated By: Ulyses Southward, M.D.   Dg Chest 2 View  02/16/2013   *RADIOLOGY REPORT*  Clinical Data: Chest pain and shortness of breath.  Recent open- heart surgery.  CHEST - 2 VIEW  Comparison: 02/08/2013.  Findings: Very poor inspiration.  Increased bilateral pleural fluid.  No gross change in enlargement of the cardiac silhouette and stable post CABG changes.  Extensive bilateral shoulder degenerative changes and elevation of the left hemidiaphragm are again demonstrated.  IMPRESSION:  1.  Increased bilateral pleural fluid. 2.  Stable cardiomegaly.   Original Report Authenticated By: Beckie Salts, M.D.   Dg Chest Bilateral Decubitus  02/17/2013   *RADIOLOGY REPORT*  Clinical Data: Bilateral pleural effusions, larger on the left  CHEST - BILATERAL DECUBITUS VIEW  Comparison: Chest radiograph, 03/16/2013  Findings: Moderate left pleural effusion layers dependently on the left lateral decubitus view.  No significant layering fluid is seen on the right lateral decubitus view.  IMPRESSION: Moderate free flowing left pleural effusion.  No significant layering pleural fluid on the right.   Original Report Authenticated By: Amie Portland, M.D.   Medications: I have reviewed the patient's current medications. Scheduled Meds: . allopurinol  100 mg Oral BID  . amiodarone  200 mg Oral Daily  . aspirin EC  325 mg Oral Daily  . [START ON 02/18/2013] darbepoetin  100 mcg Intravenous Q Fri-HD  . doxercalciferol  2 mcg Oral 3 times weekly  . enoxaparin (LOVENOX) injection  30 mg Subcutaneous Q24H  . ferric gluconate (FERRLECIT/NULECIT) IV  62.5 mg Intravenous Q Wed-HD  . gabapentin  300 mg Oral BID  .  insulin aspart  0-9 Units Subcutaneous TID WC  . insulin glargine  15 Units Subcutaneous QHS  . multivitamin  1 tablet Oral QHS  . pantoprazole  40 mg Oral Daily  . sevelamer carbonate  800 mg Oral TID WC  . sodium chloride  3 mL Intravenous Q12H  . tamsulosin  0.4 mg Oral QHS   Continuous Infusions:  PRN Meds:.acetaminophen, ibuprofen, ondansetron (ZOFRAN) IV, ondansetron, traMADol Assessment/Plan: Nicholas [name] is a 72 y/o man with PMH of ESRD on HD (since 3/14, MWF), multivessel CAD (MI in 1992, NSTEMI in 3/14; echo in 4/14 showed EF of 50%) and right carotid stenosis s/p CABGx5/RCEA on 02/03/13, atrial fibrillation with RVR in 3/14 (not on anticoagulation), GI bleed in 3/14, COPD, DM2, HTN who presents to the ED from rehab with likely fluid overload; due for dialysis today and likely deconditioned from his recent surgical  hospitalization.   1. Dyspnea 2/2 volume overload- Pt more dyspneic than baseline in context of likely deconditioning due to recent surgical hospitalization.  Pt went to dialysis yesterday evening and has clinical improvement today as well as improved aeration of left lung on 7/3 CXR.  In addition, pt is net out ~3.5L in last 24 hours.  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  CT surgery is aware for consideration of thoracentesis if unable to remove adequate fluid through dialysis; however, must consider paralysis of left hemidiaphragm.   -hold metoprolol given lower blood pressures since yesterday's dialysis -daily dialysis to aid in fluid removal, nephrology following  -repeat CXR tomorrow am -continue 2L O2, keep O2 sat above 92% -continue telemetry -incentive spirometry -follow-up blood cultures -PT/OT recommend pt returning to SNF ([facility])  2. Right chest wall pain, sternotomy incisional pain- ruled out ACS with troponins x3 negative given pt's history of MIs   3. ESRD- pt has been on HD since 3/14, does not produce any  urine -getting daily dialysis for now given volume overload -nephrology on board  4. Resolving leukocytosis- WBC count down from 18 yesterday to 15 today therefore likely a stress response, still no evidence of infection.  -follow-up blood cultures  5. COPD- will continue to closely monitor pt's respiratory status though pt does not seem to have a COPD exacerbation at this time given lack of cough/sputum production  -continue home inhalers  6. CAD- recent CABGx5 on 02/03/13 -holding metoprolol for now (per #1 above) -continue ASA  7. History of A fib with history of GI bleed- both discovered in 3/14; no anticoagulation for A fib given history of GI bleed but on amiodarone -continue amiodarone -continue pantoprazole  8. DM2 -continue Lantus, SSI -continue gabpentin for neuropathic pain  9. HTN -holding metoprolol for now  10. DVT PPX- SCDs   Dispo: Disposition is deferred at this time, awaiting improvement of current medical problems.  Anticipated discharge in approximately 2-3 day(s).   The patient does have a current PCP Daniel Nones) and does need an [facility] hospital follow-up appointment after discharge.   .Services Needed at time of discharge: Y = Yes, Blank = No PT:   OT:   RN:   Equipment:   Other:     LOS: 1 day   Rocco Serene, MD 02/17/2013, 11:59 AM

## 2013-02-17 NOTE — Progress Notes (Signed)
Pt up to chair with assist of PT today, 2 person assist, patient tolerated it well. Pt alert and oriented times 4, hard of hearing, but able to communicate needs. Pt transported to dialysis today by bed, remains alert and oriented.

## 2013-02-17 NOTE — Progress Notes (Signed)
Pt CBG 91 this PM. Pt is refusing snack this PM. MD notified and ordered to hold lantus this PM. Baron Hamper, RN 02/17/2013

## 2013-02-17 NOTE — Progress Notes (Signed)
Utilization Review Completed.   Golden Gilreath, RN, BSN Nurse Case Manager  [phone]  

## 2013-02-17 NOTE — Plan of Care (Signed)
Problem: ICU Phase Progression Outcomes Goal: Voiding-avoid urinary catheter unless indicated Outcome: Not Applicable Date Met:  02/17/13 Pt anuric.

## 2013-02-17 NOTE — Progress Notes (Signed)
Utilization Review Completed.   Braydn Carneiro, RN, BSN Nurse Case Manager  [phone]  

## 2013-02-17 NOTE — H&P (Signed)
I saw and evaluated the patient. I reviewed the resident's note and confirmed the resident's findings.  I agree with the assessment and plan as documented in the resident's note.  Briefly, Nicholas [name] is a 72 year old man who is status post a coronary artery bypass graft x5 and right carotid endarterectomy on June 19. He was recently discharged from the hospital but noted increasing dyspnea on exertion, orthopnea, and nonpleuritic right-sided chest pain. He denies any fevers, shakes, chills, abdominal pain, cough, sputum production, wheezing, or palpitations. He returned to the emergency department and was found to have volume overload on chest x-ray with bilateral effusions left greater than right and an elevated left hemidiaphragm which was not present preoperatively. His symptoms were considered to be secondary to volume overload and inadequate dialysis. He was admitted to the internal medicine teaching service and underwent dialysis last night. He had slight improvement in his dyspnea after dialysis and removal of 3.3 kg. He is otherwise without complaints at this time.  Physical exam reveals decreased breath sounds and dullness on the left chest to auscultation. The right lung field is notable for basilar inspiratory rales and crackles. There is no chest wall tenderness on the right side where he is experiencing his nonpleuritic chest pain.  Our assessment is that Nicholas [name] is volume overloaded and requires more frequent dialysis for the next several days. Blood pressure may be a limiting factor in achieving removal of fluid so we will hold the beta blocker given he is in sinus rhythm, in hopes of expediting volume removal through hemodialysis. We will monitor his symptoms as volume is removed. If his dyspnea and orthopnea improve, the likely etiology for his symptoms was the volume overload. If they do not improve, other etiologies will be looked into. The most likely one of these is left  hemidiaphragmatic paralysis and this will be assessed with a sniff test. Although thoracentesis may improve his dyspnea acutely, it is hoped this invasive procedure will not be necessary as we attempt to remove volume via hemodialysis. A COPD exacerbation is unlikely given the lack of increased cough or sputum production. Although his modified Geneva score was 6 and is intermediate, a more likely explanation for his dyspnea is the obvious volume overload. If his symptoms do not improve as volume is removed we will reconsider the diagnosis of pulmonary embolism but this is considered to be very low on our differential at this time with his current findings.

## 2013-02-17 NOTE — Evaluation (Signed)
Physical Therapy Evaluation Patient Details Name: Nicholas [name] MRN: [id number] DOB: 1941/02/25 Today's Date: 02/17/2013 Time: 1152-1222 PT Time Calculation (min): 30 min  PT Assessment / Plan / Recommendation History of Present Illness  [name] is a 72 y/o man with PMH of ESRD on HD (since 3/14, MWF), multivessel CAD (MI in 1992, NSTEMI in 3/14; echo in 4/14 showed EF of 50%) and right carotid stenosis s/p CABGx5/RCEA on 02/03/13, atrial fibrillation with RVR in 3/14 (not on anticoagulation), GI bleed in 3/14, COPD, DM2, HTN who presents to the ED from rehab with shortness of breath and chest pain.  Clinical Impression  Pt with generalized weakness and SOB noted with decrease SaO2 with any mobility.  Pt will benefit from acute PT services to improve overall mobility and prepare for safe d/c to next venue.      PT Assessment  Patient needs continued PT services    Follow Up Recommendations  SNF (return to [facility])    Barriers to Discharge Decreased caregiver support;Other (comment)      Equipment Recommendations  None recommended by PT    Recommendations for Other Services     Frequency Min 3X/week    Precautions / Restrictions Precautions Precautions: Sternal Restrictions Weight Bearing Restrictions: No   Pertinent Vitals/Pain >90 % SaO2 on 3L; decreased to 86-88% on 3L-4L with mobility; Educated pt on pursed lip breathing.  Pt continues to mouth breath.      Mobility  Bed Mobility Bed Mobility: Sit to Supine Supine to Sit: 3: Mod assist;HOB elevated Details for Bed Mobility Assistance: (A) to elevate trunk OOB with cues for technique and cues to maintain sternal precautions Transfers Transfers: Sit to Stand;Stand to Sit Sit to Stand: 1: +2 Total assist;From bed Sit to Stand: Patient Percentage: 70% Stand to Sit: 1: +2 Total assist;To chair/3-in-1 Stand to Sit: Patient Percentage: 70% Stand Pivot Transfers: 1: +2 Total assist;From elevated  surface Stand Pivot Transfers: Patient Percentage: 70% Details for Transfer Assistance: (A) to initiate transfer and slowly descend to recliner with cues for technique Ambulation/Gait Ambulation/Gait Assistance: Not tested (comment)    Exercises     PT Diagnosis: Difficulty walking;Abnormality of gait;Generalized weakness  PT Problem List: Decreased strength;Decreased activity tolerance;Decreased balance;Decreased mobility;Decreased knowledge of precautions;Cardiopulmonary status limiting activity PT Treatment Interventions: DME instruction;Gait training;Stair training;Functional mobility training;Therapeutic activities;Therapeutic exercise;Balance training;Neuromuscular re-education;Patient/family education     PT Goals(Current goals can be found in the care plan section) Acute Rehab PT Goals Patient Stated Goal: to go home after some form of rehab PT Goal Formulation: With patient Time For Goal Achievement: 03/03/13 Potential to Achieve Goals: Good  Visit Information  Last PT Received On: 02/17/13 Assistance Needed: +2 History of Present Illness: [name] is a 72 y/o man with PMH of ESRD on HD (since 3/14, MWF), multivessel CAD (MI in 1992, NSTEMI in 3/14; echo in 4/14 showed EF of 50%) and right carotid stenosis s/p CABGx5/RCEA on 02/03/13, atrial fibrillation with RVR in 3/14 (not on anticoagulation), GI bleed in 3/14, COPD, DM2, HTN who presents to the ED from rehab with shortness of breath and chest pain.       Prior Functioning  Home Living Family/patient expects to be discharged to:: Skilled nursing facility Living Arrangements: Other relatives Available Help at Discharge: Family;Available PRN/intermittently Type of Home: House Home Access: Stairs to enter [employer] of Steps: 3 Entrance Stairs-Rails: Right;Left Home Layout: One level Home Equipment: Walker - 4 wheels;Cane - single point;Bedside commode Additional Comments: used  the cane in the community  and walker in the house.    Lives With: Alone Prior Function Level of Independence: Independent with assistive device(s) Communication Communication: No difficulties Dominant Hand: Right    Cognition  Cognition Arousal/Alertness: Awake/alert Behavior During Therapy: WFL for tasks assessed/performed    Extremity/Trunk Assessment Lower Extremity Assessment Lower Extremity Assessment: Generalized weakness   Balance Balance Balance Assessed: Yes Static Sitting Balance Static Sitting - Balance Support: Feet supported Static Sitting - Level of Assistance: 5: Stand by assistance  End of Session PT - End of Session Equipment Utilized During Treatment: Gait belt;Oxygen (3L to 4L) Activity Tolerance: Patient limited by fatigue Patient left: in chair;with call bell/phone within reach Nurse Communication: Mobility status  GP     Nahdia Doucet 02/17/2013, 1:13 PM Jake Shark, PT DPT [phone]

## 2013-02-17 NOTE — Progress Notes (Addendum)
      [address]       [name] [address]             [phone]             Subjective: He has complaints of pain after coughing along his right ribs.  Objective: Vital signs in last 24 hours: Temp:  [97.6 F (36.4 C)-98.4 F (36.9 C)] 98.4 F (36.9 C) (07/03 0607) Pulse Rate:  [74-99] 87 (07/03 0607) Cardiac Rhythm:  [-] Normal sinus rhythm (07/02 2033) Resp:  [12-35] 18 (07/03 0607) BP: (91-137)/(51-71) 99/63 mmHg (07/03 0607) SpO2:  [85 %-100 %] 91 % (07/03 0607) FiO2 (%):  [3 %] 3 % (07/03 0607) Weight:  [78.926 kg (174 lb)-83.1 kg (183 lb 3.2 oz)] 78.926 kg (174 lb) (07/03 0607)   Current Weight  02/17/13 78.926 kg (174 lb)   07/02 0701 - 07/03 0700 In: -  Out: [id number]    Physical Exam:  Cardiovascular: RRR Pulmonary: Diminished at bases bilaterally; no rales, wheezes, or rhonchi. Abdomen: Soft, non tender, mildly distended hypo active bowel sounds noted. Extremities: Bilateral lower extremity edema. Hematoma of right lower extremity Wounds: Clean and dry.  No erythema or signs of infection. Skin edge necrosis of proximal sternal wound.   Lab Results: CBC:  Recent Labs  02/16/13 1054 02/17/13 0100  WBC 18.2* 15.6*  HGB 8.8* 8.9*  HCT 28.6* 28.5*  PLT 359 407*   BMET:   Recent Labs  02/16/13 1054 02/17/13 0100  NA 131* 137  K 4.8 4.0  CL 91* 97  CO2 28 29  GLUCOSE 106* 98  BUN 32* 14  CREATININE 5.56* 2.97*  CALCIUM 8.7 8.7    PT/INR:  Lab Results  Component Value Date   INR 1.25 02/16/2013   INR 1.48 02/03/2013   INR 1.04 02/01/2013   ABG:  INR: Will add last result for INR, ABG once components are confirmed Will add last 4 CBG results once components are confirmed  Assessment/Plan:  1. CV - SR. On Amiodarone 200 daily 2.  Pulmonary - On 3 liters via [place].  CXR bilateral pleural effusions L>R, elevation of left hemidiaphragm (seen on previous x rays), pulmonary vascular congestion,and cardiomegaly. Needs Sniff  Test to evaluate elevated left hemidiaphragm and bilateral decubitus films to further evaluate pleural effusion. May need to consider CT scan as well. 3. ESRD-HD days are Monday, Wednesday, and Friday. Per nephrology, will have HD again today 4.  Acute blood loss anemia - H and H 8.9 and 28.5. On Nulecit with HD every Wed 5.Leukocystosis but remains afebrile. WBC decreased from 18.2 to 15.6.No signs of wound infection. Await blood and urine cultures   ZIMMERMAN,DONIELLE MPA-C 02/17/2013,8:44 AM Plan discussed with renal service I have seen and examined Luiz Iron and agree with the above assessment  and plan.  Delight Ovens MD Beeper [phone] Office [phone] 02/17/2013 12:18 PM

## 2013-02-17 NOTE — Evaluation (Signed)
Occupational Therapy Evaluation Patient Details Name: OVID WITMAN MRN: [id number] DOB: 04-Sep-1940 Today's Date: 02/17/2013 Time: 1201-1222 OT Time Calculation (min): 21 min  OT Assessment / Plan / Recommendation History of present illness Mr. Maland is a 72 y/o man with PMH of ESRD on HD (since 3/14, MWF), multivessel CAD (MI in 1992, NSTEMI in 3/14; echo in 4/14 showed EF of 50%) and right carotid stenosis s/p CABGx5/RCEA on 02/03/13, atrial fibrillation with RVR in 3/14 (not on anticoagulation), GI bleed in 3/14, COPD, DM2, HTN who presents to the ED from rehab with shortness of breath and chest pain.   Clinical Impression   Mr. Liberati is really limited by DOE and needs increased time to complete tasks. Will benefit from continued OT here and at SNF.    OT Assessment  Patient needs continued OT Services    Follow Up Recommendations  SNF    Barriers to Discharge Decreased caregiver support    Equipment Recommendations   (TBD at SNF)       Frequency  Min 2X/week    Precautions / Restrictions Precautions Precautions: Sternal;Fall Precaution Comments: h/o bil hip replacements Restrictions Weight Bearing Restrictions: No   Pertinent Vitals/Pain Intermittent pain in right flank    ADL  Eating/Feeding: Independent Where Assessed - Eating/Feeding: Chair Grooming: Min guard Where Assessed - Grooming: Unsupported sitting Upper Body Bathing: Min guard Where Assessed - Upper Body Bathing: Unsupported sitting Lower Body Bathing: Maximal assistance Where Assessed - Lower Body Bathing: Supported sit to stand Upper Body Dressing: Moderate assistance Where Assessed - Upper Body Dressing: Unsupported sitting Lower Body Dressing: +1 Total assistance Where Assessed - Lower Body Dressing: Supported sit to stand Toilet Transfer: +2 Total assistance Toilet Transfer: Patient Percentage: 70% Toilet Transfer Method: Surveyor, minerals:  (Bed>to recliner to his right  2 feet away) Toileting - Clothing Manipulation and Hygiene: +1 Total assistance Where Assessed - Glass blower/designer Manipulation and Hygiene: Standing Equipment Used: Gait belt Transfers/Ambulation Related to ADLs: total A +2(pt =70%) sit to stand and stand to sit    OT Diagnosis: Generalized weakness;Acute pain  OT Problem List: Decreased strength;Decreased activity tolerance;Impaired balance (sitting and/or standing);Pain;Decreased knowledge of use of DME or AE OT Treatment Interventions: Self-care/ADL training;Balance training;Therapeutic activities;DME and/or AE instruction;Patient/family education   OT Goals(Current goals can be found in the care plan section) Acute Rehab OT Goals Patient Stated Goal: To go back to [facility] for therapy OT Goal Formulation: With patient Time For Goal Achievement: 03/03/13 Potential to Achieve Goals: Good  Visit Information  Last OT Received On: 02/17/13 Assistance Needed: +2 History of Present Illness: Mr. Sweet is a 72 y/o man with PMH of ESRD on HD (since 3/14, MWF), multivessel CAD (MI in 1992, NSTEMI in 3/14; echo in 4/14 showed EF of 50%) and right carotid stenosis s/p CABGx5/RCEA on 02/03/13, atrial fibrillation with RVR in 3/14 (not on anticoagulation), GI bleed in 3/14, COPD, DM2, HTN who presents to the ED from rehab with shortness of breath and chest pain.       Prior Functioning     Home Living Family/patient expects to be discharged to:: Skilled nursing facility Living Arrangements: Other relatives Available Help at Discharge: Skilled Nursing Facility Type of Home: House Home Access: Stairs to enter [employer] of Steps: 3 Entrance Stairs-Rails: Right;Left Home Layout: One level Home Equipment: Environmental consultant - 4 wheels;Cane - single point;Bedside commode Additional Comments: used the cane in the community and walker in the house.    Lives  With: Alone Prior Function Level of Independence: Independent with assistive  device(s) Communication Communication: No difficulties Dominant Hand: Right         Vision/Perception Vision - History Patient Visual Report: No change from baseline   Cognition  Cognition Arousal/Alertness: Awake/alert Behavior During Therapy: WFL for tasks assessed/performed Overall Cognitive Status: Within Functional Limits for tasks assessed    Extremity/Trunk Assessment Upper Extremity Assessment Upper Extremity Assessment:  (limited due sternal precautions, grossly 3/5) Lower Extremity Assessment Lower Extremity Assessment: Generalized weakness     Mobility Bed Mobility Bed Mobility: Sit to Supine Supine to Sit: 3: Mod assist;HOB elevated Details for Bed Mobility Assistance: (A) to elevate trunk OOB with cues for technique and cues to maintain sternal precautions Transfers Sit to Stand: 1: +2 Total assist;From bed Sit to Stand: Patient Percentage: 70% Stand to Sit: 1: +2 Total assist;To chair/3-in-1 Stand to Sit: Patient Percentage: 70% Details for Transfer Assistance: (A) to initiate transfer and slowly descend to recliner with cues for technique        Balance Balance Balance Assessed: Yes Static Sitting Balance Static Sitting - Balance Support: Feet supported Static Sitting - Level of Assistance: 5: Stand by assistance   End of Session OT - End of Session Equipment Utilized During Treatment: Gait belt Activity Tolerance: Patient limited by fatigue Patient left: in chair Nurse Communication: Mobility status       Evette Georges [phone] 02/17/2013, 1:40 PM

## 2013-02-18 ENCOUNTER — Inpatient Hospital Stay (HOSPITAL_COMMUNITY): Payer: Medicare Other

## 2013-02-18 DIAGNOSIS — Z951 Presence of aortocoronary bypass graft: Secondary | ICD-10-CM

## 2013-02-18 DIAGNOSIS — I2510 Atherosclerotic heart disease of native coronary artery without angina pectoris: Secondary | ICD-10-CM

## 2013-02-18 DIAGNOSIS — N186 End stage renal disease: Secondary | ICD-10-CM

## 2013-02-18 LAB — GLUCOSE, CAPILLARY
Glucose-Capillary: 116 mg/dL — ABNORMAL HIGH (ref 70–99)
Glucose-Capillary: 151 mg/dL — ABNORMAL HIGH (ref 70–99)

## 2013-02-18 LAB — URINALYSIS W MICROSCOPIC + REFLEX CULTURE
Ketones, ur: 15 mg/dL — AB
Nitrite: NEGATIVE
pH: 5 (ref 5.0–8.0)

## 2013-02-18 MED ORDER — DOXYLAMINE SUCCINATE (SLEEP) 25 MG PO TABS
25.0000 mg | ORAL_TABLET | Freq: Every evening | ORAL | Status: DC | PRN
  Filled 2013-02-18: qty 1

## 2013-02-18 MED ORDER — ALBUMIN HUMAN 25 % IV SOLN
INTRAVENOUS | Status: AC
  Administered 2013-02-18: 25 g
  Filled 2013-02-18: qty 100

## 2013-02-18 MED ORDER — HEPARIN SODIUM (PORCINE) 1000 UNIT/ML DIALYSIS: 1000.0000 [IU] | INTRAMUSCULAR | Status: DC | PRN

## 2013-02-18 MED ORDER — NEPRO/CARBSTEADY PO LIQD: 237.0000 mL | ORAL | Status: DC | PRN

## 2013-02-18 MED ORDER — METOPROLOL TARTRATE 12.5 MG HALF TABLET
12.5000 mg | ORAL_TABLET | Freq: Two times a day (BID) | ORAL | Status: DC
  Administered 2013-02-18 – 2013-02-21 (×6): 12.5 mg via ORAL
  Filled 2013-02-18 (×8): qty 1

## 2013-02-18 MED ORDER — DARBEPOETIN ALFA-POLYSORBATE 100 MCG/0.5ML IJ SOLN
INTRAMUSCULAR | Status: AC
  Filled 2013-02-18: qty 0.5

## 2013-02-18 MED ORDER — LIDOCAINE-PRILOCAINE 2.5-2.5 % EX CREA: 1.0000 "application " | TOPICAL_CREAM | CUTANEOUS | Status: DC | PRN

## 2013-02-18 MED ORDER — ALTEPLASE 2 MG IJ SOLR: 2.0000 mg | Freq: Once | INTRAMUSCULAR | Status: DC | PRN

## 2013-02-18 MED ORDER — PENTAFLUOROPROP-TETRAFLUOROETH EX AERO: 1.0000 "application " | INHALATION_SPRAY | CUTANEOUS | Status: DC | PRN

## 2013-02-18 MED ORDER — LIDOCAINE HCL (PF) 1 % IJ SOLN: 5.0000 mL | INTRAMUSCULAR | Status: DC | PRN

## 2013-02-18 MED ORDER — SODIUM CHLORIDE 0.9 % IV SOLN: 100.0000 mL | INTRAVENOUS | Status: DC | PRN

## 2013-02-18 MED ORDER — DOXERCALCIFEROL 4 MCG/2ML IV SOLN
INTRAVENOUS | Status: AC
  Administered 2013-02-18: 2 ug
  Filled 2013-02-18: qty 2

## 2013-02-18 MED ORDER — DIPHENHYDRAMINE HCL 25 MG PO CAPS
25.0000 mg | ORAL_CAPSULE | Freq: Every evening | ORAL | Status: DC | PRN
  Administered 2013-02-18 – 2013-02-20 (×2): 25 mg via ORAL
  Filled 2013-02-18 (×2): qty 1

## 2013-02-18 NOTE — Progress Notes (Signed)
Subjective: Feels a lot better after HD yest, 2.8 kg removed  Objective Filed Vitals:   02/17/13 1827 02/17/13 1923 02/17/13 2116 02/18/13 0650  BP: 104/61 114/65 117/59 124/71  Pulse: 92 94 107 90  Temp: 97.1 F (36.2 C) 97.6 F (36.4 C) 98.3 F (36.8 C) 97.4 F (36.3 C)  TempSrc: Oral Oral Oral Oral  Resp: 12 15 16 18   Height:      Weight: 79.1 kg (174 lb 6.1 oz)   76.7 kg (169 lb 1.5 oz)  SpO2: 96% 93% 92% 95%    CBC:  Recent Labs Lab 02/15/13 0445 02/16/13 1054 02/17/13 0100  WBC 11.5* 18.2* 15.6*  NEUTROABS  --  15.9* 13.3*  HGB 7.8* 8.8* 8.9*  HCT 25.2* 28.6* 28.5*  MCV 98.8 99.3 97.3  PLT 343 359 407*   Basic Metabolic Panel / Renal Panel:  Recent Labs Lab 02/14/13 1205 02/15/13 0445 02/16/13 1054 02/17/13 0100  NA 133* 134* 131* 137  K 4.4 4.3 4.8 4.0  CL 93* 96 91* 97  CO2 26 30 28 29   GLUCOSE 112* 111* 106* 98  BUN 35* 24* 32* 14  CREATININE 4.06* 3.65* 5.56* 2.97*  CALCIUM 8.6 8.5 8.7 8.7  PHOS 3.3  --   --   --     Physical Exam:  Blood pressure 124/71, pulse 90, temperature 97.4 F (36.3 C), temperature source Oral, resp. rate 18, height 5\' 6"  (1.676 m), weight 76.7 kg (169 lb 1.5 oz), SpO2 95.00%. Gen: alert, looks much better Heart: RRR, 1/6 sem lsb, no rub Lungs: R side clear now, L base dec'd Abdomen: Bs pos. ,obese, soft, nontender Ext- trace LE edema +Bruit LUA AVF   Outpatient HD (MWF [place], started HD in Mar '14)  4hrs    EDW 80.5kg   2.25Ca     LFA AVF  Hect 2ug   EPO 6800    Venofer 50mg /wk   Assess/Plan  1 SOB / pulm edema/vol excess / pleural effusions- much better, HD again today and then should be stable from volume standpoint 2 CKD, usual HD MWF 3 Anemia of CKD, on aranesp and weekly IV Fe 4 Sec HPT, cont vit D and binders 5 DM- on insulin 6 Gout, on allopurinol 7 Afib, on amio, ASA; no AC due to GIB in [place]  MD Pager [phone]    Cell  [phone] 02/18/2013, 12:05 PM

## 2013-02-18 NOTE — Progress Notes (Signed)
SUBJECTIVE: [name] says his breathing has improved "quite a bit" since the time of his admission. He denies chest pain, palpitations, and fevers. He is presently on hemodialysis.   Filed Vitals:   02/17/13 1923 02/17/13 2116 02/18/13 0650 02/18/13 1228  BP: 114/65 117/59 124/71 128/68  Pulse: 94 107 90 102  Temp: 97.6 F (36.4 C) 98.3 F (36.8 C) 97.4 F (36.3 C)   TempSrc: Oral Oral Oral   Resp: 15 16 18    Height:      Weight:   169 lb 1.5 oz (76.7 kg)   SpO2: 93% 92% 95%     Intake/Output Summary (Last 24 hours) at 02/18/13 1322 Last data filed at 02/18/13 0816  Gross per 24 hour  Intake    840 ml  Output   2889 ml  Net  -2049 ml    PHYSICAL EXAM General: NAD Neck: No JVD, no thyromegaly or thyroid nodule. Well-healed incision on right. Lungs:Diminished breath sounds at bases bilaterally. CV: Nondisplaced PMI.  Heart regular S1/S2, no S3/S4, no murmur.  No peripheral edema.  No carotid bruit.  Normal pedal pulses.  Abdomen: Soft, nontender, no hepatosplenomegaly, no distention.  Neurologic: Alert and oriented x 3.  Psych: Normal affect. Extremities: No clubbing or cyanosis.   TELEMETRY: Reviewed telemetry pt in sinus rhythm.  LABS: Basic Metabolic Panel:  Recent Labs  78/29/56 1054 02/17/13 0100  NA 131* 137  K 4.8 4.0  CL 91* 97  CO2 28 29  GLUCOSE 106* 98  BUN 32* 14  CREATININE 5.56* 2.97*  CALCIUM 8.7 8.7   Liver Function Tests:  Recent Labs  02/16/13 1054  AST 19  ALT 14  ALKPHOS 177*  BILITOT 0.5  PROT 6.4  ALBUMIN 2.6*   No results found for this basename: LIPASE, AMYLASE,  in the last 72 hours CBC:  Recent Labs  02/16/13 1054 02/17/13 0100  WBC 18.2* 15.6*  NEUTROABS 15.9* 13.3*  HGB 8.8* 8.9*  HCT 28.6* 28.5*  MCV 99.3 97.3  PLT 359 407*   Cardiac Enzymes:  Recent Labs  02/16/13 1900 02/17/13 0100  TROPONINI <0.30 <0.30   BNP: No components found with this basename: POCBNP,  D-Dimer: No results found for  this basename: DDIMER,  in the last 72 hours Hemoglobin A1C: No results found for this basename: HGBA1C,  in the last 72 hours Fasting Lipid Panel: No results found for this basename: CHOL, HDL, LDLCALC, TRIG, CHOLHDL, LDLDIRECT,  in the last 72 hours Thyroid Function Tests: No results found for this basename: TSH, T4TOTAL, FREET3, T3FREE, THYROIDAB,  in the last 72 hours Anemia Panel: No results found for this basename: VITAMINB12, FOLATE, FERRITIN, TIBC, IRON, RETICCTPCT,  in the last 72 hours  RADIOLOGY: Dg Chest 2 View  02/18/2013   *RADIOLOGY REPORT*  Clinical Data: Dyspnea, bilateral pleural effusions  CHEST - 2 VIEW  Comparison: Prior chest x-ray 02/17/2013  Findings: Similar to slightly decreased left pleural effusion. Stable small right pleural effusion.  Stable persistent bibasilar opacities.  Pulmonary vascular congestion with mild interstitial edema.  Query slightly increased opacity in the left upper lung. Stable cardiomegaly.  Surgical changes of prior median sternotomy for multivessel CABG.  Atherosclerotic calcifications noted in the transverse aorta.  Degenerative changes of bilateral glenohumeral joints.  No acute osseous abnormality.  IMPRESSION:  1.  Slightly increased patchy opacity in the left upper lung may reflect developing pneumonia, or atelectasis. 2.  Similar to slightly decreased left pleural effusion and stable small  right pleural effusion with associated bibasilar atelectasis. 3.  Stable cardiomegaly with pulmonary vascular congestion and mild interstitial edema.   Original Report Authenticated By: Malachy Moan, M.D.   Dg Chest 2 View  02/17/2013   *RADIOLOGY REPORT*  Clinical Data:  Shortness of breath post open heart surgery, weakness and soreness  CHEST - 2 VIEW  Comparison: 02/16/2013  Findings: Enlargement of cardiac silhouette post CABG. Bilateral pleural effusions left greater than right increased on left since previous exam. Bibasilar atelectasis greater on the  left. Probable mild perihilar edema. Atherosclerotic calcification aorta. No pneumothorax. Bones demineralized  IMPRESSION: Bibasilar pleural effusions and atelectasis greater on left, increased on left since previous study. Enlargement of cardiac silhouette with pulmonary vascular congestion and suspect mild perihilar pulmonary edema.   Original Report Authenticated By: Ulyses Southward, M.D.    ASSESSMENT AND PLAN: 1. CAD: S/p CABG x 5 on 6/19. He denies chest pain/angina. His dyspnea has improved, and is likely more related to his hemidiaphragmatic paralysis with pleural effusions and possibly a developing pneumonia. Cont current meds - asa/bb. He is intolerant to statins.  2. Left Hemidiaphragm/Pleural Effusions: As above. Followed by CT surgery. 3. ESRD: presently on hemodialysis. 4. Afib: In sinus and on amio. Cont asa. Long term anticoagulation has been deferred as he has a h/o GIB in March with rec for colonoscopy, which is pending.  5. Leukocytosis: Decreasing, continue to follow. Afebrile. Possible pneumonia. 6. DM: Per IM.    Prentice Docker, M.D., F.A.C.C.

## 2013-02-18 NOTE — Procedures (Signed)
I was present at this dialysis session. I have reviewed the session itself and made appropriate changes.   Vinson Moselle  MD Pager [phone]    Cell  [phone] 02/18/2013, 1:01 PM

## 2013-02-18 NOTE — Progress Notes (Signed)
[name]       [name] [address]             [phone]           Subjective: conts to feel better, still requires some O2  Objective  Telemetry sinus rhythm  Temp:  [97.1 F (36.2 C)-98.5 F (36.9 C)] 97.4 F (36.3 C) (07/04 0650) Pulse Rate:  [83-107] 90 (07/04 0650) Resp:  [10-20] 18 (07/04 0650) BP: (86-125)/(51-71) 124/71 mmHg (07/04 0650) SpO2:  [90 %-99 %] 95 % (07/04 0650) Weight:  [169 lb 1.5 oz (76.7 kg)-177 lb 4 oz (80.4 kg)] 169 lb 1.5 oz (76.7 kg) (07/04 0650)   Intake/Output Summary (Last 24 hours) at 02/18/13 1000 Last data filed at 02/18/13 0816  Gross per 24 hour  Intake   1060 ml  Output   2889 ml  Net  -1829 ml       General appearance: alert, cooperative and no distress Heart: regular rate and rhythm Lungs: dim in bases Abdomen: benign Extremities: no edema Wound: incis healing well  Lab Results:  Recent Labs  02/16/13 1054 02/17/13 0100  NA 131* 137  K 4.8 4.0  CL 91* 97  CO2 28 29  GLUCOSE 106* 98  BUN 32* 14  CREATININE 5.56* 2.97*  CALCIUM 8.7 8.7    Recent Labs  02/16/13 1054  AST 19  ALT 14  ALKPHOS 177*  BILITOT 0.5  PROT 6.4  ALBUMIN 2.6*   No results found for this basename: LIPASE, AMYLASE,  in the last 72 hours  Recent Labs  02/16/13 1054 02/17/13 0100  WBC 18.2* 15.6*  NEUTROABS 15.9* 13.3*  HGB 8.8* 8.9*  HCT 28.6* 28.5*  MCV 99.3 97.3  PLT 359 407*    Recent Labs  02/16/13 1900 02/17/13 0100  TROPONINI <0.30 <0.30   No components found with this basename: POCBNP,  No results found for this basename: DDIMER,  in the last 72 hours No results found for this basename: HGBA1C,  in the last 72 hours No results found for this basename: CHOL, HDL, LDLCALC, TRIG, CHOLHDL,  in the last 72 hours No results found for this basename: TSH, T4TOTAL, FREET3, T3FREE, THYROIDAB,  in the last 72 hours No results found for this basename: VITAMINB12, FOLATE, FERRITIN, TIBC, IRON,  RETICCTPCT,  in the last 72 hours  Medications: Scheduled . allopurinol  100 mg Oral BID  . amiodarone  200 mg Oral Daily  . aspirin EC  325 mg Oral Daily  . darbepoetin  100 mcg Intravenous Q Fri-HD  . doxercalciferol  2 mcg Oral 3 times weekly  . enoxaparin (LOVENOX) injection  30 mg Subcutaneous Q24H  . ferric gluconate (FERRLECIT/NULECIT) IV  62.5 mg Intravenous Q Wed-HD  . gabapentin  300 mg Oral BID  . insulin aspart  0-9 Units Subcutaneous TID WC  . insulin glargine  15 Units Subcutaneous QHS  . metoprolol tartrate  12.5 mg Oral BID  . multivitamin  1 tablet Oral QHS  . pantoprazole  40 mg Oral Daily  . sevelamer carbonate  800 mg Oral TID WC  . sodium chloride  3 mL Intravenous Q12H  . tamsulosin  0.4 mg Oral QHS     Radiology/Studies:  Dg Chest 2 View  02/17/2013   *RADIOLOGY REPORT*  Clinical Data:  Shortness of breath post open heart surgery, weakness and soreness  CHEST - 2 VIEW  Comparison: 02/16/2013  Findings: Enlargement of cardiac silhouette  post CABG. Bilateral pleural effusions left greater than right increased on left since previous exam. Bibasilar atelectasis greater on the left. Probable mild perihilar edema. Atherosclerotic calcification aorta. No pneumothorax. Bones demineralized  IMPRESSION: Bibasilar pleural effusions and atelectasis greater on left, increased on left since previous study. Enlargement of cardiac silhouette with pulmonary vascular congestion and suspect mild perihilar pulmonary edema.   Original Report Authenticated By: Ulyses Southward, M.D.   Dg Chest 2 View  02/16/2013   *RADIOLOGY REPORT*  Clinical Data: Chest pain and shortness of breath.  Recent open- heart surgery.  CHEST - 2 VIEW  Comparison: 02/08/2013.  Findings: Very poor inspiration.  Increased bilateral pleural fluid.  No gross change in enlargement of the cardiac silhouette and stable post CABG changes.  Extensive bilateral shoulder degenerative changes and elevation of the left hemidiaphragm  are again demonstrated.  IMPRESSION:  1.  Increased bilateral pleural fluid. 2.  Stable cardiomegaly.   Original Report Authenticated By: Beckie Salts, M.D.   Dg Chest Bilateral Decubitus  02/17/2013   *RADIOLOGY REPORT*  Clinical Data: Bilateral pleural effusions, larger on the left  CHEST - BILATERAL DECUBITUS VIEW  Comparison: Chest radiograph, 03/16/2013  Findings: Moderate left pleural effusion layers dependently on the left lateral decubitus view.  No significant layering fluid is seen on the right lateral decubitus view.  IMPRESSION: Moderate free flowing left pleural effusion.  No significant layering pleural fluid on the right.   Original Report Authenticated By: Amie Portland, M.D.   Dg Sniff Test  02/17/2013   *RADIOLOGY REPORT*  Clinical Data: Persistently elevated left hemidiaphragm.  1 week status post CABG.  SNIFF TEST  Comparison: None.  Findings: There is a large left pleural effusion.  Despite this, I was able to infer hemidiaphragmatic motion based on the motion of the gastric bubble during sniff maneuver.  The left gastric bubble appeared to move cephalad during sniff maneuver, compatible with paradoxical motion and hemidiaphragmatic paralysis on the left.  The right hemidiaphragm move normally.  IMPRESSION:  1.  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  Despite the presence of the left pleural effusion, I was able to infer hemidiaphragmatic position based on motion of the gastric bubble during sniff maneuver.   Original Report Authenticated By: Gaylyn Rong, M.D.    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 He only makes small amt of urine. Blood cx [facility]GSF had UTI last mont , will catheterize if necessary to get specimin 4 medical  management per primary   LOS: 2 days    Nicholas [name] 7/4/201410:00 AM

## 2013-02-18 NOTE — Progress Notes (Signed)
Pt requested sleep medicine tonight. MD notified and new order given. Baron Hamper, RN 02/18/2013

## 2013-02-18 NOTE — Progress Notes (Signed)
Subjective: Nicholas [name] is feeling much better today after his dialysis sessions the last two days.  He is breathing easier and his right side pain is better since he has been taking ibuprofen.  Mild cough has improved.   Objective: Vital signs in last 24 hours: Filed Vitals:   02/17/13 1827 02/17/13 1923 02/17/13 2116 02/18/13 0650  BP: 104/61 114/65 117/59 124/71  Pulse: 92 94 107 90  Temp: 97.1 F (36.2 C) 97.6 F (36.4 C) 98.3 F (36.8 C) 97.4 F (36.3 C)  TempSrc: Oral Oral Oral Oral  Resp: 12 15 16 18   Height:      Weight: 174 lb 6.1 oz (79.1 kg)   169 lb 1.5 oz (76.7 kg)  SpO2: 96% 93% 92% 95%   Weight change: -3 lb 12 oz (-1.7 kg)  Intake/Output Summary (Last 24 hours) at 02/18/13 1003 Last data filed at 02/18/13 0816  Gross per 24 hour  Intake   1060 ml  Output   2889 ml  Net  -1829 ml   PEX General: alert, cooperative, and in no apparent distress HEENT: vision grossly intact, oropharynx clear and non-erythematous  Neck: well healing scar on right neck; supple, no lymphadenopathy, JVD, or carotid bruits Chest: healing midline sternotomy scar Lungs: much less labored breathing; rales in bilateral middle and lower lung fields; no wheezes,ronchi Heart: distant heart sounds, regular rate and rhythm, no murmurs, gallops, or rubs Abdomen: soft, non-tender, non-distended, normal bowel sounds Extremities: trace non-pitting edema, scars on bilateral medial lower legs, scattered bruises; olecranon bursitis on right elbow; 2+ DP/PT pulses bilaterally, no cyanosis, clubbing, or edema Neurologic: alert & oriented X3, cranial nerves II-XII intact, strength grossly intact, sensation intact to light touch  Lab Results: Basic Metabolic Panel:  Recent Labs Lab 02/14/13 1205  02/16/13 1054 02/17/13 0100  NA 133*  < > 131* 137  K 4.4  < > 4.8 4.0  CL 93*  < > 91* 97  CO2 26  < > 28 29  GLUCOSE 112*  < > 106* 98  BUN 35*  < > 32* 14  CREATININE 4.06*  < > 5.56* 2.97*    CALCIUM 8.6  < > 8.7 8.7  PHOS 3.3  --   --   --   < > = values in this interval not displayed. Liver Function Tests:  Recent Labs Lab 02/14/13 1205 02/16/13 1054  AST  --  19  ALT  --  14  ALKPHOS  --  177*  BILITOT  --  0.5  PROT  --  6.4  ALBUMIN 2.7* 2.6*   CBC:  Recent Labs Lab 02/16/13 1054 02/17/13 0100  WBC 18.2* 15.6*  NEUTROABS 15.9* 13.3*  HGB 8.8* 8.9*  HCT 28.6* 28.5*  MCV 99.3 97.3  PLT 359 407*   Cardiac Enzymes:  Recent Labs Lab 02/16/13 1900 02/17/13 0100  TROPONINI <0.30 <0.30   CBG:  Recent Labs Lab 02/17/13 0550 02/17/13 0633 02/17/13 1107 02/17/13 1858 02/17/13 2126 02/18/13 0557  GLUCAP 88 91 266* 114* 231* 116*   Coagulation:  Recent Labs Lab 02/16/13 1054  LABPROT 15.4*  INR 1.25    Studies/Results: Dg Chest 2 View  02/17/2013   *RADIOLOGY REPORT*  Clinical Data:  Shortness of breath post open heart surgery, weakness and soreness  CHEST - 2 VIEW  Comparison: 02/16/2013  Findings: Enlargement of cardiac silhouette post CABG. Bilateral pleural effusions left greater than right increased on left since previous exam. Bibasilar atelectasis greater on the  left. Probable mild perihilar edema. Atherosclerotic calcification aorta. No pneumothorax. Bones demineralized  IMPRESSION: Bibasilar pleural effusions and atelectasis greater on left, increased on left since previous study. Enlargement of cardiac silhouette with pulmonary vascular congestion and suspect mild perihilar pulmonary edema.   Original Report Authenticated By: Ulyses Southward, M.D.   Dg Chest 2 View  02/16/2013   *RADIOLOGY REPORT*  Clinical Data: Chest pain and shortness of breath.  Recent open- heart surgery.  CHEST - 2 VIEW  Comparison: 02/08/2013.  Findings: Very poor inspiration.  Increased bilateral pleural fluid.  No gross change in enlargement of the cardiac silhouette and stable post CABG changes.  Extensive bilateral shoulder degenerative changes and elevation of the  left hemidiaphragm are again demonstrated.  IMPRESSION:  1.  Increased bilateral pleural fluid. 2.  Stable cardiomegaly.   Original Report Authenticated By: Beckie Salts, M.D.   Dg Chest Bilateral Decubitus  02/17/2013   *RADIOLOGY REPORT*  Clinical Data: Bilateral pleural effusions, larger on the left  CHEST - BILATERAL DECUBITUS VIEW  Comparison: Chest radiograph, 03/16/2013  Findings: Moderate left pleural effusion layers dependently on the left lateral decubitus view.  No significant layering fluid is seen on the right lateral decubitus view.  IMPRESSION: Moderate free flowing left pleural effusion.  No significant layering pleural fluid on the right.   Original Report Authenticated By: Amie Portland, M.D.   Dg Sniff Test  02/17/2013   *RADIOLOGY REPORT*  Clinical Data: Persistently elevated left hemidiaphragm.  1 week status post CABG.  SNIFF TEST  Comparison: None.  Findings: There is a large left pleural effusion.  Despite this, I was able to infer hemidiaphragmatic motion based on the motion of the gastric bubble during sniff maneuver.  The left gastric bubble appeared to move cephalad during sniff maneuver, compatible with paradoxical motion and hemidiaphragmatic paralysis on the left.  The right hemidiaphragm move normally.  IMPRESSION:  1.  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  Despite the presence of the left pleural effusion, I was able to infer hemidiaphragmatic position based on motion of the gastric bubble during sniff maneuver.   Original Report Authenticated By: Gaylyn Rong, M.D.   Medications: I have reviewed the patient's current medications. Scheduled Meds: . allopurinol  100 mg Oral BID  . amiodarone  200 mg Oral Daily  . aspirin EC  325 mg Oral Daily  . darbepoetin  100 mcg Intravenous Q Fri-HD  . doxercalciferol  2 mcg Oral 3 times weekly  . enoxaparin (LOVENOX) injection  30 mg Subcutaneous Q24H  . ferric gluconate (FERRLECIT/NULECIT) IV  62.5 mg Intravenous  Q Wed-HD  . gabapentin  300 mg Oral BID  . insulin aspart  0-9 Units Subcutaneous TID WC  . insulin glargine  15 Units Subcutaneous QHS  . metoprolol tartrate  12.5 mg Oral BID  . multivitamin  1 tablet Oral QHS  . pantoprazole  40 mg Oral Daily  . sevelamer carbonate  800 mg Oral TID WC  . sodium chloride  3 mL Intravenous Q12H  . tamsulosin  0.4 mg Oral QHS   PRN Meds:.acetaminophen, diphenhydrAMINE, ibuprofen, ondansetron (ZOFRAN) IV, ondansetron, traMADol Assessment/Plan: Nicholas [name] is a 72 y/o man with PMH of ESRD on HD (since 3/14, MWF), multivessel CAD (MI in 1992, NSTEMI in 3/14; echo in 4/14 showed EF of 50%) and right carotid stenosis s/p CABGx5/RCEA on 02/03/13, atrial fibrillation with RVR in 3/14 (not on anticoagulation), GI bleed in 3/14, COPD, DM2, HTN who presented to the ED from rehab day  after d/c with likely fluid overload; due for dialysis today and likely deconditioned from his recent surgical hospitalization.   1. Dyspnea 2/2 volume overload- Pt came in more dyspneic than baseline in context of likely deconditioning due to recent surgical hospitalization.  Pt went to dialysis on 7/2 and 7/3 and has clinical improvement today as well as improved aeration of both lungs on 7/4 CXR.  In addition, pt put out another 1.7L yesterday (~5L overall).  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  CT surgery is aware for consideration of thoracentesis if unable to remove adequate fluid through dialysis; however, must consider current paralysis of left hemidiaphragm.  BP running higher since metoprolol was discontinued yesterday in context of lower blood pressures with daily dialysis.  Pt now has room to go down on BP and mildly tachycardic so will restart metop at home dose.   -restart metoprolol 12.5mg  po BID  -likely to dialysis again today to aid in fluid removal; Dr. Arlean Hopping from nephrology following, appreciate recs (waiting for note regarding dialysis plan for  weekend)  -repeat CXR tomorrow am  -continue 2L O2, keep O2 sat above 92% -nurse working with pt on incentive spirometry today -continue telemetry -PT/OT recommend pt returning to SNF ([facility]) -consult to social work to discuss medicare coverage of SNF; pt concerned as received phone call saying he would have to pay $300 to keep his bed there  2. Right chest wall pain, sternotomy incisional pain- ruled out ACS with troponins x3 negative given pt's history of MIs; much better now that pt is taking ibuprofen prn -continue ibuprofen prn  3. ESRD- pt has been on HD since 3/14 (usually goes MWF), does not produce any urine -getting daily dialysis for now given volume overload -nephrology on board, appreciate recs  4. Resolving leukocytosis- WBC count down from 18 on admissions to 15 on 7/3 therefore likely a stress response, no evidence of infection. Blood cx x 2 NGTD.  5. COPD- will continue to closely monitor pt's respiratory status though pt does not seem to have a COPD exacerbation at this time given lack of cough/sputum production  -continue home inhalers  6. CAD- recent CABGx5 on 02/03/13 -metoprolol 12.5mg  po BID -continue ASA  7. History of A fib with history of GI bleed- both discovered in 3/14; no anticoagulation for A fib given history of GI bleed but on amiodarone -continue amiodarone -continue pantoprazole  8. DM2 -continue Lantus, SSI -continue gabpentin for neuropathic pain  9. HTN -metoprolol 12.5mg  po BID  10. DVT PPX- SCDs  Dispo: Disposition is deferred at this time, awaiting improvement of current medical problems.  Anticipated discharge on Monday when he can go back to SNF.   The patient does have a current PCP Daniel Nones) and does need an [facility] hospital follow-up appointment after discharge.   .Services Needed at time of discharge: Y = Yes, Blank = No PT:   OT:   RN:   Equipment:   Other:     LOS: 2 days   Rocco Serene, MD 02/18/2013,  10:03 AM

## 2013-02-19 ENCOUNTER — Inpatient Hospital Stay (HOSPITAL_COMMUNITY): Payer: Medicare Other

## 2013-02-19 DIAGNOSIS — I4891 Unspecified atrial fibrillation: Secondary | ICD-10-CM

## 2013-02-19 DIAGNOSIS — I2510 Atherosclerotic heart disease of native coronary artery without angina pectoris: Secondary | ICD-10-CM

## 2013-02-19 LAB — GLUCOSE, CAPILLARY
Glucose-Capillary: 99 mg/dL (ref 70–99)
Glucose-Capillary: 114 mg/dL — ABNORMAL HIGH (ref 70–99)
Glucose-Capillary: 145 mg/dL — ABNORMAL HIGH (ref 70–99)
Glucose-Capillary: 110 mg/dL — ABNORMAL HIGH (ref 70–99)

## 2013-02-19 MED ORDER — ALBUMIN HUMAN 25 % IV SOLN
25.0000 g | Freq: Once | INTRAVENOUS | Status: AC
  Administered 2013-02-19: 25 g via INTRAVENOUS
  Filled 2013-02-19: qty 100

## 2013-02-19 MED ORDER — ALBUMIN HUMAN 25 % IV SOLN
INTRAVENOUS | Status: AC
  Filled 2013-02-19: qty 100

## 2013-02-19 NOTE — Progress Notes (Signed)
Subjective: UP in chair, 2.5kg off with HD yest, BP in 90's to high 80's  Objective Filed Vitals:   02/18/13 1650 02/18/13 2102 02/19/13 0524 02/19/13 1013  BP: 128/74 97/63 130/72 106/62  Pulse: 98 100 89 96  Temp: 97.4 F (36.3 C) 98.4 F (36.9 C) 98.3 F (36.8 C)   TempSrc: Oral Oral Oral   Resp: 20 20 18    Height:      Weight:   76.2 kg (167 lb 15.9 oz)   SpO2: 98% 94% 96%     CBC:  Recent Labs Lab 02/15/13 0445 02/16/13 1054 02/17/13 0100  WBC 11.5* 18.2* 15.6*  NEUTROABS  --  15.9* 13.3*  HGB 7.8* 8.8* 8.9*  HCT 25.2* 28.6* 28.5*  MCV 98.8 99.3 97.3  PLT 343 359 407*   Basic Metabolic Panel / Renal Panel:  Recent Labs Lab 02/14/13 1205 02/15/13 0445 02/16/13 1054 02/17/13 0100  NA 133* 134* 131* 137  K 4.4 4.3 4.8 4.0  CL 93* 96 91* 97  CO2 26 30 28 29   GLUCOSE 112* 111* 106* 98  BUN 35* 24* 32* 14  CREATININE 4.06* 3.65* 5.56* 2.97*  CALCIUM 8.6 8.5 8.7 8.7  PHOS 3.3  --   --   --     Physical Exam:  Blood pressure 106/62, pulse 96, temperature 98.3 F (36.8 C), temperature source Oral, resp. rate 18, height 5\' 6"  (1.676 m), weight 76.2 kg (167 lb 15.9 oz), SpO2 96.00%.  Physical Exam: General:  alert, nad Heart: RRR, 1/6 sem lsb, no rub  Lungs: still rales 1/2 up R , decreased 1/2 up L Abd: obese, BS, soft, nontender  Ext-  LE edema resolved +Bruit LUA AVF   Outpatient HD (MWF [facility] started HD Mar 2014)  4hrs   EDW   80.5kg    2.25Ca    LFA AVF  Hect 2ug EPO 6800 Venofer 50mg /wk   Assessment  1 SOB / pulm edema/vol excess / pleural effusions- starting to improve, making slow progress 2 CKD, usual HD MWF  3 Anemia of CKD,hgb 8.8>8.9 on aranesp182mcg and weekly IV Fe  4 Sec HPT, cont vit D and binders, ca/ phos stable 5 DM- on insulin  6 Gout, on allopurinol  7 Afib, on amio, ASA; no AC due to GIB in Mar   P- extra HD today for vol  Vinson Moselle  MD Pager [phone]    Cell  [phone] 02/19/2013, 12:02  PM

## 2013-02-19 NOTE — Progress Notes (Signed)
Subjective: Nicholas [name] notes he is feeling much better this morning.  He notes improvement in his breathing.  He is amenable to returning to [place] for further rehab.  Objective: Vital signs in last 24 hours: Filed Vitals:   02/18/13 1650 02/18/13 2102 02/19/13 0524 02/19/13 1013  BP: 128/74 97/63 130/72 106/62  Pulse: 98 100 89 96  Temp: 97.4 F (36.3 C) 98.4 F (36.9 C) 98.3 F (36.8 C)   TempSrc: Oral Oral Oral   Resp: 20 20 18    Height:      Weight:   167 lb 15.9 oz (76.2 kg)   SpO2: 98% 94% 96%    Weight change: -1 lb 15.8 oz (-0.9 kg)  Intake/Output Summary (Last 24 hours) at 02/19/13 1109 Last data filed at 02/19/13 0900  Gross per 24 hour  Intake   1142 ml  Output   3025 ml  Net  -1883 ml   PEX General: alert, cooperative, and in no apparent distress HEENT: vision grossly intact, oropharynx clear and non-erythematous  Neck: well healing scar on right neck; supple, no lymphadenopathy Chest: healing midline sternotomy scar Lungs: much less labored breathing; no wheezes,ronchi Heart: distant heart sounds, regular rate and rhythm, no murmurs, gallops, or rubs Abdomen: soft, non-tender, non-distended, normal bowel sounds Extremities: trace non-pitting edema, scars on bilateral medial lower legs, scattered bruises; olecranon bursitis on right elbow; no cyanosis, clubbing, or edema Neurologic: alert & oriented X3, cranial nerves II-XII intact, strength grossly intact, sensation intact to light touch  Lab Results: Basic Metabolic Panel:  Recent Labs Lab 02/14/13 1205  02/16/13 1054 02/17/13 0100  NA 133*  < > 131* 137  K 4.4  < > 4.8 4.0  CL 93*  < > 91* 97  CO2 26  < > 28 29  GLUCOSE 112*  < > 106* 98  BUN 35*  < > 32* 14  CREATININE 4.06*  < > 5.56* 2.97*  CALCIUM 8.6  < > 8.7 8.7  PHOS 3.3  --   --   --   < > = values in this interval not displayed. Liver Function Tests:  Recent Labs Lab 02/14/13 1205 02/16/13 1054  AST  --  19  ALT  --  14    ALKPHOS  --  177*  BILITOT  --  0.5  PROT  --  6.4  ALBUMIN 2.7* 2.6*   CBC:  Recent Labs Lab 02/16/13 1054 02/17/13 0100  WBC 18.2* 15.6*  NEUTROABS 15.9* 13.3*  HGB 8.8* 8.9*  HCT 28.6* 28.5*  MCV 99.3 97.3  PLT 359 407*   Cardiac Enzymes:  Recent Labs Lab 02/16/13 1900 02/17/13 0100  TROPONINI <0.30 <0.30   CBG:  Recent Labs Lab 02/17/13 2126 02/18/13 0557 02/18/13 1126 02/18/13 1655 02/18/13 2148 02/19/13 0646  GLUCAP 231* 116* 151* 123* 99 114*   Coagulation:  Recent Labs Lab 02/16/13 1054  LABPROT 15.4*  INR 1.25    Studies/Results: Dg Chest 2 View  02/19/2013   *RADIOLOGY REPORT*  Clinical Data: Dyspnea.  Follow up pleural effusions  CHEST - 2 VIEW  Comparison: 02/18/2013  Findings: The patient is status post median sternotomy CABG procedure.  Similar appearance of cardiac enlargement.  There are bilateral pleural effusions and interstitial edema consistent with CHF.  IMPRESSION: No change in  CHF pattern.   Original Report Authenticated By: Signa Kell, M.D.   Dg Chest 2 View  02/18/2013   *RADIOLOGY REPORT*  Clinical Data: Dyspnea, bilateral pleural effusions  CHEST - 2 VIEW  Comparison: Prior chest x-ray 02/17/2013  Findings: Similar to slightly decreased left pleural effusion. Stable small right pleural effusion.  Stable persistent bibasilar opacities.  Pulmonary vascular congestion with mild interstitial edema.  Query slightly increased opacity in the left upper lung. Stable cardiomegaly.  Surgical changes of prior median sternotomy for multivessel CABG.  Atherosclerotic calcifications noted in the transverse aorta.  Degenerative changes of bilateral glenohumeral joints.  No acute osseous abnormality.  IMPRESSION:  1.  Slightly increased patchy opacity in the left upper lung may reflect developing pneumonia, or atelectasis. 2.  Similar to slightly decreased left pleural effusion and stable small right pleural effusion with associated bibasilar  atelectasis. 3.  Stable cardiomegaly with pulmonary vascular congestion and mild interstitial edema.   Original Report Authenticated By: Malachy Moan, M.D.   Medications: I have reviewed the patient's current medications. Scheduled Meds: . allopurinol  100 mg Oral BID  . amiodarone  200 mg Oral Daily  . aspirin EC  325 mg Oral Daily  . darbepoetin  100 mcg Intravenous Q Fri-HD  . doxercalciferol  2 mcg Oral 3 times weekly  . enoxaparin (LOVENOX) injection  30 mg Subcutaneous Q24H  . ferric gluconate (FERRLECIT/NULECIT) IV  62.5 mg Intravenous Q Wed-HD  . gabapentin  300 mg Oral BID  . insulin aspart  0-9 Units Subcutaneous TID WC  . insulin glargine  15 Units Subcutaneous QHS  . metoprolol tartrate  12.5 mg Oral BID  . multivitamin  1 tablet Oral QHS  . pantoprazole  40 mg Oral Daily  . sevelamer carbonate  800 mg Oral TID WC  . sodium chloride  3 mL Intravenous Q12H  . tamsulosin  0.4 mg Oral QHS   PRN Meds:.acetaminophen, diphenhydrAMINE, ibuprofen, ondansetron (ZOFRAN) IV, ondansetron, traMADol  Assessment/Plan: Nicholas [name] is a 72 y/o man with PMH of ESRD on HD (since 3/14, MWF), multivessel CAD (MI in 1992, NSTEMI in 3/14; echo in 4/14 showed EF of 50%) and right carotid stenosis s/p CABGx5/RCEA on 02/03/13, atrial fibrillation with RVR in 3/14 (not on anticoagulation), GI bleed in 3/14, COPD, DM2, HTN who presented to the ED from rehab day after d/c with likely fluid overload; due for dialysis today and likely deconditioned from his recent surgical hospitalization.   1. Dyspnea 2/2 volume overload - Pt came in more dyspneic than baseline in context of likely deconditioning due to recent surgical hospitalization.  Pt went to dialysis on 7/2, 7/3, and 7/4 and has clinical improvement today.  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   -continue metoprolol 12.5mg  po BID  -Renal following, appreciate recs.  Plan to resume regular MWF dialysis -continue 2L O2, keep  O2 sat above 92%. Pt will likely need at discharge -continue incentive spirometry -continue telemetry -PT/OT recommend pt returning to SNF ([facility])  2. Right chest wall pain, sternotomy incisional pain - ruled out ACS with troponins x3 negative given pt's history of MIs; much better now that pt is taking ibuprofen prn -continue ibuprofen prn  3. ESRD - pt has been on HD since 3/14 (usually goes MWF).  Patient is anuric. -getting daily dialysis for now given volume overload -nephrology on board, appreciate recs  4. Leukocytosis - Resolved. WBC count 18 on admission, likely indicating stress response. No evidence of infection, and blood cx x 2 NGTD.  5. COPD - will continue to closely monitor pt's respiratory status though pt does not seem to have a COPD exacerbation at this  time given lack of cough/sputum production  -continue home inhalers  6. CAD - recent CABGx5 on 02/03/13 -metoprolol 12.5mg  po BID -continue ASA  7. History of A fib with history of GI bleed - both discovered in 3/14; Holding anticoagulation until colonoscopy can be performed, due to GI bleed.  Continuing amiodarone for now per cardiology recs.  Hopefully this will be a short-term medication, since it can lead to long-term lung pathology, but it is likely necessary until the patient can undergo colonoscopy, and subsequently hopefully start anticoagulation. -continue amiodarone -continue pantoprazole  8. DM2 -continue Lantus, SSI -continue gabpentin for neuropathic pain  9. HTN -metoprolol 12.5mg  po BID  10. DVT PPX - SCDs  Dispo: Disposition is deferred at this time, awaiting improvement of current medical problems.  Anticipated discharge on Monday when he can go back to SNF.   The patient does have a current PCP Daniel Nones) and does not need an [facility] hospital follow-up appointment after discharge.   .Services Needed at time of discharge: Y = Yes, Blank = No PT:   OT:   RN:   Equipment:     Other:     LOS: 3 days   Linward Headland, MD 02/19/2013, 11:09 AM

## 2013-02-19 NOTE — Progress Notes (Addendum)
[name]       [name] [address]             [phone]           Subjective: Feels better, conts with some SOB  Objective  Telemetry sinus rhythm   Temp:  [97.4 F (36.3 C)-98.4 F (36.9 C)] 98.3 F (36.8 C) (07/05 0524) Pulse Rate:  [70-102] 96 (07/05 1013) Resp:  [13-21] 18 (07/05 0524) BP: (80-130)/(52-74) 106/62 mmHg (07/05 1013) SpO2:  [94 %-98 %] 96 % (07/05 0524) Weight:  [167 lb 15.9 oz (76.2 kg)-175 lb 4.3 oz (79.5 kg)] 167 lb 15.9 oz (76.2 kg) (07/05 0524)   Intake/Output Summary (Last 24 hours) at 02/19/13 1019 Last data filed at 02/18/13 2200  Gross per 24 hour  Intake    662 ml  Output   3025 ml  Net  -2363 ml       General appearance: alert, cooperative and no distress Heart: regular rate and rhythm Lungs: dim in lower fields Extremities: no LE edema Wound: incisions healing well  Lab Results:  Recent Labs  02/16/13 1054 02/17/13 0100  NA 131* 137  K 4.8 4.0  CL 91* 97  CO2 28 29  GLUCOSE 106* 98  BUN 32* 14  CREATININE 5.56* 2.97*  CALCIUM 8.7 8.7    Recent Labs  02/16/13 1054  AST 19  ALT 14  ALKPHOS 177*  BILITOT 0.5  PROT 6.4  ALBUMIN 2.6*   No results found for this basename: LIPASE, AMYLASE,  in the last 72 hours  Recent Labs  02/16/13 1054 02/17/13 0100  WBC 18.2* 15.6*  NEUTROABS 15.9* 13.3*  HGB 8.8* 8.9*  HCT 28.6* 28.5*  MCV 99.3 97.3  PLT 359 407*    Recent Labs  02/16/13 1900 02/17/13 0100  TROPONINI <0.30 <0.30   No components found with this basename: POCBNP,  No results found for this basename: DDIMER,  in the last 72 hours No results found for this basename: HGBA1C,  in the last 72 hours No results found for this basename: CHOL, HDL, LDLCALC, TRIG, CHOLHDL,  in the last 72 hours No results found for this basename: TSH, T4TOTAL, FREET3, T3FREE, THYROIDAB,  in the last 72 hours No results found for this basename: VITAMINB12, FOLATE, FERRITIN, TIBC, IRON,  RETICCTPCT,  in the last 72 hours  Medications: Scheduled . allopurinol  100 mg Oral BID  . amiodarone  200 mg Oral Daily  . aspirin EC  325 mg Oral Daily  . darbepoetin  100 mcg Intravenous Q Fri-HD  . doxercalciferol  2 mcg Oral 3 times weekly  . enoxaparin (LOVENOX) injection  30 mg Subcutaneous Q24H  . ferric gluconate (FERRLECIT/NULECIT) IV  62.5 mg Intravenous Q Wed-HD  . gabapentin  300 mg Oral BID  . insulin aspart  0-9 Units Subcutaneous TID WC  . insulin glargine  15 Units Subcutaneous QHS  . metoprolol tartrate  12.5 mg Oral BID  . multivitamin  1 tablet Oral QHS  . pantoprazole  40 mg Oral Daily  . sevelamer carbonate  800 mg Oral TID WC  . sodium chloride  3 mL Intravenous Q12H  . tamsulosin  0.4 mg Oral QHS     Radiology/Studies:  Dg Chest 2 View  02/19/2013   *RADIOLOGY REPORT*  Clinical Data: Dyspnea.  Follow up pleural effusions  CHEST - 2 VIEW  Comparison: 02/18/2013  Findings: The patient is status post median sternotomy CABG procedure.  Similar appearance of cardiac enlargement.  There are bilateral pleural effusions and interstitial edema consistent with CHF.  IMPRESSION: No change in  CHF pattern.   Original Report Authenticated By: Signa Kell, M.D.   Dg Chest 2 View  02/18/2013   *RADIOLOGY REPORT*  Clinical Data: Dyspnea, bilateral pleural effusions  CHEST - 2 VIEW  Comparison: Prior chest x-ray 02/17/2013  Findings: Similar to slightly decreased left pleural effusion. Stable small right pleural effusion.  Stable persistent bibasilar opacities.  Pulmonary vascular congestion with mild interstitial edema.  Query slightly increased opacity in the left upper lung. Stable cardiomegaly.  Surgical changes of prior median sternotomy for multivessel CABG.  Atherosclerotic calcifications noted in the transverse aorta.  Degenerative changes of bilateral glenohumeral joints.  No acute osseous abnormality.  IMPRESSION:  1.  Slightly increased patchy opacity in the left upper  lung may reflect developing pneumonia, or atelectasis. 2.  Similar to slightly decreased left pleural effusion and stable small right pleural effusion with associated bibasilar atelectasis. 3.  Stable cardiomegaly with pulmonary vascular congestion and mild interstitial edema.   Original Report Authenticated By: Malachy Moan, M.D.   Dg Chest Bilateral Decubitus  02/17/2013   *RADIOLOGY REPORT*  Clinical Data: Bilateral pleural effusions, larger on the left  CHEST - BILATERAL DECUBITUS VIEW  Comparison: Chest radiograph, 03/16/2013  Findings: Moderate left pleural effusion layers dependently on the left lateral decubitus view.  No significant layering fluid is seen on the right lateral decubitus view.  IMPRESSION: Moderate free flowing left pleural effusion.  No significant layering pleural fluid on the right.   Original Report Authenticated By: Amie Portland, M.D.   Dg Sniff Test  02/17/2013   *RADIOLOGY REPORT*  Clinical Data: Persistently elevated left hemidiaphragm.  1 week status post CABG.  SNIFF TEST  Comparison: None.  Findings: There is a large left pleural effusion.  Despite this, I was able to infer hemidiaphragmatic motion based on the motion of the gastric bubble during sniff maneuver.  The left gastric bubble appeared to move cephalad during sniff maneuver, compatible with paradoxical motion and hemidiaphragmatic paralysis on the left.  The right hemidiaphragm move normally.  IMPRESSION:  1.  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  Despite the presence of the left pleural effusion, I was able to infer hemidiaphragmatic position based on motion of the gastric bubble during sniff maneuver.   Original Report Authenticated By: Gaylyn Rong, M.D.    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 He would probably benefit from US guided left thoracentesis to minimize left lung atelectasis.

## 2013-02-19 NOTE — Progress Notes (Signed)
Clinical Social Work Department BRIEF PSYCHOSOCIAL ASSESSMENT 02/19/2013  Patient:  TAYTE, MCWHERTER     Account Number:  [id number]     Admit date:  02/16/2013  Clinical Social Worker:  Illene Silver  Date/Time:  02/19/2013 03:25 PM  Referred by:  Physician  Date Referred:  02/19/2013 Referred for  SNF Placement   Other Referral:   Interview type:  Other - See comment Other interview type:   Chart review. Pt off unit in HD.  CSW will f/u.    PSYCHOSOCIAL DATA Living Status:  FACILITY Admitted from facility:  [facility] [place] Level of care:  Skilled Nursing Facility Primary support name:  rodney Primary support relationship to patient:  SIBLING Degree of support available:   Undetermined.    CURRENT CONCERNS Current Concerns  Post-Acute Placement   Other Concerns:    SOCIAL WORK ASSESSMENT / PLAN Chart reviewed.  Pt was at [facility] getting rehab PTA. Per MD note, pt agreeable to returning there at d/c.  CSW to f/u with facility and facilitate d/c as appropropriate.   Assessment/plan status:  Psychosocial Support/Ongoing Assessment of Needs Other assessment/ plan:   Information/referral to community resources:    PATIENT'S/FAMILY'S RESPONSE TO PLAN OF CARE: Undetermined.  Unit CSW to f/u.

## 2013-02-19 NOTE — Progress Notes (Signed)
Patient ID: Nicholas [name], male   DOB: [birth year], 72 y.o.   MRN: [id number]    SUBJECTIVE: Some better but mechanical work of breathing still significant   Filed Vitals:   02/18/13 1606 02/18/13 1650 02/18/13 2102 02/19/13 0524  BP: 107/67 128/74 97/63 130/72  Pulse: 91 98 100 89  Temp: 97.5 F (36.4 C) 97.4 F (36.3 C) 98.4 F (36.9 C) 98.3 F (36.8 C)  TempSrc: Oral Oral Oral Oral  Resp: 21 20 20 18   Height:      Weight: 168 lb 14 oz (76.6 kg)   167 lb 15.9 oz (76.2 kg)  SpO2: 96% 98% 94% 96%    Intake/Output Summary (Last 24 hours) at 02/19/13 1191 Last data filed at 02/18/13 2200  Gross per 24 hour  Intake    662 ml  Output   3025 ml  Net  -2363 ml    PHYSICAL EXAM General: NAD Neck: No JVD, no thyromegaly or thyroid nodule. Well-healed incision on right. Lungs:Diminished breath sounds at bases bilaterally. Left greater than right  CV: Nondisplaced PMI.  Heart regular S1/S2, no S3/S4, no murmur.  No peripheral edema.  No carotid bruit.  Normal pedal pulses.  Abdomen: Soft, nontender, no hepatosplenomegaly, no distention.  Neurologic: Alert and oriented x 3.  Psych: Normal affect. Extremities: No clubbing or cyanosis.   TELEMETRY: Reviewed telemetry pt in sinus rhythm.  LABS: Basic Metabolic Panel:  Recent Labs  47/82/95 1054 02/17/13 0100  NA 131* 137  K 4.8 4.0  CL 91* 97  CO2 28 29  GLUCOSE 106* 98  BUN 32* 14  CREATININE 5.56* 2.97*  CALCIUM 8.7 8.7   Liver Function Tests:  Recent Labs  02/16/13 1054  AST 19  ALT 14  ALKPHOS 177*  BILITOT 0.5  PROT 6.4  ALBUMIN 2.6*   No results found for this basename: LIPASE, AMYLASE,  in the last 72 hours CBC:  Recent Labs  02/16/13 1054 02/17/13 0100  WBC 18.2* 15.6*  NEUTROABS 15.9* 13.3*  HGB 8.8* 8.9*  HCT 28.6* 28.5*  MCV 99.3 97.3  PLT 359 407*   Cardiac Enzymes:  Recent Labs  02/16/13 1900 02/17/13 0100  TROPONINI <0.30 <0.30   BNP: No components found with this basename:  POCBNP,  D-Dimer: No results found for this basename: DDIMER,  in the last 72 hours Hemoglobin A1C: No results found for this basename: HGBA1C,  in the last 72 hours Fasting Lipid Panel: No results found for this basename: CHOL, HDL, LDLCALC, TRIG, CHOLHDL, LDLDIRECT,  in the last 72 hours Thyroid Function Tests: No results found for this basename: TSH, T4TOTAL, FREET3, T3FREE, THYROIDAB,  in the last 72 hours Anemia Panel: No results found for this basename: VITAMINB12, FOLATE, FERRITIN, TIBC, IRON, RETICCTPCT,  in the last 72 hours  RADIOLOGY: Dg Chest 2 View  02/18/2013   *RADIOLOGY REPORT*  Clinical Data: Dyspnea, bilateral pleural effusions  CHEST - 2 VIEW  Comparison: Prior chest x-ray 02/17/2013  Findings: Similar to slightly decreased left pleural effusion. Stable small right pleural effusion.  Stable persistent bibasilar opacities.  Pulmonary vascular congestion with mild interstitial edema.  Query slightly increased opacity in the left upper lung. Stable cardiomegaly.  Surgical changes of prior median sternotomy for multivessel CABG.  Atherosclerotic calcifications noted in the transverse aorta.  Degenerative changes of bilateral glenohumeral joints.  No acute osseous abnormality.  IMPRESSION:  1.  Slightly increased patchy opacity in the left upper lung may reflect developing pneumonia, or atelectasis. 2.  Similar to slightly decreased left pleural effusion and stable small right pleural effusion with associated bibasilar atelectasis. 3.  Stable cardiomegaly with pulmonary vascular congestion and mild interstitial edema.   Original Report Authenticated By: Malachy Moan, M.D.   Dg Chest 2 View  02/17/2013   *RADIOLOGY REPORT*  Clinical Data:  Shortness of breath post open heart surgery, weakness and soreness  CHEST - 2 VIEW  Comparison: 02/16/2013  Findings: Enlargement of cardiac silhouette post CABG. Bilateral pleural effusions left greater than right increased on left since previous  exam. Bibasilar atelectasis greater on the left. Probable mild perihilar edema. Atherosclerotic calcification aorta. No pneumothorax. Bones demineralized  IMPRESSION: Bibasilar pleural effusions and atelectasis greater on left, increased on left since previous study. Enlargement of cardiac silhouette with pulmonary vascular congestion and suspect mild perihilar pulmonary edema.   Original Report Authenticated By: Ulyses Southward, M.D.    ASSESSMENT AND PLAN: 1. CAD: S/p CABG x 5 on 6/19. He denies chest pain/angina. His dyspnea has improved, and is likely more related to his hemidiaphragmatic paralysis with pleural effusions and possibly a developing pneumonia. Cont current meds - asa/bb. He is intolerant to statins.  2. Left Hemidiaphragm/Pleural Effusions: As above. Followed by CT surgery. CXR with little improvement yesterday.   3. ESRD: presently on hemodialysis. 4. Afib: In sinus and on amio. Cont asa. Long term anticoagulation has been deferred as he has a h/o GIB in March with rec for colonoscopy, which is pending.  5. Leukocytosis: Decreasing, continue to follow. Afebrile. Possible pneumonia. 6. DM: Per IM.   Charlton Haws

## 2013-02-19 NOTE — Progress Notes (Deleted)
Subjective:  Sitting up in bedside chair,"feeling better""  Denies any sob currently Objective Vital signs in last 24 hours: Filed Vitals:   02/18/13 1650 02/18/13 2102 02/19/13 0524 02/19/13 1013  BP: 128/74 97/63 130/72 106/62  Pulse: 98 100 89 96  Temp: 97.4 F (36.3 C) 98.4 F (36.9 C) 98.3 F (36.8 C)   TempSrc: Oral Oral Oral   Resp: 20 20 18    Height:      Weight:   76.2 kg (167 lb 15.9 oz)   SpO2: 98% 94% 96%    Weight change: -0.9 kg (-1 lb 15.8 oz)  Intake/Output Summary (Last 24 hours) at 02/19/13 1031 Last data filed at 02/19/13 0900  Gross per 24 hour  Intake   1142 ml  Output   3025 ml  Net  -1883 ml   Labs: Basic Metabolic Panel:  Recent Labs Lab 02/14/13 1205 02/15/13 0445 02/16/13 1054 02/17/13 0100  NA 133* 134* 131* 137  K 4.4 4.3 4.8 4.0  CL 93* 96 91* 97  CO2 26 30 28 29   GLUCOSE 112* 111* 106* 98  BUN 35* 24* 32* 14  CREATININE 4.06* 3.65* 5.56* 2.97*  CALCIUM 8.6 8.5 8.7 8.7  PHOS 3.3  --   --   --    Liver Function Tests:  Recent Labs Lab 02/14/13 1205 02/16/13 1054  AST  --  19  ALT  --  14  ALKPHOS  --  177*  BILITOT  --  0.5  PROT  --  6.4  ALBUMIN 2.7* 2.6*    CBC:  Recent Labs Lab 02/14/13 1205 02/15/13 0445 02/16/13 1054 02/17/13 0100  WBC 11.5* 11.5* 18.2* 15.6*  NEUTROABS  --   --  15.9* 13.3*  HGB 8.6* 7.8* 8.8* 8.9*  HCT 26.9* 25.2* 28.6* 28.5*  MCV 95.7 98.8 99.3 97.3  PLT 319 343 359 407*   Cardiac Enzymes:  Recent Labs Lab 02/16/13 1900 02/17/13 0100  TROPONINI <0.30 <0.30   CBG:  Recent Labs Lab 02/18/13 0557 02/18/13 1126 02/18/13 1655 02/18/13 2148 02/19/13 0646  GLUCAP 116* 151* 123* 99 114*  CHEST - 2 VIEW  02/19/13 Comparison: 02/18/2013  Findings: The patient is status post median sternotomy CABG  procedure. Similar appearance of cardiac enlargement. There are  bilateral pleural effusions and interstitial edema consistent with  CHF.  IMPRESSION:  No change in CHF pattern.   Original Report Authenticated By: Signa Kell, M.D.   Medications:   . allopurinol  100 mg Oral BID  . amiodarone  200 mg Oral Daily  . aspirin EC  325 mg Oral Daily  . darbepoetin  100 mcg Intravenous Q Fri-HD  . doxercalciferol  2 mcg Oral 3 times weekly  . enoxaparin (LOVENOX) injection  30 mg Subcutaneous Q24H  . ferric gluconate (FERRLECIT/NULECIT) IV  62.5 mg Intravenous Q Wed-HD  . gabapentin  300 mg Oral BID  . insulin aspart  0-9 Units Subcutaneous TID WC  . insulin glargine  15 Units Subcutaneous QHS  . metoprolol tartrate  12.5 mg Oral BID  . multivitamin  1 tablet Oral QHS  . pantoprazole  40 mg Oral Daily  . sevelamer carbonate  800 mg Oral TID WC  . sodium chloride  3 mL Intravenous Q12H  . tamsulosin  0.4 mg Oral QHS    Physical Exam: General:  alert, nad Heart: RRR, 1/6 sem lsb, no rub  Lungs: Bibasilar sounds decr and right upperr fields cta since admit exam  Abdomen: Obese, Bs  pos., soft, nontender  Ext-  LE edema resolved +Bruit LUA AVF   Outpatient HD (MWF [place], started HD in Mar '14)  4hrs EDW 80.5kg 2.25Ca LFA AVF  Hect 2ug EPO 6800 Venofer 50mg /wk   Assess/Plan  1 SOB / pulm edema/vol excess / pleural effusions- yesterday 3RD consec. HD  Tolerated uf  WT . Now down~7 kg from Admit  After uf on hd, 96% o2 sat this am with O2 [place]/despite cxr showing chf / hold hd today 2 CKD, usual HD MWF  3 Anemia of CKD,hgb 8.8>8.9 on aranesp172mcg and weekly IV Fe  4 Sec HPT, cont vit D and binders with ca/ phos stable 5 DM- on insulin  6 Gout, on allopurinol  7 Afib, on amio, ASA; no AC due to GIB in Mar   Lenny Pastel, PA-C [facility] Bee[facility] [phone] 02/19/2013,10:31 AM  LOS: 3 days

## 2013-02-20 LAB — CBC
Hemoglobin: 9.3 g/dL — ABNORMAL LOW (ref 13.0–17.0)
MCH: 30.9 pg (ref 26.0–34.0)
MCHC: 30.9 g/dL (ref 30.0–36.0)
RDW: 17.5 % — ABNORMAL HIGH (ref 11.5–15.5)

## 2013-02-20 LAB — RENAL FUNCTION PANEL
Albumin: 2.9 g/dL — ABNORMAL LOW (ref 3.5–5.2)
Calcium: 9.2 mg/dL (ref 8.4–10.5)
Creatinine, Ser: 3.2 mg/dL — ABNORMAL HIGH (ref 0.50–1.35)
GFR calc non Af Amer: 18 mL/min — ABNORMAL LOW (ref >90)
Phosphorus: 2.3 mg/dL (ref 2.3–4.6)

## 2013-02-20 LAB — GLUCOSE, CAPILLARY: Glucose-Capillary: 115 mg/dL — ABNORMAL HIGH (ref 70–99)

## 2013-02-20 MED ORDER — SODIUM CHLORIDE 0.9 % IV SOLN: 100.0000 mL | INTRAVENOUS | Status: DC | PRN

## 2013-02-20 MED ORDER — LIDOCAINE-PRILOCAINE 2.5-2.5 % EX CREA: 1.0000 "application " | TOPICAL_CREAM | CUTANEOUS | Status: DC | PRN

## 2013-02-20 MED ORDER — SENNOSIDES-DOCUSATE SODIUM 8.6-50 MG PO TABS
1.0000 | ORAL_TABLET | Freq: Once | ORAL | Status: AC
  Administered 2013-02-20: 1 via ORAL
  Filled 2013-02-20: qty 1

## 2013-02-20 MED ORDER — LIDOCAINE HCL (PF) 1 % IJ SOLN: 5.0000 mL | INTRAMUSCULAR | Status: DC | PRN

## 2013-02-20 MED ORDER — HEPARIN SODIUM (PORCINE) 1000 UNIT/ML DIALYSIS: 2000.0000 [IU] | INTRAMUSCULAR | Status: DC | PRN

## 2013-02-20 MED ORDER — ALTEPLASE 2 MG IJ SOLR: 2.0000 mg | Freq: Once | INTRAMUSCULAR | Status: DC | PRN

## 2013-02-20 MED ORDER — NEPRO/CARBSTEADY PO LIQD: 237.0000 mL | ORAL | Status: DC | PRN

## 2013-02-20 MED ORDER — PENTAFLUOROPROP-TETRAFLUOROETH EX AERO: 1.0000 "application " | INHALATION_SPRAY | CUTANEOUS | Status: DC | PRN

## 2013-02-20 MED ORDER — HEPARIN SODIUM (PORCINE) 1000 UNIT/ML DIALYSIS: 1000.0000 [IU] | INTRAMUSCULAR | Status: DC | PRN

## 2013-02-20 NOTE — Progress Notes (Signed)
Subjective: Got up first time and walked yest, breathing better, not to baseline. 2.2kg off yest w HD  Objective Filed Vitals:   02/19/13 1634 02/19/13 1718 02/19/13 2041 02/20/13 0632  BP: 119/73 135/80 116/61 109/62  Pulse: 95 80 80 94  Temp: 97.3 F (36.3 C) 96.5 F (35.8 C) 97.7 F (36.5 C) 98.8 F (37.1 C)  TempSrc: Oral Oral Oral Oral  Resp: 18 18 18 19   Height:      Weight: 74.9 kg (165 lb 2 oz)   75.9 kg (167 lb 5.3 oz)  SpO2: 100% 100% 100% 97%    CBC:  Recent Labs Lab 02/16/13 1054 02/17/13 0100 02/20/13 0615  WBC 18.2* 15.6* 9.7  NEUTROABS 15.9* 13.3*  --   HGB 8.8* 8.9* 9.3*  HCT 28.6* 28.5* 30.1*  MCV 99.3 97.3 100.0  PLT 359 407* 382   Basic Metabolic Panel / Renal Panel:  Recent Labs Lab 02/14/13 1205  02/16/13 1054 02/17/13 0100 02/20/13 0615  NA 133*  < > 131* 137 135  K 4.4  < > 4.8 4.0 4.3  CL 93*  < > 91* 97 97  CO2 26  < > 28 29 27   GLUCOSE 112*  < > 106* 98 115*  BUN 35*  < > 32* 14 20  CREATININE 4.06*  < > 5.56* 2.97* 3.20*  CALCIUM 8.6  < > 8.7 8.7 9.2  PHOS 3.3  --   --   --  2.3  < > = values in this interval not displayed.  Physical Exam:  Blood pressure 109/62, pulse 94, temperature 98.8 F (37.1 C), temperature source Oral, resp. rate 19, height 5\' 6"  (1.676 m), weight 75.9 kg (167 lb 5.3 oz), SpO2 97.00%.  Physical Exam: General:  alert, nad, audible crackles w breathing No JVD Heart: RRR, 1/6 sem lsb, no rub  Lungs: rales down 1/3 up R , decreased 1/3 up L Abd: obese, BS, soft, nontender  Ext- no LE edema +Bruit LUA AVF   Outpatient HD (MWF [facility] started HD Mar 2014)  4hrs   EDW 80.5kg    2.25Ca    LFA AVF  Heparin none (GIB in Mar) Hect 2ug EPO 6800 Venofer 50mg /wk   Assessment  1 SOB / pulm edema/vol excess / pleural effusions- improving, still vol overloaded 2 CKD, usual HD MWF  3 Anemia of CKD,hgb 8.8>8.9 on aranesp190mcg and weekly IV Fe  4 Sec HPT, cont vit D and binders, ca/ phos stable 5 DM- on  insulin  6 Gout, on allopurinol  7 Afib, on amio, ASA, BB; no AC due to GIB in Mar 8 s/p CABG and CEA 02/03/13  P- extra HD today again for volume, still needs a few more days of daily HD as he won't tolerate large amts of UF per treatment  Nicholas Moselle  MD Pager [phone]    Cell  [phone] 02/20/2013, 9:46 AM

## 2013-02-20 NOTE — Progress Notes (Signed)
[name]       [name] [address]             [phone]           Subjective: conts to feel stronger, breathing improving  Objective  Telemetry sinus rhythm/tach  Temp:  [96.5 F (35.8 C)-98.8 F (37.1 C)] 98.8 F (37.1 C) (07/06 4696) Pulse Rate:  [80-96] 94 (07/06 0632) Resp:  [12-19] 19 (07/06 0632) BP: (75-135)/(52-80) 109/62 mmHg (07/06 0632) SpO2:  [90 %-100 %] 97 % (07/06 2952) Weight:  [165 lb 2 oz (74.9 kg)-170 lb 3.1 oz (77.2 kg)] 167 lb 5.3 oz (75.9 kg) (07/06 8413)   Intake/Output Summary (Last 24 hours) at 02/20/13 0906 Last data filed at 02/20/13 0850  Gross per 24 hour  Intake   1160 ml  Output   2208 ml  Net  -1048 ml       General appearance: alert, cooperative and no distress Heart: regular rate and rhythm Lungs: dim L>R lower fields  Lab Results:  Recent Labs  02/20/13 0615  NA 135  K 4.3  CL 97  CO2 27  GLUCOSE 115*  BUN 20  CREATININE 3.20*  CALCIUM 9.2  PHOS 2.3    Recent Labs  02/20/13 0615  ALBUMIN 2.9*   No results found for this basename: LIPASE, AMYLASE,  in the last 72 hours  Recent Labs  02/20/13 0615  WBC 9.7  HGB 9.3*  HCT 30.1*  MCV 100.0  PLT 382   No results found for this basename: CKTOTAL, CKMB, TROPONINI,  in the last 72 hours No components found with this basename: POCBNP,  No results found for this basename: DDIMER,  in the last 72 hours No results found for this basename: HGBA1C,  in the last 72 hours No results found for this basename: CHOL, HDL, LDLCALC, TRIG, CHOLHDL,  in the last 72 hours No results found for this basename: TSH, T4TOTAL, FREET3, T3FREE, THYROIDAB,  in the last 72 hours No results found for this basename: VITAMINB12, FOLATE, FERRITIN, TIBC, IRON, RETICCTPCT,  in the last 72 hours  Medications: Scheduled . allopurinol  100 mg Oral BID  . amiodarone  200 mg Oral Daily  . aspirin EC  325 mg Oral Daily  . darbepoetin  100 mcg Intravenous Q Fri-HD    . doxercalciferol  2 mcg Oral 3 times weekly  . enoxaparin (LOVENOX) injection  30 mg Subcutaneous Q24H  . ferric gluconate (FERRLECIT/NULECIT) IV  62.5 mg Intravenous Q Wed-HD  . gabapentin  300 mg Oral BID  . insulin aspart  0-9 Units Subcutaneous TID WC  . insulin glargine  15 Units Subcutaneous QHS  . metoprolol tartrate  12.5 mg Oral BID  . multivitamin  1 tablet Oral QHS  . pantoprazole  40 mg Oral Daily  . sevelamer carbonate  800 mg Oral TID WC  . sodium chloride  3 mL Intravenous Q12H  . tamsulosin  0.4 mg Oral QHS     Radiology/Studies:  Dg Chest 2 View  02/19/2013   *RADIOLOGY REPORT*  Clinical Data: Dyspnea.  Follow up pleural effusions  CHEST - 2 VIEW  Comparison: 02/18/2013  Findings: The patient is status post median sternotomy CABG procedure.  Similar appearance of cardiac enlargement.  There are bilateral pleural effusions and interstitial edema consistent with CHF.  IMPRESSION: No change in  CHF pattern.   Original Report Authenticated By: Signa Kell, M.D.    INR: Will add  last result for INR, ABG once components are confirmed Will add last 4 CBG results once components are confirmed  Assessment/Plan: 1 steady progress, did get dialysis yesterday. Conts to have significant volume removed.  Dr Laneta Simmers recs left  US guided thoracentesis.    LOS: 4 days    Nicholas [name],Nicholas [name] 7/6/20149:06 AM

## 2013-02-20 NOTE — Procedures (Signed)
I was present at this dialysis session. I have reviewed the session itself and made appropriate changes.   Vinson Moselle  MD Pager [phone]    Cell  [phone] 02/20/2013, 1:50 PM
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
## 2013-02-20 NOTE — Progress Notes (Addendum)
Subjective: Mr. Corkery continues to improve and breathe easier. He had dialysis again yesterday but will be back on his regular MWF schedule starting tomorrow. His right sided pain is also improved, he continues to take ibuprofen prn.   Objective: Vital signs in last 24 hours: Filed Vitals:   02/19/13 1634 02/19/13 1718 02/19/13 2041 02/20/13 0632  BP: 119/73 135/80 116/61 109/62  Pulse: 95 80 80 94  Temp: 97.3 F (36.3 C) 96.5 F (35.8 C) 97.7 F (36.5 C) 98.8 F (37.1 C)  TempSrc: Oral Oral Oral Oral  Resp: 18 18 18 19   Height:      Weight: 165 lb 2 oz (74.9 kg)   167 lb 5.3 oz (75.9 kg)  SpO2: 100% 100% 100% 97%   Weight change: -5 lb 1.1 oz (-2.3 kg)  Intake/Output Summary (Last 24 hours) at 02/20/13 1610 Last data filed at 02/20/13 0600  Gross per 24 hour  Intake   1520 ml  Output   2208 ml  Net   -688 ml   PEX General: alert, cooperative, and in no apparent distress HEENT: vision grossly intact, oropharynx clear and non-erythematous  Neck: well healing scar on right neck; supple, no lymphadenopathy, JVD, or carotid bruits Chest: healing midline sternotomy scar Lungs: much less labored breathing; soft rales in bilateral lower lung fields; no wheezes,ronchi Heart: distant heart sounds, regular rate and rhythm, no murmurs, gallops, or rubs Abdomen: soft, non-tender, non-distended, normal bowel sounds Extremities: trace non-pitting edema, scars on bilateral medial lower legs, scattered bruises; olecranon bursitis on right elbow; 2+ DP/PT pulses bilaterally, no cyanosis or clubbing Neurologic: alert & oriented X3, cranial nerves II-XII intact, strength grossly intact, sensation intact to light touch  Lab Results: Basic Metabolic Panel:  Recent Labs Lab 02/14/13 1205  02/17/13 0100 02/20/13 0615  NA 133*  < > 137 135  K 4.4  < > 4.0 4.3  CL 93*  < > 97 97  CO2 26  < > 29 27  GLUCOSE 112*  < > 98 115*  BUN 35*  < > 14 20  CREATININE 4.06*  < > 2.97* 3.20*    CALCIUM 8.6  < > 8.7 9.2  PHOS 3.3  --   --  2.3  < > = values in this interval not displayed. Liver Function Tests:  Recent Labs Lab 02/16/13 1054 02/20/13 0615  AST 19  --   ALT 14  --   ALKPHOS 177*  --   BILITOT 0.5  --   PROT 6.4  --   ALBUMIN 2.6* 2.9*   CBC:  Recent Labs Lab 02/16/13 1054 02/17/13 0100 02/20/13 0615  WBC 18.2* 15.6* 9.7  NEUTROABS 15.9* 13.3*  --   HGB 8.8* 8.9* 9.3*  HCT 28.6* 28.5* 30.1*  MCV 99.3 97.3 100.0  PLT 359 407* 382   Cardiac Enzymes:  Recent Labs Lab 02/16/13 1900 02/17/13 0100  TROPONINI <0.30 <0.30   CBG:  Recent Labs Lab 02/18/13 2148 02/19/13 0646 02/19/13 1109 02/19/13 1712 02/19/13 2054 02/20/13 0613  GLUCAP 99 114* 145* 110* 149* 122*   Coagulation:  Recent Labs Lab 02/16/13 1054  LABPROT 15.4*  INR 1.25    Studies/Results: Dg Chest 2 View  02/19/2013   *RADIOLOGY REPORT*  Clinical Data: Dyspnea.  Follow up pleural effusions  CHEST - 2 VIEW  Comparison: 02/18/2013  Findings: The patient is status post median sternotomy CABG procedure.  Similar appearance of cardiac enlargement.  There are bilateral pleural effusions and interstitial  edema consistent with CHF.  IMPRESSION: No change in  CHF pattern.   Original Report Authenticated By: Signa Kell, M.D.   Medications: I have reviewed the patient's current medications. Scheduled Meds: . allopurinol  100 mg Oral BID  . amiodarone  200 mg Oral Daily  . aspirin EC  325 mg Oral Daily  . darbepoetin  100 mcg Intravenous Q Fri-HD  . doxercalciferol  2 mcg Oral 3 times weekly  . enoxaparin (LOVENOX) injection  30 mg Subcutaneous Q24H  . ferric gluconate (FERRLECIT/NULECIT) IV  62.5 mg Intravenous Q Wed-HD  . gabapentin  300 mg Oral BID  . insulin aspart  0-9 Units Subcutaneous TID WC  . insulin glargine  15 Units Subcutaneous QHS  . metoprolol tartrate  12.5 mg Oral BID  . multivitamin  1 tablet Oral QHS  . pantoprazole  40 mg Oral Daily  . sevelamer  carbonate  800 mg Oral TID WC  . sodium chloride  3 mL Intravenous Q12H  . tamsulosin  0.4 mg Oral QHS   PRN Meds:.acetaminophen, diphenhydrAMINE, ibuprofen, ondansetron (ZOFRAN) IV, ondansetron, traMADol Assessment/Plan: Mr. Ackerley is a 72 y/o man with PMH of ESRD on HD (since 3/14, MWF), multivessel CAD (MI in 1992, NSTEMI in 3/14; echo in 4/14 showed EF of 50%) and right carotid stenosis s/p CABGx5/RCEA on 02/03/13, atrial fibrillation with RVR in 3/14 (not on anticoagulation), GI bleed in 3/14, COPD, DM2, HTN who presented to the ED from rehab on 7/2 (day after d/c) with likely fluid overload.  1. Dyspnea 2/2 volume overload- Pt came in more dyspneic than baseline in context of likely deconditioning due to recent surgical hospitalization.  Pt went to dialysis 7/2-7/5 and has clinical improvement today as well as improved aeration of both lungs on 7/4 CXR.  In addition, pt put out 7.8L since admission.  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   -continue metoprolol 12.5mg  po BID  -dialysis again today; Dr. Arlean Hopping from nephrology following, appreciate recs; plan to continue daily dialysis for several more days  -CT surgery now recommending left US guided thoracentesis today/tomorrow? -continue 2L O2, keep O2 sat above 92%; pt will likely need at discharge -continue incentive spirometry  -continue telemetry -PT/OT recommend pt returning to SNF ([facility])  2. Right chest wall pain, sternotomy incisional pain- ruled out ACS with troponins x3 negative given pt's history of MIs; much better now that pt is taking ibuprofen prn -continue ibuprofen prn  3. ESRD- pt has been on HD since 3/14 (usually goes MWF), does not produce any urine -pt back on MWF dialysis schedule now -nephrology on board, appreciate recs  4. Resolving leukocytosis- WBC count down from 18 on admissions to 15 on 7/3 therefore likely a stress response, no evidence of infection. Blood cx x 2  NGTD.  5. COPD- will continue to closely monitor pt's respiratory status though pt does not seem to have a COPD exacerbation at this time given lack of cough/sputum production  -continue home inhalers  6. CAD- recent CABGx5 on 02/03/13 -metoprolol 12.5mg  po BID -continue ASA  7. History of A fib with history of GI bleed- both discovered in 3/14; no anticoagulation for A fib given history of GI bleed but on amiodarone; appreciate CT surgery rec to consider discontinuing amiodarone due to potential for lung toxicity, will plan to continue this medication until colonoscopy at which time, if normal, we can begin anticoagulation for A fib and d/c amiodarone -continue amiodarone -continue pantoprazole  8.  DM2 -continue Lantus, SSI -continue gabapentin for neuropathic pain  9. HTN -metoprolol 12.5mg  po BID  10. DVT PPX- SCDs  Dispo: Disposition is deferred at this time, awaiting improvement of current medical problems.  Anticipated discharge on Monday when he can go back to SNF.   The patient does have a current PCP Daniel Nones) and does need an [facility] hospital follow-up appointment after discharge.   .Services Needed at time of discharge: Y = Yes, Blank = No PT:   OT:   RN:   Equipment:   Other:     LOS: 4 days   Rocco Serene, MD 02/20/2013, 8:08 AM

## 2013-02-21 ENCOUNTER — Inpatient Hospital Stay (HOSPITAL_COMMUNITY): Payer: Medicare Other

## 2013-02-21 ENCOUNTER — Encounter (HOSPITAL_COMMUNITY): Payer: Self-pay | Admitting: *Deleted

## 2013-02-21 LAB — RENAL FUNCTION PANEL
Albumin: 3 g/dL — ABNORMAL LOW (ref 3.5–5.2)
Calcium: 9.3 mg/dL (ref 8.4–10.5)
Creatinine, Ser: 2.95 mg/dL — ABNORMAL HIGH (ref 0.50–1.35)
GFR calc non Af Amer: 20 mL/min — ABNORMAL LOW (ref >90)

## 2013-02-21 LAB — GLUCOSE, CAPILLARY
Glucose-Capillary: 97 mg/dL (ref 70–99)
Glucose-Capillary: 95 mg/dL (ref 70–99)
Glucose-Capillary: 131 mg/dL — ABNORMAL HIGH (ref 70–99)
Glucose-Capillary: 122 mg/dL — ABNORMAL HIGH (ref 70–99)

## 2013-02-21 LAB — CBC
MCH: 31 pg (ref 26.0–34.0)
MCHC: 31.5 g/dL (ref 30.0–36.0)
MCV: 98.1 fL (ref 78.0–100.0)
Platelets: 391 10*3/uL (ref 150–400)
RDW: 17.1 % — ABNORMAL HIGH (ref 11.5–15.5)
WBC: 10.9 10*3/uL — ABNORMAL HIGH (ref 4.0–10.5)

## 2013-02-21 MED ORDER — ZOLPIDEM TARTRATE 5 MG PO TABS: 5.0000 mg | ORAL_TABLET | Freq: Every evening | ORAL | Status: DC | PRN

## 2013-02-21 MED ORDER — SODIUM CHLORIDE 0.9 % IV BOLUS (SEPSIS)
1000.0000 mL | Freq: Once | INTRAVENOUS | Status: AC
  Administered 2013-02-21: 1000 mL via INTRAVENOUS

## 2013-02-21 MED ORDER — SODIUM CHLORIDE 0.9 % IV SOLN: 62.5000 mg | INTRAVENOUS | Status: DC

## 2013-02-21 MED ORDER — SORBITOL 70 % SOLN
15.0000 mL | Freq: Once | Status: AC
  Administered 2013-02-21: 15 mL via ORAL
  Filled 2013-02-21: qty 30

## 2013-02-21 MED ORDER — ALBUMIN HUMAN 25 % IV SOLN
INTRAVENOUS | Status: AC
  Filled 2013-02-21: qty 100

## 2013-02-21 MED ORDER — SENNOSIDES-DOCUSATE SODIUM 8.6-50 MG PO TABS
1.0000 | ORAL_TABLET | Freq: Two times a day (BID) | ORAL | Status: DC
  Administered 2013-02-21 – 2013-02-22 (×2): 1 via ORAL
  Filled 2013-02-21 (×3): qty 1

## 2013-02-21 MED ORDER — ALBUMIN HUMAN 25 % IV SOLN
25.0000 g | Freq: Once | INTRAVENOUS | Status: AC
  Administered 2013-02-21: 25 g via INTRAVENOUS

## 2013-02-21 NOTE — Progress Notes (Signed)
Subjective: Mr. Ballantine is breathing well this morning. He had dialysis both days over the weekend and is back today per nephrology.  His right sided pain is also improved but does continue to bother him when he coughs, he is taking ibuprofen prn.   Objective: Vital signs in last 24 hours: Filed Vitals:   02/21/13 1000 02/21/13 1030 02/21/13 1100 02/21/13 1118  BP: 104/88 70/41 105/68 114/69  Pulse: 93 111 106 105  Temp:    96.9 F (36.1 C)  TempSrc:    Oral  Resp: 18 17 17 18   Height:      Weight:    160 lb 15 oz (73 kg)  SpO2:    100%   Weight change: -11 lb 3.9 oz (-5.1 kg)  Intake/Output Summary (Last 24 hours) at 02/21/13 1132 Last data filed at 02/21/13 1118  Gross per 24 hour  Intake    720 ml  Output   3967 ml  Net  -3247 ml   PEX General: alert, cooperative, and in no apparent distress HEENT: vision grossly intact, oropharynx clear and non-erythematous  Neck: well healing scar on right neck; supple, no lymphadenopathy, JVD, or carotid bruits Chest: healing midline sternotomy scar Lungs: much less labored breathing; soft rales in bilateral lower lung fields; no wheezes,ronchi Heart: distant heart sounds, regular rate and rhythm, no murmurs, gallops, or rubs Abdomen: soft, non-tender, non-distended, normal bowel sounds Extremities: trace non-pitting edema, scars on bilateral medial lower legs, scattered bruises; large  olecranon bursitis on right elbow; 2+ DP/PT pulses bilaterally, no cyanosis or clubbing Neurologic: alert & oriented X3, cranial nerves II-XII intact, strength grossly intact, sensation intact to light touch  Lab Results: Basic Metabolic Panel:  Recent Labs Lab 02/20/13 0615 02/21/13 0827  NA 135 136  K 4.3 3.7  CL 97 96  CO2 27 28  GLUCOSE 115* 129*  BUN 20 19  CREATININE 3.20* 2.95*  CALCIUM 9.2 9.3  PHOS 2.3 2.0*   Liver Function Tests:  Recent Labs Lab 02/16/13 1054 02/20/13 0615 02/21/13 0827  AST 19  --   --   ALT 14  --    --   ALKPHOS 177*  --   --   BILITOT 0.5  --   --   PROT 6.4  --   --   ALBUMIN 2.6* 2.9* 3.0*   CBC:  Recent Labs Lab 02/16/13 1054 02/17/13 0100 02/20/13 0615 02/21/13 0827  WBC 18.2* 15.6* 9.7 10.9*  NEUTROABS 15.9* 13.3*  --   --   HGB 8.8* 8.9* 9.3* 10.0*  HCT 28.6* 28.5* 30.1* 31.7*  MCV 99.3 97.3 100.0 98.1  PLT 359 407* 382 391   Cardiac Enzymes:  Recent Labs Lab 02/16/13 1900 02/17/13 0100  TROPONINI <0.30 <0.30   CBG:  Recent Labs Lab 02/19/13 2054 02/20/13 0613 02/20/13 1122 02/20/13 1611 02/20/13 2225 02/21/13 0557  GLUCAP 149* 122* 124* 115* 97 95   Coagulation:  Recent Labs Lab 02/16/13 1054  LABPROT 15.4*  INR 1.25    Studies/Results: Dg Chest 2 View  02/21/2013   *RADIOLOGY REPORT*  Clinical Data: Shortness of breath  CHEST - 2 VIEW  Comparison: 02/19/2013  Findings: AP and lateral views of the chest again show moderate left pleural effusion with small right pleural effusion. Stable to slight improvement in edema pattern. The cardiopericardial silhouette is enlarged. Interstitial markings are diffusely coarsened with chronic features.  Persistent basilar atelectasis/infiltrate noted.  The patient is status post CABG. Bones are diffusely demineralized.  Degenerative changes noted in both shoulders.  IMPRESSION: Slight improvement in pulmonary edema pattern.  Cardiomegaly with vascular congestion and left greater than right pleural effusions.   Original Report Authenticated By: Kennith Center, M.D.   Medications: I have reviewed the patient's current medications. Scheduled Meds: . allopurinol  100 mg Oral BID  . amiodarone  200 mg Oral Daily  . aspirin EC  325 mg Oral Daily  . darbepoetin  100 mcg Intravenous Q Fri-HD  . doxercalciferol  2 mcg Oral 3 times weekly  . enoxaparin (LOVENOX) injection  30 mg Subcutaneous Q24H  . ferric gluconate (FERRLECIT/NULECIT) IV  62.5 mg Intravenous Q Wed-HD  . gabapentin  300 mg Oral BID  . insulin aspart   0-9 Units Subcutaneous TID WC  . insulin glargine  15 Units Subcutaneous QHS  . metoprolol tartrate  12.5 mg Oral BID  . multivitamin  1 tablet Oral QHS  . pantoprazole  40 mg Oral Daily  . sevelamer carbonate  800 mg Oral TID WC  . sodium chloride  3 mL Intravenous Q12H  . tamsulosin  0.4 mg Oral QHS   PRN Meds:.acetaminophen, diphenhydrAMINE, ibuprofen, ondansetron (ZOFRAN) IV, ondansetron, traMADol Assessment/Plan: Mr. Rabine is a 72 y/o man with PMH of ESRD on HD (since 3/14, MWF), multivessel CAD (MI in 1992, NSTEMI in 3/14; echo in 4/14 showed EF of 50%) and right carotid stenosis s/p CABGx5/RCEA on 02/03/13, atrial fibrillation with RVR in 3/14 (not on anticoagulation), GI bleed in 3/14, COPD, DM2, HTN who presented to the ED from rehab on 7/2 (day after d/c) with fluid overload.  1. Dyspnea 2/2 volume overload- Pt came in more dyspneic than baseline in context of likely deconditioning due to recent surgical hospitalization.  Pt went to dialysis 7/2-7/7 and has clinical improvement as well as improved aeration of both lungs on follow-up CXRs.  In addition, pt put out ~11L since admission.  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  CT surgery was considering ultrasound guided left thoracentesis, which we considered; however, potential risks seem to outweigh potential benefits since we have a likely cause of the effusion (volume overload), pt has not had fevers, has paralyzed left hemidiaphragm, and has had good clinical response to dialysis.  -continue metoprolol 12.5mg  po BID  -dialysis again today; Dr. Arlean Hopping from nephrology following, appreciate recs; plan to continue daily dialysis through tomorrow then back to usual MWF schedule -continue 2L O2, keep O2 sat above 92%; pt will likely need at discharge -continue incentive spirometry  -continue telemetry -PT/OT recommend SNF, pt amenable  2. Right chest wall pain, sternotomy incisional pain- ruled out ACS with  troponins x3 negative given pt's history of MIs; much better now that pt is taking ibuprofen prn. -continue ibuprofen prn  3. ESRD- pt has been on HD since 3/14 (usually goes MWF), does not produce any urine -pt to dialysis tomorrow, then back on usual MWF dialysis schedule per nephrology recs  4. Resolving leukocytosis- WBC count down from 18 on admission now down to 10.9 therefore likely a stress response, no evidence of infection. Blood cx x 2 NGTD.  5. COPD- will continue to closely monitor pt's respiratory status though pt does not seem to have a COPD exacerbation at this time given lack of cough/sputum production  -continue home inhalers  6. CAD- recent CABGx5 on 02/03/13 -metoprolol 12.5mg  po BID -continue ASA  7. History of A fib with history of GI bleed- both discovered in 3/14; no anticoagulation for A fib  given history of GI bleed but on amiodarone; appreciate CT surgery rec to consider discontinuing amiodarone due to potential for lung toxicity, will plan to continue this medication until colonoscopy at which time, if normal, we can begin anticoagulation for A fib and d/c amiodarone. -continue amiodarone -continue pantoprazole  8. DM2 -continue Lantus, SSI -continue gabapentin for neuropathic pain  9. HTN -metoprolol 12.5mg  po BID  10. DVT PPX- SCDs  Dispo: Disposition today to SNF.   The patient does have a current PCP Daniel Nones) and does need an [facility] hospital follow-up appointment after discharge.   .Services Needed at time of discharge: Y = Yes, Blank = No PT:   OT:   RN:   Equipment:   Other:     LOS: 5 days   Rocco Serene, MD 02/21/2013, 11:32 AM

## 2013-02-21 NOTE — Progress Notes (Signed)
Internal Medicine Attending  Date: 02/21/2013  Patient name: Nicholas [name] Medical record number: [id number] Date of birth: 02/05/1941 Age: 72 y.o. Gender: male  I saw and evaluated the patient. I reviewed the resident's note by Dr. Aundria Rud and I agree with the resident's findings and plans as documented in discharge summary.  Examination this AM revealed a WDWN man lying at 20 degrees in a hospital bed undergoing dialysis.  Lungs were clear anteriorly and chest wall was non-tender to palpation.  [name] dyspnea markedly improved with daily dialysis and 11 liters of volume off.  Volume removal was somewhat hindered by hypotension and the metoprolol can be stopped in the future should blood pressure remain an obstacle to adequate volume control.  Any residual dyspnea may also be secondary to the left hemidiaphragm paralysis.  Although it appears large, it is felt the left pleural effusion is not significantly contributing to his dyspnea because it is not as large as it appears radiographically,in the setting of an elevated (and paralyzed) left hemidiaphragm.  Therefore, a left sided thoracentesis was not felt to be necessary.  The key to avoid readmission will be to maintain adequate volume removal during his dialysis sessions.  I agree with transfer back to his SNF this afternoon for further rehab.

## 2013-02-21 NOTE — Discharge Summary (Signed)
Please add the following to the discharge diagnoses:  Left hemidiaphragm paralysis  Nicholas [name] dyspnea markedly improved with daily dialysis and 11 liters of volume off.  Volume removal was somewhat hindered by hypotension and the metoprolol can be stopped in the future should blood pressure remain an obstacle to adequate volume control.  Any residual dyspnea may also be secondary to the left hemidiaphragm paralysis.  Although it appears large, it is felt the left pleural effusion is not significantly contributing to his dyspnea because it is not as large as it appears radiographically,in the setting of an elevated (and paralyzed) left hemidiaphragm.  Therefore, a left sided thoracentesis was not felt to be necessary.  The key to avoid readmission will be to maintain adequate volume removal during his dialysis sessions.

## 2013-02-21 NOTE — Procedures (Signed)
Currently tolerating HD without hemodynamic challenges except for persistently low BP. He still c/o SOB but better. Weight down. Lung exam crackles on right, decreased BS on left. Ext no significant edema. Was 82.1 kg on 7/2 and 71.53 kg this morning! CXR shows improvement in edema and effusions but the left effusion remains very large.  Assessment  1 SOB / pulm edema/vol excess / pleural effusions- improving 2 CKD, usual HD MWF  3 Anemia of CKD,hgb 8.8>8.9 on aranesp150mcg and weekly IV Fe  4 Sec HPT, cont vit D and binders, ca/ phos stable  5 DM- on insulin  6 Gout, on allopurinol  7 Afib, on amio, ASA, BB; no AC due to GIB in Mar  8 s/p CABG and CEA 02/03/13  P- extra HD Tuesday again for volume, still needs a few more days of daily HD as he won't tolerate large amts of UF per treatment.  I think he still would benefit from L  Thoracentesis.  Kamorie Aldous C

## 2013-02-21 NOTE — Progress Notes (Signed)
Pt transported to Radiology for 2 view chest - went straight to dialysis from radiology @ [phone]

## 2013-02-21 NOTE — Progress Notes (Signed)
Called report to [facility]. Spoke to [employer]

## 2013-02-21 NOTE — Progress Notes (Signed)
PT Cancel Note:   Pt in HD.  Will attempt back later today if time allows.     Verdell Face, [place] [phone] 02/21/2013

## 2013-02-21 NOTE — Progress Notes (Signed)
Patient was scheduled for d/c today back to [facility]. He will received dialysis today and will need another treatment tomorrow, then Weds and Friday. CSW was unable to ascertain what time he would need to be at dialysis tomorrow as this is not his normal day. Due to the lateness of the day- the SNF was unable to accept him this evening. Thus- he will receive a dialysis treatment early tomorrow morning, then will d/c back to [facility]. Notified Dr. Aundria Rud,  patient, his nurse and facility. Patient called his brother; patient is disappointed that he is not leaving today but verbalized understanding of above issues.   Lorri Frederick. [place]  [phone]

## 2013-02-21 NOTE — Progress Notes (Signed)
Discharge cancelled. Per Child psychotherapist and MD. Pt will have dialysis and than discharge to [facility] after lunch tomorrow called back [facility] Morrie Sheldon RN to notify.

## 2013-02-21 NOTE — Progress Notes (Signed)
Call to MD made aware that pt has not had a BM since 7/2. Orders received. Meds ordered for later.

## 2013-02-21 NOTE — Progress Notes (Signed)
       [address]       [name] [address]             [phone]               Subjective: Pt on HD.  No new issues.   Objective: Vital signs in last 24 hours: Patient Vitals for the past 24 hrs:  BP Temp Temp src Pulse Resp SpO2 Weight  02/21/13 0830 103/69 mmHg - - 97 17 - -  02/21/13 0800 97/66 mmHg - - 92 18 - -  02/21/13 0758 95/63 mmHg - - 92 16 - -  02/21/13 0750 94/60 mmHg 97 F (36.1 C) Oral 94 21 98 % 163 lb 12.8 oz (74.3 kg)  02/21/13 0730 98/84 mmHg 97.6 F (36.4 C) Oral 95 18 95 % 157 lb 11.2 oz (71.532 kg)  02/20/13 2226 87/64 mmHg 98.4 F (36.9 C) Oral 99 18 93 % -  02/20/13 1814 95/63 mmHg - - 100 16 100 % -  02/20/13 1530 - - - - - - 153 lb 14.1 oz (69.8 kg)  02/20/13 1500 87/58 mmHg - - 96 16 - -  02/20/13 1430 94/76 mmHg - - 96 16 - -  02/20/13 1357 98/61 mmHg - - 90 13 - -  02/20/13 1334 88/57 mmHg - - 84 12 - -  02/20/13 1318 78/54 mmHg - - 88 14 - -  02/20/13 1258 132/91 mmHg - - 85 14 - -  02/20/13 1230 96/55 mmHg - - 84 11 - -  02/20/13 1200 98/62 mmHg - - 81 12 - -  02/20/13 1134 106/61 mmHg 97.7 F (36.5 C) Oral 80 14 96 % 158 lb 15.2 oz (72.1 kg)   Current Weight  02/21/13 163 lb 12.8 oz (74.3 kg)     Intake/Output from previous day: 07/06 0701 - 07/07 0700 In: 840 [P.O.:840] Out: 2153     PHYSICAL EXAM:  Heart: RRR Lungs: Decreased BS on L Wound: Clean and dry Extremities: +Edema    Lab Results: CBC: Recent Labs  02/20/13 0615 02/21/13 0827  WBC 9.7 10.9*  HGB 9.3* 10.0*  HCT 30.1* 31.7*  PLT 382 391   BMET:  Recent Labs  02/20/13 0615  NA 135  K 4.3  CL 97  CO2 27  GLUCOSE 115*  BUN 20  CREATININE 3.20*  CALCIUM 9.2    PT/INR: No results found for this basename: LABPROT, INR,  in the last 72 hours  CXR: Findings: AP and lateral views of the chest again show moderate left pleural effusion with small right pleural effusion. Stable to slight improvement in edema pattern. The  cardiopericardial silhouette is enlarged. Interstitial markings are diffusely coarsened with chronic features. Persistent basilar atelectasis/infiltrate noted. The patient is status post CABG. Bones are diffusely demineralized. Degenerative changes noted in both shoulders.  IMPRESSION:  Slight improvement in pulmonary edema pattern.Cardiomegaly with vascular congestion and left greater than right pleural effusions   Assessment/Plan: Stable from cardiac surgery standpoint.  Main issues at this point are related to his volume overload.  Continue management per renal/IM.   Probably would benefit from ultrasound guided thoracentesis for left effusion.    LOS: 5 days    Dinger,Alexsia Klindt H 02/21/2013

## 2013-02-22 ENCOUNTER — Ambulatory Visit: Payer: Medicare Other | Admitting: Vascular Surgery

## 2013-02-22 DIAGNOSIS — J9600 Acute respiratory failure, unspecified whether with hypoxia or hypercapnia: Secondary | ICD-10-CM

## 2013-02-22 LAB — GLUCOSE, CAPILLARY

## 2013-02-22 MED ORDER — SENNOSIDES-DOCUSATE SODIUM 8.6-50 MG PO TABS: 1.0000 | ORAL_TABLET | Freq: Every day | ORAL | Status: DC | PRN

## 2013-02-22 MED ORDER — NEPRO/CARBSTEADY PO LIQD
237.0000 mL | ORAL | Status: DC | PRN
  Filled 2013-02-22: qty 237

## 2013-02-22 MED ORDER — HEPARIN SODIUM (PORCINE) 1000 UNIT/ML DIALYSIS: 2000.0000 [IU] | INTRAMUSCULAR | Status: DC | PRN

## 2013-02-22 MED ORDER — LIDOCAINE-PRILOCAINE 2.5-2.5 % EX CREA
1.0000 "application " | TOPICAL_CREAM | CUTANEOUS | Status: DC | PRN
  Filled 2013-02-22: qty 5

## 2013-02-22 MED ORDER — POLYETHYLENE GLYCOL 3350 17 G PO PACK
17.0000 g | PACK | Freq: Once | ORAL | Status: DC
  Filled 2013-02-22: qty 1

## 2013-02-22 MED ORDER — PENTAFLUOROPROP-TETRAFLUOROETH EX AERO: 1.0000 "application " | INHALATION_SPRAY | CUTANEOUS | Status: DC | PRN

## 2013-02-22 MED ORDER — LIDOCAINE HCL (PF) 1 % IJ SOLN: 5.0000 mL | INTRAMUSCULAR | Status: DC | PRN

## 2013-02-22 MED ORDER — SODIUM CHLORIDE 0.9 % IV SOLN: 100.0000 mL | INTRAVENOUS | Status: DC | PRN

## 2013-02-22 MED ORDER — ALTEPLASE 2 MG IJ SOLR: 2.0000 mg | Freq: Once | INTRAMUSCULAR | Status: DC | PRN

## 2013-02-22 MED ORDER — HEPARIN SODIUM (PORCINE) 1000 UNIT/ML DIALYSIS
1000.0000 [IU] | INTRAMUSCULAR | Status: DC | PRN
  Filled 2013-02-22: qty 1

## 2013-02-22 NOTE — Progress Notes (Signed)
Internal Medicine Attending  Date: 02/22/2013  Patient name: Nicholas [name] Medical record number: [id number] Date of birth: [birth year] Age: 72 y.o. Gender: male  I saw and evaluated the patient. I reviewed the resident's note by Dr. Aundria Rud and I agree with the resident's findings and plans as documented in her progress note.  [name] had a brief episode of transient symptomatic hypotension last night which resolved with IV hydration and discontinuing the metoprolol.  He is currently asymptomatic.  Clinically he is at a euvolemic state and ready for discharge to SNF with resumption of dialysis on MWF schedule.  Metoprolol has been permanently discontinued.

## 2013-02-22 NOTE — Progress Notes (Signed)
IV d/c'd.  Tele d/c'd.  Pt d/c'd to SNF.  Home meds and d/c instructions have been reviewed with pt.  Pt denies any questions or concerns at this time.  Pt leaving unit via ambulance and appears in no acute distress.  Report has been given to receiving nurse at facility.  Nurse denies any questions or concerns at this time as well.   Nino Glow RN

## 2013-02-22 NOTE — Progress Notes (Signed)
Subjective: Overnight, [name]' BP dropped to as low as 62 systolic with associated lightheadedness and nausea.  Metoprolol was d/c'ed and IVF were administered, symptoms improved after fluids.  BP improved to 92/56 this morning.   Nicholas [name] continues to breathe well this morning without oxygen.  He has a mild headache for which he took some acetominophen.  He has not had a bowel movement in several days, but we will try a dose of Miralax this morning.    Objective: Vital signs in last 24 hours: Filed Vitals:   02/22/13 0137 02/22/13 0138 02/22/13 0312 02/22/13 0615  BP: 78/38 71/44 81/50  92/56  Pulse: 85  86 88  Temp:    98.1 F (36.7 C)  TempSrc:    Oral  Resp: 18   18  Height:      Weight:    163 lb (73.936 kg)  SpO2: 95%  93% 94%   Weight change: -1 lb 4 oz (-0.568 kg)  Intake/Output Summary (Last 24 hours) at 02/22/13 0742 Last data filed at 02/21/13 1700  Gross per 24 hour  Intake    180 ml  Output   1814 ml  Net  -1634 ml   PEX General: alert, cooperative, and in no apparent distress HEENT: vision grossly intact, oropharynx clear and non-erythematous  Neck: well healing scar on right neck; supple, no lymphadenopathy, JVD, or carotid bruits Chest: healing midline sternotomy scar Lungs: npn-labored breathing; decreased lung sounds in left lower lung fields; no wheezes,ronchi Heart: distant heart sounds, regular rate and rhythm, no murmurs, gallops, or rubs Abdomen: soft, non-tender, non-distended, normal bowel sounds Extremities: trace non-pitting edema, scars on bilateral medial lower legs, scattered bruises; large olecranon bursitis on right elbow; 2+ DP/PT pulses bilaterally, no cyanosis or clubbing Neurologic: alert & oriented X3, cranial nerves II-XII intact, strength grossly intact, sensation intact to light touch  Lab Results: Basic Metabolic Panel:  Recent Labs Lab 02/20/13 0615 02/21/13 0827  NA 135 136  K 4.3 3.7  CL 97 96  CO2 27 28    GLUCOSE 115* 129*  BUN 20 19  CREATININE 3.20* 2.95*  CALCIUM 9.2 9.3  PHOS 2.3 2.0*   Liver Function Tests:  Recent Labs Lab 02/16/13 1054 02/20/13 0615 02/21/13 0827  AST 19  --   --   ALT 14  --   --   ALKPHOS 177*  --   --   BILITOT 0.5  --   --   PROT 6.4  --   --   ALBUMIN 2.6* 2.9* 3.0*   CBC:  Recent Labs Lab 02/16/13 1054 02/17/13 0100 02/20/13 0615 02/21/13 0827  WBC 18.2* 15.6* 9.7 10.9*  NEUTROABS 15.9* 13.3*  --   --   HGB 8.8* 8.9* 9.3* 10.0*  HCT 28.6* 28.5* 30.1* 31.7*  MCV 99.3 97.3 100.0 98.1  PLT 359 407* 382 391   Cardiac Enzymes:  Recent Labs Lab 02/16/13 1900 02/17/13 0100  TROPONINI <0.30 <0.30   CBG:  Recent Labs Lab 02/20/13 2225 02/21/13 0557 02/21/13 1209 02/21/13 1642 02/21/13 2102 02/22/13 0609  GLUCAP 97 95 131* 137* 122* 117*   Coagulation:  Recent Labs Lab 02/16/13 1054  LABPROT 15.4*  INR 1.25    Studies/Results: Dg Chest 2 View  02/21/2013   *RADIOLOGY REPORT*  Clinical Data: Shortness of breath  CHEST - 2 VIEW  Comparison: 02/19/2013  Findings: AP and lateral views of the chest again show moderate left pleural effusion with small right pleural effusion. Stable  to slight improvement in edema pattern. The cardiopericardial silhouette is enlarged. Interstitial markings are diffusely coarsened with chronic features.  Persistent basilar atelectasis/infiltrate noted.  The patient is status post CABG. Bones are diffusely demineralized.  Degenerative changes noted in both shoulders.  IMPRESSION: Slight improvement in pulmonary edema pattern.  Cardiomegaly with vascular congestion and left greater than right pleural effusions.   Original Report Authenticated By: Kennith Center, M.D.   Medications: I have reviewed the patient's current medications. Scheduled Meds: . allopurinol  100 mg Oral BID  . amiodarone  200 mg Oral Daily  . aspirin EC  325 mg Oral Daily  . darbepoetin  100 mcg Intravenous Q Fri-HD  .  doxercalciferol  2 mcg Oral 3 times weekly  . enoxaparin (LOVENOX) injection  30 mg Subcutaneous Q24H  . ferric gluconate (FERRLECIT/NULECIT) IV  62.5 mg Intravenous Q Wed-HD  . gabapentin  300 mg Oral BID  . insulin aspart  0-9 Units Subcutaneous TID WC  . insulin glargine  15 Units Subcutaneous QHS  . multivitamin  1 tablet Oral QHS  . pantoprazole  40 mg Oral Daily  . senna-docusate  1 tablet Oral BID  . sevelamer carbonate  800 mg Oral TID WC  . sodium chloride  3 mL Intravenous Q12H  . tamsulosin  0.4 mg Oral QHS   PRN Meds:.acetaminophen, diphenhydrAMINE, ibuprofen, ondansetron (ZOFRAN) IV, ondansetron, traMADol Assessment/Plan: Nicholas [name] is a 72 y/o man with PMH of ESRD on HD (since 3/14, MWF), multivessel CAD (MI in 1992, NSTEMI in 3/14; echo in 4/14 showed EF of 50%) and right carotid stenosis s/p CABGx5/RCEA on 02/03/13, atrial fibrillation with RVR in 3/14 (not on anticoagulation), GI bleed in 3/14, COPD, DM2, HTN who presented to the ED from rehab on 7/2 (day after d/c) with fluid overload.  1. Dyspnea 2/2 volume overload- Pt came in more dyspneic than baseline in context of likely deconditioning due to recent surgical hospitalization.  Pt went to dialysis 7/2-7/7 and has clinical improvement as well as improved aeration of both lungs on follow-up CXRs.  In addition, pt put out ~11L since admission.  Pt appears euvolemic clinically today.  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  CT surgery was considering ultrasound guided left thoracentesis, which we considered; however, potential risks seem to outweigh potential benefits since we have a likely cause of the effusion (volume overload), pt has not had fevers, has paralyzed left hemidiaphragm, and has had good clinical response to dialysis.  Considering pt's drop in BP (down as low as 62 systolic) last night, will d/c metoprolol.  -discontinue metoprolol -dialysis again today; Dr. Arlean Hopping from nephrology following,  appreciate recs; no dialysis today, back to usual MWF schedule -continue incentive spirometry  -continue telemetry -PT/OT recommend SNF, pt amenable  2. Right chest wall pain, sternotomy incisional pain- ruled out ACS with troponins x3 negative given pt's history of MIs; much better now that pt is taking ibuprofen prn. -continue ibuprofen prn  3. ESRD- pt has been on HD since 3/14 (usually goes MWF), does not produce any urine -pt back on usual MWF dialysis schedule per nephrology recs  4. Resolving leukocytosis- WBC count down from 18 on admission now down to 10.9 therefore likely a stress response, no evidence of infection. Blood cx x 2 negative.  5. COPD- will continue to closely monitor pt's respiratory status though pt does not seem to have a COPD exacerbation at this time given lack of cough/sputum production  -continue home inhalers  6.  CAD- recent CABGx5 on 02/03/13 -holding metoprolol per #1 -continue ASA  7. History of A fib with history of GI bleed- both discovered in 3/14; no anticoagulation for A fib given history of GI bleed but on amiodarone; appreciate CT surgery rec to consider discontinuing amiodarone due to potential for lung toxicity, will plan to continue this medication until colonoscopy at which time, if normal, we can begin anticoagulation for A fib and d/c amiodarone. -continue amiodarone -continue pantoprazole  8. DM2 -continue Lantus, SSI -continue gabapentin for neuropathic pain  9. HTN -holding metoprolol per #1   10. DVT PPX- SCDs  Dispo: Disposition today to SNF ([facility]) today.  Only change to discharge summary writeen yesterday is summary of hypotensive episode last night, discontinuation of metoprolol.   The patient does have a current PCP Nicholas [name]) and does need an [facility] hospital follow-up appointment after discharge.   .Services Needed at time of discharge: Y = Yes, Blank = No PT:   OT:   RN:   Equipment:   Other:      LOS: 6 days   Rocco Serene, MD 02/22/2013, 7:42 AM

## 2013-02-23 LAB — CULTURE, BLOOD (ROUTINE X 2): Culture: NO GROWTH

## 2013-02-24 ENCOUNTER — Other Ambulatory Visit: Payer: Self-pay | Admitting: *Deleted

## 2013-02-24 DIAGNOSIS — I2510 Atherosclerotic heart disease of native coronary artery without angina pectoris: Secondary | ICD-10-CM

## 2013-03-01 ENCOUNTER — Ambulatory Visit (INDEPENDENT_AMBULATORY_CARE_PROVIDER_SITE_OTHER): Payer: Self-pay | Admitting: Thoracic Surgery (Cardiothoracic Vascular Surgery)

## 2013-03-01 ENCOUNTER — Encounter: Payer: Self-pay | Admitting: Thoracic Surgery (Cardiothoracic Vascular Surgery)

## 2013-03-01 ENCOUNTER — Ambulatory Visit
Admission: RE | Admit: 2013-03-01 | Discharge: 2013-03-01 | Disposition: A | Discharge status: Home / Self Care | Payer: Medicare Other | Source: Ambulatory Visit | Attending: Thoracic Surgery (Cardiothoracic Vascular Surgery) | Admitting: Thoracic Surgery (Cardiothoracic Vascular Surgery)

## 2013-03-01 VITALS — BP 109/65 | HR 90 | Resp 16 | Ht 66.0 in | Wt 169.0 lb

## 2013-03-01 DIAGNOSIS — I779 Disorder of arteries and arterioles, unspecified: Secondary | ICD-10-CM

## 2013-03-01 DIAGNOSIS — Z951 Presence of aortocoronary bypass graft: Secondary | ICD-10-CM

## 2013-03-01 DIAGNOSIS — I2510 Atherosclerotic heart disease of native coronary artery without angina pectoris: Secondary | ICD-10-CM

## 2013-03-01 NOTE — Progress Notes (Signed)
HPI:  [name] returns today for a scheduled postoperative followup visit.  [name] is a 72 year old gentleman with end-stage renal disease and diabetes. He also is a history of coronary disease, carotid disease, paroxysmal atrial fibrillation, ischemic cardiomyopathy, and congestive heart failure. He underwent a combined coronary bypass grafting x5 as well as a right carotid endarterectomy (Dr. Hart Rochester) on June 19. He was discharged to [facility] nursing facility. He was readmitted with shortness of breath and found to have severe volume overloaded. Chest x-ray also showed a left pleural effusion and elevated left hemidiaphragm. He was dialyzed aggressively over the next few days and 11 liters of fluid were taken off. His breathing improved dramatically.  He has been working with physical therapy at [facility] and says that is going well.  He complains of pain about an inch to the right of this incision in the midsternal area. He also complains of a burning sensation and numbness in his left calf just below the incision. This is worse at night. He is anxious to begin driving and very anxious to return home.  Past Medical History  Diagnosis Date  . Coronary artery disease     a. hx of MI 1992; b. 10/2012 NSTEMI (no cath 2/2 GIB);  c. 11/2012 MV: Inf defect w inf and septal HK (favor prior MI), EF 47%;  d. 12/2012 Cath: Severe 3vd, EF 45-50%;  e. 01/2013 CABG x 5 (LIMA->LAD, VG->D1, VG->OM1->OM2, VG->PDA)  . Diabetes mellitus   . Hypertension   . Arthritis   . Carotid artery disease     a. 01/2013 doppler: >80% RICA stenosis, <39% LICA stenosis;  b. 01/2013 R CEA.  . Gastritis 2012 and 09/2011    treated for H Pylori in 12/2011  . Diabetic neuropathy   . Osteoporosis   . COPD (chronic obstructive pulmonary disease)   . Anxiety   . Depression   . ESRD (end stage renal disease)     M-W-Sa  [name]  . Diabetic nephropathy   . GERD (gastroesophageal reflux disease)   . Neuromuscular  disorder     peripheral neurotherapy  . Anemia   . A-fib     a. Dx 10/2012, on amio, no anticoagulation 2/2 GIB 10/2012.  [name] GIB (gastrointestinal bleeding)     a. 10/2012, awaiting colonoscopy.      Current Outpatient Prescriptions  Medication Sig Dispense Refill  . acetaminophen (TYLENOL) 500 MG tablet Take 500 mg by mouth every 4 (four) hours as needed for pain.      [name] allopurinol (ZYLOPRIM) 100 MG tablet Take 100 mg by mouth 2 (two) times daily.       [name] amiodarone (PACERONE) 200 MG tablet Take 200 mg by mouth daily.      [name] aspirin EC 325 MG EC tablet Take 1 tablet (325 mg total) by mouth daily.  30 tablet  0  . fish oil-omega-3 fatty acids 1000 MG capsule Take 1 g by mouth 2 (two) times daily.       [name] gabapentin (NEURONTIN) 300 MG capsule Take 1 capsule (300 mg total) by mouth 2 (two) times daily.      . insulin aspart (NOVOLOG) 100 UNIT/ML injection Inject 0-8 Units into the skin 3 (three) times daily before meals. 0-99= no insulin; 100-150= 2 units; 151-200=4 units; 201-250=6 units; greater than 250= 8 units.      . insulin glargine (LANTUS) 100 UNIT/ML injection Inject 15 Units into the skin at bedtime.      [name]  lidocaine-prilocaine (EMLA) cream Apply 1 application topically every Monday, Wednesday, and Friday.       . Multiple Vitamins-Minerals (MULTIVITAMIN WITH MINERALS) tablet Take 1 tablet by mouth at bedtime.       . multivitamin (RENA-VIT) TABS tablet Take 1 tablet by mouth at bedtime.       . pantoprazole (PROTONIX) 40 MG tablet Take 40 mg by mouth daily.      [name] senna-docusate (SENOKOT-S) 8.6-50 MG per tablet Take 1 tablet by mouth daily as needed for constipation.      . sevelamer carbonate (RENVELA) 800 MG tablet Take 800 mg by mouth 3 (three) times daily with meals.      . sodium chloride 0.9 % SOLN 100 mL with ferric gluconate 12.5 MG/ML SOLN 62.5 mg Inject 62.5 mg into the vein every Wednesday with hemodialysis.      [name] tamsulosin (FLOMAX) 0.4 MG CAPS Take 0.4 mg by mouth at  bedtime.      . traMADol (ULTRAM) 50 MG tablet Take 50 mg by mouth every 8 (eight) hours as needed for pain.      [name] zolpidem (AMBIEN) 5 MG tablet Take 1 tablet (5 mg total) by mouth at bedtime as needed for sleep.  30 tablet  0  . darbepoetin (ARANESP) 100 MCG/0.5ML SOLN Inject 0.5 mLs (100 mcg total) into the vein every Friday with hemodialysis.  4.2 mL    . metoprolol succinate (TOPROL-XL) 50 MG 24 hr tablet       . traMADol-acetaminophen (ULTRACET) 37.5-325 MG per tablet        No current facility-administered medications for this visit.    Physical Exam BP 109/65  Pulse 90  Resp 16  Ht 5\' 6"  (1.676 m)  Wt 169 lb (76.658 kg)  BMI 27.29 kg/m2  SpO33 100% 72 year old male in no acute distress Neurologic alert and oriented x3 Cardiac regular rate and rhythm, normal S1 and S2 Lungs diminished breath sounds left base Sternal incision healing with minimal eschar formation, sternum stable Leg incisions with eschar, erythema at left leg incision  Diagnostic Tests: Chest x-ray 03/01/2013 CHEST - 2 VIEW  Comparison: Chest x-ray of 02/21/2013  Findings: Aeration has improved somewhat. Pleural effusions  bilaterally have diminished in volume. Persistent slight elevation  of the left hemidiaphragm is again noted. Cardiomegaly is stable.  There are degenerative changes noted in both shoulders.  IMPRESSION:  Slightly better aeration with some decrease in volume of small  effusions. Persistent elevation of the left hemidiaphragm.  Original Report Authenticated By: Dwyane Dee, M.D.  Impression: 72 year old gentleman with multiple medical problems including end-stage renal disease requiring hemodialysis. He underwent combined coronary bypass grafting x5 in right carotid endarterectomy on June 19. He was readmitted with volume overload. He has improved dramatically since he was aggressively dialyzed during a second hospitalization.  He does have some typical pains following surgery. Likely has  some costochondritis on the right costal margin. He does have some pain likely due to irritation of the saphenous nerve and the left leg. He also has an elevated left hemidiaphragm. Hopefully this is just due to a transient phrenic nerve dysfunction from slush rather than transection of the nerve with the mammary harvest. There really is no way to tell at this point. 90% of these will improve over time.  He is very anxious to return home to think is quite ready to start driving at this point, and he has no other means of getting dialysis. I told him he can  begin driving a limited basis in 2 weeks. If that goes well then he may be a little to home at that time.  Is not to lift anything over 10 pounds for another 2 weeks.  I suspect the erythema at the left leg incision is primarily local irritation, but if it would do any harm to apply Neosporin to the wound as needed.   Plan:  Return in one month with chest x-ray.

## 2013-03-01 NOTE — Patient Instructions (Signed)
May begin driving on a limited basis in 2 weeks  Do not lift anything over 10 pounds for another 2 weeks  Return in one month- we will repeat a chest x-ray at that time

## 2013-03-03 ENCOUNTER — Encounter: Payer: Self-pay | Admitting: Cardiovascular Disease

## 2013-03-03 ENCOUNTER — Ambulatory Visit (INDEPENDENT_AMBULATORY_CARE_PROVIDER_SITE_OTHER): Payer: Medicare Other | Admitting: Cardiovascular Disease

## 2013-03-03 VITALS — BP 118/64 | HR 84 | Ht 66.0 in | Wt 169.0 lb

## 2013-03-03 DIAGNOSIS — Z951 Presence of aortocoronary bypass graft: Secondary | ICD-10-CM

## 2013-03-03 MED ORDER — METOPROLOL TARTRATE 25 MG PO TABS: 25.0000 mg | ORAL_TABLET | Freq: Two times a day (BID) | ORAL | Status: DC

## 2013-03-03 NOTE — Progress Notes (Signed)
Nicholas [name] Date of Birth  1941/07/03       [facility] Office [address]  [address] [place], [place]  [address]   [place], [place]  [address] [phone]     [phone]   Fax  [phone]    Fax [phone]  Problem List: 1. Chronic kidney disease-on hemodialysis 2. Paroxysmal Atrial fibrillation 3. Chronic congestive heart failure 4. Recent hospitalization with pneumonia 5. Diabetes mellitus 6. chronic systolic congestive heart failure 7. Mild pulmonary hypertension 8. GI bleed 9. Anemia 10. Right Carotid artery stenosis - s/p CEA 11. CAD:  CORONARY ARTERY BYPASS GRAFTING (CABG)X5 LIMA-LAD; SEQ SVG-OM1-OM2; SVG-DIAG; SVG-PDA  ENDARTERECTOMY CAROTID(R) WITH DPA    - Left ventricle: The cavity size was normal. There was mild concentric hypertrophy. Systolic function was moderately reduced. The estimated ejection fraction was in the range of 35% to 40%. There is moderate hypokinesis of the entireinferior myocardium. Left ventricular diastolic function parameters were normal. - Aortic valve: Moderate diffuse thickening and calcification, consistent with sclerosis. - Mitral valve: Moderate to severe regurgitation directed eccentrically and posteriorly. - Left atrium: The atrium was mildly dilated. - Right ventricle: The cavity size was mildly dilated. Systolic function was moderately reduced. - Right atrium: The atrium was mildly dilated. - Atrial septum: No defect or patent foramen ovale was identified. - Pulmonary arteries: PA peak pressure: 46mm Hg (S). Impressions:  - The right ventricular systolic pressure was increased consistent with mild pulmonary hypertension   History of Present Illness:  Nicholas [name] is a 72 year old gentleman with the above-noted medical history. He was admitted to the hospital in March for pneumonia, anemia ( Hb of 6), GI bleed,  NSTEMI.  He is now back home.  He is now  on hemodialysis.  He's not had any episodes of chest pain since he left the hospital. He's not having any shortness breath. He's been able to clean his house and do his own cooking without severe shortness of breath, chest pain or dizziness.  He has not had any palpitations.  He is walking with a walker or cane.    He is scheduled to have right CEA at [facility] on April 17 ( 2 days from now).  He has had a [employer] as part of a pre-op evaluation.  His ejection fraction was 56% at that time.  Since that study was done, he was admitted to [facility] with pneumonia, anemia, NSTEMI, worsening renal failure. He had respiratory failure and was on a ventilator for several days.  Echo at that time revealed inferior hypokinesis and an ejection fraction of 35% to 40%.  March 03, 2013:  Nicholas [name] has had 3 V CABG and R CEA since I last saw him.  He is now in SNF ( [facility])  Current Outpatient Prescriptions on File Prior to Visit  Medication Sig Dispense Refill  . acetaminophen (TYLENOL) 500 MG tablet Take 500 mg by mouth every 4 (four) hours as needed for pain.      [name] allopurinol (ZYLOPRIM) 100 MG tablet Take 100 mg by mouth 2 (two) times daily.       [name] amiodarone (PACERONE) 200 MG tablet Take 200 mg by mouth daily.      [name] aspirin EC 325 MG EC tablet Take 1 tablet (325 mg total) by mouth daily.  30 tablet  0  . darbepoetin (ARANESP) 100 MCG/0.5ML SOLN Inject 0.5 mLs (100 mcg total)  into the vein every Friday with hemodialysis.  4.2 mL    . fish oil-omega-3 fatty acids 1000 MG capsule Take 1 g by mouth 2 (two) times daily.       [name] gabapentin (NEURONTIN) 300 MG capsule Take 1 capsule (300 mg total) by mouth 2 (two) times daily.      . insulin aspart (NOVOLOG) 100 UNIT/ML injection Inject 0-8 Units into the skin 3 (three) times daily before meals. 0-99= no insulin; 100-150= 2 units; 151-200=4 units; 201-250=6 units; greater than 250= 8 units.      . insulin glargine (LANTUS) 100 UNIT/ML injection Inject  15 Units into the skin at bedtime.      . lidocaine-prilocaine (EMLA) cream Apply 1 application topically every Monday, Wednesday, and Friday.       . Multiple Vitamins-Minerals (MULTIVITAMIN WITH MINERALS) tablet Take 1 tablet by mouth at bedtime.       . multivitamin (RENA-VIT) TABS tablet Take 1 tablet by mouth at bedtime.       . pantoprazole (PROTONIX) 40 MG tablet Take 40 mg by mouth daily.      [name] senna-docusate (SENOKOT-S) 8.6-50 MG per tablet Take 1 tablet by mouth daily as needed for constipation.      . sevelamer carbonate (RENVELA) 800 MG tablet Take 800 mg by mouth 3 (three) times daily with meals.      . sodium chloride 0.9 % SOLN 100 mL with ferric gluconate 12.5 MG/ML SOLN 62.5 mg Inject 62.5 mg into the vein every Wednesday with hemodialysis.      [name] tamsulosin (FLOMAX) 0.4 MG CAPS Take 0.4 mg by mouth at bedtime.      . traMADol (ULTRAM) 50 MG tablet Take 50 mg by mouth every 8 (eight) hours as needed for pain.      [name] zolpidem (AMBIEN) 5 MG tablet Take 1 tablet (5 mg total) by mouth at bedtime as needed for sleep.  30 tablet  0  . traMADol-acetaminophen (ULTRACET) 37.5-325 MG per tablet        No current facility-administered medications on file prior to visit.    Allergies  Allergen Reactions  . Statins Other (See Comments)    Muscle weakness     Past Medical History  Diagnosis Date  . Coronary artery disease     a. hx of MI 1992; b. 10/2012 NSTEMI (no cath 2/2 GIB);  c. 11/2012 MV: Inf defect w inf and septal HK (favor prior MI), EF 47%;  d. 12/2012 Cath: Severe 3vd, EF 45-50%;  e. 01/2013 CABG x 5 (LIMA->LAD, VG->D1, VG->OM1->OM2, VG->PDA)  . Diabetes mellitus   . Hypertension   . Arthritis   . Carotid artery disease     a. 01/2013 doppler: >80% RICA stenosis, <39% LICA stenosis;  b. 01/2013 R CEA.  . Gastritis 2012 and 09/2011    treated for H Pylori in 12/2011  . Diabetic neuropathy   . Osteoporosis   . COPD (chronic obstructive pulmonary disease)   . Anxiety   .  Depression   . ESRD (end stage renal disease)     M-W-Sa  [name]  . Diabetic nephropathy   . GERD (gastroesophageal reflux disease)   . Neuromuscular disorder     peripheral neurotherapy  . Anemia   . A-fib     a. Dx 10/2012, on amio, no anticoagulation 2/2 GIB 10/2012.  [name] GIB (gastrointestinal bleeding)     a. 10/2012, awaiting colonoscopy.    Past Surgical History  Procedure Laterality  Date  . Coronary stent placement    . Hip surgery      both hips  . Av fistula placement      Left arm  . Cardiac catheterization    . Joint replacement Bilateral   . Eye surgery Bilateral     cararacts  . Coronary artery bypass graft N/A 02/03/2013    Procedure: CORONARY ARTERY BYPASS GRAFTING (CABG);  Surgeon: Loreli Slot, MD;  Location: [facility] OR;  Service: Open Heart Surgery;  Laterality: N/A;  . Endarterectomy N/A 02/03/2013    Procedure: ENDARTERECTOMY CAROTID;  Surgeon: Pryor Ochoa, MD;  Location: [facility] OR;  Service: Vascular;  Laterality: N/A;    History  Smoking status  . Former Smoker -- 1.00 packs/day for 25 years  . Types: Cigarettes  . Start date: 08/18/1965  . Quit date: 08/18/1990  Smokeless tobacco  . Not on file    History  Alcohol Use  . Yes    Comment: occasionally    Family History  Problem Relation Age of Onset  . Heart attack Father 67  . Heart disease Father   . Stroke Mother   . Hypertension Mother   . Other Mother     varicose veins  . Stroke Maternal Grandmother   . Hypertension Brother     Reviw of Systems:  Reviewed in the HPI.  All other systems are negative.  Physical Exam: Blood pressure 118/64, pulse 84, height 5\' 6"  (1.676 m), weight 169 lb (76.658 kg). General: Well developed, well nourished, in no acute distress.  He looks somewhat pale  Head: Normocephalic, atraumatic, sclera non-icteric, mucus membranes are moist,   Neck: Supple. Carotids are 2 + without bruits. No JVD   Lungs: Clear   Heart: sternotomy is healing  well.  RR, normal S1, S2, his heart is hyperdynamic.   Abdomen: Soft, non-tender, non-distended with normal bowel sounds.  Msk:  Strength and tone are normal   Extremities: no edema,  The renal fistula is functioning.     Neuro: CN II - XII intact.  Alert and oriented X 3.   Psych:  Normal   ECG:    Assessment / Plan:

## 2013-03-03 NOTE — Patient Instructions (Addendum)
Your physician has recommended you make the following change in your medication:   INCREASE METOPROLOL TO 25 MG TWICE DAILY 12 HOURS APART.   Your physician wants you to follow-up in: 3 MONTHS You will receive a reminder letter in the mail two months in advance. If you don't receive a letter, please call our office to schedule the follow-up appointment.

## 2013-03-03 NOTE — Assessment & Plan Note (Signed)
Nicholas [name] is doing well. He is healing up nicely from his coronary artery bypass grafting. His cardiac impulse is somewhat hyperdynamic. Some of this is because of his renal failure and dialysis fistula. He also may be somewhat deconditioned. Her to slow his rate slightly by increasing his metoprolol to 25 mg twice a day. I'll see him in 3 months for followup office visit, EKG,

## 2013-03-14 ENCOUNTER — Encounter: Payer: Self-pay | Admitting: Vascular Surgery

## 2013-03-15 ENCOUNTER — Encounter: Payer: Self-pay | Admitting: Vascular Surgery

## 2013-03-15 ENCOUNTER — Ambulatory Visit (INDEPENDENT_AMBULATORY_CARE_PROVIDER_SITE_OTHER): Payer: Medicare Other | Admitting: Vascular Surgery

## 2013-03-15 VITALS — BP 128/78 | HR 85 | Resp 18 | Ht 66.0 in | Wt 164.9 lb

## 2013-03-15 DIAGNOSIS — I779 Disorder of arteries and arterioles, unspecified: Secondary | ICD-10-CM

## 2013-03-15 DIAGNOSIS — I6529 Occlusion and stenosis of unspecified carotid artery: Secondary | ICD-10-CM

## 2013-03-15 NOTE — Progress Notes (Signed)
Subjective:     Patient ID: Nicholas [name], male   DOB: 1941-03-22, 72 y.o.   MRN: [id number]  HPI this 72 year old male is seen in followup regarding a combined right carotid endarterectomy and coronary artery bypass graft done by Dr. Andrey Spearman 6 weeks ago. Patient denies any swallowing problems, lateralizing weakness, aphasia, amaurosis fugax, diplopia, blurred vision, or syncope. He also has no hoarseness.  Review of Systems     Objective:   Physical Exam BP 128/78  Pulse 85  Resp 18  Ht 5\' 6"  (1.676 m)  Wt 164 lb 14.4 oz (74.798 kg)  BMI 26.63 kg/m2  Gen. chronically ill-appearing male in no apparent distress Right neck incision has healed nicely. 3+ pulse with no audible bruits. Neurologic exam normal.      Assessment:     Doing well post right carotid endarterectomy in conjunction with coronary artery bypass grafting-asymptomatic from carotid standpoint    Plan:     Return in 6 months with followup carotid duplex exam

## 2013-03-28 ENCOUNTER — Other Ambulatory Visit: Payer: Self-pay | Admitting: *Deleted

## 2013-03-28 DIAGNOSIS — I2510 Atherosclerotic heart disease of native coronary artery without angina pectoris: Secondary | ICD-10-CM

## 2013-03-29 ENCOUNTER — Ambulatory Visit
Admission: RE | Admit: 2013-03-29 | Discharge: 2013-03-29 | Disposition: A | Discharge status: Home / Self Care | Payer: Medicare Other | Source: Ambulatory Visit | Attending: Thoracic Surgery (Cardiothoracic Vascular Surgery) | Admitting: Thoracic Surgery (Cardiothoracic Vascular Surgery)

## 2013-03-29 ENCOUNTER — Ambulatory Visit (INDEPENDENT_AMBULATORY_CARE_PROVIDER_SITE_OTHER): Payer: Self-pay | Admitting: Thoracic Surgery (Cardiothoracic Vascular Surgery)

## 2013-03-29 ENCOUNTER — Encounter: Payer: Self-pay | Admitting: Thoracic Surgery (Cardiothoracic Vascular Surgery)

## 2013-03-29 VITALS — BP 147/85 | HR 84 | Resp 20 | Ht 66.0 in | Wt 164.0 lb

## 2013-03-29 DIAGNOSIS — I779 Disorder of arteries and arterioles, unspecified: Secondary | ICD-10-CM

## 2013-03-29 DIAGNOSIS — I2510 Atherosclerotic heart disease of native coronary artery without angina pectoris: Secondary | ICD-10-CM

## 2013-03-29 DIAGNOSIS — Z951 Presence of aortocoronary bypass graft: Secondary | ICD-10-CM

## 2013-03-29 NOTE — Progress Notes (Signed)
HPI:  [name] is a 72 year old gentleman who has multiple medical problems including ischemic cardiomyopathy, diabetes, and end-stage renal disease which is on hemodialysis. He had severe three-vessel coronary disease and a tight right internal carotid stenosis. He underwent a combined coronary bypass grafting x5 in right carotid endarterectomy on June 19. I saw him back in the office on July 15. At that time he was making progress, but was still at [facility].  Since that visit he says he's continued to get better. He doesn't complain of any chest pain or shortness of breath. He feels poorly today because he had dialysis yesterday he says it takes until about the afternoon the following day before he feels well again. He has been driving himself to and from dialysis without problems.  Past Medical History  Diagnosis Date  . Coronary artery disease     a. hx of MI 1992; b. 10/2012 NSTEMI (no cath 2/2 GIB);  c. 11/2012 MV: Inf defect w inf and septal HK (favor prior MI), EF 47%;  d. 12/2012 Cath: Severe 3vd, EF 45-50%;  e. 01/2013 CABG x 5 (LIMA->LAD, VG->D1, VG->OM1->OM2, VG->PDA)  . Diabetes mellitus   . Hypertension   . Arthritis   . Carotid artery disease     a. 01/2013 doppler: >80% RICA stenosis, <39% LICA stenosis;  b. 01/2013 R CEA.  . Gastritis 2012 and 09/2011    treated for H Pylori in 12/2011  . Diabetic neuropathy   . Osteoporosis   . COPD (chronic obstructive pulmonary disease)   . Anxiety   . Depression   . ESRD (end stage renal disease)     M-W-Sa  [name]  . Diabetic nephropathy   . GERD (gastroesophageal reflux disease)   . Neuromuscular disorder     peripheral neurotherapy  . Anemia   . A-fib     a. Dx 10/2012, on amio, no anticoagulation 2/2 GIB 10/2012.  [name] GIB (gastrointestinal bleeding)     a. 10/2012, awaiting colonoscopy.      Current Outpatient Prescriptions  Medication Sig Dispense Refill  . acetaminophen (TYLENOL) 500 MG tablet Take 500 mg by mouth  every 4 (four) hours as needed for pain.      [name] allopurinol (ZYLOPRIM) 100 MG tablet Take 100 mg by mouth 2 (two) times daily.       [name] amiodarone (PACERONE) 200 MG tablet Take 200 mg by mouth daily.      [name] aspirin EC 325 MG EC tablet Take 1 tablet (325 mg total) by mouth daily.  30 tablet  0  . darbepoetin (ARANESP) 100 MCG/0.5ML SOLN Inject 0.5 mLs (100 mcg total) into the vein every Friday with hemodialysis.  4.2 mL    . docusate sodium (COLACE) 100 MG capsule Take 100 mg by mouth 2 (two) times daily.      . fish oil-omega-3 fatty acids 1000 MG capsule Take 1 g by mouth 2 (two) times daily.       [name] gabapentin (NEURONTIN) 300 MG capsule Take 1 capsule (300 mg total) by mouth 2 (two) times daily.      . insulin aspart (NOVOLOG) 100 UNIT/ML injection Inject 0-8 Units into the skin 3 (three) times daily before meals. 0-99= no insulin; 100-150= 2 units; 151-200=4 units; 201-250=6 units; greater than 250= 8 units.      . insulin glargine (LANTUS) 100 UNIT/ML injection Inject 15 Units into the skin at bedtime.      [name] ipratropium (ATROVENT) 0.06 % nasal spray Place  1 spray into the nose 3 (three) times daily.      [name] lidocaine-prilocaine (EMLA) cream Apply 1 application topically every Monday, Wednesday, and Friday.       . metoprolol tartrate (LOPRESSOR) 25 MG tablet Take 1 tablet (25 mg total) by mouth 2 (two) times daily.  60 tablet  6  . Multiple Vitamins-Minerals (MULTIVITAMIN WITH MINERALS) tablet Take 1 tablet by mouth at bedtime.       . multivitamin (RENA-VIT) TABS tablet Take 1 tablet by mouth at bedtime.       . pantoprazole (PROTONIX) 40 MG tablet Take 40 mg by mouth daily.      [name] senna-docusate (SENOKOT-S) 8.6-50 MG per tablet Take 1 tablet by mouth daily as needed for constipation.      . sevelamer carbonate (RENVELA) 800 MG tablet Take 800 mg by mouth 3 (three) times daily with meals.      . sodium chloride 0.9 % SOLN 100 mL with ferric gluconate 12.5 MG/ML SOLN 62.5 mg Inject 62.5 mg into  the vein every Wednesday with hemodialysis.      [name] tamsulosin (FLOMAX) 0.4 MG CAPS Take 0.4 mg by mouth at bedtime.      . traMADol (ULTRAM) 50 MG tablet Take 50 mg by mouth every 8 (eight) hours as needed for pain.      [name] zolpidem (AMBIEN) 5 MG tablet Take 1 tablet (5 mg total) by mouth at bedtime as needed for sleep.  30 tablet  0   No current facility-administered medications for this visit.    Physical Exam BP 147/85  Pulse 84  Resp 20  Ht 5\' 6"  (1.676 m)  Wt 164 lb (74.39 kg)  BMI 26.48 kg/m2  SpO72 81% 72 year old male in no acute distress Neurologic alert and oriented x3 with no focal deficit Lungs diminished breath sounds left base otherwise clear Incision clean dry and intact, sternum stable No peripheral edema  Diagnostic Tests: Chest x-ray March 29, 2013 *RADIOLOGY REPORT*  Clinical Data: Coronary artery disease  CHEST - 2 VIEW  Comparison: 03/01/2013  Findings: Left hemidiaphragm is elevated and unchanged. Left  pleural effusion and left lower lobe atelectasis unchanged.  Negative for heart failure. Small right effusion. 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  IMPRESSION:  Left lower lobe atelectasis and effusion. Improvement in small  right effusion compared with the prior study. Negative for heart  failure or edema.  Original Report Authenticated By: Janeece Riggers, M.D.   Impression: 72 year old gentleman now about 2 months out from a combined CABG, right carotid endarterectomy. Overall he is doing well at this point in time. 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 Hopefully this will recover over time as it does in about 90% of cases. He does not seem to be causing him any significant symptoms at this point in time.  He has been driving without difficulty. I advised him to remain very cautious with his driving. Otherwise there are no restrictions on him from a surgical standpoint.  Plan: He'll continue  to be followed by Dr. Worthy Keeler and Dr. Kristeen Miss  I will be happy to see him back any time if I can be of any further assistance with his care in the future.

## 2013-04-06 ENCOUNTER — Ambulatory Visit: Payer: Medicare Other | Admitting: Physician Assistant

## 2013-04-28 ENCOUNTER — Ambulatory Visit: Payer: Self-pay | Admitting: Vascular Surgery

## 2013-06-24 ENCOUNTER — Telehealth: Payer: Self-pay | Admitting: Cardiovascular Disease

## 2013-06-24 NOTE — Telephone Encounter (Signed)
Received request from Nurse fax box, documents faxed for surgical clearance. To: [facility] Fax number: [phone] Attention: 06/24/13/KM

## 2013-07-08 ENCOUNTER — Other Ambulatory Visit: Payer: Self-pay | Admitting: Vascular Surgery

## 2013-07-08 DIAGNOSIS — Z48812 Encounter for surgical aftercare following surgery on the circulatory system: Secondary | ICD-10-CM

## 2013-07-19 ENCOUNTER — Encounter (HOSPITAL_COMMUNITY): Payer: Self-pay | Admitting: Pharmacy Technician

## 2013-07-20 ENCOUNTER — Other Ambulatory Visit (HOSPITAL_COMMUNITY): Payer: Self-pay | Admitting: *Deleted

## 2013-07-20 NOTE — Pre-Procedure Instructions (Signed)
EDIS HUISH  07/20/2013   Your procedure is scheduled on:  Thursday, July 28, 2013 at 12:45 PM.   Report to [facility] Entrance "A" at 10:45 AM.   Call this number if you have problems the morning of surgery: [phone]   Remember:   Do not eat food or drink liquids after midnight Wednesday, 07/27/13.   Take these medicines the morning of surgery with A SIP OF WATER: amiodarone (PACERONE), gabapentin (NEURONTIN), pantoprazole (PROTONIX), allopurinol (ZYLOPRIM), acetaminophen (TYLENOL) - if needed.  Stop all Vitamins, Herbal Medications and Fish Oil as of today 07/21/13.                Do not wear jewelry.  Do not wear lotions, powders, or cologne. You may wear deodorant.             Men may shave face and neck.  Do not bring valuables to the hospital.  [facility] is not responsible  for any belongings or valuables.               Contacts, dentures or bridgework may not be worn into surgery.  Leave suitcase in the car. After surgery it may be brought to your room.  For patients admitted to the hospital, discharge time is determined by your treatment team.      Special Instructions: Shower using CHG 2 nights before surgery and the night before surgery.  If you shower the day of surgery use CHG.  Use special wash - you have one bottle of CHG for all showers.  You should use approximately 1/3 of the bottle for each shower.   Please read over the following fact sheets that you were given: Pain Booklet, Coughing and Deep Breathing and Surgical Site Infection Prevention

## 2013-07-21 ENCOUNTER — Encounter (HOSPITAL_COMMUNITY)
Admission: RE | Admit: 2013-07-21 | Discharge: 2013-07-21 | Disposition: A | Discharge status: Home / Self Care | Payer: Medicare Other | Source: Ambulatory Visit | Attending: Orthopedic Surgery | Admitting: Orthopedic Surgery

## 2013-07-21 DIAGNOSIS — Z01812 Encounter for preprocedural laboratory examination: Secondary | ICD-10-CM | POA: Insufficient documentation

## 2013-07-21 DIAGNOSIS — Z01818 Encounter for other preprocedural examination: Secondary | ICD-10-CM | POA: Insufficient documentation

## 2013-07-21 LAB — CBC
Hemoglobin: 12.5 g/dL — ABNORMAL LOW (ref 13.0–17.0)
Platelets: 328 10*3/uL (ref 150–400)
RBC: 3.95 MIL/uL — ABNORMAL LOW (ref 4.22–5.81)
WBC: 9.4 10*3/uL (ref 4.0–10.5)

## 2013-07-21 LAB — BASIC METABOLIC PANEL
GFR calc Af Amer: 15 mL/min — ABNORMAL LOW (ref >90)
GFR calc non Af Amer: 13 mL/min — ABNORMAL LOW (ref >90)
Glucose, Bld: 139 mg/dL — ABNORMAL HIGH (ref 70–99)
Potassium: 3.8 mEq/L (ref 3.5–5.1)
Sodium: 136 mEq/L (ref 135–145)

## 2013-07-21 NOTE — Progress Notes (Signed)
No presurgical orders, Clydie Braun notified

## 2013-07-22 NOTE — Progress Notes (Signed)
Anesthesia Chart Review:  Patient is a 72 year old male scheduled for right elbow olecranon bursectomy and advancement closure on 07/28/13 by Dr. Melvyn Novas.    History includes CAD s/p CABG (LIMA to LAD, SVG to D1, sequential SVG to OM1 and OM2, SVG to PDA) with combined right CEA on 02/03/13, paroxsymal afib, chronic systolic CHF, mild pulmonary hypertension, carotid artery stenosis s/p right CEA, CKD on hemodialysis [facility] MWF, DM on insulin with neuropathy, COPD, depression, anxiety, osteoporosis, gastritis, GI bleed, anemia, former smoker, CT surgeon: Dr. Dorris Fetch.  Vascular surgeon: Dr. Hart Rochester.  PCP: Dr. Daniel Nones. Nephrologist is with [employer].  Cardiologist is Dr. Elease Hashimoto who cleared patient for this procedure.  Last nuclear stress test (12/10/12) and cardiac cath were pre-CABG.  Cardiac cath on 01/11/13 showed severe 3V CAD and ischemic cardiomyopathy with EF 45-50%.  Echo on 12/02/12 showed: Normal LV size with mild LV hypertrophy. EF 50% with mild global hypokinesis. Normal RV size and systolic function. Mild MR. Trivial TR/PR.  Pre- right carotid endarterectomy carotid duplex on 01/20/13 showed > 80% RICA stenosis, < 39% LICA stenosis.  EKG on 02/16/13 showed SR, early precordial R/S transition, borderline T wave abnormalities in the anterior leads.  PFTs on 02/01/13 showed FVC 3.23 (101%), FEV1 2.39 (103%), DLCO 17.26 (80%).  CXR on 07/21/13 showed: The patient has had median sternotomy and CABG. The heart is mildly enlarged. There is elevation of left hemidiaphragm. Left lung base atelectasis or scar is noted. There are no focal consolidations or  pleural effusions. No pulmonary edema. Note is made of chronic changes in both shoulders. IMPRESSION: 1. Cardiomegaly without pulmonary edema. 2. Stable changes at the left lung base.  Preoperative labs noted.  He will get an ISTAT on arrival.  He is s/p CABG earlier this year, and has cardiac clearance.  He is also a  dialysis patient so will be getting an ISTAT on arrival.  If no acute changes and labs are reasonable then I would anticipate that he could proceed as planned.  Velna Ochs [facility] Stay [facility]/Anesthesiology Phone [phone] 07/22/2013 5:45 PM

## 2013-07-26 NOTE — Progress Notes (Signed)
Spoke with Dr Evaristo Bury office and relayed that he needs to enter preop orders into [facility]

## 2013-07-27 NOTE — Progress Notes (Signed)
Left message for Dr. Bari Edward scheduler that no orders in epic for pt.

## 2013-07-28 ENCOUNTER — Observation Stay (HOSPITAL_COMMUNITY)
Admission: RE | Admit: 2013-07-28 | Discharge: 2013-07-29 | Disposition: A | Discharge status: Home / Self Care | Payer: Medicare Other | Source: Ambulatory Visit | Attending: Orthopedic Surgery | Admitting: Orthopedic Surgery

## 2013-07-28 ENCOUNTER — Encounter (HOSPITAL_COMMUNITY)
Admission: RE | Disposition: A | Discharge status: Home / Self Care | Payer: Self-pay | Source: Ambulatory Visit | Attending: Orthopedic Surgery

## 2013-07-28 ENCOUNTER — Ambulatory Visit (HOSPITAL_COMMUNITY): Payer: Medicare Other | Admitting: Anesthesiology

## 2013-07-28 ENCOUNTER — Encounter (HOSPITAL_COMMUNITY): Payer: Medicare Other | Admitting: Vascular Surgery

## 2013-07-28 ENCOUNTER — Encounter (HOSPITAL_COMMUNITY): Payer: Self-pay | Admitting: *Deleted

## 2013-07-28 DIAGNOSIS — E1149 Type 2 diabetes mellitus with other diabetic neurological complication: Secondary | ICD-10-CM | POA: Insufficient documentation

## 2013-07-28 DIAGNOSIS — Z992 Dependence on renal dialysis: Secondary | ICD-10-CM | POA: Insufficient documentation

## 2013-07-28 DIAGNOSIS — J449 Chronic obstructive pulmonary disease, unspecified: Secondary | ICD-10-CM | POA: Insufficient documentation

## 2013-07-28 DIAGNOSIS — I120 Hypertensive chronic kidney disease with stage 5 chronic kidney disease or end stage renal disease: Secondary | ICD-10-CM | POA: Insufficient documentation

## 2013-07-28 DIAGNOSIS — J4489 Other specified chronic obstructive pulmonary disease: Secondary | ICD-10-CM | POA: Insufficient documentation

## 2013-07-28 DIAGNOSIS — E1129 Type 2 diabetes mellitus with other diabetic kidney complication: Secondary | ICD-10-CM | POA: Insufficient documentation

## 2013-07-28 DIAGNOSIS — Z7982 Long term (current) use of aspirin: Secondary | ICD-10-CM | POA: Insufficient documentation

## 2013-07-28 DIAGNOSIS — E1142 Type 2 diabetes mellitus with diabetic polyneuropathy: Secondary | ICD-10-CM | POA: Insufficient documentation

## 2013-07-28 DIAGNOSIS — Z794 Long term (current) use of insulin: Secondary | ICD-10-CM | POA: Insufficient documentation

## 2013-07-28 DIAGNOSIS — M7020 Olecranon bursitis, unspecified elbow: Principal | ICD-10-CM | POA: Insufficient documentation

## 2013-07-28 DIAGNOSIS — N186 End stage renal disease: Secondary | ICD-10-CM | POA: Insufficient documentation

## 2013-07-28 DIAGNOSIS — M7030 Other bursitis of elbow, unspecified elbow: Secondary | ICD-10-CM | POA: Diagnosis present

## 2013-07-28 HISTORY — PX: OLECRANON BURSECTOMY: SHX2097

## 2013-07-28 LAB — POCT I-STAT 4, (NA,K, GLUC, HGB,HCT)
Glucose, Bld: 134 mg/dL — ABNORMAL HIGH (ref 70–99)
HCT: 39 % (ref 39.0–52.0)
Hemoglobin: 13.3 g/dL (ref 13.0–17.0)
Sodium: 135 mEq/L (ref 135–145)

## 2013-07-28 LAB — GLUCOSE, CAPILLARY: Glucose-Capillary: 210 mg/dL — ABNORMAL HIGH (ref 70–99)

## 2013-07-28 SURGERY — BURSECTOMY, ELBOW
Anesthesia: General | Site: Elbow | Laterality: Right

## 2013-07-28 MED ORDER — ONDANSETRON HCL 4 MG PO TABS: 4.0000 mg | ORAL_TABLET | Freq: Four times a day (QID) | ORAL | Status: DC | PRN

## 2013-07-28 MED ORDER — ZOLPIDEM TARTRATE 5 MG PO TABS
5.0000 mg | ORAL_TABLET | Freq: Every evening | ORAL | Status: DC | PRN
  Administered 2013-07-28: 5 mg via ORAL
  Filled 2013-07-28: qty 1

## 2013-07-28 MED ORDER — MIDAZOLAM HCL 5 MG/5ML IJ SOLN
INTRAMUSCULAR | Status: DC | PRN
  Administered 2013-07-28 (×2): 0.5 mg via INTRAVENOUS

## 2013-07-28 MED ORDER — PANTOPRAZOLE SODIUM 40 MG PO TBEC
40.0000 mg | DELAYED_RELEASE_TABLET | Freq: Every day | ORAL | Status: DC
  Administered 2013-07-29: 40 mg via ORAL
  Filled 2013-07-28: qty 1

## 2013-07-28 MED ORDER — CEFAZOLIN SODIUM-DEXTROSE 2-3 GM-% IV SOLR
2.0000 g | INTRAVENOUS | Status: DC
  Filled 2013-07-28: qty 50

## 2013-07-28 MED ORDER — CEFAZOLIN SODIUM 1-5 GM-% IV SOLN
1.0000 g | Freq: Three times a day (TID) | INTRAVENOUS | Status: DC
  Administered 2013-07-29: 1 g via INTRAVENOUS
  Filled 2013-07-28 (×3): qty 50

## 2013-07-28 MED ORDER — 0.9 % SODIUM CHLORIDE (POUR BTL) OPTIME
TOPICAL | Status: DC | PRN
  Administered 2013-07-28: 1000 mL

## 2013-07-28 MED ORDER — OXYCODONE-ACETAMINOPHEN 5-325 MG PO TABS: 1.0000 | ORAL_TABLET | ORAL | Status: DC | PRN

## 2013-07-28 MED ORDER — IPRATROPIUM BROMIDE 0.06 % NA SOLN
1.0000 | Freq: Three times a day (TID) | NASAL | Status: DC
  Filled 2013-07-28: qty 15

## 2013-07-28 MED ORDER — ONDANSETRON HCL 4 MG/2ML IJ SOLN: 4.0000 mg | Freq: Four times a day (QID) | INTRAMUSCULAR | Status: DC | PRN

## 2013-07-28 MED ORDER — DOCUSATE SODIUM 100 MG PO CAPS: 100.0000 mg | ORAL_CAPSULE | Freq: Two times a day (BID) | ORAL | Status: DC

## 2013-07-28 MED ORDER — DEXTROSE 5 % IV SOLN
500.0000 mg | Freq: Four times a day (QID) | INTRAVENOUS | Status: DC | PRN
  Filled 2013-07-28: qty 5

## 2013-07-28 MED ORDER — OXYCODONE HCL 5 MG PO TABS: 5.0000 mg | ORAL_TABLET | ORAL | Status: DC | PRN

## 2013-07-28 MED ORDER — ONDANSETRON HCL 4 MG/2ML IJ SOLN
INTRAMUSCULAR | Status: DC | PRN
  Administered 2013-07-28: 4 mg via INTRAVENOUS

## 2013-07-28 MED ORDER — AMIODARONE HCL 200 MG PO TABS
200.0000 mg | ORAL_TABLET | Freq: Every day | ORAL | Status: DC
  Administered 2013-07-29: 200 mg via ORAL
  Filled 2013-07-28: qty 1

## 2013-07-28 MED ORDER — ADULT MULTIVITAMIN W/MINERALS CH
1.0000 | ORAL_TABLET | Freq: Every day | ORAL | Status: DC
  Filled 2013-07-28: qty 1

## 2013-07-28 MED ORDER — VITAMIN C 500 MG PO TABS
1000.0000 mg | ORAL_TABLET | Freq: Every day | ORAL | Status: DC
  Administered 2013-07-28 – 2013-07-29 (×2): 1000 mg via ORAL
  Filled 2013-07-28 (×2): qty 2

## 2013-07-28 MED ORDER — CEFAZOLIN SODIUM 1-5 GM-% IV SOLN
1.0000 g | INTRAVENOUS | Status: AC
  Administered 2013-07-28: 1 g via INTRAVENOUS
  Filled 2013-07-28: qty 50

## 2013-07-28 MED ORDER — FENTANYL CITRATE 0.05 MG/ML IJ SOLN
INTRAMUSCULAR | Status: DC | PRN
  Administered 2013-07-28 (×3): 50 ug via INTRAVENOUS

## 2013-07-28 MED ORDER — ARTIFICIAL TEARS OP OINT
TOPICAL_OINTMENT | OPHTHALMIC | Status: DC | PRN
  Administered 2013-07-28: 1 via OPHTHALMIC

## 2013-07-28 MED ORDER — SODIUM CHLORIDE 0.9 % IV SOLN: 62.5000 mg | INTRAVENOUS | Status: DC

## 2013-07-28 MED ORDER — PROPOFOL 10 MG/ML IV BOLUS
INTRAVENOUS | Status: DC | PRN
  Administered 2013-07-28: 140 mg via INTRAVENOUS

## 2013-07-28 MED ORDER — RENA-VITE PO TABS
1.0000 | ORAL_TABLET | Freq: Every day | ORAL | Status: DC
  Administered 2013-07-28: 1 via ORAL
  Filled 2013-07-28 (×2): qty 1

## 2013-07-28 MED ORDER — METHOCARBAMOL 500 MG PO TABS: 500.0000 mg | ORAL_TABLET | Freq: Four times a day (QID) | ORAL | Status: DC | PRN

## 2013-07-28 MED ORDER — PHENYLEPHRINE HCL 10 MG/ML IJ SOLN
INTRAMUSCULAR | Status: DC | PRN
  Administered 2013-07-28 (×6): 80 ug via INTRAVENOUS
  Administered 2013-07-28 (×2): 40 ug via INTRAVENOUS
  Administered 2013-07-28 (×2): 80 ug via INTRAVENOUS
  Administered 2013-07-28 (×2): 40 ug via INTRAVENOUS
  Administered 2013-07-28 (×2): 80 ug via INTRAVENOUS

## 2013-07-28 MED ORDER — ONDANSETRON HCL 4 MG/2ML IJ SOLN: 4.0000 mg | Freq: Once | INTRAMUSCULAR | Status: DC | PRN

## 2013-07-28 MED ORDER — INSULIN GLARGINE 100 UNIT/ML ~~LOC~~ SOLN
15.0000 [IU] | Freq: Every day | SUBCUTANEOUS | Status: DC
  Administered 2013-07-28: 15 [IU] via SUBCUTANEOUS
  Filled 2013-07-28 (×2): qty 0.15

## 2013-07-28 MED ORDER — BUPIVACAINE HCL (PF) 0.25 % IJ SOLN
INTRAMUSCULAR | Status: AC
  Filled 2013-07-28: qty 10

## 2013-07-28 MED ORDER — SEVELAMER CARBONATE 800 MG PO TABS
800.0000 mg | ORAL_TABLET | Freq: Three times a day (TID) | ORAL | Status: DC
  Administered 2013-07-28 – 2013-07-29 (×2): 800 mg via ORAL
  Filled 2013-07-28 (×5): qty 1

## 2013-07-28 MED ORDER — HYDROMORPHONE HCL PF 1 MG/ML IJ SOLN
INTRAMUSCULAR | Status: AC
  Administered 2013-07-28: 0.5 mg via INTRAVENOUS
  Filled 2013-07-28: qty 1

## 2013-07-28 MED ORDER — KCL IN DEXTROSE-NACL 20-5-0.45 MEQ/L-%-% IV SOLN
INTRAVENOUS | Status: AC
  Filled 2013-07-28: qty 1000

## 2013-07-28 MED ORDER — SODIUM CHLORIDE 0.9 % IV SOLN
INTRAVENOUS | Status: DC
  Administered 2013-07-28 (×2): via INTRAVENOUS

## 2013-07-28 MED ORDER — ALLOPURINOL 100 MG PO TABS
100.0000 mg | ORAL_TABLET | Freq: Two times a day (BID) | ORAL | Status: DC
  Administered 2013-07-28 – 2013-07-29 (×2): 100 mg via ORAL
  Filled 2013-07-28 (×3): qty 1

## 2013-07-28 MED ORDER — CHLORHEXIDINE GLUCONATE 4 % EX LIQD: 60.0000 mL | Freq: Once | CUTANEOUS | Status: DC

## 2013-07-28 MED ORDER — KCL IN DEXTROSE-NACL 20-5-0.45 MEQ/L-%-% IV SOLN
INTRAVENOUS | Status: DC
  Filled 2013-07-28 (×2): qty 1000

## 2013-07-28 MED ORDER — INSULIN ASPART 100 UNIT/ML ~~LOC~~ SOLN: 0.0000 [IU] | Freq: Three times a day (TID) | SUBCUTANEOUS | Status: DC

## 2013-07-28 MED ORDER — HYDROCODONE-ACETAMINOPHEN 5-325 MG PO TABS
1.0000 | ORAL_TABLET | ORAL | Status: DC | PRN
  Administered 2013-07-28 – 2013-07-29 (×5): 2 via ORAL
  Filled 2013-07-28 (×5): qty 2

## 2013-07-28 MED ORDER — LIDOCAINE HCL (CARDIAC) 20 MG/ML IV SOLN
INTRAVENOUS | Status: DC | PRN
  Administered 2013-07-28: 100 mg via INTRAVENOUS

## 2013-07-28 MED ORDER — MORPHINE SULFATE 2 MG/ML IJ SOLN
1.0000 mg | INTRAMUSCULAR | Status: DC | PRN
  Administered 2013-07-29: 1 mg via INTRAVENOUS
  Filled 2013-07-28: qty 1

## 2013-07-28 MED ORDER — LIDOCAINE-PRILOCAINE 2.5-2.5 % EX CREA
1.0000 "application " | TOPICAL_CREAM | CUTANEOUS | Status: DC
  Filled 2013-07-28: qty 5

## 2013-07-28 MED ORDER — ASPIRIN 81 MG PO TABS: 81.0000 mg | ORAL_TABLET | Freq: Every day | ORAL | Status: DC

## 2013-07-28 MED ORDER — TAMSULOSIN HCL 0.4 MG PO CAPS
0.4000 mg | ORAL_CAPSULE | Freq: Every day | ORAL | Status: DC
  Administered 2013-07-28: 0.4 mg via ORAL
  Filled 2013-07-28 (×2): qty 1

## 2013-07-28 MED ORDER — GABAPENTIN 300 MG PO CAPS
300.0000 mg | ORAL_CAPSULE | Freq: Two times a day (BID) | ORAL | Status: DC
  Administered 2013-07-28 – 2013-07-29 (×2): 300 mg via ORAL
  Filled 2013-07-28 (×3): qty 1

## 2013-07-28 MED ORDER — HYDROMORPHONE HCL PF 1 MG/ML IJ SOLN
0.2500 mg | INTRAMUSCULAR | Status: DC | PRN
  Administered 2013-07-28 (×2): 0.5 mg via INTRAVENOUS

## 2013-07-28 MED ORDER — ASPIRIN 81 MG PO CHEW
81.0000 mg | CHEWABLE_TABLET | Freq: Every day | ORAL | Status: DC
  Administered 2013-07-28 – 2013-07-29 (×2): 81 mg via ORAL
  Filled 2013-07-28 (×2): qty 1

## 2013-07-28 MED ORDER — DOCUSATE SODIUM 100 MG PO CAPS
100.0000 mg | ORAL_CAPSULE | Freq: Two times a day (BID) | ORAL | Status: DC
  Administered 2013-07-28 – 2013-07-29 (×2): 100 mg via ORAL
  Filled 2013-07-28 (×3): qty 1

## 2013-07-28 MED ORDER — DIPHENHYDRAMINE HCL 25 MG PO CAPS: 25.0000 mg | ORAL_CAPSULE | Freq: Four times a day (QID) | ORAL | Status: DC | PRN

## 2013-07-28 MED ORDER — MIDODRINE HCL 5 MG PO TABS
5.0000 mg | ORAL_TABLET | Freq: Three times a day (TID) | ORAL | Status: DC
Start: 2013-07-28 — End: 2013-07-29
  Administered 2013-07-28 – 2013-07-29 (×2): 5 mg via ORAL
  Filled 2013-07-28 (×5): qty 1

## 2013-07-28 MED ORDER — BUPIVACAINE HCL (PF) 0.25 % IJ SOLN
INTRAMUSCULAR | Status: AC
  Filled 2013-07-28: qty 30

## 2013-07-28 SURGICAL SUPPLY — 51 items
BANDAGE ELASTIC 3 VELCRO ST LF (GAUZE/BANDAGES/DRESSINGS) ×2 IMPLANT
BANDAGE ELASTIC 4 VELCRO ST LF (GAUZE/BANDAGES/DRESSINGS) ×2 IMPLANT
BANDAGE GAUZE ELAST BULKY 4 IN (GAUZE/BANDAGES/DRESSINGS) ×2 IMPLANT
CLOTH BEACON ORANGE TIMEOUT ST (SAFETY) IMPLANT
CORDS BIPOLAR (ELECTRODE) ×2 IMPLANT
COVER SURGICAL LIGHT HANDLE (MISCELLANEOUS) ×2 IMPLANT
CUFF TOURNIQUET SINGLE 18IN (TOURNIQUET CUFF) ×2 IMPLANT
CUFF TOURNIQUET SINGLE 24IN (TOURNIQUET CUFF) IMPLANT
DRAPE SURG 17X23 STRL (DRAPES) ×2 IMPLANT
DRSG ADAPTIC 3X8 NADH LF (GAUZE/BANDAGES/DRESSINGS) ×2 IMPLANT
ELECT NEEDLE TIP 2.8 STRL (NEEDLE) ×2 IMPLANT
EVACUATOR 1/8 PVC DRAIN (DRAIN) ×2 IMPLANT
GAUZE XEROFORM 1X8 LF (GAUZE/BANDAGES/DRESSINGS) ×2 IMPLANT
GLOVE BIO SURGEON STRL SZ8 (GLOVE) ×2 IMPLANT
GLOVE BIOGEL PI IND STRL 7.0 (GLOVE) ×1 IMPLANT
GLOVE BIOGEL PI IND STRL 8.5 (GLOVE) ×1 IMPLANT
GLOVE BIOGEL PI INDICATOR 7.0 (GLOVE) ×1
GLOVE BIOGEL PI INDICATOR 8.5 (GLOVE) ×1
GLOVE ECLIPSE 6.5 STRL STRAW (GLOVE) ×2 IMPLANT
GLOVE SURG ORTHO 8.0 STRL STRW (GLOVE) ×2 IMPLANT
GOWN PREVENTION PLUS XLARGE (GOWN DISPOSABLE) ×6 IMPLANT
GOWN STRL NON-REIN LRG LVL3 (GOWN DISPOSABLE) IMPLANT
KIT BASIN OR (CUSTOM PROCEDURE TRAY) ×2 IMPLANT
KIT ROOM TURNOVER OR (KITS) ×2 IMPLANT
MANIFOLD NEPTUNE II (INSTRUMENTS) ×2 IMPLANT
NEEDLE HYPO 25GX1X1/2 BEV (NEEDLE) ×2 IMPLANT
NS IRRIG 1000ML POUR BTL (IV SOLUTION) ×2 IMPLANT
PACK ORTHO EXTREMITY (CUSTOM PROCEDURE TRAY) ×2 IMPLANT
PAD ARMBOARD 7.5X6 YLW CONV (MISCELLANEOUS) ×4 IMPLANT
PAD CAST 3X4 CTTN HI CHSV (CAST SUPPLIES) ×1 IMPLANT
PAD CAST 4YDX4 CTTN HI CHSV (CAST SUPPLIES) ×1 IMPLANT
PADDING CAST COTTON 3X4 STRL (CAST SUPPLIES) ×1
PADDING CAST COTTON 4X4 STRL (CAST SUPPLIES) ×1
SOAP 2 % CHG 4 OZ (WOUND CARE) ×2 IMPLANT
SPECIMEN JAR MEDIUM (MISCELLANEOUS) ×2 IMPLANT
SPECIMEN JAR SMALL (MISCELLANEOUS) IMPLANT
SPONGE GAUZE 4X4 12PLY (GAUZE/BANDAGES/DRESSINGS) ×2 IMPLANT
SUCTION FRAZIER TIP 10 FR DISP (SUCTIONS) IMPLANT
SUT MNCRL AB 3-0 PS2 18 (SUTURE) ×6 IMPLANT
SUT PROLENE 3 0 PS 2 (SUTURE) ×6 IMPLANT
SUT PROLENE 4 0 PS 2 18 (SUTURE) IMPLANT
SUT VIC AB 2-0 CT1 27 (SUTURE)
SUT VIC AB 2-0 CT1 TAPERPNT 27 (SUTURE) IMPLANT
SUT VIC AB 3-0 FS2 27 (SUTURE) ×6 IMPLANT
SYR CONTROL 10ML LL (SYRINGE) IMPLANT
TOWEL OR 17X24 6PK STRL BLUE (TOWEL DISPOSABLE) ×2 IMPLANT
TOWEL OR 17X26 10 PK STRL BLUE (TOWEL DISPOSABLE) ×4 IMPLANT
TUBE CONNECTING 12X1/4 (SUCTIONS) ×2 IMPLANT
UNDERPAD 30X30 INCONTINENT (UNDERPADS AND DIAPERS) IMPLANT
WATER STERILE IRR 1000ML POUR (IV SOLUTION) IMPLANT
YANKAUER SUCT BULB TIP NO VENT (SUCTIONS) ×2 IMPLANT

## 2013-07-28 NOTE — H&P (Signed)
Nicholas [name] is an 72 y.o. male.   Chief Complaint: Right elbow mass HPI: Right elbow mass  Pt with mass for several years pt would like to have removed Pt having difficult putting on shirt and does not like mass size  Past Medical History  Diagnosis Date  . Coronary artery disease     a. hx of MI 1992; b. 10/2012 NSTEMI (no cath 2/2 GIB);  c. 11/2012 MV: Inf defect w inf and septal HK (favor prior MI), EF 47%;  d. 12/2012 Cath: Severe 3vd, EF 45-50%;  e. 01/2013 CABG x 5 (LIMA->LAD, VG->D1, VG->OM1->OM2, VG->PDA)  . Diabetes mellitus   . Hypertension   . Arthritis   . Carotid artery disease     a. 01/2013 doppler: >80% RICA stenosis, <39% LICA stenosis;  b. 01/2013 R CEA.  . Gastritis 2012 and 09/2011    treated for H Pylori in 12/2011  . Diabetic neuropathy   . Osteoporosis   . COPD (chronic obstructive pulmonary disease)   . Anxiety   . Depression   . ESRD (end stage renal disease)     M-W-Sa  [name]  . Diabetic nephropathy   . GERD (gastroesophageal reflux disease)   . Neuromuscular disorder     peripheral neurotherapy  . Anemia   . A-fib     a. Dx 10/2012, on amio, no anticoagulation 2/2 GIB 10/2012.  [name] GIB (gastrointestinal bleeding)     a. 10/2012, awaiting colonoscopy.    Past Surgical History  Procedure Laterality Date  . Coronary stent placement    . Hip surgery      both hips  . Av fistula placement      Left arm  . Cardiac catheterization    . Joint replacement Bilateral   . Eye surgery Bilateral     cararacts  . Coronary artery bypass graft N/A 02/03/2013    Procedure: CORONARY ARTERY BYPASS GRAFTING (CABG);  Surgeon: Loreli Slot, MD;  Location: [facility] OR;  Service: Open Heart Surgery;  Laterality: N/A;  . Endarterectomy N/A 02/03/2013    Procedure: ENDARTERECTOMY CAROTID;  Surgeon: Pryor Ochoa, MD;  Location: [facility] OR;  Service: Vascular;  Laterality: N/A;    Family History  Problem Relation Age of Onset  . Heart attack Father 59  . Heart  disease Father   . Stroke Mother   . Hypertension Mother   . Other Mother     varicose veins  . Stroke Maternal Grandmother   . Hypertension Brother    Social History:  reports that he quit smoking about 22 years ago. His smoking use included Cigarettes. He started smoking about 47 years ago. He has a 25 pack-year smoking history. He has never used smokeless tobacco. He reports that he drinks alcohol. He reports that he does not use illicit drugs.  Allergies:  Allergies  Allergen Reactions  . Statins Other (See Comments)    Muscle weakness     Medications Prior to Admission  Medication Sig Dispense Refill  . acetaminophen (TYLENOL) 500 MG tablet Take 500 mg by mouth every 4 (four) hours as needed for pain.      [name] allopurinol (ZYLOPRIM) 100 MG tablet Take 100 mg by mouth 2 (two) times daily.       [name] amiodarone (PACERONE) 200 MG tablet Take 200 mg by mouth daily.      [name] aspirin 81 MG tablet Take 81 mg by mouth daily.      [name] aspirin EC 325  MG EC tablet Take 1 tablet (325 mg total) by mouth daily.  30 tablet  0  . darbepoetin (ARANESP) 100 MCG/0.5ML SOLN Inject 0.5 mLs (100 mcg total) into the vein every Friday with hemodialysis.  4.2 mL    . docusate sodium (COLACE) 100 MG capsule Take 100 mg by mouth 2 (two) times daily.      . fish oil-omega-3 fatty acids 1000 MG capsule Take 1 g by mouth 2 (two) times daily.       [name] gabapentin (NEURONTIN) 300 MG capsule Take 1 capsule (300 mg total) by mouth 2 (two) times daily.      . insulin aspart (NOVOLOG) 100 UNIT/ML injection Inject 0-8 Units into the skin 3 (three) times daily before meals. 0-99= no insulin; 100-150= 2 units; 151-200=4 units; 201-250=6 units; greater than 250= 8 units.      . insulin glargine (LANTUS) 100 UNIT/ML injection Inject 15 Units into the skin at bedtime.      [name] ipratropium (ATROVENT) 0.06 % nasal spray Place 1 spray into the nose 3 (three) times daily.      [name] lidocaine-prilocaine (EMLA) cream Apply 1 application  topically every Monday, Wednesday, and Friday.       . midodrine (PROAMATINE) 5 MG tablet Take 5 mg by mouth 3 (three) times daily with meals.      . Multiple Vitamins-Minerals (MULTIVITAMIN WITH MINERALS) tablet Take 1 tablet by mouth at bedtime.       . multivitamin (RENA-VIT) TABS tablet Take 1 tablet by mouth at bedtime.       . pantoprazole (PROTONIX) 40 MG tablet Take 40 mg by mouth daily.      [name] senna-docusate (SENOKOT-S) 8.6-50 MG per tablet Take 1 tablet by mouth daily as needed for constipation.      . sevelamer carbonate (RENVELA) 800 MG tablet Take 800 mg by mouth 3 (three) times daily with meals.      . sodium chloride 0.9 % SOLN 100 mL with ferric gluconate 12.5 MG/ML SOLN 62.5 mg Inject 62.5 mg into the vein every Wednesday with hemodialysis.      [name] tamsulosin (FLOMAX) 0.4 MG CAPS Take 0.4 mg by mouth at bedtime.      . traMADol (ULTRAM) 50 MG tablet Take 50 mg by mouth every 8 (eight) hours as needed for pain.      [name] zolpidem (AMBIEN) 5 MG tablet Take 1 tablet (5 mg total) by mouth at bedtime as needed for sleep.  30 tablet  0    Results for orders placed during the hospital encounter of 07/28/13 (from the past 48 hour(s))  GLUCOSE, CAPILLARY     Status: Abnormal   Collection Time    07/28/13 10:56 AM      Result Value Range   Glucose-Capillary 139 (*) 70 - 99 mg/dL  POCT I-STAT 4, (NA,K, GLUC, HGB,HCT)     Status: Abnormal   Collection Time    07/28/13 11:31 AM      Result Value Range   Sodium 135  135 - 145 mEq/L   Potassium 4.0  3.5 - 5.1 mEq/L   Glucose, Bld 134 (*) 70 - 99 mg/dL   HCT 40.9  81.1 - 91.4 %   Hemoglobin 13.3  13.0 - 17.0 g/dL   No results found.  ROS NO recent illnesses hospitalizations  Blood pressure 111/61, pulse 89, temperature 98.8 F (37.1 C), temperature source Oral, resp. rate 18, SpO2 95.00%. Physical Exam  General Appearance:  Alert, cooperative,  no distress, appears stated age  Head:  Normocephalic, without obvious abnormality,  atraumatic  Eyes:  Pupils equal, conjunctiva/corneas clear,         Throat: Lips, mucosa, and tongue normal; teeth and gums normal  Neck: No visible masses     Lungs:   respirations unlabored  Chest Wall:  No tenderness or deformity  Heart:  Regular rate and rhythm,  Abdomen:   Soft, non-tender,         Extremities: Right elbow: large softball mass over olecranon bursa, Fingers warm well perfused able to extend thumb and digits   Pulses: 2+ and symmetric  Skin: Skin color, texture, turgor normal, no rashes or lesions     Neurologic: Normal    Assessment/Plan Right elbow olecranon bursitis, large softball mass  Right elbow olecranon bursectomy and mass removal  R/B/A DISCUSSED WITH PT IN OFFICE.  PT VOICED UNDERSTANDING OF PLAN CONSENT SIGNED DAY OF SURGERY PT SEEN AND EXAMINED PRIOR TO OPERATIVE PROCEDURE/DAY OF SURGERY SITE MARKED. QUESTIONS ANSWERED WILL REMAIN AN INPATIENT FOLLOWING SURGERY  Sharma Covert 07/28/2013, 1:20 PM

## 2013-07-28 NOTE — Anesthesia Preprocedure Evaluation (Addendum)
Anesthesia Evaluation  Patient identified by MRN, date of birth, ID band Patient awake    Reviewed: Allergy & Precautions, H&P , NPO status , Patient's Chart, lab work & pertinent test results, reviewed documented beta blocker date and time   Airway Mallampati: II TM Distance: >3 FB Neck ROM: Full    Dental  (+) Dental Advisory Given, Missing and Poor Dentition   Pulmonary COPDformer smoker,          Cardiovascular + CAD, + Cardiac Stents, + CABG and + Peripheral Vascular Disease + dysrhythmias  Pt actually takes mitodrine to support BP. CABG 01/2013 with Rt CEA   Neuro/Psych Anxiety Depression  Neuromuscular disease    GI/Hepatic GERD-  Controlled and Medicated,Protonix this morning. HD M-Wed-Fr   Endo/Other  diabetes, Well Controlled, Type 2, Insulin Dependent and Oral Hypoglycemic Agents  Renal/GU ESRF and DialysisRenal disease     Musculoskeletal   Abdominal   Peds  Hematology  (+) anemia ,   Anesthesia Other Findings Many teeth are worn to the gum or broken; pt states that he cannot afford dentures yet.  Reproductive/Obstetrics                         Anesthesia Physical Anesthesia Plan  ASA: III  Anesthesia Plan: General   Post-op Pain Management:    Induction: Intravenous  Airway Management Planned: LMA and Oral ETT  Additional Equipment:   Intra-op Plan:   Post-operative Plan: Extubation in OR  Informed Consent: I have reviewed the patients History and Physical, chart, labs and discussed the procedure including the risks, benefits and alternatives for the proposed anesthesia with the patient or authorized representative who has indicated his/her understanding and acceptance.     Plan Discussed with:   Anesthesia Plan Comments:         Anesthesia Quick Evaluation

## 2013-07-28 NOTE — Transfer of Care (Signed)
Immediate Anesthesia Transfer of Care Note  Patient: Nicholas [name]  Procedure(s) Performed: Procedure(s): RIGHT ELBOW OLECRANON BURSECTOMY ADVANCEMENT CLOSURE  (Right)  Patient Location: PACU  Anesthesia Type:General  Level of Consciousness: awake, alert , oriented and patient cooperative  Airway & Oxygen Therapy: Patient Spontanous Breathing and Patient connected to nasal cannula oxygen  Post-op Assessment: Report given to PACU RN, Post -op Vital signs reviewed and stable and Patient moving all extremities  Post vital signs: Reviewed and stable  Complications: No apparent anesthesia complications

## 2013-07-28 NOTE — Anesthesia Postprocedure Evaluation (Signed)
  Anesthesia Post-op Note  Patient: Nicholas [name]  Procedure(s) Performed: Procedure(s): RIGHT ELBOW OLECRANON BURSECTOMY ADVANCEMENT CLOSURE  (Right)  Patient Location: PACU  Anesthesia Type:General  Level of Consciousness: awake  Airway and Oxygen Therapy: Patient Spontanous Breathing  Post-op Pain: mild  Post-op Assessment: Post-op Vital signs reviewed  Post-op Vital Signs: stable  Complications: No apparent anesthesia complications

## 2013-07-28 NOTE — Brief Op Note (Signed)
07/28/2013  1:27 PM  PATIENT:  Nicholas [name]  72 y.o. male  PRE-OPERATIVE DIAGNOSIS:  RIGHT ELBOW OLECRANON BURSITIS   POST-OPERATIVE DIAGNOSIS:  SAME  PROCEDURE:  Procedure(s): RIGHT ELBOW OLECRANON BURSECTOMY ADVANCEMENT CLOSURE  (Right)  SURGEON:  Surgeon(s) and Role:    * Sharma Covert, MD - Primary  PHYSICIAN ASSISTANT:NONE   ASSISTANTS: none   ANESTHESIA:   general  EBL:     BLOOD ADMINISTERED:none  DRAINS: none   LOCAL MEDICATIONS USED:  MARCAINE     SPECIMEN:  No Specimen  DISPOSITION OF SPECIMEN:  N/A  COUNTS:  YES  TOURNIQUET:    DICTATION: .[id number]  PLAN OF CARE: Admit for overnight observation  PATIENT DISPOSITION:  PACU - hemodynamically stable.   Delay start of Pharmacological VTE agent (>24hrs) due to surgical blood loss or risk of bleeding: not applicable

## 2013-07-28 NOTE — Preoperative (Signed)
Beta Blockers   Reason not to administer Beta Blockers:Not Applicable 

## 2013-07-28 NOTE — Anesthesia Procedure Notes (Signed)
Procedure Name: LMA Insertion Date/Time: 07/28/2013 1:37 PM Performed by: Marni Griffon Pre-anesthesia Checklist: Emergency Drugs available, Patient identified, Suction available and Patient being monitored Patient Re-evaluated:Patient Re-evaluated prior to inductionOxygen Delivery Method: Circle system utilized Preoxygenation: Pre-oxygenation with 100% oxygen Intubation Type: IV induction Ventilation: Mask ventilation without difficulty LMA: LMA inserted LMA Size: 5.0 Number of attempts: 2 Placement Confirmation: breath sounds checked- equal and bilateral and positive ETCO2 Tube secured with: taped across chin and cheeks; gauze roll b/t teeth. Dental Injury: Teeth and Oropharynx as per pre-operative assessment

## 2013-07-29 ENCOUNTER — Encounter (HOSPITAL_COMMUNITY): Payer: Self-pay | Admitting: Orthopedic Surgery

## 2013-07-29 LAB — GLUCOSE, CAPILLARY
Glucose-Capillary: 92 mg/dL (ref 70–99)
Glucose-Capillary: 120 mg/dL — ABNORMAL HIGH (ref 70–99)

## 2013-07-29 NOTE — Op Note (Signed)
Nicholas [name], Nicholas [name] NO.:  [id number]  MEDICAL RECORD NO.:  [id number]  LOCATION:  5N29C                        FACILITY:  [facility]  PHYSICIAN:  Madelynn Done, MD  DATE OF BIRTH:  24-Oct-1940  DATE OF PROCEDURE:  07/28/2013 DATE OF DISCHARGE:                              OPERATIVE REPORT   PREOPERATIVE DIAGNOSIS:  Left elbow mass greater than 15 cm.  POSTOPERATIVE DIAGNOSIS:  Left elbow mass greater than 15 cm.  ATTENDING PHYSICIAN:  Madelynn Done, MD, who scrubbed and present for the entire procedure.  ASSISTANT SURGEON:  None.  ANESTHESIA:  General via LMA.  TOURNIQUET TIME:  Less than 30 minutes, 250 mmHg.  SURGICAL PROCEDURE: 1. Left elbow excision of soft tissue, elbow region subfascial 5 cm or     greater, 15-cm mass. 2. Right elbow olecranon bursectomy. 3. Repair complex wound closure of 15-cm wound.  SURGICAL INDICATIONS:  Nicholas [name] is a right-hand dominant gentleman who had an enlarging mass over the posterior aspect of the elbow that was greater than 15 cm in size.  The patient was having difficulty getting a shirt on and was concerned about the enlarging mass and liked to undergo the above procedure.  Risks, benefits, and alternatives were discussed in detail with the patient and signed informed consent was obtained.  Risks include, but not limited to bleeding, infection, damage to nearby nerves, arteries, tendons, loss of motion of wrists and digits, recurrence of mass, soft tissue, fluid collection, and need for further surgical intervention.  DESCRIPTION OF PROCEDURE:  The patient was properly identified in the preop holding area and marked with a permanent marker made on the left elbow to indicate correct operative site.  The patient was then brought back to the room and placed supine on the anesthesia table.  General anesthesia was administered.  The patient tolerated it well.  A well- padded tourniquet was placed on the right  brachium sealed with 1000 drape.  Left upper extremity was then prepped and draped in normal sterile fashion.  Time-out was called.  Correct site was identified, and procedure then begun.  Attention then turned to left elbow where a curvilinear incision made directly over the posterior aspect of the elbow after the arm was then brought over the chest.  Limb was then elevated.  Tourniquet insufflated.  Dissection was then carried down through the skin and subcutaneous tissue.  The mass was then circumferentially dissected.  The large mass which measured 15 x 7 cm was then dissected all the way around carefully elevating the skin flaps with a needle point by needle point cautery.  Hemostasis was then obtained throughout and protection of the skin flaps was done meticulously to protect the skin with removal of the mass.  Portion of the olecranon bursectomy was also completed in addition to removal of the mass.  The wound was then thoroughly irrigated.  Tourniquet was then deflated.  Hemostasis was then obtained.  The patient essentially had a tissue expander in his elbow and therefore a complex wound closure was then carried out.  The skin was then advanced along the ulnar column closing over the top of the wound and  this was then tacked down with Monocryl suture after hemostasis was then obtained.  Following this, skin was then excised along the radial margin.  The subcutaneous tissues were then closed with 3-0 Vicryl.  The skin was then closed with a running 3-0 Prolene closed over Hemovac drain.  Xeroform dressing and sterile compressive bandage then applied.  The patient was then placed in a long-arm splint, extubated, and taken to recovery room in good condition.  INTRAOPERATIVE SPECIMENS:  Elbow mass to Pathology.  POSTPROCEDURE PLAN:  The patient was admitted overnight for IV antibiotics and pain control.  Drain removed in the morning.  Continue with the current splint for a total  of 10 days.  Wound check and eval in 10 days, and then increase his mobility and use depending on the wound healing.  May have a prolonged period of immobilization to protect the wound.  Again, the concern will be for fluid accumulation given the large size and the location of the mass.     Madelynn Done, MD     FWO/MEDQ  D:  07/28/2013  T:  07/29/2013  Job:  [id number]

## 2013-07-29 NOTE — Progress Notes (Signed)
Utilization review completed.  

## 2013-07-29 NOTE — Progress Notes (Signed)
Patient can not get a ride to dialysis as outpatient. I called MD he stated to call inpatient dialysis for orders to have dialysis done today.  Talked to [place] forwarded message to PA.  PA called, Claud Kelp, PA and said patient's K+ is okay so he can go to dialysis tomorrow, Saturday 07/30/2013.  Forwarded information to patient.

## 2013-07-29 NOTE — Discharge Summary (Signed)
Physician Discharge Summary  Patient ID: Nicholas [name] MRN: [id number] DOB/AGE: Feb 08, 1941 72 y.o.  Admit date: 07/28/2013 Discharge date: 07/29/2013  Admission Diagnoses: RIGHT ELBOW OLECRANON BURSITIS  Past Medical History  Diagnosis Date  . Coronary artery disease     a. hx of MI 1992; b. 10/2012 NSTEMI (no cath 2/2 GIB);  c. 11/2012 MV: Inf defect w inf and septal HK (favor prior MI), EF 47%;  d. 12/2012 Cath: Severe 3vd, EF 45-50%;  e. 01/2013 CABG x 5 (LIMA->LAD, VG->D1, VG->OM1->OM2, VG->PDA)  . Diabetes mellitus   . Hypertension   . Arthritis   . Carotid artery disease     a. 01/2013 doppler: >80% RICA stenosis, <39% LICA stenosis;  b. 01/2013 R CEA.  . Gastritis 2012 and 09/2011    treated for H Pylori in 12/2011  . Diabetic neuropathy   . Osteoporosis   . COPD (chronic obstructive pulmonary disease)   . Anxiety   . Depression   . ESRD (end stage renal disease)     M-W-Sa  [name]  . Diabetic nephropathy   . GERD (gastroesophageal reflux disease)   . Neuromuscular disorder     peripheral neurotherapy  . Anemia   . A-fib     a. Dx 10/2012, on amio, no anticoagulation 2/2 GIB 10/2012.  [name] GIB (gastrointestinal bleeding)     a. 10/2012, awaiting colonoscopy.    Discharge Diagnoses:  Active Problems:   Bursitis of elbow   Surgeries: Procedure(s): RIGHT ELBOW OLECRANON BURSECTOMY ADVANCEMENT CLOSURE  on 07/28/2013    Consultants:    Discharged Condition: Improved  Hospital Course: Nicholas [name] is an 72 y.o. male who was admitted 07/28/2013 with a chief complaint of No chief complaint on file. , and found to have a diagnosis of RIGHT ELBOW OLECRANON BURSITIS .  They were brought to the operating room on 07/28/2013 and underwent Procedure(s): RIGHT ELBOW OLECRANON BURSECTOMY ADVANCEMENT CLOSURE .    They were given perioperative antibiotics: Anti-infectives   Start     Dose/Rate Route Frequency Ordered Stop   07/29/13 0200  ceFAZolin (ANCEF) IVPB 1 g/50 mL  premix     1 g 100 mL/hr over 30 Minutes Intravenous 3 times per day 07/28/13 1722     07/28/13 1800  ceFAZolin (ANCEF) IVPB 1 g/50 mL premix     1 g 100 mL/hr over 30 Minutes Intravenous NOW 07/28/13 1722 07/28/13 1843   07/28/13 1100  ceFAZolin (ANCEF) IVPB 2 g/50 mL premix  Status:  Discontinued     2 g 100 mL/hr over 30 Minutes Intravenous On call to O.R. 07/28/13 1050 07/28/13 1711    .  They were given sequential compression devices, early ambulation, and Other (comment)ambulation for DVT prophylaxis.  Recent vital signs: Patient Vitals for the past 24 hrs:  BP Temp Temp src Pulse Resp SpO2  07/29/13 0521 115/67 mmHg 97.6 F (36.4 C) - 70 16 100 %  07/28/13 2041 90/60 mmHg 97.5 F (36.4 C) - 73 16 95 %  07/28/13 1713 126/66 mmHg 97.6 F (36.4 C) - 73 14 96 %  07/28/13 1645 163/64 mmHg 96.8 F (36 C) - 70 15 98 %  07/28/13 1615 161/61 mmHg - - 70 13 99 %  07/28/13 1600 172/71 mmHg - - 72 7 100 %  07/28/13 1545 178/70 mmHg - - 73 13 98 %  07/28/13 1530 167/77 mmHg - - 75 16 98 %  07/28/13 1520 157/59 mmHg 97.2 F (36.2 C) - 78  13 97 %  07/28/13 1049 111/61 mmHg 98.8 F (37.1 C) Oral 89 18 95 %  .  Recent laboratory studies: No results found.  Discharge Medications:     Medication List         acetaminophen 500 MG tablet  Commonly known as:  TYLENOL  Take 500 mg by mouth every 4 (four) hours as needed for pain.     allopurinol 100 MG tablet  Commonly known as:  ZYLOPRIM  Take 100 mg by mouth 2 (two) times daily.     amiodarone 200 MG tablet  Commonly known as:  PACERONE  Take 200 mg by mouth daily.     aspirin 325 MG EC tablet  Take 1 tablet (325 mg total) by mouth daily.     aspirin 81 MG tablet  Take 81 mg by mouth daily.     darbepoetin 100 MCG/0.5ML Soln injection  Commonly known as:  ARANESP  Inject 0.5 mLs (100 mcg total) into the vein every Friday with hemodialysis.     docusate sodium 100 MG capsule  Commonly known as:  COLACE  Take 1  capsule (100 mg total) by mouth 2 (two) times daily.     docusate sodium 100 MG capsule  Commonly known as:  COLACE  Take 100 mg by mouth 2 (two) times daily.     fish oil-omega-3 fatty acids 1000 MG capsule  Take 1 g by mouth 2 (two) times daily.     gabapentin 300 MG capsule  Commonly known as:  NEURONTIN  Take 1 capsule (300 mg total) by mouth 2 (two) times daily.     insulin aspart 100 UNIT/ML injection  Commonly known as:  novoLOG  Inject 0-8 Units into the skin 3 (three) times daily before meals. 0-99= no insulin; 100-150= 2 units; 151-200=4 units; 201-250=6 units; greater than 250= 8 units.     insulin glargine 100 UNIT/ML injection  Commonly known as:  LANTUS  Inject 15 Units into the skin at bedtime.     ipratropium 0.06 % nasal spray  Commonly known as:  ATROVENT  Place 1 spray into the nose 3 (three) times daily.     lidocaine-prilocaine cream  Commonly known as:  EMLA  Apply 1 application topically every Monday, Wednesday, and Friday.     midodrine 5 MG tablet  Commonly known as:  PROAMATINE  Take 5 mg by mouth 3 (three) times daily with meals.     multivitamin Tabs tablet  Take 1 tablet by mouth at bedtime.     multivitamin with minerals tablet  Take 1 tablet by mouth at bedtime.     oxyCODONE 5 MG immediate release tablet  Commonly known as:  ROXICODONE  Take 1 tablet (5 mg total) by mouth every 4 (four) hours as needed for severe pain.     pantoprazole 40 MG tablet  Commonly known as:  PROTONIX  Take 40 mg by mouth daily.     senna-docusate 8.6-50 MG per tablet  Commonly known as:  Senokot-S  Take 1 tablet by mouth daily as needed for constipation.     sevelamer carbonate 800 MG tablet  Commonly known as:  RENVELA  Take 800 mg by mouth 3 (three) times daily with meals.     sodium chloride 0.9 % SOLN 100 mL with ferric gluconate 12.5 MG/ML SOLN 62.5 mg  Inject 62.5 mg into the vein every Wednesday with hemodialysis.     tamsulosin 0.4 MG Caps  capsule  Commonly known  as:  FLOMAX  Take 0.4 mg by mouth at bedtime.     traMADol 50 MG tablet  Commonly known as:  ULTRAM  Take 50 mg by mouth every 8 (eight) hours as needed for pain.     zolpidem 5 MG tablet  Commonly known as:  AMBIEN  Take 1 tablet (5 mg total) by mouth at bedtime as needed for sleep.        Diagnostic Studies: Dg Chest 2 View  07/21/2013   CLINICAL DATA:  History of CAD, diabetes, hypertension and COPD, AFib. Right elbow olecranon bursectomy advancement closure.  EXAM: CHEST  2 VIEW  COMPARISON:  03/29/2013  FINDINGS: The patient has had median sternotomy and CABG. The heart is mildly enlarged. There is elevation of left hemidiaphragm. Left lung base atelectasis or scar is noted. There are no focal consolidations or pleural effusions. No pulmonary edema. Note is made of chronic changes in both shoulders.  IMPRESSION: 1. Cardiomegaly without pulmonary edema. 2. Stable changes at the left lung base.   Electronically Signed   By: Rosalie Gums M.D.   On: 07/21/2013 14:28    They benefited maximally from their hospital stay and there were no complications.     Disposition: 03-Skilled Nursing Facility      Future Appointments Provider Department Dept Phone   09/20/2013 1:00 PM [facility] [place] [facility] [address] [phone]   09/20/2013 2:00 PM Pryor Ochoa, MD [facility] [phone]     Follow-up Information   Follow up with Sharma Covert, MD.   Specialty:  Orthopedic Surgery   Contact information:   [address] [place] [address] [phone]       PT SEEN/EXAMINED THIS AM DRAIN REMOVED PT READY FOR D/C HOME F/U IN OFFICE IN 10 DAYS Signed: Sharma Covert 07/29/2013, 6:49 AM

## 2013-07-29 NOTE — Progress Notes (Signed)
Patient is discharged but unable to make it to his outpatient hemodialysis session today due to transportation issues. K+ is within normal limits.  [facility] has been contacted and a makeup treatment has been scheduled for tomorrow.

## 2013-08-06 ENCOUNTER — Emergency Department (HOSPITAL_COMMUNITY): Payer: Medicare Other

## 2013-08-06 ENCOUNTER — Observation Stay (HOSPITAL_COMMUNITY)
Admission: EM | Admit: 2013-08-06 | Discharge: 2013-08-08 | Disposition: A | Discharge status: Skilled Nursing Facility | Payer: Medicare Other | Attending: Internal Medicine | Admitting: Internal Medicine

## 2013-08-06 ENCOUNTER — Encounter (HOSPITAL_COMMUNITY): Payer: Self-pay | Admitting: Emergency Medicine

## 2013-08-06 DIAGNOSIS — N186 End stage renal disease: Secondary | ICD-10-CM | POA: Insufficient documentation

## 2013-08-06 DIAGNOSIS — M7021 Olecranon bursitis, right elbow: Secondary | ICD-10-CM

## 2013-08-06 DIAGNOSIS — Z992 Dependence on renal dialysis: Secondary | ICD-10-CM | POA: Insufficient documentation

## 2013-08-06 DIAGNOSIS — I2510 Atherosclerotic heart disease of native coronary artery without angina pectoris: Secondary | ICD-10-CM | POA: Insufficient documentation

## 2013-08-06 DIAGNOSIS — Z91199 Patient's noncompliance with other medical treatment and regimen due to unspecified reason: Secondary | ICD-10-CM | POA: Insufficient documentation

## 2013-08-06 DIAGNOSIS — I5022 Chronic systolic (congestive) heart failure: Secondary | ICD-10-CM | POA: Insufficient documentation

## 2013-08-06 DIAGNOSIS — E1149 Type 2 diabetes mellitus with other diabetic neurological complication: Secondary | ICD-10-CM | POA: Insufficient documentation

## 2013-08-06 DIAGNOSIS — Y92009 Unspecified place in unspecified non-institutional (private) residence as the place of occurrence of the external cause: Secondary | ICD-10-CM | POA: Insufficient documentation

## 2013-08-06 DIAGNOSIS — Z79899 Other long term (current) drug therapy: Secondary | ICD-10-CM | POA: Insufficient documentation

## 2013-08-06 DIAGNOSIS — R55 Syncope and collapse: Principal | ICD-10-CM | POA: Diagnosis present

## 2013-08-06 DIAGNOSIS — D72829 Elevated white blood cell count, unspecified: Secondary | ICD-10-CM | POA: Insufficient documentation

## 2013-08-06 DIAGNOSIS — I4891 Unspecified atrial fibrillation: Secondary | ICD-10-CM | POA: Insufficient documentation

## 2013-08-06 DIAGNOSIS — Z7982 Long term (current) use of aspirin: Secondary | ICD-10-CM | POA: Insufficient documentation

## 2013-08-06 DIAGNOSIS — E119 Type 2 diabetes mellitus without complications: Secondary | ICD-10-CM

## 2013-08-06 DIAGNOSIS — I5021 Acute systolic (congestive) heart failure: Secondary | ICD-10-CM

## 2013-08-06 DIAGNOSIS — K922 Gastrointestinal hemorrhage, unspecified: Secondary | ICD-10-CM

## 2013-08-06 DIAGNOSIS — D649 Anemia, unspecified: Secondary | ICD-10-CM | POA: Insufficient documentation

## 2013-08-06 DIAGNOSIS — E1142 Type 2 diabetes mellitus with diabetic polyneuropathy: Secondary | ICD-10-CM | POA: Insufficient documentation

## 2013-08-06 DIAGNOSIS — R0902 Hypoxemia: Secondary | ICD-10-CM | POA: Diagnosis present

## 2013-08-06 DIAGNOSIS — I120 Hypertensive chronic kidney disease with stage 5 chronic kidney disease or end stage renal disease: Secondary | ICD-10-CM | POA: Insufficient documentation

## 2013-08-06 DIAGNOSIS — I249 Acute ischemic heart disease, unspecified: Secondary | ICD-10-CM

## 2013-08-06 DIAGNOSIS — J4489 Other specified chronic obstructive pulmonary disease: Secondary | ICD-10-CM | POA: Insufficient documentation

## 2013-08-06 DIAGNOSIS — J449 Chronic obstructive pulmonary disease, unspecified: Secondary | ICD-10-CM | POA: Insufficient documentation

## 2013-08-06 DIAGNOSIS — M7030 Other bursitis of elbow, unspecified elbow: Secondary | ICD-10-CM | POA: Diagnosis present

## 2013-08-06 DIAGNOSIS — M545 Low back pain, unspecified: Secondary | ICD-10-CM | POA: Insufficient documentation

## 2013-08-06 DIAGNOSIS — I509 Heart failure, unspecified: Secondary | ICD-10-CM | POA: Insufficient documentation

## 2013-08-06 DIAGNOSIS — W19XXXA Unspecified fall, initial encounter: Secondary | ICD-10-CM | POA: Insufficient documentation

## 2013-08-06 DIAGNOSIS — J9601 Acute respiratory failure with hypoxia: Secondary | ICD-10-CM

## 2013-08-06 DIAGNOSIS — D62 Acute posthemorrhagic anemia: Secondary | ICD-10-CM

## 2013-08-06 DIAGNOSIS — E8770 Fluid overload, unspecified: Secondary | ICD-10-CM

## 2013-08-06 DIAGNOSIS — E1129 Type 2 diabetes mellitus with other diabetic kidney complication: Secondary | ICD-10-CM | POA: Insufficient documentation

## 2013-08-06 DIAGNOSIS — J189 Pneumonia, unspecified organism: Secondary | ICD-10-CM

## 2013-08-06 DIAGNOSIS — Z87891 Personal history of nicotine dependence: Secondary | ICD-10-CM | POA: Insufficient documentation

## 2013-08-06 DIAGNOSIS — Z9119 Patient's noncompliance with other medical treatment and regimen: Secondary | ICD-10-CM | POA: Insufficient documentation

## 2013-08-06 DIAGNOSIS — M7022 Olecranon bursitis, left elbow: Secondary | ICD-10-CM

## 2013-08-06 DIAGNOSIS — Z794 Long term (current) use of insulin: Secondary | ICD-10-CM | POA: Insufficient documentation

## 2013-08-06 DIAGNOSIS — Z9889 Other specified postprocedural states: Secondary | ICD-10-CM

## 2013-08-06 DIAGNOSIS — M7020 Olecranon bursitis, unspecified elbow: Secondary | ICD-10-CM | POA: Insufficient documentation

## 2013-08-06 DIAGNOSIS — I255 Ischemic cardiomyopathy: Secondary | ICD-10-CM

## 2013-08-06 DIAGNOSIS — I480 Paroxysmal atrial fibrillation: Secondary | ICD-10-CM

## 2013-08-06 DIAGNOSIS — M549 Dorsalgia, unspecified: Secondary | ICD-10-CM

## 2013-08-06 DIAGNOSIS — I5020 Unspecified systolic (congestive) heart failure: Secondary | ICD-10-CM

## 2013-08-06 DIAGNOSIS — R739 Hyperglycemia, unspecified: Secondary | ICD-10-CM

## 2013-08-06 DIAGNOSIS — Z951 Presence of aortocoronary bypass graft: Secondary | ICD-10-CM | POA: Insufficient documentation

## 2013-08-06 LAB — POCT I-STAT, CHEM 8
BUN: 26 mg/dL — ABNORMAL HIGH (ref 6–23)
Chloride: 96 mEq/L (ref 96–112)
Creatinine, Ser: 4.6 mg/dL — ABNORMAL HIGH (ref 0.50–1.35)
Glucose, Bld: 148 mg/dL — ABNORMAL HIGH (ref 70–99)
Sodium: 136 mEq/L (ref 135–145)
TCO2: 30 mmol/L (ref 0–100)

## 2013-08-06 LAB — GLUCOSE, CAPILLARY

## 2013-08-06 LAB — CBC
MCH: 32.6 pg (ref 26.0–34.0)
MCHC: 31.1 g/dL (ref 30.0–36.0)
Platelets: 295 10*3/uL (ref 150–400)
RBC: 3.44 MIL/uL — ABNORMAL LOW (ref 4.22–5.81)
RDW: 15.3 % (ref 11.5–15.5)

## 2013-08-06 MED ORDER — HYDROMORPHONE HCL PF 1 MG/ML IJ SOLN
0.5000 mg | INTRAMUSCULAR | Status: DC | PRN
  Administered 2013-08-06 – 2013-08-08 (×8): 1 mg via INTRAVENOUS
  Filled 2013-08-06 (×7): qty 1

## 2013-08-06 MED ORDER — OXYCODONE-ACETAMINOPHEN 5-325 MG PO TABS
1.0000 | ORAL_TABLET | Freq: Once | ORAL | Status: AC
  Administered 2013-08-06: 1 via ORAL
  Filled 2013-08-06: qty 1

## 2013-08-06 MED ORDER — IPRATROPIUM BROMIDE 0.06 % NA SOLN
1.0000 | Freq: Three times a day (TID) | NASAL | Status: DC
  Administered 2013-08-07 – 2013-08-08 (×5): 1 via NASAL
  Filled 2013-08-06: qty 15

## 2013-08-06 MED ORDER — RENA-VITE PO TABS
1.0000 | ORAL_TABLET | Freq: Every day | ORAL | Status: DC
  Administered 2013-08-07 (×2): 1 via ORAL
  Filled 2013-08-06 (×3): qty 1

## 2013-08-06 MED ORDER — SODIUM CHLORIDE 0.9 % IJ SOLN
3.0000 mL | Freq: Two times a day (BID) | INTRAMUSCULAR | Status: DC
  Administered 2013-08-07: 3 mL via INTRAVENOUS

## 2013-08-06 MED ORDER — SODIUM CHLORIDE 0.9 % IV SOLN: 250.0000 mL | INTRAVENOUS | Status: DC | PRN

## 2013-08-06 MED ORDER — INSULIN ASPART PROT & ASPART (70-30 MIX) 100 UNIT/ML ~~LOC~~ SUSP
2.0000 [IU] | Freq: Three times a day (TID) | SUBCUTANEOUS | Status: DC
  Administered 2013-08-07 (×3): 2 [IU] via SUBCUTANEOUS
  Filled 2013-08-06: qty 10

## 2013-08-06 MED ORDER — ASPIRIN 81 MG PO CHEW
324.0000 mg | CHEWABLE_TABLET | Freq: Every day | ORAL | Status: DC
  Administered 2013-08-07 – 2013-08-08 (×2): 324 mg via ORAL
  Filled 2013-08-06 (×2): qty 4

## 2013-08-06 MED ORDER — IOHEXOL 350 MG/ML SOLN
60.0000 mL | Freq: Once | INTRAVENOUS | Status: AC | PRN
  Administered 2013-08-06: 60 mL via INTRAVENOUS

## 2013-08-06 MED ORDER — PANTOPRAZOLE SODIUM 40 MG PO TBEC
40.0000 mg | DELAYED_RELEASE_TABLET | Freq: Every day | ORAL | Status: DC
  Administered 2013-08-07 – 2013-08-08 (×2): 40 mg via ORAL
  Filled 2013-08-06: qty 1

## 2013-08-06 MED ORDER — ZOLPIDEM TARTRATE 5 MG PO TABS
5.0000 mg | ORAL_TABLET | Freq: Every evening | ORAL | Status: DC | PRN
  Administered 2013-08-07: 5 mg via ORAL
  Filled 2013-08-06 (×2): qty 1

## 2013-08-06 MED ORDER — FENTANYL CITRATE 0.05 MG/ML IJ SOLN: 25.0000 ug | Freq: Once | INTRAMUSCULAR | Status: DC

## 2013-08-06 MED ORDER — INSULIN ASPART 100 UNIT/ML ~~LOC~~ SOLN
0.0000 [IU] | Freq: Three times a day (TID) | SUBCUTANEOUS | Status: DC
  Administered 2013-08-08: 1 [IU] via SUBCUTANEOUS

## 2013-08-06 MED ORDER — ONDANSETRON HCL 4 MG PO TABS
4.0000 mg | ORAL_TABLET | Freq: Four times a day (QID) | ORAL | Status: DC | PRN
  Administered 2013-08-08: 4 mg via ORAL
  Filled 2013-08-06: qty 1

## 2013-08-06 MED ORDER — SODIUM CHLORIDE 0.9 % IJ SOLN: 3.0000 mL | INTRAMUSCULAR | Status: DC | PRN

## 2013-08-06 MED ORDER — ONDANSETRON HCL 4 MG/2ML IJ SOLN
4.0000 mg | Freq: Once | INTRAMUSCULAR | Status: AC
  Administered 2013-08-06: 4 mg via INTRAVENOUS
  Filled 2013-08-06: qty 2

## 2013-08-06 MED ORDER — DOCUSATE SODIUM 100 MG PO CAPS
100.0000 mg | ORAL_CAPSULE | Freq: Two times a day (BID) | ORAL | Status: DC
  Administered 2013-08-07 – 2013-08-08 (×3): 100 mg via ORAL
  Filled 2013-08-06 (×4): qty 1

## 2013-08-06 MED ORDER — MIDODRINE HCL 5 MG PO TABS
5.0000 mg | ORAL_TABLET | ORAL | Status: DC
  Administered 2013-08-08: 5 mg via ORAL
  Filled 2013-08-06: qty 1

## 2013-08-06 MED ORDER — OMEGA-3-ACID ETHYL ESTERS 1 G PO CAPS
1.0000 g | ORAL_CAPSULE | Freq: Two times a day (BID) | ORAL | Status: DC
  Administered 2013-08-07 – 2013-08-08 (×4): 1 g via ORAL
  Filled 2013-08-06 (×5): qty 1

## 2013-08-06 MED ORDER — ALLOPURINOL 100 MG PO TABS
100.0000 mg | ORAL_TABLET | Freq: Two times a day (BID) | ORAL | Status: DC
  Administered 2013-08-07 – 2013-08-08 (×4): 100 mg via ORAL
  Filled 2013-08-06 (×5): qty 1

## 2013-08-06 MED ORDER — AMIODARONE HCL 200 MG PO TABS
200.0000 mg | ORAL_TABLET | Freq: Every day | ORAL | Status: DC
  Administered 2013-08-07 – 2013-08-08 (×2): 200 mg via ORAL
  Filled 2013-08-06 (×2): qty 1

## 2013-08-06 MED ORDER — ONDANSETRON HCL 4 MG/2ML IJ SOLN: 4.0000 mg | Freq: Four times a day (QID) | INTRAMUSCULAR | Status: DC | PRN

## 2013-08-06 MED ORDER — SEVELAMER CARBONATE 800 MG PO TABS
800.0000 mg | ORAL_TABLET | Freq: Three times a day (TID) | ORAL | Status: DC
  Administered 2013-08-07 (×2): 800 mg via ORAL
  Filled 2013-08-06 (×7): qty 1

## 2013-08-06 MED ORDER — ALUM & MAG HYDROXIDE-SIMETH 200-200-20 MG/5ML PO SUSP: 30.0000 mL | Freq: Four times a day (QID) | ORAL | Status: DC | PRN

## 2013-08-06 MED ORDER — GABAPENTIN 300 MG PO CAPS
300.0000 mg | ORAL_CAPSULE | Freq: Two times a day (BID) | ORAL | Status: DC
  Administered 2013-08-07 – 2013-08-08 (×4): 300 mg via ORAL
  Filled 2013-08-06 (×5): qty 1

## 2013-08-06 MED ORDER — INSULIN ASPART 100 UNIT/ML ~~LOC~~ SOLN: 0.0000 [IU] | Freq: Every day | SUBCUTANEOUS | Status: DC

## 2013-08-06 MED ORDER — MORPHINE SULFATE 4 MG/ML IJ SOLN
4.0000 mg | Freq: Once | INTRAMUSCULAR | Status: AC
  Administered 2013-08-06: 4 mg via INTRAVENOUS
  Filled 2013-08-06: qty 1

## 2013-08-06 MED ORDER — ACETAMINOPHEN 325 MG PO TABS
650.0000 mg | ORAL_TABLET | Freq: Four times a day (QID) | ORAL | Status: DC | PRN
  Administered 2013-08-07 – 2013-08-08 (×2): 650 mg via ORAL
  Filled 2013-08-06: qty 2

## 2013-08-06 MED ORDER — INSULIN GLARGINE 100 UNIT/ML ~~LOC~~ SOLN
15.0000 [IU] | Freq: Every day | SUBCUTANEOUS | Status: DC
  Administered 2013-08-07 (×2): 15 [IU] via SUBCUTANEOUS
  Filled 2013-08-06 (×3): qty 0.15

## 2013-08-06 MED ORDER — OXYCODONE HCL 5 MG PO TABS
5.0000 mg | ORAL_TABLET | ORAL | Status: DC | PRN
  Administered 2013-08-06 – 2013-08-08 (×7): 5 mg via ORAL
  Filled 2013-08-06 (×7): qty 1

## 2013-08-06 MED ORDER — ENOXAPARIN SODIUM 30 MG/0.3ML ~~LOC~~ SOLN
30.0000 mg | SUBCUTANEOUS | Status: DC
  Administered 2013-08-07 (×2): 30 mg via SUBCUTANEOUS
  Filled 2013-08-06 (×3): qty 0.3

## 2013-08-06 MED ORDER — ADULT MULTIVITAMIN W/MINERALS CH
1.0000 | ORAL_TABLET | Freq: Every day | ORAL | Status: DC
  Administered 2013-08-07: 1 via ORAL
  Filled 2013-08-06 (×2): qty 1

## 2013-08-06 MED ORDER — ACETAMINOPHEN 650 MG RE SUPP: 650.0000 mg | Freq: Four times a day (QID) | RECTAL | Status: DC | PRN

## 2013-08-06 NOTE — Progress Notes (Signed)
Called by Ed to admit [name] due to hypoxia, elevated WBC's and back pain. Patient experienced fall and had orthopedic surgery about eight days ago, was on percocet and ultram at home. Hx of COPD. CXR w/o PNA, no fever. Accepted in observation, med-surg bed and TRH team 10.  Rodnesha Elie [id number]

## 2013-08-06 NOTE — ED Notes (Signed)
Arrived to E40 at 1320.

## 2013-08-06 NOTE — ED Notes (Signed)
Blood pressure being taken in left leg due to right arm in a hard cast and left arm restricted use due to dialysis fistula. Pickering, NP stated not to do the orthostatic vital signs.

## 2013-08-06 NOTE — ED Notes (Signed)
Reports slipping and falling this am, landed on his back and now having lower back pain.

## 2013-08-06 NOTE — ED Provider Notes (Signed)
CSN: [id number]     Arrival date & time 08/06/13  1234 History   First MD Initiated Contact with Patient 08/06/13 1304     Chief Complaint  Patient presents with  . Fall  . Back Pain   (Consider location/radiation/quality/duration/timing/severity/associated sxs/prior Treatment) HPI Comments: Pt states that he was outside looking at the snow and go dizzy and fell backwards:no loc with fall:pt states that he is not dizzy any more he is just having lower back pain:pt was able to ambulate with assistance:pt denies vomiting, fever, cough,cp, sob:pts states that he recently had surgery on his right arm for a olecranon bursitis:pt denies any problems to the area  The history is provided by the patient. No language interpreter was used.    Past Medical History  Diagnosis Date  . Coronary artery disease     a. hx of MI 1992; b. 10/2012 NSTEMI (no cath 2/2 GIB);  c. 11/2012 MV: Inf defect w inf and septal HK (favor prior MI), EF 47%;  d. 12/2012 Cath: Severe 3vd, EF 45-50%;  e. 01/2013 CABG x 5 (LIMA->LAD, VG->D1, VG->OM1->OM2, VG->PDA)  . Diabetes mellitus   . Hypertension   . Arthritis   . Carotid artery disease     a. 01/2013 doppler: >80% RICA stenosis, <39% LICA stenosis;  b. 01/2013 R CEA.  . Gastritis 2012 and 09/2011    treated for H Pylori in 12/2011  . Diabetic neuropathy   . Osteoporosis   . COPD (chronic obstructive pulmonary disease)   . Anxiety   . Depression   . ESRD (end stage renal disease)     M-W-Sa  [name]  . Diabetic nephropathy   . GERD (gastroesophageal reflux disease)   . Neuromuscular disorder     peripheral neurotherapy  . Anemia   . A-fib     a. Dx 10/2012, on amio, no anticoagulation 2/2 GIB 10/2012.  [name] GIB (gastrointestinal bleeding)     a. 10/2012, awaiting colonoscopy.   Past Surgical History  Procedure Laterality Date  . Coronary stent placement    . Hip surgery      both hips  . Av fistula placement      Left arm  . Cardiac catheterization    .  Joint replacement Bilateral   . Eye surgery Bilateral     cararacts  . Coronary artery bypass graft N/A 02/03/2013    Procedure: CORONARY ARTERY BYPASS GRAFTING (CABG);  Surgeon: Loreli Slot, MD;  Location[facility] OR;  Service: Open Heart Surgery;  Laterality: N/A;  . Endarterectomy N/A 02/03/2013    Procedure: ENDARTERECTOMY CAROTID;  Surgeon: Pryor Ochoa, MD;  Location: [facility] OR;  Service: Vascular;  Laterality: N/A;  . Olecranon bursectomy Right 07/28/2013    Procedure: RIGHT ELBOW OLECRANON BURSECTOMY ADVANCEMENT CLOSURE ;  Surgeon: Sharma Covert, MD;  Location: [facility] OR;  Service: Orthopedics;  Laterality: Right;   Family History  Problem Relation Age of Onset  . Heart attack Father 48  . Heart disease Father   . Stroke Mother   . Hypertension Mother   . Other Mother     varicose veins  . Stroke Maternal Grandmother   . Hypertension Brother    History  Substance Use Topics  . Smoking status: Former Smoker -- 1.00 packs/day for 25 years    Types: Cigarettes    Start date: 08/18/1965    Quit date: 08/18/1990  . Smokeless tobacco: Never Used  . Alcohol Use: Yes     Comment:  occasionally    Review of Systems  Constitutional: Negative.   Respiratory: Negative.   Cardiovascular: Negative.     Allergies  Statins  Home Medications   Current Outpatient Rx  Name  Route  Sig  Dispense  Refill  . acetaminophen (TYLENOL) 500 MG tablet   Oral   Take 500 mg by mouth every 4 (four) hours as needed for pain.         [name] allopurinol (ZYLOPRIM) 100 MG tablet   Oral   Take 100 mg by mouth 2 (two) times daily.          [name] amiodarone (PACERONE) 200 MG tablet   Oral   Take 200 mg by mouth daily.         [name] aspirin 81 MG tablet   Oral   Take 81 mg by mouth daily.         [name] aspirin EC 325 MG EC tablet   Oral   Take 1 tablet (325 mg total) by mouth daily.   30 tablet   0   . darbepoetin (ARANESP) 100 MCG/0.5ML SOLN   Intravenous   Inject 0.5 mLs (100 mcg total)  into the vein every Friday with hemodialysis.   4.2 mL      . docusate sodium (COLACE) 100 MG capsule   Oral   Take 100 mg by mouth 2 (two) times daily.         [name] docusate sodium (COLACE) 100 MG capsule   Oral   Take 1 capsule (100 mg total) by mouth 2 (two) times daily.   10 capsule   0   . fish oil-omega-3 fatty acids 1000 MG capsule   Oral   Take 1 g by mouth 2 (two) times daily.          [name] gabapentin (NEURONTIN) 300 MG capsule   Oral   Take 1 capsule (300 mg total) by mouth 2 (two) times daily.         . insulin aspart (NOVOLOG) 100 UNIT/ML injection   Subcutaneous   Inject 0-8 Units into the skin 3 (three) times daily before meals. 0-99= no insulin; 100-150= 2 units; 151-200=4 units; 201-250=6 units; greater than 250= 8 units.         . insulin glargine (LANTUS) 100 UNIT/ML injection   Subcutaneous   Inject 15 Units into the skin at bedtime.         [name] ipratropium (ATROVENT) 0.06 % nasal spray   Nasal   Place 1 spray into the nose 3 (three) times daily.         [name] lidocaine-prilocaine (EMLA) cream   Topical   Apply 1 application topically every Monday, Wednesday, and Friday.          . midodrine (PROAMATINE) 5 MG tablet   Oral   Take 5 mg by mouth 3 (three) times daily with meals.         . Multiple Vitamins-Minerals (MULTIVITAMIN WITH MINERALS) tablet   Oral   Take 1 tablet by mouth at bedtime.          . multivitamin (RENA-VIT) TABS tablet   Oral   Take 1 tablet by mouth at bedtime.          [name] oxyCODONE (ROXICODONE) 5 MG immediate release tablet   Oral   Take 1 tablet (5 mg total) by mouth every 4 (four) hours as needed for severe pain.   50 tablet   0   . pantoprazole (PROTONIX)  40 MG tablet   Oral   Take 40 mg by mouth daily.         [name] senna-docusate (SENOKOT-S) 8.6-50 MG per tablet   Oral   Take 1 tablet by mouth daily as needed for constipation.         . sevelamer carbonate (RENVELA) 800 MG tablet   Oral   Take 800 mg  by mouth 3 (three) times daily with meals.         . sodium chloride 0.9 % SOLN 100 mL with ferric gluconate 12.5 MG/ML SOLN 62.5 mg   Intravenous   Inject 62.5 mg into the vein every Wednesday with hemodialysis.         [name] tamsulosin (FLOMAX) 0.4 MG CAPS   Oral   Take 0.4 mg by mouth at bedtime.         . traMADol (ULTRAM) 50 MG tablet   Oral   Take 50 mg by mouth every 8 (eight) hours as needed for pain.         [name] zolpidem (AMBIEN) 5 MG tablet   Oral   Take 1 tablet (5 mg total) by mouth at bedtime as needed for sleep.   30 tablet   0    BP 106/51  Pulse 83  Temp(Src) 98 F (36.7 C) (Oral)  Resp 20  SpO2 94% Physical Exam  Nursing note and vitals reviewed. Constitutional: He is oriented to person, place, and time. He appears well-developed and well-nourished.  HENT:  Head: Normocephalic and atraumatic.  Cardiovascular: Normal rate and regular rhythm.   Pulmonary/Chest: Effort normal and breath sounds normal.  Abdominal: Soft. Bowel sounds are normal. There is no tenderness.  Musculoskeletal:       Cervical back: Normal.       Thoracic back: Normal.       Lumbar back: He exhibits bony tenderness.  Sacrum and coccyx tenderness:moves all extremities:pt is casted on the right TFT:DDUKGU has good perfusion, warm to touch  Neurological: He is alert and oriented to person, place, and time.  Skin: Skin is warm and dry.  Psychiatric: He has a normal mood and affect.    ED Course  Procedures (including critical care time) Labs Review Labs Reviewed  CBC - Abnormal; Notable for the following:    WBC 16.9 (*)    RBC 3.44 (*)    Hemoglobin 11.2 (*)    HCT 36.0 (*)    MCV 104.7 (*)    All other components within normal limits  POCT I-STAT, CHEM 8 - Abnormal; Notable for the following:    BUN 26 (*)    Creatinine, Ser 4.60 (*)    Glucose, Bld 148 (*)    Calcium, Ion 1.11 (*)    All other components within normal limits   Imaging Review Dg Cervical Spine  Complete  08/06/2013   CLINICAL DATA:  Neck pain  EXAM: CERVICAL SPINE  4+ VIEWS  COMPARISON:  None.  FINDINGS: Cervical spine is visualized to C5-6 on the lateral view. Lateral swimmer's view is over penetrated.  No evidence of fracture dislocation. Vertebral body heights are maintained. Dens is poorly visualized on the attempted odontoid view.  No prevertebral soft tissue swelling.  Visualized lungs are notable for a postsurgical changes in the left hemithorax with suspected volume loss.  IMPRESSION: Limited study. Dens is poorly visualized. Lower cervical spine is not well visualized on the lateral view.  No gross fracture is seen to C5-6.   Electronically Signed  By: Charline Bills M.D.   On: 08/06/2013 16:39   Dg Lumbar Spine Complete  08/06/2013   CLINICAL DATA:  Pain post fall  EXAM: LUMBAR SPINE - COMPLETE 4+ VIEW  COMPARISON:  None.  FINDINGS: Osteopenia. Diffuse ankylosis in the lumbosacral spine. No definite fracture. Heavy aortoiliac arterial calcifications. Bilateral hip arthroplasty hardware partially seen.  IMPRESSION: 1.  No definite fracture or other acute bone abnormality. 2. 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   On: 08/06/2013 16:23   Dg Sacrum/coccyx  08/06/2013   CLINICAL DATA:  Fall, low back pain  EXAM: SACRUM AND COCCYX - 2+ VIEW  COMPARISON:  None.  FINDINGS: Osteopenia.  Sacrum is poorly visualized on the frontal radiographs. Suspected ankylosis.  No displaced sacrococcygeal fracture is seen on the lateral view.  Bilateral hip arthroplasties.  IMPRESSION: Osteopenia.  No displaced sacrococcygeal fracture is seen.   Electronically Signed   By: Charline Bills M.D.   On: 08/06/2013 16:40   Ct Head Wo Contrast  08/06/2013   CLINICAL DATA:  Larey Seat, pain  EXAM: CT HEAD WITHOUT CONTRAST  TECHNIQUE: Contiguous axial images were obtained from the base of the skull through the vertex without intravenous  contrast.  COMPARISON:  None.  FINDINGS: Dental caries. Small retention cyst or polyp inferiorly in the right maxillary sinus. Fluid level in the right frontal sinus. Atherosclerotic and physiologic intracranial calcifications. Moderate parenchymal atrophy. There is no evidence of acute intracranial hemorrhage, brain edema, mass lesion, acute infarction, mass effect, or midline shift. Acute infarct may be inapparent on noncontrast CT. No other intra-axial abnormalities are seen, and the ventricles and sulci are within normal limits in size and symmetry. No abnormal extra-axial fluid collections or masses are identified. No significant calvarial abnormality.  IMPRESSION: 1. Negative for bleed or other acute intracranial process.   Electronically Signed   By: Oley Balm M.D.   On: 08/06/2013 14:59   Ct Angio Chest Pe W/cm &/or Wo Cm  08/06/2013   CLINICAL DATA:  History of fall complaining of back pain.  EXAM: CT ANGIOGRAPHY CHEST WITH CONTRAST  TECHNIQUE: Multidetector CT imaging of the chest was performed using the standard protocol during bolus administration of intravenous contrast. Multiplanar CT image reconstructions including MIPs were obtained to evaluate the vascular anatomy.  CONTRAST:  60mL OMNIPAQUE IOHEXOL 350 MG/ML SOLN  COMPARISON:  None.  FINDINGS: Mediastinum: No filling defects within the pulmonary arterial tree to suggest underlying pulmonary embolism. Heart size is normal. There is no significant pericardial fluid, thickening or pericardial calcification. There is atherosclerosis of the thoracic aorta, the great vessels of the mediastinum and the coronary arteries, including calcified atherosclerotic plaque in the left main, left anterior descending, left circumflex and right coronary arteries. Status post median sternotomy for CABG, including LIMA to the LAD. Aberrant right subclavian artery (normal anatomical variant) incidentally noted. No pathologically enlarged mediastinal or hilar  lymph nodes. Esophagus is unremarkable in appearance.  Lungs/Pleura: Elevation of the left hemidiaphragm with associated areas of passive atelectasis in the left lower lobe and inferior segment of the lingula. There is also some dependent subsegmental atelectasis in the right lower lobe as well. No definite consolidative airspace disease. No pleural effusions. No suspicious appearing pulmonary nodules or masses are identified.  Upper Abdomen: Multiple renal lesions bilaterally. The largest on the right measures 1.4 cm extending exophytically from the upper pole of the right kidney, measuring 64 HU.  The largest on the left measures 1.4 cm extending exophytically from the lateral aspect of the interpolar region of the left kidney and is low-attenuation (13 HU). Small calcifications associated with the left renal collecting system may represent nonobstructive calculi or vascular calcifications, ranging in size from 2-4 mm. Extensive atherosclerosis throughout the visualized abdominal vasculature.  Musculoskeletal: Status post median sternotomy. Multilevel degenerative disc disease, including chronic fusion of T5-T7. There are no aggressive appearing lytic or blastic lesions noted in the visualized portions of the skeleton.  Review of the MIP images confirms the above findings.  IMPRESSION: 1. No pulmonary embolism identified. 2. No acute findings in the thorax. 3. Elevation of the left hemidiaphragm with passive atelectasis in portions of the left lower lobe and inferior segment of the lingula. There is also some mild dependent subsegmental atelectasis in the posterior aspect of the right lower lobe as well. 4. Atherosclerosis, including left main and 3 vessel coronary artery disease. Status post median sternotomy for CABG. 5. Multiple renal lesions bilaterally, incompletely characterized, as detailed above. These are favored to reflect simple cysts and proteinaceous cysts, but these warrant further evaluation with  nonemergent renal ultrasound. 6. Additional incidental findings, as above.   Electronically Signed   By: Trudie Reed M.D.   On: 08/06/2013 18:02    EKG Interpretation    Date/Time:  Saturday August 06 2013 13:36:37 EST Ventricular Rate:  85 PR Interval:  175 QRS Duration: 99 QT Interval:  389 QTC Calculation: 463 R Axis:   36 Text Interpretation:  Sinus rhythm Probable left atrial enlargement No significant change since last tracing Confirmed by SHELDON  MD, CHARLES ([id number]) on 08/06/2013 1:44:19 PM            MDM   1. Fall, initial encounter   2. Hypoxia   3. Leukocytosis    Pt to be admitted as pt is hypoxic here without any sign of pe or pneumonia:with fall pt needs to admitted for observation    Teressa Lower, NP 08/06/13 1915

## 2013-08-06 NOTE — ED Provider Notes (Signed)
Medical screening examination/treatment/procedure(s) were performed by non-physician practitioner and as supervising physician I was immediately available for consultation/collaboration.  EKG Interpretation    Date/Time:  Saturday August 06 2013 13:36:37 EST Ventricular Rate:  85 PR Interval:  175 QRS Duration: 99 QT Interval:  389 QTC Calculation: 463 R Axis:   36 Text Interpretation:  Sinus rhythm Probable left atrial enlargement No significant change since last tracing Confirmed by Yesha Muchow  MD, Yuan Gann [phone]) on 08/06/2013 1:44:19 PM              Adon Gehlhausen B. Bernette Mayers, MD 08/06/13 [phone]

## 2013-08-06 NOTE — ED Notes (Signed)
Patient removed from O2 by request of PA Pickering. Pt SATS at time of removal 96-98% at 2 lpm via [place].

## 2013-08-06 NOTE — ED Notes (Signed)
Patient returned from Radiology. 

## 2013-08-06 NOTE — ED Notes (Signed)
Dr. Bernette Mayers spoke with Dr. Valaria Good from Nephrology and got permission to place an IV above the dialysis fistula in the left arm in the antecubital area so patient can have a CT Angio Chest.

## 2013-08-06 NOTE — ED Notes (Signed)
Patient transported to X-ray 

## 2013-08-06 NOTE — H&P (Signed)
[facility] History and Physical  JAMAREE HOSIER ZOX:[id number] DOB: [birth year] DOA: 08/06/2013  Referring physician:  EDP PCP: Daniel Nones  Specialists:   Chief Complaint:  Passed Out  HPI: ARTHURO CANELO is a 72 y.o. male who was sent to the ED after he reports that he became dizzy and passed out and landed on his back.   He reports having increased pain in his lower back afterward, and was found to have hypoxemia in the ED with O2 sats of 80% . He was placed on supplemental O2 and improved.  He was sent for a CTA of the Chest as well as imaging studies of his head  Neck and lumbosacral spine.  There were no Fractures found, and his CTA of the Chest was negative for a PE.  He was referred for medical admission.      Review of Systems: The patient denies anorexia, fever, chills, headaches, weight loss, vision loss, diplopia, dizziness, decreased hearing, rhinitis, hoarseness, chest pain, syncope, dyspnea on exertion, peripheral edema, balance deficits, cough, hemoptysis, abdominal pain, nausea, vomiting, diarrhea, constipation, hematemesis, melena, hematochezia, severe indigestion/heartburn, dysuria, hematuria, incontinence, muscle weakness, suspicious skin lesions, transient blindness, difficulty walking, depression, unusual weight change, abnormal bleeding, enlarged lymph nodes, angioedema, and breast masses.    Past Medical History  Diagnosis Date  . Coronary artery disease     a. hx of MI 1992; b. 10/2012 NSTEMI (no cath 2/2 GIB);  c. 11/2012 MV: Inf defect w inf and septal HK (favor prior MI), EF 47%;  d. 12/2012 Cath: Severe 3vd, EF 45-50%;  e. 01/2013 CABG x 5 (LIMA->LAD, VG->D1, VG->OM1->OM2, VG->PDA)  . Diabetes mellitus   . Hypertension   . Arthritis   . Carotid artery disease     a. 01/2013 doppler: >80% RICA stenosis, <39% LICA stenosis;  b. 01/2013 R CEA.  . Gastritis 2012 and 09/2011    treated for H Pylori in 12/2011  . Diabetic neuropathy   . Osteoporosis   . COPD  (chronic obstructive pulmonary disease)   . Anxiety   . Depression   . ESRD (end stage renal disease)     M-W-Sa  [name]  . Diabetic nephropathy   . GERD (gastroesophageal reflux disease)   . Neuromuscular disorder     peripheral neurotherapy  . Anemia   . A-fib     a. Dx 10/2012, on amio, no anticoagulation 2/2 GIB 10/2012.  [name] GIB (gastrointestinal bleeding)     a. 10/2012, awaiting colonoscopy.    Past Surgical History  Procedure Laterality Date  . Coronary stent placement    . Hip surgery      both hips  . Av fistula placement      Left arm  . Cardiac catheterization    . Joint replacement Bilateral   . Eye surgery Bilateral     cararacts  . Coronary artery bypass graft N/A 02/03/2013    Procedure: CORONARY ARTERY BYPASS GRAFTING (CABG);  Surgeon: Loreli Slot, MD;  Location: [facility] OR;  Service: Open Heart Surgery;  Laterality: N/A;  . Endarterectomy N/A 02/03/2013    Procedure: ENDARTERECTOMY CAROTID;  Surgeon: Pryor Ochoa, MD;  Location: [facility] OR;  Service: Vascular;  Laterality: N/A;  . Olecranon bursectomy Right 07/28/2013    Procedure: RIGHT ELBOW OLECRANON BURSECTOMY ADVANCEMENT CLOSURE ;  Surgeon: Sharma Covert, MD;  Location: [facility] OR;  Service: Orthopedics;  Laterality: Right;    Prior to Admission medications   Medication Sig Start Date End  Date Taking? Authorizing Provider  acetaminophen (TYLENOL) 500 MG tablet Take 500 mg by mouth every 4 (four) hours as needed (pain).    Yes Historical Provider, MD  allopurinol (ZYLOPRIM) 100 MG tablet Take 100 mg by mouth 2 (two) times daily.    Yes Historical Provider, MD  amiodarone (PACERONE) 200 MG tablet Take 200 mg by mouth daily.   Yes Historical Provider, MD  aspirin 81 MG tablet Take 324 mg by mouth daily.    Yes Historical Provider, MD  docusate sodium (COLACE) 100 MG capsule Take 100 mg by mouth 2 (two) times daily.   Yes Historical Provider, MD  fish oil-omega-3 fatty acids 1000 MG capsule Take 1 g by  mouth 2 (two) times daily.    Yes Historical Provider, MD  gabapentin (NEURONTIN) 300 MG capsule Take 1 capsule (300 mg total) by mouth 2 (two) times daily. 02/15/13  Yes Donielle Margaretann Loveless, PA-C  insulin aspart protamine- aspart (NOVOLOG MIX 70/30) (70-30) 100 UNIT/ML injection Inject 2 Units into the skin 3 (three) times daily with meals.   Yes Historical Provider, MD  insulin glargine (LANTUS) 100 UNIT/ML injection Inject 15 Units into the skin at bedtime. 10/27/12  Yes Vassie Loll, MD  ipratropium (ATROVENT) 0.06 % nasal spray Place 1 spray into the nose 3 (three) times daily.   Yes Historical Provider, MD  midodrine (PROAMATINE) 5 MG tablet Take 5 mg by mouth 3 (three) times a week. Take once daily in the morning on dialysis days (usually on Monday, Wednesday and Friday)   Yes Historical Provider, MD  Multiple Vitamins-Minerals (MULTIVITAMIN WITH MINERALS) tablet Take 1 tablet by mouth daily.    Yes Historical Provider, MD  multivitamin (RENA-VIT) TABS tablet Take 1 tablet by mouth at bedtime.    Yes Historical Provider, MD  oxyCODONE (OXY IR/ROXICODONE) 5 MG immediate release tablet Take 5 mg by mouth every 4 (four) hours as needed for severe pain.   Yes Historical Provider, MD  pantoprazole (PROTONIX) 40 MG tablet Take 40 mg by mouth daily.   Yes Historical Provider, MD  sevelamer carbonate (RENVELA) 800 MG tablet Take 800 mg by mouth 3 (three) times daily with meals.   Yes Historical Provider, MD  traMADol-acetaminophen (ULTRACET) 37.5-325 MG per tablet Take 1 tablet by mouth 3 (three) times daily as needed (pain).  08/01/13  Yes Historical Provider, MD  zolpidem (AMBIEN) 10 MG tablet Take 10 mg by mouth at bedtime.   Yes Historical Provider, MD    Allergies  Allergen Reactions  . Statins Other (See Comments)    Muscle weakness     Social History:  reports that he quit smoking about 22 years ago. His smoking use included Cigarettes. He started smoking about 48 years ago. He has a 25  pack-year smoking history. He has never used smokeless tobacco. He reports that he drinks alcohol. He reports that he does not use illicit drugs.     Family History  Problem Relation Age of Onset  . Heart attack Father 5  . Heart disease Father   . Stroke Mother   . Hypertension Mother   . Other Mother     varicose veins  . Stroke Maternal Grandmother   . Hypertension Brother       Physical Exam:  GEN:  Pleasant  Obese Elderly   72 y.o. Caucasian male  examined  and in no acute distress; cooperative with exam Filed Vitals:   08/06/13 1830 08/06/13 1903 08/06/13 1930 08/06/13 2000  BP:  128/78 122/65 129/52 98/57  Pulse: 92 97 95 95  Temp:      TempSrc:      Resp: 16 22 12 14   SpO2: 98% 97% 90% 94%   Blood pressure 98/57, pulse 95, temperature 98 F (36.7 C), temperature source Oral, resp. rate 14, SpO2 94.00%. PSYCH: He is alert and oriented x4; does not appear anxious does not appear depressed; affect is normal HEENT: Normocephalic and Atraumatic, Mucous membranes pink; PERRLA; EOM intact; Fundi:  Benign;  No scleral icterus, Nares: Patent, Oropharynx: Clear, Sparse Poor Dentition, Neck:  FROM, no cervical lymphadenopathy nor thyromegaly or carotid bruit; no JVD; Breasts:: Not examined CHEST WALL: No tenderness CHEST: Normal respiration, clear to auscultation bilaterally HEART: Regular rate and rhythm; no murmurs rubs or gallops BACK: No kyphosis or scoliosis; no CVA tenderness ABDOMEN: Positive Bowel Sounds, Obese, soft non-tender; no masses, no organomegaly, no pannus; no intertriginous candida. Rectal Exam: Not done EXTREMITIES: No cyanosis, clubbing or edema; no ulcerations. Genitalia: not examined PULSES: 2+ and symmetric SKIN: Normal hydration no rash or ulceration CNS: Cranial nerves 2-12 grossly intact no focal neurologic deficit    Labs on Admission:  Basic Metabolic Panel:  Recent Labs Lab 08/06/13 1358  NA 136  K 4.4  CL 96  GLUCOSE 148*  BUN 26*   CREATININE 4.60*   Liver Function Tests: No results found for this basename: AST, ALT, ALKPHOS, BILITOT, PROT, ALBUMIN,  in the last 168 hours No results found for this basename: LIPASE, AMYLASE,  in the last 168 hours No results found for this basename: AMMONIA,  in the last 168 hours CBC:  Recent Labs Lab 08/06/13 1340 08/06/13 1358  WBC 16.9*  --   HGB 11.2* 13.3  HCT 36.0* 39.0  MCV 104.7*  --   PLT 295  --    Cardiac Enzymes: No results found for this basename: CKTOTAL, CKMB, CKMBINDEX, TROPONINI,  in the last 168 hours  BNP (last 3 results)  Recent Labs  10/20/12 0320  PROBNP 38564.0*   CBG: No results found for this basename: GLUCAP,  in the last 168 hours  Radiological Exams on Admission: Dg Cervical Spine Complete  08/06/2013   CLINICAL DATA:  Neck pain  EXAM: CERVICAL SPINE  4+ VIEWS  COMPARISON:  None.  FINDINGS: Cervical spine is visualized to C5-6 on the lateral view. Lateral swimmer's view is over penetrated.  No evidence of fracture dislocation. Vertebral body heights are maintained. Dens is poorly visualized on the attempted odontoid view.  No prevertebral soft tissue swelling.  Visualized lungs are notable for a postsurgical changes in the left hemithorax with suspected volume loss.  IMPRESSION: Limited study. Dens is poorly visualized. Lower cervical spine is not well visualized on the lateral view.  No gross fracture is seen to C5-6.   Electronically Signed   By: Charline Bills M.D.   On: 08/06/2013 16:39   Dg Lumbar Spine Complete  08/06/2013   CLINICAL DATA:  Pain post fall  EXAM: LUMBAR SPINE - COMPLETE 4+ VIEW  COMPARISON:  None.  FINDINGS: Osteopenia. Diffuse ankylosis in the lumbosacral spine. No definite fracture. Heavy aortoiliac arterial calcifications. Bilateral hip arthroplasty hardware partially seen.  IMPRESSION: 1.  No definite fracture or other acute bone abnormality. 2. Osteopenia with extensive ankylosis in the   lumbosacral spine    Electronically Signed   By: Oley Balm M.D.   On: 08/06/2013 16:23   Dg Sacrum/coccyx  08/06/2013   CLINICAL DATA:  Fall, low back  pain  EXAM: SACRUM AND COCCYX - 2+ VIEW  COMPARISON:  None.  FINDINGS: Osteopenia.  Sacrum is poorly visualized on the frontal radiographs. Suspected ankylosis.  No displaced sacrococcygeal fracture is seen on the lateral view.  Bilateral hip arthroplasties.  IMPRESSION: Osteopenia.  No displaced sacrococcygeal fracture is seen.   Electronically Signed   By: Charline Bills M.D.   On: 08/06/2013 16:40   Ct Head Wo Contrast  08/06/2013   CLINICAL DATA:  Larey Seat, pain  EXAM: CT HEAD WITHOUT CONTRAST  TECHNIQUE: Contiguous axial images were obtained from the base of the skull through the vertex without intravenous contrast.  COMPARISON:  None.  FINDINGS: Dental caries. Small retention cyst or polyp inferiorly in the right maxillary sinus. Fluid level in the right frontal sinus. Atherosclerotic and physiologic intracranial calcifications. Moderate parenchymal atrophy. There is no evidence of acute intracranial hemorrhage, brain edema, mass lesion, acute infarction, mass effect, or midline shift. Acute infarct may be inapparent on noncontrast CT. No other intra-axial abnormalities are seen, and the ventricles and sulci are within normal limits in size and symmetry. No abnormal extra-axial fluid collections or masses are identified. No significant calvarial abnormality.  IMPRESSION: 1. Negative for bleed or other acute intracranial process.   Electronically Signed   By: Oley Balm M.D.   On: 08/06/2013 14:59   Ct Angio Chest Pe W/cm &/or Wo Cm  08/06/2013   CLINICAL DATA:  History of fall complaining of back pain.  EXAM: CT ANGIOGRAPHY CHEST WITH CONTRAST  TECHNIQUE: Multidetector CT imaging of the chest was performed using the standard protocol during bolus administration of intravenous contrast. Multiplanar CT image reconstructions including MIPs were obtained to  evaluate the vascular anatomy.  CONTRAST:  60mL OMNIPAQUE IOHEXOL 350 MG/ML SOLN  COMPARISON:  None.  FINDINGS: Mediastinum: No filling defects within the pulmonary arterial tree to suggest underlying pulmonary embolism. Heart size is normal. There is no significant pericardial fluid, thickening or pericardial calcification. There is atherosclerosis of the thoracic aorta, the great vessels of the mediastinum and the coronary arteries, including calcified atherosclerotic plaque in the left main, left anterior descending, left circumflex and right coronary arteries. Status post median sternotomy for CABG, including LIMA to the LAD. Aberrant right subclavian artery (normal anatomical variant) incidentally noted. No pathologically enlarged mediastinal or hilar lymph nodes. Esophagus is unremarkable in appearance.  Lungs/Pleura: Elevation of the left hemidiaphragm with associated areas of passive atelectasis in the left lower lobe and inferior segment of the lingula. There is also some dependent subsegmental atelectasis in the right lower lobe as well. No definite consolidative airspace disease. No pleural effusions. No suspicious appearing pulmonary nodules or masses are identified.  Upper Abdomen: Multiple renal lesions bilaterally. The largest on the right measures 1.4 cm extending exophytically from the upper pole of the right kidney, measuring 64 HU. The largest on the left measures 1.4 cm extending exophytically from the lateral aspect of the interpolar region of the left kidney and is low-attenuation (13 HU). Small calcifications associated with the left renal collecting system may represent nonobstructive calculi or vascular calcifications, ranging in size from 2-4 mm. Extensive atherosclerosis throughout the visualized abdominal vasculature.  Musculoskeletal: Status post median sternotomy. Multilevel degenerative disc disease, including chronic fusion of T5-T7. There are no aggressive appearing lytic or blastic  lesions noted in the visualized portions of the skeleton.  Review of the MIP images confirms the above findings.  IMPRESSION: 1. No pulmonary embolism identified. 2. No acute findings in the thorax. 3.  Elevation of the left hemidiaphragm with passive atelectasis in portions of the left lower lobe and inferior segment of the lingula. There is also some mild dependent subsegmental atelectasis in the posterior aspect of the right lower lobe as well. 4. Atherosclerosis, including left main and 3 vessel coronary artery disease. Status post median sternotomy for CABG. 5. Multiple renal lesions bilaterally, incompletely characterized, as detailed above. These are favored to reflect simple cysts and proteinaceous cysts, but these warrant further evaluation with nonemergent renal ultrasound. 6. Additional incidental findings, as above.   Electronically Signed   By: Trudie Reed M.D.   On: 08/06/2013 18:02     EKG: Independently reviewed.  Normal Sinus Rhythm No Acute S-T changes   Assessment/Plan Principal Problem:   Syncope and collapse Active Problems:   Hypoxia   Fall   Intractable low back pain   ESRD (end stage renal disease) on dialysis   DM (diabetes mellitus)   CAD (coronary artery disease)   Bursitis of elbow   Systolic CHF   Leukocytosis    1.  Syncope - Admitted  For syncope workup, Neuro checks and orthostatic VS checks,  Monitor pulse oximetry, and Telemetry for Cardiac changes.     2.  Hypoxia-  CTA Chest Negative for PE, Monitor on Pulse Oximetry, O2 PRN.    3.  Intractable Low Back pain S/P Fall-  X-rays of Lumbar/Sacral spine Negative for Fracture, PRN IV Pain Control.    4.  ESRD on HD-  Has HD MWFs,  Notify Dialysis/Renal Team of admission.    5.  DM2-  Continue Lantus and Novolog TID with meals, and  SSI coverage PRN.  Check HbA1c in AM.    5.  CAD- stable.     6.  Systolic CHF- Monitor for Signs and Sxs of Fluid Overload.   Currently with Saline Lock IV.    7.   Leukocytosis- Possible Stress  Reaction currently afebrile, monitor trend.    8.   Bursitis Right Elbow-  Had Surgery and currently in [facility] until 12/30 .   Surgery done by Dr. Orlan Leavens.         Code Status:   FULL CODE Family Communication:   No Family Present  Disposition Plan:    Observation  Time spent:  39 Minutes  Ron Parker [facility] Pager [phone]  If 7PM-7AM, please contact night-coverage [email or web address] Password [facility] 08/06/2013, 8:37 PM

## 2013-08-06 NOTE — ED Notes (Signed)
Patient sats on room air 84-88%, notified Pickering, NP and placed back on [place] 2L

## 2013-08-06 NOTE — ED Notes (Signed)
Patient transported to CT 

## 2013-08-06 NOTE — ED Notes (Signed)
Internal Medicine MD just walked into room. Will transfer to floor when she is finished with assessment.

## 2013-08-06 NOTE — ED Notes (Signed)
Garnette Scheuermann, sister-in-law, [phone] if needed.

## 2013-08-07 DIAGNOSIS — I779 Disorder of arteries and arterioles, unspecified: Secondary | ICD-10-CM

## 2013-08-07 DIAGNOSIS — M549 Dorsalgia, unspecified: Secondary | ICD-10-CM

## 2013-08-07 DIAGNOSIS — R0902 Hypoxemia: Secondary | ICD-10-CM

## 2013-08-07 DIAGNOSIS — R7309 Other abnormal glucose: Secondary | ICD-10-CM

## 2013-08-07 DIAGNOSIS — J9600 Acute respiratory failure, unspecified whether with hypoxia or hypercapnia: Secondary | ICD-10-CM

## 2013-08-07 LAB — CBC
Hemoglobin: 10.2 g/dL — ABNORMAL LOW (ref 13.0–17.0)
MCH: 31.6 pg (ref 26.0–34.0)
MCHC: 30.5 g/dL (ref 30.0–36.0)
MCV: 103.4 fL — ABNORMAL HIGH (ref 78.0–100.0)
RBC: 3.23 MIL/uL — ABNORMAL LOW (ref 4.22–5.81)
RDW: 15.1 % (ref 11.5–15.5)
WBC: 12 10*3/uL — ABNORMAL HIGH (ref 4.0–10.5)

## 2013-08-07 LAB — BASIC METABOLIC PANEL
BUN: 36 mg/dL — ABNORMAL HIGH (ref 6–23)
CO2: 23 mEq/L (ref 19–32)
Calcium: 8.5 mg/dL (ref 8.4–10.5)
Creatinine, Ser: 5.81 mg/dL — ABNORMAL HIGH (ref 0.50–1.35)
GFR calc non Af Amer: 9 mL/min — ABNORMAL LOW (ref >90)
Glucose, Bld: 103 mg/dL — ABNORMAL HIGH (ref 70–99)
Potassium: 5.2 mEq/L — ABNORMAL HIGH (ref 3.5–5.1)
Sodium: 133 mEq/L — ABNORMAL LOW (ref 135–145)

## 2013-08-07 LAB — HEMOGLOBIN A1C
Hgb A1c MFr Bld: 5.5 % (ref <5.7)
Mean Plasma Glucose: 111 mg/dL (ref <117)

## 2013-08-07 LAB — GLUCOSE, CAPILLARY: Glucose-Capillary: 120 mg/dL — ABNORMAL HIGH (ref 70–99)

## 2013-08-07 MED ORDER — DOXERCALCIFEROL 4 MCG/2ML IV SOLN
1.0000 ug | INTRAVENOUS | Status: DC
  Administered 2013-08-08: 1 ug via INTRAVENOUS
  Filled 2013-08-07: qty 2

## 2013-08-07 MED ORDER — HYDRALAZINE HCL 20 MG/ML IJ SOLN: 10.0000 mg | Freq: Four times a day (QID) | INTRAMUSCULAR | Status: DC | PRN

## 2013-08-07 MED ORDER — DARBEPOETIN ALFA-POLYSORBATE 60 MCG/0.3ML IJ SOLN
60.0000 ug | INTRAMUSCULAR | Status: DC
  Administered 2013-08-08: 60 ug via INTRAVENOUS
  Filled 2013-08-07: qty 0.3

## 2013-08-07 NOTE — Progress Notes (Addendum)
[facility]                                                                                Patient Demographics  Nicholas [name], is a 72 y.o. male, DOB - [birth year], ZOX:[id number]  Admit date - 08/06/2013   Admitting Physician No admitting provider for patient encounter.  Outpatient Primary MD for the patient is Daniel Nones  LOS - 1   Chief Complaint  Patient presents with  . Fall  . Back Pain        Assessment & Plan    Principal Problem:   Syncope and collapse Active Problems:   ESRD (end stage renal disease) on dialysis   DM (diabetes mellitus)   CAD (coronary artery disease)   Bursitis of elbow   Hypoxia   Fall   Intractable low back pain   Systolic CHF   Leukocytosis  Pre-syncopal episode  -Patient denies incontinence, he states he simply fell backwards after he was standing at his window -May be vasovagal -Pending orthostatic vitals -Will continue neuro checks, telemetry to monitor for any tachycardia or bradycardia arrhythmias -CT head negative for bleed or acute intracranial process.  Hypoxia likely secondary to COPD -Patient states he is supposed to use oxygen at home however denies using any the last few months -May be at baseline -Continue supplemental oxygen -CT of the chest negative for PE -Continue nebulizer treatments  Intractable lower back pain status post fall -X-ray of the lumbar and sacral spine negative for fracture -Continue pain control as needed -Will order physical therapy  Diabetes mellitus type 2 with neuropathy -Continue Lantus and NovoLog sliding scale with CBG monitoring -Hemoglobin A1c pending -Continue gabapentin  End-stage renal disease -Nephrology consulted, patient dialyzes Monday Wednesday Friday -Continue renavit, renvela  Uncontrolled HTN -Will hold midodrine for now -likely secondary to pain  Coronary artery disease  -Currently stable, patient chest pain-free -s/p CABG in July 2014  Systolic  CHF -Currently stable, patient does not appear to be fluid overloaded at this time -will continue to monitor daily weights, strict in and output, fluid restriction  Leukocytosis -Likely an acute phase reactant, patient is afebrile, chest x-ray negative for infiltrate -Will obtain blood cultures and a urinalysis -Leukocytosis trending downward  Bursitis of the right elbow -Currently in cast until 08/16/2013 -Surgery conducted by Dr. Orlan Leavens  Afib -Currently rate and rhythm control -Continue amiodarone  Code Status: Full  Family Communication: Brother via phone  Disposition Plan: Admitted.   Procedures None  Consults   Nephrology  DVT Prophylaxis  Lovenox  Lab Results  Component Value Date   PLT 278 08/07/2013    Medications  Scheduled Meds: . allopurinol  100 mg Oral BID  . amiodarone  200 mg Oral Daily  . aspirin  324 mg Oral Daily  . docusate sodium  100 mg Oral BID  . enoxaparin (LOVENOX) injection  30 mg Subcutaneous Q24H  . gabapentin  300 mg Oral BID  . insulin aspart  0-5 Units Subcutaneous QHS  . insulin aspart  0-9 Units Subcutaneous TID WC  . insulin aspart protamine- aspart  2 Units Subcutaneous TID WC  . insulin glargine  15 Units Subcutaneous QHS  .  ipratropium  1 spray Nasal TID  . [START ON 08/08/2013] midodrine  5 mg Oral 3 times weekly  . multivitamin  1 tablet Oral QHS  . multivitamin with minerals  1 tablet Oral Daily  . omega-3 acid ethyl esters  1 g Oral BID  . pantoprazole  40 mg Oral Daily  . sevelamer carbonate  800 mg Oral TID WC   Continuous Infusions:  PRN Meds:.acetaminophen, acetaminophen, HYDROmorphone (DILAUDID) injection, ondansetron (ZOFRAN) IV, ondansetron, oxyCODONE, zolpidem  Antibiotics    Anti-infectives   None       Time Spent in minutes   30 minutes   Gavynn Duvall D.O. on 08/07/2013 at 12:09 PM  Between 7am to 7pm - Pager - [phone]  After 7pm go to [email or web address] - password [email or web address]  And look for  the night coverage person covering for me after hours  [facility] Office  [phone]    Subjective:   Nicholas [name] seen and examined today.  Patient continues to complain of back pain. Denies losing consciousness with a syncopal episode, denies any current dizziness chest pain or shortness of breath. Patient does state he states he is oxygen at home however does not.  Objective:   Filed Vitals:   08/07/13 0843 08/07/13 0848 08/07/13 0852 08/07/13 0855  BP: 92/45 91/46 167/143 182/107  Pulse: 80 78 91   Temp: 98.5 F (36.9 C)     TempSrc: Oral     Resp: 17     Height:      Weight:      SpO2: 95% 94% 93%     Wt Readings from Last 3 Encounters:  08/06/13 75.9 kg (167 lb 5.3 oz)  07/21/13 75.382 kg (166 lb 3 oz)  03/29/13 74.39 kg (164 lb)     Intake/Output Summary (Last 24 hours) at 08/07/13 1209 Last data filed at 08/07/13 0602  Gross per 24 hour  Intake    240 ml  Output      0 ml  Net    240 ml    Exam  General: Well developed, well nourished, NAD, appears stated age  HEENT: NCAT, PERRLA, EOMI, Anicteic Sclera, mucous membranes moist. No pharyngeal erythema or exudates  Neck: Supple, no JVD, no masses  Cardiovascular: S1 S2 auscultated, 1/6 SEM. Regular rate and rhythm.  Respiratory: Clear to auscultation bilaterally with equal chest rise  Abdomen: Soft, nontender, nondistended, + bowel sounds  Extremities: warm dry without cyanosis clubbing or edema  Neuro: AAOx3, cranial nerves grossly intact. Strength 5/5 in patient's upper and lower extremities bilaterally  Skin: Without rashes exudates or nodules  Psych: Normal affect and demeanor with intact judgement and insight   Data Review   Micro Results No results found for this or any previous visit (from the past 240 hour(s)).  Radiology Reports Dg Chest 2 View  07/21/2013   CLINICAL DATA:  History of CAD, diabetes, hypertension and COPD, AFib. Right elbow olecranon bursectomy  advancement closure.  EXAM: CHEST  2 VIEW  COMPARISON:  03/29/2013  FINDINGS: The patient has had median sternotomy and CABG. The heart is mildly enlarged. There is elevation of left hemidiaphragm. Left lung base atelectasis or scar is noted. There are no focal consolidations or pleural effusions. No pulmonary edema. Note is made of chronic changes in both shoulders.  IMPRESSION: 1. Cardiomegaly without pulmonary edema. 2. Stable changes at the left lung base.   Electronically Signed   By: Rosalie Gums M.D.   On: 07/21/2013 14:28  Dg Cervical Spine Complete  08/06/2013   CLINICAL DATA:  Neck pain  EXAM: CERVICAL SPINE  4+ VIEWS  COMPARISON:  None.  FINDINGS: Cervical spine is visualized to C5-6 on the lateral view. Lateral swimmer's view is over penetrated.  No evidence of fracture dislocation. Vertebral body heights are maintained. Dens is poorly visualized on the attempted odontoid view.  No prevertebral soft tissue swelling.  Visualized lungs are notable for a postsurgical changes in the left hemithorax with suspected volume loss.  IMPRESSION: Limited study. Dens is poorly visualized. Lower cervical spine is not well visualized on the lateral view.  No gross fracture is seen to C5-6.   Electronically Signed   By: Charline Bills M.D.   On: 08/06/2013 16:39   Dg Lumbar Spine Complete  08/06/2013   CLINICAL DATA:  Pain post fall  EXAM: LUMBAR SPINE - COMPLETE 4+ VIEW  COMPARISON:  None.  FINDINGS: Osteopenia. Diffuse ankylosis in the lumbosacral spine. No definite fracture. Heavy aortoiliac arterial calcifications. Bilateral hip arthroplasty hardware partially seen.  IMPRESSION: 1.  No definite fracture or other acute bone abnormality. 2. 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   On: 08/06/2013 16:23   Dg Sacrum/coccyx  08/06/2013   CLINICAL DATA:  Fall, low back pain  EXAM: SACRUM AND COCCYX - 2+ VIEW  COMPARISON:  None.   FINDINGS: Osteopenia.  Sacrum is poorly visualized on the frontal radiographs. Suspected ankylosis.  No displaced sacrococcygeal fracture is seen on the lateral view.  Bilateral hip arthroplasties.  IMPRESSION: Osteopenia.  No displaced sacrococcygeal fracture is seen.   Electronically Signed   By: Charline Bills M.D.   On: 08/06/2013 16:40   Ct Head Wo Contrast  08/06/2013   CLINICAL DATA:  Larey Seat, pain  EXAM: CT HEAD WITHOUT CONTRAST  TECHNIQUE: Contiguous axial images were obtained from the base of the skull through the vertex without intravenous contrast.  COMPARISON:  None.  FINDINGS: Dental caries. Small retention cyst or polyp inferiorly in the right maxillary sinus. Fluid level in the right frontal sinus. Atherosclerotic and physiologic intracranial calcifications. Moderate parenchymal atrophy. There is no evidence of acute intracranial hemorrhage, brain edema, mass lesion, acute infarction, mass effect, or midline shift. Acute infarct may be inapparent on noncontrast CT. No other intra-axial abnormalities are seen, and the ventricles and sulci are within normal limits in size and symmetry. No abnormal extra-axial fluid collections or masses are identified. No significant calvarial abnormality.  IMPRESSION: 1. Negative for bleed or other acute intracranial process.   Electronically Signed   By: Oley Balm M.D.   On: 08/06/2013 14:59   Ct Angio Chest Pe W/cm &/or Wo Cm  08/06/2013   CLINICAL DATA:  History of fall complaining of back pain.  EXAM: CT ANGIOGRAPHY CHEST WITH CONTRAST  TECHNIQUE: Multidetector CT imaging of the chest was performed using the standard protocol during bolus administration of intravenous contrast. Multiplanar CT image reconstructions including MIPs were obtained to evaluate the vascular anatomy.  CONTRAST:  60mL OMNIPAQUE IOHEXOL 350 MG/ML SOLN  COMPARISON:  None.  FINDINGS: Mediastinum: No filling defects within the pulmonary arterial tree to suggest underlying  pulmonary embolism. Heart size is normal. There is no significant pericardial fluid, thickening or pericardial calcification. There is atherosclerosis of the thoracic aorta, the great vessels of the mediastinum and the coronary arteries, including calcified atherosclerotic plaque in the left main, left anterior descending, left circumflex and right  coronary arteries. Status post median sternotomy for CABG, including LIMA to the LAD. Aberrant right subclavian artery (normal anatomical variant) incidentally noted. No pathologically enlarged mediastinal or hilar lymph nodes. Esophagus is unremarkable in appearance.  Lungs/Pleura: Elevation of the left hemidiaphragm with associated areas of passive atelectasis in the left lower lobe and inferior segment of the lingula. There is also some dependent subsegmental atelectasis in the right lower lobe as well. No definite consolidative airspace disease. No pleural effusions. No suspicious appearing pulmonary nodules or masses are identified.  Upper Abdomen: Multiple renal lesions bilaterally. The largest on the right measures 1.4 cm extending exophytically from the upper pole of the right kidney, measuring 64 HU. The largest on the left measures 1.4 cm extending exophytically from the lateral aspect of the interpolar region of the left kidney and is low-attenuation (13 HU). Small calcifications associated with the left renal collecting system may represent nonobstructive calculi or vascular calcifications, ranging in size from 2-4 mm. Extensive atherosclerosis throughout the visualized abdominal vasculature.  Musculoskeletal: Status post median sternotomy. Multilevel degenerative disc disease, including chronic fusion of T5-T7. There are no aggressive appearing lytic or blastic lesions noted in the visualized portions of the skeleton.  Review of the MIP images confirms the above findings.  IMPRESSION: 1. No pulmonary embolism identified. 2. No acute findings in the thorax. 3.  Elevation of the left hemidiaphragm with passive atelectasis in portions of the left lower lobe and inferior segment of the lingula. There is also some mild dependent subsegmental atelectasis in the posterior aspect of the right lower lobe as well. 4. Atherosclerosis, including left main and 3 vessel coronary artery disease. Status post median sternotomy for CABG. 5. Multiple renal lesions bilaterally, incompletely characterized, as detailed above. These are favored to reflect simple cysts and proteinaceous cysts, but these warrant further evaluation with nonemergent renal ultrasound. 6. Additional incidental findings, as above.   Electronically Signed   By: Trudie Reed M.D.   On: 08/06/2013 18:02    CBC  Recent Labs Lab 08/06/13 1340 08/06/13 1358 08/07/13 0609  WBC 16.9*  --  12.0*  HGB 11.2* 13.3 10.2*  HCT 36.0* 39.0 33.4*  PLT 295  --  278  MCV 104.7*  --  103.4*  MCH 32.6  --  31.6  MCHC 31.1  --  30.5  RDW 15.3  --  15.1    Chemistries   Recent Labs Lab 08/06/13 1358 08/07/13 0609  NA 136 133*  K 4.4 5.2*  CL 96 93*  CO2  --  23  GLUCOSE 148* 103*  BUN 26* 36*  CREATININE 4.60* 5.81*  CALCIUM  --  8.5   ------------------------------------------------------------------------------------------------------------------ estimated creatinine clearance is 10.8 ml/min (by C-G formula based on Cr of 5.81). ------------------------------------------------------------------------------------------------------------------ No results found for this basename: HGBA1C,  in the last 72 hours ------------------------------------------------------------------------------------------------------------------ No results found for this basename: CHOL, HDL, LDLCALC, TRIG, CHOLHDL, LDLDIRECT,  in the last 72 hours ------------------------------------------------------------------------------------------------------------------ No results found for this basename: TSH, T4TOTAL, FREET3,  T3FREE, THYROIDAB,  in the last 72 hours ------------------------------------------------------------------------------------------------------------------ No results found for this basename: VITAMINB12, FOLATE, FERRITIN, TIBC, IRON, RETICCTPCT,  in the last 72 hours  Coagulation profile No results found for this basename: INR, PROTIME,  in the last 168 hours  No results found for this basename: DDIMER,  in the last 72 hours  Cardiac Enzymes No results found for this basename: CK, CKMB, TROPONINI, MYOGLOBIN,  in the last 168 hours ------------------------------------------------------------------------------------------------------------------ No components found with this basename:  POCBNP,

## 2013-08-07 NOTE — Consult Note (Signed)
Renal Service Consult Note [facility]  Nicholas [name] 08/07/2013 Nicholas [name] Requesting Physician:  Dr Catha Gosselin  Reason for Consult:  ESRD patient with fall HPI: The patient is a 72 y.o. year-old hx of CAD, CABG, HTN, DM, DJD, neuropathy, COPD and afib presented after falling at home. Pt states he was standing looking out the window when he passed out and fall flat on his back.  He became dizzy prior to falling. No CP or palp.  In ed had low SaO2. CT chest showed no fractures and negative for PE. Admitted for syncope workup.    Pt had recent surgery on R elbow for "cyst removal", arm is in a cast until 12/30.  Pt denies sob, cp, abd pain, n/v/d.     ROS  no problems at HD or with access  Past Medical History  Past Medical History  Diagnosis Date  . Coronary artery disease     a. hx of MI 1992; b. 10/2012 NSTEMI (no cath 2/2 GIB);  c. 11/2012 MV: Inf defect w inf and septal HK (favor prior MI), EF 47%;  d. 12/2012 Cath: Severe 3vd, EF 45-50%;  e. 01/2013 CABG x 5 (LIMA->LAD, VG->D1, VG->OM1->OM2, VG->PDA)  . Diabetes mellitus   . Hypertension   . Arthritis   . Carotid artery disease     a. 01/2013 doppler: >80% RICA stenosis, <39% LICA stenosis;  b. 01/2013 R CEA.  . Gastritis 2012 and 09/2011    treated for H Pylori in 12/2011  . Diabetic neuropathy   . Osteoporosis   . COPD (chronic obstructive pulmonary disease)   . Anxiety   . Depression   . ESRD (end stage renal disease)     M-W-Sa  Titus [name]  . Diabetic nephropathy   . GERD (gastroesophageal reflux disease)   . Neuromuscular disorder     peripheral neurotherapy  . Anemia   . A-fib     a. Dx 10/2012, on amio, no anticoagulation 2/2 GIB 10/2012.  [name] GIB (gastrointestinal bleeding)     a. 10/2012, awaiting colonoscopy.   Past Surgical History  Past Surgical History  Procedure Laterality Date  . Coronary stent placement    . Hip surgery      both hips  . Av fistula placement      Left arm  .  Cardiac catheterization    . Joint replacement Bilateral   . Eye surgery Bilateral     cararacts  . Coronary artery bypass graft N/A 02/03/2013    Procedure: CORONARY ARTERY BYPASS GRAFTING (CABG);  Surgeon: Loreli Slot, MD;  Location: [facility] OR;  Service: Open Heart Surgery;  Laterality: N/A;  . Endarterectomy N/A 02/03/2013    Procedure: ENDARTERECTOMY CAROTID;  Surgeon: Pryor Ochoa, MD;  Location: [facility] OR;  Service: Vascular;  Laterality: N/A;  . Olecranon bursectomy Right 07/28/2013    Procedure: RIGHT ELBOW OLECRANON BURSECTOMY ADVANCEMENT CLOSURE ;  Surgeon: Sharma Covert, MD;  Location: [facility] OR;  Service: Orthopedics;  Laterality: Right;   Family History  Family History  Problem Relation Age of Onset  . Heart attack Father 59  . Heart disease Father   . Stroke Mother   . Hypertension Mother   . Other Mother     varicose veins  . Stroke Maternal Grandmother   . Hypertension Brother    Social History  reports that he quit smoking about 22 years ago. His smoking use included Cigarettes. He started smoking about 48 years ago. He has  a 25 pack-year smoking history. He has never used smokeless tobacco. He reports that he drinks alcohol. He reports that he does not use illicit drugs. Allergies  Allergies  Allergen Reactions  . Statins Other (See Comments)    Muscle weakness    Home medications Prior to Admission medications   Medication Sig Start Date End Date Taking? Authorizing Provider  acetaminophen (TYLENOL) 500 MG tablet Take 500 mg by mouth every 4 (four) hours as needed (pain).    Yes Historical Provider, MD  allopurinol (ZYLOPRIM) 100 MG tablet Take 100 mg by mouth 2 (two) times daily.    Yes Historical Provider, MD  amiodarone (PACERONE) 200 MG tablet Take 200 mg by mouth daily.   Yes Historical Provider, MD  aspirin 81 MG tablet Take 324 mg by mouth daily.    Yes Historical Provider, MD  docusate sodium (COLACE) 100 MG capsule Take 100 mg by mouth 2 (two) times  daily.   Yes Historical Provider, MD  fish oil-omega-3 fatty acids 1000 MG capsule Take 1 g by mouth 2 (two) times daily.    Yes Historical Provider, MD  gabapentin (NEURONTIN) 300 MG capsule Take 1 capsule (300 mg total) by mouth 2 (two) times daily. 02/15/13  Yes Donielle Margaretann Loveless, PA-C  insulin aspart protamine- aspart (NOVOLOG MIX 70/30) (70-30) 100 UNIT/ML injection Inject 2 Units into the skin 3 (three) times daily with meals.   Yes Historical Provider, MD  insulin glargine (LANTUS) 100 UNIT/ML injection Inject 15 Units into the skin at bedtime. 10/27/12  Yes Vassie Loll, MD  ipratropium (ATROVENT) 0.06 % nasal spray Place 1 spray into the nose 3 (three) times daily.   Yes Historical Provider, MD  midodrine (PROAMATINE) 5 MG tablet Take 5 mg by mouth 3 (three) times a week. Take once daily in the morning on dialysis days (usually on Monday, Wednesday and Friday)   Yes Historical Provider, MD  Multiple Vitamins-Minerals (MULTIVITAMIN WITH MINERALS) tablet Take 1 tablet by mouth daily.    Yes Historical Provider, MD  multivitamin (RENA-VIT) TABS tablet Take 1 tablet by mouth at bedtime.    Yes Historical Provider, MD  oxyCODONE (OXY IR/ROXICODONE) 5 MG immediate release tablet Take 5 mg by mouth every 4 (four) hours as needed for severe pain.   Yes Historical Provider, MD  pantoprazole (PROTONIX) 40 MG tablet Take 40 mg by mouth daily.   Yes Historical Provider, MD  sevelamer carbonate (RENVELA) 800 MG tablet Take 800 mg by mouth 3 (three) times daily with meals.   Yes Historical Provider, MD  traMADol-acetaminophen (ULTRACET) 37.5-325 MG per tablet Take 1 tablet by mouth 3 (three) times daily as needed (pain).  08/01/13  Yes Historical Provider, MD  zolpidem (AMBIEN) 10 MG tablet Take 10 mg by mouth at bedtime.   Yes Historical Provider, MD   Liver Function Tests No results found for this basename: AST, ALT, ALKPHOS, BILITOT, PROT, ALBUMIN,  in the last 168 hours No results found for this  basename: LIPASE, AMYLASE,  in the last 168 hours CBC  Recent Labs Lab 08/06/13 1340 08/06/13 1358 08/07/13 0609  WBC 16.9*  --  12.0*  HGB 11.2* 13.3 10.2*  HCT 36.0* 39.0 33.4*  MCV 104.7*  --  103.4*  PLT 295  --  278   Basic Metabolic Panel  Recent Labs Lab 08/06/13 1358 08/07/13 0609  NA 136 133*  K 4.4 5.2*  CL 96 93*  CO2  --  23  GLUCOSE 148* 103*  BUN 26* 36*  CREATININE 4.60* 5.81*  CALCIUM  --  8.5    Exam  Blood pressure 120/73, pulse 87, temperature 98.6 F (37 C), temperature source Oral, resp. rate 18, height 5\' 6"  (1.676 m), weight 75.9 kg (167 lb 5.3 oz), SpO2 96.00%.  gen: elderly pleasant WM, R arm ace-wrapped, no distress  skin: no rash, cyanosis  heent: eomi, sclera anicteric, throat clear  neck: + mild jvd, no bruits  chest: clear bilat no rales or wheezes  cor: regular, no M or rub, no gallop, pedal pulses intact  abd: soft, obese, nt, nd, no mass, liver down 3 cm  ext: no leg or UE edema, no joint effusion, no gangrene/ulcers  neuro: alert, ox3, nonfocal  access: left upper arm AVF       Dialysis: [facility] 4h[facility]  F180  400/800  75kg   2K/2.25 bath  Prof 4   LFA AVF   Heparin 3000 Hectorol 1     Epo 6800 Last labs: Hb 11.0   tsat 15% ferritin 719 calc 9.4 phos 4.8  pth 296   Assessment/Plan: 1. Fall / syncope 2. ESRD: HD tomorrow 3. Anemia / CKD: Hb 10.2, cont esa with darbe  4. MBD / CKD: cont binders, vit D 5. Hypotension/volume: takes midodrine for BP support, 1kg up, no vol excess on exam 6. CAD hx CABG 7. Hx CEA 8. AFib 9. DM2    Vinson Moselle MD (pgr) [phone]    ([phone] 08/07/2013, 4:24 PM

## 2013-08-08 LAB — CBC
HCT: 34.8 % — ABNORMAL LOW (ref 39.0–52.0)
Hemoglobin: 10.8 g/dL — ABNORMAL LOW (ref 13.0–17.0)
MCV: 102.1 fL — ABNORMAL HIGH (ref 78.0–100.0)
RBC: 3.41 MIL/uL — ABNORMAL LOW (ref 4.22–5.81)
WBC: 13.1 10*3/uL — ABNORMAL HIGH (ref 4.0–10.5)

## 2013-08-08 LAB — LIPID PANEL
Cholesterol: 184 mg/dL (ref 0–200)
Total CHOL/HDL Ratio: 5 RATIO
Triglycerides: 167 mg/dL — ABNORMAL HIGH (ref <150)

## 2013-08-08 LAB — BASIC METABOLIC PANEL
BUN: 24 mg/dL — ABNORMAL HIGH (ref 6–23)
CO2: 26 mEq/L (ref 19–32)
Chloride: 92 mEq/L — ABNORMAL LOW (ref 96–112)
Creatinine, Ser: 3.85 mg/dL — ABNORMAL HIGH (ref 0.50–1.35)
Glucose, Bld: 98 mg/dL (ref 70–99)
Potassium: 3.6 mEq/L (ref 3.5–5.1)

## 2013-08-08 LAB — GLUCOSE, CAPILLARY: Glucose-Capillary: 127 mg/dL — ABNORMAL HIGH (ref 70–99)

## 2013-08-08 MED ORDER — HYDROMORPHONE HCL PF 1 MG/ML IJ SOLN
INTRAMUSCULAR | Status: AC
  Filled 2013-08-08: qty 1

## 2013-08-08 MED ORDER — SODIUM CHLORIDE 0.9 % IV SOLN: 100.0000 mL | INTRAVENOUS | Status: DC | PRN

## 2013-08-08 MED ORDER — ACETAMINOPHEN 325 MG PO TABS
ORAL_TABLET | ORAL | Status: AC
  Filled 2013-08-08: qty 2

## 2013-08-08 MED ORDER — HEPARIN SODIUM (PORCINE) 1000 UNIT/ML DIALYSIS
3000.0000 [IU] | Freq: Once | INTRAMUSCULAR | Status: AC
  Administered 2013-08-08: 3000 [IU] via INTRAVENOUS_CENTRAL
  Filled 2013-08-08: qty 3

## 2013-08-08 MED ORDER — DARBEPOETIN ALFA-POLYSORBATE 60 MCG/0.3ML IJ SOLN
INTRAMUSCULAR | Status: AC
  Filled 2013-08-08: qty 0.3

## 2013-08-08 MED ORDER — LIDOCAINE-PRILOCAINE 2.5-2.5 % EX CREA: 1.0000 "application " | TOPICAL_CREAM | CUTANEOUS | Status: DC | PRN

## 2013-08-08 MED ORDER — HEPARIN SODIUM (PORCINE) 1000 UNIT/ML DIALYSIS
1000.0000 [IU] | INTRAMUSCULAR | Status: DC | PRN
  Filled 2013-08-08: qty 1

## 2013-08-08 MED ORDER — SORBITOL 70 % SOLN
60.0000 mL | Status: DC | PRN
  Filled 2013-08-08: qty 60

## 2013-08-08 MED ORDER — RENA-VITE PO TABS
1.0000 | ORAL_TABLET | Freq: Every day | ORAL | Status: DC
  Filled 2013-08-08: qty 1

## 2013-08-08 MED ORDER — MIDODRINE HCL 5 MG PO TABS
ORAL_TABLET | ORAL | Status: AC
  Filled 2013-08-08: qty 1

## 2013-08-08 MED ORDER — LIDOCAINE HCL (PF) 1 % IJ SOLN: 5.0000 mL | INTRAMUSCULAR | Status: DC | PRN

## 2013-08-08 MED ORDER — OXYCODONE HCL 5 MG PO TABS: 5.0000 mg | ORAL_TABLET | ORAL | Status: DC | PRN

## 2013-08-08 MED ORDER — NEPRO/CARBSTEADY PO LIQD: 237.0000 mL | ORAL | Status: DC | PRN

## 2013-08-08 MED ORDER — DOXERCALCIFEROL 4 MCG/2ML IV SOLN
INTRAVENOUS | Status: AC
  Filled 2013-08-08: qty 2

## 2013-08-08 MED ORDER — ALTEPLASE 2 MG IJ SOLR
2.0000 mg | Freq: Once | INTRAMUSCULAR | Status: DC | PRN
  Filled 2013-08-08: qty 2

## 2013-08-08 MED ORDER — PENTAFLUOROPROP-TETRAFLUOROETH EX AERO: 1.0000 "application " | INHALATION_SPRAY | CUTANEOUS | Status: DC | PRN

## 2013-08-08 NOTE — Progress Notes (Signed)
CSW compiled discharge packet and placed this on pt's chart. CSW called [facility] to arrange for transportation between 4:45-5:00, at RN's request. CSW signing off.   Maryclare Labrador, MSW, [facility] Clinical Social Worker [phone]

## 2013-08-08 NOTE — Progress Notes (Signed)
Clinical Social Work Department BRIEF PSYCHOSOCIAL ASSESSMENT 08/08/2013  Patient:  Nicholas [name], Nicholas [name]     Account Number:  [id number]     Admit date:  08/06/2013  Clinical Social Worker:  Varney Biles  Date/Time:  08/08/2013 04:15 PM  Referred by:  Physician  Date Referred:  08/08/2013 Referred for  SNF Placement   Other Referral:   Interview type:  Patient Other interview type:    PSYCHOSOCIAL DATA Living Status:  ALONE Admitted from facility:   Level of care:   Primary support name:  Suhan Paci [phone]) Primary support relationship to patient:  SIBLING Degree of support available:   Good--pt's brother is actively involved in his care and called pt while CSW was completing assessment.    CURRENT CONCERNS Current Concerns  Post-Acute Placement   Other Concerns:    SOCIAL WORK ASSESSMENT / PLAN CSW received consult that pt was requesting SNF. CSW faxed out to SNFs in [facility] and pt got bed offers from four facilities. CSW presented these to pt and he selected [facility] after speaking with Alvino Chapel, liaison for this facility. Pt discharged to SNF today.   Assessment/plan status:  No Further Intervention Required Other assessment/ plan:   Information/referral to community resources:   [employer].    PATIENT'S/FAMILY'S RESPONSE TO PLAN OF CARE: Pt receptive to CSW visit and visit from [place].       Maryclare Labrador, MSW, [facility] Clinical Social Worker [phone]

## 2013-08-08 NOTE — Progress Notes (Signed)
Orthostatic BP ordered to be taken, patient refused due to pain in the back and states '' I can't sit up or get up'', MD notified. Will continue to monitor.

## 2013-08-08 NOTE — Evaluation (Signed)
Physical Therapy Evaluation Patient Details Name: Nicholas [name] MRN: [id number] DOB: [birth year] Today's Date: 08/08/2013 Time: 1340-1409 PT Time Calculation (min): 29 min  PT Assessment / Plan / Recommendation History of Present Illness    72 yo male admitted with syncopal episode. CT negative   Clinical Impression  Pt adm due to the above. Pt presents with decreased independence with functional mobility secondary to deficits listed below (see PT problem list). Pt to benefit from skilled PT in acute setting to address deficits indicated below and help pt return to PLOF, which was independent. Pt required 2+ (A) to transfer bed to chair today due to back pain. Pt is agreeable to SNF for post acute rehab at [facility] in [facility].     PT Assessment  Patient needs continued PT services    Follow Up Recommendations  SNF;Supervision/Assistance - 24 hour    Does the patient have the potential to tolerate intense rehabilitation      Barriers to Discharge Decreased caregiver support pt lives alone     Equipment Recommendations  Other (comment) (TBD)    Recommendations for Other Services     Frequency Min 2X/week    Precautions / Restrictions Precautions Precautions: Fall Precaution Comments: recent fall due to syncopal episode  Restrictions Weight Bearing Restrictions: No   Pertinent Vitals/Pain 10/10 in back. patient repositioned for comfort       Mobility  Bed Mobility Bed Mobility: Rolling Left;Left Sidelying to Sit;Supine to Sit;Sitting - Scoot to Edge of Bed Rolling Left: 2: Max assist Left Sidelying to Sit: 2: Max assist;HOB flat;With rails Supine to Sit: 1: +2 Total assist;With rails Supine to Sit: Patient Percentage: 40% Sitting - Scoot to Edge of Bed: 2: Max assist Details for Bed Mobility Assistance: pt required (A) and cues to perform log rolling technique to protect back; max (A) for sequencing; use of draw pad to bring LEs off EOB and relied heaviliy on  handrails Transfers Transfers: Sit to Stand;Stand to Sit;Stand Pivot Transfers Sit to Stand: 1: +2 Total assist;With upper extremity assist;From bed Sit to Stand: Patient Percentage: 40% Stand to Sit: 1: +2 Total assist;With upper extremity assist;To chair/3-in-1 Stand to Sit: Patient Percentage: 40% Stand Pivot Transfers: 1: +2 Total assist;From elevated surface;With armrests Stand Pivot Transfers: Patient Percentage: 40% Details for Transfer Assistance: pt requires (A) to balance and demonstrates a strong posterior lean. Pt very anxious with transfer. pt needed v/c for sequence and static standing. pt requesting to sit ~15 with static standing and transfered to chair. pt not stepping with bil LE to advance to the chair. pt needed (A) to complete LE advancement Ambulation/Gait Ambulation/Gait Assistance: Not tested (comment) Stairs: No Wheelchair Mobility Wheelchair Mobility: No         PT Diagnosis: Difficulty walking;Generalized weakness;Acute pain  PT Problem List: Decreased strength;Decreased activity tolerance;Decreased balance;Decreased mobility;Decreased knowledge of use of DME;Decreased safety awareness;Pain PT Treatment Interventions: DME instruction;Gait training;Functional mobility training;Therapeutic activities;Therapeutic exercise;Neuromuscular re-education;Balance training;Patient/family education     PT Goals(Current goals can be found in the care plan section) Acute Rehab PT Goals Patient Stated Goal: to go to [facility] in [place] PT Goal Formulation: With patient Time For Goal Achievement: 08/22/13 Potential to Achieve Goals: Good  Visit Information  Last PT Received On: 08/08/13 Assistance Needed: +1 PT/OT/SLP Co-Evaluation/Treatment: Yes Reason for Co-Treatment: For patient/therapist safety PT goals addressed during session: Mobility/safety with mobility OT goals addressed during session: ADL's and self-care       Prior Functioning  Home  Living Family/patient expects to be discharged to:: Skilled nursing facility Living Arrangements: Alone Home Equipment: Dan Humphreys - 2 wheels;Cane - single point Additional Comments: pt is unsafe to d/c home at this time. Pt has no family assistance. Pt must drive himself to HD x3 per week . Pt reports leaving home at 10:30 AM and not returning until after 17:00 PM. Pt with Rt UE in a splint preventing elbow flexion Prior Function Level of Independence: Independent with assistive device(s) Comments: ambulates with RW inside his home and cane in the community. Pt must drive to HD and all aspects of meal / house care (grocery store, post office etc) Communication Communication: No difficulties Dominant Hand: Right    Cognition  Cognition Arousal/Alertness: Awake/alert Behavior During Therapy: WFL for tasks assessed/performed Overall Cognitive Status: Within Functional Limits for tasks assessed    Extremity/Trunk Assessment Upper Extremity Assessment Upper Extremity Assessment: Defer to OT evaluation RUE Deficits / Details: resticted Rt Elbow flexion due to splint Lower Extremity Assessment Lower Extremity Assessment: Generalized weakness (c/o back pain with MMT ) Cervical / Trunk Assessment Cervical / Trunk Assessment: Kyphotic   Balance Balance Balance Assessed: Yes Static Sitting Balance Static Sitting - Balance Support: Feet supported;Bilateral upper extremity supported Static Sitting - Level of Assistance: 4: Min assist;5: Stand by assistance Static Sitting - Comment/# of Minutes: initially required min (A) and was pushing posteriorly due to back pain; progressed to SBA; tolerated sitting ~10 min   End of Session PT - End of Session Equipment Utilized During Treatment: Gait belt;Oxygen;Other (comment) (3L O2) Activity Tolerance: Patient limited by pain;Patient limited by fatigue Patient left: in chair;with call bell/phone within reach Nurse Communication: Mobility status;Precautions   GP Functional Assessment Tool Used: clinical judgement  Functional Limitation: Mobility: Walking and moving around Mobility: Walking and Moving Around Current Status (Z6109): At least 80 percent but less than 100 percent impaired, limited or restricted Mobility: Walking and Moving Around Goal Status [phone]): 0 percent impaired, limited or restricted   Donell Sievert, [place] [phone] 08/08/2013, 3:06 PM

## 2013-08-08 NOTE — Procedures (Signed)
I was present at this session.  I have reviewed the session itself and made appropriate changes.  Hd going well, immobile, bp stable, access press ok.  Janaria Mccammon,Karee L 12/22/20148:47 AM

## 2013-08-08 NOTE — Discharge Summary (Signed)
Physician Discharge Summary  Nicholas [name] ZOX:[id number] DOB: 01-19-1941 DOA: 08/06/2013  PCP: Daniel Nones  Admit date: 08/06/2013 Discharge date: 08/08/2013  Time spent: 35 minutes  Recommendations for Outpatient Follow-up:  Patient will follow up with his primary care physician within one week of discharge. He should also follow with the physician at the nursing home. Patient should continue physical therapy as set forth by the rehabilitation facility. He should continue taking all his medications as prescribed.   Discharge Diagnoses:  Principal Problem:  Presyncopal episode, likely vasovagal Active Problems:   ESRD (end stage renal disease) on dialysis   DM (diabetes mellitus)   CAD (coronary artery disease)   Bursitis of elbow   Hypoxia secondary to COPD and not oxygen compliant   Intractable low back pain s/p fall   Systolic CHF   Leukocytosis   Discharge Condition: Stable  Diet recommendation: Renal, cardiac healthy  Filed Weights   08/07/13 2056 08/08/13 0655 08/08/13 1100  Weight: 77.928 kg (171 lb 12.8 oz) 78.9 kg (173 lb 15.1 oz) 76.3 kg (168 lb 3.4 oz)    History of present illness:  Nicholas [name] is a 72 y.o. male who was sent to the ED after he reports that he became dizzy and passed out and landed on his back. He reports having increased pain in his lower back afterward, and was found to have hypoxemia in the ED with O2 sats of 80% . He was placed on supplemental O2 and improved. He was sent for a CTA of the Chest as well as imaging studies of his head Neck and lumbosacral spine. There were no Fractures found, and his CTA of the Chest was negative for a PE. He was referred for medical admission.    Hospital Course:  This is a 72 year old male who has a history of CHF, diabetes mellitus, end-stage renal disease requiring hemodialysis that presents to the emergency room after falling. Patient stated he was getting has been done he simply fell backwards. He  denied feeling dizzy, or experiencing any lightheadedness. CT of the head was conducted and was found to be negative for bleed or acute intracranial process. Neuro checks were conducted and were normal. Telemetry found no tachycardia or bradycardia arrhythmias. Patient would not allow [place] to conduct orthostatic vitals as he was in too much pain. Patient was also complaining of intractable back pain status post this fall. X-ray of the lumbar sacral spine were negative for fracture. Patient was given pain control. Physical therapy was also consulted for intervention and management however patient was not compliant due to pain. Patient also found to be hypoxic upon admission. This is likely secondary to COPD. He is supposed to use oxygen at home however denies using any last few months. CT of the chest was also done and negative primary embolus. Patient was continued on Toprol oxygen as well as nebulizer treatments. Patient did have dialysis today as he is a Monday Wednesday Friday dialysis patient. Nephrology was monitoring this. Patient was also found to have uncontrolled hypertension, he does use Minitran. This is likely secondary to his pain. Patient does have a history of coronary artery disease and underwent CABG in July 2014. At this time he is on an ACE inhibitor or statin. Attempted to contact his primary care physician however was unavailable. Fasting lipid panel was also conducted however still pending. Patient does have a history of systolic CHF. He did not appear to be volume overloaded. Again dialysis was continued. Patient was placed  on fluid restriction along with daily weights and monitor his input and output. Patient was also found to have leukocytosis which is likely an acute phase reactant. He remained afebrile. His chest x-ray was negative for infiltrate. Blood cultures and urinalysis were also negative. Leukocytosis was trending downward. Patient will be discharged to nursing home facility. He is  to follow physical therapy as dictated by the rehabilitation center.  He should follow up with his primary care physician within one week of discharge along with the physician at the nursing home.  Patient should continue his medications as prescribed.  Procedures: None  Consultations: Nephrology  Discharge Exam: Filed Vitals:   08/08/13 1231  BP: 134/72  Pulse: 87  Temp: 98.2 F (36.8 C)  Resp: 18   Exam  General: Well developed, well nourished, NAD, appears stated age  HEENT: NCAT, PERRLA, EOMI,  mucous membranes moist.  Neck: Supple, no JVD, no masses  Cardiovascular: S1 S2 auscultated, 1/6 SEM. Regular rate and rhythm.  Respiratory: Clear to auscultation bilaterally with equal chest rise  Abdomen: Soft, nontender, nondistended, + bowel sounds  Extremities: warm dry without cyanosis clubbing or edema  Neuro: AAOx3, cranial nerves grossly intact.  Skin: Without rashes exudates or nodules  Psych: Normal affect and demeanor with intact judgement and insight  Discharge Instructions  Discharge Orders   Future Appointments Provider Department Dept Phone   09/20/2013 1:00 PM [facility] [address] [phone]   09/20/2013 2:00 PM Pryor Ochoa, MD [facility] [phone]   Future Orders Complete By Expires   Discharge instructions  As directed    Comments:     Patient will follow up with his primary care physician within one week of discharge. He should also follow with the physician at the nursing home. Patient should continue physical therapy as set forth by the rehabilitation facility. He should continue taking all his medications as prescribed.   Increase activity slowly  As directed        Medication List         acetaminophen 500 MG tablet  Commonly known as:  TYLENOL  Take 500 mg by mouth every 4 (four) hours as needed (pain).     allopurinol 100 MG tablet  Commonly known as:  ZYLOPRIM  Take 100 mg by  mouth 2 (two) times daily.     amiodarone 200 MG tablet  Commonly known as:  PACERONE  Take 200 mg by mouth daily.     aspirin 81 MG tablet  Take 324 mg by mouth daily.     docusate sodium 100 MG capsule  Commonly known as:  COLACE  Take 100 mg by mouth 2 (two) times daily.     fish oil-omega-3 fatty acids 1000 MG capsule  Take 1 g by mouth 2 (two) times daily.     gabapentin 300 MG capsule  Commonly known as:  NEURONTIN  Take 1 capsule (300 mg total) by mouth 2 (two) times daily.     insulin aspart protamine- aspart (70-30) 100 UNIT/ML injection  Commonly known as:  NOVOLOG MIX 70/30  Inject 2 Units into the skin 3 (three) times daily with meals.     insulin glargine 100 UNIT/ML injection  Commonly known as:  LANTUS  Inject 15 Units into the skin at bedtime.     ipratropium 0.06 % nasal spray  Commonly known as:  ATROVENT  Place 1 spray into the nose 3 (three) times daily.  midodrine 5 MG tablet  Commonly known as:  PROAMATINE  Take 5 mg by mouth 3 (three) times a week. Take once daily in the morning on dialysis days (usually on Monday, Wednesday and Friday)     multivitamin Tabs tablet  Take 1 tablet by mouth at bedtime.     multivitamin with minerals tablet  Take 1 tablet by mouth daily.     oxyCODONE 5 MG immediate release tablet  Commonly known as:  Oxy IR/ROXICODONE  Take 1 tablet (5 mg total) by mouth every 4 (four) hours as needed for severe pain.     pantoprazole 40 MG tablet  Commonly known as:  PROTONIX  Take 40 mg by mouth daily.     sevelamer carbonate 800 MG tablet  Commonly known as:  RENVELA  Take 800 mg by mouth 3 (three) times daily with meals.     traMADol-acetaminophen 37.5-325 MG per tablet  Commonly known as:  ULTRACET  Take 1 tablet by mouth 3 (three) times daily as needed (pain).     zolpidem 10 MG tablet  Commonly known as:  AMBIEN  Take 10 mg by mouth at bedtime.       Allergies  Allergen Reactions  . Statins Other (See  Comments)    Muscle weakness        Follow-up Information   Follow up with KLEIN, BERT. Schedule an appointment as soon as possible for a visit in 1 week.   Specialty:  Internal Medicine   Contact information:   [facility] [address] [place] [address] [phone]       Follow up with  Physician at the nursinng home In 2 days.       The results of significant diagnostics from this hospitalization (including imaging, microbiology, ancillary and laboratory) are listed below for reference.    Significant Diagnostic Studies: Dg Chest 2 View  07/21/2013   CLINICAL DATA:  History of CAD, diabetes, hypertension and COPD, AFib. Right elbow olecranon bursectomy advancement closure.  EXAM: CHEST  2 VIEW  COMPARISON:  03/29/2013  FINDINGS: The patient has had median sternotomy and CABG. The heart is mildly enlarged. There is elevation of left hemidiaphragm. Left lung base atelectasis or scar is noted. There are no focal consolidations or pleural effusions. No pulmonary edema. Note is made of chronic changes in both shoulders.  IMPRESSION: 1. Cardiomegaly without pulmonary edema. 2. Stable changes at the left lung base.   Electronically Signed   By: Rosalie Gums M.D.   On: 07/21/2013 14:28   Dg Cervical Spine Complete  08/06/2013   CLINICAL DATA:  Neck pain  EXAM: CERVICAL SPINE  4+ VIEWS  COMPARISON:  None.  FINDINGS: Cervical spine is visualized to C5-6 on the lateral view. Lateral swimmer's view is over penetrated.  No evidence of fracture dislocation. Vertebral body heights are maintained. Dens is poorly visualized on the attempted odontoid view.  No prevertebral soft tissue swelling.  Visualized lungs are notable for a postsurgical changes in the left hemithorax with suspected volume loss.  IMPRESSION: Limited study. Dens is poorly visualized. Lower cervical spine is not well visualized on the lateral view.  No gross fracture is seen to C5-6.   Electronically Signed   By:  Charline Bills M.D.   On: 08/06/2013 16:39   Dg Lumbar Spine Complete  08/06/2013   CLINICAL DATA:  Pain post fall  EXAM: LUMBAR SPINE - COMPLETE 4+ VIEW  COMPARISON:  None.  FINDINGS: Osteopenia. Diffuse ankylosis  in the lumbosacral spine. No definite fracture. Heavy aortoiliac arterial calcifications. Bilateral hip arthroplasty hardware partially seen.  IMPRESSION: 1.  No definite fracture or other acute bone abnormality. 2. 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   On: 08/06/2013 16:23   Dg Sacrum/coccyx  08/06/2013   CLINICAL DATA:  Fall, low back pain  EXAM: SACRUM AND COCCYX - 2+ VIEW  COMPARISON:  None.  FINDINGS: Osteopenia.  Sacrum is poorly visualized on the frontal radiographs. Suspected ankylosis.  No displaced sacrococcygeal fracture is seen on the lateral view.  Bilateral hip arthroplasties.  IMPRESSION: Osteopenia.  No displaced sacrococcygeal fracture is seen.   Electronically Signed   By: Charline Bills M.D.   On: 08/06/2013 16:40   Ct Head Wo Contrast  08/06/2013   CLINICAL DATA:  Larey Seat, pain  EXAM: CT HEAD WITHOUT CONTRAST  TECHNIQUE: Contiguous axial images were obtained from the base of the skull through the vertex without intravenous contrast.  COMPARISON:  None.  FINDINGS: Dental caries. Small retention cyst or polyp inferiorly in the right maxillary sinus. Fluid level in the right frontal sinus. Atherosclerotic and physiologic intracranial calcifications. Moderate parenchymal atrophy. There is no evidence of acute intracranial hemorrhage, brain edema, mass lesion, acute infarction, mass effect, or midline shift. Acute infarct may be inapparent on noncontrast CT. No other intra-axial abnormalities are seen, and the ventricles and sulci are within normal limits in size and symmetry. No abnormal extra-axial fluid collections or masses are identified. No significant calvarial abnormality.  IMPRESSION: 1.  Negative for bleed or other acute intracranial process.   Electronically Signed   By: Oley Balm M.D.   On: 08/06/2013 14:59   Ct Angio Chest Pe W/cm &/or Wo Cm  08/06/2013   CLINICAL DATA:  History of fall complaining of back pain.  EXAM: CT ANGIOGRAPHY CHEST WITH CONTRAST  TECHNIQUE: Multidetector CT imaging of the chest was performed using the standard protocol during bolus administration of intravenous contrast. Multiplanar CT image reconstructions including MIPs were obtained to evaluate the vascular anatomy.  CONTRAST:  60mL OMNIPAQUE IOHEXOL 350 MG/ML SOLN  COMPARISON:  None.  FINDINGS: Mediastinum: No filling defects within the pulmonary arterial tree to suggest underlying pulmonary embolism. Heart size is normal. There is no significant pericardial fluid, thickening or pericardial calcification. There is atherosclerosis of the thoracic aorta, the great vessels of the mediastinum and the coronary arteries, including calcified atherosclerotic plaque in the left main, left anterior descending, left circumflex and right coronary arteries. Status post median sternotomy for CABG, including LIMA to the LAD. Aberrant right subclavian artery (normal anatomical variant) incidentally noted. No pathologically enlarged mediastinal or hilar lymph nodes. Esophagus is unremarkable in appearance.  Lungs/Pleura: Elevation of the left hemidiaphragm with associated areas of passive atelectasis in the left lower lobe and inferior segment of the lingula. There is also some dependent subsegmental atelectasis in the right lower lobe as well. No definite consolidative airspace disease. No pleural effusions. No suspicious appearing pulmonary nodules or masses are identified.  Upper Abdomen: Multiple renal lesions bilaterally. The largest on the right measures 1.4 cm extending exophytically from the upper pole of the right kidney, measuring 64 HU. The largest on the left measures 1.4 cm extending exophytically from the  lateral aspect of the interpolar region of the left kidney and is low-attenuation (13 HU). Small calcifications associated with the left renal collecting system may represent nonobstructive calculi or vascular calcifications,  ranging in size from 2-4 mm. Extensive atherosclerosis throughout the visualized abdominal vasculature.  Musculoskeletal: Status post median sternotomy. Multilevel degenerative disc disease, including chronic fusion of T5-T7. There are no aggressive appearing lytic or blastic lesions noted in the visualized portions of the skeleton.  Review of the MIP images confirms the above findings.  IMPRESSION: 1. No pulmonary embolism identified. 2. No acute findings in the thorax. 3. Elevation of the left hemidiaphragm with passive atelectasis in portions of the left lower lobe and inferior segment of the lingula. There is also some mild dependent subsegmental atelectasis in the posterior aspect of the right lower lobe as well. 4. Atherosclerosis, including left main and 3 vessel coronary artery disease. Status post median sternotomy for CABG. 5. Multiple renal lesions bilaterally, incompletely characterized, as detailed above. These are favored to reflect simple cysts and proteinaceous cysts, but these warrant further evaluation with nonemergent renal ultrasound. 6. Additional incidental findings, as above.   Electronically Signed   By: Trudie Reed M.D.   On: 08/06/2013 18:02    Microbiology: No results found for this or any previous visit (from the past 240 hour(s)).   Labs: Basic Metabolic Panel:  Recent Labs Lab 08/06/13 1358 08/07/13 0609 08/08/13 0500  NA 136 133* 133*  K 4.4 5.2* 3.6  CL 96 93* 92*  CO2  --  23 26  GLUCOSE 148* 103* 98  BUN 26* 36* 24*  CREATININE 4.60* 5.81* 3.85*  CALCIUM  --  8.5 8.3*   Liver Function Tests: No results found for this basename: AST, ALT, ALKPHOS, BILITOT, PROT, ALBUMIN,  in the last 168 hours No results found for this basename:  LIPASE, AMYLASE,  in the last 168 hours No results found for this basename: AMMONIA,  in the last 168 hours CBC:  Recent Labs Lab 08/06/13 1340 08/06/13 1358 08/07/13 0609 08/08/13 0500  WBC 16.9*  --  12.0* 13.1*  HGB 11.2* 13.3 10.2* 10.8*  HCT 36.0* 39.0 33.4* 34.8*  MCV 104.7*  --  103.4* 102.1*  PLT 295  --  278 299   Cardiac Enzymes: No results found for this basename: CKTOTAL, CKMB, CKMBINDEX, TROPONINI,  in the last 168 hours BNP: BNP (last 3 results)  Recent Labs  10/20/12 0320  PROBNP 38564.0*   CBG:  Recent Labs Lab 08/07/13 0810 08/07/13 1139 08/07/13 1632 08/07/13 2057 08/08/13 1220  GLUCAP 116* 120* 103* 117* 127*       Signed:  Kyriana Yankee  [facility] 08/08/2013, 2:04 PM

## 2013-08-08 NOTE — Progress Notes (Signed)
Clinical Social Work Department CLINICAL SOCIAL WORK PLACEMENT NOTE 08/08/2013  Patient:  Nicholas [name], Nicholas [name]  Account Number:  [id number] Admit date:  08/06/2013  Clinical Social Worker:  Maryclare Labrador, Theresia Majors  Date/time:  08/08/2013 04:18 PM  Clinical Social Work is seeking post-discharge placement for this patient at the following level of care:   SKILLED NURSING   (*CSW will update this form in Epic as items are completed)   08/08/2013  Patient/family provided with [facility] Department of Clinical Social Work's list of facilities offering this level of care within the geographic area requested by the patient (or if unable, by the patient's family).  08/08/2013  Patient/family informed of their freedom to choose among providers that offer the needed level of care, that participate in Medicare, Medicaid or managed care program needed by the patient, have an available bed and are willing to accept the patient.  08/08/2013  Patient/family informed of [facility]' ownership interest in [facility], as well as of the fact that they are under no obligation to receive care at this facility.  PASARR submitted to EDS on 08/08/2013 PASARR number received from EDS on 08/08/2013  FL2 transmitted to all facilities in geographic area requested by pt/family on  08/08/2013 FL2 transmitted to all facilities within larger geographic area on   Patient informed that his/her managed care company has contracts with or will negotiate with  certain facilities, including the following:     Patient/family informed of bed offers received:  08/08/2013 Patient chooses bed at [facility] Physician recommends and patient chooses bed at    Patient to be transferred to [facility] on  08/08/2013 Patient to be transferred to facility by   The following physician request were entered in Epic:   Additional Comments:   Maryclare Labrador, MSW, [facility] Clinical Social  Worker [phone]

## 2013-08-08 NOTE — Evaluation (Signed)
Occupational Therapy Evaluation Patient Details Name: Nicholas [name] MRN: [id number] DOB: December 04, 1940 Today's Date: 08/08/2013 Time: 4098-1191 OT Time Calculation (min): 18 min  OT Assessment / Plan / Recommendation History of present illness  72 yo male admitted with syncopal episode. CT negative   Clinical Impression   PT admitted with syncope. Pt currently with functional limitiations due to the deficits listed below (see OT problem list).  Pt will benefit from skilled OT to increase their independence and safety with adls and balance to allow discharge SNF. Pt has no family assistance for d/c planning. Pt lives alone. Pt is high fall risk. Pt reports inability to manage ADLS at baseline.     OT Assessment  Patient needs continued OT Services    Follow Up Recommendations  SNF;Supervision/Assistance - 24 hour    Barriers to Discharge Decreased caregiver support pt lives alone iwth no assistance per patient  Equipment Recommendations  None recommended by OT    Recommendations for Other Services    Frequency  Min 2X/week    Precautions / Restrictions Precautions Precautions: Fall Precaution Comments: recent fall due to syncopal episode  Restrictions Weight Bearing Restrictions: No   Pertinent Vitals/Pain 10 out 10 pain Premedicated C/o back pain    ADL  Eating/Feeding: Set up Where Assessed - Eating/Feeding: Chair Lower Body Dressing: Moderate assistance Where Assessed - Lower Body Dressing: Supported sit to Pharmacist, hospital: +1 Total assistance Toilet Transfer Method: Sit to stand Toilet Transfer Equipment: Raised toilet seat with arms (or 3-in-1 over toilet) Toileting - Clothing Manipulation and Hygiene: +2 Total assistance Toileting - Clothing Manipulation and Hygiene: Patient Percentage: 40% Where Assessed - Toileting Clothing Manipulation and Hygiene: Sit to stand from 3-in-1 or toilet Equipment Used: Gait belt Transfers/Ambulation Related to ADLs: Pt  required total+2 person (A) to stand pivot to chair. Pt would be unable to complete transfer at home. Pt high fall risk ADL Comments: pt reluctant to complete transfer. Pt required total+2 (A) to EOB and the chair. Pt is not safe to return home at this time. If patient returns home he will ahve to manage all aspects of care including meal prep. Pt is unabel to static stand for meal prep.     OT Diagnosis: Generalized weakness;Acute pain  OT Problem List: Decreased strength;Decreased activity tolerance;Impaired balance (sitting and/or standing);Decreased safety awareness;Decreased knowledge of use of DME or AE;Decreased knowledge of precautions;Pain OT Treatment Interventions: Self-care/ADL training;DME and/or AE instruction;Therapeutic activities;Patient/family education;Balance training   OT Goals(Current goals can be found in the care plan section) Acute Rehab OT Goals Patient Stated Goal: to go to [facility] in [place] OT Goal Formulation: With patient Time For Goal Achievement: 08/22/13 Potential to Achieve Goals: Good  Visit Information  Last OT Received On: 08/08/13 Assistance Needed: +1 PT/OT/SLP Co-Evaluation/Treatment: Yes Reason for Co-Treatment: For patient/therapist safety OT goals addressed during session: ADL's and self-care       Prior Functioning     Home Living Family/patient expects to be discharged to:: Skilled nursing facility Living Arrangements: Alone Home Equipment: Dan Humphreys - 2 wheels;Cane - single point Additional Comments: pt is unsafe to d/c home at this time. Pt has no family assistance. Pt must drive himself to HD x3 per week . Pt reports leaving home at 10:30 AM and not returning until after 17:00 PM. Pt with Rt UE in a splint preventing elbow flexion Prior Function Level of Independence: Independent with assistive device(s) Comments: ambulates with RW inside his home and cane in the community.  Pt must drive to HD and all aspects of meal / house care  (grocery store, post office etc) Communication Communication: No difficulties Dominant Hand: Right         Vision/Perception Vision - History Baseline Vision: Wears glasses only for reading Patient Visual Report: No change from baseline   Cognition  Cognition Arousal/Alertness: Awake/alert Behavior During Therapy: WFL for tasks assessed/performed Overall Cognitive Status: Within Functional Limits for tasks assessed    Extremity/Trunk Assessment Upper Extremity Assessment Upper Extremity Assessment: RUE deficits/detail RUE Deficits / Details: resticted Rt Elbow flexion due to splint Lower Extremity Assessment Lower Extremity Assessment: Defer to PT evaluation Cervical / Trunk Assessment Cervical / Trunk Assessment: Kyphotic     Mobility Bed Mobility Bed Mobility: Rolling Left;Left Sidelying to Sit;Supine to Sit;Sitting - Scoot to Edge of Bed Rolling Left: 2: Max assist ((A) to sequence) Left Sidelying to Sit: 2: Max assist;HOB flat;With rails Supine to Sit: 1: +2 Total assist;With rails Supine to Sit: Patient Percentage: 40% Sitting - Scoot to Edge of Bed: 2: Max assist Details for Bed Mobility Assistance: Pt required (A) of pad and v/c for sequencing task. Pt required use of bed rails and pt does not have bed rails at home. ` Transfers Transfers: Sit to Stand;Stand to Sit Sit to Stand: 1: +2 Total assist;With upper extremity assist;From bed Sit to Stand: Patient Percentage: 40% Stand to Sit: 1: +2 Total assist;With upper extremity assist;To chair/3-in-1 Stand to Sit: Patient Percentage: 40% Details for Transfer Assistance: pt requires (A) to balance and demonstrates a strong posterior lean. Pt very anxious with transfer. pt needed v/c for sequence and static standing. pt requesting to sit ~15 with static standing and transfered to chair. pt not stepping with bil LE to advance to the chair. pt needed (A) to complete LE advancement     Exercise     Balance     End of  Session OT - End of Session Activity Tolerance: Patient tolerated treatment well Patient left: in chair;with call bell/phone within reach Nurse Communication: Mobility status;Precautions  GO Functional Assessment Tool Used: clinical judgement Functional Limitation: Self care Self Care Current Status (Z6109): At least 40 percent but less than 60 percent impaired, limited or restricted Self Care Goal Status (U0454): At least 40 percent but less than 60 percent impaired, limited or restricted   Nicholas [name] 08/08/2013, 2:41 PM Pager: [phone]

## 2013-08-08 NOTE — Progress Notes (Signed)
OT Cancellation Note  Patient Details Name: Nicholas [name] MRN: [id number] DOB: Oct 19, 1940   Cancelled Treatment:    Reason Eval/Treat Not Completed: Patient at procedure or test/ unavailable. Pt currently in HD   Harolyn Rutherford 08/08/2013, 9:06 AM Pager: [phone]

## 2013-08-08 NOTE — Progress Notes (Signed)
Patient awaiting discharge to [facility] by ambulance. Report was called at 1600. Patients belongings are with patient. Patient was stable upon discharge.

## 2013-08-08 NOTE — Progress Notes (Signed)
Subjective:  Seen on dialysis, low back pain sec to fall on 12/19, no other complaints. constip  Objective: Vital signs in last 24 hours: Temp:  [97.7 F (36.5 C)-98.6 F (37 C)] 97.7 F (36.5 C) (12/22 0655) Pulse Rate:  [74-91] 78 (12/22 0700) Resp:  [17-21] 18 (12/22 0655) BP: (91-182)/(45-143) 131/97 mmHg (12/22 0700) SpO2:  [85 %-96 %] 95 % (12/22 0655) Weight:  [77.928 kg (171 lb 12.8 oz)-78.9 kg (173 lb 15.1 oz)] 78.9 kg (173 lb 15.1 oz) (12/22 0655) Weight change: 2.028 kg (4 lb 7.5 oz)  Intake/Output from previous day: 12/21 0701 - 12/22 0700 In: 960 [P.O.:960] Out: -    Lab Results:  Recent Labs  08/06/13 1340 08/06/13 1358 08/07/13 0609  WBC 16.9*  --  12.0*  HGB 11.2* 13.3 10.2*  HCT 36.0* 39.0 33.4*  PLT 295  --  278   BMET:  Recent Labs  08/06/13 1358 08/07/13 0609  NA 136 133*  K 4.4 5.2*  CL 96 93*  CO2  --  23  GLUCOSE 148* 103*  BUN 26* 36*  CREATININE 4.60* 5.81*  CALCIUM  --  8.5   No results found for this basename: PTH,  in the last 72 hours Iron Studies: No results found for this basename: IRON, TIBC, TRANSFERRIN, FERRITIN,  in the last 72 hours  EXAM: General appearance:  Alert, in no apparent distress Resp:  CTA without rales, rhonchi, or wheezes diminished bs Cardio:  RRR with Gr I/VI systolic murmur, no rub GI:  + BS, soft and nontender mod distension Extremities:  No edema tender L hip Access:  AVF @ LUA with BFR 400  Dialysis: [facility]  4hrs F180 400/800 75kg 2K/2.25 bath Prof 4 LFA AVF Heparin 3000  Hectorol 1 Epo 6800  Last labs: Hb 11.0 tsat 15% ferritin 719 calc 9.4 phos 4.8 pth 296  Assessment/Plan: 1. Fall - sec to syncope, CT head negative, no further occurrence. 2. Low back pain - sec to fall, imaging negative for fx. 3. ESRD - HD on MWF @ [facility], K 5.2 today, missed HD yesterday (per holiday schedule).  HD tomorrow to maintain schedule. 4. Hypotension/Volume - BP 131/97 on Midodrine pre-HD; CXR negative, wt 78.9  kg with UF goal of 3 L. 5. Anemia - Hgb 10.2 on Aransp 60 mcg today. 6. Sec HPT - Ca 8.5, Hectorol 1 mcg, Renvela 1 with meals. 7. Nutrition - renal diet & vitamin. 8. DM Type 2 - insulin per primary. 9. CAD - Hx CABG. 10. Hx A-fib - on Amiodarone, no anticoagulation.    LOS: 2 days   LYLES,CHARLES 08/08/2013,7:29 AM I have seen and examined this patient and agree with the plan of care seen, examined, and eval. .  Maurita Havener,Jacaden L 08/08/2013, 8:46 AM

## 2013-09-09 ENCOUNTER — Encounter (HOSPITAL_COMMUNITY): Payer: Self-pay | Admitting: Emergency Medicine

## 2013-09-09 ENCOUNTER — Emergency Department (HOSPITAL_COMMUNITY): Payer: Medicare Other

## 2013-09-09 ENCOUNTER — Inpatient Hospital Stay (HOSPITAL_COMMUNITY)
Admission: EM | Admit: 2013-09-09 | Discharge: 2013-09-20 | DRG: 551 | Disposition: A | Discharge status: Skilled Nursing Facility | Payer: Medicare Other | Attending: Neurosurgery | Admitting: Neurosurgery

## 2013-09-09 ENCOUNTER — Other Ambulatory Visit: Payer: Self-pay

## 2013-09-09 ENCOUNTER — Observation Stay (HOSPITAL_COMMUNITY): Payer: Medicare Other

## 2013-09-09 DIAGNOSIS — M459 Ankylosing spondylitis of unspecified sites in spine: Secondary | ICD-10-CM | POA: Diagnosis present

## 2013-09-09 DIAGNOSIS — Z951 Presence of aortocoronary bypass graft: Secondary | ICD-10-CM

## 2013-09-09 DIAGNOSIS — M810 Age-related osteoporosis without current pathological fracture: Secondary | ICD-10-CM | POA: Diagnosis present

## 2013-09-09 DIAGNOSIS — K5900 Constipation, unspecified: Secondary | ICD-10-CM | POA: Diagnosis not present

## 2013-09-09 DIAGNOSIS — I2510 Atherosclerotic heart disease of native coronary artery without angina pectoris: Secondary | ICD-10-CM | POA: Diagnosis present

## 2013-09-09 DIAGNOSIS — S32019A Unspecified fracture of first lumbar vertebra, initial encounter for closed fracture: Secondary | ICD-10-CM | POA: Diagnosis present

## 2013-09-09 DIAGNOSIS — M109 Gout, unspecified: Secondary | ICD-10-CM | POA: Diagnosis present

## 2013-09-09 DIAGNOSIS — A0472 Enterocolitis due to Clostridium difficile, not specified as recurrent: Secondary | ICD-10-CM

## 2013-09-09 DIAGNOSIS — I120 Hypertensive chronic kidney disease with stage 5 chronic kidney disease or end stage renal disease: Secondary | ICD-10-CM | POA: Diagnosis present

## 2013-09-09 DIAGNOSIS — W19XXXA Unspecified fall, initial encounter: Secondary | ICD-10-CM | POA: Diagnosis present

## 2013-09-09 DIAGNOSIS — D649 Anemia, unspecified: Secondary | ICD-10-CM | POA: Diagnosis present

## 2013-09-09 DIAGNOSIS — I4891 Unspecified atrial fibrillation: Secondary | ICD-10-CM | POA: Diagnosis present

## 2013-09-09 DIAGNOSIS — E119 Type 2 diabetes mellitus without complications: Secondary | ICD-10-CM

## 2013-09-09 DIAGNOSIS — E1149 Type 2 diabetes mellitus with other diabetic neurological complication: Secondary | ICD-10-CM | POA: Diagnosis present

## 2013-09-09 DIAGNOSIS — IMO0002 Reserved for concepts with insufficient information to code with codable children: Secondary | ICD-10-CM

## 2013-09-09 DIAGNOSIS — I5020 Unspecified systolic (congestive) heart failure: Secondary | ICD-10-CM | POA: Diagnosis present

## 2013-09-09 DIAGNOSIS — J449 Chronic obstructive pulmonary disease, unspecified: Secondary | ICD-10-CM | POA: Diagnosis present

## 2013-09-09 DIAGNOSIS — Z794 Long term (current) use of insulin: Secondary | ICD-10-CM

## 2013-09-09 DIAGNOSIS — I959 Hypotension, unspecified: Secondary | ICD-10-CM | POA: Diagnosis present

## 2013-09-09 DIAGNOSIS — E1129 Type 2 diabetes mellitus with other diabetic kidney complication: Secondary | ICD-10-CM | POA: Diagnosis present

## 2013-09-09 DIAGNOSIS — K219 Gastro-esophageal reflux disease without esophagitis: Secondary | ICD-10-CM | POA: Diagnosis present

## 2013-09-09 DIAGNOSIS — N2581 Secondary hyperparathyroidism of renal origin: Secondary | ICD-10-CM | POA: Diagnosis present

## 2013-09-09 DIAGNOSIS — Z79899 Other long term (current) drug therapy: Secondary | ICD-10-CM

## 2013-09-09 DIAGNOSIS — E1142 Type 2 diabetes mellitus with diabetic polyneuropathy: Secondary | ICD-10-CM | POA: Diagnosis present

## 2013-09-09 DIAGNOSIS — Z87891 Personal history of nicotine dependence: Secondary | ICD-10-CM

## 2013-09-09 DIAGNOSIS — E43 Unspecified severe protein-calorie malnutrition: Secondary | ICD-10-CM | POA: Diagnosis present

## 2013-09-09 DIAGNOSIS — I5022 Chronic systolic (congestive) heart failure: Secondary | ICD-10-CM | POA: Diagnosis present

## 2013-09-09 DIAGNOSIS — Z992 Dependence on renal dialysis: Secondary | ICD-10-CM

## 2013-09-09 DIAGNOSIS — Z7982 Long term (current) use of aspirin: Secondary | ICD-10-CM

## 2013-09-09 DIAGNOSIS — J4489 Other specified chronic obstructive pulmonary disease: Secondary | ICD-10-CM | POA: Diagnosis present

## 2013-09-09 DIAGNOSIS — N186 End stage renal disease: Secondary | ICD-10-CM | POA: Diagnosis present

## 2013-09-09 DIAGNOSIS — D72829 Elevated white blood cell count, unspecified: Secondary | ICD-10-CM

## 2013-09-09 DIAGNOSIS — S32009A Unspecified fracture of unspecified lumbar vertebra, initial encounter for closed fracture: Principal | ICD-10-CM | POA: Diagnosis present

## 2013-09-09 DIAGNOSIS — I252 Old myocardial infarction: Secondary | ICD-10-CM

## 2013-09-09 LAB — CBC WITH DIFFERENTIAL/PLATELET
Basophils Absolute: 0 10*3/uL (ref 0.0–0.1)
Basophils Relative: 0 % (ref 0–1)
Eosinophils Absolute: 0.1 10*3/uL (ref 0.0–0.7)
Eosinophils Relative: 1 % (ref 0–5)
HEMATOCRIT: 31.9 % — AB (ref 39.0–52.0)
Hemoglobin: 10 g/dL — ABNORMAL LOW (ref 13.0–17.0)
Lymphocytes Relative: 4 % — ABNORMAL LOW (ref 12–46)
Lymphs Abs: 0.7 10*3/uL (ref 0.7–4.0)
MCH: 29.5 pg (ref 26.0–34.0)
MCHC: 31.3 g/dL (ref 30.0–36.0)
MCV: 94.1 fL (ref 78.0–100.0)
MONO ABS: 1.2 10*3/uL — AB (ref 0.1–1.0)
Monocytes Relative: 7 % (ref 3–12)
NEUTROS ABS: 15 10*3/uL — AB (ref 1.7–7.7)
Neutrophils Relative %: 88 % — ABNORMAL HIGH (ref 43–77)
Platelets: 303 10*3/uL (ref 150–400)
RBC: 3.39 MIL/uL — ABNORMAL LOW (ref 4.22–5.81)
RDW: 16.4 % — AB (ref 11.5–15.5)
WBC: 17 10*3/uL — AB (ref 4.0–10.5)

## 2013-09-09 LAB — BASIC METABOLIC PANEL
BUN: 41 mg/dL — ABNORMAL HIGH (ref 6–23)
CHLORIDE: 94 meq/L — AB (ref 96–112)
CO2: 29 meq/L (ref 19–32)
Calcium: 8.4 mg/dL (ref 8.4–10.5)
Creatinine, Ser: 4.45 mg/dL — ABNORMAL HIGH (ref 0.50–1.35)
GFR calc Af Amer: 14 mL/min — ABNORMAL LOW (ref >90)
GFR calc non Af Amer: 12 mL/min — ABNORMAL LOW (ref >90)
Glucose, Bld: 91 mg/dL (ref 70–99)
Potassium: 4.4 mEq/L (ref 3.7–5.3)
SODIUM: 138 meq/L (ref 137–147)

## 2013-09-09 LAB — TROPONIN I: Troponin I: <0.3 ng/mL (ref <0.30)

## 2013-09-09 MED ORDER — TRAMADOL-ACETAMINOPHEN 37.5-325 MG PO TABS
1.0000 | ORAL_TABLET | Freq: Three times a day (TID) | ORAL | Status: DC | PRN
  Administered 2013-09-09 – 2013-09-20 (×2): 1 via ORAL
  Filled 2013-09-09 (×2): qty 1

## 2013-09-09 MED ORDER — RENA-VITE PO TABS: 1.0000 | ORAL_TABLET | Freq: Every day | ORAL | Status: DC

## 2013-09-09 MED ORDER — SEVELAMER CARBONATE 800 MG PO TABS: 800.0000 mg | ORAL_TABLET | Freq: Three times a day (TID) | ORAL | Status: DC

## 2013-09-09 MED ORDER — OXYCODONE-ACETAMINOPHEN 5-325 MG PO TABS
1.0000 | ORAL_TABLET | Freq: Four times a day (QID) | ORAL | Status: DC | PRN
  Administered 2013-09-09: 1 via ORAL
  Filled 2013-09-09: qty 2

## 2013-09-09 MED ORDER — LIDOCAINE 5 % EX PTCH
1.0000 | MEDICATED_PATCH | CUTANEOUS | Status: DC
  Administered 2013-09-10 – 2013-09-19 (×11): 1 via TRANSDERMAL
  Filled 2013-09-09 (×12): qty 1

## 2013-09-09 MED ORDER — RENA-VITE PO TABS
1.0000 | ORAL_TABLET | Freq: Every day | ORAL | Status: DC
  Administered 2013-09-10 – 2013-09-19 (×11): 1 via ORAL
  Filled 2013-09-09 (×13): qty 1

## 2013-09-09 MED ORDER — DARBEPOETIN ALFA-POLYSORBATE 60 MCG/0.3ML IJ SOLN
60.0000 ug | INTRAMUSCULAR | Status: DC
  Administered 2013-09-12: 60 ug via INTRAVENOUS
  Filled 2013-09-09 (×2): qty 0.3

## 2013-09-09 MED ORDER — BISACODYL 10 MG RE SUPP: 10.0000 mg | Freq: Every day | RECTAL | Status: DC | PRN

## 2013-09-09 MED ORDER — SODIUM CHLORIDE 0.9 % IV SOLN
INTRAVENOUS | Status: DC
  Administered 2013-09-10: 01:00:00 via INTRAVENOUS

## 2013-09-09 MED ORDER — WHITE PETROLATUM GEL
Status: AC
  Filled 2013-09-09: qty 5

## 2013-09-09 MED ORDER — NA FERRIC GLUC CPLX IN SUCROSE 12.5 MG/ML IV SOLN
125.0000 mg | INTRAVENOUS | Status: DC | PRN
  Administered 2013-09-10: 125 mg via INTRAVENOUS
  Filled 2013-09-09: qty 10

## 2013-09-09 MED ORDER — MAGNESIUM HYDROXIDE 400 MG/5ML PO SUSP: 5.0000 mL | Freq: Two times a day (BID) | ORAL | Status: DC | PRN

## 2013-09-09 MED ORDER — METRONIDAZOLE IN NACL 5-0.79 MG/ML-% IV SOLN
500.0000 mg | Freq: Three times a day (TID) | INTRAVENOUS | Status: DC
  Administered 2013-09-09 – 2013-09-10 (×3): 500 mg via INTRAVENOUS
  Filled 2013-09-09 (×4): qty 100

## 2013-09-09 MED ORDER — SENNA 8.6 MG PO TABS
1.0000 | ORAL_TABLET | Freq: Two times a day (BID) | ORAL | Status: DC
  Filled 2013-09-09 (×4): qty 1

## 2013-09-09 MED ORDER — PANTOPRAZOLE SODIUM 40 MG PO TBEC
40.0000 mg | DELAYED_RELEASE_TABLET | Freq: Every day | ORAL | Status: DC
  Administered 2013-09-10 – 2013-09-11 (×2): 40 mg via ORAL
  Filled 2013-09-09 (×2): qty 1

## 2013-09-09 MED ORDER — DOXERCALCIFEROL 4 MCG/2ML IV SOLN
2.0000 ug | Freq: Once | INTRAVENOUS | Status: AC
  Administered 2013-09-10: 2 ug via INTRAVENOUS
  Filled 2013-09-09: qty 2

## 2013-09-09 MED ORDER — GABAPENTIN 300 MG PO CAPS
300.0000 mg | ORAL_CAPSULE | Freq: Two times a day (BID) | ORAL | Status: DC
  Administered 2013-09-10 – 2013-09-20 (×21): 300 mg via ORAL
  Filled 2013-09-09 (×23): qty 1

## 2013-09-09 MED ORDER — POLYETHYLENE GLYCOL 3350 17 G PO PACK
17.0000 g | PACK | Freq: Every day | ORAL | Status: DC
  Filled 2013-09-09 (×2): qty 1

## 2013-09-09 MED ORDER — AMIODARONE HCL 200 MG PO TABS
200.0000 mg | ORAL_TABLET | Freq: Every day | ORAL | Status: DC
  Administered 2013-09-10 – 2013-09-20 (×10): 200 mg via ORAL
  Filled 2013-09-09 (×11): qty 1

## 2013-09-09 MED ORDER — ALLOPURINOL 100 MG PO TABS
100.0000 mg | ORAL_TABLET | Freq: Two times a day (BID) | ORAL | Status: DC
  Administered 2013-09-10 – 2013-09-20 (×21): 100 mg via ORAL
  Filled 2013-09-09 (×23): qty 1

## 2013-09-09 MED ORDER — ZOLPIDEM TARTRATE 5 MG PO TABS
5.0000 mg | ORAL_TABLET | Freq: Every day | ORAL | Status: DC
  Administered 2013-09-10 – 2013-09-19 (×10): 5 mg via ORAL
  Filled 2013-09-09 (×10): qty 1

## 2013-09-09 MED ORDER — INSULIN ASPART 100 UNIT/ML ~~LOC~~ SOLN
0.0000 [IU] | Freq: Three times a day (TID) | SUBCUTANEOUS | Status: DC
  Administered 2013-09-13: 2 [IU] via SUBCUTANEOUS
  Administered 2013-09-14: 1 [IU] via SUBCUTANEOUS
  Administered 2013-09-15: 3 [IU] via SUBCUTANEOUS
  Administered 2013-09-15: 1 [IU] via SUBCUTANEOUS
  Administered 2013-09-15: 2 [IU] via SUBCUTANEOUS
  Administered 2013-09-16: 1 [IU] via SUBCUTANEOUS
  Administered 2013-09-16: 2 [IU] via SUBCUTANEOUS
  Administered 2013-09-17 – 2013-09-20 (×4): 1 [IU] via SUBCUTANEOUS

## 2013-09-09 MED ORDER — MIDODRINE HCL 5 MG PO TABS
5.0000 mg | ORAL_TABLET | ORAL | Status: DC
  Administered 2013-09-16: 5 mg via ORAL
  Filled 2013-09-09 (×6): qty 1

## 2013-09-09 MED ORDER — HEPARIN SODIUM (PORCINE) 5000 UNIT/ML IJ SOLN
5000.0000 [IU] | Freq: Three times a day (TID) | INTRAMUSCULAR | Status: DC
Start: 2013-09-09 — End: 2013-09-20
  Administered 2013-09-10 – 2013-09-20 (×29): 5000 [IU] via SUBCUTANEOUS
  Filled 2013-09-09 (×35): qty 1

## 2013-09-09 MED ORDER — SEVELAMER CARBONATE 800 MG PO TABS
800.0000 mg | ORAL_TABLET | Freq: Three times a day (TID) | ORAL | Status: DC
  Administered 2013-09-10 – 2013-09-18 (×21): 800 mg via ORAL
  Filled 2013-09-09 (×29): qty 1

## 2013-09-09 MED ORDER — GERHARDT'S BUTT CREAM
TOPICAL_CREAM | Freq: Three times a day (TID) | CUTANEOUS | Status: DC
  Administered 2013-09-09 – 2013-09-13 (×10): via TOPICAL
  Administered 2013-09-14 – 2013-09-15 (×5): 1 via TOPICAL
  Administered 2013-09-16 – 2013-09-17 (×2): via TOPICAL
  Administered 2013-09-18: 1 via TOPICAL
  Administered 2013-09-18 – 2013-09-20 (×4): via TOPICAL
  Filled 2013-09-09: qty 1

## 2013-09-09 MED ORDER — OXYCODONE HCL 5 MG PO TABS
5.0000 mg | ORAL_TABLET | ORAL | Status: DC | PRN
  Administered 2013-09-10 (×3): 10 mg via ORAL
  Filled 2013-09-09 (×3): qty 2

## 2013-09-09 MED ORDER — MULTI-VITAMIN/MINERALS PO TABS
1.0000 | ORAL_TABLET | Freq: Every day | ORAL | Status: DC
  Administered 2013-09-12 – 2013-09-20 (×6): 1 via ORAL
  Filled 2013-09-09 (×11): qty 1

## 2013-09-09 MED ORDER — MORPHINE SULFATE 2 MG/ML IJ SOLN
1.0000 mg | INTRAMUSCULAR | Status: DC | PRN
  Administered 2013-09-09 – 2013-09-14 (×3): 1 mg via INTRAVENOUS
  Filled 2013-09-09 (×3): qty 1

## 2013-09-09 MED ORDER — MIDODRINE HCL 5 MG PO TABS
5.0000 mg | ORAL_TABLET | ORAL | Status: DC | PRN
  Administered 2013-09-12: 5 mg via ORAL
  Filled 2013-09-09: qty 1

## 2013-09-09 MED ORDER — IPRATROPIUM BROMIDE 0.06 % NA SOLN
2.0000 | Freq: Three times a day (TID) | NASAL | Status: DC
  Administered 2013-09-10 – 2013-09-20 (×19): 2 via NASAL
  Filled 2013-09-09: qty 15

## 2013-09-09 MED ORDER — ACETAMINOPHEN 500 MG PO TABS
500.0000 mg | ORAL_TABLET | ORAL | Status: DC | PRN
  Filled 2013-09-09: qty 1

## 2013-09-09 MED ORDER — DOCUSATE SODIUM 100 MG PO CAPS
100.0000 mg | ORAL_CAPSULE | Freq: Two times a day (BID) | ORAL | Status: DC
  Administered 2013-09-10: 100 mg via ORAL
  Filled 2013-09-09: qty 1

## 2013-09-09 MED ORDER — DOXERCALCIFEROL 4 MCG/2ML IV SOLN
2.0000 ug | INTRAVENOUS | Status: DC
  Administered 2013-09-12 – 2013-09-16 (×3): 2 ug via INTRAVENOUS
  Filled 2013-09-09 (×4): qty 2

## 2013-09-09 NOTE — Consult Note (Signed)
[facility] Medical Consultation  Nicholas [name] [id number] DOB: September 10, 1940 DOA: 09/09/2013 PCP: [facility]   Requesting physician: Dr Eulis Foster Date of consultation: 09-09-2013 Reason for consultation: medical clearance and help with medical management.   Impression/Recommendations Active Problems:   ESRD (end stage renal disease) on dialysis   DM (diabetes mellitus)   CAD (coronary artery disease)   S/P CABG x 5   Systolic CHF   Leukocytosis   L1 vertebral fracture    1-Back pain: Out side facility MRI show possible unstable L 1 fracture. He had Lumbar CT scan that show Acute nondisplaced transverse fracture through the vertebral body and posterior elements of L1 (Chance fracture), with associated paraspinal edema. neurosurgery considering surgical intervention. Patient at high risk for procedure due to multiple co-morbidities. Patient accept the risk. CT show also Severe wall thickening in the sigmoid colon with surrounding stranding, potentially from severe colitis or tumor. Might need GI evaluation.  Check chest x ray.  2-ESRD; renal consulted.  3-Severe wall thickening in the sigmoid colon with surrounding stranding, potentially from severe colitis or tumor by CT scan; will start Flagyl. He denies abdominal pain. Has had 2 BM today. unknown consistency. Check for C diff.  4-A fib; rate controlled. In sinus rhythm. Continue with amiodarone.  5-Heart Failure; Appears to be compensated.  6-Diabetes; hold 70/30 in case surgery and NPO status. Will start SSI.  7-Leukocytosis; treat for colitis. Check chest x ray.   I will followup again tomorrow. Please contact me if I can be of assistance in the meanwhile. Thank you for this consultation.  Chief Complaint: Back pain.   HPI: 73 year old with PMH significant for CAD s/p CABG x 5 01-2013, Diabetes, ESRD on dialysis, COPD, Hypertension, who presents to ED complaining of back pain that started one week prior to Christmas. This  appears to be a mechanical fall. He has not been able to ambulate since after the fall due to pain and inability to stand on his feet.  He denies  chest pain, SOB, abdominal pain, nausea, vomiting, cough. He denies chest pain or dyspnea on exertion prior to the fall.     Review of Systems:  Negative except as per HPI.   Past Medical History  Diagnosis Date  . Coronary artery disease     a. hx of MI 1992; b. 10/2012 NSTEMI (no cath 2/2 GIB);  c. 11/2012 MV: Inf defect w inf and septal HK (favor prior MI), EF 47%;  d. 12/2012 Cath: Severe 3vd, EF 45-50%;  e. 01/2013 CABG x 5 (LIMA->LAD, VG->D1, VG->OM1->OM2, VG->PDA)  . Diabetes mellitus   . Hypertension   . Arthritis   . Carotid artery disease     a. 01/2013 doppler: >80% RICA stenosis, [id number] LICA stenosis;  b. [id number] R CEA.  . Gastritis 2012 and 09/2011    treated for H Pylori in 12/2011  . Diabetic neuropathy   . Osteoporosis   . COPD (chronic obstructive pulmonary disease)   . Anxiety   . Depression   . ESRD (end stage renal disease)     M-W-Sa  [name]  . Diabetic nephropathy   . GERD (gastroesophageal reflux disease)   . Neuromuscular disorder     peripheral neurotherapy  . Anemia   . A-fib     a. Dx 10/2012, on amio, no anticoagulation 2/2 GIB 10/2012.  [name] GIB (gastrointestinal bleeding)     a. 10/2012, awaiting colonoscopy.   Past Surgical History  Procedure Laterality Date  .  Coronary stent placement    . Hip surgery      both hips  . Av fistula placement      Left arm  . Cardiac catheterization    . Joint replacement Bilateral   . Eye surgery Bilateral     cararacts  . Coronary artery bypass graft N/A 02/03/2013    Procedure: CORONARY ARTERY BYPASS GRAFTING (CABG);  Surgeon: Melrose Nakayama, MD;  Location: [place];  Service: Open Heart Surgery;  Laterality: N/A;  . Endarterectomy N/A 02/03/2013    Procedure: ENDARTERECTOMY CAROTID;  Surgeon: Mal Misty, MD;  Location: [place];  Service: Vascular;  Laterality:  N/A;  . Olecranon bursectomy Right 07/28/2013    Procedure: RIGHT ELBOW OLECRANON BURSECTOMY ADVANCEMENT CLOSURE ;  Surgeon: Linna Hoff, MD;  Location: [place];  Service: Orthopedics;  Laterality: Right;   Social History:  reports that he quit smoking about 23 years ago. His smoking use included Cigarettes. He started smoking about 48 years ago. He has a 25 pack-year smoking history. He has never used smokeless tobacco. He reports that he drinks alcohol. He reports that he does not use illicit drugs.  Allergies  Allergen Reactions  . Statins Other (See Comments)    Muscle weakness    Family History  Problem Relation Age of Onset  . Heart attack Father 50  . Heart disease Father   . Stroke Mother   . Hypertension Mother   . Other Mother     varicose veins  . Stroke Maternal Grandmother   . Hypertension Brother     Prior to Admission medications   Medication Sig Start Date End Date Taking? Authorizing Provider  acetaminophen (TYLENOL) 500 MG tablet Take 500 mg by mouth every 4 (four) hours as needed (pain).    Yes Historical Provider, MD  allopurinol (ZYLOPRIM) 100 MG tablet Take 100 mg by mouth 2 (two) times daily.    Yes Historical Provider, MD  amiodarone (PACERONE) 200 MG tablet Take 200 mg by mouth daily.   Yes Historical Provider, MD  aspirin 325 MG tablet Take 325 mg by mouth daily.   Yes Historical Provider, MD  bisacodyl (DULCOLAX) 10 MG suppository Place 10 mg rectally daily as needed for mild constipation or moderate constipation.   Yes Historical Provider, MD  docusate sodium (COLACE) 100 MG capsule Take 100 mg by mouth 2 (two) times daily.   Yes Historical Provider, MD  fish oil-omega-3 fatty acids 1000 MG capsule Take 1 g by mouth 2 (two) times daily.    Yes Historical Provider, MD  gabapentin (NEURONTIN) 300 MG capsule Take 1 capsule (300 mg total) by mouth 2 (two) times daily. 02/15/13  Yes Donielle Liston Alba, PA-C  insulin aspart protamine- aspart (NOVOLOG MIX 70/30)  (70-30) 100 UNIT/ML injection Inject 2 Units into the skin 3 (three) times daily with meals.   Yes Historical Provider, MD  ipratropium (ATROVENT) 0.06 % nasal spray Place 2 sprays into both nostrils 3 (three) times daily.    Yes Historical Provider, MD  lidocaine (LIDODERM) 5 % Place 1 patch onto the skin daily. Remove & Discard patch within 12 hours or as directed by MD   Yes Historical Provider, MD  magnesium hydroxide (MILK OF MAGNESIA) 400 MG/5ML suspension Take by mouth every 12 (twelve) hours as needed for mild constipation.   Yes Historical Provider, MD  midodrine (PROAMATINE) 5 MG tablet Take 5 mg by mouth 3 (three) times a week. Take once daily in the morning  on dialysis days (usually on Monday, Wednesday and Friday)   Yes Historical Provider, MD  Multiple Vitamins-Minerals (MULTIVITAMIN WITH MINERALS) tablet Take 1 tablet by mouth daily.    Yes Historical Provider, MD  multivitamin (RENA-VIT) TABS tablet Take 1 tablet by mouth at bedtime.    Yes Historical Provider, MD  oxyCODONE (OXY IR/ROXICODONE) 5 MG immediate release tablet Take 5-10 mg by mouth every 4 (four) hours as needed for moderate pain or severe pain.   Yes Historical Provider, MD  pantoprazole (PROTONIX) 40 MG tablet Take 40 mg by mouth daily.   Yes Historical Provider, MD  polyethylene glycol (MIRALAX / GLYCOLAX) packet Take 17 g by mouth daily.   Yes Historical Provider, MD  senna (SENOKOT) 8.6 MG TABS tablet Take 1 tablet by mouth 2 (two) times daily.   Yes Historical Provider, MD  sevelamer carbonate (RENVELA) 800 MG tablet Take 800 mg by mouth 3 (three) times daily with meals.   Yes Historical Provider, MD  traMADol-acetaminophen (ULTRACET) 37.5-325 MG per tablet Take 1 tablet by mouth 3 (three) times daily as needed (pain).  08/01/13  Yes Historical Provider, MD  Witch Hazel (PREPARATION H EX) Apply 1 application topically every 6 (six) hours as needed (hemorrhoids).   Yes Historical Provider, MD  zolpidem (AMBIEN) 10 MG  tablet Take 10 mg by mouth at bedtime.   Yes Historical Provider, MD   Physical Exam: Blood pressure 113/62, pulse 73, temperature 98.6 F (37 C), temperature source Oral, resp. rate 20, SpO2 97.00%. Filed Vitals:   09/09/13 1554  BP: 113/62  Pulse: 73  Temp:   Resp: 20   General:  Appears calm and comfortable Eyes: PERRL, normal lids, irises & conjunctiva ENT: grossly normal hearing, lips & tongue Neck: no LAD, masses or thyromegaly Cardiovascular: RRR, no m/r/g. No LE edema. Telemetry: SR, no arrhythmias  Respiratory: CTA bilaterally, no w/r/r. Normal respiratory effort. Abdomen: soft, ntnd Skin: no rash or induration seen on limited exam Musculoskeletal: grossly normal tone BUE/BLE Psychiatric: grossly normal mood and affect, speech fluent and appropriate Neurologic: grossly non-focal.    Labs on Admission:  Basic Metabolic Panel:  Recent Labs Lab 09/09/13 1328  NA 138  K 4.4  CL 94*  CO2 29  GLUCOSE 91  BUN 41*  CREATININE 4.45*  CALCIUM 8.4   Liver Function Tests: No results found for this basename: AST, ALT, ALKPHOS, BILITOT, PROT, ALBUMIN,  in the last 168 hours No results found for this basename: LIPASE, AMYLASE,  in the last 168 hours No results found for this basename: AMMONIA,  in the last 168 hours CBC:  Recent Labs Lab 09/09/13 1328  WBC 17.0*  NEUTROABS 15.0*  HGB 10.0*  HCT 31.9*  MCV 94.1  PLT 303   Cardiac Enzymes: No results found for this basename: CKTOTAL, CKMB, CKMBINDEX, TROPONINI,  in the last 168 hours BNP: No components found with this basename: POCBNP,  CBG: No results found for this basename: GLUCAP,  in the last 168 hours  Radiological Exams on Admission: Ct Lumbar Spine Wo Contrast  09/09/2013   CLINICAL DATA:  Back pain.  Fall 6 weeks ago.  L1 fracture.  EXAM: CT LUMBAR SPINE WITHOUT CONTRAST  TECHNIQUE: Multidetector CT imaging of the lumbar spine was performed without intravenous contrast administration. Multiplanar CT  image reconstructions were also generated.  COMPARISON:  09/08/2013  FINDINGS: The spinous processes, facets, an vertebral bodies are all fused in the lumbar spine suggesting severe and advanced ankylosing spondylitis.  There is  a transverse cleft in the L1 vertebral body extending longitudinally into the pedicles. This cleft has fluid signal and tiny locules of gas signal, and the fracture appears to continue through the posterior elements in a nondisplaced manner. No subluxation. Irregular margins of the fracture somewhat intact interdigitate.  The T11-12 level does not appear completely fused although there is fusion at T11.  Severe bony demineralization.  The sacroiliac joints are completely fused.  Scattered exophytic lesions of both kidneys have varying complexity.  There is paraspinal hematoma in the vicinity of the fracture. I do not demonstrate significant bony retropulsion.  Aortoiliac atherosclerotic vascular disease is present.  There is considerable abnormal wall thickening and 3 long segment of the sigmoid colon, potentially from prominent inflammation or less likely tumor. Adjacent mesenteric edema noted.  Mild levoconvex lumbar scoliosis. Scattered small retroperitoneal lymph nodes are present.  IMPRESSION: 1. Acute nondisplaced transverse fracture through the vertebral body and posterior elements of L1 (Chance fracture), with associated paraspinal edema. 2. Markedly severe manifestations of ankylosing spondylitis with complete anterior and posterior fusion in the lumbar spine and complete fusion of the sacroiliac joints. 3. Severe wall thickening in the sigmoid colon with surrounding stranding, potentially from severe colitis or tumor. 4. Bilateral complex renal exophytic lesions, probably complex cysts although these are not characterized on today's exam with regard to malignant potential.   Electronically Signed   By: Sherryl Barters M.D.   On: 09/09/2013 17:44   [employer]  Spine  09/09/2013   This examination belongs to an outside facility and is stored  here for comparison purposes only.  Contact the originating outside  institution for any associated report or interpretation.   EKG: Independently reviewed. Sinus Rhythm, No ST elevation.   Time spent: 75 minutes.   Belky Mundo [facility] Pager [phone]  If 7PM-7AM, please contact night-coverage [email or web address] Password [facility] 09/09/2013, 5:54 PM

## 2013-09-09 NOTE — ED Provider Notes (Signed)
CSN: [id number]     Arrival date & time 09/09/13  1212 History   First MD Initiated Contact with Patient 09/09/13 1216     Chief Complaint  Patient presents with  . Back Pain  . Diarrhea   (Consider location/radiation/quality/duration/timing/severity/associated sxs/prior Treatment) Patient is a 73 y.o. male presenting with back pain and diarrhea. The history is provided by the patient.  Back Pain Diarrhea  He is here for evaluation of back pain and abnormal MRI. He was seen yesterday by his physician, had an MRI, and was sent here after the MRI returned showing an L1 fracture, unstable. He was due to go to dialysis today, but did not because he had an episode of diarrhea. He has diarrhea intermittently. There has been no rectal bleeding. He is taking Percocet without relief of his pain. He is in a rehabilitation facility, at this time. This was required because of a fall, injuring his back, one month ago.  Past Medical History  Diagnosis Date  . Coronary artery disease     a. hx of MI 1992; b. 10/2012 NSTEMI (no cath 2/2 GIB);  c. 11/2012 MV: Inf defect w inf and septal HK (favor prior MI), EF 47%;  d. 12/2012 Cath: Severe 3vd, EF 45-50%;  e. 01/2013 CABG x 5 (LIMA->LAD, VG->D1, VG->OM1->OM2, VG->PDA)  . Diabetes mellitus   . Hypertension   . Arthritis   . Carotid artery disease     a. 01/2013 doppler: >80% RICA stenosis, XX123456 LICA stenosis;  b. [id number] R CEA.  . Gastritis 2012 and 09/2011    treated for H Pylori in 12/2011  . Diabetic neuropathy   . Osteoporosis   . COPD (chronic obstructive pulmonary disease)   . Anxiety   . Depression   . ESRD (end stage renal disease)     M-W-Sa  [name]  . Diabetic nephropathy   . GERD (gastroesophageal reflux disease)   . Neuromuscular disorder     peripheral neurotherapy  . Anemia   . A-fib     a. Dx 10/2012, on amio, no anticoagulation 2/2 GIB 10/2012.  [name] GIB (gastrointestinal bleeding)     a. 10/2012, awaiting colonoscopy.   Past  Surgical History  Procedure Laterality Date  . Coronary stent placement    . Hip surgery      both hips  . Av fistula placement      Left arm  . Cardiac catheterization    . Joint replacement Bilateral   . Eye surgery Bilateral     cararacts  . Coronary artery bypass graft N/A 02/03/2013    Procedure: CORONARY ARTERY BYPASS GRAFTING (CABG);  Surgeon: Melrose Nakayama, MD;  Location: [facility];  Service: Open Heart Surgery;  Laterality: N/A;  . Endarterectomy N/A 02/03/2013    Procedure: ENDARTERECTOMY CAROTID;  Surgeon: Mal Misty, MD;  Location: [facility];  Service: Vascular;  Laterality: N/A;  . Olecranon bursectomy Right 07/28/2013    Procedure: RIGHT ELBOW OLECRANON BURSECTOMY ADVANCEMENT CLOSURE ;  Surgeon: Linna Hoff, MD;  Location: [facility];  Service: Orthopedics;  Laterality: Right;   Family History  Problem Relation Age of Onset  . Heart attack Father 40  . Heart disease Father   . Stroke Mother   . Hypertension Mother   . Other Mother     varicose veins  . Stroke Maternal Grandmother   . Hypertension Brother    History  Substance Use Topics  . Smoking status: Former Smoker -- 1.00 packs/day for  25 years    Types: Cigarettes    Start date: 08/18/1965    Quit date: 08/18/1990  . Smokeless tobacco: Never Used  . Alcohol Use: Yes     Comment: occasionally    Review of Systems  Gastrointestinal: Positive for diarrhea.  Musculoskeletal: Positive for back pain.  All other systems reviewed and are negative.    Allergies  Statins  Home Medications   Current Outpatient Rx  Name  Route  Sig  Dispense  Refill  . acetaminophen (TYLENOL) 500 MG tablet   Oral   Take 500 mg by mouth every 4 (four) hours as needed (pain).          [name] allopurinol (ZYLOPRIM) 100 MG tablet   Oral   Take 100 mg by mouth 2 (two) times daily.          [name] amiodarone (PACERONE) 200 MG tablet   Oral   Take 200 mg by mouth daily.         [name] aspirin 325 MG tablet   Oral   Take 325  mg by mouth daily.         . bisacodyl (DULCOLAX) 10 MG suppository   Rectal   Place 10 mg rectally daily as needed for mild constipation or moderate constipation.         . docusate sodium (COLACE) 100 MG capsule   Oral   Take 100 mg by mouth 2 (two) times daily.         . fish oil-omega-3 fatty acids 1000 MG capsule   Oral   Take 1 g by mouth 2 (two) times daily.          [name] gabapentin (NEURONTIN) 300 MG capsule   Oral   Take 1 capsule (300 mg total) by mouth 2 (two) times daily.         . insulin aspart protamine- aspart (NOVOLOG MIX 70/30) (70-30) 100 UNIT/ML injection   Subcutaneous   Inject 2 Units into the skin 3 (three) times daily with meals.         [name] ipratropium (ATROVENT) 0.06 % nasal spray   Each Nare   Place 2 sprays into both nostrils 3 (three) times daily.          [name] lidocaine (LIDODERM) 5 %   Transdermal   Place 1 patch onto the skin daily. Remove & Discard patch within 12 hours or as directed by MD         . magnesium hydroxide (MILK OF MAGNESIA) 400 MG/5ML suspension   Oral   Take by mouth every 12 (twelve) hours as needed for mild constipation.         . midodrine (PROAMATINE) 5 MG tablet   Oral   Take 5 mg by mouth 3 (three) times a week. Take once daily in the morning on dialysis days (usually on Monday, Wednesday and Friday)         . Multiple Vitamins-Minerals (MULTIVITAMIN WITH MINERALS) tablet   Oral   Take 1 tablet by mouth daily.          . multivitamin (RENA-VIT) TABS tablet   Oral   Take 1 tablet by mouth at bedtime.          [name] oxyCODONE (OXY IR/ROXICODONE) 5 MG immediate release tablet   Oral   Take 5-10 mg by mouth every 4 (four) hours as needed for moderate pain or severe pain.         . pantoprazole (PROTONIX) 40  MG tablet   Oral   Take 40 mg by mouth daily.         . polyethylene glycol (MIRALAX / GLYCOLAX) packet   Oral   Take 17 g by mouth daily.         [name] senna (SENOKOT) 8.6 MG TABS tablet    Oral   Take 1 tablet by mouth 2 (two) times daily.         . sevelamer carbonate (RENVELA) 800 MG tablet   Oral   Take 800 mg by mouth 3 (three) times daily with meals.         . traMADol-acetaminophen (ULTRACET) 37.5-325 MG per tablet   Oral   Take 1 tablet by mouth 3 (three) times daily as needed (pain).          Addison Lank Hazel (PREPARATION H EX)   Apply externally   Apply 1 application topically every 6 (six) hours as needed (hemorrhoids).         . zolpidem (AMBIEN) 10 MG tablet   Oral   Take 10 mg by mouth at bedtime.          BP 113/62  Pulse 73  Temp(Src) 98.6 F (37 C) (Oral)  Resp 20  SpO2 97% Physical Exam  Nursing note and vitals reviewed. Constitutional: He is oriented to person, place, and time. He appears well-developed.  Elderly, frail  HENT:  Head: Normocephalic and atraumatic.  Right Ear: External ear normal.  Left Ear: External ear normal.  Eyes: Conjunctivae and EOM are normal. Pupils are equal, round, and reactive to light.  Neck: Normal range of motion and phonation normal. Neck supple.  Cardiovascular: Normal rate, regular rhythm, normal heart sounds and intact distal pulses.   Left forearm AV fistula with normal thrill. No associated swelling or deformity  Pulmonary/Chest: Effort normal and breath sounds normal. He exhibits no bony tenderness.  Abdominal: Soft. Normal appearance. There is no tenderness.  Musculoskeletal: He exhibits no edema and no tenderness.  Mild left upper lumbar tenderness. Somewhat decreased range of motion of the back, secondary to pain with motion.  Neurological: He is alert and oriented to person, place, and time. No cranial nerve deficit or sensory deficit. He exhibits normal muscle tone. Coordination normal.  Skin: Skin is warm, dry and intact.  Psychiatric: He has a normal mood and affect. His behavior is normal. Judgment and thought content normal.    ED Course  Procedures (including critical care  time) Medications  Gerhardt's butt cream ( Topical Given 09/09/13 1412)  oxyCODONE-acetaminophen (PERCOCET/ROXICET) 5-325 MG per tablet 1-2 tablet (1 tablet Oral Given 09/09/13 1745)  morphine 2 MG/ML injection 1 mg (not administered)    Patient Vitals for the past 24 hrs:  BP Temp Temp src Pulse Resp SpO2  09/09/13 1554 113/62 mmHg - - 73 20 97 %  09/09/13 1530 119/65 mmHg - - 75 - 96 %  09/09/13 1500 158/74 mmHg - - 83 20 99 %  09/09/13 1345 105/62 mmHg - - 73 20 95 %  09/09/13 1224 117/61 mmHg 98.6 F (37 C) Oral 72 18 95 %    5:13 PM Reevaluation with update and discussion. After initial assessment and treatment, an updated evaluation reveals He is unchanged. Coben Godshall L   14:20- discussed with neurosurgery, Dr. Kathyrn Sheriff, he saw the patient and will admit him for probable surgery. Wants to do a CT prior to deciding on the treatment course.  16:48- discussed with nephrology, Dr. Arty Baumgartner, he will  be involved as a Optometrist for hemodialysis.  17:00- discussed with hospitalist, Dr. Tyrell Antonio, she will evaluate the patient for medical clearance for surgery.   Labs Review Labs Reviewed  CBC WITH DIFFERENTIAL - Abnormal; Notable for the following:    WBC 17.0 (*)    RBC 3.39 (*)    Hemoglobin 10.0 (*)    HCT 31.9 (*)    RDW 16.4 (*)    Neutrophils Relative % 88 (*)    Neutro Abs 15.0 (*)    Lymphocytes Relative 4 (*)    Monocytes Absolute 1.2 (*)    All other components within normal limits  BASIC METABOLIC PANEL - Abnormal; Notable for the following:    Chloride 94 (*)    BUN 41 (*)    Creatinine, Ser 4.45 (*)    GFR calc non Af Amer 12 (*)    GFR calc Af Amer 14 (*)    All other components within normal limits  TROPONIN I   Imaging Review Ct Lumbar Spine Wo Contrast  09/09/2013   CLINICAL DATA:  Back pain.  Fall 6 weeks ago.  L1 fracture.  EXAM: CT LUMBAR SPINE WITHOUT CONTRAST  TECHNIQUE: Multidetector CT imaging of the lumbar spine was performed without  intravenous contrast administration. Multiplanar CT image reconstructions were also generated.  COMPARISON:  09/08/2013  FINDINGS: The spinous processes, facets, an vertebral bodies are all fused in the lumbar spine suggesting severe and advanced ankylosing spondylitis.  There is a transverse cleft in the L1 vertebral body extending longitudinally into the pedicles. This cleft has fluid signal and tiny locules of gas signal, and the fracture appears to continue through the posterior elements in a nondisplaced manner. No subluxation. Irregular margins of the fracture somewhat intact interdigitate.  The T11-12 level does not appear completely fused although there is fusion at T11.  Severe bony demineralization.  The sacroiliac joints are completely fused.  Scattered exophytic lesions of both kidneys have varying complexity.  There is paraspinal hematoma in the vicinity of the fracture. I do not demonstrate significant bony retropulsion.  Aortoiliac atherosclerotic vascular disease is present.  There is considerable abnormal wall thickening and 3 long segment of the sigmoid colon, potentially from prominent inflammation or less likely tumor. Adjacent mesenteric edema noted.  Mild levoconvex lumbar scoliosis. Scattered small retroperitoneal lymph nodes are present.  IMPRESSION: 1. Acute nondisplaced transverse fracture through the vertebral body and posterior elements of L1 (Chance fracture), with associated paraspinal edema. 2. Markedly severe manifestations of ankylosing spondylitis with complete anterior and posterior fusion in the lumbar spine and complete fusion of the sacroiliac joints. 3. Severe wall thickening in the sigmoid colon with surrounding stranding, potentially from severe colitis or tumor. 4. Bilateral complex renal exophytic lesions, probably complex cysts although these are not characterized on today's exam with regard to malignant potential.   Electronically Signed   By: Sherryl Barters M.D.   On:  09/09/2013 17:44   Mr [facility] Spine  09/09/2013   This examination belongs to an outside facility and is stored  here for comparison purposes only.  Contact the originating outside  institution for any associated report or interpretation.   EKG Interpretation    Date/Time:  Friday September 09 2013 17:43:27 EST Ventricular Rate:  77 PR Interval:  172 QRS Duration: 99 QT Interval:  428 QTC Calculation: 484 R Axis:   45 Text Interpretation:  Sinus rhythm Borderline prolonged QT interval Since last tracing QT has lengthened Confirmed by Eulis Foster  MD,  Bogdan Vivona ([id number]) on 09/09/2013 5:51:11 PM            MDM   1. Fracture of L1 vertebra    Subacute fracture, L1, with instability. No evidence for lumbar myelography, on evaluation. Patient will need to be admitted for stabilization and possible surgery for repair. There is no indication for needing urgent hemodialysis at this time.    Nursing Notes Reviewed/ Care Coordinated, and agree without changes. Applicable Imaging Reviewed.  Interpretation of Laboratory Data incorporated into ED treatment  Plan: Admit    Richarda Blade, MD 09/09/13 2019

## 2013-09-09 NOTE — Consult Note (Signed)
[facility] Renal Consultation Note    Indication for Consultation:  Management of ESRD/hemodialysis; anemia, hypertension/volume and secondary hyperparathyroidism  HPI: Nicholas [name] is a pleasant 73 y.o. male ESRD patient, MWF HD) with multiple medical problems who presented to the ED via EMS with complaints of persistent back pain and decreased mobility after a fall about 1 month ago. (Admitted 12/20-12/22 for syncopal episode/ fell backwards.) Prior to his fall, he states that he was able to ambulate with a cane or walker but now he is unable to stand. Lying supine is about the only position he can tolerate.  An outpatient MRI performed on 09/07/13 demonstrated a subacute L1 fracture and fusion of the lumbar vertebral bodies concerning for Ankylosing Spondylitis.  Neurosurgery is considering the possibility of stabilization surgery once risks of proceeding are examined.  At the time of this encounter, he is resting in bed with complaints of back pain and diarrhea which started this morning. He missed his regular hemodialysis session today, however K+ is within normal limits and there are no signs of volume excess.  Past Medical History  Diagnosis Date  . Coronary artery disease     a. hx of MI 1992; b. 10/2012 NSTEMI (no cath 2/2 GIB);  c. 11/2012 MV: Inf defect w inf and septal HK (favor prior MI), EF 47%;  d. 12/2012 Cath: Severe 3vd, EF 45-50%;  e. 01/2013 CABG x 5 (LIMA->LAD, VG->D1, VG->OM1->OM2, VG->PDA)  . Diabetes mellitus   . Hypertension   . Arthritis   . Carotid artery disease     a. 01/2013 doppler: >80% RICA stenosis, <16% LICA stenosis;  b. 08/958 R CEA.  . Gastritis 2012 and 09/2011    treated for H Pylori in 12/2011  . Diabetic neuropathy   . Osteoporosis   . COPD (chronic obstructive pulmonary disease)   . Anxiety   . Depression   . ESRD (end stage renal disease)     M-W-Sa  [name]  . Diabetic nephropathy   . GERD (gastroesophageal reflux disease)    . Neuromuscular disorder     peripheral neurotherapy  . Anemia   . A-fib     a. Dx 10/2012, on amio, no anticoagulation 2/2 GIB 10/2012.  [name] GIB (gastrointestinal bleeding)     a. 10/2012, awaiting colonoscopy.   Past Surgical History  Procedure Laterality Date  . Coronary stent placement    . Hip surgery      both hips  . Av fistula placement      Left arm  . Cardiac catheterization    . Joint replacement Bilateral   . Eye surgery Bilateral     cararacts  . Coronary artery bypass graft N/A 02/03/2013    Procedure: CORONARY ARTERY BYPASS GRAFTING (CABG);  Surgeon: Melrose Nakayama, MD;  Location: [place];  Service: Open Heart Surgery;  Laterality: N/A;  . Endarterectomy N/A 02/03/2013    Procedure: ENDARTERECTOMY CAROTID;  Surgeon: Mal Misty, MD;  Location: [place];  Service: Vascular;  Laterality: N/A;  . Olecranon bursectomy Right 07/28/2013    Procedure: RIGHT ELBOW OLECRANON BURSECTOMY ADVANCEMENT CLOSURE ;  Surgeon: Linna Hoff, MD;  Location: [place];  Service: Orthopedics;  Laterality: Right;   Family History  Problem Relation Age of Onset  . Heart attack Father 75  . Heart disease Father   . Stroke Mother   . Hypertension Mother   . Other Mother     varicose veins  . Stroke Maternal Grandmother   .  Hypertension Brother    Social History:  reports that he quit smoking about 23 years ago. His smoking use included Cigarettes. He started smoking about 48 years ago. He has a 25 pack-year smoking history. He has never used smokeless tobacco. He reports that he drinks alcohol. He reports that he does not use illicit drugs. Allergies  Allergen Reactions  . Statins Other (See Comments)    Muscle weakness    Prior to Admission medications   Medication Sig Start Date End Date Taking? Authorizing Provider  acetaminophen (TYLENOL) 500 MG tablet Take 500 mg by mouth every 4 (four) hours as needed (pain).    Yes Historical Provider, MD  allopurinol (ZYLOPRIM) 100 MG tablet  Take 100 mg by mouth 2 (two) times daily.    Yes Historical Provider, MD  amiodarone (PACERONE) 200 MG tablet Take 200 mg by mouth daily.   Yes Historical Provider, MD  aspirin 325 MG tablet Take 325 mg by mouth daily.   Yes Historical Provider, MD  bisacodyl (DULCOLAX) 10 MG suppository Place 10 mg rectally daily as needed for mild constipation or moderate constipation.   Yes Historical Provider, MD  docusate sodium (COLACE) 100 MG capsule Take 100 mg by mouth 2 (two) times daily.   Yes Historical Provider, MD  fish oil-omega-3 fatty acids 1000 MG capsule Take 1 g by mouth 2 (two) times daily.    Yes Historical Provider, MD  gabapentin (NEURONTIN) 300 MG capsule Take 1 capsule (300 mg total) by mouth 2 (two) times daily. 02/15/13  Yes Donielle Liston Alba, PA-C  insulin aspart protamine- aspart (NOVOLOG MIX 70/30) (70-30) 100 UNIT/ML injection Inject 2 Units into the skin 3 (three) times daily with meals.   Yes Historical Provider, MD  ipratropium (ATROVENT) 0.06 % nasal spray Place 2 sprays into both nostrils 3 (three) times daily.    Yes Historical Provider, MD  lidocaine (LIDODERM) 5 % Place 1 patch onto the skin daily. Remove & Discard patch within 12 hours or as directed by MD   Yes Historical Provider, MD  magnesium hydroxide (MILK OF MAGNESIA) 400 MG/5ML suspension Take by mouth every 12 (twelve) hours as needed for mild constipation.   Yes Historical Provider, MD  midodrine (PROAMATINE) 5 MG tablet Take 5 mg by mouth 3 (three) times a week. Take once daily in the morning on dialysis days (usually on Monday, Wednesday and Friday)   Yes Historical Provider, MD  Multiple Vitamins-Minerals (MULTIVITAMIN WITH MINERALS) tablet Take 1 tablet by mouth daily.    Yes Historical Provider, MD  multivitamin (RENA-VIT) TABS tablet Take 1 tablet by mouth at bedtime.    Yes Historical Provider, MD  oxyCODONE (OXY IR/ROXICODONE) 5 MG immediate release tablet Take 5-10 mg by mouth every 4 (four) hours as needed  for moderate pain or severe pain.   Yes Historical Provider, MD  pantoprazole (PROTONIX) 40 MG tablet Take 40 mg by mouth daily.   Yes Historical Provider, MD  polyethylene glycol (MIRALAX / GLYCOLAX) packet Take 17 g by mouth daily.   Yes Historical Provider, MD  senna (SENOKOT) 8.6 MG TABS tablet Take 1 tablet by mouth 2 (two) times daily.   Yes Historical Provider, MD  sevelamer carbonate (RENVELA) 800 MG tablet Take 800 mg by mouth 3 (three) times daily with meals.   Yes Historical Provider, MD  traMADol-acetaminophen (ULTRACET) 37.5-325 MG per tablet Take 1 tablet by mouth 3 (three) times daily as needed (pain).  08/01/13  Yes Historical Provider, MD  Addison Lank  Hazel (PREPARATION H EX) Apply 1 application topically every 6 (six) hours as needed (hemorrhoids).   Yes Historical Provider, MD  zolpidem (AMBIEN) 10 MG tablet Take 10 mg by mouth at bedtime.   Yes Historical Provider, MD   Current Facility-Administered Medications  Medication Dose Route Frequency Provider Last Rate Last Dose  . Gerhardt's butt cream   Topical TID Richarda Blade, MD      . morphine 2 MG/ML injection 1 mg  1 mg Intravenous Q4H PRN Belkys A Regalado, MD      . oxyCODONE-acetaminophen (PERCOCET/ROXICET) 5-325 MG per tablet 1-2 tablet  1-2 tablet Oral Q6H PRN Elmarie Shiley, MD       Current Outpatient Prescriptions  Medication Sig Dispense Refill  . acetaminophen (TYLENOL) 500 MG tablet Take 500 mg by mouth every 4 (four) hours as needed (pain).       [name] allopurinol (ZYLOPRIM) 100 MG tablet Take 100 mg by mouth 2 (two) times daily.       [name] amiodarone (PACERONE) 200 MG tablet Take 200 mg by mouth daily.      [name] aspirin 325 MG tablet Take 325 mg by mouth daily.      . bisacodyl (DULCOLAX) 10 MG suppository Place 10 mg rectally daily as needed for mild constipation or moderate constipation.      . docusate sodium (COLACE) 100 MG capsule Take 100 mg by mouth 2 (two) times daily.      . fish oil-omega-3 fatty acids 1000 MG  capsule Take 1 g by mouth 2 (two) times daily.       [name] gabapentin (NEURONTIN) 300 MG capsule Take 1 capsule (300 mg total) by mouth 2 (two) times daily.      . insulin aspart protamine- aspart (NOVOLOG MIX 70/30) (70-30) 100 UNIT/ML injection Inject 2 Units into the skin 3 (three) times daily with meals.      [name] ipratropium (ATROVENT) 0.06 % nasal spray Place 2 sprays into both nostrils 3 (three) times daily.       [name] lidocaine (LIDODERM) 5 % Place 1 patch onto the skin daily. Remove & Discard patch within 12 hours or as directed by MD      . magnesium hydroxide (MILK OF MAGNESIA) 400 MG/5ML suspension Take by mouth every 12 (twelve) hours as needed for mild constipation.      . midodrine (PROAMATINE) 5 MG tablet Take 5 mg by mouth 3 (three) times a week. Take once daily in the morning on dialysis days (usually on Monday, Wednesday and Friday)      . Multiple Vitamins-Minerals (MULTIVITAMIN WITH MINERALS) tablet Take 1 tablet by mouth daily.       . multivitamin (RENA-VIT) TABS tablet Take 1 tablet by mouth at bedtime.       [name] oxyCODONE (OXY IR/ROXICODONE) 5 MG immediate release tablet Take 5-10 mg by mouth every 4 (four) hours as needed for moderate pain or severe pain.      . pantoprazole (PROTONIX) 40 MG tablet Take 40 mg by mouth daily.      . polyethylene glycol (MIRALAX / GLYCOLAX) packet Take 17 g by mouth daily.      [name] senna (SENOKOT) 8.6 MG TABS tablet Take 1 tablet by mouth 2 (two) times daily.      . sevelamer carbonate (RENVELA) 800 MG tablet Take 800 mg by mouth 3 (three) times daily with meals.      . traMADol-acetaminophen (ULTRACET) 37.5-325 MG per tablet Take 1 tablet by  mouth 3 (three) times daily as needed (pain).       Addison Lank Hazel (PREPARATION H EX) Apply 1 application topically every 6 (six) hours as needed (hemorrhoids).      . zolpidem (AMBIEN) 10 MG tablet Take 10 mg by mouth at bedtime.       Labs: Basic Metabolic Panel:  Recent Labs Lab 09/09/13 1328  NA 138  K 4.4   CL 94*  CO2 29  GLUCOSE 91  BUN 41*  CREATININE 4.45*  CALCIUM 8.4   CBC:  Recent Labs Lab 09/09/13 1328  WBC 17.0*  NEUTROABS 15.0*  HGB 10.0*  HCT 31.9*  MCV 94.1  PLT 303   Studies/Results: Mr [facility]  09/09/2013   This examination belongs to an outside facility and is stored  here for comparison purposes only.  Contact the originating outside  institution for any associated report or interpretation.   ROS: Back pain. Diarrhea. 10- pt ROS asked and answered. All other systems negative except as in the HPI above.   Physical Exam: Filed Vitals:   09/09/13 1345 09/09/13 1500 09/09/13 1530 09/09/13 1554  BP: 105/62 158/74 119/65 113/62  Pulse: 73 83 75 73  Temp:      TempSrc:      Resp: 20 20  20   SpO2: 95% 99% 96% 97%     General: Elderly, well developed, well nourished CM, pleasant, in no acute distress. Head: Normocephalic, atraumatic, sclera non-icteric, mucus membranes are moist Neck: Supple. JVD not elevated. Lungs: Clear bilaterally to auscultation without wheezes, rales, or rhonchi. Breathing is unlabored. Heart: RRR with S1 S2. No murmurs, rubs, or gallops appreciated. Abdomen: Soft, non-tender, non-distended with normoactive bowel sounds. No rebound/guarding. No obvious abdominal masses. M-S:  Strength and tone appear normal for age. Lower extremities:without edema or ischemic changes, no open wounds  Neuro: Alert and oriented X 3. Moves all extremities spontaneously. Psych:  Responds to questions appropriately with a normal affect. Dialysis Access: L AVF   Dialysis Orders: Center: MWF  on [facility] . EDW 69kg HD Bath 2K/2.25Ca  Time 4:00 Heparin 3000 u. Access L AVF BFR 400 DFR 800   Hectorol 2 mcg IV/HD Epogen 5000   Units (recent increase) IV/HD  Venofer  100 mg IV/HD through 1/28  Recent labs: Hgb 10.4, Tsat 15% in December, P 5.0, PTH 296  Assessment/Plan: 1. Unstable L1 fracture/ Ankylosing spondylytis - Mgmt per neurosurgery. Surgical  intervention on hold pending risk evaluation 2. Diarrhea/Leukocytosis - Mgmt per primary. Afebrile. CT with severe wall thickening in sigmoid colon with surrounding stranding (Severe colitis vs tumor) On Flagyl. Possible GI consult.  3. ESRD -  MWF, Missed HD today. K+ 4.4 with no volume excess. Plan HD 1st shift in case plans are made for OR, then again on Monday to get back on schedule. 4. Hypertension/volume  - SBPs 110s. On midodrine. No overt excess on PE.  Close to EDW as op. No wgt here yet.  UF to EDW as tolerated. 5. Anemia  - Hgb 10, consistent with op labs. Epo recently increased. Aranesp 60 ordered q Monday HD. IV Fe load in progress for Tsat of 15% in December - 3 doses remaining. Follow CBC. 6. Metabolic bone disease -  Ca/Phos controlled op. PTH within range. Continue Hectorol 2 and Renvela 1 ac. Renal panel pending. 7. Nutrition - Needs High protein renal diet when appropriate. Multivitamin  8. Afib - On amiodarone. Rate controlled. No anticoagulation secondary to GIB 10/2012. 9.  CAD/ Systolic HF - Hx MI/ CABG 10. DM - on insulin per primary  Sonnie Alamo, PA-C [place] Pager [phone] 09/09/2013, 5:36 PM      I have seen and examined this patient and agree with plan as outlined by K. Cletus Gash, PA-C.  Pt has been c/o back pain since the fall in December and unfortunately the fx was missed at that time.  He was cleared for olecranon surgery several weeks ago and should be stable from a medical standpoint for intervention if needed.  Currently resting comfortably at the moment but unable to ambulate without pain.  Lungs are CTA and will plan for HD tomorrow (off of his MWF schedule). Kenzlei Runions A,MD 09/09/2013 6:59 PM

## 2013-09-09 NOTE — ED Notes (Signed)
Attempted report 

## 2013-09-09 NOTE — Progress Notes (Signed)
Orthopedic Tech Progress Note Patient Details:  Nicholas [name] [birth year] [id number]  Patient ID: [name], male   DOB: 11-Nov-1940, 73 y.o.   MRN: [id number] Called [employer] with brace order; spoke with Dillon Bjork, Raisa Ditto 09/09/2013, 7:23 PM

## 2013-09-09 NOTE — ED Notes (Signed)
MDs at bedside

## 2013-09-09 NOTE — ED Notes (Signed)
To ED from home via EMS, c/o back pain X several weeks following a fall in dec (with admission), pt uses wheelchair, A/O X3, NAD

## 2013-09-09 NOTE — ED Notes (Signed)
Pt transported to radiology.

## 2013-09-09 NOTE — H&P (Signed)
CC:  Chief Complaint  Patient presents with  . Back Pain  . Diarrhea    HPI: Nicholas [name] is a 73 y.o. male who presents to the emergency department brought in by EMS for persistent back pain after a fall approximately one week prior to Christmas. At that time, he says he was walking when his right foot slipped and he fell backwards onto his right side. Since that time he has had significant low back pain which initially was across his back, but now as described primarily as on the right side of his low back. He denies any radiation into either the left or the right leg, or into the groin. He denies any weakness of the legs, or numbness, or tingling. The patient is a dialysis patient and therefore does not produce urine, however he denies any changes in bowel habits, or incontinence.  The patient states that the pain in his back is okay when he is lying down, however it is very severe whenever he tries to sit up or stand. He has been unable to stand on his feet without assistance since his fall, and has been getting around with a wheelchair. Prior to his fall, he was able to ambulate with a cane.  PMH: Past Medical History  Diagnosis Date  . Coronary artery disease     a. hx of MI 1992; b. 10/2012 NSTEMI (no cath 2/2 GIB);  c. 11/2012 MV: Inf defect w inf and septal HK (favor prior MI), EF 47%;  d. 12/2012 Cath: Severe 3vd, EF 45-50%;  e. 01/2013 CABG x 5 (LIMA->LAD, VG->D1, VG->OM1->OM2, VG->PDA)  . Diabetes mellitus   . Hypertension   . Arthritis   . Carotid artery disease     a. 01/2013 doppler: >80% RICA stenosis, <60% LICA stenosis;  b. 01/3015 R CEA.  . Gastritis 2012 and 09/2011    treated for H Pylori in 12/2011  . Diabetic neuropathy   . Osteoporosis   . COPD (chronic obstructive pulmonary disease)   . Anxiety   . Depression   . ESRD (end stage renal disease)     M-W-Sa  [name]  . Diabetic nephropathy   . GERD (gastroesophageal reflux disease)   . Neuromuscular disorder      peripheral neurotherapy  . Anemia   . A-fib     a. Dx 10/2012, on amio, no anticoagulation 2/2 GIB 10/2012.  [name] GIB (gastrointestinal bleeding)     a. 10/2012, awaiting colonoscopy.    PSH: Past Surgical History  Procedure Laterality Date  . Coronary stent placement    . Hip surgery      both hips  . Av fistula placement      Left arm  . Cardiac catheterization    . Joint replacement Bilateral   . Eye surgery Bilateral     cararacts  . Coronary artery bypass graft N/A 02/03/2013    Procedure: CORONARY ARTERY BYPASS GRAFTING (CABG);  Surgeon: Melrose Nakayama, MD;  Location: [place];  Service: Open Heart Surgery;  Laterality: N/A;  . Endarterectomy N/A 02/03/2013    Procedure: ENDARTERECTOMY CAROTID;  Surgeon: Mal Misty, MD;  Location: [place];  Service: Vascular;  Laterality: N/A;  . Olecranon bursectomy Right 07/28/2013    Procedure: RIGHT ELBOW OLECRANON BURSECTOMY ADVANCEMENT CLOSURE ;  Surgeon: Linna Hoff, MD;  Location: [place];  Service: Orthopedics;  Laterality: Right;    SH: History  Substance Use Topics  . Smoking status: Former Smoker -- 1.00  packs/day for 25 years    Types: Cigarettes    Start date: 08/18/1965    Quit date: 08/18/1990  . Smokeless tobacco: Never Used  . Alcohol Use: Yes     Comment: occasionally    MEDS: Prior to Admission medications   Medication Sig Start Date End Date Taking? Authorizing Provider  acetaminophen (TYLENOL) 500 MG tablet Take 500 mg by mouth every 4 (four) hours as needed (pain).    Yes Historical Provider, MD  allopurinol (ZYLOPRIM) 100 MG tablet Take 100 mg by mouth 2 (two) times daily.    Yes Historical Provider, MD  amiodarone (PACERONE) 200 MG tablet Take 200 mg by mouth daily.   Yes Historical Provider, MD  aspirin 325 MG tablet Take 325 mg by mouth daily.   Yes Historical Provider, MD  bisacodyl (DULCOLAX) 10 MG suppository Place 10 mg rectally daily as needed for mild constipation or moderate constipation.   Yes  Historical Provider, MD  docusate sodium (COLACE) 100 MG capsule Take 100 mg by mouth 2 (two) times daily.   Yes Historical Provider, MD  fish oil-omega-3 fatty acids 1000 MG capsule Take 1 g by mouth 2 (two) times daily.    Yes Historical Provider, MD  gabapentin (NEURONTIN) 300 MG capsule Take 1 capsule (300 mg total) by mouth 2 (two) times daily. 02/15/13  Yes Donielle Liston Alba, PA-C  insulin aspart protamine- aspart (NOVOLOG MIX 70/30) (70-30) 100 UNIT/ML injection Inject 2 Units into the skin 3 (three) times daily with meals.   Yes Historical Provider, MD  ipratropium (ATROVENT) 0.06 % nasal spray Place 2 sprays into both nostrils 3 (three) times daily.    Yes Historical Provider, MD  lidocaine (LIDODERM) 5 % Place 1 patch onto the skin daily. Remove & Discard patch within 12 hours or as directed by MD   Yes Historical Provider, MD  magnesium hydroxide (MILK OF MAGNESIA) 400 MG/5ML suspension Take by mouth every 12 (twelve) hours as needed for mild constipation.   Yes Historical Provider, MD  midodrine (PROAMATINE) 5 MG tablet Take 5 mg by mouth 3 (three) times a week. Take once daily in the morning on dialysis days (usually on Monday, Wednesday and Friday)   Yes Historical Provider, MD  Multiple Vitamins-Minerals (MULTIVITAMIN WITH MINERALS) tablet Take 1 tablet by mouth daily.    Yes Historical Provider, MD  multivitamin (RENA-VIT) TABS tablet Take 1 tablet by mouth at bedtime.    Yes Historical Provider, MD  oxyCODONE (OXY IR/ROXICODONE) 5 MG immediate release tablet Take 5-10 mg by mouth every 4 (four) hours as needed for moderate pain or severe pain.   Yes Historical Provider, MD  pantoprazole (PROTONIX) 40 MG tablet Take 40 mg by mouth daily.   Yes Historical Provider, MD  polyethylene glycol (MIRALAX / GLYCOLAX) packet Take 17 g by mouth daily.   Yes Historical Provider, MD  senna (SENOKOT) 8.6 MG TABS tablet Take 1 tablet by mouth 2 (two) times daily.   Yes Historical Provider, MD   sevelamer carbonate (RENVELA) 800 MG tablet Take 800 mg by mouth 3 (three) times daily with meals.   Yes Historical Provider, MD  traMADol-acetaminophen (ULTRACET) 37.5-325 MG per tablet Take 1 tablet by mouth 3 (three) times daily as needed (pain).  08/01/13  Yes Historical Provider, MD  Witch Hazel (PREPARATION H EX) Apply 1 application topically every 6 (six) hours as needed (hemorrhoids).   Yes Historical Provider, MD  zolpidem (AMBIEN) 10 MG tablet Take 10 mg by mouth at  bedtime.   Yes Historical Provider, MD    ALLERGY: Allergies  Allergen Reactions  . Statins Other (See Comments)    Muscle weakness     NEUROLOGIC EXAM: Awake, alert, oriented Memory and concentration grossly intact Speech fluent, appropriate CN grossly intact Motor exam: Upper Extremities Deltoid Bicep Tricep Grip  Right 5/5 5/5 5/5 5/5  Left 5/5 5/5 5/5 5/5   Lower Extremity IP Quad PF DF EHL  Right 5/5 5/5 5/5 5/5 5/5  Left 5/5 5/5 5/5 5/5 5/5   Sensation grossly intact to LT  IMGAING: MRI L spine was reviewed demonstrating a subacute fracture of L1 involving the body and posterior elements likely representing chance fracture. Also seen in fusion of the lumbar vertebral bodies likely indicative of Ankylosing Spondylitis  IMPRESSION: - 73 y.o. male s/p fall who is neurologically intact with subacute unstable L1 fracture which may require stabilization  PLAN: - Admit to floor with medicine consult for multiple medical comorbidities/surgical optimization - CT L-spine without contrast  I spoke to the patient regarding his MRI findings of possibly unstable L1 fracture, as was the finding of likely ankylosing spondylitis. I told him that he may require surgical stabilization of his fracture. The patient understood our discussion, and all his questions were answered.

## 2013-09-10 DIAGNOSIS — E43 Unspecified severe protein-calorie malnutrition: Secondary | ICD-10-CM | POA: Insufficient documentation

## 2013-09-10 LAB — RENAL FUNCTION PANEL
Albumin: 2 g/dL — ABNORMAL LOW (ref 3.5–5.2)
BUN: 49 mg/dL — AB (ref 6–23)
CALCIUM: 8.3 mg/dL — AB (ref 8.4–10.5)
CHLORIDE: 93 meq/L — AB (ref 96–112)
CO2: 25 mEq/L (ref 19–32)
CREATININE: 4.93 mg/dL — AB (ref 0.50–1.35)
GFR calc Af Amer: 12 mL/min — ABNORMAL LOW (ref >90)
GFR calc non Af Amer: 11 mL/min — ABNORMAL LOW (ref >90)
Glucose, Bld: 81 mg/dL (ref 70–99)
Phosphorus: 3.7 mg/dL (ref 2.3–4.6)
Potassium: 4.8 mEq/L (ref 3.7–5.3)
Sodium: 134 mEq/L — ABNORMAL LOW (ref 137–147)

## 2013-09-10 LAB — CLOSTRIDIUM DIFFICILE BY PCR: Toxigenic C. Difficile by PCR: POSITIVE — AB

## 2013-09-10 LAB — CBC
HEMATOCRIT: 31.4 % — AB (ref 39.0–52.0)
Hemoglobin: 9.8 g/dL — ABNORMAL LOW (ref 13.0–17.0)
MCH: 28.7 pg (ref 26.0–34.0)
MCHC: 31.2 g/dL (ref 30.0–36.0)
MCV: 92.1 fL (ref 78.0–100.0)
PLATELETS: 358 10*3/uL (ref 150–400)
RBC: 3.41 MIL/uL — ABNORMAL LOW (ref 4.22–5.81)
RDW: 16.7 % — AB (ref 11.5–15.5)
WBC: 17.9 10*3/uL — AB (ref 4.0–10.5)

## 2013-09-10 LAB — GLUCOSE, CAPILLARY
GLUCOSE-CAPILLARY: 88 mg/dL (ref 70–99)
GLUCOSE-CAPILLARY: 109 mg/dL — AB (ref 70–99)
GLUCOSE-CAPILLARY: 104 mg/dL — AB (ref 70–99)
Glucose-Capillary: 101 mg/dL — ABNORMAL HIGH (ref 70–99)

## 2013-09-10 MED ORDER — PENTAFLUOROPROP-TETRAFLUOROETH EX AERO
INHALATION_SPRAY | CUTANEOUS | Status: AC
Start: 2013-09-10 — End: 2013-09-10
  Administered 2013-09-10: 1
  Filled 2013-09-10: qty 103.5

## 2013-09-10 MED ORDER — VANCOMYCIN 50 MG/ML ORAL SOLUTION
125.0000 mg | Freq: Four times a day (QID) | ORAL | Status: DC
  Administered 2013-09-10 – 2013-09-20 (×33): 125 mg via ORAL
  Filled 2013-09-10 (×45): qty 2.5

## 2013-09-10 MED ORDER — DOXERCALCIFEROL 4 MCG/2ML IV SOLN
INTRAVENOUS | Status: AC
  Administered 2013-09-10: 2 ug via INTRAVENOUS
  Filled 2013-09-10: qty 2

## 2013-09-10 MED ORDER — ONDANSETRON HCL 4 MG/2ML IJ SOLN
4.0000 mg | Freq: Four times a day (QID) | INTRAMUSCULAR | Status: DC | PRN
  Administered 2013-09-10 (×2): 4 mg via INTRAVENOUS
  Filled 2013-09-10 (×2): qty 2

## 2013-09-10 MED ORDER — OXYCODONE HCL 5 MG PO TABS
15.0000 mg | ORAL_TABLET | ORAL | Status: DC | PRN
  Administered 2013-09-10 – 2013-09-20 (×42): 15 mg via ORAL
  Filled 2013-09-10 (×41): qty 3

## 2013-09-10 MED ORDER — NEPRO/CARBSTEADY PO LIQD
237.0000 mL | Freq: Three times a day (TID) | ORAL | Status: DC
  Administered 2013-09-10 – 2013-09-15 (×14): 237 mL via ORAL
  Filled 2013-09-10 (×19): qty 237

## 2013-09-10 NOTE — Progress Notes (Signed)
Patient ID: Nicholas [name], male   DOB: 06-Oct-1940, 73 y.o.   MRN: [id number]  [facility] Progress Note    Subjective:   Reports having diarrhea   Objective:   BP 111/69  Pulse 83  Temp(Src) 97.8 F (36.6 C) (Oral)  Resp 16  Ht 5\' 6"  (1.676 m)  Wt 69.1 kg (152 lb 5.4 oz)  BMI 24.60 kg/m2  SpO2 91%  Intake/Output:     Intake/Output this shift:    Weight change:   Physical Exam: Gen:WD WN elderly WM in mild distress CVS:no rub Resp:cta WNU:UVOZDG Ext:tr edema, LAVF +T/B  Labs: BMET  Recent Labs Lab 09/09/13 1328 09/10/13 0434  NA 138 134*  K 4.4 4.8  CL 94* 93*  CO2 29 25  GLUCOSE 91 81  BUN 41* 49*  CREATININE 4.45* 4.93*  ALBUMIN  --  2.0*  CALCIUM 8.4 8.3*  PHOS  --  3.7   CBC  Recent Labs Lab 09/09/13 1328 09/10/13 0434  WBC 17.0* 17.9*  NEUTROABS 15.0*  --   HGB 10.0* 9.8*  HCT 31.9* 31.4*  MCV 94.1 92.1  PLT 303 358    @IMGRELPRIORS @ Medications:    . allopurinol  100 mg Oral BID  . amiodarone  200 mg Oral Daily  . [START ON 09/12/2013] darbepoetin (ARANESP) injection - DIALYSIS  60 mcg Intravenous Q Mon-HD  . docusate sodium  100 mg Oral BID  . [START ON 09/12/2013] doxercalciferol  2 mcg Intravenous Q M,W,F-HD  . gabapentin  300 mg Oral BID  . Gerhardt's butt cream   Topical TID  . heparin  5,000 Units Subcutaneous Q8H  . insulin aspart  0-9 Units Subcutaneous TID WC  . ipratropium  2 spray Each Nare TID  . lidocaine  1 patch Transdermal Q24H  . metronidazole  500 mg Intravenous Q8H  . [START ON 09/12/2013] midodrine  5 mg Oral 3 times weekly  . multivitamin  1 tablet Oral QHS  . multivitamin with minerals  1 tablet Oral Daily  . pantoprazole  40 mg Oral Daily  . polyethylene glycol  17 g Oral Daily  . senna  1 tablet Oral BID  . sevelamer carbonate  800 mg Oral TID WC  . zolpidem  5 mg Oral QHS     Dialysis Orders: Center: MWF on [facility] .  EDW 69kg HD Bath 2K/2.25Ca Time 4:00 Heparin 3000 u. Access L AVF  BFR 400 DFR 800  Hectorol 2 mcg IV/HD Epogen 5000 Units (recent increase) IV/HD Venofer 100 mg IV/HD through 1/28  Recent labs: Hgb 10.4, Tsat 15% in December, P 5.0, PTH 296   Assessment/Plan:  1. Unstable L1 fracture/ Ankylosing spondylytis - Mgmt per neurosurgery. Surgical intervention on hold pending risk evaluation 2. C. Diff Colitis- +PCR.  Mgmt per primary. Afebrile. CT with severe wall thickening in sigmoid colon with surrounding stranding (Severe colitis vs tumor) On Flagyl. Possible GI consult.  3. ESRD - MWF, off schedule, HD today and get back on schedule on Monday.   4. Hypertension/volume - SBPs 110s. On midodrine. No overt excess on PE. Close to EDW as op. No wgt here yet. UF to EDW as tolerated. 5. Anemia - Hgb 10, consistent with op labs. Epo recently increased. Aranesp 60 ordered q Monday HD. IV Fe load in progress for Tsat of 15% in December - 3 doses remaining. Follow CBC. 6. Metabolic bone disease - Ca/Phos controlled op. PTH within range. Continue Hectorol 2 and Renvela 1 ac. Renal  panel pending. 7. Nutrition - Needs High protein renal diet when appropriate. Multivitamin  8. Afib - On amiodarone. Rate controlled. No anticoagulation secondary to GIB 03/1855. 9. CAD/ Systolic HF - Hx MI/ CABG 10. DM - on insulin per primary 11.  Labaron Digirolamo A 09/10/2013, 11:11 AM

## 2013-09-10 NOTE — Progress Notes (Signed)
Note: This document was prepared with digital dictation and possible smart phrase technology. Any transcriptional errors that result from this process are unintentional.   Nicholas [name] YSA:[id number] DOB: May 08, 1941 DOA: 09/09/2013 PCP: [facility]  Brief narrative: 73 Y/O ?, known ESRD MWF HD [facility] 10/19/12 admit], recent admission 12/20 c Fall+syncope, episodes of admission for Acute/chron Resp falure, s/p CABGx5+ RCEA,  02/03/13, documented L hemidiaphragm paralysis, , EF 45-50% on Cardiac cath 01/11/13 [NSTEMI 10/19/12-P AFib CHad2Vasc2 score~3-4 not on AC due to GI bleed 09/17/12-[facility]oscopy] Admitted 09/09/13 with acute worsening of Lower back pain after Xmas fall. MRI back1/23=acute # L1 body/Post element [CHance#].  Found to have "colitis"/"tumour" on CT Lumbar spine-no diarrhea yet-awaiting CDIFF result  Past medical history-As per Problem list Chart reviewed as below-   Consultants:  Renal  Procedures:  Dialysed 1/24-hypotensive slighlty  Antibiotics:  Flagyll 1/23   Subjective  Well. 6/10 pain at rest 8/10 on moving.  No reported diarr. Tol diet No CP. NO fever no chills   Objective    Interim History: none  Telemetry: placed on tele  Objective: Filed Vitals:   09/10/13 0721 09/10/13 0728 09/10/13 0745 09/10/13 0754  BP: 124/76 118/73 84/59 76/52   Pulse: 72 73 77 78  Temp:      TempSrc:      Resp: 18 18 16 16   Height:      Weight:      SpO2:       No intake or output data in the 24 hours ending 09/10/13 0818  Exam:  General:   Cardiovascular: EOMI, frail, Body mass index is 24.6 kg/(m^2). Respiratory: s1 s2 no m/r/g Abdomen: soft, NT, ND Skinno LE edema, Midline chest scar Neuro intact.  Have not assesed ROM 2/2 to pain  Data Reviewed: Basic Metabolic Panel:  Recent Labs Lab 09/09/13 1328 09/10/13 0434  NA 138 134*  K 4.4 4.8  CL 94* 93*  CO2 29 25  GLUCOSE 91 81  BUN 41* 49*  CREATININE 4.45* 4.93*    CALCIUM 8.4 8.3*  PHOS  --  3.7   Liver Function Tests:  Recent Labs Lab 09/10/13 0434  ALBUMIN 2.0*   No results found for this basename: LIPASE, AMYLASE,  in the last 168 hours No results found for this basename: AMMONIA,  in the last 168 hours CBC:  Recent Labs Lab 09/09/13 1328 09/10/13 0434  WBC 17.0* 17.9*  NEUTROABS 15.0*  --   HGB 10.0* 9.8*  HCT 31.9* 31.4*  MCV 94.1 92.1  PLT 303 358   Cardiac Enzymes:  Recent Labs Lab 09/09/13 1750  TROPONINI <0.30   BNP: No components found with this basename: POCBNP,  CBG:  Recent Labs Lab 09/10/13 0628  GLUCAP 101*    No results found for this or any previous visit (from the past 240 hour(s)).   Studies:              All Imaging reviewed and is as per above notation   Scheduled Meds: . allopurinol  100 mg Oral BID  . amiodarone  200 mg Oral Daily  . [START ON 09/12/2013] darbepoetin (ARANESP) injection - DIALYSIS  60 mcg Intravenous Q Mon-HD  . docusate sodium  100 mg Oral BID  . [START ON 09/12/2013] doxercalciferol  2 mcg Intravenous Q M,W,F-HD  . gabapentin  300 mg Oral BID  . Gerhardt's butt cream   Topical TID  . heparin  5,000 Units Subcutaneous Q8H  . insulin aspart  0-9 Units Subcutaneous TID WC  . ipratropium  2 spray Each Nare TID  . lidocaine  1 patch Transdermal Q24H  . metronidazole  500 mg Intravenous Q8H  . [START ON 09/12/2013] midodrine  5 mg Oral 3 times weekly  . multivitamin  1 tablet Oral QHS  . multivitamin with minerals  1 tablet Oral Daily  . pantoprazole  40 mg Oral Daily  . polyethylene glycol  17 g Oral Daily  . senna  1 tablet Oral BID  . sevelamer carbonate  800 mg Oral TID WC  . white petrolatum      . zolpidem  5 mg Oral QHS   Continuous Infusions: . sodium chloride 20 mL/hr at 09/10/13 0050     Assessment/Plan:  1. Acute L1 #-Per NS.  Mod-high risk from generalist standpoint, but imperative this is done.  Continue Oral opiates as per primary service 2. ESRD MWF  [facility]-appreciate nephro assist-Keep Vol even.  Hypotensive on dialysis today.  Continue Midordine 5 mg c dialysis--He has an effusion which looks worse as per Prior notes by Dr. Eppie Gibson this is 2/2 to CHRONIC L hemidiaphragm elevation.  Might need further UF? 3. ?Diarrhea-Await CDIFF.  Unlikely. D/c Flagyll when results are back 4. P Afib-not on Coumadin.  On Amiodarone 200 daily.  Rate Controlled.  Monitor on Tele 5. Chronic Diastolic HF- EHUD14 % as of ECHO 12/02/12-Euvolemic.  Dialysis dependant. 6. DM ty 2-SS1 Coverage.  Hold 70/30 for now until decisions about surgery 7. 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  Maintain volume status carefully-see above discussion 8. 2/2 Hyperparathyroidism-per renal-continue Binders/Calcium supplements 9. AoRD-Receiving IV iron today.  Aranesp per Renal 10. COPD-Mild to mod stage 2-3-continue inhalrers 11. Possible gout-Continue Allopurinol 100 bid  Code Status: Full Family Communication:  No one present Disposition Plan:  Inpatient opending surgery   [name], MD  [facility] Pager [phone] 09/10/2013, 8:18 AM    LOS: 1 day

## 2013-09-10 NOTE — Progress Notes (Signed)
Patient informed RN that he normally have trouble voiding whenever he is in the hospital. Rn did bladder scan on him, and he had greater than 476 urine retained. Straight catheter was done per protocol and 500 cc of urine was obtained. MD will be notified.

## 2013-09-10 NOTE — Progress Notes (Signed)
No issues overnight. Pt reports continued back pain when moving, as well as nausea and difficulty urinating while in the hospital.  EXAM:  BP 122/74  Pulse 90  Temp(Src) 98.3 F (36.8 C) (Oral)  Resp 14  Ht 5\' 6"  (1.676 m)  Wt 68.8 kg (151 lb 10.8 oz)  BMI 24.49 kg/m2  SpO2 98%  Awake, alert, oriented  Speech fluent, appropriate  CN grossly intact  5/5 BUE/BLE   IMAGING: CT Lspine reviewed, demonstrating evidence of ankylosing spondylitis, with fusion of L2-S1. There is severe osteopenia. Chance fracture through L1 is again seen.  IMPRESSION:  73 y.o. male with L1 chance fracture and severe osteopenia with ankylosing spondylitis and multiple significant medical problems including CAD s/p CABGx5 and ESRD. In this setting, operative stabilization of the fracture seems doomed to failure due to poor bone quality and AS, in addition to significant risk of perioperative complication.  PLAN: - LSO brace while in bed - Strict bedrest for 7-10 days, then can attempt to mobilize - Pain control - Appreciate medicine input, cont HD as scheduled.

## 2013-09-10 NOTE — Progress Notes (Deleted)
Per MD, foley can be placed when patient retains urine again after 6-8 hours.

## 2013-09-10 NOTE — Progress Notes (Signed)
INITIAL NUTRITION ASSESSMENT  DOCUMENTATION CODES Per approved criteria  -Severe malnutrition in the context of chronic illness   INTERVENTION:  Nepro Shake po TID, each supplement provides 425 kcal and 19 grams protein  NUTRITION DIAGNOSIS: Inadequate oral intake related to poor appetite as evidenced by 9% weight loss in the past 3 months.   Goal: Intake to meet >90% of estimated nutrition needs.  Monitor:  PO intake, labs, weight trend.  Reason for Assessment: MST  73 y.o. male  Admitting Dx: back pain, diarrhea  ASSESSMENT: Patient is a 73 y.o. male who presents to the emergency department brought in by EMS for persistent back pain after a fall approximately one week prior to Christmas.  Patient reports poor intake at home due to poor appetite. He was only eating 2 meals per day. He drinks Nepro at home sometimes.  Pt meets criteria for severe MALNUTRITION in the context of chronic illness as evidenced by 9% weight loss in 3 months with intake <75% of estimated energy requirement for > 1 month.  Height: Ht Readings from Last 1 Encounters:  09/09/13 5\' 6"  (1.676 m)    Weight: Wt Readings from Last 1 Encounters:  09/10/13 151 lb 10.8 oz (68.8 kg)    Ideal Body Weight: 64.5 kg  % Ideal Body Weight: 107%  Wt Readings from Last 10 Encounters:  09/10/13 151 lb 10.8 oz (68.8 kg)  08/08/13 168 lb 3.4 oz (76.3 kg)  07/21/13 166 lb 3 oz (75.382 kg)  03/29/13 164 lb (74.39 kg)  03/15/13 164 lb 14.4 oz (74.798 kg)  03/03/13 169 lb (76.658 kg)  03/01/13 169 lb (76.658 kg)  02/22/13 163 lb (73.936 kg)  02/15/13 176 lb 2.4 oz (79.9 kg)  02/15/13 176 lb 2.4 oz (79.9 kg)    Usual Body Weight: 166 lb  % Usual Body Weight: 91%  BMI:  Body mass index is 24.49 kg/(m^2).  Estimated Nutritional Needs: Kcal: 2000-2400 Protein: 83-97 gm Fluid: 1.2 L  Skin: no wounds  Diet Order: Renal 60/70-2-2 with 1200 ml fluid restriction  EDUCATION NEEDS: -Education not  appropriate at this time   Intake/Output Summary (Last 24 hours) at 09/10/13 1138 Last data filed at 09/10/13 1128  Gross per 24 hour  Intake      0 ml  Output     94 ml  Net    -94 ml    Last BM: 1/23   Labs:   Recent Labs Lab 09/09/13 1328 09/10/13 0434  NA 138 134*  K 4.4 4.8  CL 94* 93*  CO2 29 25  BUN 41* 49*  CREATININE 4.45* 4.93*  CALCIUM 8.4 8.3*  PHOS  --  3.7  GLUCOSE 91 81    CBG (last 3)   Recent Labs  09/10/13 0628  GLUCAP 101*    Scheduled Meds: . allopurinol  100 mg Oral BID  . amiodarone  200 mg Oral Daily  . [START ON 09/12/2013] darbepoetin (ARANESP) injection - DIALYSIS  60 mcg Intravenous Q Mon-HD  . docusate sodium  100 mg Oral BID  . [START ON 09/12/2013] doxercalciferol  2 mcg Intravenous Q M,W,F-HD  . gabapentin  300 mg Oral BID  . Gerhardt's butt cream   Topical TID  . heparin  5,000 Units Subcutaneous Q8H  . insulin aspart  0-9 Units Subcutaneous TID WC  . ipratropium  2 spray Each Nare TID  . lidocaine  1 patch Transdermal Q24H  . metronidazole  500 mg Intravenous Q8H  . [START  ON 09/12/2013] midodrine  5 mg Oral 3 times weekly  . multivitamin  1 tablet Oral QHS  . multivitamin with minerals  1 tablet Oral Daily  . pantoprazole  40 mg Oral Daily  . polyethylene glycol  17 g Oral Daily  . senna  1 tablet Oral BID  . sevelamer carbonate  800 mg Oral TID WC  . zolpidem  5 mg Oral QHS    Continuous Infusions: . sodium chloride 20 mL/hr at 09/10/13 0050    Past Medical History  Diagnosis Date  . Coronary artery disease     a. hx of MI 1992; b. 10/2012 NSTEMI (no cath 2/2 GIB);  c. 11/2012 MV: Inf defect w inf and septal HK (favor prior MI), EF 47%;  d. 12/2012 Cath: Severe 3vd, EF 45-50%;  e. 01/2013 CABG x 5 (LIMA->LAD, VG->D1, VG->OM1->OM2, VG->PDA)  . Diabetes mellitus   . Hypertension   . Arthritis   . Carotid artery disease     a. 01/2013 doppler: >80% RICA stenosis, <18% LICA stenosis;  b. 03/4165 R CEA.  . Gastritis 2012  and 09/2011    treated for H Pylori in 12/2011  . Diabetic neuropathy   . Osteoporosis   . COPD (chronic obstructive pulmonary disease)   . Anxiety   . Depression   . ESRD (end stage renal disease)     M-W-Sa  [name]  . Diabetic nephropathy   . GERD (gastroesophageal reflux disease)   . Neuromuscular disorder     peripheral neurotherapy  . Anemia   . A-fib     a. Dx 10/2012, on amio, no anticoagulation 2/2 GIB 10/2012.  [name] GIB (gastrointestinal bleeding)     a. 10/2012, awaiting colonoscopy.    Past Surgical History  Procedure Laterality Date  . Coronary stent placement    . Hip surgery      both hips  . Av fistula placement      Left arm  . Cardiac catheterization    . Joint replacement Bilateral   . Eye surgery Bilateral     cararacts  . Coronary artery bypass graft N/A 02/03/2013    Procedure: CORONARY ARTERY BYPASS GRAFTING (CABG);  Surgeon: Melrose Nakayama, MD;  Location: [place];  Service: Open Heart Surgery;  Laterality: N/A;  . Endarterectomy N/A 02/03/2013    Procedure: ENDARTERECTOMY CAROTID;  Surgeon: Mal Misty, MD;  Location: [place];  Service: Vascular;  Laterality: N/A;  . Olecranon bursectomy Right 07/28/2013    Procedure: RIGHT ELBOW OLECRANON BURSECTOMY ADVANCEMENT CLOSURE ;  Surgeon: Linna Hoff, MD;  Location: [place];  Service: Orthopedics;  Laterality: Right;    Molli Barrows, RD, LDN, [place] Pager [phone] After Hours Pager [phone]

## 2013-09-11 LAB — GLUCOSE, CAPILLARY
Glucose-Capillary: 88 mg/dL (ref 70–99)
Glucose-Capillary: 114 mg/dL — ABNORMAL HIGH (ref 70–99)
Glucose-Capillary: 118 mg/dL — ABNORMAL HIGH (ref 70–99)
Glucose-Capillary: 113 mg/dL — ABNORMAL HIGH (ref 70–99)

## 2013-09-11 MED ORDER — SODIUM CHLORIDE 0.9 % IV SOLN
125.0000 mg | INTRAVENOUS | Status: AC
  Administered 2013-09-12 – 2013-09-14 (×2): 125 mg via INTRAVENOUS
  Filled 2013-09-11 (×5): qty 10

## 2013-09-11 MED ORDER — PROMETHAZINE HCL 25 MG PO TABS
12.5000 mg | ORAL_TABLET | Freq: Three times a day (TID) | ORAL | Status: DC | PRN
  Administered 2013-09-11 – 2013-09-17 (×7): 12.5 mg via ORAL
  Filled 2013-09-11 (×7): qty 1

## 2013-09-11 NOTE — Progress Notes (Signed)
Note: This document was prepared with digital dictation and possible smart phrase technology. Any transcriptional errors that result from this process are unintentional.   Nicholas [name] IZT:[id number] DOB: February 17, 1941 DOA: 09/09/2013 PCP: [facility]  Brief narrative: 73 Y/O ?, known ESRD MWF HD [facility] 10/19/12 admit], recent admission 12/20 c Fall+syncope, episodes of admission for Acute/chron Resp falure, s/p CABGx5+ RCEA,  02/03/13, documented L hemidiaphragm paralysis, , EF 45-50% on Cardiac cath 01/11/13 [NSTEMI 10/19/12-P AFib CHad2Vasc2 score~3-4 not on AC due to GI bleed 09/17/12-[facility]] Admitted 09/09/13 with acute worsening of Lower back pain after Xmas fall. MRI back1/23=acute # L1 body/Post element [CHance#].  Found to have "colitis"/"tumour" on CT Lumbar spine- C. difficile came back as +1/24-Flagyl transitioned to by mouth vancomycin  Past medical history-As per Problem list Chart reviewed as below-   Consultants:  Renal  Procedures:  Dialysed 1/24-hypotensive slighlty  Antibiotics:  Flagyll 1/23-1/24  Vancomycin 1/24   Subjective  Wants a different Nursing home Pain seems manageable Nursing curious about mobility restrictions with fracture-patient bedbound with a TLSO brace No further diarrhea today 2 episodes last night No fever no chills Tolerating diet No chest pain or shortness of breath    Objective    Interim History: none  Telemetry: placed on tele  Objective: Filed Vitals:   09/10/13 1901 09/10/13 2100 09/11/13 0020 09/11/13 0540  BP: 122/74 126/71 119/64 130/71  Pulse: 90 84 80 82  Temp: 98.3 F (36.8 C) 97.9 F (36.6 C) 98.1 F (36.7 C) 98.1 F (36.7 C)  TempSrc: Oral Oral Oral Oral  Resp: 14 18 18 18   Height:      Weight:      SpO2: 98% 96% 96% 94%    Intake/Output Summary (Last 24 hours) at 09/11/13 1026 Last data filed at 09/10/13 1300  Gross per 24 hour  Intake      0 ml  Output   1094 ml    Net  -1094 ml    Exam:  General:   Cardiovascular: EOMI, frail, Body mass index is 24.49 kg/(m^2). Respiratory: s1 s2 no m/r/g Abdomen: soft, NT, ND Skinno LE edema, Midline chest scar Neuro intact.  Have not assesed ROM 2/2 to pain  Data Reviewed: Basic Metabolic Panel:  Recent Labs Lab 09/09/13 1328 09/10/13 0434  NA 138 134*  K 4.4 4.8  CL 94* 93*  CO2 29 25  GLUCOSE 91 81  BUN 41* 49*  CREATININE 4.45* 4.93*  CALCIUM 8.4 8.3*  PHOS  --  3.7   Liver Function Tests:  Recent Labs Lab 09/10/13 0434  ALBUMIN 2.0*   No results found for this basename: LIPASE, AMYLASE,  in the last 168 hours No results found for this basename: AMMONIA,  in the last 168 hours CBC:  Recent Labs Lab 09/09/13 1328 09/10/13 0434  WBC 17.0* 17.9*  NEUTROABS 15.0*  --   HGB 10.0* 9.8*  HCT 31.9* 31.4*  MCV 94.1 92.1  PLT 303 358   Cardiac Enzymes:  Recent Labs Lab 09/09/13 1750  TROPONINI <0.30   BNP: No components found with this basename: POCBNP,  CBG:  Recent Labs Lab 09/10/13 0628 09/10/13 1152 09/10/13 1646 09/10/13 2105 09/11/13 0654  GLUCAP 101* 88 109* 104* 88    Recent Results (from the past 240 hour(s))  CLOSTRIDIUM DIFFICILE BY PCR     Status: Abnormal   Collection Time    09/10/13 12:56 AM      Result Value Range Status  C difficile by pcr POSITIVE (*) NEGATIVE Final   Comment: CRITICAL RESULT CALLED TO, READ BACK BY AND VERIFIED WITH:     E.ANSOMAA,RN 1036 09/10/13 M.CAMPBELL     Studies:              All Imaging reviewed and is as per above notation   Scheduled Meds: . allopurinol  100 mg Oral BID  . amiodarone  200 mg Oral Daily  . [START ON 09/12/2013] darbepoetin (ARANESP) injection - DIALYSIS  60 mcg Intravenous Q Mon-HD  . docusate sodium  100 mg Oral BID  . [START ON 09/12/2013] doxercalciferol  2 mcg Intravenous Q M,W,F-HD  . feeding supplement (NEPRO CARB STEADY)  237 mL Oral TID BM  . gabapentin  300 mg Oral BID  . Gerhardt's  butt cream   Topical TID  . heparin  5,000 Units Subcutaneous Q8H  . insulin aspart  0-9 Units Subcutaneous TID WC  . ipratropium  2 spray Each Nare TID  . lidocaine  1 patch Transdermal Q24H  . [START ON 09/12/2013] midodrine  5 mg Oral 3 times weekly  . multivitamin  1 tablet Oral QHS  . multivitamin with minerals  1 tablet Oral Daily  . pantoprazole  40 mg Oral Daily  . polyethylene glycol  17 g Oral Daily  . senna  1 tablet Oral BID  . sevelamer carbonate  800 mg Oral TID WC  . vancomycin  125 mg Oral QID  . zolpidem  5 mg Oral QHS   Continuous Infusions: . sodium chloride 20 mL/hr at 09/10/13 0050     Assessment/Plan:  1. C. difficile-complete 14 days of vancomycin treatment 09/24/13-have discontinued Protonix, senna, MiraLax, MOM, Dulcolax, Colace 2. Acute L1 #-Per NS.  Apparently not a surgical candidate given metabolic bone disease? Have requested nursing to let me know when neurosurgery rounds-needs clarification as to activity precautions-therapy eval is requested 3. ESRD MWF [facility]-appreciate nephro assist-Keep Vol even.  Normotensive today. Continue Midrin with dialysis 4. P Afib-not on Coumadin.  On Amiodarone 200 daily.  Rate Controlled.  Monitor on Tele 5. Chronic Diastolic HF- [id number] % as of ECHO 12/02/12-Euvolemic.  Dialysis dependant. 6. DM ty 2-SS1 Coverage.  Hold 70/30 for now until decisions about surgery-blood sugars controlled with sliding scale insulin-88-104  7. 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  Maintain volume status carefully-see above discussion 8. 2/2 Hyperparathyroidism-per renal-continue Binders/Calcium supplements 9. AoRD-Receiving IV iron today.  Aranesp per Renal 10. 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 Possible gout-Continue Allopurinol 100 bid 12. History of upper GI bleed-monitor have discontinued Protonix because of C. difficile  Code Status: Full Family Communication:  Discussed plan of care with patient's  brother [name] on telephone 1/25 Disposition Plan:  Likely can return to skilled nursing facility soon if no need for surgery   [name], MD  [facility] Pager [phone] 09/11/2013, 10:26 AM    LOS: 2 days

## 2013-09-11 NOTE — Progress Notes (Signed)
No issues overnight. Pt reports no significant back pain while laying/sitting in bed.  EXAM:  BP 130/71  Pulse 82  Temp(Src) 98.1 F (36.7 C) (Oral)  Resp 18  Ht 5\' 6"  (1.676 m)  Wt 68.8 kg (151 lb 10.8 oz)  BMI 24.49 kg/m2  SpO2 94%  Awake, alert, oriented  Speech fluent, appropriate  5/5 BUE/BLE   IMPRESSION:  73 y.o. male with ESRD, DM, and CAD s/p CABG x 5 and L1 chance fracture, managed non-operatively due to severe osteopenia and above medical co-morbidities. He remains neurologically stable  PLAN: - Cont bed rest with LSO brace, will attempt to mobilize in a few days. - Appreciate medicine/renal assistance for mgmt of co-morbidities.

## 2013-09-11 NOTE — Progress Notes (Deleted)
C/o nausea; no bowel movements since early am; called MD for prn

## 2013-09-11 NOTE — Progress Notes (Signed)
[facility] Progress Note  Subjective:   Diarrhea continues.  Seems to be in good spirits. Requesting regular diet.  Objective Filed Vitals:   09/10/13 1901 09/10/13 2100 09/11/13 0020 09/11/13 0540  BP: 122/74 126/71 119/64 130/71  Pulse: 90 84 80 82  Temp: 98.3 F (36.8 C) 97.9 F (36.6 C) 98.1 F (36.7 C) 98.1 F (36.7 C)  TempSrc: Oral Oral Oral Oral  Resp: 14 18 18 18   Height:      Weight:      SpO2: 98% 96% 96% 94%   Physical Exam General: WD, WN WM in NAD Heart:no rub Lungs:cta Abdomen:benign Extremities:LAVF +T/B, no edema Dialysis Access:   Dialysis Orders: Center: MWF on [facility] .  EDW 69kg HD Bath 2K/2.25Ca Time 4:00 Heparin 3000 u. Access L AVF BFR 400 DFR 800  Hectorol 2 mcg IV/HD Epogen 5000 Units (recent increase) IV/HD Venofer 100 mg IV/HD through 1/28  Recent labs: Hgb 10.4, Tsat 15% in December, P 5.0, PTH 296   Assessment/Plan: 1. Unstable L1 fracture/ Ankylosing spondylytis - Mgmt per neurosurgery. No stabilization surgery due to severe osteopenia and significant risk of complication. Strict bed rest for 7-10 days + back brace which has not been applied yet. (It weighs at least 1.5kg if not more. Will need to account for this in HD) 2. C. Diff Colitis - +PCR. Mgmt per primary. On PO vanc.  3. ESRD - MWF, Resume regular schedule tomorrow. 4. Hypertension/volume - SBPs 130s. On midodrine. No overt excess on PE. Slightly under edw. UF 2L tomorrow as tolerated. 5. Anemia - Hgb 9.8 < 10, consistent with op labs. Epo recently increased. Aranesp 60 ordered q Monday HD. IV Fe load in progress for Tsat of 15% in December - 3 doses remaining. Follow CBC.  6. Metabolic bone disease - Ca/Phos controlled op. PTH within range. Continue Hectorol 2 and Renvela 1 ac. Renal panel pending.  7. Nutrition - Low alb. Not eating. Ok to [employer]. Multivitamin  8. Afib - On amiodarone. Rate controlled. No anticoagulation secondary to GIB 10/2012.  9. CAD/  Systolic HF - Hx MI/ CABG  10. DM - on insulin per primary  Santiago Glad E. Rhodia Albright [facility] Pager [phone] 09/11/2013,10:40 AM  LOS: 2 days    Additional Objective Labs: Basic Metabolic Panel:  Recent Labs Lab 09/09/13 1328 09/10/13 0434  NA 138 134*  K 4.4 4.8  CL 94* 93*  CO2 29 25  GLUCOSE 91 81  BUN 41* 49*  CREATININE 4.45* 4.93*  CALCIUM 8.4 8.3*  PHOS  --  3.7   Liver Function Tests:  Recent Labs Lab 09/10/13 0434  ALBUMIN 2.0*   CBC:  Recent Labs Lab 09/09/13 1328 09/10/13 0434  WBC 17.0* 17.9*  NEUTROABS 15.0*  --   HGB 10.0* 9.8*  HCT 31.9* 31.4*  MCV 94.1 92.1  PLT 303 358   Blood Culture    Component Value Date/Time   SDES BLOOD RIGHT HAND 02/16/2013 2116   SPECREQUEST BOTTLES DRAWN AEROBIC AND ANAEROBIC 10CC 02/16/2013 2116   CULT NO GROWTH 5 DAYS 02/16/2013 2116   REPTSTATUS 02/23/2013 FINAL 02/16/2013 2116    Cardiac Enzymes:  Recent Labs Lab 09/09/13 1750  TROPONINI <0.30   CBG:  Recent Labs Lab 09/10/13 0628 09/10/13 1152 09/10/13 1646 09/10/13 2105 09/11/13 0654  GLUCAP 101* 88 109* 104* 88    Studies/Results: Dg Chest 2 View  09/09/2013   CLINICAL DATA:  Leukocytosis.  Preop back surgery.  EXAM:  CHEST  2 VIEW  COMPARISON:  07/21/2013  FINDINGS: The patient is status post median sternotomy and CABG procedure. There is mild to moderate cardiac enlargement. Chronic asymmetric elevation of the left hemidiaphragm is noted. There is a new left pleural effusion identified. Atelectasis is present in both lung bases. There is advanced osteoarthritis involving both glenohumeral joints.  IMPRESSION: 1. New left pleural effusion.   Electronically Signed   By: Kerby Moors M.D.   On: 09/09/2013 18:54   Ct Lumbar Spine Wo Contrast  09/09/2013   CLINICAL DATA:  Back pain.  Fall 6 weeks ago.  L1 fracture.  EXAM: CT LUMBAR SPINE WITHOUT CONTRAST  TECHNIQUE: Multidetector CT imaging of the lumbar spine was performed without  intravenous contrast administration. Multiplanar CT image reconstructions were also generated.  COMPARISON:  09/08/2013  FINDINGS: The spinous processes, facets, an vertebral bodies are all fused in the lumbar spine suggesting severe and advanced ankylosing spondylitis.  There is a transverse cleft in the L1 vertebral body extending longitudinally into the pedicles. This cleft has fluid signal and tiny locules of gas signal, and the fracture appears to continue through the posterior elements in a nondisplaced manner. No subluxation. Irregular margins of the fracture somewhat intact interdigitate.  The T11-12 level does not appear completely fused although there is fusion at T11.  Severe bony demineralization.  The sacroiliac joints are completely fused.  Scattered exophytic lesions of both kidneys have varying complexity.  There is paraspinal hematoma in the vicinity of the fracture. I do not demonstrate significant bony retropulsion.  Aortoiliac atherosclerotic vascular disease is present.  There is considerable abnormal wall thickening and 3 long segment of the sigmoid colon, potentially from prominent inflammation or less likely tumor. Adjacent mesenteric edema noted.  Mild levoconvex lumbar scoliosis. Scattered small retroperitoneal lymph nodes are present.  IMPRESSION: 1. Acute nondisplaced transverse fracture through the vertebral body and posterior elements of L1 (Chance fracture), with associated paraspinal edema. 2. Markedly severe manifestations of ankylosing spondylitis with complete anterior and posterior fusion in the lumbar spine and complete fusion of the sacroiliac joints. 3. Severe wall thickening in the sigmoid colon with surrounding stranding, potentially from severe colitis or tumor. 4. Bilateral complex renal exophytic lesions, probably complex cysts although these are not characterized on today's exam with regard to malignant potential.   Electronically Signed   By: Sherryl Barters M.D.   On:  09/09/2013 17:44   Mr Outside Films Spine  09/09/2013   This examination belongs to an outside facility and is stored  here for comparison purposes only.  Contact the originating outside  institution for any associated report or interpretation.  Medications: . sodium chloride 20 mL/hr at 09/10/13 0050   . allopurinol  100 mg Oral BID  . amiodarone  200 mg Oral Daily  . [START ON 09/12/2013] darbepoetin (ARANESP) injection - DIALYSIS  60 mcg Intravenous Q Mon-HD  . [START ON 09/12/2013] doxercalciferol  2 mcg Intravenous Q M,W,F-HD  . feeding supplement (NEPRO CARB STEADY)  237 mL Oral TID BM  . gabapentin  300 mg Oral BID  . Gerhardt's butt cream   Topical TID  . heparin  5,000 Units Subcutaneous Q8H  . insulin aspart  0-9 Units Subcutaneous TID WC  . ipratropium  2 spray Each Nare TID  . lidocaine  1 patch Transdermal Q24H  . [START ON 09/12/2013] midodrine  5 mg Oral 3 times weekly  . multivitamin  1 tablet Oral QHS  . multivitamin with minerals  1 tablet Oral Daily  . sevelamer carbonate  800 mg Oral TID WC  . vancomycin  125 mg Oral QID  . zolpidem  5 mg Oral QHS   I have seen and examined this patient and agree with plan as outlined by K. Cletus Gash, PA-C.  Pt appears that pt will not be a surgical candidate and will be placed in a LSO brace (for fear of poor quality bone for stabilization).  Cont with HD qMWF, disposition back to SNF or LTC per Neurosurgery. Akeia Perot A,MD 09/11/2013 11:04 AM

## 2013-09-12 LAB — CBC WITH DIFFERENTIAL/PLATELET
BASOS ABS: 0 10*3/uL (ref 0.0–0.1)
Basophils Relative: 0 % (ref 0–1)
EOS ABS: 0.3 10*3/uL (ref 0.0–0.7)
EOS PCT: 2 % (ref 0–5)
HEMATOCRIT: 34.6 % — AB (ref 39.0–52.0)
Hemoglobin: 10.7 g/dL — ABNORMAL LOW (ref 13.0–17.0)
LYMPHS PCT: 9 % — AB (ref 12–46)
Lymphs Abs: 1.4 10*3/uL (ref 0.7–4.0)
MCH: 29.2 pg (ref 26.0–34.0)
MCHC: 30.9 g/dL (ref 30.0–36.0)
MCV: 94.5 fL (ref 78.0–100.0)
MONO ABS: 1.3 10*3/uL — AB (ref 0.1–1.0)
Monocytes Relative: 8 % (ref 3–12)
Neutro Abs: 12.2 10*3/uL — ABNORMAL HIGH (ref 1.7–7.7)
Neutrophils Relative %: 80 % — ABNORMAL HIGH (ref 43–77)
Platelets: 390 10*3/uL (ref 150–400)
RBC: 3.66 MIL/uL — ABNORMAL LOW (ref 4.22–5.81)
RDW: 17.2 % — AB (ref 11.5–15.5)
WBC: 15.3 10*3/uL — AB (ref 4.0–10.5)

## 2013-09-12 LAB — GLUCOSE, CAPILLARY
GLUCOSE-CAPILLARY: 97 mg/dL (ref 70–99)
GLUCOSE-CAPILLARY: 105 mg/dL — AB (ref 70–99)
Glucose-Capillary: 118 mg/dL — ABNORMAL HIGH (ref 70–99)
Glucose-Capillary: 96 mg/dL (ref 70–99)

## 2013-09-12 LAB — HEPATITIS B SURFACE ANTIGEN: Hepatitis B Surface Ag: NEGATIVE

## 2013-09-12 LAB — RENAL FUNCTION PANEL
Albumin: 2.1 g/dL — ABNORMAL LOW (ref 3.5–5.2)
BUN: 38 mg/dL — ABNORMAL HIGH (ref 6–23)
CALCIUM: 8.4 mg/dL (ref 8.4–10.5)
CO2: 26 meq/L (ref 19–32)
Chloride: 91 mEq/L — ABNORMAL LOW (ref 96–112)
Creatinine, Ser: 4.15 mg/dL — ABNORMAL HIGH (ref 0.50–1.35)
GFR calc Af Amer: 15 mL/min — ABNORMAL LOW (ref >90)
GFR calc non Af Amer: 13 mL/min — ABNORMAL LOW (ref >90)
GLUCOSE: 80 mg/dL (ref 70–99)
Phosphorus: 2.5 mg/dL (ref 2.3–4.6)
Potassium: 4 mEq/L (ref 3.7–5.3)
Sodium: 134 mEq/L — ABNORMAL LOW (ref 137–147)

## 2013-09-12 MED ORDER — HEPARIN SODIUM (PORCINE) 1000 UNIT/ML IJ SOLN
3000.0000 [IU] | Freq: Once | INTRAMUSCULAR | Status: AC
  Administered 2013-09-12: 3000 [IU] via INTRAVENOUS

## 2013-09-12 MED ORDER — DOXERCALCIFEROL 4 MCG/2ML IV SOLN
INTRAVENOUS | Status: AC
  Filled 2013-09-12: qty 2

## 2013-09-12 MED ORDER — DARBEPOETIN ALFA-POLYSORBATE 60 MCG/0.3ML IJ SOLN
INTRAMUSCULAR | Status: AC
  Filled 2013-09-12: qty 0.3

## 2013-09-12 NOTE — Progress Notes (Signed)
No issues overnight. Pt does c/o some back pain, but no c/o weakness.  EXAM:  BP 110/61  Pulse 74  Temp(Src) 98.6 F (37 C) (Oral)  Resp 16  Ht 5\' 6"  (1.676 m)  Wt 70.2 kg (154 lb 12.2 oz)  BMI 24.99 kg/m2  SpO2 93%  Awake, alert, oriented  Speech fluent, appropriate  CN grossly intact  5/5 BUE/BLE  Brace in place  IMPRESSION:  73 y.o. male with L1 chance fracture in the setting of severe osteopenia and ankylosing spondylitis with multiple significant medical comorbidities.  PLAN: - Cont bedrest with brace for now, will attempt to mobilize later this week. - Cont dialysis, medical and nephrology assistance appreciated.

## 2013-09-12 NOTE — Procedures (Signed)
I was present at this dialysis session, have reviewed the session itself and made  appropriate changes  Kelly Splinter MD (pgr) [phone]    ([phone] 09/12/2013, 10:31 AM

## 2013-09-12 NOTE — Progress Notes (Signed)
09/12/13 0909  OT Visit Information  Last OT Received On 09/12/13  Reason Eval/Treat Not Completed Patient at procedure or test/ unavailable. Pt at dialysis. Will re-attempt as able.     Roseanne Reno, OTR/L [phone]

## 2013-09-12 NOTE — Progress Notes (Signed)
Note: This document was prepared with digital dictation and possible smart phrase technology. Any transcriptional errors that result from this process are unintentional.   IMRI LOR HEN:[id number] DOB: [birth year] DOA: 09/09/2013 PCP: [facility]  Brief narrative: 73 Y/O ?, known ESRD MWF HD [facility] 10/19/12 admit], recent admission 12/20 c Fall+syncope, episodes of admission for Acute/chron Resp falure, s/p CABGx5+ RCEA,  02/03/13, documented L hemidiaphragm paralysis, , EF 45-50% on Cardiac cath 01/11/13 [NSTEMI 10/19/12-P AFib CHad2Vasc2 score~3-4 not on AC due to GI bleed 09/17/12-[facility]-no colonoscopy] Admitted 09/09/13 with acute worsening of Lower back pain after Xmas fall. MRI back1/23=acute # L1 body/Post element [CHance#].  Found to have "colitis"/"tumour" on CT Lumbar spine- C. difficile came back as +1/24-Flagyl transitioned to by mouth vancomycin  Past medical history-As per Problem list Chart reviewed as below-   Consultants:  Renal  Procedures:  Dialysed 1/24-hypotensive slighlty  Antibiotics:  Flagyll 1/23-1/24  Vancomycin 1/24   Subjective   Seems better Mild abd pain "feels like I have to poop, but not really" NO CP no SOB No further diarrhea Tol diet somewhat No fever    Objective    Interim History: none  Telemetry: placed on tele  Objective: Filed Vitals:   09/12/13 1100 09/12/13 1120 09/12/13 1130 09/12/13 1200  BP: 126/67 126/36 116/55 118/54  Pulse: 102 103 102 102  Temp:      TempSrc:      Resp:      Height:      Weight:      SpO2:       No intake or output data in the 24 hours ending 09/12/13 1226  Exam:  Cardiovascular: EOMI, frail, Body mass index is 25.67 kg/(m^2). Respiratory: s1 s2 no m/r/g Abdomen: soft, NT, ND Skinno LE edema, Midline chest scar Neuro intact.  Have not assesed ROM 2/2 to pain  Data Reviewed: Basic Metabolic Panel:  Recent Labs Lab 09/09/13 1328 09/10/13 0434 09/12/13 0625  NA  138 134* 134*  K 4.4 4.8 4.0  CL 94* 93* 91*  CO2 29 25 26   GLUCOSE 91 81 80  BUN 41* 49* 38*  CREATININE 4.45* 4.93* 4.15*  CALCIUM 8.4 8.3* 8.4  PHOS  --  3.7 2.5   Liver Function Tests:  Recent Labs Lab 09/10/13 0434 09/12/13 0625  ALBUMIN 2.0* 2.1*   No results found for this basename: LIPASE, AMYLASE,  in the last 168 hours No results found for this basename: AMMONIA,  in the last 168 hours CBC:  Recent Labs Lab 09/09/13 1328 09/10/13 0434 09/12/13 0625  WBC 17.0* 17.9* 15.3*  NEUTROABS 15.0*  --  12.2*  HGB 10.0* 9.8* 10.7*  HCT 31.9* 31.4* 34.6*  MCV 94.1 92.1 94.5  PLT 303 358 390   Cardiac Enzymes:  Recent Labs Lab 09/09/13 1750  TROPONINI <0.30   BNP: No components found with this basename: POCBNP,  CBG:  Recent Labs Lab 09/11/13 0654 09/11/13 1158 09/11/13 1650 09/11/13 2143 09/12/13 0653  GLUCAP 88 114* 118* 113* 97    Recent Results (from the past 240 hour(s))  CLOSTRIDIUM DIFFICILE BY PCR     Status: Abnormal   Collection Time    09/10/13 12:56 AM      Result Value Range Status   C difficile by pcr POSITIVE (*) NEGATIVE Final   Comment: CRITICAL RESULT CALLED TO, READ BACK BY AND VERIFIED WITH:     E.ANSOMAA,RN 1036 09/10/13 M.CAMPBELL     Studies:  All Imaging reviewed and is as per above notation   Scheduled Meds: . allopurinol  100 mg Oral BID  . amiodarone  200 mg Oral Daily  . darbepoetin (ARANESP) injection - DIALYSIS  60 mcg Intravenous Q Mon-HD  . doxercalciferol  2 mcg Intravenous Q M,W,F-HD  . feeding supplement (NEPRO CARB STEADY)  237 mL Oral TID BM  . ferric gluconate (FERRLECIT/NULECIT) IV  125 mg Intravenous Q M,W,F-HD  . gabapentin  300 mg Oral BID  . Gerhardt's butt cream   Topical TID  . heparin  5,000 Units Subcutaneous Q8H  . insulin aspart  0-9 Units Subcutaneous TID WC  . ipratropium  2 spray Each Nare TID  . lidocaine  1 patch Transdermal Q24H  . midodrine  5 mg Oral 3 times weekly  .  multivitamin  1 tablet Oral QHS  . multivitamin with minerals  1 tablet Oral Daily  . sevelamer carbonate  800 mg Oral TID WC  . vancomycin  125 mg Oral QID  . zolpidem  5 mg Oral QHS   Continuous Infusions: . sodium chloride 20 mL/hr at 09/10/13 0050     Assessment/Plan:  1. C. difficile-complete 14 days of vancomycin treatment 09/24/13-have discontinued Protonix, senna, MiraLax, MOM, Dulcolax, Colace-Leukocytosis trending downwards-CBC am c Diff 2. Acute L1 #-Per NS. We will continue to assist with medical management. 3. ESRD MWF [facility]-appreciate nephro assist-Keep Vol even.  Dry weight /EDW goal 70KG. -2l c dialysis 1/26 4. P Afib-not on Coumadin.  On Amiodarone 200 daily.  Rate Controlled.  5. Chronic Diastolic HF- [id number] % as of ECHO 12/02/12-Euvolemic.  Dialysis dependant. 6. DM ty 2-SS1 Coverage.  Hold 70/30 for now until decisions about surgery-blood sugars controlled with sliding scale insulin-97--114 7. 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  Maintain volume status carefully-see above discussion 8. 2/2 Hyperparathyroidism-per renal-continue Binders/Calcium supplements 9. AoRD- Aranesp per Renal-Nulecit given 1/25 10. 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 Possible gout-Continue Allopurinol 100 bid  12. History of upper GI bleed-monitor have discontinued Protonix because of C. Difficile-  Code Status: Full Family Communication:  Discussed plan of care with patient's brother [name] on telephone 1/26 Disposition Plan: per NS   [name], MD  [facility] Pager [phone] 09/12/2013, 12:26 PM    LOS: 3 days

## 2013-09-12 NOTE — Progress Notes (Signed)
Subjective:  Seen on dialysis, mild low back pain, no further diarrhea.   Objective: Vital signs in last 24 hours: Temp:  [98.3 F (36.8 C)-98.6 F (37 C)] 98.6 F (37 C) (01/26 0600) Pulse Rate:  [82-86] 83 (01/26 0600) Resp:  [17-18] 18 (01/26 0600) BP: (98-114)/(54-70) 112/70 mmHg (01/26 0600) SpO2:  [93 %-95 %] 93 % (01/26 0600) Weight change:      Lab Results:  Recent Labs  09/10/13 0434 09/12/13 0625  WBC 17.9* 15.3*  HGB 9.8* 10.7*  HCT 31.4* 34.6*  PLT 358 390   BMET:  Recent Labs  09/10/13 0434 09/12/13 0625  NA 134* 134*  K 4.8 4.0  CL 93* 91*  CO2 25 26  GLUCOSE 81 80  BUN 49* 38*  CREATININE 4.93* 4.15*  CALCIUM 8.3* 8.4  ALBUMIN 2.0* 2.1*   No results found for this basename: PTH,  in the last 72 hours Iron Studies: No results found for this basename: IRON, TIBC, TRANSFERRIN, FERRITIN,  in the last 72 hours  EXAM: General appearance:  Alert, in no apparent distress Resp: CTA without rales, rhonchi, or wheezes Cardio: Irregular rhythm without murmur or rub GI:  + BS, soft and nontender Extremities:  No edema Access:  AVF @ LFA with BFR 400 cc/min  Dialysis Orders: Center: MWF on [facility] .  EDW 69kg HD Bath 2K/2.25Ca Time 4:00 Heparin 3000 u. Access L AVF BFR 400 DFR 800  Hectorol 2 mcg IV/HD Epogen 5000 Units (recent increase) IV/HD Venofer 100 mg IV/HD through 1/28  Recent labs: Hgb 10.4, Tsat 15% in December, P 5.0, PTH 296   Assessment/Plan: 1. Unstable L1 fracture/Ankylosing spondylitis - managed non-operatively sec to severe osteopenia, with LSO brace, bed rest per Neurology.  2. C diff colitis - resolving on PO Vancomycin. 3. ESRD - HD on MWF @ [place]; K 4.  HD today. 4. HTN/Volume - BP 112/70 on Midodrine 5 mg pre-HD; wt 72.1 kg with UF goal 2 L. 5. Anemia - Hgb up to 10.7 on Aranesp 60 mcg on Mon, IV Fe starting today. 6. Sec HPT - Ca 8.4 (9.9 corrected), P 2.5; Hectorol 2 mcg, Renvela 1 with meals. 7. Nutrition - Alb 2.1, now regular  diet, vitamin. 8. A-fib - on Amiodarone, no anticoagulation sec to GI bleed 08/4479. 9. CAD/Systolic HF - Hx MI/CABG. 10. DM - Insulin per primary.   LOS: 3 days   Nicholas [name],Nicholas [name] 09/12/2013,8:51 AM  I have seen and examined patient, discussed with PA and agree with assessment and plan as outlined above. Kelly Splinter MD pager [phone]    cell [phone] 09/12/2013, 11:22 AM

## 2013-09-12 NOTE — Progress Notes (Signed)
Patient has not voided during the shift. Bladder scan at 2345 was zero and Bladder scan at 0650 was 86 ml.

## 2013-09-12 NOTE — Progress Notes (Signed)
PT Cancellation Note  Patient Details Name: Nicholas [name] MRN: [id number] DOB: [birth year]   Cancelled Treatment:    Reason Eval/Treat Not Completed: Medical issues which prohibited therapy.  Per Neurosurgery notes, patient on strict bedrest.  However hospitalist orders for ambulation.  Will hold PT today.  MD:  Please clarify OOB orders - when appropriate to begin mobility.  Thank you!   Despina Pole 09/12/2013, 2:09 PM

## 2013-09-12 NOTE — Progress Notes (Signed)
OT Cancellation Note  Patient Details Name: Nicholas [name] MRN: [id number] DOB: Dec 31, 1940   Cancelled Treatment:    Reason Eval/Treat Not Completed: Medical issues which prohibited therapy. Per Neurosurgery notes, patient on strict bedrest. However hospitalist orders for ambulation. Will hold OT today. MD: Please clarify OOB orders - when appropriate to begin mobility. Thank you!   Benito Mccreedy OTR/L [phone] 09/12/2013, 3:42 PM

## 2013-09-13 LAB — GLUCOSE, CAPILLARY
GLUCOSE-CAPILLARY: 110 mg/dL — AB (ref 70–99)
GLUCOSE-CAPILLARY: 116 mg/dL — AB (ref 70–99)
Glucose-Capillary: 154 mg/dL — ABNORMAL HIGH (ref 70–99)
Glucose-Capillary: 189 mg/dL — ABNORMAL HIGH (ref 70–99)

## 2013-09-13 LAB — COMPREHENSIVE METABOLIC PANEL
AST: 11 U/L (ref 0–37)
Albumin: 1.9 g/dL — ABNORMAL LOW (ref 3.5–5.2)
Alkaline Phosphatase: 151 U/L — ABNORMAL HIGH (ref 39–117)
BILIRUBIN TOTAL: 0.3 mg/dL (ref 0.3–1.2)
BUN: 22 mg/dL (ref 6–23)
CO2: 27 meq/L (ref 19–32)
Calcium: 8 mg/dL — ABNORMAL LOW (ref 8.4–10.5)
Chloride: 94 mEq/L — ABNORMAL LOW (ref 96–112)
Creatinine, Ser: 3.13 mg/dL — ABNORMAL HIGH (ref 0.50–1.35)
GFR calc Af Amer: 22 mL/min — ABNORMAL LOW (ref >90)
GFR, EST NON AFRICAN AMERICAN: 19 mL/min — AB (ref >90)
Glucose, Bld: 105 mg/dL — ABNORMAL HIGH (ref 70–99)
POTASSIUM: 3.5 meq/L — AB (ref 3.7–5.3)
SODIUM: 138 meq/L (ref 137–147)
Total Protein: 5.8 g/dL — ABNORMAL LOW (ref 6.0–8.3)

## 2013-09-13 LAB — CBC WITH DIFFERENTIAL/PLATELET
Basophils Absolute: 0 10*3/uL (ref 0.0–0.1)
Basophils Relative: 0 % (ref 0–1)
Eosinophils Absolute: 0 10*3/uL (ref 0.0–0.7)
Eosinophils Relative: 0 % (ref 0–5)
HEMATOCRIT: 32.8 % — AB (ref 39.0–52.0)
Hemoglobin: 10.2 g/dL — ABNORMAL LOW (ref 13.0–17.0)
LYMPHS PCT: 6 % — AB (ref 12–46)
Lymphs Abs: 1.1 10*3/uL (ref 0.7–4.0)
MCH: 29.2 pg (ref 26.0–34.0)
MCHC: 31.1 g/dL (ref 30.0–36.0)
MCV: 94 fL (ref 78.0–100.0)
Monocytes Absolute: 1.3 10*3/uL — ABNORMAL HIGH (ref 0.1–1.0)
Monocytes Relative: 7 % (ref 3–12)
NEUTROS ABS: 17.2 10*3/uL — AB (ref 1.7–7.7)
Neutrophils Relative %: 88 % — ABNORMAL HIGH (ref 43–77)
PLATELETS: 376 10*3/uL (ref 150–400)
RBC: 3.49 MIL/uL — ABNORMAL LOW (ref 4.22–5.81)
RDW: 17.2 % — ABNORMAL HIGH (ref 11.5–15.5)
WBC: 19.7 10*3/uL — AB (ref 4.0–10.5)

## 2013-09-13 NOTE — Progress Notes (Signed)
Subjective:  Currently uncomfortable with low back pain, needs to be repositioned in bed, no dyspnea  Objective: Vital signs in last 24 hours: Temp:  [98 F (36.7 C)-99 F (37.2 C)] 98 F (36.7 C) (01/27 0600) Pulse Rate:  [74-103] 93 (01/27 0600) Resp:  [11-20] 20 (01/27 0600) BP: (108-146)/(36-90) 137/70 mmHg (01/27 0600) SpO2:  [90 %-97 %] 97 % (01/27 0600) Weight:  [70.2 kg (154 lb 12.2 oz)] 70.2 kg (154 lb 12.2 oz) (01/26 1223) Weight change:   Intake/Output from previous day: 01/26 0701 - 01/27 0700 In: -  Out: 1900    Lab Results:  Recent Labs  09/12/13 0625 09/13/13 0640  WBC 15.3* 19.7*  HGB 10.7* 10.2*  HCT 34.6* 32.8*  PLT 390 376   BMET:  Recent Labs  09/12/13 0625 09/13/13 0640  NA 134* 138  K 4.0 3.5*  CL 91* 94*  CO2 26 27  GLUCOSE 80 105*  BUN 38* 22  CREATININE 4.15* 3.13*  CALCIUM 8.4 8.0*  ALBUMIN 2.1* 1.9*   No results found for this basename: PTH,  in the last 72 hours Iron Studies: No results found for this basename: IRON, TIBC, TRANSFERRIN, FERRITIN,  in the last 72 hours  EXAM:  General appearance: Alert, in no apparent distress  Resp: CTA without rales, rhonchi, or wheezes  Cardio: Irregular rhythm without murmur or rub  GI: + BS, soft and nontender  Extremities: No edema  Access: AVF @ LFA with BFR 400 cc/min   Dialysis: MWF [place]  4h   69kg    2K/2.25 Bath   Heparin 3000    LFA AVF Hectorol 2   Epogen 5000  Venofer 100 mg IV/HD through 1/28  Recent labs: Hgb 10.4, Tsat 15% in December, P 5.0, PTH 296   Assessment/Plan: 1. Unstable L1 fracture/Ankylosing spondylitis - managed non-operatively sec to severe osteopenia, with LSO brace, bed rest per Neurosurgery.  2. C diff colitis - resolved on PO Vancomycin.  3. ESRD - HD on MWF @ [place]; K 3.5.  Next HD tomorrow.  4. HTN/Volume - BP 137/70 on Midodrine 5 mg pre-HD; wt 70.2 kg s/p net UF 1.9 L yesterday; over EDW, but wearing LSO brace. 5. Anemia - Hgb 10.2 on Aranesp 60 mcg on  Mon, IV Fe (last dose of series tomorrow).  6. Sec HPT - Ca 8 (9.7 corrected), P 2.5; Hectorol 2 mcg, Renvela 1 with meals.  7. Nutrition - Alb 1.9, now regular diet, vitamin.  8. A-fib - on Amiodarone, no anticoagulation sec to GI bleed 11/7827.  9. CAD/Systolic HF - Hx MI/CABG.  10. DM - Insulin per primary.     [name]: 4 days   LYLES,Nicholas 09/13/2013,9:53 AM  I have seen and examined patient, discussed with PA and agree with assessment and plan as outlined above. Kelly Splinter MD pager [phone]    cell [phone] 09/13/2013, 11:32 AM

## 2013-09-13 NOTE — Progress Notes (Addendum)
MED CONSULT NOTE  Note: This document was prepared with digital dictation and possible smart phrase technology. Any transcriptional errors that result from this process are unintentional.   Nicholas [name] NID:[id number] DOB: 11/13/1940 DOA: 09/09/2013 PCP: Ramonita Lab  Brief narrative: 73 Y/O ?, known ESRD MWF HD [facility] 10/19/12 admit], recent admission 12/20 c Fall+syncope, episodes of admission for Acute/chron Resp falure, s/p CABGx5+ RCEA,  02/03/13, documented L hemidiaphragm paralysis,  EF 45-50% on Cardiac cath 01/11/13 [NSTEMI 10/19/12-P AFib CHad2Vasc2 score~3-4 not on AC due to GI bleed 09/17/12-[facility]-no colonoscopy] Admitted 09/09/13 with acute worsening of Lower back pain after Xmas fall. MRI back1/23=acute # L1 body/Post element [CHance#].  Found to have "colitis"/"tumour" on CT Lumbar spine- C. difficile came back as +1/24-Flagyl transitioned to by mouth vancomycin  Past medical history-As per Problem list Chart reviewed as below-   Consultants:  Renal  Procedures:  Dialysed 1/24-hypotensive slighlty  Antibiotics:  Flagyll 1/23-1/24  Vancomycin 1/24   Subjective  doing okay Tol diet. NO CP Nt ambualtory as yet Has decided against any NS Diarrheoa completely has stopped       Objective    Interim History: none  Telemetry: placed on tele  Objective: Filed Vitals:   09/12/13 2030 09/13/13 0120 09/13/13 0600 09/13/13 1100  BP: 146/73 120/63 137/70 105/58  Pulse: 96 93 93 93  Temp: 98.5 F (36.9 C) 99 F (37.2 C) 98 F (36.7 C) 98.2 F (36.8 C)  TempSrc: Oral Oral Oral Oral  Resp: 20 18 20 20   Height:      Weight:      SpO2: 90% 95% 97% 96%    Intake/Output Summary (Last 24 hours) at 09/13/13 1623 Last data filed at 09/13/13 1407  Gross per 24 hour  Intake    240 ml  Output      0 ml  Net    240 ml    Exam:  Cardiovascular: EOMI, frail, Body mass index is 24.99 kg/(m^2). Respiratory: s1 s2 no m/r/g Abdomen: soft, NT,  ND Skinno LE edema, Midline chest scar Neuro intact.  Have not assesed ROM 2/2 to pain  Data Reviewed: Basic Metabolic Panel:  Recent Labs Lab 09/09/13 1328 09/10/13 0434 09/12/13 0625 09/13/13 0640  NA 138 134* 134* 138  K 4.4 4.8 4.0 3.5*  CL 94* 93* 91* 94*  CO2 29 25 26 27   GLUCOSE 91 81 80 105*  BUN 41* 49* 38* 22  CREATININE 4.45* 4.93* 4.15* 3.13*  CALCIUM 8.4 8.3* 8.4 8.0*  PHOS  --  3.7 2.5  --    Liver Function Tests:  Recent Labs Lab 09/10/13 0434 09/12/13 0625 09/13/13 0640  AST  --   --  11  ALT  --   --  <5  ALKPHOS  --   --  151*  BILITOT  --   --  0.3  PROT  --   --  5.8*  ALBUMIN 2.0* 2.1* 1.9*   No results found for this basename: LIPASE, AMYLASE,  in the last 168 hours No results found for this basename: AMMONIA,  in the last 168 hours CBC:  Recent Labs Lab 09/09/13 1328 09/10/13 0434 09/12/13 0625 09/13/13 0640  WBC 17.0* 17.9* 15.3* 19.7*  NEUTROABS 15.0*  --  12.2* 17.2*  HGB 10.0* 9.8* 10.7* 10.2*  HCT 31.9* 31.4* 34.6* 32.8*  MCV 94.1 92.1 94.5 94.0  PLT 303 358 390 376   Cardiac Enzymes:  Recent Labs Lab 09/09/13 1750  TROPONINI <  0.30   BNP: No components found with this basename: POCBNP,  CBG:  Recent Labs Lab 09/12/13 1345 09/12/13 1630 09/12/13 2210 09/13/13 0652 09/13/13 1122  GLUCAP 105* 118* 96 110* 154*    Recent Results (from the past 240 hour(s))  CLOSTRIDIUM DIFFICILE BY PCR     Status: Abnormal   Collection Time    09/10/13 12:56 AM      Result Value Range Status   C difficile by pcr POSITIVE (*) NEGATIVE Final   Comment: CRITICAL RESULT CALLED TO, READ BACK BY AND VERIFIED WITH:     E.ANSOMAA,RN 1036 09/10/13 M.CAMPBELL     Studies:              All Imaging reviewed and is as per above notation   Scheduled Meds: . allopurinol  100 mg Oral BID  . amiodarone  200 mg Oral Daily  . darbepoetin (ARANESP) injection - DIALYSIS  60 mcg Intravenous Q Mon-HD  . doxercalciferol  2 mcg Intravenous Q  M,W,F-HD  . feeding supplement (NEPRO CARB STEADY)  237 mL Oral TID BM  . ferric gluconate (FERRLECIT/NULECIT) IV  125 mg Intravenous Q M,W,F-HD  . gabapentin  300 mg Oral BID  . Gerhardt's butt cream   Topical TID  . heparin  5,000 Units Subcutaneous Q8H  . insulin aspart  0-9 Units Subcutaneous TID WC  . ipratropium  2 spray Each Nare TID  . lidocaine  1 patch Transdermal Q24H  . midodrine  5 mg Oral 3 times weekly  . multivitamin  1 tablet Oral QHS  . multivitamin with minerals  1 tablet Oral Daily  . sevelamer carbonate  800 mg Oral TID WC  . vancomycin  125 mg Oral QID  . zolpidem  5 mg Oral QHS   Continuous Infusions: . sodium chloride 20 mL/hr at 09/10/13 0050     Assessment/Plan:  1. C. difficile-complete 14 days of vancomycin treatment 09/24/13-have discontinued Protonix, senna, MiraLax, MOM, Dulcolax, Colace-Leukocytosis trending downwards.  Monitor clinically 2. Acute L1 #-Per NS. We will continue to assist with medical management. 3. ESRD MWF [facility]-appreciate nephro assist-Keep Vol even.  Dry weight /EDW goal 70KG. -2l c dialysis 1/26 4. P Afib-not on Coumadin.  On Amiodarone 200 daily.  Rate Controlled.  5. Chronic Diastolic HF- [id number] % as of ECHO 12/02/12-Euvolemic.  Dialysis dependant. 6. DM ty 2-SS1 Coverage.  Hold 70/30 for now until decisions about surgery-blood sugars controlled with sliding scale insulin-97-154 7. 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  Maintain volume status carefully-see above discussion 8. 2/2 Hyperparathyroidism-per renal-continue Binders/Calcium supplements 9. AoRD- Aranesp per Renal-Nulecit given 1/25 10. 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 Possible gout-Continue Allopurinol 100 bid  12. History of upper GI bleed-monitor have discontinued Protonix because of C. Difficile-  Code Status: Full Family Communication:  Discussed plan of care with patient's brother [name] on telephone 1/26  Disposition Plan: per  NS-=-they will determine when patient can sefely mobilize.  Patient needs SNF per their discretion   [name], MD  [facility] Pager [phone] 09/13/2013, 4:23 PM    LOS: 4 days

## 2013-09-13 NOTE — Progress Notes (Signed)
No issues overnight. Pt does c/o some back pain, but no c/o weakness.  EXAM:  BP 105/58  Pulse 93  Temp(Src) 98.2 F (36.8 C) (Oral)  Resp 20  Ht 5\' 6"  (1.676 m)  Wt 70.2 kg (154 lb 12.2 oz)  BMI 24.99 kg/m2  SpO2 96%  Awake, alert, oriented  Speech fluent, appropriate  CN grossly intact  5/5 BUE/BLE  Brace in place  IMPRESSION:  73 y.o. male with L1 chance fracture, stable  PLAN: - Cont bedrest with brace for now, will attempt to mobilize later this week. - Cont dialysis, medical and nephrology assistance appreciated.  I again spoke with the patient regarding the risks of surgical stabilization as well as the risks of non-operative management. I told him the worst case scenario of non-operative management is progression of fracture/deformity with spinal cord injury which could potentially be complete. The risks of surgery and perioperative complications were also again discussed. The patient understands that this is a difficult situation and is in agreement with the conservative management strategy.

## 2013-09-14 DIAGNOSIS — A0472 Enterocolitis due to Clostridium difficile, not specified as recurrent: Secondary | ICD-10-CM

## 2013-09-14 LAB — GLUCOSE, CAPILLARY
GLUCOSE-CAPILLARY: 132 mg/dL — AB (ref 70–99)
GLUCOSE-CAPILLARY: 119 mg/dL — AB (ref 70–99)
Glucose-Capillary: 123 mg/dL — ABNORMAL HIGH (ref 70–99)
Glucose-Capillary: 142 mg/dL — ABNORMAL HIGH (ref 70–99)

## 2013-09-14 LAB — CBC
HCT: 29.8 % — ABNORMAL LOW (ref 39.0–52.0)
HEMOGLOBIN: 9.3 g/dL — AB (ref 13.0–17.0)
MCH: 29.2 pg (ref 26.0–34.0)
MCHC: 31.2 g/dL (ref 30.0–36.0)
MCV: 93.4 fL (ref 78.0–100.0)
Platelets: 386 10*3/uL (ref 150–400)
RBC: 3.19 MIL/uL — ABNORMAL LOW (ref 4.22–5.81)
RDW: 17.1 % — ABNORMAL HIGH (ref 11.5–15.5)
WBC: 23 10*3/uL — ABNORMAL HIGH (ref 4.0–10.5)

## 2013-09-14 LAB — RENAL FUNCTION PANEL
Albumin: 1.8 g/dL — ABNORMAL LOW (ref 3.5–5.2)
BUN: 36 mg/dL — ABNORMAL HIGH (ref 6–23)
CHLORIDE: 94 meq/L — AB (ref 96–112)
CO2: 28 meq/L (ref 19–32)
CREATININE: 4.35 mg/dL — AB (ref 0.50–1.35)
Calcium: 7.8 mg/dL — ABNORMAL LOW (ref 8.4–10.5)
GFR calc Af Amer: 15 mL/min — ABNORMAL LOW (ref >90)
GFR calc non Af Amer: 13 mL/min — ABNORMAL LOW (ref >90)
Glucose, Bld: 140 mg/dL — ABNORMAL HIGH (ref 70–99)
POTASSIUM: 3.8 meq/L (ref 3.7–5.3)
Phosphorus: 2.6 mg/dL (ref 2.3–4.6)
Sodium: 136 mEq/L — ABNORMAL LOW (ref 137–147)

## 2013-09-14 MED ORDER — DOXERCALCIFEROL 4 MCG/2ML IV SOLN
INTRAVENOUS | Status: AC
  Filled 2013-09-14: qty 2

## 2013-09-14 MED ORDER — SIMETHICONE 80 MG PO CHEW
160.0000 mg | CHEWABLE_TABLET | Freq: Four times a day (QID) | ORAL | Status: DC | PRN
  Administered 2013-09-14 – 2013-09-20 (×8): 160 mg via ORAL
  Filled 2013-09-14 (×8): qty 2

## 2013-09-14 MED ORDER — OXYCODONE HCL 5 MG PO TABS
ORAL_TABLET | ORAL | Status: AC
Start: 2013-09-14 — End: 2013-09-15
  Filled 2013-09-14: qty 3

## 2013-09-14 NOTE — Procedures (Signed)
I was present at this dialysis session, have reviewed the session itself and made  appropriate changes  Kelly Splinter MD (pgr) [phone]    ([phone] 09/14/2013, 11:50 AM

## 2013-09-14 NOTE — Progress Notes (Signed)
[facility] PROGRESS NOTE  Nicholas [name] HER:[id number] DOB: 07/29/1941 DOA: 09/09/2013 PCP: Ramonita Lab  Assessment/Plan: C. difficile-complete 14 days of vancomycin treatment 09/24/13-have discontinued Protonix, senna, MiraLax, MOM, Dulcolax, Colace  Leukocytosis: monitor.   Acute L1 fracture-Per NS.   ESRD MWF [facility]-appreciate nephro assist-Keep Vol even. Dry weight /EDW goal 70KG. -2l c dialysis 1/26 P   Afib-not on Coumadin. On Amiodarone 200 daily. Rate Controlled.   Chronic Diastolic HF- EHUD14 % as of ECHO 12/02/12-Euvolemic. Dialysis dependant.   DM  2-SS1 Coverage.   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 Difficile-   Code Status: full Family Communication: patient Disposition Plan: per primary    HPI/Subjective: Does not feel like talking today- his back hurts  Objective: Filed Vitals:   09/14/13 1256  BP: 99/57  Pulse: 96  Temp:   Resp: 18    Intake/Output Summary (Last 24 hours) at 09/14/13 1305 Last data filed at 09/14/13 1209  Gross per 24 hour  Intake    600 ml  Output   2000 ml  Net  -1400 ml   Filed Weights   09/12/13 1223 09/14/13 0758 09/14/13 1209  Weight: 70.2 kg (154 lb 12.2 oz) 70.8 kg (156 lb 1.4 oz) 68.3 kg (150 lb 9.2 oz)    Exam:  Cardiovascular: EOMI, frail Respiratory: s1 s2 no m/r/g  Abdomen: soft, NT, ND  Skin:  no LE edema, Midline chest scar  Neuro intact. Have not assesed ROM 2/2 to pain   Data Reviewed: Basic Metabolic Panel:  Recent Labs Lab 09/09/13 1328 09/10/13 0434 09/12/13 0625 09/13/13 0640 09/14/13 0500  NA 138 134* 134* 138 136*  K  4.4 4.8 4.0 3.5* 3.8  CL 94* 93* 91* 94* 94*  CO2 29 25 26 27 28   GLUCOSE 91 81 80 105* 140*  BUN 41* 49* 38* 22 36*  CREATININE 4.45* 4.93* 4.15* 3.13* 4.35*  CALCIUM 8.4 8.3* 8.4 8.0* 7.8*  PHOS  --  3.7 2.5  --  2.6   Liver Function Tests:  Recent Labs Lab 09/10/13 0434 09/12/13 0625 09/13/13 0640 09/14/13 0500  AST  --   --  11  --   ALT  --   --  <5  --   ALKPHOS  --   --  151*  --   BILITOT  --   --  0.3  --   PROT  --   --  5.8*  --   ALBUMIN 2.0* 2.1* 1.9* 1.8*   No results found for this basename: LIPASE, AMYLASE,  in the last 168 hours No results found for this basename: AMMONIA,  in the last 168 hours CBC:  Recent Labs Lab 09/09/13 1328 09/10/13 0434 09/12/13 0625 09/13/13 0640 09/14/13 0500  WBC 17.0* 17.9* 15.3* 19.7* 23.0*  NEUTROABS 15.0*  --  12.2* 17.2*  --   HGB 10.0* 9.8* 10.7* 10.2* 9.3*  HCT 31.9* 31.4* 34.6* 32.8* 29.8*  MCV 94.1 92.1 94.5 94.0 93.4  PLT 303 358 390 376 386   Cardiac Enzymes:  Recent Labs Lab 09/09/13 1750  TROPONINI <0.30   BNP (last 3 results)  Recent Labs  10/20/12 0320  PROBNP 38564.0*   CBG:  Recent Labs  Lab 09/13/13 0652 09/13/13 1122 09/13/13 1614 09/13/13 2117 09/14/13 0625  GLUCAP 110* 154* 116* 189* 132*    Recent Results (from the past 240 hour(s))  CLOSTRIDIUM DIFFICILE BY PCR     Status: Abnormal   Collection Time    09/10/13 12:56 AM      Result Value Range Status   C difficile by pcr POSITIVE (*) NEGATIVE Final   Comment: CRITICAL RESULT CALLED TO, READ BACK BY AND VERIFIED WITH:     E.ANSOMAA,RN 1036 09/10/13 M.CAMPBELL     Studies: No results found.  Scheduled Meds: . allopurinol  100 mg Oral BID  . amiodarone  200 mg Oral Daily  . darbepoetin (ARANESP) injection - DIALYSIS  60 mcg Intravenous Q Mon-HD  . doxercalciferol  2 mcg Intravenous Q M,W,F-HD  . feeding supplement (NEPRO CARB STEADY)  237 mL Oral TID BM  . gabapentin  300 mg Oral BID  . Gerhardt's butt cream    Topical TID  . heparin  5,000 Units Subcutaneous Q8H  . insulin aspart  0-9 Units Subcutaneous TID WC  . ipratropium  2 spray Each Nare TID  . lidocaine  1 patch Transdermal Q24H  . midodrine  5 mg Oral 3 times weekly  . multivitamin  1 tablet Oral QHS  . multivitamin with minerals  1 tablet Oral Daily  . oxyCODONE      . sevelamer carbonate  800 mg Oral TID WC  . vancomycin  125 mg Oral QID  . zolpidem  5 mg Oral QHS   Continuous Infusions: . sodium chloride 20 mL/hr at 09/10/13 0050    Active Problems:   ESRD (end stage renal disease) on dialysis   DM (diabetes mellitus)   CAD (coronary artery disease)   S/P CABG x 5   Systolic CHF   Leukocytosis   L1 vertebral fracture   Protein-calorie malnutrition, severe    Time spent: 35 min    Johara Lodwick  [facility] Pager [phone]. If 7PM-7AM, please contact night-coverage at [email or web address], password [facility] 09/14/2013, 1:05 PM  LOS: 5 days

## 2013-09-14 NOTE — Progress Notes (Signed)
Subjective:  Seen on dialysis, frequent low back pain, but currently comfortable, no dyspnea  Objective: Vital signs in last 24 hours: Temp:  [98.1 F (36.7 C)-98.8 F (37.1 C)] 98.7 F (37.1 C) (01/28 0758) Pulse Rate:  [84-115] 84 (01/28 0830) Resp:  [15-22] 21 (01/28 0830) BP: (104-134)/(58-71) 111/71 mmHg (01/28 0830) SpO2:  [94 %-97 %] 94 % (01/28 0758) Weight:  [70.8 kg (156 lb 1.4 oz)] 70.8 kg (156 lb 1.4 oz) (01/28 0758) Weight change:   Intake/Output from previous day: 01/27 0701 - 01/28 0700 In: 600 [P.O.:600] Out: -    Lab Results:  Recent Labs  09/13/13 0640 09/14/13 0500  WBC 19.7* 23.0*  HGB 10.2* 9.3*  HCT 32.8* 29.8*  PLT 376 386   BMET:  Recent Labs  09/12/13 0625 09/13/13 0640  NA 134* 138  K 4.0 3.5*  CL 91* 94*  CO2 26 27  GLUCOSE 80 105*  BUN 38* 22  CREATININE 4.15* 3.13*  CALCIUM 8.4 8.0*  ALBUMIN 2.1* 1.9*   No results found for this basename: PTH,  in the last 72 hours Iron Studies: No results found for this basename: IRON, TIBC, TRANSFERRIN, FERRITIN,  in the last 72 hours  EXAM:  General appearance: Alert, in no apparent distress  Resp: CTA without rales, rhonchi, or wheezes  Cardio: Irregular rhythm without murmur or rub  GI: + BS, soft and nontender  Extremities: No edema  Access: AVF @ LFA with BFR 400 cc/min   Dialysis: MWF [facility]  4h 69kg 2K/2.25 [place] Heparin 3000 LFA AVF  Hectorol 2 Epogen 5000 Venofer 100 mg IV/HD through 1/28  Recent labs: Hgb 10.4, Tsat 15% in December, P 5.0, PTH 296   Assessment/Plan: 1. Unstable L1 fracture/Ankylosing spondylitis - managed non-operatively sec to severe osteopenia, with LSO brace, bed rest per Neurosurgery.  2. C diff colitis - resolved on PO Vancomycin.  3. ESRD - HD on MWF @ [place]; K 3.5.  HD today.  4. HTN/Volume - BP 111/71 on Midodrine 5 mg pre-HD; wt 70.8 kg with UF goal of 2 L; over EDW, but wearing LSO brace.  5. Anemia - Hgb 10.2 on Aranesp 60 mcg on Mon, IV Fe (last dose  of series today).  6. Sec HPT - Ca 8 (9.7 corrected), P 2.5; Hectorol 2 mcg, Renvela 1 with meals.  7. Nutrition - Alb 1.9, now regular diet, vitamin.  8. A-fib - on Amiodarone, no anticoagulation sec to GI bleed 08/4429.  9. CAD/Systolic HF - Hx MI/CABG.  10. DM - Insulin per primary     LOS: 5 days   LYLES,CHARLES 09/14/2013,8:53 AM   I have seen and examined patient, discussed with PA and agree with assessment and plan as outlined above. Kelly Splinter MD pager [phone]    cell [phone] 09/14/2013, 11:49 AM

## 2013-09-15 LAB — GLUCOSE, CAPILLARY
GLUCOSE-CAPILLARY: 147 mg/dL — AB (ref 70–99)
GLUCOSE-CAPILLARY: 183 mg/dL — AB (ref 70–99)
Glucose-Capillary: 204 mg/dL — ABNORMAL HIGH (ref 70–99)
Glucose-Capillary: 136 mg/dL — ABNORMAL HIGH (ref 70–99)

## 2013-09-15 MED ORDER — NEPRO/CARBSTEADY PO LIQD
237.0000 mL | Freq: Two times a day (BID) | ORAL | Status: DC
  Administered 2013-09-15 – 2013-09-16 (×2): 237 mL via ORAL
  Filled 2013-09-15 (×13): qty 237

## 2013-09-15 MED ORDER — BOOST / RESOURCE BREEZE PO LIQD
1.0000 | ORAL | Status: DC
  Administered 2013-09-16 – 2013-09-20 (×3): 1 via ORAL

## 2013-09-15 NOTE — Progress Notes (Signed)
[facility] Progress Note   Subjective: C/o back pain, ;poor vision when standing  Filed Vitals:   09/14/13 1815 09/14/13 2037 09/14/13 2300 09/15/13 0622  BP: 100/54 95/56 108/62 97/55  Pulse: 93 86 86 90  Temp: 99.2 F (37.3 C) 98.5 F (36.9 C) 99.3 F (37.4 C) 99.4 F (37.4 C)  TempSrc: Oral Oral  Oral  Resp: 18 18 18 26   Height:      Weight:      SpO2: 96% 94% 94% 98%  EXAM:  General appearance: Alert, in no apparent distress  Resp: CTA without rales, rhonchi, or wheezes  Cardio: Irregular rhythm without murmur or rub  GI: + BS, soft and nontender  Extremities: No edema  Access: AVF @ LFA with BFR 400 cc/min   Dialysis: MWF [place]  4h   69kg    2K/2.25 [place]   Heparin 3000    LFA AVF Hectorol 2   Epogen 5000  Venofer 100 mg IV/HD through 1/28  Recent labs: Hgb 10.4, Tsat 15% in December, P 5.0, PTH 296   Assessment/Plan: 1. Unstable L1 fracture/Ankylosing spondylitis - managed non-operatively sec to severe osteopenia, with LSO brace and bed rest, per NSurg 2. C diff colitis - resolved on PO Vancomycin.  3. ESRD, HD tomorrow 4. HTN/Volume- BP's low on midodrine, below dry wt, having symptoms with standing, no vol excess on exam. Plan keep even w HD 5. Anemia - Hgb 10.2 on Aranesp 60 mcg on Mon, IV Fe (last dose of series tomorrow).  6. Sec HPT - Ca 8 (9.7 corrected), P 2.5; Hectorol 2 mcg, Renvela 1 with meals.  7. Nutrition - Alb 1.9, now regular diet, vitamin.  8. A-fib - on Amiodarone, no anticoagulation sec to GI bleed 09/5364.  9. CAD/Systolic HF - Hx MI/CABG.  10. DM - Insulin per primary.    Kelly Splinter MD pager [phone]    cell [phone] 09/15/2013, 9:58 AM   Recent Labs Lab 09/10/13 0434 09/12/13 0625 09/13/13 0640 09/14/13 0500  NA 134* 134* 138 136*  K 4.8 4.0 3.5* 3.8  CL 93* 91* 94* 94*  CO2 25 26 27 28   GLUCOSE 81 80 105* 140*  BUN 49* 38* 22 36*  CREATININE 4.93* 4.15* 3.13* 4.35*  CALCIUM 8.3* 8.4 8.0* 7.8*  PHOS 3.7  2.5  --  2.6    Recent Labs Lab 09/12/13 0625 09/13/13 0640 09/14/13 0500  AST  --  11  --   ALT  --  <5  --   ALKPHOS  --  151*  --   BILITOT  --  0.3  --   PROT  --  5.8*  --   ALBUMIN 2.1* 1.9* 1.8*    Recent Labs Lab 09/09/13 1328  09/12/13 0625 09/13/13 0640 09/14/13 0500  WBC 17.0*  < > 15.3* 19.7* 23.0*  NEUTROABS 15.0*  --  12.2* 17.2*  --   HGB 10.0*  < > 10.7* 10.2* 9.3*  HCT 31.9*  < > 34.6* 32.8* 29.8*  MCV 94.1  < > 94.5 94.0 93.4  PLT 303  < > 390 376 386  < > = values in this interval not displayed. [name] allopurinol  100 mg Oral BID  . amiodarone  200 mg Oral Daily  . darbepoetin (ARANESP) injection - DIALYSIS  60 mcg Intravenous Q Mon-HD  . doxercalciferol  2 mcg Intravenous Q M,W,F-HD  . feeding supplement (NEPRO CARB STEADY)  237 mL Oral TID BM  . gabapentin  300 mg Oral BID  . Gerhardt's butt cream   Topical TID  . heparin  5,000 Units Subcutaneous Q8H  . insulin aspart  0-9 Units Subcutaneous TID WC  . ipratropium  2 spray Each Nare TID  . lidocaine  1 patch Transdermal Q24H  . midodrine  5 mg Oral 3 times weekly  . multivitamin  1 tablet Oral QHS  . multivitamin with minerals  1 tablet Oral Daily  . sevelamer carbonate  800 mg Oral TID WC  . vancomycin  125 mg Oral QID  . zolpidem  5 mg Oral QHS   . sodium chloride 20 mL/hr at 09/10/13 0050   acetaminophen, midodrine, morphine injection, oxyCODONE, promethazine, simethicone, traMADol-acetaminophen

## 2013-09-15 NOTE — Progress Notes (Signed)
NUTRITION FOLLOW UP  Intervention:   Provide Nepro Shake po BID, each supplement provides 425 kcal and 19 grams protein Provide Resource Breeze po once daily, each supplement provides 250 kcal and 9 grams of protein Provide snacks BID   Nutrition Dx:   Inadequate oral intake related to poor appetite as evidenced by 9% weight loss in the past 3 months.    Goal:   Pt to meet >/= 90% of their estimated nutrition needs; not likely met  Monitor:   PO intake, labs, weight trends  Assessment:   Patient is a 73 y.o. male who presents to the emergency department brought in by EMS for persistent back pain after a fall approximately one week prior to Christmas.  Pt diet was liberalized from Renal 60/70 to Low Potassium on 1/25. Per RN pt has been very lethargic and nauseous at times (moslty after dialysis) causing poor food intake but, when pt feels well he eats well. Nepro shakes are ordered TID. Pt reports only drinking about one Nepro Shake daily due to nausea. Pt agreeable to trying [employer] and eating snacks in between meals.   Height: Ht Readings from Last 1 Encounters:  09/09/13 _0  (1.676 m)    Weight Status:   Wt Readings from Last 1 Encounters:  09/14/13 150 lb 9.2 oz (68.3 kg)    Re-estimated needs:  Kcal: 2000-2400  Protein: 83-97 gm  Fluid: 1.2 L  Skin: dry, intact  Diet Order: low potassium   Intake/Output Summary (Last 24 hours) at 09/15/13 1249 Last data filed at 09/15/13 1226  Gross per 24 hour  Intake    340 ml  Output      0 ml  Net    340 ml    Last BM: 1/29   Labs:   Recent Labs Lab 09/10/13 0434 09/12/13 0625 09/13/13 0640 09/14/13 0500  NA 134* 134* 138 136*  K 4.8 4.0 3.5* 3.8  CL 93* 91* 94* 94*  CO2 _1 BUN 49* 38* 22 36*  CREATININE 4.93* 4.15* 3.13* 4.35*  CALCIUM 8.3* 8.4 8.0* 7.8*  PHOS 3.7 2.5  --  2.6  GLUCOSE 81 80 105* 140*    CBG (last 3)   Recent Labs  09/14/13 2136 09/15/13 0737 09/15/13 1148   GLUCAP 119* 147* 204*    Scheduled Meds: . allopurinol  100 mg Oral BID  . amiodarone  200 mg Oral Daily  . darbepoetin (ARANESP) injection - DIALYSIS  60 mcg Intravenous Q Mon-HD  . doxercalciferol  2 mcg Intravenous Q M,W,F-HD  . feeding supplement (NEPRO CARB STEADY)  237 mL Oral TID BM  . gabapentin  300 mg Oral BID  . Gerhardt's butt cream   Topical TID  . heparin  5,000 Units Subcutaneous Q8H  . insulin aspart  0-9 Units Subcutaneous TID WC  . ipratropium  2 spray Each Nare TID  . lidocaine  1 patch Transdermal Q24H  . midodrine  5 mg Oral 3 times weekly  . multivitamin  1 tablet Oral QHS  . multivitamin with minerals  1 tablet Oral Daily  . sevelamer carbonate  800 mg Oral TID WC  . vancomycin  125 mg Oral QID  . zolpidem  5 mg Oral QHS    Continuous Infusions: . sodium chloride 20 mL/hr at 09/10/13 0050    Pryor Ochoa RD, LDN Inpatient Clinical Dietitian Pager: [phone] After Hours Pager: [phone]

## 2013-09-15 NOTE — Progress Notes (Signed)
No issues overnight. Pt denies weakness.  EXAM:  BP 111/66  Pulse 93  Temp(Src) 98 F (36.7 C) (Oral)  Resp 22  Ht 5\' 6"  (1.676 m)  Wt 68.3 kg (150 lb 9.2 oz)  BMI 24.31 kg/m2  SpO2 95%  Awake, alert, oriented  Speech fluent, appropriate  CN grossly intact  5/5 BUE/BLE  Brace in place  IMPRESSION:  73 y.o. male with L1 chance fracture, stable  PLAN: - Will attempt to mobilize with PT/OT tomorrow. - Cont dialysis, with medical and nephrology assistance

## 2013-09-15 NOTE — Plan of Care (Signed)
Pt states right elbow is hurting. Would like something wrapped around it. kerlex wrapped and pt states his arm feels better.

## 2013-09-15 NOTE — Progress Notes (Signed)
[facility] PROGRESS NOTE  Nicholas [name] DUK:[id number] DOB: 1941/03/27 DOA: 09/09/2013 PCP: [facility]  Assessment/Plan: C. difficile-complete 14 days of vancomycin treatment 09/24/13-have discontinued Protonix, senna, MiraLax, MOM, Dulcolax, Colace  Leukocytosis: monitor- check CBC in AM- trending up but diarrhea improving  Acute L1 fracture-Per NS.   ESRD MWF [facility]-appreciate nephro assist  Afib-not on Coumadin. On Amiodarone 200 daily. Rate Controlled.   Chronic Diastolic HF- BJSE83 % as of ECHO 12/02/12-Euvolemic. Dialysis dependant.   DM  2-SS1 Coverage.   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 Difficile-   Code Status: full Family Communication: patient Disposition Plan: per primary    HPI/Subjective: Diarrhea improved No SOB, no CP Good appetite  Objective: Filed Vitals:   09/15/13 0622  BP: 97/55  Pulse: 90  Temp: 99.4 F (37.4 C)  Resp: 26    Intake/Output Summary (Last 24 hours) at 09/15/13 0842 Last data filed at 09/14/13 1209  Gross per 24 hour  Intake      0 ml  Output   2000 ml  Net  -2000 ml   Filed Weights   09/12/13 1223 09/14/13 0758 09/14/13 1209  Weight: 70.2 kg (154 lb 12.2 oz) 70.8 kg (156 lb 1.4 oz) 68.3 kg (150 lb 9.2 oz)    Exam:  Cardiovascular: EOMI, frail Respiratory: s1 s2 no m/r/g  Abdomen: soft, NT, ND  Skin:  no LE edema, Midline chest scar  Neuro intact. Have not assesed ROM 2/2 to pain   Data Reviewed: Basic Metabolic Panel:  Recent Labs Lab 09/09/13 1328 09/10/13 0434 09/12/13 0625 09/13/13 0640 09/14/13 0500  NA 138 134* 134* 138 136*   K 4.4 4.8 4.0 3.5* 3.8  CL 94* 93* 91* 94* 94*  CO2 29 25 26 27 28   GLUCOSE 91 81 80 105* 140*  BUN 41* 49* 38* 22 36*  CREATININE 4.45* 4.93* 4.15* 3.13* 4.35*  CALCIUM 8.4 8.3* 8.4 8.0* 7.8*  PHOS  --  3.7 2.5  --  2.6   Liver Function Tests:  Recent Labs Lab 09/10/13 0434 09/12/13 0625 09/13/13 0640 09/14/13 0500  AST  --   --  11  --   ALT  --   --  <5  --   ALKPHOS  --   --  151*  --   BILITOT  --   --  0.3  --   PROT  --   --  5.8*  --   ALBUMIN 2.0* 2.1* 1.9* 1.8*   No results found for this basename: LIPASE, AMYLASE,  in the last 168 hours No results found for this basename: AMMONIA,  in the last 168 hours CBC:  Recent Labs Lab 09/09/13 1328 09/10/13 0434 09/12/13 0625 09/13/13 0640 09/14/13 0500  WBC 17.0* 17.9* 15.3* 19.7* 23.0*  NEUTROABS 15.0*  --  12.2* 17.2*  --   HGB 10.0* 9.8* 10.7* 10.2* 9.3*  HCT 31.9* 31.4* 34.6* 32.8* 29.8*  MCV 94.1 92.1 94.5 94.0 93.4  PLT 303 358 390 376 386   Cardiac Enzymes:  Recent Labs Lab 09/09/13 1750  TROPONINI <0.30   BNP (last 3 results)  Recent Labs  10/20/12 0320  PROBNP 38564.0*   CBG:  Recent Labs Lab  09/14/13 0625 09/14/13 1331 09/14/13 1635 09/14/13 2136 09/15/13 0737  GLUCAP 132* 123* 142* 119* 147*    Recent Results (from the past 240 hour(s))  CLOSTRIDIUM DIFFICILE BY PCR     Status: Abnormal   Collection Time    09/10/13 12:56 AM      Result Value Range Status   C difficile by pcr POSITIVE (*) NEGATIVE Final   Comment: CRITICAL RESULT CALLED TO, READ BACK BY AND VERIFIED WITH:     E.ANSOMAA,RN 1036 09/10/13 M.CAMPBELL     Studies: No results found.  Scheduled Meds: . allopurinol  100 mg Oral BID  . amiodarone  200 mg Oral Daily  . darbepoetin (ARANESP) injection - DIALYSIS  60 mcg Intravenous Q Mon-HD  . doxercalciferol  2 mcg Intravenous Q M,W,F-HD  . feeding supplement (NEPRO CARB STEADY)  237 mL Oral TID BM  . gabapentin  300 mg Oral BID  . Gerhardt's butt cream    Topical TID  . heparin  5,000 Units Subcutaneous Q8H  . insulin aspart  0-9 Units Subcutaneous TID WC  . ipratropium  2 spray Each Nare TID  . lidocaine  1 patch Transdermal Q24H  . midodrine  5 mg Oral 3 times weekly  . multivitamin  1 tablet Oral QHS  . multivitamin with minerals  1 tablet Oral Daily  . sevelamer carbonate  800 mg Oral TID WC  . vancomycin  125 mg Oral QID  . zolpidem  5 mg Oral QHS   Continuous Infusions: . sodium chloride 20 mL/hr at 09/10/13 0050    Active Problems:   ESRD (end stage renal disease) on dialysis   DM (diabetes mellitus)   CAD (coronary artery disease)   S/P CABG x 5   Systolic CHF   Leukocytosis   L1 vertebral fracture   Protein-calorie malnutrition, severe    Time spent: 15 min    VANN, JESSICA  [facility] Pager [phone]. If 7PM-7AM, please contact night-coverage at [email or web address], password [email or web address] 09/15/2013, 8:42 AM  LOS: 6 days

## 2013-09-16 LAB — GLUCOSE, CAPILLARY
GLUCOSE-CAPILLARY: 135 mg/dL — AB (ref 70–99)
GLUCOSE-CAPILLARY: 133 mg/dL — AB (ref 70–99)
Glucose-Capillary: 199 mg/dL — ABNORMAL HIGH (ref 70–99)

## 2013-09-16 LAB — CBC
HEMATOCRIT: 29.1 % — AB (ref 39.0–52.0)
Hemoglobin: 9 g/dL — ABNORMAL LOW (ref 13.0–17.0)
MCH: 29.2 pg (ref 26.0–34.0)
MCHC: 30.9 g/dL (ref 30.0–36.0)
MCV: 94.5 fL (ref 78.0–100.0)
PLATELETS: 317 10*3/uL (ref 150–400)
RBC: 3.08 MIL/uL — ABNORMAL LOW (ref 4.22–5.81)
RDW: 17.5 % — AB (ref 11.5–15.5)
WBC: 15.4 10*3/uL — AB (ref 4.0–10.5)

## 2013-09-16 LAB — RENAL FUNCTION PANEL
Albumin: 1.9 g/dL — ABNORMAL LOW (ref 3.5–5.2)
BUN: 43 mg/dL — AB (ref 6–23)
CALCIUM: 8.2 mg/dL — AB (ref 8.4–10.5)
CHLORIDE: 87 meq/L — AB (ref 96–112)
CO2: 28 meq/L (ref 19–32)
Creatinine, Ser: 4.7 mg/dL — ABNORMAL HIGH (ref 0.50–1.35)
GFR calc Af Amer: 13 mL/min — ABNORMAL LOW (ref >90)
GFR, EST NON AFRICAN AMERICAN: 11 mL/min — AB (ref >90)
Glucose, Bld: 105 mg/dL — ABNORMAL HIGH (ref 70–99)
Phosphorus: 1.6 mg/dL — ABNORMAL LOW (ref 2.3–4.6)
Potassium: 4.7 mEq/L (ref 3.7–5.3)
Sodium: 128 mEq/L — ABNORMAL LOW (ref 137–147)

## 2013-09-16 MED ORDER — DOXERCALCIFEROL 4 MCG/2ML IV SOLN
INTRAVENOUS | Status: AC
  Filled 2013-09-16: qty 2

## 2013-09-16 NOTE — Evaluation (Signed)
Physical Therapy Evaluation Patient Details Name: Nicholas [name] MRN: [id number] DOB: [birth year] Today's Date: 09/16/2013 Time: 2458-0998 PT Time Calculation (min): 52 min  PT Assessment / Plan / Recommendation History of Present Illness  Nicholas [name] is a 73 y.o. male who presents to the emergency department brought in by EMS for persistent back pain after a fall approximately one week prior to Christmas. At that time, he says he was walking when his right foot slipped and he fell backwards onto his right side. Since that time he has had significant low back pain which initially was across his back, but now as described primarily as on the right side of his low back. He denies any radiation into either the left or the right leg, or into the groin. He denies any weakness of the legs, or numbness, or tingling. The patient is a dialysis patient and therefore does not produce urine, however he denies any changes in bowel habits, or incontinence. Plan is for conservative management of L1 fx.  Clinical Impression  Pt demonstrated dependencies in mobility and general weakness. Pt is very arthritic and will need an increased time for rehab. Pt will benefit from skilled PT in an acute setting for increased mobility and Independence for d/c to skilled nursing facility for further therapy.    PT Assessment  Patient needs continued PT services    Follow Up Recommendations  SNF    Does the patient have the potential to tolerate intense rehabilitation      Barriers to Discharge Decreased caregiver support pt lives alone    Equipment Recommendations  None recommended by PT    Recommendations for Other Services     Frequency Min 5X/week    Precautions / Restrictions Precautions Precautions: Back Precaution Comments: Reviewed back precautions and donned LSO brace in sitting Required Braces or Orthoses: Spinal Brace Spinal Brace: Lumbar corset;Applied in supine position Restrictions Weight  Bearing Restrictions: No   Pertinent Vitals/Pain       Mobility  Bed Mobility Overal bed mobility: Needs Assistance Bed Mobility: Rolling;Sidelying to Sit Rolling: Supervision Sidelying to sit: Min assist General bed mobility comments: use of rails and mod cues for technique Transfers Overall transfer level: Needs assistance Equipment used:  (tall recliner place in front of patient for standing.) Transfers: Sit to/from Stand Sit to Stand: Mod assist General transfer comment: max cues for forward weight shit and upright posture. Pt maintaines a posterior lean and unable to fully achieve upright posture. Pt stood with recliner in front to assist with standing and give him a target to stand up to. Pt was able to take a couple of steps forward and back with mod/max assist.    Exercises     PT Diagnosis: Difficulty walking;Generalized weakness  PT Problem List: Decreased strength;Decreased activity tolerance;Decreased balance;Decreased mobility;Decreased cognition PT Treatment Interventions: DME instruction;Gait training;Functional mobility training;Therapeutic activities;Therapeutic exercise;Balance training;Patient/family education     PT Goals(Current goals can be found in the care plan section) Acute Rehab PT Goals Patient Stated Goal: To return home after SNF PT Goal Formulation: With patient Time For Goal Achievement: 09/22/13 Potential to Achieve Goals: Fair  Visit Information  Last PT Received On: 09/16/13 Assistance Needed: +2 History of Present Illness: Nicholas [name] is a 73 y.o. male who presents to the emergency department brought in by EMS for persistent back pain after a fall approximately one week prior to Christmas. At that time, he says he was walking when his right foot slipped and he  fell backwards onto his right side. Since that time he has had significant low back pain which initially was across his back, but now as described primarily as on the right side of  his low back. He denies any radiation into either the left or the right leg, or into the groin. He denies any weakness of the legs, or numbness, or tingling. The patient is a dialysis patient and therefore does not produce urine, however he denies any changes in bowel habits, or incontinence. Plan is for conservative management of L1 fx.       Prior Functioning  Home Living Family/patient expects to be discharged to:: Private residence Living Arrangements: Alone Type of Home: House Home Access: Stairs to enter [place]: One level Home Equipment: Environmental consultant - 2 wheels Prior Function Level of Independence: Independent with assistive device(s) Communication Communication: No difficulties    Cognition  Cognition Arousal/Alertness: Awake/alert Behavior During Therapy: Flat affect Overall Cognitive Status: Impaired/Different from baseline Area of Impairment: Attention;Memory;Safety/judgement;Problem solving Current Attention Level: Selective Memory: Decreased recall of precautions Problem Solving: Slow processing General Comments: Pt demonstrated some confusion at the end of the session. Suspect due to fatigue and medication.    Extremity/Trunk Assessment Lower Extremity Assessment Lower Extremity Assessment: Generalized weakness   Balance Balance Overall balance assessment: Needs assistance Sitting-balance support: Feet supported Sitting balance-Leahy Scale: Fair Sitting balance - Comments: leans posterior at times Postural control: Posterior lean Standing balance support: Bilateral upper extremity supported Standing balance-Leahy Scale: Poor Standing balance comment: leans posteriorly  End of Session PT - End of Session Equipment Utilized During Treatment: Gait belt;Back brace Activity Tolerance: Patient limited by pain;Patient limited by fatigue Patient left: in bed;with call bell/phone within reach;with bed alarm set Nurse Communication: Mobility status  GP     Lelon Mast 09/16/2013, 10:34 AM

## 2013-09-16 NOTE — Progress Notes (Signed)
[facility] Progress Note   Subjective: C/o back pain, ;poor vision when standing  Filed Vitals:   09/15/13 1815 09/15/13 2112 09/16/13 0200 09/16/13 0522  BP: 93/67 124/71 103/61 121/70  Pulse: 86 84 88 95  Temp: 98.1 F (36.7 C) 97.9 F (36.6 C) 97.7 F (36.5 C) 97.8 F (36.6 C)  TempSrc: Oral Oral Oral Oral  Resp: 20 18 18 18   Height:      Weight:      SpO2: 93% 100% 100% 98%  EXAM:  General appearance: Alert, in no apparent distress  Resp: CTA without rales, rhonchi, or wheezes  Cardio: Irregular rhythm without murmur or rub  GI: + BS, soft and nontender  Extremities: No edema  Access: AVF @ LFA with BFR 400 cc/min   Dialysis: MWF [facility]  4h   69kg    2K/2.25 Bath   Heparin 3000    LFA AVF Hectorol 2   Epogen 5000  Venofer 100 mg IV/HD through 1/28  Recent labs: Hgb 10.4, Tsat 15% in December, P 5.0, PTH 296   Assessment/Plan  1. L1 vertebral fracture, not surgical candidate. Working with PT 2. C diff infection on vanc 3. ESRD on midodrine, below dry wt, volume is low > keep even w HD 4. Anemia - Hgb 10.2 on Aranesp 60 mcg on Mon, IV Fe (last dose of series tomorrow).  5. Sec HPT - Ca 8 (9.7 corrected), P 2.5; Hectorol 2 mcg, Renvela 1 with meals.  6. Nutrition - Alb 1.9, now regular diet, vitamin.  7. A-fib - on Amiodarone, no anticoagulation sec to GI bleed 11/971.  8. CAD/Systolic HF - Hx MI/CABG.  9. DM - Insulin per primary.    Kelly Splinter MD pager [phone]    cell [phone] 09/16/2013, 9:53 AM   Recent Labs Lab 09/10/13 0434 09/12/13 0625 09/13/13 0640 09/14/13 0500  NA 134* 134* 138 136*  K 4.8 4.0 3.5* 3.8  CL 93* 91* 94* 94*  CO2 25 26 27 28   GLUCOSE 81 80 105* 140*  BUN 49* 38* 22 36*  CREATININE 4.93* 4.15* 3.13* 4.35*  CALCIUM 8.3* 8.4 8.0* 7.8*  PHOS 3.7 2.5  --  2.6    Recent Labs Lab 09/12/13 0625 09/13/13 0640 09/14/13 0500  AST  --  11  --   ALT  --  <5  --   ALKPHOS  --  151*  --   BILITOT  --  0.3  --    PROT  --  5.8*  --   ALBUMIN 2.1* 1.9* 1.8*    Recent Labs Lab 09/09/13 1328  09/12/13 0625 09/13/13 0640 09/14/13 0500 09/16/13 0332  WBC 17.0*  < > 15.3* 19.7* 23.0* 15.4*  NEUTROABS 15.0*  --  12.2* 17.2*  --   --   HGB 10.0*  < > 10.7* 10.2* 9.3* 9.0*  HCT 31.9*  < > 34.6* 32.8* 29.8* 29.1*  MCV 94.1  < > 94.5 94.0 93.4 94.5  PLT 303  < > 390 376 386 317  < > = values in this interval not displayed. [name] allopurinol  100 mg Oral BID  . amiodarone  200 mg Oral Daily  . darbepoetin (ARANESP) injection - DIALYSIS  60 mcg Intravenous Q Mon-HD  . doxercalciferol  2 mcg Intravenous Q M,W,F-HD  . feeding supplement (NEPRO CARB STEADY)  237 mL Oral BID  . feeding supplement (RESOURCE BREEZE)  1 Container Oral Q24H  . gabapentin  300 mg Oral BID  .  Gerhardt's butt cream   Topical TID  . heparin  5,000 Units Subcutaneous Q8H  . insulin aspart  0-9 Units Subcutaneous TID WC  . ipratropium  2 spray Each Nare TID  . lidocaine  1 patch Transdermal Q24H  . midodrine  5 mg Oral 3 times weekly  . multivitamin  1 tablet Oral QHS  . multivitamin with minerals  1 tablet Oral Daily  . sevelamer carbonate  800 mg Oral TID WC  . vancomycin  125 mg Oral QID  . zolpidem  5 mg Oral QHS   . sodium chloride Stopped (09/16/13 0700)   acetaminophen, midodrine, morphine injection, oxyCODONE, promethazine, simethicone, traMADol-acetaminophen

## 2013-09-16 NOTE — Progress Notes (Signed)
PHYSICIAN PROGRESS NOTE  Nicholas [name] RKY:[id number] DOB: October 18, 1940 DOA: 09/09/2013  09/16/2013   PCP: Nicholas [name]  Assessment/Plan: C. difficile-complete 14 days of vancomycin treatment 09/24/13-have discontinued Protonix, senna, MiraLax, MOM, Dulcolax, Colace   Leukocytosis: improving, monitor- check CBC in AM- diarrhea improving   Acute L1 fracture-Per NS. Pain better controlled. PT/OT  ESRD MWF [facility]-appreciate nephro assist. Pt to HD today  Afib-not on Coumadin. On Amiodarone 200 daily. Rate Controlled.   Chronic Diastolic HF- BTDV76 % as of ECHO 12/02/12-Euvolemic. Dialysis dependant.   DM 2-SS1 Coverage. 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 Difficile-   Code Status: FULL Family Communication: none at bedside  Disposition Plan: per primary team  HPI/Subjective: Pt reports that his back pain is some better today  Objective: Filed Vitals:   09/16/13 0522  BP: 121/70  Pulse: 95  Temp: 97.8 F (36.6 C)  Resp: 18    Intake/Output Summary (Last 24 hours) at 09/16/13 1031 Last data filed at 09/16/13 0700  Gross per 24 hour  Intake   1010 ml  Output      0 ml  Net   1010 ml   Filed Weights   09/12/13 1223 09/14/13 0758 09/14/13 1209  Weight: 154 lb 12.2 oz (70.2 kg) 156 lb 1.4 oz (70.8 kg) 150 lb 9.2 oz (68.3 kg)    Exam:  General: awake, alert, no distress, cooperative Cardiovascular: EOMI, frail  Respiratory: s1 s2 no m/r/g  Abdomen: soft, NT, ND  Skin: no LE edema, Midline chest scar  Neuro intact. Have not assesed ROM 2/2 to pain  Data Reviewed: Basic  Metabolic Panel:  Recent Labs [name] 09/09/13 1328 09/10/13 0434 09/12/13 0625 09/13/13 0640 09/14/13 0500  NA 138 134* 134* 138 136*  K 4.4 4.8 4.0 3.5* 3.8  CL 94* 93* 91* 94* 94*  CO2 29 25 26 27 28   GLUCOSE 91 81 80 105* 140*  BUN 41* 49* 38* 22 36*  CREATININE 4.45* 4.93* 4.15* 3.13* 4.35*  CALCIUM 8.4 8.3* 8.4 8.0* 7.8*  PHOS  --  3.7 2.5  --  2.6   Liver Function Tests:  Recent Labs [name] 09/10/13 0434 09/12/13 0625 09/13/13 0640 09/14/13 0500  AST  --   --  11  --   ALT  --   --  <5  --   ALKPHOS  --   --  151*  --   BILITOT  --   --  0.3  --   PROT  --   --  5.8*  --   ALBUMIN 2.0* 2.1* 1.9* 1.8*   No results found for this basename: LIPASE, AMYLASE,  in the last 168 hours No results found for this basename: AMMONIA,  in the last 168 hours CBC:  Recent Labs [name] 09/09/13 1328 09/10/13 0434 09/12/13 0625 09/13/13 0640 09/14/13 0500 09/16/13 0332  WBC 17.0* 17.9* 15.3* 19.7* 23.0* 15.4*  NEUTROABS 15.0*  --  12.2* 17.2*  --   --   HGB 10.0* 9.8* 10.7* 10.2* 9.3* 9.0*  HCT 31.9* 31.4* 34.6* 32.8* 29.8* 29.1*  MCV 94.1 92.1 94.5 94.0 93.4 94.5  PLT 303 358 390 376 386 317  Cardiac Enzymes:  Recent Labs [name] 09/09/13 1750  TROPONINI <0.30   BNP (last 3 results)  Recent Labs  10/20/12 0320  PROBNP 38564.0*   CBG:  Recent Labs [name] 09/15/13 0737 09/15/13 1148 09/15/13 1614 09/15/13 2117 09/16/13 0652  GLUCAP 147* 204* 183* 136* 135*    Recent Results (from the past 240 hour(s))  CLOSTRIDIUM DIFFICILE BY PCR     Status: Abnormal   Collection Time    09/10/13 12:56 AM      Result Value Range Status   C difficile by pcr POSITIVE (*) NEGATIVE Final   Comment: CRITICAL RESULT CALLED TO, READ BACK BY AND VERIFIED WITH:     E.ANSOMAA,RN 1036 09/10/13 M.CAMPBELL    Studies: No results found.  Scheduled Meds: . allopurinol  100 mg Oral BID  . amiodarone  200 mg Oral Daily  . darbepoetin (ARANESP) injection - DIALYSIS  60 mcg Intravenous  Q Mon-HD  . doxercalciferol  2 mcg Intravenous Q M,W,F-HD  . feeding supplement (NEPRO CARB STEADY)  237 mL Oral BID  . feeding supplement (RESOURCE BREEZE)  1 Container Oral Q24H  . gabapentin  300 mg Oral BID  . Gerhardt's butt cream   Topical TID  . heparin  5,000 Units Subcutaneous Q8H  . insulin aspart  0-9 Units Subcutaneous TID WC  . ipratropium  2 spray Each Nare TID  . lidocaine  1 patch Transdermal Q24H  . midodrine  5 mg Oral 3 times weekly  . multivitamin  1 tablet Oral QHS  . multivitamin with minerals  1 tablet Oral Daily  . sevelamer carbonate  800 mg Oral TID WC  . vancomycin  125 mg Oral QID  . zolpidem  5 mg Oral QHS   Continuous Infusions: . sodium chloride Stopped (09/16/13 0700)   Active Problems:   ESRD (end stage renal disease) on dialysis   DM (diabetes mellitus)   CAD (coronary artery disease)   S/P CABG x 5   Systolic CHF   Leukocytosis   L1 vertebral fracture   Protein-calorie malnutrition, severe  Nicholas [name] [employer] [phone]. If 7PM-7AM, please contact night-coverage at [email or web address], password [facility] 09/16/2013, 10:31 AM  LOS: 7 days

## 2013-09-16 NOTE — Clinical Social Work Placement (Signed)
Clinical Social Work Department CLINICAL SOCIAL WORK PLACEMENT NOTE 09/16/2013  Patient:  Nicholas [name], Nicholas [name]  Account Number:  [id number] Admit date:  09/09/2013  Clinical Social Worker:  Raquel Sarna [place], LCSWA  Date/time:  09/16/2013 01:36 PM  Clinical Social Work is seeking post-discharge placement for this patient at the following level of care:   [place]   (*CSW will update this form in Epic as items are completed)   09/16/2013  Patient/family provided with [place] Department of Clinical Social Works list of facilities offering this level of care within the geographic area requested by the patient (or if unable, by the patients family).  09/16/2013  Patient/family informed of their freedom to choose among providers that offer the needed level of care, that participate in Medicare, Medicaid or managed care program needed by the patient, have an available bed and are willing to accept the patient.  09/16/2013  Patient/family informed of [facility] ownership interest in [facility], as well as of the fact that they are under no obligation to receive care at this facility.  PASARR submitted to EDS on  PASARR number received from [place] on 09/16/2013  FL2 transmitted to all facilities in geographic area requested by pt/family on  09/16/2013 FL2 transmitted to all facilities within larger geographic area on   Patient informed that his/her managed care company has contracts with or will negotiate with  certain facilities, including the following:     Patient/family informed of bed offers received:   Patient chooses bed at  Physician recommends and patient chooses bed at    Patient to be transferred to  on   Patient to be transferred to facility by   The following physician request were entered in Epic:   Additional Comments: PASARR already existed.  Pati Gallo, [place] [employer] Social Worker [phone]

## 2013-09-16 NOTE — Evaluation (Signed)
Occupational Therapy Evaluation Patient Details Name: Nicholas [name] MRN: [id number] DOB: 1940/12/10 Today's Date: 09/16/2013 Time: 1007-1219 OT Time Calculation (min): 38 min  OT Assessment / Plan / Recommendation History of present illness Nicholas [name] is a 73 y.o. male who presents to the emergency department brought in by EMS for persistent back pain after a fall approximately one week prior to Christmas. At that time, he says he was walking when his right foot slipped and he fell backwards onto his right side. Since that time he has had significant low back pain which initially was across his back, but now as described primarily as on the right side of his low back. He denies any radiation into either the left or the right leg, or into the groin. He denies any weakness of the legs, or numbness, or tingling. The patient is a dialysis patient and therefore does not produce urine, however he denies any changes in bowel habits, or incontinence. Plan is for conservative management of L1 fx.   Clinical Impression   Pt with severe limitations in selfcare independence and transfer ability secondary to increased back pain, lethargy, and increased fear of falling.  Feel pt will benefit from acute care OT services to help increase overall independence and reduce burden of care for shorter stay at SNF, which he will need prior to discharge home.     OT Assessment  Patient needs continued OT Services    Follow Up Recommendations  SNF;Supervision/Assistance - 24 hour       Equipment Recommendations  None recommended by OT       Frequency  Min 2X/week    Precautions / Restrictions Precautions Precautions: Back Precaution Comments: Reviewed back precautions  Required Braces or Orthoses: Spinal Brace Spinal Brace: Lumbar corset;Applied in supine position Restrictions Weight Bearing Restrictions: No   Pertinent Vitals/Pain 8/10 in his lower back meds brought prior to session, pt repositioned  in bed at end of session.    ADL  Eating/Feeding: Simulated;Independent Where Assessed - Eating/Feeding: Bed level Grooming: Simulated;Minimal assistance Where Assessed - Grooming: Unsupported standing Upper Body Bathing: Simulated;Minimal assistance Where Assessed - Upper Body Bathing: Unsupported sitting Lower Body Bathing: Simulated;+2 Total assistance Lower Body Bathing: Patient Percentage: 20% Where Assessed - Lower Body Bathing: Unsupported sit to stand Upper Body Dressing: Performed;Maximal assistance Where Assessed - Upper Body Dressing: Supine, head of bed flat;Rolling right and/or left (for LSO) Lower Body Dressing: Simulated;+2 Total assistance Lower Body Dressing: Patient Percentage: 20% Where Assessed - Lower Body Dressing: Supported sit to stand Toilet Transfer: Simulated;+2 Total assistance Toilet Transfer: Patient Percentage: 20% Statistician Method: Ambulance person: Other (comment) (At EOB pt declined need to toilet) Toileting - Clothing Manipulation and Hygiene: Simulated;+2 Total assistance Toileting - Clothing Manipulation and Hygiene: Patient Percentage: 20% Where Assessed - Glass blower/designer Manipulation and Hygiene: Other (comment) (sit to stand from the EOB) Tub/Shower Transfer Method: Not assessed Transfers/Ambulation Related to ADLs: Pt unable to ambulate during session. He needed +2 total assist (pt 20%) for scooting up the edge of the bed. ADL Comments: Pt will need SNF as he currently needs +2 assist for toileting and LB selfcare tasks sit to stand.  Introduced AE but did not practice this session secondary to pt's pain level and decreased sustained attention.  Pt reports having a reacher and sockaide at home.    OT Diagnosis: Generalized weakness;Acute pain  OT Problem List: Decreased strength;Decreased activity tolerance;Impaired balance (sitting and/or standing);Pain;Decreased cognition;Decreased knowledge of use  of DME or  AE;Decreased safety awareness OT Treatment Interventions: Self-care/ADL training;Patient/family education;Balance training;Neuromuscular education;Therapeutic activities;DME and/or AE instruction   OT Goals(Current goals can be found in the care plan section) Acute Rehab OT Goals Patient Stated Goal: Pt wants his pain to go away,. Time For Goal Achievement: 09/30/13 Potential to Achieve Goals: Fair  Visit Information  Last OT Received On: 09/16/13 Assistance Needed: +2 History of Present Illness: Nicholas [name] is a 73 y.o. male who presents to the emergency department brought in by EMS for persistent back pain after a fall approximately one week prior to Christmas. At that time, he says he was walking when his right foot slipped and he fell backwards onto his right side. Since that time he has had significant low back pain which initially was across his back, but now as described primarily as on the right side of his low back. He denies any radiation into either the left or the right leg, or into the groin. He denies any weakness of the legs, or numbness, or tingling. The patient is a dialysis patient and therefore does not produce urine, however he denies any changes in bowel habits, or incontinence. Plan is for conservative management of L1 fx.       Prior Functioning     Home Living Family/patient expects to be discharged to:: Private residence Living Arrangements: Alone Type of Home: House Home Access: Stairs to enter Home Layout: One level Home Equipment: Environmental consultant - 2 wheels;Bedside commode Prior Function Level of Independence: Independent with assistive device(s) Communication Communication: No difficulties         Vision/Perception Vision - History Baseline Vision: No visual deficits Patient Visual Report: No change from baseline Vision - Assessment Eye Alignment: Within Functional Limits Perception Perception: Within Functional Limits Praxis Praxis: Intact    Cognition  Cognition Arousal/Alertness: Lethargic;Suspect due to medications Behavior During Therapy: Flat affect Overall Cognitive Status: Impaired/Different from baseline Area of Impairment: Attention;Memory Current Attention Level: Focused;Sustained Memory: Decreased recall of precautions Safety/Judgement: Decreased awareness of safety Problem Solving: Slow processing General Comments: Pt frequently closing eyes during session.  Slow processing of instructions secondary to attention.  May be attributable to medications.    Extremity/Trunk Assessment Upper Extremity Assessment Upper Extremity Assessment: Generalized weakness (No strength testing done except for grip which was WFLs, secondary to back precautions.) Lower Extremity Assessment Lower Extremity Assessment: Defer to PT evaluation Cervical / Trunk Assessment Cervical / Trunk Assessment: Normal     Mobility Bed Mobility Overal bed mobility: Needs Assistance Bed Mobility: Rolling;Sidelying to Sit;Sit to Sidelying Rolling: Mod assist Sidelying to sit: Max assist Sit to sidelying: Max assist General bed mobility comments: Pt able to roll 75% of way on the left side but needs max assist to finish rolling secondary to presence of pain.   Transfers Overall transfer level: Needs assistance Transfers: Squat Pivot Transfers Sit to Stand: +2 physical assistance Squat pivot transfers: +2 physical assistance General transfer comment: Pt with decreased pushing with his LEs with therapist in front of him to help him stand.  Reporting increased fear with sit to stand.        Balance Balance Overall balance assessment: Needs assistance Sitting-balance support: Feet supported Sitting balance-Leahy Scale: Fair Postural control: Posterior lean   End of Session OT - End of Session Equipment Utilized During Treatment: Back brace Activity Tolerance: Patient limited by lethargy;Patient limited by pain Patient left: in bed;with bed  alarm set;with call bell/phone within reach Nurse Communication: Mobility status  [place] Grayer,[place] OTR/L 09/16/2013, 3:06 PM

## 2013-09-16 NOTE — Clinical Social Work Psychosocial (Signed)
Clinical Social Work Department BRIEF PSYCHOSOCIAL ASSESSMENT 09/16/2013  Patient:  TAIYO, KOZMA     Account Number:  [id number]     Admit date:  09/09/2013  Clinical Social Worker:  Donna Christen  Date/Time:  09/16/2013 01:32 PM  Referred by:  Physician  Date Referred:  09/16/2013 Referred for  SNF Placement   Other Referral:   none   Interview type:  Patient Other interview type:   none    PSYCHOSOCIAL DATA Living Status:  FACILITY Admitted from facility:   Level of care:  [place] Primary support name:  Sricharan Lacomb Primary support relationship to patient:  SIBLING Degree of support available:   Strong degree of support.    CURRENT CONCERNS Current Concerns  Post-Acute Placement   Other Concerns:   none.    SOCIAL WORK ASSESSMENT / PLAN CSW met with pt at bedside. Pt stated that prior to coming to [facility] pt was living at a SNF but could not recall the name. Pt informed CSW that he was "not happy with my care" at the previous facility and would like for CSW to assist in placing him elsewhere. CSW to continue to follow for discharge planning needs.   Assessment/plan status:  Psychosocial Support/Ongoing Assessment of Needs Other assessment/ plan:   none.   Information/referral to community resources:   SNF bed offers in [place]: Pt was understanding and agreeable to CSW plan of care. Pt was appreciative of CSW "not making me return" to other facility.       Pati Gallo, [place] Social Worker [phone]

## 2013-09-17 LAB — GLUCOSE, CAPILLARY
Glucose-Capillary: 122 mg/dL — ABNORMAL HIGH (ref 70–99)
Glucose-Capillary: 147 mg/dL — ABNORMAL HIGH (ref 70–99)
Glucose-Capillary: 130 mg/dL — ABNORMAL HIGH (ref 70–99)

## 2013-09-17 MED ORDER — CALCIUM CARBONATE ANTACID 500 MG PO CHEW
1.0000 | CHEWABLE_TABLET | Freq: Once | ORAL | Status: AC
  Administered 2013-09-18: 200 mg via ORAL
  Filled 2013-09-17: qty 1

## 2013-09-17 NOTE — Progress Notes (Signed)
Patient came back from Dialysis around 2013. Alert and oriented X3 .Vitals stable. Pain medicine given for back pain and repositioned, Lumbar brace applied. Call bell within reach.

## 2013-09-17 NOTE — Progress Notes (Signed)
[facility] Progress Note   Subjective: NO complaints;, feeling better  Filed Vitals:   09/16/13 2147 09/17/13 0118 09/17/13 0546 09/17/13 1029  BP: 140/78 112/66 92/54 109/65  Pulse: 91 91 80 85  Temp: 97.8 F (36.6 C) 98 F (36.7 C) 98.2 F (36.8 C) 98.5 F (36.9 C)  TempSrc: Oral Oral Oral Oral  Resp: 16 18 18 18   Height:      Weight:      SpO2: 98% 98% 99% 99%  EXAM:  Alert, in no apparent distress  Resp: CTA without rales, rhonchi, or wheezes  Cardio: Irregular rhythm without murmur or rub  GI: + BS, soft and nontender  Extremities: No edema  Access: AVF @ LFA with BFR 400 cc/min   Dialysis: MWF [facility]  4h   69kg    2K/2.25 Bath   Heparin 3000    LFA AVF Hectorol 2   Epogen 5000  Venofer 100 mg IV/HD through 1/28  Recent labs: Hgb 10.4, Tsat 15% in December, P 5.0, PTH 296   Assessment 1 L1 vertebral fracture, plan for SNFP 2 Cdiff diarrhea 3 ESRD  4 Hypotension, on midodrine 5 2HTPH 6 AFib on amio 7 CAD hx CABG 8 DM on insulin  Plan- HD Monday     Kelly Splinter MD pager [phone]    cell [phone] 09/17/2013, 11:28 AM   Recent Labs Lab 09/12/13 0625 09/13/13 0640 09/14/13 0500 09/16/13 1618  NA 134* 138 136* 128*  K 4.0 3.5* 3.8 4.7  CL 91* 94* 94* 87*  CO2 26 27 28 28   GLUCOSE 80 105* 140* 105*  BUN 38* 22 36* 43*  CREATININE 4.15* 3.13* 4.35* 4.70*  CALCIUM 8.4 8.0* 7.8* 8.2*  PHOS 2.5  --  2.6 1.6*    Recent Labs Lab 09/13/13 0640 09/14/13 0500 09/16/13 1618  AST 11  --   --   ALT <5  --   --   ALKPHOS 151*  --   --   BILITOT 0.3  --   --   PROT 5.8*  --   --   ALBUMIN 1.9* 1.8* 1.9*    Recent Labs Lab 09/12/13 0625 09/13/13 0640 09/14/13 0500 09/16/13 0332  WBC 15.3* 19.7* 23.0* 15.4*  NEUTROABS 12.2* 17.2*  --   --   HGB 10.7* 10.2* 9.3* 9.0*  HCT 34.6* 32.8* 29.8* 29.1*  MCV 94.5 94.0 93.4 94.5  PLT 390 376 386 317   . allopurinol  100 mg Oral BID  . amiodarone  200 mg Oral Daily  . darbepoetin  (ARANESP) injection - DIALYSIS  60 mcg Intravenous Q Mon-HD  . doxercalciferol  2 mcg Intravenous Q M,W,F-HD  . feeding supplement (NEPRO CARB STEADY)  237 mL Oral BID  . feeding supplement (RESOURCE BREEZE)  1 Container Oral Q24H  . gabapentin  300 mg Oral BID  . Gerhardt's butt cream   Topical TID  . heparin  5,000 Units Subcutaneous Q8H  . insulin aspart  0-9 Units Subcutaneous TID WC  . ipratropium  2 spray Each Nare TID  . lidocaine  1 patch Transdermal Q24H  . midodrine  5 mg Oral 3 times weekly  . multivitamin  1 tablet Oral QHS  . multivitamin with minerals  1 tablet Oral Daily  . sevelamer carbonate  800 mg Oral TID WC  . vancomycin  125 mg Oral QID  . zolpidem  5 mg Oral QHS   . sodium chloride Stopped (09/16/13 0700)   acetaminophen, midodrine,  morphine injection, oxyCODONE, promethazine, simethicone, traMADol-acetaminophen

## 2013-09-17 NOTE — Progress Notes (Signed)
PHYSICIAN PROGRESS NOTE  NATHANIEL WAKELEY ACZ:[id number] DOB: 28-Jul-1941 DOA: 09/09/2013  09/17/2013   PCP: [facility]  Assessment/Plan: C. difficile-complete 14 days of vancomycin treatment 09/24/13-have discontinued Protonix, senna, MiraLax, MOM, Dulcolax, Colace   Leukocytosis: improving, monitor- check CBC in AM- diarrhea improving   Acute L1 fracture-Per NS. Pain better controlled. PT/OT ordered  ESRD MWF [facility]-appreciate nephro assist. HD per nephrology team  Afib-not on Coumadin. On Amiodarone 200 daily. Rate Controlled.   Chronic Diastolic HF- FUXN23 % as of ECHO 12/02/12-Euvolemic. Dialysis dependent.   DM 2-SS1 Coverage. 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 Difficile-   Code Status: FULL  Family Communication: none at bedside  Disposition Plan: per primary team  HPI/Subjective: Pt reports that his back pain is controlled.  He ambulated some yesterday with PT.   Objective: Filed Vitals:   09/17/13 1029  BP: 109/65  Pulse: 85  Temp: 98.5 F (36.9 C)  Resp: 18    Intake/Output Summary (Last 24 hours) at 09/17/13 1244 Last data filed at 09/17/13 0800  Gross per 24 hour  Intake    320 ml  Output   -200 ml  Net    520 ml   Filed Weights   09/14/13 1209 09/16/13 1517 09/16/13 1927  Weight: 150 lb 9.2 oz (68.3 kg) 154 lb 8.7 oz (70.1 kg) 155 lb 3.3 oz (70.4 kg)    Exam:  General: awake, alert, no distress, cooperative  Cardiovascular: EOMI, frail  Respiratory: s1 s2 no m/r/g  Abdomen: soft, NT, ND  Skin: no LE edema, Midline chest scar  Neuro intact. Have not  assesed ROM 2/2 to pain  Data Reviewed: Basic Metabolic Panel:  Recent Labs Lab 09/12/13 0625 09/13/13 0640 09/14/13 0500 09/16/13 1618  NA 134* 138 136* 128*  K 4.0 3.5* 3.8 4.7  CL 91* 94* 94* 87*  CO2 26 27 28 28   GLUCOSE 80 105* 140* 105*  BUN 38* 22 36* 43*  CREATININE 4.15* 3.13* 4.35* 4.70*  CALCIUM 8.4 8.0* 7.8* 8.2*  PHOS 2.5  --  2.6 1.6*   Liver Function Tests:  Recent Labs Lab 09/12/13 0625 09/13/13 0640 09/14/13 0500 09/16/13 1618  AST  --  11  --   --   ALT  --  <5  --   --   ALKPHOS  --  151*  --   --   BILITOT  --  0.3  --   --   PROT  --  5.8*  --   --   ALBUMIN 2.1* 1.9* 1.8* 1.9*   No results found for this basename: LIPASE, AMYLASE,  in the last 168 hours No results found for this basename: AMMONIA,  in the last 168 hours CBC:  Recent Labs Lab 09/12/13 0625 09/13/13 0640 09/14/13 0500 09/16/13 0332  WBC 15.3* 19.7* 23.0* 15.4*  NEUTROABS 12.2* 17.2*  --   --   HGB 10.7* 10.2* 9.3* 9.0*  HCT 34.6* 32.8* 29.8* 29.1*  MCV 94.5 94.0 93.4 94.5  PLT 390 376 386 317   Cardiac Enzymes: No results found for this basename: CKTOTAL, CKMB, CKMBINDEX, TROPONINI,  in the last 168 hours BNP (last 3 results)  Recent Labs  10/20/12 0320  PROBNP 38564.0*   CBG:  Recent Labs Lab 09/16/13 0652 09/16/13 1153 09/16/13 2131 09/17/13 0700 09/17/13 1140  GLUCAP 135* 199* 133* 122* 147*   Recent Results (from the past 240 hour(s))  CLOSTRIDIUM DIFFICILE BY PCR     Status: Abnormal   Collection Time    09/10/13 12:56 AM      Result Value Range Status   C difficile by pcr POSITIVE (*) NEGATIVE Final   Comment: CRITICAL RESULT CALLED TO, READ BACK BY AND VERIFIED WITH:     E.ANSOMAA,RN 1036 09/10/13 M.CAMPBELL    Studies: No results found.  Scheduled Meds: . allopurinol  100 mg Oral BID  . amiodarone  200 mg Oral Daily  . darbepoetin (ARANESP) injection - DIALYSIS  60 mcg Intravenous Q Mon-HD  . doxercalciferol  2 mcg Intravenous Q  M,W,F-HD  . feeding supplement (NEPRO CARB STEADY)  237 mL Oral BID  . feeding supplement (RESOURCE BREEZE)  1 Container Oral Q24H  . gabapentin  300 mg Oral BID  . Gerhardt's butt cream   Topical TID  . heparin  5,000 Units Subcutaneous Q8H  . insulin aspart  0-9 Units Subcutaneous TID WC  . ipratropium  2 spray Each Nare TID  . lidocaine  1 patch Transdermal Q24H  . midodrine  5 mg Oral 3 times weekly  . multivitamin  1 tablet Oral QHS  . multivitamin with minerals  1 tablet Oral Daily  . sevelamer carbonate  800 mg Oral TID WC  . vancomycin  125 mg Oral QID  . zolpidem  5 mg Oral QHS   Continuous Infusions: . sodium chloride Stopped (09/16/13 0700)    Active Problems:   ESRD (end stage renal disease) on dialysis   DM (diabetes mellitus)   CAD (coronary artery disease)   S/P CABG x 5   Systolic CHF   Leukocytosis   L1 vertebral fracture   Protein-calorie malnutrition, severe  [employer] [phone]. If 7PM-7AM, please contact night-coverage at [email or web address], password [email or web address] 09/17/2013, 12:44 PM  LOS: 8 days

## 2013-09-17 NOTE — Progress Notes (Signed)
Patient ID: Nicholas [name], male   DOB: 04/21/1941, 73 y.o.   MRN: [id number] Patient appears to be stable no significant back pain well managed the medication and a back brace.  Strength appears to be 5 out of 5 except maybe some slight hip flexor weakness in the left at 4+ out of 5  Continue progressive mobilization physical and occupational t therapy in the brace

## 2013-09-18 DIAGNOSIS — K5900 Constipation, unspecified: Secondary | ICD-10-CM | POA: Diagnosis not present

## 2013-09-18 LAB — GLUCOSE, CAPILLARY
GLUCOSE-CAPILLARY: 108 mg/dL — AB (ref 70–99)
GLUCOSE-CAPILLARY: 103 mg/dL — AB (ref 70–99)
Glucose-Capillary: 141 mg/dL — ABNORMAL HIGH (ref 70–99)
Glucose-Capillary: 110 mg/dL — ABNORMAL HIGH (ref 70–99)
Glucose-Capillary: 112 mg/dL — ABNORMAL HIGH (ref 70–99)
Glucose-Capillary: 121 mg/dL — ABNORMAL HIGH (ref 70–99)

## 2013-09-18 LAB — CLOSTRIDIUM DIFFICILE BY PCR: Toxigenic C. Difficile by PCR: NEGATIVE

## 2013-09-18 MED ORDER — PANTOPRAZOLE SODIUM 40 MG PO TBEC
40.0000 mg | DELAYED_RELEASE_TABLET | Freq: Every day | ORAL | Status: DC
  Administered 2013-09-18 – 2013-09-20 (×3): 40 mg via ORAL
  Filled 2013-09-18 (×4): qty 1

## 2013-09-18 MED ORDER — SENNOSIDES-DOCUSATE SODIUM 8.6-50 MG PO TABS
1.0000 | ORAL_TABLET | Freq: Two times a day (BID) | ORAL | Status: DC
  Administered 2013-09-18: 1 via ORAL
  Filled 2013-09-18: qty 1

## 2013-09-18 NOTE — Progress Notes (Signed)
[facility] Progress Note  Subjective:   Feels terrible; + Nausea no diarrhea or vomiting; had I/O cath due to inability to urinate and bladder scan - no results in chart; belly distended - didn't have  PTA; not passing gas. Didn't eat breakfast  Objective Filed Vitals:   09/17/13 2141 09/18/13 0217 09/18/13 0543 09/18/13 1048  BP: 126/69 101/62 125/91 109/60  Pulse: 85 92 85 91  Temp: 97 F (36.1 C) 98 F (36.7 C) 98.2 F (36.8 C) 99.4 F (37.4 C)  TempSrc: Oral Oral Oral Oral  Resp: 18 18 18 18   Height:      Weight:      SpO2: 90% 90% 100% 99%   Physical Exam General: uncomfortable Heart: irreg irreg Lungs: no wheezes or rales Abdomen: distended, + BS Extremities: no LE edema Dialysis Access: left lower AVF + bruit  Dialysis: MWF [facility]  4h 69kg 2K/2.25 Bath Heparin 3000 LFA AVF  Hectorol 2 Epogen 5000 Venofer 100 mg IV/HD through 1/28  Recent labs: Hgb 10.4, Tsat 15% in December, P 5.0, PTH 296   Assessment/Plan: 1. L1 acute fx - per NS 2. ESRD - MWF - HD tomorrow back on usual schedule 3. Anemia - Hgb trending down to 9, Aranesp 40 and s/p course of IV fe - check Fe studies 4. Secondary hyperparathyroidism - P low - stop binders 5. HTN/volume - vol ok; on midodrine for BP support; UF 2 L Wed and + 200 Friday with post weight 70.4 6. Nutrition - on low K diet 7. C diff - on po vanc through 2/7; off PPI 8. Afib - rate controlled on amio; no coumadin 9. Abdominal distention - d/w RN had 275 on Bladder scan and I/0 about 250; per primary  Myriam Jacobson, PA-C [place] 09/18/2013,11:35 AM  LOS: 9 days   I have seen and examined patient, discussed with PA and agree with assessment and plan as outlined above. Kelly Splinter MD pager [phone]    cell [phone] 09/18/2013, 12:29 PM    Additional Objective Labs: Basic Metabolic Panel:  Recent Labs Lab 09/12/13 0625 09/13/13 0640 09/14/13 0500 09/16/13 1618  NA  134* 138 136* 128*  K 4.0 3.5* 3.8 4.7  CL 91* 94* 94* 87*  CO2 26 27 28 28   GLUCOSE 80 105* 140* 105*  BUN 38* 22 36* 43*  CREATININE 4.15* 3.13* 4.35* 4.70*  CALCIUM 8.4 8.0* 7.8* 8.2*  PHOS 2.5  --  2.6 1.6*   Liver Function Tests:  Recent Labs Lab 09/13/13 0640 09/14/13 0500 09/16/13 1618  AST 11  --   --   ALT <5  --   --   ALKPHOS 151*  --   --   BILITOT 0.3  --   --   PROT 5.8*  --   --   ALBUMIN 1.9* 1.8* 1.9*   CBC:  Recent Labs Lab 09/12/13 0625 09/13/13 0640 09/14/13 0500 09/16/13 0332  WBC 15.3* 19.7* 23.0* 15.4*  NEUTROABS 12.2* 17.2*  --   --   HGB 10.7* 10.2* 9.3* 9.0*  HCT 34.6* 32.8* 29.8* 29.1*  MCV 94.5 94.0 93.4 94.5  PLT 390 376 386 317  CBG:  Recent Labs Lab 09/16/13 1153 09/16/13 2131 09/17/13 0700 09/17/13 1140 09/17/13 1617  GLUCAP 199* 133* 122* 147* 130*  Medications: . sodium chloride Stopped (09/16/13 0700)   . allopurinol  100 mg Oral BID  . amiodarone  200 mg Oral Daily  . darbepoetin (  ARANESP) injection - DIALYSIS  60 mcg Intravenous Q Mon-HD  . doxercalciferol  2 mcg Intravenous Q M,W,F-HD  . feeding supplement (NEPRO CARB STEADY)  237 mL Oral BID  . feeding supplement (RESOURCE BREEZE)  1 Container Oral Q24H  . gabapentin  300 mg Oral BID  . Gerhardt's butt cream   Topical TID  . heparin  5,000 Units Subcutaneous Q8H  . insulin aspart  0-9 Units Subcutaneous TID WC  . ipratropium  2 spray Each Nare TID  . lidocaine  1 patch Transdermal Q24H  . midodrine  5 mg Oral 3 times weekly  . multivitamin  1 tablet Oral QHS  . multivitamin with minerals  1 tablet Oral Daily  . pantoprazole  40 mg Oral Q0600  . senna-docusate  1 tablet Oral BID  . sevelamer carbonate  800 mg Oral TID WC  . vancomycin  125 mg Oral QID  . zolpidem  5 mg Oral QHS

## 2013-09-18 NOTE — Progress Notes (Signed)
Patient ID: Nicholas [name], male   DOB: 1941/05/18, 73 y.o.   MRN: [id number] Overall is doing okay no significant leg pain back pain is managed well he feels like his bladder is distended so we'll check a bladder scan  Strength appears to be 5 out of 5 except for some pain limited hip flexor weakness in the left  Continue to mobilize in his brace with physical therapy.

## 2013-09-18 NOTE — Progress Notes (Signed)
Patient c/o bladder retention; bladder scan revealed 228ml; intermittent straight cath. Using sterile tech. For 250 ml residual urine; dark amber, with odor, and sediment.

## 2013-09-18 NOTE — Progress Notes (Signed)
Clinical Social Worker (CSW) contacted patient's brother Thoma Paulsen [phone] and gave bed offers. Brother reported that he would look over bed offers and make a decision soon and follow up with weekday CSW.   Blima Rich, [place] Weekend CSW [phone]

## 2013-09-18 NOTE — Progress Notes (Addendum)
Assisted patient out of bed; took a few good steps with the walker; passed a large amount of gas and stool into the bedside commode; formed to loose stool; patient stayed out of bed greater then an hour; completed his bath and returned him to bed; abdomine resolved of distention and he feels better; without acute distress; repeat cdiff is negative.

## 2013-09-18 NOTE — Progress Notes (Signed)
PHYSICIAN PROGRESS NOTE  Nicholas [name] SJG:[id number] DOB: 12-28-1940 DOA: 09/09/2013  09/18/2013   PCP: [facility]on[facility]   Assessment/Plan: C. difficile-complete 14 days of vancomycin treatment thru 09/24/13  Leukocytosis: improving, monitor- check CBC in AM- diarrhea resolved, now with constipation  Constipation: adding peri-colace  Acute L1 fracture-Per NS. Pain better controlled. PT/OT ordered, OOB to chair today.   ESRD MWF [facility]-appreciate nephro assist. HD per nephrology team   Afib-not on Coumadin. On Amiodarone 200 daily. Rate Controlled.   Chronic Diastolic HF- MLYY50 % as of ECHO 12/02/12-Euvolemic. Dialysis dependent.   DM 2-SS1 Coverage. 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   Code Status: FULL  Family Communication: none at bedside  Disposition Plan: per primary team  HPI/Subjective: Pt reports bladder feels full and acid reflux.   Objective: Filed Vitals:   09/18/13 0543  BP: 125/91  Pulse: 85  Temp: 98.2 F (36.8 C)  Resp: 18    Intake/Output Summary (Last 24 hours) at 09/18/13 0930 Last data filed at 09/17/13 1700  Gross per 24 hour  Intake    480 ml  Output      0 ml  Net    480 ml   Filed Weights   09/14/13 1209 09/16/13 1517 09/16/13 1927  Weight: 150 lb 9.2 oz (68.3 kg) 154 lb 8.7 oz (70.1 kg) 155 lb 3.3 oz (70.4 kg)    Exam:  General: awake, alert, no distress, cooperative  Cardiovascular: EOMI, frail  Respiratory: s1 s2 no m/r/g  Abdomen: soft, mild distension, suprapubic TTP Skin: no LE edema, Midline chest scar   Data Reviewed: Basic Metabolic Panel:  Recent Labs Lab 09/12/13 0625  09/13/13 0640 09/14/13 0500 09/16/13 1618  NA 134* 138 136* 128*  K 4.0 3.5* 3.8 4.7  CL 91* 94* 94* 87*  CO2 26 27 28 28   GLUCOSE 80 105* 140* 105*  BUN 38* 22 36* 43*  CREATININE 4.15* 3.13* 4.35* 4.70*  CALCIUM 8.4 8.0* 7.8* 8.2*  PHOS 2.5  --  2.6 1.6*   Liver Function Tests:  Recent Labs Lab 09/12/13 0625 09/13/13 0640 09/14/13 0500 09/16/13 1618  AST  --  11  --   --   ALT  --  <5  --   --   ALKPHOS  --  151*  --   --   BILITOT  --  0.3  --   --   PROT  --  5.8*  --   --   ALBUMIN 2.1* 1.9* 1.8* 1.9*   No results found for this basename: LIPASE, AMYLASE,  in the last 168 hours No results found for this basename: AMMONIA,  in the last 168 hours CBC:  Recent Labs Lab 09/12/13 0625 09/13/13 0640 09/14/13 0500 09/16/13 0332  WBC 15.3* 19.7* 23.0* 15.4*  NEUTROABS 12.2* 17.2*  --   --   HGB 10.7* 10.2* 9.3* 9.0*  HCT 34.6* 32.8* 29.8* 29.1*  MCV 94.5 94.0 93.4 94.5  PLT 390 376 386 317   Cardiac Enzymes: No results found for this basename: CKTOTAL, CKMB, CKMBINDEX, TROPONINI,  in the last 168 hours BNP (last 3 results)  Recent Labs  10/20/12 0320  PROBNP 38564.0*   CBG:  Recent  Labs Lab 09/16/13 1153 09/16/13 2131 09/17/13 0700 09/17/13 1140 09/17/13 1617  GLUCAP 199* 133* 122* 147* 130*    Recent Results (from the past 240 hour(s))  CLOSTRIDIUM DIFFICILE BY PCR     Status: Abnormal   Collection Time    09/10/13 12:56 AM      Result Value Range Status   C difficile by pcr POSITIVE (*) NEGATIVE Final   Comment: CRITICAL RESULT CALLED TO, READ BACK BY AND VERIFIED WITH:     E.ANSOMAA,RN 1036 09/10/13 M.CAMPBELL     Studies: No results found.  Scheduled Meds: . allopurinol  100 mg Oral BID  . amiodarone  200 mg Oral Daily  . darbepoetin (ARANESP) injection - DIALYSIS  60 mcg Intravenous Q Mon-HD  . doxercalciferol  2 mcg Intravenous Q M,W,F-HD  . feeding supplement (NEPRO CARB STEADY)  237 mL Oral BID  . feeding supplement (RESOURCE  BREEZE)  1 Container Oral Q24H  . gabapentin  300 mg Oral BID  . Gerhardt's butt cream   Topical TID  . heparin  5,000 Units Subcutaneous Q8H  . insulin aspart  0-9 Units Subcutaneous TID WC  . ipratropium  2 spray Each Nare TID  . lidocaine  1 patch Transdermal Q24H  . midodrine  5 mg Oral 3 times weekly  . multivitamin  1 tablet Oral QHS  . multivitamin with minerals  1 tablet Oral Daily  . sevelamer carbonate  800 mg Oral TID WC  . vancomycin  125 mg Oral QID  . zolpidem  5 mg Oral QHS   Continuous Infusions: . sodium chloride Stopped (09/16/13 0700)   Active Problems:   ESRD (end stage renal disease) on dialysis   DM (diabetes mellitus)   CAD (coronary artery disease)   S/P CABG x 5   Systolic CHF   Leukocytosis   L1 vertebral fracture   Protein-calorie malnutrition, severe  [place] [employer] [phone]. If 7PM-7AM, please contact night-coverage at [email or web address], password [facility] 09/18/2013, 9:30 AM  LOS: 9 days

## 2013-09-19 LAB — IRON AND TIBC
Iron: 18 ug/dL — ABNORMAL LOW (ref 42–135)
Saturation Ratios: 17 % — ABNORMAL LOW (ref 20–55)
TIBC: 103 ug/dL — ABNORMAL LOW (ref 215–435)
UIBC: 85 ug/dL — ABNORMAL LOW (ref 125–400)

## 2013-09-19 LAB — GLUCOSE, CAPILLARY
GLUCOSE-CAPILLARY: 133 mg/dL — AB (ref 70–99)
GLUCOSE-CAPILLARY: 89 mg/dL (ref 70–99)
Glucose-Capillary: 89 mg/dL (ref 70–99)
Glucose-Capillary: 105 mg/dL — ABNORMAL HIGH (ref 70–99)

## 2013-09-19 LAB — RENAL FUNCTION PANEL
Albumin: 1.8 g/dL — ABNORMAL LOW (ref 3.5–5.2)
BUN: 11 mg/dL (ref 6–23)
CHLORIDE: 93 meq/L — AB (ref 96–112)
CO2: 30 mEq/L (ref 19–32)
CREATININE: 2.29 mg/dL — AB (ref 0.50–1.35)
Calcium: 7.6 mg/dL — ABNORMAL LOW (ref 8.4–10.5)
GFR calc Af Amer: 32 mL/min — ABNORMAL LOW (ref >90)
GFR calc non Af Amer: 27 mL/min — ABNORMAL LOW (ref >90)
Glucose, Bld: 113 mg/dL — ABNORMAL HIGH (ref 70–99)
POTASSIUM: 3.5 meq/L — AB (ref 3.7–5.3)
Phosphorus: 2.3 mg/dL (ref 2.3–4.6)
Sodium: 135 mEq/L — ABNORMAL LOW (ref 137–147)

## 2013-09-19 LAB — CBC
HEMATOCRIT: 31.9 % — AB (ref 39.0–52.0)
Hemoglobin: 10.1 g/dL — ABNORMAL LOW (ref 13.0–17.0)
MCH: 29.5 pg (ref 26.0–34.0)
MCHC: 31.7 g/dL (ref 30.0–36.0)
MCV: 93.3 fL (ref 78.0–100.0)
Platelets: 335 10*3/uL (ref 150–400)
RBC: 3.42 MIL/uL — AB (ref 4.22–5.81)
RDW: 17.8 % — ABNORMAL HIGH (ref 11.5–15.5)
WBC: 11.8 10*3/uL — ABNORMAL HIGH (ref 4.0–10.5)

## 2013-09-19 LAB — FERRITIN: Ferritin: 771 ng/mL — ABNORMAL HIGH (ref 22–322)

## 2013-09-19 MED ORDER — MIDODRINE HCL 5 MG PO TABS
ORAL_TABLET | ORAL | Status: AC
  Administered 2013-09-19: 5 mg
  Filled 2013-09-19: qty 1

## 2013-09-19 MED ORDER — DARBEPOETIN ALFA-POLYSORBATE 60 MCG/0.3ML IJ SOLN
INTRAMUSCULAR | Status: AC
  Administered 2013-09-19: 60 ug
  Filled 2013-09-19: qty 0.3

## 2013-09-19 MED ORDER — DOXERCALCIFEROL 4 MCG/2ML IV SOLN
INTRAVENOUS | Status: AC
Start: 2013-09-19 — End: 2013-09-19
  Administered 2013-09-19: 2 ug
  Filled 2013-09-19: qty 2

## 2013-09-19 MED ORDER — DOCUSATE SODIUM 100 MG PO CAPS: 100.0000 mg | ORAL_CAPSULE | Freq: Two times a day (BID) | ORAL | Status: DC | PRN

## 2013-09-19 NOTE — Progress Notes (Signed)
Subjective:  Reports diarrhea over weekend, one episode today, also intermittent back pain.  Objective: Vital signs in last 24 hours: Temp:  [98 F (36.7 C)-99.4 F (37.4 C)] 98 F (36.7 C) (02/02 0558) Pulse Rate:  [91-95] 92 (02/02 0558) Resp:  [18] 18 (02/02 0558) BP: (109-129)/(60-64) 129/64 mmHg (02/02 0558) SpO2:  [99 %] 99 % (02/02 0558) Weight change:   Intake/Output from previous day: 02/01 0701 - 02/02 0700 In: 540 [P.O.:540] Out: 250 [Urine:250]   Lab Results:  Recent Labs  09/19/13 0605  WBC 11.8*  HGB 10.1*  HCT 31.9*  PLT 335   BMET:  Recent Labs  09/16/13 1618  NA 128*  K 4.7  CL 87*  CO2 28  GLUCOSE 105*  BUN 43*  CREATININE 4.70*  CALCIUM 8.2*  ALBUMIN 1.9*   No results found for this basename: PTH,  in the last 72 hours Iron Studies: No results found for this basename: IRON, TIBC, TRANSFERRIN, FERRITIN,  in the last 72 hours  EXAM:  General appearance: Alert, in no apparent distress  Resp: CTA without rales, rhonchi, or wheezes  Cardio: Irregular rhythm without murmur or rub  GI: + BS, soft and nontender  Extremities: No edema  Access: AVF @ LFA with BFR 400 cc/min   Dialysis: MWF [facility]  4h 69kg 2K/2.25 [place] Heparin 3000 LFA AVF  Hectorol 2 Epogen 5000 Venofer 100 mg IV/HD through 1/28  Recent labs: Hgb 10.4, Tsat 15% in December, P 5.0, PTH 296   Assessment/Plan: 1. L1 vertebral fracture - not surgical candidate, working with PT.  2. C diff infection - on Vancomycin PO. 3. ESRD - HD on MWF @ [place], K 4.7.  HD pending today. 4. HTN/Volume - BP 108/59 on midodrine; wt 70.4 kg, UF goal 1.5 L today. 5. Anemia - Hgb 10.1 on Aranesp 60 mcg on Mon, completed series of IV Fe.  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 Hold Renvela. 7. Nutrition - Alb 1.9, now regular diet, vitamin.  8. A-fib - on Amiodarone, no anticoagulation sec to GI bleed 11/84.  9. CAD/Systolic HF - Hx MI/CABG.  10. DM  - Insulin per primary    LOS: 10 days   Haytham Maher 09/19/2013,9:30 AM

## 2013-09-19 NOTE — Progress Notes (Signed)
I have seen and examined this patient and agree with the plan of care [facility] W 09/19/2013, 10:16 AM

## 2013-09-19 NOTE — Progress Notes (Signed)
No issues overnight. Pt is c/o increased diarrhea since yesterday pm. Also c/o some mild muscle spasms. Has been up with PT/OT.  EXAM:  BP 129/64  Pulse 92  Temp(Src) 98 F (36.7 C) (Oral)  Resp 18  Ht 5\' 6"  (1.676 m)  Wt 70.4 kg (155 lb 3.3 oz)  BMI 25.06 kg/m2  SpO2 99%  Awake, alert, oriented  Speech fluent, appropriate  5/5 BUE/BLE   IMPRESSION:  73 y.o. male with L1 chance fx managed non-operatively  PLAN: - Cont mobilization with PT/OT and brace.  - Plan for placement in SNF

## 2013-09-19 NOTE — Progress Notes (Signed)
Physical Therapy Treatment Patient Details Name: Nicholas [name] MRN: [id number] DOB: 29-Nov-1940 Today's Date: 09/19/2013 Time: 0350-0938 PT Time Calculation (min): 23 min  PT Assessment / Plan / Recommendation  History of Present Illness Nicholas [name] is a 73 y.o. male who presents to the emergency department brought in by EMS for persistent back pain after a fall approximately one week prior to Christmas. At that time, he says he was walking when his right foot slipped and he fell backwards onto his right side. Since that time he has had significant low back pain which initially was across his back, but now as described primarily as on the right side of his low back. He denies any radiation into either the left or the right leg, or into the groin. He denies any weakness of the legs, or numbness, or tingling. The patient is a dialysis patient and therefore does not produce urine, however he denies any changes in bowel habits, or incontinence. Plan is for conservative management of L1 fx.   PT Comments   Patient demonstrates modestly improved activity tolerance today but still requires significant assist. Able to perform minimal ambulation in room. Will continue to see and progress as tolerated.  Follow Up Recommendations  SNF           Equipment Recommendations  None recommended by PT       Frequency Min 3X/week   Progress towards PT Goals Progress towards PT goals: Progressing toward goals  Plan Current plan remains appropriate    Precautions / Restrictions Precautions Precautions: Back Precaution Comments: Reviewed back precautions  Required Braces or Orthoses: Spinal Brace Spinal Brace: Lumbar corset;Applied in supine position Restrictions Weight Bearing Restrictions: No   Pertinent Vitals/Pain 10/10 pain reported by patient during mobility    Mobility  Bed Mobility Overal bed mobility: Needs Assistance Bed Mobility: Rolling;Sidelying to Sit;Sit to Sidelying Rolling: Mod  assist Sidelying to sit: Mod assist Sit to sidelying: Mod assist General bed mobility comments: Assist to perform mobility, elevate trunk and rotate hips to EOB, assist for positioning and LE elevation to return to bed Transfers Overall transfer level: Needs assistance Equipment used: Rolling walker (2 wheeled) Transfers: Sit to/from Stand Sit to Stand: Mod assist General transfer comment: Assist for stability and assist to elevate to standing, VCs for upright posture and hand placement Ambulation/Gait Ambulation/Gait assistance: Min assist Ambulation Distance (Feet): 22 Feet Assistive device: Rolling walker (2 wheeled) Gait Pattern/deviations: Step-to pattern;Decreased stride length;Shuffle;Trunk flexed;Narrow base of support Gait velocity: Decreased Gait velocity interpretation: <1.8 ft/sec, indicative of risk for recurrent falls      PT Goals (current goals can now be found in the care plan section) Acute Rehab PT Goals Patient Stated Goal: Pt wants his pain to go away,. PT Goal Formulation: With patient Time For Goal Achievement: 09/22/13 Potential to Achieve Goals: Fair  Visit Information  Last PT Received On: 09/19/13 Assistance Needed: +2 (for safety and chair follow) History of Present Illness: Nicholas [name] is a 73 y.o. male who presents to the emergency department brought in by EMS for persistent back pain after a fall approximately one week prior to Christmas. At that time, he says he was walking when his right foot slipped and he fell backwards onto his right side. Since that time he has had significant low back pain which initially was across his back, but now as described primarily as on the right side of his low back. He denies any radiation into either the left or  the right leg, or into the groin. He denies any weakness of the legs, or numbness, or tingling. The patient is a dialysis patient and therefore does not produce urine, however he denies any changes in bowel  habits, or incontinence. Plan is for conservative management of L1 fx.    Subjective Data  Subjective: I had a rough night Patient Stated Goal: Pt wants his pain to go away,.   Cognition  Cognition Arousal/Alertness: Awake/alert Overall Cognitive Status: Impaired/Different from baseline Area of Impairment: Attention;Memory Memory: Decreased recall of precautions Safety/Judgement: Decreased awareness of safety Problem Solving: Slow processing    Balance  Balance Overall balance assessment: Needs assistance Sitting-balance support: Feet supported Sitting balance-Leahy Scale: Fair Postural control: Posterior lean  End of Session PT - End of Session Equipment Utilized During Treatment: Gait belt;Back brace Activity Tolerance: Patient limited by pain;Patient limited by fatigue Patient left: in bed;with call bell/phone within reach;with bed alarm set Nurse Communication: Mobility status   GP     Duncan Dull 09/19/2013, 1:04 PM Alben Deeds, [place] DPT  [phone]

## 2013-09-19 NOTE — Progress Notes (Signed)
PHYSICIAN PROGRESS NOTE  Nicholas [name] ACZ:[id number] DOB: June 20, 1941 DOA: 09/09/2013  09/19/2013   PCP: Ramonita Lab  Assessment/Plan: C. difficile-complete 14 days of vancomycin treatment thru 09/24/13   Leukocytosis: improving, monitor- check CBC in AM- diarrhea resolved, now with constipation   Constipation: relieved now.   Acute L1 fracture-Per NS. Pain better controlled. PT/OT ordered, OOB to chair today.   ESRD MWF [facility]-appreciate nephro assist. HD per nephrology team   Afib-not on Coumadin. On Amiodarone 200 daily. Rate Controlled.   Chronic Diastolic HF- FUXN23 % as of ECHO 12/02/12-Euvolemic. Dialysis dependent.   DM 2-SS1 Coverage. 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   Code Status: FULL  Family Communication: none at bedside  Disposition Plan: per primary team SNF placement   HPI/Subjective: Pt reports that he had loose stools yesterday but is much better now.   Objective: Filed Vitals:   09/19/13 0930  BP: 108/59  Pulse: 84  Temp: 98.3 F (36.8 C)  Resp: 18    Intake/Output Summary (Last 24 hours) at 09/19/13 1155 Last data filed at 09/18/13 1800  Gross per 24 hour  Intake    420 ml  Output      0 ml  Net    420 ml   Filed Weights   09/14/13 1209 09/16/13 1517 09/16/13 1927  Weight: 150 lb 9.2 oz (68.3 kg) 154 lb 8.7 oz (70.1 kg) 155 lb 3.3 oz (70.4 kg)   Exam:  General: awake, alert, no distress, cooperative  Cardiovascular: EOMI, frail  Respiratory: s1 s2 no m/r/g  Back: persistent pain with palpation  Abdomen: soft, mild distension, suprapubic TTP  Skin: no LE edema, Midline chest scar    Data  Reviewed: Basic Metabolic Panel:  Recent Labs Lab 09/13/13 0640 09/14/13 0500 09/16/13 1618  NA 138 136* 128*  K 3.5* 3.8 4.7  CL 94* 94* 87*  CO2 27 28 28   GLUCOSE 105* 140* 105*  BUN 22 36* 43*  CREATININE 3.13* 4.35* 4.70*  CALCIUM 8.0* 7.8* 8.2*  PHOS  --  2.6 1.6*   Liver Function Tests:  Recent Labs Lab 09/13/13 0640 09/14/13 0500 09/16/13 1618  AST 11  --   --   ALT <5  --   --   ALKPHOS 151*  --   --   BILITOT 0.3  --   --   PROT 5.8*  --   --   ALBUMIN 1.9* 1.8* 1.9*   No results found for this basename: LIPASE, AMYLASE,  in the last 168 hours No results found for this basename: AMMONIA,  in the last 168 hours CBC:  Recent Labs Lab 09/13/13 0640 09/14/13 0500 09/16/13 0332 09/19/13 0605  WBC 19.7* 23.0* 15.4* 11.8*  NEUTROABS 17.2*  --   --   --   HGB 10.2* 9.3* 9.0* 10.1*  HCT 32.8* 29.8* 29.1* 31.9*  MCV 94.0 93.4 94.5 93.3  PLT 376 386 317 335   Cardiac Enzymes: No results found for this basename: CKTOTAL, CKMB, CKMBINDEX, TROPONINI,  in the last 168 hours BNP (last 3 results)  Recent Labs  10/20/12 0320  PROBNP 38564.0*   CBG:  Recent Labs Lab 09/18/13 0632 09/18/13 1142 09/18/13 1709 09/18/13  2142 09/19/13 0636  GLUCAP 112* 121* 108* 103* 89    Recent Results (from the past 240 hour(s))  CLOSTRIDIUM DIFFICILE BY PCR     Status: Abnormal   Collection Time    09/10/13 12:56 AM      Result Value Range Status   C difficile by pcr POSITIVE (*) NEGATIVE Final   Comment: CRITICAL RESULT CALLED TO, READ BACK BY AND VERIFIED WITH:     E.ANSOMAA,RN 1036 09/10/13 M.CAMPBELL  CLOSTRIDIUM DIFFICILE BY PCR     Status: None   Collection Time    09/18/13  2:03 PM      Result Value Range Status   C difficile by pcr NEGATIVE  NEGATIVE Final     Studies: No results found.  Scheduled Meds: . allopurinol  100 mg Oral BID  . amiodarone  200 mg Oral Daily  . darbepoetin (ARANESP) injection - DIALYSIS  60 mcg Intravenous Q Mon-HD  .  doxercalciferol  2 mcg Intravenous Q M,W,F-HD  . feeding supplement (NEPRO CARB STEADY)  237 mL Oral BID  . feeding supplement (RESOURCE BREEZE)  1 Container Oral Q24H  . gabapentin  300 mg Oral BID  . Gerhardt's butt cream   Topical TID  . heparin  5,000 Units Subcutaneous Q8H  . insulin aspart  0-9 Units Subcutaneous TID WC  . ipratropium  2 spray Each Nare TID  . lidocaine  1 patch Transdermal Q24H  . midodrine  5 mg Oral 3 times weekly  . multivitamin  1 tablet Oral QHS  . multivitamin with minerals  1 tablet Oral Daily  . pantoprazole  40 mg Oral Q0600  . vancomycin  125 mg Oral QID  . zolpidem  5 mg Oral QHS   Continuous Infusions: . sodium chloride Stopped (09/16/13 0700)    Active Problems:   ESRD (end stage renal disease) on dialysis   DM (diabetes mellitus)   CAD (coronary artery disease)   S/P CABG x 5   Systolic CHF   Leukocytosis   L1 vertebral fracture   Protein-calorie malnutrition, severe   Unspecified constipation   Adjoa Althouse [employer] [phone]. If 7PM-7AM, please contact night-coverage at [email or web address], password [facility] 09/19/2013, 11:55 AM  LOS: 10 days

## 2013-09-20 ENCOUNTER — Ambulatory Visit: Payer: Medicare Other | Admitting: Vascular Surgery

## 2013-09-20 ENCOUNTER — Other Ambulatory Visit (HOSPITAL_COMMUNITY): Payer: Medicare Other

## 2013-09-20 LAB — GLUCOSE, CAPILLARY
GLUCOSE-CAPILLARY: 85 mg/dL (ref 70–99)
Glucose-Capillary: 124 mg/dL — ABNORMAL HIGH (ref 70–99)

## 2013-09-20 MED ORDER — SACCHAROMYCES BOULARDII 250 MG PO CAPS
250.0000 mg | ORAL_CAPSULE | Freq: Two times a day (BID) | ORAL | Status: DC
  Filled 2013-09-20 (×2): qty 1

## 2013-09-20 MED ORDER — DARBEPOETIN ALFA-POLYSORBATE 60 MCG/0.3ML IJ SOLN: 60.0000 ug | INTRAMUSCULAR | Status: DC

## 2013-09-20 MED ORDER — INSULIN ASPART 100 UNIT/ML ~~LOC~~ SOLN: 0.0000 [IU] | Freq: Three times a day (TID) | SUBCUTANEOUS | Status: DC

## 2013-09-20 MED ORDER — OXYCODONE-ACETAMINOPHEN 10-325 MG PO TABS
1.0000 | ORAL_TABLET | ORAL | Status: DC | PRN
Start: 2013-09-20 — End: 2013-09-25

## 2013-09-20 MED ORDER — DOXERCALCIFEROL 4 MCG/2ML IV SOLN: 2.0000 ug | INTRAVENOUS | Status: DC

## 2013-09-20 MED ORDER — SACCHAROMYCES BOULARDII 250 MG PO CAPS: 250.0000 mg | ORAL_CAPSULE | Freq: Two times a day (BID) | ORAL | Status: AC

## 2013-09-20 MED ORDER — SIMETHICONE 80 MG PO CHEW: 160.0000 mg | CHEWABLE_TABLET | Freq: Four times a day (QID) | ORAL | Status: AC | PRN

## 2013-09-20 MED ORDER — BOOST / RESOURCE BREEZE PO LIQD: 1.0000 | ORAL | Status: DC

## 2013-09-20 MED ORDER — NEPRO/CARBSTEADY PO LIQD: 237.0000 mL | Freq: Two times a day (BID) | ORAL | Status: AC

## 2013-09-20 MED ORDER — GERHARDT'S BUTT CREAM: 1.0000 "application " | TOPICAL_CREAM | Freq: Three times a day (TID) | CUTANEOUS | Status: DC

## 2013-09-20 MED ORDER — BISACODYL 10 MG RE SUPP: 10.0000 mg | Freq: Every day | RECTAL | Status: AC | PRN

## 2013-09-20 MED ORDER — PANTOPRAZOLE SODIUM 40 MG PO TBEC: 40.0000 mg | DELAYED_RELEASE_TABLET | Freq: Every day | ORAL | Status: DC

## 2013-09-20 MED ORDER — RENA-VITE PO TABS: 1.0000 | ORAL_TABLET | Freq: Every day | ORAL | Status: AC

## 2013-09-20 MED ORDER — BISACODYL 10 MG RE SUPP: 10.0000 mg | Freq: Every day | RECTAL | Status: DC | PRN

## 2013-09-20 MED ORDER — VANCOMYCIN 50 MG/ML ORAL SOLUTION: 125.0000 mg | Freq: Four times a day (QID) | ORAL | Status: DC

## 2013-09-20 MED ORDER — DSS 100 MG PO CAPS: 100.0000 mg | ORAL_CAPSULE | Freq: Two times a day (BID) | ORAL | Status: DC | PRN

## 2013-09-20 NOTE — Progress Notes (Signed)
[facility] ROUNDING NOTE   Subjective:   Interval History: abdominal discomfort this am no BM since weekend    Objective:  Vital signs in last 24 hours:  Temp:  [98 F (36.7 C)-99.7 F (37.6 C)] 98 F (36.7 C) (02/03 1007) Pulse Rate:  [55-107] 88 (02/03 1007) Resp:  [18-20] 18 (02/03 1007) BP: (77-121)/(44-92) 111/44 mmHg (02/03 1007) SpO2:  [93 %-98 %] 98 % (02/03 1007) Weight:  [70.6 kg (155 lb 10.3 oz)-71.5 kg (157 lb 10.1 oz)] 70.6 kg (155 lb 10.3 oz) (02/02 1635)  Weight change:  Filed Weights   09/16/13 1927 09/19/13 1235 09/19/13 1635  Weight: 70.4 kg (155 lb 3.3 oz) 71.5 kg (157 lb 10.1 oz) 70.6 kg (155 lb 10.3 oz)    Intake/Output: I/O last 3 completed shifts: In: -  Out: 1000 [Other:1000]   Intake/Output this shift:     General appearance: Alert, in no apparent distress  Resp: CTA without rales, rhonchi, or wheezes  Cardio: Irregular rhythm without murmur or rub  GI: + BS, soft and nontender  Extremities: No edema  Access: AVF @ LFA with BFR 400 cc/min     Basic Metabolic Panel:  Recent Labs Lab 09/14/13 0500 09/16/13 1618 09/19/13 1822  NA 136* 128* 135*  K 3.8 4.7 3.5*  CL 94* 87* 93*  CO2 28 28 30   GLUCOSE 140* 105* 113*  BUN 36* 43* 11  CREATININE 4.35* 4.70* 2.29*  CALCIUM 7.8* 8.2* 7.6*  PHOS 2.6 1.6* 2.3    Liver Function Tests:  Recent Labs Lab 09/14/13 0500 09/16/13 1618 09/19/13 1822  ALBUMIN 1.8* 1.9* 1.8*   No results found for this basename: LIPASE, AMYLASE,  in the last 168 hours No results found for this basename: AMMONIA,  in the last 168 hours  CBC:  Recent Labs Lab 09/14/13 0500 09/16/13 0332 09/19/13 0605  WBC 23.0* 15.4* 11.8*  HGB 9.3* 9.0* 10.1*  HCT 29.8* 29.1* 31.9*  MCV 93.4 94.5 93.3  PLT 386 317 335    Cardiac Enzymes: No results found for this basename: CKTOTAL, CKMB, CKMBINDEX, TROPONINI,  in the last 168 hours  BNP: No components found with this basename: POCBNP,    CBG:  Recent Labs Lab 09/19/13 0636 09/19/13 1136 09/19/13 1721 09/19/13 2212 09/20/13 0650  GLUCAP 89 133* 89 105* 57    Microbiology: Results for orders placed during the hospital encounter of 09/09/13  CLOSTRIDIUM DIFFICILE BY PCR     Status: Abnormal   Collection Time    09/10/13 12:56 AM      Result Value Range Status   C difficile by pcr POSITIVE (*) NEGATIVE Final   Comment: CRITICAL RESULT CALLED TO, READ BACK BY AND VERIFIED WITH:     E.ANSOMAA,RN 1036 09/10/13 M.CAMPBELL  CLOSTRIDIUM DIFFICILE BY PCR     Status: None   Collection Time    09/18/13  2:03 PM      Result Value Range Status   C difficile by pcr NEGATIVE  NEGATIVE Final    Coagulation Studies: No results found for this basename: LABPROT, INR,  in the last 72 hours  Urinalysis: No results found for this basename: COLORURINE, APPERANCEUR, LABSPEC, PHURINE, GLUCOSEU, HGBUR, BILIRUBINUR, KETONESUR, PROTEINUR, UROBILINOGEN, NITRITE, LEUKOCYTESUR,  in the last 72 hours    Imaging: No results found.   Medications:   . sodium chloride Stopped (09/16/13 0700)   . allopurinol  100 mg Oral BID  . amiodarone  200 mg Oral Daily  . darbepoetin (  ARANESP) injection - DIALYSIS  60 mcg Intravenous Q Mon-HD  . doxercalciferol  2 mcg Intravenous Q M,W,F-HD  . feeding supplement (NEPRO CARB STEADY)  237 mL Oral BID  . feeding supplement (RESOURCE BREEZE)  1 Container Oral Q24H  . gabapentin  300 mg Oral BID  . Gerhardt's butt cream   Topical TID  . heparin  5,000 Units Subcutaneous Q8H  . insulin aspart  0-9 Units Subcutaneous TID WC  . ipratropium  2 spray Each Nare TID  . lidocaine  1 patch Transdermal Q24H  . midodrine  5 mg Oral 3 times weekly  . multivitamin  1 tablet Oral QHS  . multivitamin with minerals  1 tablet Oral Daily  . pantoprazole  40 mg Oral Q0600  . saccharomyces boulardii  250 mg Oral BID  . vancomycin  125 mg Oral QID  . zolpidem  5 mg Oral QHS   acetaminophen, bisacodyl,  docusate sodium, morphine injection, oxyCODONE, promethazine, simethicone, traMADol-acetaminophen  Assessment/ Plan:  1. L1 vertebral fracture - not surgical candidate, working with PT.  2. C diff infection - completing vancomycin 3. ESRD - HD on MWF @ [place],  4. HTN/Volume - stable 5. Anemia - Hgb stable on Aranesp 60 mcg on Mon, completed series of IV Fe.  6. Sec HPT -binders held for hypophosphatemia 7. Nutrition - low albumin with acute illness, now regular diet, vitamin.  8. A-fib - on Amiodarone, no anticoagulation sec to GI bleed 11/2681.  9. CAD/Systolic HF - Hx MI/CABG.  10. DM - Insulin per primary     LOS: 11 Fredric Slabach W @TODAY @11 :26 AM

## 2013-09-20 NOTE — Discharge Summary (Signed)
Physician Discharge Summary  Patient ID: Nicholas [name] MRN: [id number] DOB/AGE: Sep 23, 1940 73 y.o.  Admit date: 09/09/2013 Discharge date: 09/20/2013  Admission Diagnoses: L1 Chance Fracture     ESRD     CAD  Discharge Diagnoses:  Active Problems:   ESRD (end stage renal disease) on dialysis   DM (diabetes mellitus)   CAD (coronary artery disease)   S/P CABG x 5   Systolic CHF   Leukocytosis   L1 vertebral fracture   Protein-calorie malnutrition, severe   Unspecified constipation C. Difficile Colitis  Discharged Condition: stable  Hospital Course: Nicholas [name] is a 73yo man admitted with chronic back pain after a fall about 1 month ago. Workup revealed a L1 chance fracture and Ankylosing Spondylitis. Given his significant medical comorbidites, he was not an operative candidate and therefore he was placed in a brace. He did c/o diarrhea and was diagnosed with C. Diff colitis and treated with oral vancomycin. He did receive hemodialysis on his regular schedule. After approximately 7 days of bedrest, he began to ambulate with PT/OT and his brace. His pain was more adequately controlled, however he did continue to require assistance with ambulation, and therefore was placed in a skilled nursing facility.  Consults: nephrology, medicine  Significant Diagnostic Studies: microbiology: C. Diff  Treatments: antibiotics: vancomycin, PT/OT  Discharge Exam: Blood pressure 111/44, pulse 88, temperature 98 F (36.7 C), temperature source Oral, resp. rate 18, height 5\' 6"  (1.676 m), weight 70.6 kg (155 lb 10.3 oz), SpO2 98.00%. Awake, alert, oriented CN grossly intact Good strength BUE/BLE  Disposition: 03-Skilled Nursing Facility   Future Appointments Provider Department Dept Phone   10/11/2013 1:00 PM [facility] [place] [phone]   10/11/2013 2:00 PM Mal Misty, MD [facility] [phone]       Medication List          acetaminophen 500 MG tablet  Commonly known as:  TYLENOL  Take 500 mg by mouth every 4 (four) hours as needed (pain).     allopurinol 100 MG tablet  Commonly known as:  ZYLOPRIM  Take 100 mg by mouth 2 (two) times daily.     amiodarone 200 MG tablet  Commonly known as:  PACERONE  Take 200 mg by mouth daily.     aspirin 325 MG tablet  Take 325 mg by mouth daily.     bisacodyl 10 MG suppository  Commonly known as:  DULCOLAX  Place 10 mg rectally daily as needed for mild constipation or moderate constipation.     bisacodyl 10 MG suppository  Commonly known as:  DULCOLAX  Place 1 suppository (10 mg total) rectally daily as needed for moderate constipation (gas pain).     darbepoetin 60 MCG/0.3ML Soln injection  Commonly known as:  ARANESP  Inject 0.3 mLs (60 mcg total) into the vein every Monday with hemodialysis.     DSS 100 MG Caps  Take 100 mg by mouth 2 (two) times daily as needed for mild constipation.     docusate sodium 100 MG capsule  Commonly known as:  COLACE  Take 100 mg by mouth 2 (two) times daily.     doxercalciferol 4 MCG/2ML injection  Commonly known as:  HECTOROL  Inject 1 mL (2 mcg total) into the vein every Monday, Wednesday, and Friday with hemodialysis.     feeding supplement (RESOURCE BREEZE) Liqd  Take 1 Container by mouth daily.     feeding supplement (NEPRO CARB  STEADY) Liqd  Take 237 mLs by mouth 2 (two) times daily.     fish oil-omega-3 fatty acids 1000 MG capsule  Take 1 g by mouth 2 (two) times daily.     gabapentin 300 MG capsule  Commonly known as:  NEURONTIN  Take 1 capsule (300 mg total) by mouth 2 (two) times daily.     Gerhardt's butt cream Crea  Apply 1 application topically 3 (three) times daily.     insulin aspart 100 UNIT/ML injection  Commonly known as:  novoLOG  Inject 0-9 Units into the skin 3 (three) times daily with meals.     insulin aspart protamine- aspart (70-30) 100 UNIT/ML injection  Commonly known as:   NOVOLOG MIX 70/30  Inject 2 Units into the skin 3 (three) times daily with meals.     ipratropium 0.06 % nasal spray  Commonly known as:  ATROVENT  Place 2 sprays into both nostrils 3 (three) times daily.     lidocaine 5 %  Commonly known as:  LIDODERM  Place 1 patch onto the skin daily. Remove & Discard patch within 12 hours or as directed by MD     magnesium hydroxide 400 MG/5ML suspension  Commonly known as:  MILK OF MAGNESIA  Take by mouth every 12 (twelve) hours as needed for mild constipation.     midodrine 5 MG tablet  Commonly known as:  PROAMATINE  Take 5 mg by mouth 3 (three) times a week. Take once daily in the morning on dialysis days (usually on Monday, Wednesday and Friday)     multivitamin Tabs tablet  Take 1 tablet by mouth at bedtime.     multivitamin Tabs tablet  Take 1 tablet by mouth at bedtime.     multivitamin with minerals tablet  Take 1 tablet by mouth daily.     oxyCODONE 5 MG immediate release tablet  Commonly known as:  Oxy IR/ROXICODONE  Take 5-10 mg by mouth every 4 (four) hours as needed for moderate pain or severe pain.     oxyCODONE-acetaminophen 10-325 MG per tablet  Commonly known as:  PERCOCET  Take 1 tablet by mouth every 4 (four) hours as needed for pain.     pantoprazole 40 MG tablet  Commonly known as:  PROTONIX  Take 1 tablet (40 mg total) by mouth daily at 6 (six) AM.     pantoprazole 40 MG tablet  Commonly known as:  PROTONIX  Take 40 mg by mouth daily.     polyethylene glycol packet  Commonly known as:  MIRALAX / GLYCOLAX  Take 17 g by mouth daily.     PREPARATION H EX  Apply 1 application topically every 6 (six) hours as needed (hemorrhoids).     saccharomyces boulardii 250 MG capsule  Commonly known as:  FLORASTOR  Take 1 capsule (250 mg total) by mouth 2 (two) times daily.     senna 8.6 MG Tabs tablet  Commonly known as:  SENOKOT  Take 1 tablet by mouth 2 (two) times daily.     sevelamer carbonate 800 MG tablet   Commonly known as:  RENVELA  Take 800 mg by mouth 3 (three) times daily with meals.     simethicone 80 MG chewable tablet  Commonly known as:  MYLICON  Chew 2 tablets (160 mg total) by mouth 4 (four) times daily as needed for flatulence.     traMADol-acetaminophen 37.5-325 MG per tablet  Commonly known as:  ULTRACET  Take 1 tablet by  mouth 3 (three) times daily as needed (pain).     vancomycin 50 mg/mL oral solution  Commonly known as:  VANCOCIN  Take 2.5 mLs (125 mg total) by mouth 4 (four) times daily.     zolpidem 10 MG tablet  Commonly known as:  AMBIEN  Take 10 mg by mouth at bedtime.         SignedConsuella Lose, C 09/20/2013, 1:19 PM

## 2013-09-20 NOTE — Progress Notes (Signed)
PHYSICIAN PROGRESS NOTE  WINFREY CHILLEMI QZR:[id number] DOB: 22-Feb-1941 DOA: 09/09/2013  09/20/2013   PCP: [facility]  Assessment/Plan: C. difficile-complete 14 days of vancomycin treatment thru 09/24/13, add probiotics  Leukocytosis: improving, monitor- check CBC in AM- diarrhea resolved, now with constipation   Constipation: ordered dulcolax suppository today   Acute L1 fracture-Per NS. Pain better controlled. PT/OT ordered, OOB to chair. Needs SNF placement.   ESRD [facility]-appreciate nephro assist. HD per nephrology team   Afib-not on Coumadin. On Amiodarone 200 daily. Rate Controlled.   Chronic Diastolic HF- [id number] % as of ECHO 12/02/12-Euvolemic. Dialysis dependent.   DM 2-SS1 Coverage. 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   Code Status: FULL  Family Communication: none at bedside  Disposition Plan: per primary team SNF placement   HPI/Subjective: Pt reports that he has gas pain and a distended belly  Objective: Filed Vitals:   09/20/13 0937  BP: 102/54  Pulse: 84  Temp:   Resp:     Intake/Output Summary (Last 24 hours) at 09/20/13 0949 Last data filed at 09/19/13 1635  Gross per 24 hour  Intake      0 ml  Output   1000 ml  Net  -1000 ml   Filed Weights   09/16/13 1927 09/19/13 1235 09/19/13 1635  Weight: 155 lb 3.3 oz (70.4 kg) 157 lb 10.1 oz (71.5 kg) 155 lb 10.3 oz (70.6 kg)   Exam:  General: awake, alert, no distress, cooperative  Cardiovascular: EOMI, frail  Respiratory: s1 s2 no m/r/g  Back: persistent pain with palpation  Abdomen:  soft, mild distension, normal BS  Skin: no LE edema, Midline chest scar   Data Reviewed: Basic Metabolic Panel:  Recent Labs Lab 09/14/13 0500 09/16/13 1618 09/19/13 1822  NA 136* 128* 135*  K 3.8 4.7 3.5*  CL 94* 87* 93*  CO2 28 28 30   GLUCOSE 140* 105* 113*  BUN 36* 43* 11  CREATININE 4.35* 4.70* 2.29*  CALCIUM 7.8* 8.2* 7.6*  PHOS 2.6 1.6* 2.3   Liver Function Tests:  Recent Labs Lab 09/14/13 0500 09/16/13 1618 09/19/13 1822  ALBUMIN 1.8* 1.9* 1.8*   No results found for this basename: LIPASE, AMYLASE,  in the last 168 hours No results found for this basename: AMMONIA,  in the last 168 hours CBC:  Recent Labs Lab 09/14/13 0500 09/16/13 0332 09/19/13 0605  WBC 23.0* 15.4* 11.8*  HGB 9.3* 9.0* 10.1*  HCT 29.8* 29.1* 31.9*  MCV 93.4 94.5 93.3  PLT 386 317 335   Cardiac Enzymes: No results found for this basename: CKTOTAL, CKMB, CKMBINDEX, TROPONINI,  in the last 168 hours BNP (last 3 results)  Recent Labs  10/20/12 0320  PROBNP 38564.0*   CBG:  Recent Labs Lab 09/19/13 0636 09/19/13 1136 09/19/13 1721 09/19/13 2212 09/20/13 0650  GLUCAP 89 133* 89 105* 85    Recent Results (from the past 240 hour(s))  CLOSTRIDIUM DIFFICILE BY PCR     Status: None   Collection Time    09/18/13  2:03 PM      Result Value Range Status   C difficile by pcr NEGATIVE  NEGATIVE Final     Studies: No results found.  Scheduled Meds: . allopurinol  100 mg Oral BID  . amiodarone  200 mg Oral Daily  . darbepoetin (ARANESP) injection - DIALYSIS  60 mcg Intravenous Q Mon-HD  . doxercalciferol  2 mcg Intravenous Q M,W,F-HD  . feeding supplement (NEPRO CARB STEADY)  237 mL Oral BID  . feeding supplement (RESOURCE BREEZE)  1 Container Oral Q24H  . gabapentin  300 mg Oral BID  . Gerhardt's butt cream   Topical TID  . heparin  5,000 Units Subcutaneous Q8H  . insulin aspart  0-9 Units Subcutaneous TID WC  . ipratropium  2 spray Each Nare TID  . lidocaine  1 patch  Transdermal Q24H  . midodrine  5 mg Oral 3 times weekly  . multivitamin  1 tablet Oral QHS  . multivitamin with minerals  1 tablet Oral Daily  . pantoprazole  40 mg Oral Q0600  . vancomycin  125 mg Oral QID  . zolpidem  5 mg Oral QHS   Continuous Infusions: . sodium chloride Stopped (09/16/13 0700)   Active Problems:   ESRD (end stage renal disease) on dialysis   DM (diabetes mellitus)   CAD (coronary artery disease)   S/P CABG x 5   Systolic CHF   Leukocytosis   L1 vertebral fracture   Protein-calorie malnutrition, severe   Unspecified constipation  Avel Ogawa [employer] [phone]. If 7PM-7AM, please contact night-coverage at [email or web address], password [facility] 09/20/2013, 9:49 AM  LOS: 11 days

## 2013-09-20 NOTE — Progress Notes (Signed)
Discharge orders received. Pain controlled on PO meds, VSS, discharge instructions/education given to patient. Report called to [facility]. Patient transported to SNF via [facility]

## 2013-09-20 NOTE — Clinical Social Work Placement (Signed)
Clinical Social Work Department CLINICAL SOCIAL WORK PLACEMENT NOTE 09/20/2013  Patient:  Nicholas [name], Nicholas [name]  Account Number:  [id number] Admit date:  09/09/2013  Clinical Social Worker:  Raquel Sarna [place], LCSWA  Date/time:  09/16/2013 01:36 PM  Clinical Social Work is seeking post-discharge placement for this patient at the following level of care:   [place]   (*CSW will update this form in Epic as items are completed)   09/16/2013  Patient/family provided with [place] Department of Clinical Social Work's list of facilities offering this level of care within the geographic area requested by the patient (or if unable, by the patient's family).  09/16/2013  Patient/family informed of their freedom to choose among providers that offer the needed level of care, that participate in Medicare, Medicaid or managed care program needed by the patient, have an available bed and are willing to accept the patient.  09/16/2013  Patient/family informed of [facility]' ownership interest in [facility], as well as of the fact that they are under no obligation to receive care at this facility.  PASARR submitted to EDS on  PASARR number received from [place] on 09/16/2013  FL2 transmitted to all facilities in geographic area requested by pt/family on  09/16/2013 FL2 transmitted to all facilities within larger geographic area on   Patient informed that his/her managed care company has contracts with or will negotiate with  certain facilities, including the following:     Patient/family informed of bed offers received:  09/20/2013 Patient chooses bed at [facility] [place] Physician recommends and patient chooses bed at    Patient to be transferred to [place] on  09/20/2013 Patient to be transferred to facility by Ambulance  The following physician request were entered in Epic:   Additional Comments: Per MD patient ready to Dc to  [facility]. RN, patient, and facility notified of DC. RN given number for report. DC packet left on chart. [facility] transport requested for patient.   Liz Beach, [place], [place], [phone]

## 2013-09-20 NOTE — Progress Notes (Signed)
UR COMPLETED  

## 2013-09-25 ENCOUNTER — Emergency Department (HOSPITAL_COMMUNITY): Payer: Medicare Other

## 2013-09-25 ENCOUNTER — Inpatient Hospital Stay (HOSPITAL_COMMUNITY)
Admission: EM | Admit: 2013-09-25 | Discharge: 2013-10-12 | DRG: 329 | Disposition: A | Discharge status: Skilled Nursing Facility | Payer: Medicare Other | Attending: Internal Medicine | Admitting: Internal Medicine

## 2013-09-25 ENCOUNTER — Encounter (HOSPITAL_COMMUNITY): Payer: Self-pay | Admitting: Emergency Medicine

## 2013-09-25 ENCOUNTER — Inpatient Hospital Stay (HOSPITAL_COMMUNITY): Payer: Medicare Other

## 2013-09-25 DIAGNOSIS — C801 Malignant (primary) neoplasm, unspecified: Secondary | ICD-10-CM

## 2013-09-25 DIAGNOSIS — Z9911 Dependence on respirator [ventilator] status: Secondary | ICD-10-CM

## 2013-09-25 DIAGNOSIS — M109 Gout, unspecified: Secondary | ICD-10-CM | POA: Diagnosis present

## 2013-09-25 DIAGNOSIS — J189 Pneumonia, unspecified organism: Secondary | ICD-10-CM | POA: Diagnosis present

## 2013-09-25 DIAGNOSIS — I6529 Occlusion and stenosis of unspecified carotid artery: Secondary | ICD-10-CM

## 2013-09-25 DIAGNOSIS — Z9861 Coronary angioplasty status: Secondary | ICD-10-CM

## 2013-09-25 DIAGNOSIS — I120 Hypertensive chronic kidney disease with stage 5 chronic kidney disease or end stage renal disease: Secondary | ICD-10-CM | POA: Diagnosis present

## 2013-09-25 DIAGNOSIS — K631 Perforation of intestine (nontraumatic): Secondary | ICD-10-CM | POA: Diagnosis present

## 2013-09-25 DIAGNOSIS — E43 Unspecified severe protein-calorie malnutrition: Secondary | ICD-10-CM | POA: Diagnosis present

## 2013-09-25 DIAGNOSIS — R6521 Severe sepsis with septic shock: Secondary | ICD-10-CM

## 2013-09-25 DIAGNOSIS — R141 Gas pain: Secondary | ICD-10-CM

## 2013-09-25 DIAGNOSIS — D62 Acute posthemorrhagic anemia: Secondary | ICD-10-CM | POA: Diagnosis not present

## 2013-09-25 DIAGNOSIS — G8918 Other acute postprocedural pain: Secondary | ICD-10-CM | POA: Diagnosis not present

## 2013-09-25 DIAGNOSIS — C779 Secondary and unspecified malignant neoplasm of lymph node, unspecified: Secondary | ICD-10-CM | POA: Diagnosis present

## 2013-09-25 DIAGNOSIS — C180 Malignant neoplasm of cecum: Secondary | ICD-10-CM

## 2013-09-25 DIAGNOSIS — I2510 Atherosclerotic heart disease of native coronary artery without angina pectoris: Secondary | ICD-10-CM | POA: Diagnosis present

## 2013-09-25 DIAGNOSIS — Z951 Presence of aortocoronary bypass graft: Secondary | ICD-10-CM

## 2013-09-25 DIAGNOSIS — A0472 Enterocolitis due to Clostridium difficile, not specified as recurrent: Secondary | ICD-10-CM | POA: Diagnosis present

## 2013-09-25 DIAGNOSIS — S32019A Unspecified fracture of first lumbar vertebra, initial encounter for closed fracture: Secondary | ICD-10-CM | POA: Diagnosis present

## 2013-09-25 DIAGNOSIS — K5669 Other intestinal obstruction: Secondary | ICD-10-CM | POA: Diagnosis present

## 2013-09-25 DIAGNOSIS — M129 Arthropathy, unspecified: Secondary | ICD-10-CM | POA: Diagnosis present

## 2013-09-25 DIAGNOSIS — E1142 Type 2 diabetes mellitus with diabetic polyneuropathy: Secondary | ICD-10-CM | POA: Diagnosis present

## 2013-09-25 DIAGNOSIS — I779 Disorder of arteries and arterioles, unspecified: Secondary | ICD-10-CM

## 2013-09-25 DIAGNOSIS — T8130XA Disruption of wound, unspecified, initial encounter: Secondary | ICD-10-CM | POA: Diagnosis not present

## 2013-09-25 DIAGNOSIS — Y841 Kidney dialysis as the cause of abnormal reaction of the patient, or of later complication, without mention of misadventure at the time of the procedure: Secondary | ICD-10-CM | POA: Diagnosis not present

## 2013-09-25 DIAGNOSIS — M459 Ankylosing spondylitis of unspecified sites in spine: Secondary | ICD-10-CM | POA: Diagnosis present

## 2013-09-25 DIAGNOSIS — D638 Anemia in other chronic diseases classified elsewhere: Secondary | ICD-10-CM | POA: Diagnosis present

## 2013-09-25 DIAGNOSIS — I252 Old myocardial infarction: Secondary | ICD-10-CM

## 2013-09-25 DIAGNOSIS — Z8249 Family history of ischemic heart disease and other diseases of the circulatory system: Secondary | ICD-10-CM

## 2013-09-25 DIAGNOSIS — K56609 Unspecified intestinal obstruction, unspecified as to partial versus complete obstruction: Secondary | ICD-10-CM | POA: Diagnosis present

## 2013-09-25 DIAGNOSIS — Z889 Allergy status to unspecified drugs, medicaments and biological substances status: Secondary | ICD-10-CM

## 2013-09-25 DIAGNOSIS — Z823 Family history of stroke: Secondary | ICD-10-CM

## 2013-09-25 DIAGNOSIS — I255 Ischemic cardiomyopathy: Secondary | ICD-10-CM

## 2013-09-25 DIAGNOSIS — I2589 Other forms of chronic ischemic heart disease: Secondary | ICD-10-CM | POA: Diagnosis present

## 2013-09-25 DIAGNOSIS — M5459 Other low back pain: Secondary | ICD-10-CM | POA: Diagnosis present

## 2013-09-25 DIAGNOSIS — R112 Nausea with vomiting, unspecified: Secondary | ICD-10-CM

## 2013-09-25 DIAGNOSIS — J449 Chronic obstructive pulmonary disease, unspecified: Secondary | ICD-10-CM | POA: Diagnosis present

## 2013-09-25 DIAGNOSIS — N186 End stage renal disease: Secondary | ICD-10-CM | POA: Diagnosis present

## 2013-09-25 DIAGNOSIS — Z87891 Personal history of nicotine dependence: Secondary | ICD-10-CM

## 2013-09-25 DIAGNOSIS — M810 Age-related osteoporosis without current pathological fracture: Secondary | ICD-10-CM | POA: Diagnosis present

## 2013-09-25 DIAGNOSIS — W19XXXA Unspecified fall, initial encounter: Secondary | ICD-10-CM | POA: Diagnosis present

## 2013-09-25 DIAGNOSIS — Y833 Surgical operation with formation of external stoma as the cause of abnormal reaction of the patient, or of later complication, without mention of misadventure at the time of the procedure: Secondary | ICD-10-CM | POA: Diagnosis not present

## 2013-09-25 DIAGNOSIS — Z79899 Other long term (current) drug therapy: Secondary | ICD-10-CM

## 2013-09-25 DIAGNOSIS — I509 Heart failure, unspecified: Secondary | ICD-10-CM | POA: Diagnosis present

## 2013-09-25 DIAGNOSIS — E1149 Type 2 diabetes mellitus with other diabetic neurological complication: Secondary | ICD-10-CM | POA: Diagnosis present

## 2013-09-25 DIAGNOSIS — I4891 Unspecified atrial fibrillation: Secondary | ICD-10-CM | POA: Diagnosis present

## 2013-09-25 DIAGNOSIS — N058 Unspecified nephritic syndrome with other morphologic changes: Secondary | ICD-10-CM | POA: Diagnosis present

## 2013-09-25 DIAGNOSIS — S32009A Unspecified fracture of unspecified lumbar vertebra, initial encounter for closed fracture: Secondary | ICD-10-CM | POA: Diagnosis present

## 2013-09-25 DIAGNOSIS — J9600 Acute respiratory failure, unspecified whether with hypoxia or hypercapnia: Secondary | ICD-10-CM | POA: Diagnosis not present

## 2013-09-25 DIAGNOSIS — N2581 Secondary hyperparathyroidism of renal origin: Secondary | ICD-10-CM | POA: Diagnosis present

## 2013-09-25 DIAGNOSIS — F329 Major depressive disorder, single episode, unspecified: Secondary | ICD-10-CM | POA: Diagnosis present

## 2013-09-25 DIAGNOSIS — F3289 Other specified depressive episodes: Secondary | ICD-10-CM | POA: Diagnosis present

## 2013-09-25 DIAGNOSIS — I5020 Unspecified systolic (congestive) heart failure: Secondary | ICD-10-CM | POA: Diagnosis present

## 2013-09-25 DIAGNOSIS — Z96649 Presence of unspecified artificial hip joint: Secondary | ICD-10-CM

## 2013-09-25 DIAGNOSIS — I5022 Chronic systolic (congestive) heart failure: Secondary | ICD-10-CM | POA: Diagnosis present

## 2013-09-25 DIAGNOSIS — Z7982 Long term (current) use of aspirin: Secondary | ICD-10-CM

## 2013-09-25 DIAGNOSIS — Z9889 Other specified postprocedural states: Secondary | ICD-10-CM

## 2013-09-25 DIAGNOSIS — I739 Peripheral vascular disease, unspecified: Secondary | ICD-10-CM | POA: Diagnosis present

## 2013-09-25 DIAGNOSIS — K219 Gastro-esophageal reflux disease without esophagitis: Secondary | ICD-10-CM | POA: Diagnosis present

## 2013-09-25 DIAGNOSIS — R142 Eructation: Secondary | ICD-10-CM

## 2013-09-25 DIAGNOSIS — M545 Low back pain: Secondary | ICD-10-CM | POA: Diagnosis present

## 2013-09-25 DIAGNOSIS — A419 Sepsis, unspecified organism: Secondary | ICD-10-CM | POA: Diagnosis not present

## 2013-09-25 DIAGNOSIS — E876 Hypokalemia: Secondary | ICD-10-CM | POA: Diagnosis present

## 2013-09-25 DIAGNOSIS — R6520 Severe sepsis without septic shock: Secondary | ICD-10-CM

## 2013-09-25 DIAGNOSIS — Z794 Long term (current) use of insulin: Secondary | ICD-10-CM

## 2013-09-25 DIAGNOSIS — M949 Disorder of cartilage, unspecified: Secondary | ICD-10-CM

## 2013-09-25 DIAGNOSIS — J4489 Other specified chronic obstructive pulmonary disease: Secondary | ICD-10-CM | POA: Diagnosis present

## 2013-09-25 DIAGNOSIS — T82898A Other specified complication of vascular prosthetic devices, implants and grafts, initial encounter: Secondary | ICD-10-CM | POA: Diagnosis not present

## 2013-09-25 DIAGNOSIS — K560 Paralytic ileus: Secondary | ICD-10-CM | POA: Diagnosis present

## 2013-09-25 DIAGNOSIS — E1169 Type 2 diabetes mellitus with other specified complication: Secondary | ICD-10-CM | POA: Diagnosis present

## 2013-09-25 DIAGNOSIS — Z992 Dependence on renal dialysis: Secondary | ICD-10-CM

## 2013-09-25 DIAGNOSIS — F411 Generalized anxiety disorder: Secondary | ICD-10-CM | POA: Diagnosis present

## 2013-09-25 DIAGNOSIS — R143 Flatulence: Secondary | ICD-10-CM

## 2013-09-25 DIAGNOSIS — G589 Mononeuropathy, unspecified: Secondary | ICD-10-CM | POA: Diagnosis present

## 2013-09-25 DIAGNOSIS — K659 Peritonitis, unspecified: Secondary | ICD-10-CM | POA: Diagnosis present

## 2013-09-25 DIAGNOSIS — K559 Vascular disorder of intestine, unspecified: Secondary | ICD-10-CM | POA: Diagnosis present

## 2013-09-25 DIAGNOSIS — E1129 Type 2 diabetes mellitus with other diabetic kidney complication: Secondary | ICD-10-CM | POA: Diagnosis present

## 2013-09-25 DIAGNOSIS — M899 Disorder of bone, unspecified: Secondary | ICD-10-CM | POA: Diagnosis present

## 2013-09-25 LAB — CBC WITH DIFFERENTIAL/PLATELET
BASOS ABS: 0 10*3/uL (ref 0.0–0.1)
BASOS PCT: 0 % (ref 0–1)
EOS ABS: 0 10*3/uL (ref 0.0–0.7)
Eosinophils Relative: 0 % (ref 0–5)
HCT: 33.8 % — ABNORMAL LOW (ref 39.0–52.0)
Hemoglobin: 10.4 g/dL — ABNORMAL LOW (ref 13.0–17.0)
Lymphocytes Relative: 6 % — ABNORMAL LOW (ref 12–46)
Lymphs Abs: 0.9 10*3/uL (ref 0.7–4.0)
MCH: 29.5 pg (ref 26.0–34.0)
MCHC: 30.8 g/dL (ref 30.0–36.0)
MCV: 96 fL (ref 78.0–100.0)
MONOS PCT: 8 % (ref 3–12)
Monocytes Absolute: 1.1 10*3/uL — ABNORMAL HIGH (ref 0.1–1.0)
NEUTROS PCT: 86 % — AB (ref 43–77)
Neutro Abs: 12.1 10*3/uL — ABNORMAL HIGH (ref 1.7–7.7)
PLATELETS: 367 10*3/uL (ref 150–400)
RBC: 3.52 MIL/uL — ABNORMAL LOW (ref 4.22–5.81)
RDW: 19 % — ABNORMAL HIGH (ref 11.5–15.5)
WBC: 14.1 10*3/uL — ABNORMAL HIGH (ref 4.0–10.5)

## 2013-09-25 LAB — COMPREHENSIVE METABOLIC PANEL
ALBUMIN: 2 g/dL — AB (ref 3.5–5.2)
ALK PHOS: 138 U/L — AB (ref 39–117)
ALT: 6 U/L (ref 0–53)
AST: 11 U/L (ref 0–37)
BILIRUBIN TOTAL: 0.5 mg/dL (ref 0.3–1.2)
BUN: 36 mg/dL — ABNORMAL HIGH (ref 6–23)
CHLORIDE: 94 meq/L — AB (ref 96–112)
CO2: 30 mEq/L (ref 19–32)
Calcium: 8.2 mg/dL — ABNORMAL LOW (ref 8.4–10.5)
Creatinine, Ser: 4.86 mg/dL — ABNORMAL HIGH (ref 0.50–1.35)
GFR calc Af Amer: 13 mL/min — ABNORMAL LOW (ref >90)
GFR calc non Af Amer: 11 mL/min — ABNORMAL LOW (ref >90)
Glucose, Bld: 74 mg/dL (ref 70–99)
POTASSIUM: 3.6 meq/L — AB (ref 3.7–5.3)
SODIUM: 138 meq/L (ref 137–147)
TOTAL PROTEIN: 5.7 g/dL — AB (ref 6.0–8.3)

## 2013-09-25 LAB — CG4 I-STAT (LACTIC ACID): Lactic Acid, Venous: 0.86 mmol/L (ref 0.5–2.2)

## 2013-09-25 MED ORDER — AMIODARONE HCL IN DEXTROSE 360-4.14 MG/200ML-% IV SOLN: 30.0000 mg/h | INTRAVENOUS | Status: DC

## 2013-09-25 MED ORDER — CALCITONIN (SALMON) 200 UNIT/ACT NA SOLN
1.0000 | Freq: Every day | NASAL | Status: DC
  Administered 2013-09-26 – 2013-10-12 (×15): 1 via NASAL
  Filled 2013-09-25 (×3): qty 3.7

## 2013-09-25 MED ORDER — LIDOCAINE HCL 2 % EX GEL
CUTANEOUS | Status: AC
Start: 2013-09-25 — End: 2013-09-26
  Filled 2013-09-25: qty 10

## 2013-09-25 MED ORDER — ACETAMINOPHEN 325 MG PO TABS: 650.0000 mg | ORAL_TABLET | Freq: Four times a day (QID) | ORAL | Status: DC | PRN

## 2013-09-25 MED ORDER — ACETAMINOPHEN 650 MG RE SUPP: 650.0000 mg | Freq: Four times a day (QID) | RECTAL | Status: DC | PRN

## 2013-09-25 MED ORDER — ASPIRIN 300 MG RE SUPP
300.0000 mg | Freq: Every day | RECTAL | Status: DC
  Administered 2013-09-26: 300 mg via RECTAL
  Filled 2013-09-25: qty 1

## 2013-09-25 MED ORDER — VANCOMYCIN HCL IN DEXTROSE 750-5 MG/150ML-% IV SOLN
750.0000 mg | INTRAVENOUS | Status: DC
  Filled 2013-09-25: qty 150

## 2013-09-25 MED ORDER — HYDROMORPHONE HCL PF 1 MG/ML IJ SOLN
0.5000 mg | INTRAMUSCULAR | Status: DC | PRN
  Administered 2013-09-26 – 2013-09-28 (×8): 0.5 mg via INTRAVENOUS
  Filled 2013-09-25 (×9): qty 1

## 2013-09-25 MED ORDER — PIPERACILLIN-TAZOBACTAM 3.375 G IVPB 30 MIN
3.3750 g | Freq: Once | INTRAVENOUS | Status: AC
  Administered 2013-09-25: 3.375 g via INTRAVENOUS
  Filled 2013-09-25: qty 50

## 2013-09-25 MED ORDER — VANCOMYCIN HCL 500 MG IV SOLR
500.0000 mg | Freq: Four times a day (QID) | Status: DC
  Administered 2013-09-26 (×2): 500 mg via RECTAL
  Filled 2013-09-25 (×6): qty 500

## 2013-09-25 MED ORDER — IOHEXOL 300 MG/ML  SOLN
50.0000 mL | Freq: Once | INTRAMUSCULAR | Status: AC | PRN
  Administered 2013-09-25: 50 mL via ORAL

## 2013-09-25 MED ORDER — PROMETHAZINE HCL 25 MG/ML IJ SOLN
12.5000 mg | Freq: Four times a day (QID) | INTRAMUSCULAR | Status: DC | PRN
  Administered 2013-09-27: 12.5 mg via INTRAVENOUS
  Filled 2013-09-25: qty 1

## 2013-09-25 MED ORDER — SODIUM CHLORIDE 0.9 % IV BOLUS (SEPSIS)
500.0000 mL | Freq: Once | INTRAVENOUS | Status: AC
  Administered 2013-09-25: 500 mL via INTRAVENOUS

## 2013-09-25 MED ORDER — ONDANSETRON 4 MG PO TBDP
4.0000 mg | ORAL_TABLET | Freq: Once | ORAL | Status: AC
  Administered 2013-09-25: 4 mg via ORAL
  Filled 2013-09-25: qty 1

## 2013-09-25 MED ORDER — VANCOMYCIN HCL IN DEXTROSE 1-5 GM/200ML-% IV SOLN
1000.0000 mg | Freq: Once | INTRAVENOUS | Status: AC
  Administered 2013-09-25: 1000 mg via INTRAVENOUS
  Filled 2013-09-25: qty 200

## 2013-09-25 MED ORDER — PIPERACILLIN-TAZOBACTAM IN DEX 2-0.25 GM/50ML IV SOLN
2.2500 g | Freq: Three times a day (TID) | INTRAVENOUS | Status: DC
  Administered 2013-09-26 (×2): 2.25 g via INTRAVENOUS
  Filled 2013-09-25 (×4): qty 50

## 2013-09-25 MED ORDER — VANCOMYCIN HCL IN DEXTROSE 750-5 MG/150ML-% IV SOLN
750.0000 mg | Freq: Once | INTRAVENOUS | Status: AC
  Administered 2013-09-26: 750 mg via INTRAVENOUS
  Filled 2013-09-25 (×2): qty 150

## 2013-09-25 MED ORDER — AMIODARONE HCL IN DEXTROSE 360-4.14 MG/200ML-% IV SOLN: 60.0000 mg/h | INTRAVENOUS | Status: DC

## 2013-09-25 MED ORDER — LORAZEPAM 2 MG/ML IJ SOLN
0.5000 mg | Freq: Once | INTRAMUSCULAR | Status: AC
  Administered 2013-09-26: 0.5 mg via INTRAVENOUS
  Filled 2013-09-25: qty 1

## 2013-09-25 MED ORDER — PANTOPRAZOLE SODIUM 40 MG IV SOLR
40.0000 mg | Freq: Every day | INTRAVENOUS | Status: DC
  Administered 2013-09-26: 40 mg via INTRAVENOUS
  Filled 2013-09-25: qty 40

## 2013-09-25 MED ORDER — LORAZEPAM 2 MG/ML IJ SOLN
0.5000 mg | Freq: Once | INTRAMUSCULAR | Status: AC
  Administered 2013-09-25: 0.5 mg via INTRAVENOUS
  Filled 2013-09-25: qty 1

## 2013-09-25 MED ORDER — INSULIN ASPART 100 UNIT/ML ~~LOC~~ SOLN
0.0000 [IU] | SUBCUTANEOUS | Status: DC
  Administered 2013-09-29: 2 [IU] via SUBCUTANEOUS
  Administered 2013-09-30: 1 [IU] via SUBCUTANEOUS

## 2013-09-25 MED ORDER — HEPARIN SODIUM (PORCINE) 5000 UNIT/ML IJ SOLN
5000.0000 [IU] | Freq: Three times a day (TID) | INTRAMUSCULAR | Status: DC
  Administered 2013-09-26 – 2013-09-28 (×8): 5000 [IU] via SUBCUTANEOUS
  Filled 2013-09-25 (×12): qty 1

## 2013-09-25 MED ORDER — DARBEPOETIN ALFA-POLYSORBATE 60 MCG/0.3ML IJ SOLN
60.0000 ug | INTRAMUSCULAR | Status: DC
  Filled 2013-09-25: qty 0.3

## 2013-09-25 NOTE — ED Notes (Signed)
MD at bedside. Hospitalist at bedside. 

## 2013-09-25 NOTE — Progress Notes (Signed)
Nicholas [name] [id number] Admitted to 7G01: 09/25/2013 11:44 PM Attending Provider: Louellen Molder, MD   History:  obtained from the patient.  Pt orientation to unit, room and routine. Information packet given to patient and safety video watched.  Admission INP armband ID verified with patient and in place. Side rails in place, fall risk assessment complete with patient verbalizing understanding of risks associated with falls. Pt verbalizes an understanding of how to use the call bell and to call for help before getting out of bed.   Will cont to monitor and assist as needed.  Velora Mediate, RN 09/25/2013 11:44 PM

## 2013-09-25 NOTE — Consult Note (Signed)
Reason for Consult: abdominal distention, nausea and vomiting, possible bowel obstruction  Referring Physician: Orie Baxendale is an 73 y.o. male.   HPI: patient is a 73 year old male discharge 5 days ago from [facility] following an approximately 2 to three-week hospitalization after a fall in which he sustained a L1 Chance fracture. Due to multiple medical comorbidities he was treated nonoperatively. He was transferred to a skilled nursing facility 5 days ago. He was treated for C. Difficile colitis apparently during that hospitalization. He has chronic constipation. He states that for the past 4-5 days he has had persistent vomiting that has gradually worsened associated with abdominal distention. Unable to keep much of anything down. Vomiting is foul smelling.  He has had a couple of small BMs  2-3 days ago but none since. He denies abdominal pain, just some pressure right before he has to vomit.  He was brought to [facility] from the SNF.  Past Medical History  Diagnosis Date  . Coronary artery disease     a. hx of MI 1992; b. 10/2012 NSTEMI (no cath 2/2 GIB);  c. 11/2012 MV: Inf defect w inf and septal HK (favor prior MI), EF 47%;  d. 12/2012 Cath: Severe 3vd, EF 45-50%;  e. 01/2013 CABG x 5 (LIMA->LAD, VG->D1, VG->OM1->OM2, VG->PDA)  . Diabetes mellitus   . Hypertension   . Arthritis   . Carotid artery disease     a. 01/2013 doppler: >80% RICA stenosis, <93% LICA stenosis;  b. 04/299 R CEA.  . Gastritis 2012 and 09/2011    treated for H Pylori in 12/2011  . Diabetic neuropathy   . Osteoporosis   . COPD (chronic obstructive pulmonary disease)   . Anxiety   . Depression   . ESRD (end stage renal disease)     M-W-Sa  [name]  . Diabetic nephropathy   . GERD (gastroesophageal reflux disease)   . Neuromuscular disorder     peripheral neurotherapy  . Anemia   . A-fib     a. Dx 10/2012, on amio, no anticoagulation 2/2 GIB 10/2012.  [name] GIB (gastrointestinal bleeding)      a. 10/2012, awaiting colonoscopy.    Past Surgical History  Procedure Laterality Date  . Coronary stent placement    . Hip surgery      both hips  . Av fistula placement      Left arm  . Cardiac catheterization    . Joint replacement Bilateral   . Eye surgery Bilateral     cararacts  . Coronary artery bypass graft N/A 02/03/2013    Procedure: CORONARY ARTERY BYPASS GRAFTING (CABG);  Surgeon: Melrose Nakayama, MD;  Location: [place];  Service: Open Heart Surgery;  Laterality: N/A;  . Endarterectomy N/A 02/03/2013    Procedure: ENDARTERECTOMY CAROTID;  Surgeon: Mal Misty, MD;  Location: [facility];  Service: Vascular;  Laterality: N/A;  . Olecranon bursectomy Right 07/28/2013    Procedure: RIGHT ELBOW OLECRANON BURSECTOMY ADVANCEMENT CLOSURE ;  Surgeon: Linna Hoff, MD;  Location: [facility];  Service: Orthopedics;  Laterality: Right;    Family History  Problem Relation Age of Onset  . Heart attack Father 54  . Heart disease Father   . Stroke Mother   . Hypertension Mother   . Other Mother     varicose veins  . Stroke Maternal Grandmother   . Hypertension Brother     Social History:  reports that he quit smoking about 23 years ago. His  smoking use included Cigarettes. He started smoking about 48 years ago. He has a 25 pack-year smoking history. He has never used smokeless tobacco. He reports that he drinks alcohol. He reports that he does not use illicit drugs.  Allergies:  Allergies  Allergen Reactions  . Statins Other (See Comments)    Muscle weakness     Current Facility-Administered Medications  Medication Dose Route Frequency Provider Last Rate Last Dose  . lidocaine (XYLOCAINE) 2 % jelly           . vancomycin (VANCOCIN) IVPB 1000 mg/200 mL premix  1,000 mg Intravenous Once Neta Ehlers, MD 200 mL/hr at 09/25/13 2013 1,000 mg at 09/25/13 2013   Current Outpatient Prescriptions  Medication Sig Dispense Refill  . acetaminophen (TYLENOL) 500 MG tablet Take 500 mg  by mouth every 4 (four) hours as needed (pain).       [name] allopurinol (ZYLOPRIM) 100 MG tablet Take 100 mg by mouth 2 (two) times daily.       [name] amiodarone (PACERONE) 200 MG tablet Take 200 mg by mouth daily.      [name] aspirin 325 MG tablet Take 325 mg by mouth daily.      . bisacodyl (DULCOLAX) 10 MG suppository Place 1 suppository (10 mg total) rectally daily as needed for moderate constipation (gas pain).  12 suppository  0  . calcitonin, salmon, (MIACALCIN/FORTICAL) 200 UNIT/ACT nasal spray Place 1 spray into alternate nostrils daily as needed.      . darbepoetin (ARANESP) 60 MCG/0.3ML SOLN injection Inject 0.3 mLs (60 mcg total) into the vein every Monday with hemodialysis.  4.2 mL  3  . docusate sodium 100 MG CAPS Take 100 mg by mouth 2 (two) times daily as needed for mild constipation.  10 capsule  0  . doxercalciferol (HECTOROL) 4 MCG/2ML injection Inject 1 mL (2 mcg total) into the vein every Monday, Wednesday, and Friday with hemodialysis.  2 mL  3  . feeding supplement, RESOURCE BREEZE, (RESOURCE BREEZE) LIQD Take 1 Container by mouth daily.  30 Container  3  . fish oil-omega-3 fatty acids 1000 MG capsule Take 1 g by mouth 2 (two) times daily.       [name] gabapentin (NEURONTIN) 300 MG capsule Take 1 capsule (300 mg total) by mouth 2 (two) times daily.      [name] HYDROcodone-acetaminophen (NORCO/VICODIN) 5-325 MG per tablet Take 1 tablet by mouth 2 (two) times daily.      . insulin aspart (NOVOLOG) 100 UNIT/ML injection Inject 0-9 Units into the skin 3 (three) times daily with meals.  10 mL  11  . ipratropium (ATROVENT) 0.06 % nasal spray Place 2 sprays into both nostrils 3 (three) times daily.       [name] lidocaine (LIDODERM) 5 % Place 1 patch onto the skin daily. Remove & Discard patch within 12 hours or as directed by MD      . magnesium hydroxide (MILK OF MAGNESIA) 400 MG/5ML suspension Take by mouth every 12 (twelve) hours as needed for mild constipation.      . metoCLOPramide (REGLAN) 5 MG tablet  Take 5 mg by mouth 3 (three) times daily.      . midodrine (PROAMATINE) 5 MG tablet Take 5 mg by mouth 3 (three) times a week. Take once daily in the morning on dialysis days (usually on Monday, Wednesday and Friday)      . multivitamin (RENA-VIT) TABS tablet Take 1 tablet by mouth at bedtime.  30 tablet  3  . Nutritional Supplements (FEEDING SUPPLEMENT, NEPRO CARB STEADY,) LIQD Take 237 mLs by mouth 2 (two) times daily.  30 Can  3  . omeprazole (PRILOSEC) 20 MG capsule Take 20 mg by mouth 2 (two) times daily.      [name] oxyCODONE (OXY IR/ROXICODONE) 5 MG immediate release tablet Take 5-10 mg by mouth every 4 (four) hours as needed for moderate pain or severe pain.      . pantoprazole (PROTONIX) 40 MG tablet Take 40 mg by mouth daily.      . polyethylene glycol (MIRALAX / GLYCOLAX) packet Take 17 g by mouth daily.      . promethazine (PHENERGAN) 25 MG tablet Take 25 mg by mouth every 12 (twelve) hours as needed for nausea or vomiting.      . saccharomyces boulardii (FLORASTOR) 250 MG capsule Take 1 capsule (250 mg total) by mouth 2 (two) times daily.  30 capsule  0  . senna (SENOKOT) 8.6 MG TABS tablet Take 1 tablet by mouth 2 (two) times daily.      . sevelamer carbonate (RENVELA) 800 MG tablet Take 800 mg by mouth 3 (three) times daily with meals.      . simethicone (MYLICON) 80 MG chewable tablet Chew 2 tablets (160 mg total) by mouth 4 (four) times daily as needed for flatulence.  30 tablet  0  . sorbitol 70 % solution Take 15 mLs by mouth daily as needed.      . vancomycin (VANCOCIN) 50 mg/mL oral solution Take 2.5 mLs (125 mg total) by mouth 4 (four) times daily.  50 mL  0  . Witch Hazel (PREPARATION H EX) Apply 1 application topically every 6 (six) hours as needed (hemorrhoids).      . zolpidem (AMBIEN) 10 MG tablet Take 10 mg by mouth at bedtime.         Results for orders placed during the hospital encounter of 09/25/13 (from the past 48 hour(s))  CBC WITH DIFFERENTIAL     Status: Abnormal    Collection Time    09/25/13  4:21 PM      Result Value Range   WBC 14.1 (*) 4.0 - 10.5 K/uL   RBC 3.52 (*) 4.22 - 5.81 MIL/uL   Hemoglobin 10.4 (*) 13.0 - 17.0 g/dL   HCT 33.8 (*) 39.0 - 52.0 %   MCV 96.0  78.0 - 100.0 fL   MCH 29.5  26.0 - 34.0 pg   MCHC 30.8  30.0 - 36.0 g/dL   RDW 19.0 (*) 11.5 - 15.5 %   Platelets 367  150 - 400 K/uL   Neutrophils Relative % 86 (*) 43 - 77 %   Neutro Abs 12.1 (*) 1.7 - 7.7 K/uL   Lymphocytes Relative 6 (*) 12 - 46 %   Lymphs Abs 0.9  0.7 - 4.0 K/uL   Monocytes Relative 8  3 - 12 %   Monocytes Absolute 1.1 (*) 0.1 - 1.0 K/uL   Eosinophils Relative 0  0 - 5 %   Eosinophils Absolute 0.0  0.0 - 0.7 K/uL   Basophils Relative 0  0 - 1 %   Basophils Absolute 0.0  0.0 - 0.1 K/uL  COMPREHENSIVE METABOLIC PANEL     Status: Abnormal   Collection Time    09/25/13  4:21 PM      Result Value Range   Sodium 138  137 - 147 mEq/L   Potassium 3.6 (*) 3.7 - 5.3 mEq/L   Chloride 94 (*)  96 - 112 mEq/L   CO2 30  19 - 32 mEq/L   Glucose, Bld 74  70 - 99 mg/dL   BUN 36 (*) 6 - 23 mg/dL   Creatinine, Ser 4.86 (*) 0.50 - 1.35 mg/dL   Calcium 8.2 (*) 8.4 - 10.5 mg/dL   Total Protein 5.7 (*) 6.0 - 8.3 g/dL   Albumin 2.0 (*) 3.5 - 5.2 g/dL   AST 11  0 - 37 U/L   ALT 6  0 - 53 U/L   Alkaline Phosphatase 138 (*) 39 - 117 U/L   Total Bilirubin 0.5  0.3 - 1.2 mg/dL   GFR calc non Af Amer 11 (*) >90 mL/min   GFR calc Af Amer 13 (*) >90 mL/min   Comment: (NOTE)     The eGFR has been calculated using the CKD EPI equation.     This calculation has not been validated in all clinical situations.     eGFR's persistently <90 mL/min signify possible Chronic Kidney     Disease.  CG4 I-STAT (LACTIC ACID)     Status: None   Collection Time    09/25/13  4:34 PM      Result Value Range   Lactic Acid, Venous 0.86  0.5 - 2.2 mmol/L    Ct Abdomen Pelvis Wo Contrast  09/25/2013   CLINICAL DATA:  Vomiting, abdominal distention, constipation  EXAM: CT ABDOMEN AND PELVIS  WITHOUT CONTRAST  TECHNIQUE: Multidetector CT imaging of the abdomen and pelvis was performed following the standard protocol without intravenous contrast.  COMPARISON:  None.  FINDINGS: There are multiple dilated small bowel loops throughout the abdomen and pelvis. In the right mid abdomen, there is a small bowel loop with the extensive bowel content and crecent air anteriorly; this is more likely due to air surrounding stool and less likely due to pneumatosis. There is bowel wall thickening in decompressed ascending, transverse and descending colon.  The liver, spleen, pancreas are normal. The gallbladder is distended. The adrenal glands are normal. The kidneys are atrophic. There are nonobstructing left kidney stones. There are small high and low density lesions in both kidneys, evaluation is limited without contrast. There is atherosclerosis of the abdominal aorta without aneurysmal dilatation. There is no abdominal lymphadenopathy.  Evaluation is decompressed bladder is limited by metallic artifact from the bilateral hip replacements. There is a small left pleural effusion. There is compression atelectasis of the bilateral posterior lung bases. There are patchy airspace consolidation the of the posterior right upper lobe suspicious for pneumonia.  IMPRESSION: Findings consistent with small bowel obstruction. In the right mid abdomen, there is a small bowel loop with the extensive bowel content and crecent air anteriorly; this is more likely due to air surrounding stool and less likely due to pneumatosis. Clinical correlation with lactose level is recommended.  Colonic bowel wall thickening of ascending, transverse, and descending colon. This is in part due to decompressed colon. However, colitis or ischemic colon are not excluded on this noncontrast exam.  Right upper lobe pneumonia.   Electronically Signed   By: Abelardo Diesel M.D.   On: 09/25/2013 17:56    Review of Systems  Constitutional: Negative for  fever and chills.  Respiratory: Negative.   Cardiovascular: Negative for chest pain and palpitations.  Gastrointestinal: Positive for nausea, vomiting and constipation. Negative for abdominal pain.  Musculoskeletal: Positive for back pain and joint pain.  Neurological: Positive for sensory change.   Blood pressure 116/74, pulse 90, temperature 98.5 F (  36.9 C), temperature source Oral, resp. rate 18, height $RemoveBe'5\' 6"'[id number]$  (1.676 m), weight 163 lb (73.936 kg), SpO2 94.00%. Physical Exam General: Alert, conversant, chronically ill-appearing elderly Caucasian male, in no distress Skin: Warm but pale, no rash or infection. HEENT: No palpable masses or thyromegaly. Sclera nonicteric. Well-healed neck incisionLymph nodes: No cervical, supraclavicular, or inguinal nodes palpable. Lungs: Breath sounds clear and equal without increased work of breathing Cardiovascular: well-healed sternotomy.  Regular rate and rhythm without murmur. 1+ lower extremity edema. Feet seem well perfused no palpable pedal pulses Abdomen: very distended and tympanitic. Bowel sounds are hypoactive. No scars. There is a very tiny reducible umbilical hernia. No inguinal hernias. No appreciable tenderness. No masses appreciated. Extremities: 1+ edema lower extremities . No chronic venous stasis changes. Still present in the left arm. Neurologic: Alert and fully oriented. No gross motor deficits. Decreased sensation in lower extremities stocking distribution   Assessment/Plan: A 73 year old male with multiple significant medical problems including end-stage renal disease, coronary artery disease, peripheral vascular disease, osteoporosis and recent L1 Chance fracture treated nonoperatively and now in a skilled nursing facility with minimal mobilization. He has 4-5 days of progressive abdominal distention and vomiting without significant pain. I've reviewed the CT scan which shows marked dilated small bowel and decompressed colon but I do not  see a transition point in the small bowel. In this clinical setting and with his symptoms( minimal if any abdominal pain) suspect this may be an ileus secondary to narcotics and bed rest and multiple medical problems, although his colon is decompressed. No evidence of tumor on CT. Etiology of a mechanical obstruction is not clear.  His abdomen is benign on exam. The patient needs initially NG decompression and bowel rest and close observation. Minimize narcotics. We will follow.  Devery Murgia T 09/25/2013, 8:29 PM

## 2013-09-25 NOTE — ED Notes (Signed)
MD at bedside. Dr. Hoxworth at bedside.  

## 2013-09-25 NOTE — ED Notes (Signed)
Bed: ZG01 Expected date: 09/25/13 Expected time: 2:38 PM Means of arrival:  Comments: Constipation

## 2013-09-25 NOTE — ED Notes (Signed)
Attempted to call report to [place] at [facility]. RN unable to take report at this time. Will call back.

## 2013-09-25 NOTE — ED Notes (Signed)
Report to [place], Therapist, sports from [place].

## 2013-09-25 NOTE — ED Notes (Signed)
Patient transported to CT 

## 2013-09-25 NOTE — H&P (Addendum)
[facility] History and Physical  Nicholas [name] [id number] DOB: 09/02/1940 DOA: 09/25/2013  Referring physician: ED PCP: Ramonita Lab   Chief Complaint:  Constipation for 3-4 days  Nausea and vomiting for 3 days   HPI:  73 year old male with history of end-stage renal disease on dialysis (M., W., F.) , significant CAD with ischemic cardiomyopathy and CABG in June 2014, EF of 50% as per last echo, diabetes mellitus, hypertension, A. fib on amiodarone (not on anticoagulation secondary to history of GI bleed ), basilar neuropathy, depression, osteoporosis, COPD, who was admitted to [facility] about 2 weeks back for L1 compression fracture. He was managed nonsurgically  by neurosurgery while in the hospital and recommended PT/OT and was discharged to a skilled nursing facility. He was also diagnosed to have C. difficile and was started on oral vancomycin for 2 weeks course which he was supposed to complete yesterday. Patient reports that for 3-4 days  he has not had a bowel movement. His diarrhea had subsided a few days prior to that. He also reports that he was taking at least 3 tablets of oxycodone every day for his back pain since discharge from the hospital. Her last 3 days he has been feeling nauseous and vomiting almost every time he tries to eat. He denies abdominal pain but feels  bloated with his abdomen distended. Patient denies headache, dizziness, fever, chills, chest pain, palpitations, SOB, cough or blood in stool. Denies change in weight or appetite. Denies history of bowel surgery. Patient was given an enema in the skilled nursing facility after which he had a scanty bowel movement and was sent to the ED.  Course in the ED Patient was afebrile, pulse of 90, this 38 of 16, blood pressure of 116/74 mmHg, normal O2 sat on room air. Blood work showed leukocytosis with WBC of 14.1 K., hemoglobin 10.4, hematocrit of 33.8, sodium 138, potassium 3.6, chloride 94, CO2 of 30, BUN of  36 and creatinine 4.86, calcium of 8.2 and was of 74. Lactic acid was less than 1. A CT scan of the abdomen and pelvis without contrast done that suggested of small bowel obstruction with colonic bowel wall thickening of the ascending, transverse and descending colon. Also noted for right upper lobe pneumonia. Patient given 500 cc normal saline, a dose of Zofran and a dose of IV vancomycin and Zosyn for  Pneumonia. [facility] consulted for admission with plan to transfer patient to [facility] given need for dialysis.   Review of Systems:  Constitutional: Denies fever, chills, diaphoresis, appetite change and fatigue.  HEENT: Denies photophobia,  hearing loss, ear pain, congestion, sore throat, rhinorrhea, sneezing, mouth sores, trouble swallowing, neck pain, neck stiffness and tinnitus.   Respiratory: Denies SOB, DOE, cough, chest tightness,  and wheezing.   Cardiovascular: Denies chest pain, palpitations and leg swelling.  Gastrointestinal:nausea, vomiting, abdominal distention, constipation, denies diarrhea, blood in stool  Genitourinary: Patient is anuric Musculoskeletal: Back pain, Denies myalgias  joint swelling, arthralgias  Skin: Denies pallor, rash and wound.  Neurological: Denies dizziness, seizures, syncope, weakness, light-headedness, numbness and headaches.  Psychiatric/Behavioral: Denies confusion, nervousness, sleep disturbance and agitation   Past Medical History  Diagnosis Date  . Coronary artery disease     a. hx of MI 1992; b. 10/2012 NSTEMI (no cath 2/2 GIB);  c. 11/2012 MV: Inf defect w inf and septal HK (favor prior MI), EF 47%;  d. 12/2012 Cath: Severe 3vd, EF 45-50%;  e. 01/2013 CABG x 5 (LIMA->LAD, VG->D1,  VG->OM1->OM2, VG->PDA)  . Diabetes mellitus   . Hypertension   . Arthritis   . Carotid artery disease     a. 01/2013 doppler: >80% RICA stenosis, XX123456 LICA stenosis;  b. [id number] R CEA.  . Gastritis 2012 and 09/2011    treated for H Pylori in 12/2011  . Diabetic  neuropathy   . Osteoporosis   . COPD (chronic obstructive pulmonary disease)   . Anxiety   . Depression   . ESRD (end stage renal disease)     M-W-Sa  [name]  . Diabetic nephropathy   . GERD (gastroesophageal reflux disease)   . Neuromuscular disorder     peripheral neurotherapy  . Anemia   . A-fib     a. Dx 10/2012, on amio, no anticoagulation 2/2 GIB 10/2012.  [name] GIB (gastrointestinal bleeding)     a. 10/2012, awaiting colonoscopy.   Past Surgical History  Procedure Laterality Date  . Coronary stent placement    . Hip surgery      both hips  . Av fistula placement      Left arm  . Cardiac catheterization    . Joint replacement Bilateral   . Eye surgery Bilateral     cararacts  . Coronary artery bypass graft N/A 02/03/2013    Procedure: CORONARY ARTERY BYPASS GRAFTING (CABG);  Surgeon: Melrose Nakayama, MD;  Location: [place];  Service: Open Heart Surgery;  Laterality: N/A;  . Endarterectomy N/A 02/03/2013    Procedure: ENDARTERECTOMY CAROTID;  Surgeon: Mal Misty, MD;  Location: [place];  Service: Vascular;  Laterality: N/A;  . Olecranon bursectomy Right 07/28/2013    Procedure: RIGHT ELBOW OLECRANON BURSECTOMY ADVANCEMENT CLOSURE ;  Surgeon: Linna Hoff, MD;  Location: [place];  Service: Orthopedics;  Laterality: Right;   Social History:  reports that he quit smoking about 23 years ago. His smoking use included Cigarettes. He started smoking about 48 years ago. He has a 25 pack-year smoking history. He has never used smokeless tobacco. He reports that he drinks alcohol. He reports that he does not use illicit drugs.  Allergies  Allergen Reactions  . Statins Other (See Comments)    Muscle weakness     Family History  Problem Relation Age of Onset  . Heart attack Father 70  . Heart disease Father   . Stroke Mother   . Hypertension Mother   . Other Mother     varicose veins  . Stroke Maternal Grandmother   . Hypertension Brother     Prior to Admission  medications   Medication Sig Start Date End Date Taking? Authorizing Provider  acetaminophen (TYLENOL) 500 MG tablet Take 500 mg by mouth every 4 (four) hours as needed (pain).    Yes Historical Provider, MD  allopurinol (ZYLOPRIM) 100 MG tablet Take 100 mg by mouth 2 (two) times daily.    Yes Historical Provider, MD  amiodarone (PACERONE) 200 MG tablet Take 200 mg by mouth daily.   Yes Historical Provider, MD  aspirin 325 MG tablet Take 325 mg by mouth daily.   Yes Historical Provider, MD  bisacodyl (DULCOLAX) 10 MG suppository Place 1 suppository (10 mg total) rectally daily as needed for moderate constipation (gas pain). 09/20/13  Yes Consuella Lose, MD  calcitonin, salmon, (MIACALCIN/FORTICAL) 200 UNIT/ACT nasal spray Place 1 spray into alternate nostrils daily as needed.   Yes Historical Provider, MD  darbepoetin (ARANESP) 60 MCG/0.3ML SOLN injection Inject 0.3 mLs (60 mcg total) into the vein every Monday  with hemodialysis. 09/20/13  Yes Consuella Lose, MD  docusate sodium 100 MG CAPS Take 100 mg by mouth 2 (two) times daily as needed for mild constipation. 09/20/13  Yes Consuella Lose, MD  doxercalciferol (HECTOROL) 4 MCG/2ML injection Inject 1 mL (2 mcg total) into the vein every Monday, Wednesday, and Friday with hemodialysis. 09/20/13  Yes Consuella Lose, MD  feeding supplement, RESOURCE BREEZE, (RESOURCE BREEZE) LIQD Take 1 Container by mouth daily. 09/20/13  Yes Consuella Lose, MD  fish oil-omega-3 fatty acids 1000 MG capsule Take 1 g by mouth 2 (two) times daily.    Yes Historical Provider, MD  gabapentin (NEURONTIN) 300 MG capsule Take 1 capsule (300 mg total) by mouth 2 (two) times daily. 02/15/13  Yes Donielle Liston Alba, PA-C  HYDROcodone-acetaminophen (NORCO/VICODIN) 5-325 MG per tablet Take 1 tablet by mouth 2 (two) times daily.   Yes Historical Provider, MD  insulin aspart (NOVOLOG) 100 UNIT/ML injection Inject 0-9 Units into the skin 3 (three) times daily with meals. 09/20/13   Yes Consuella Lose, MD  ipratropium (ATROVENT) 0.06 % nasal spray Place 2 sprays into both nostrils 3 (three) times daily.    Yes Historical Provider, MD  lidocaine (LIDODERM) 5 % Place 1 patch onto the skin daily. Remove & Discard patch within 12 hours or as directed by MD   Yes Historical Provider, MD  magnesium hydroxide (MILK OF MAGNESIA) 400 MG/5ML suspension Take by mouth every 12 (twelve) hours as needed for mild constipation.   Yes Historical Provider, MD  metoCLOPramide (REGLAN) 5 MG tablet Take 5 mg by mouth 3 (three) times daily.   Yes Historical Provider, MD  midodrine (PROAMATINE) 5 MG tablet Take 5 mg by mouth 3 (three) times a week. Take once daily in the morning on dialysis days (usually on Monday, Wednesday and Friday)   Yes Historical Provider, MD  multivitamin (RENA-VIT) TABS tablet Take 1 tablet by mouth at bedtime. 09/20/13  Yes Consuella Lose, MD  Nutritional Supplements (FEEDING SUPPLEMENT, NEPRO CARB STEADY,) LIQD Take 237 mLs by mouth 2 (two) times daily. 09/20/13  Yes Consuella Lose, MD  omeprazole (PRILOSEC) 20 MG capsule Take 20 mg by mouth 2 (two) times daily.   Yes Historical Provider, MD  oxyCODONE (OXY IR/ROXICODONE) 5 MG immediate release tablet Take 5-10 mg by mouth every 4 (four) hours as needed for moderate pain or severe pain.   Yes Historical Provider, MD  pantoprazole (PROTONIX) 40 MG tablet Take 40 mg by mouth daily.   Yes Historical Provider, MD  polyethylene glycol (MIRALAX / GLYCOLAX) packet Take 17 g by mouth daily.   Yes Historical Provider, MD  promethazine (PHENERGAN) 25 MG tablet Take 25 mg by mouth every 12 (twelve) hours as needed for nausea or vomiting.   Yes Historical Provider, MD  saccharomyces boulardii (FLORASTOR) 250 MG capsule Take 1 capsule (250 mg total) by mouth 2 (two) times daily. 09/20/13  Yes Consuella Lose, MD  senna (SENOKOT) 8.6 MG TABS tablet Take 1 tablet by mouth 2 (two) times daily.   Yes Historical Provider, MD  sevelamer  carbonate (RENVELA) 800 MG tablet Take 800 mg by mouth 3 (three) times daily with meals.   Yes Historical Provider, MD  simethicone (MYLICON) 80 MG chewable tablet Chew 2 tablets (160 mg total) by mouth 4 (four) times daily as needed for flatulence. 09/20/13  Yes Consuella Lose, MD  sorbitol 70 % solution Take 15 mLs by mouth daily as needed.   Yes Historical Provider, MD  vancomycin Zollie Pee)  50 mg/mL oral solution Take 2.5 mLs (125 mg total) by mouth 4 (four) times daily. 09/20/13  Yes Consuella Lose, MD  Witch Hazel (PREPARATION H EX) Apply 1 application topically every 6 (six) hours as needed (hemorrhoids).   Yes Historical Provider, MD  zolpidem (AMBIEN) 10 MG tablet Take 10 mg by mouth at bedtime.   Yes Historical Provider, MD     Physical Exam:  Filed Vitals:   09/25/13 1450 09/25/13 1802 09/25/13 1913 09/25/13 1919  BP: 83/61 90/63 116/74   Pulse: 86 89 90   Temp: 98.1 F (36.7 C)  98.5 F (36.9 C)   TempSrc: Oral  Oral   Resp: 20 16 18    Height:    5\' 6"  (1.676 m)  Weight:    73.936 kg (163 lb)  SpO2: 92% 93% 94%     Constitutional: Vital signs reviewed.  Patient is an elderly male lying in bed in no acute distress  HEENT: no pallor, no icterus, moist oral mucosa, no cervical lymphadenopathy Cardiovascular: RRR, S1 normal, S2 normal, no MRG Chest: Right basilar crackles, no wheezes, rales, or rhonchi Abdominal: Soft. distended, sluggish bowel sounds heard, nontender to palpation  Ext: warm, no edema Neurological: A&O x3, non focal  Labs on Admission:  Basic Metabolic Panel:  Recent Labs Lab 09/19/13 1822 09/25/13 1621  NA 135* 138  K 3.5* 3.6*  CL 93* 94*  CO2 30 30  GLUCOSE 113* 74  BUN 11 36*  CREATININE 2.29* 4.86*  CALCIUM 7.6* 8.2*  PHOS 2.3  --    Liver Function Tests:  Recent Labs Lab 09/19/13 1822 09/25/13 1621  AST  --  11  ALT  --  6  ALKPHOS  --  138*  BILITOT  --  0.5  PROT  --  5.7*  ALBUMIN 1.8* 2.0*   No results found for this  basename: LIPASE, AMYLASE,  in the last 168 hours No results found for this basename: AMMONIA,  in the last 168 hours CBC:  Recent Labs Lab 09/19/13 0605 09/25/13 1621  WBC 11.8* 14.1*  NEUTROABS  --  12.1*  HGB 10.1* 10.4*  HCT 31.9* 33.8*  MCV 93.3 96.0  PLT 335 367   Cardiac Enzymes: No results found for this basename: CKTOTAL, CKMB, CKMBINDEX, TROPONINI,  in the last 168 hours BNP: No components found with this basename: POCBNP,  CBG:  Recent Labs Lab 09/19/13 1136 09/19/13 1721 09/19/13 2212 09/20/13 0650 09/20/13 1150  GLUCAP 133* 89 105* 85 124*    Radiological Exams on Admission: Ct Abdomen Pelvis Wo Contrast  09/25/2013   CLINICAL DATA:  Vomiting, abdominal distention, constipation  EXAM: CT ABDOMEN AND PELVIS WITHOUT CONTRAST  TECHNIQUE: Multidetector CT imaging of the abdomen and pelvis was performed following the standard protocol without intravenous contrast.  COMPARISON:  None.  FINDINGS: There are multiple dilated small bowel loops throughout the abdomen and pelvis. In the right mid abdomen, there is a small bowel loop with the extensive bowel content and crecent air anteriorly; this is more likely due to air surrounding stool and less likely due to pneumatosis. There is bowel wall thickening in decompressed ascending, transverse and descending colon.  The liver, spleen, pancreas are normal. The gallbladder is distended. The adrenal glands are normal. The kidneys are atrophic. There are nonobstructing left kidney stones. There are small high and low density lesions in both kidneys, evaluation is limited without contrast. There is atherosclerosis of the abdominal aorta without aneurysmal dilatation. There is no abdominal  lymphadenopathy.  Evaluation is decompressed bladder is limited by metallic artifact from the bilateral hip replacements. There is a small left pleural effusion. There is compression atelectasis of the bilateral posterior lung bases. There are patchy  airspace consolidation the of the posterior right upper lobe suspicious for pneumonia.  IMPRESSION: Findings consistent with small bowel obstruction. In the right mid abdomen, there is a small bowel loop with the extensive bowel content and crecent air anteriorly; this is more likely due to air surrounding stool and less likely due to pneumatosis. Clinical correlation with lactose level is recommended.  Colonic bowel wall thickening of ascending, transverse, and descending colon. This is in part due to decompressed colon. However, colitis or ischemic colon are not excluded on this noncontrast exam.  Right upper lobe pneumonia.   Electronically Signed   By: Abelardo Diesel M.D.   On: 09/25/2013 17:56    EKG: Pending  Assessment/Plan  Principal Problem:   Small bowel obstruction Admit to telemetry -Most likely cause would be ileus secondary to narcotics (patient reports taking at least 3-4 oxycodone daily for his back pain) versus C. difficile colitis. Given history of A. fib possibility of ischemic colitis would also be placed as a lesser likely differential. -Blood work shows leukocytosis with normal lactic acid. Keep patient n.p.o. week serial abdominal exam and daily abdominal x-rays.  NG tube placement with low suction for bowel decompression. -[place] surgery consulted by ED. Burnis Medin place him on when necessary low-dose Dilaudid for pain.  Active Problems:   HCAP (healthcare-associated pneumonia) Incidental finding on CT scan. Patient denies any shortness of breath or cough. He does have a right lower lobe crackles on exam. Given history of COPD and recent hospitalization we'll treat him for healthcare associated pneumonia with IV vancomycin and Zosyn.  ?C. Difficile colitis Patient was supposed to complete his two-week course of vancomycin yesterday.  however given symptoms of nausea and vomiting for last 3 days I  am not sure he was able to hold his medications down. I will place him on  vancomycin enema for now given acute colitis.    ESRD (end stage renal disease) on dialysis Patient on dialysis M W., F. He is due for dialysis tomorrow. I have informed Dr. Servando Salina about his admission.    L1 vertebral fracture Conservative management. I would place him on low dose when necessary Dilaudid for pain. Continue calcitonin nasal spray PT/OT eval    DM (diabetes mellitus) Place on sliding scale insulin. Monitor glucose closely as patient is n.p.o.    CAD (coronary artery disease)/ s/p CABG, CEA Will order aspirin suppository . Hold other medications as patient is n.p.o.   Ischemic cardiomyopathy Last echo in June 2014 with EF of 50% and grade 1 diastolic dysfunction. Currently euvolemic.  A. fib Currently in sinus. On amiodarone which will be on hold given NPO.[name] Not on anticoagulation secondary to history of GI bleed.    Hypokalemia  Monitor in am labs  Protein calorie malnutrition Patient is on nutrition supplements as outpatient.   Diet: N.p.o.  DVT prophylaxis: sq heparin   Code Status:full code Family Communication: discussed with patient Disposition Plan: Transfer to [facility] on telemetry.  Louellen Molder [facility] Pager [phone]  Total time spent on admission :70 minutes  If 7PM-7AM, please contact night-coverage [email or web address] Password [facility] 09/25/2013, 8:04 PM

## 2013-09-25 NOTE — ED Notes (Signed)
[facility] here to take pt to [facility].

## 2013-09-25 NOTE — Progress Notes (Signed)
ANTIBIOTIC CONSULT NOTE - INITIAL  Pharmacy Consult for Vancomycin/zosyn Indication: pneumonia  Allergies  Allergen Reactions  . Statins Other (See Comments)    Muscle weakness     Patient Measurements: Height: 5\' 6"  (167.6 cm) Weight: 163 lb (73.936 kg) IBW/kg (Calculated) : 63.8 Adjusted Body Weight:   Vital Signs: Temp: 98.5 F (36.9 C) (02/08 1913) Temp src: Oral (02/08 1913) BP: 116/74 mmHg (02/08 1913) Pulse Rate: 90 (02/08 1913) Intake/Output from previous day:   Intake/Output from this shift: Total I/O In: -  Out: 2000 [Emesis/NG output:2000]  Labs:  Recent Labs  09/25/13 1621  WBC 14.1*  HGB 10.4*  PLT 367  CREATININE 4.86*   Estimated Creatinine Clearance: 12.8 ml/min (by C-G formula based on Cr of 4.86). No results found for this basename: VANCOTROUGH, Corlis Leak, VANCORANDOM, GENTTROUGH, GENTPEAK, [place], [place], TOBRAPEAK, TOBRARND, AMIKACINPEAK, AMIKACINTROU, AMIKACIN,  in the last 72 hours   Microbiology: Recent Results (from the past 720 hour(s))  CLOSTRIDIUM DIFFICILE BY PCR     Status: Abnormal   Collection Time    09/10/13 12:56 AM      Result Value Range Status   C difficile by pcr POSITIVE (*) NEGATIVE Final   Comment: CRITICAL RESULT CALLED TO, READ BACK BY AND VERIFIED WITH:     E.ANSOMAA,RN 1036 09/10/13 M.CAMPBELL  CLOSTRIDIUM DIFFICILE BY PCR     Status: None   Collection Time    09/18/13  2:03 PM      Result Value Range Status   C difficile by pcr NEGATIVE  NEGATIVE Final    Medical History: Past Medical History  Diagnosis Date  . Coronary artery disease     a. hx of MI 1992; b. 10/2012 NSTEMI (no cath 2/2 GIB);  c. 11/2012 MV: Inf defect w inf and septal HK (favor prior MI), EF 47%;  d. 12/2012 Cath: Severe 3vd, EF 45-50%;  e. 01/2013 CABG x 5 (LIMA->LAD, VG->D1, VG->OM1->OM2, VG->PDA)  . Diabetes mellitus   . Hypertension   . Arthritis   . Carotid artery disease     a. 01/2013 doppler: >80% RICA stenosis, <58% LICA  stenosis;  b. 0/9983 R CEA.  . Gastritis 2012 and 09/2011    treated for H Pylori in 12/2011  . Diabetic neuropathy   . Osteoporosis   . COPD (chronic obstructive pulmonary disease)   . Anxiety   . Depression   . ESRD (end stage renal disease)     M-W-Sa  [name]  . Diabetic nephropathy   . GERD (gastroesophageal reflux disease)   . Neuromuscular disorder     peripheral neurotherapy  . Anemia   . A-fib     a. Dx 10/2012, on amio, no anticoagulation 2/2 GIB 10/2012.  [name] GIB (gastrointestinal bleeding)     a. 10/2012, awaiting colonoscopy.    Medications:  Anti-infectives   Start     Dose/Rate Route Frequency Ordered Stop   09/26/13 0600  piperacillin-tazobactam (ZOSYN) IVPB 2.25 g     2.25 g 100 mL/hr over 30 Minutes Intravenous 3 times per day 09/25/13 2126     09/25/13 2130  vancomycin (VANCOCIN) IVPB 750 mg/150 ml premix     750 mg 150 mL/hr over 60 Minutes Intravenous  Once 09/25/13 2124     09/25/13 2000  piperacillin-tazobactam (ZOSYN) IVPB 3.375 g     3.375 g 100 mL/hr over 30 Minutes Intravenous  Once 09/25/13 1853 09/25/13 2002   09/25/13 2000  vancomycin (VANCOCIN) IVPB 1000 mg/200 mL premix  1,000 mg 200 mL/hr over 60 Minutes Intravenous  Once 09/25/13 1859 09/25/13 2113     Assessment: Patient with PNA.  First dose of antibiotics already given at [facility] ED.  Zosyn per pharmacy within sign and held orders.  Patient on HD.  Goal of Therapy:  Vancomycin and zosyn per HD dosing  Plan:  Vancomycin 1750mg  (total) iv x1, further dosing for after possible HD Zosyn 2.25gm iv q8hr [facility] and alerted RPh about patient and need for follow up.  Nani Skillern Crowford 09/25/2013,9:29 PM

## 2013-09-25 NOTE — ED Provider Notes (Signed)
CSN: [id number]     Arrival date & time 09/25/13  1441 History   First MD Initiated Contact with Patient 09/25/13 1524     Chief Complaint  Patient presents with  . Constipation   (Consider location/radiation/quality/duration/timing/severity/associated sxs/prior Treatment) Patient is a 73 y.o. male presenting with constipation. The history is provided by the patient and a relative. No language interpreter was used.  Constipation Severity:  Moderate Time since last bowel movement:  3 hours (3-5 days prior to last BM) Timing:  Sporadic Progression:  Waxing and waning Chronicity:  New Context: medication and narcotics   Stool description:  Hard Unusual stool frequency:  Daily Relieved by:  Enemas Worsened by:  Narcotic pain medications Ineffective treatments:  Miralax and stool softeners Associated symptoms: nausea and vomiting   Associated symptoms: no abdominal pain, no back pain, no diarrhea, no dysuria, no fever, no flatus and no hematochezia   Vomiting:    Quality:  Stomach contents   Number of occurrences:  5-10 daily for about 1 week   Severity:  Moderate   Duration:  1 week   Timing:  Intermittent   Progression:  Unchanged Risk factors: recent illness (recent L1 fracture)     Past Medical History  Diagnosis Date  . Coronary artery disease     a. hx of MI 1992; b. 10/2012 NSTEMI (no cath 2/2 GIB);  c. 11/2012 MV: Inf defect w inf and septal HK (favor prior MI), EF 47%;  d. 12/2012 Cath: Severe 3vd, EF 45-50%;  e. 01/2013 CABG x 5 (LIMA->LAD, VG->D1, VG->OM1->OM2, VG->PDA)  . Diabetes mellitus   . Hypertension   . Arthritis   . Carotid artery disease     a. 01/2013 doppler: >80% RICA stenosis, [id number] LICA stenosis;  b. [id number] R CEA.  . Gastritis 2012 and 09/2011    treated for H Pylori in 12/2011  . Diabetic neuropathy   . Osteoporosis   . COPD (chronic obstructive pulmonary disease)   . Anxiety   . Depression   . ESRD (end stage renal disease)     M-W-Sa  [name]   . Diabetic nephropathy   . GERD (gastroesophageal reflux disease)   . Neuromuscular disorder     peripheral neurotherapy  . Anemia   . A-fib     a. Dx 10/2012, on amio, no anticoagulation 2/2 GIB 10/2012.  [name] GIB (gastrointestinal bleeding)     a. 10/2012, awaiting colonoscopy.   Past Surgical History  Procedure Laterality Date  . Coronary stent placement    . Hip surgery      both hips  . Av fistula placement      Left arm  . Cardiac catheterization    . Joint replacement Bilateral   . Eye surgery Bilateral     cararacts  . Coronary artery bypass graft N/A 02/03/2013    Procedure: CORONARY ARTERY BYPASS GRAFTING (CABG);  Surgeon: Melrose Nakayama, MD;  Location: [facility];  Service: Open Heart Surgery;  Laterality: N/A;  . Endarterectomy N/A 02/03/2013    Procedure: ENDARTERECTOMY CAROTID;  Surgeon: Mal Misty, MD;  Location: [facility];  Service: Vascular;  Laterality: N/A;  . Olecranon bursectomy Right 07/28/2013    Procedure: RIGHT ELBOW OLECRANON BURSECTOMY ADVANCEMENT CLOSURE ;  Surgeon: Linna Hoff, MD;  Location: [facility];  Service: Orthopedics;  Laterality: Right;   Family History  Problem Relation Age of Onset  . Heart attack Father 67  . Heart disease Father   . Stroke Mother   .  Hypertension Mother   . Other Mother     varicose veins  . Stroke Maternal Grandmother   . Hypertension Brother    History  Substance Use Topics  . Smoking status: Former Smoker -- 1.00 packs/day for 25 years    Types: Cigarettes    Start date: 08/18/1965    Quit date: 08/18/1990  . Smokeless tobacco: Never Used  . Alcohol Use: Yes     Comment: occasionally    Review of Systems  Constitutional: Negative for fever, activity change, appetite change and fatigue.  HENT: Negative for congestion, facial swelling, rhinorrhea and trouble swallowing.   Eyes: Negative for photophobia and pain.  Respiratory: Negative for cough, chest tightness and shortness of breath.   Cardiovascular:  Negative for chest pain and leg swelling.  Gastrointestinal: Positive for nausea, vomiting, constipation and abdominal distention. Negative for abdominal pain, diarrhea, hematochezia and flatus.  Endocrine: Negative for polydipsia and polyuria.  Genitourinary: Negative for dysuria, urgency, decreased urine volume and difficulty urinating.  Musculoskeletal: Negative for back pain and gait problem.  Skin: Negative for color change, rash and wound.  Allergic/Immunologic: Negative for immunocompromised state.  Neurological: Negative for dizziness, facial asymmetry, speech difficulty, weakness, numbness and headaches.  Psychiatric/Behavioral: Negative for confusion, decreased concentration and agitation.    Allergies  Statins  Home Medications   No current outpatient prescriptions on file. BP 106/70  Pulse 87  Temp(Src) 98.9 F (37.2 C) (Oral)  Resp 16  Ht 5\' 6"  (1.676 m)  Wt 227 lb 8.2 oz (103.2 kg)  BMI 36.74 kg/m2  SpO2 97% Physical Exam  Constitutional: He is oriented to person, place, and time. He appears well-developed and well-nourished. No distress.  HENT:  Head: Normocephalic and atraumatic.  Mouth/Throat: No oropharyngeal exudate.  Eyes: Pupils are equal, round, and reactive to light.  Neck: Normal range of motion. Neck supple.  Cardiovascular: Normal rate, regular rhythm and normal heart sounds.  Exam reveals no gallop and no friction rub.   No murmur heard. Pulmonary/Chest: Effort normal and breath sounds normal. No respiratory distress. He has no wheezes. He has no rales.  Abdominal: Soft. He exhibits distension. He exhibits no mass. Bowel sounds are increased. There is tenderness (generalized). There is no rebound and no guarding.  Musculoskeletal: Normal range of motion. He exhibits no edema and no tenderness.  Neurological: He is alert and oriented to person, place, and time.  Skin: Skin is warm and dry.  Psychiatric: He has a normal mood and affect.    ED Course   Procedures (including critical care time) Labs Review Labs Reviewed  CBC WITH DIFFERENTIAL - Abnormal; Notable for the following:    WBC 14.1 (*)    RBC 3.52 (*)    Hemoglobin 10.4 (*)    HCT 33.8 (*)    RDW 19.0 (*)    Neutrophils Relative % 86 (*)    Neutro Abs 12.1 (*)    Lymphocytes Relative 6 (*)    Monocytes Absolute 1.1 (*)    All other components within normal limits  COMPREHENSIVE METABOLIC PANEL - Abnormal; Notable for the following:    Potassium 3.6 (*)    Chloride 94 (*)    BUN 36 (*)    Creatinine, Ser 4.86 (*)    Calcium 8.2 (*)    Total Protein 5.7 (*)    Albumin 2.0 (*)    Alkaline Phosphatase 138 (*)    GFR calc non Af Amer 11 (*)    GFR calc Af Wyvonnia Lora  13 (*)    All other components within normal limits  MRSA PCR SCREENING  BASIC METABOLIC PANEL  CBC  CG4 I-STAT (LACTIC ACID)   Imaging Review Ct Abdomen Pelvis Wo Contrast  09/25/2013   CLINICAL DATA:  Vomiting, abdominal distention, constipation  EXAM: CT ABDOMEN AND PELVIS WITHOUT CONTRAST  TECHNIQUE: Multidetector CT imaging of the abdomen and pelvis was performed following the standard protocol without intravenous contrast.  COMPARISON:  None.  FINDINGS: There are multiple dilated small bowel loops throughout the abdomen and pelvis. In the right mid abdomen, there is a small bowel loop with the extensive bowel content and crecent air anteriorly; this is more likely due to air surrounding stool and less likely due to pneumatosis. There is bowel wall thickening in decompressed ascending, transverse and descending colon.  The liver, spleen, pancreas are normal. The gallbladder is distended. The adrenal glands are normal. The kidneys are atrophic. There are nonobstructing left kidney stones. There are small high and low density lesions in both kidneys, evaluation is limited without contrast. There is atherosclerosis of the abdominal aorta without aneurysmal dilatation. There is no abdominal lymphadenopathy.  Evaluation  is decompressed bladder is limited by metallic artifact from the bilateral hip replacements. There is a small left pleural effusion. There is compression atelectasis of the bilateral posterior lung bases. There are patchy airspace consolidation the of the posterior right upper lobe suspicious for pneumonia.  IMPRESSION: Findings consistent with small bowel obstruction. In the right mid abdomen, there is a small bowel loop with the extensive bowel content and crecent air anteriorly; this is more likely due to air surrounding stool and less likely due to pneumatosis. Clinical correlation with lactose level is recommended.  Colonic bowel wall thickening of ascending, transverse, and descending colon. This is in part due to decompressed colon. However, colitis or ischemic colon are not excluded on this noncontrast exam.  Right upper lobe pneumonia.   Electronically Signed   By: Abelardo Diesel M.D.   On: 09/25/2013 17:56   Dg Abd Portable 1v  09/25/2013   CLINICAL DATA:  Nasogastric tube placement  EXAM: PORTABLE ABDOMEN - 1 VIEW  COMPARISON:  CT abdomen September 25, 2013  FINDINGS: Nasogastric tube is identified with distal tip in the mid stomach. The stomach is air-filled and distended. There are multiple dilated small bowel loops.  IMPRESSION: Naso gastric tube distal tip in the mid stomach. Small bowel obstruction.   Electronically Signed   By: Abelardo Diesel M.D.   On: 09/25/2013 22:56      MDM   1. Small bowel obstruction   2. ESRD (end stage renal disease) on dialysis   3. HCAP (healthcare-associated pneumonia)   4. L1 vertebral fracture    Pt is a 73 y.o. male with Pmhx as above who presents with frequent episodes of emesis about about 1 week after eating, 3-5 days of constipation until having BM today after being given an enema, abdominal distension.  On PE, pt tachycardic to 110's, BP normal (as compared to low triage BP), abdomen distended w/ inc bowel sounds. Rectal with moderate soft stool in  vault.  CT ab/pelvis wo contrast ordered, showed SBO, colonic thickening, & R sided pna.  LA nml.  CBC with elevated WBC.  Vanc/zosyn ordered for HCAP.  [facility] will admit and gen surg will follow.  NTG placed with large amount of fluid returned.           Neta Ehlers, MD 09/26/13 0010

## 2013-09-25 NOTE — ED Notes (Signed)
MD at bedside. Dr. Docherty at bedside.  

## 2013-09-25 NOTE — ED Notes (Signed)
[facility] called for transport of pt to [facility].  

## 2013-09-25 NOTE — ED Notes (Signed)
EMS were told by staff at [facility] that pt. Has not had a b.m. Since his arrival there Tues. 09-20-2013.  He was sent there for rehab. D/t recent lumbar fracture.  Currently he is awake, alert, oreinted x 4 and in no distress.  He has dialysis shunt in left upper arm--due for dialysis tomorrow.

## 2013-09-26 ENCOUNTER — Inpatient Hospital Stay (HOSPITAL_COMMUNITY): Payer: Medicare Other

## 2013-09-26 LAB — GLUCOSE, CAPILLARY
GLUCOSE-CAPILLARY: 95 mg/dL (ref 70–99)
GLUCOSE-CAPILLARY: 93 mg/dL (ref 70–99)
GLUCOSE-CAPILLARY: 87 mg/dL (ref 70–99)
Glucose-Capillary: 93 mg/dL (ref 70–99)
Glucose-Capillary: 105 mg/dL — ABNORMAL HIGH (ref 70–99)
Glucose-Capillary: 98 mg/dL (ref 70–99)

## 2013-09-26 LAB — CBC
HCT: 33.1 % — ABNORMAL LOW (ref 39.0–52.0)
Hemoglobin: 10.2 g/dL — ABNORMAL LOW (ref 13.0–17.0)
MCH: 29.5 pg (ref 26.0–34.0)
MCHC: 30.8 g/dL (ref 30.0–36.0)
MCV: 95.7 fL (ref 78.0–100.0)
Platelets: 327 10*3/uL (ref 150–400)
RBC: 3.46 MIL/uL — ABNORMAL LOW (ref 4.22–5.81)
RDW: 18.7 % — ABNORMAL HIGH (ref 11.5–15.5)
WBC: 16.3 10*3/uL — ABNORMAL HIGH (ref 4.0–10.5)

## 2013-09-26 LAB — BASIC METABOLIC PANEL
BUN: 43 mg/dL — ABNORMAL HIGH (ref 6–23)
CHLORIDE: 94 meq/L — AB (ref 96–112)
CO2: 26 mEq/L (ref 19–32)
Calcium: 7.7 mg/dL — ABNORMAL LOW (ref 8.4–10.5)
Creatinine, Ser: 5.34 mg/dL — ABNORMAL HIGH (ref 0.50–1.35)
GFR calc non Af Amer: 10 mL/min — ABNORMAL LOW (ref >90)
GFR, EST AFRICAN AMERICAN: 11 mL/min — AB (ref >90)
GLUCOSE: 91 mg/dL (ref 70–99)
POTASSIUM: 4.2 meq/L (ref 3.7–5.3)
Sodium: 137 mEq/L (ref 137–147)

## 2013-09-26 LAB — MRSA PCR SCREENING: MRSA by PCR: NEGATIVE

## 2013-09-26 MED ORDER — VANCOMYCIN HCL IN DEXTROSE 1-5 GM/200ML-% IV SOLN: 1000.0000 mg | INTRAVENOUS | Status: DC

## 2013-09-26 MED ORDER — SODIUM CHLORIDE 0.9 % IV SOLN
INTRAVENOUS | Status: DC
  Administered 2013-09-26: 10 mL/h via INTRAVENOUS
  Administered 2013-09-29 (×3): via INTRAVENOUS
  Administered 2013-09-29: 500 mL via INTRAVENOUS
  Administered 2013-10-01: 11:00:00 via INTRAVENOUS
  Administered 2013-10-02 – 2013-10-03 (×2): 20 mL/h via INTRAVENOUS

## 2013-09-26 MED ORDER — VANCOMYCIN HCL IN DEXTROSE 1-5 GM/200ML-% IV SOLN
1000.0000 mg | INTRAVENOUS | Status: DC
  Filled 2013-09-26: qty 200

## 2013-09-26 MED ORDER — DARBEPOETIN ALFA-POLYSORBATE 60 MCG/0.3ML IJ SOLN
60.0000 ug | INTRAMUSCULAR | Status: DC
  Administered 2013-09-27: 60 ug via INTRAVENOUS
  Filled 2013-09-26 (×2): qty 0.3

## 2013-09-26 MED ORDER — DOXERCALCIFEROL 4 MCG/2ML IV SOLN
2.0000 ug | INTRAVENOUS | Status: DC
Start: 2013-09-26 — End: 2013-09-26
  Filled 2013-09-26: qty 2

## 2013-09-26 MED ORDER — METRONIDAZOLE IN NACL 5-0.79 MG/ML-% IV SOLN
500.0000 mg | Freq: Three times a day (TID) | INTRAVENOUS | Status: DC
  Administered 2013-09-26 – 2013-10-03 (×20): 500 mg via INTRAVENOUS
  Filled 2013-09-26 (×24): qty 100

## 2013-09-26 MED ORDER — DOXERCALCIFEROL 4 MCG/2ML IV SOLN
2.0000 ug | INTRAVENOUS | Status: DC
  Administered 2013-09-27 – 2013-10-12 (×6): 2 ug via INTRAVENOUS
  Filled 2013-09-26 (×9): qty 2

## 2013-09-26 NOTE — Progress Notes (Signed)
Will give upsized NGT some time. This may be ileus complicated by inspissated stool. Patient examined and I agree with the assessment and plan  Georganna Skeans, MD, MPH, FACS Pager: [phone]  09/26/2013 11:18 AM

## 2013-09-26 NOTE — Progress Notes (Signed)
[facility] PROGRESS NOTE  Nicholas YOUNGLOVE ONG:[id number] DOB: [birth year] DOA: 09/25/2013 PCP: [facility]  Assessment/Plan:  Principal Problem:   Small bowel obstruction Active Problems:   ESRD (end stage renal disease) on dialysis   DM (diabetes mellitus)   CAD (coronary artery disease)   Chronic Systolic CHF   L1 vertebral fracture Abnormal CT showing infiltrate: no cough. Clinically, no pneumonia. With recent c diff, will d/c zosyn and vancomycin and monitor   Hypokalemia: replete Recent C diff. Change vanc enemas to IV flagyl. D/c protonix  Continue NGT. Appreciate surgery.  Code Status:  full Family Communication:   Disposition Plan:  SNF  HPI/Subjective: Passing gas. Feels better. No cough, wheeze.  Objective: Filed Vitals:   09/26/13 1345  BP: 95/55  Pulse: 77  Temp: 97.8 F (36.6 C)  Resp: 15    Intake/Output Summary (Last 24 hours) at 09/26/13 1719 Last data filed at 09/26/13 1200  Gross per 24 hour  Intake     30 ml  Output   2000 ml  Net  -1970 ml   Filed Weights   09/25/13 1919 09/25/13 2357 09/26/13 1048  Weight: 73.936 kg (163 lb) 103.2 kg (227 lb 8.2 oz) 103.511 kg (228 lb 3.2 oz)    Exam:   General:  Asleep, arousable. Comfortable  HEENT: NG in place. Cannister full. Dry mm  Cardiovascular: RRR without MGR  Respiratory: CTA without WRR  Abdomen: bowel sounds present. Soft, nontender  Ext: no cce  Basic Metabolic Panel:  Recent Labs Lab 09/19/13 1822 09/25/13 1621 09/26/13 0647  NA 135* 138 137  K 3.5* 3.6* 4.2  CL 93* 94* 94*  CO2 30 30 26   GLUCOSE 113* 74 91  BUN 11 36* 43*  CREATININE 2.29* 4.86* 5.34*  CALCIUM 7.6* 8.2* 7.7*  PHOS 2.3  --   --    Liver Function Tests:  Recent Labs Lab 09/19/13 1822 09/25/13 1621  AST  --  11  ALT  --  6  ALKPHOS  --  138*  BILITOT  --  0.5  PROT  --  5.7*  ALBUMIN 1.8* 2.0*   No results found for this basename: LIPASE, AMYLASE,  in the last 168 hours No results  found for this basename: AMMONIA,  in the last 168 hours CBC:  Recent Labs Lab 09/25/13 1621 09/26/13 0647  WBC 14.1* 16.3*  NEUTROABS 12.1*  --   HGB 10.4* 10.2*  HCT 33.8* 33.1*  MCV 96.0 95.7  PLT 367 327   Cardiac Enzymes: No results found for this basename: CKTOTAL, CKMB, CKMBINDEX, TROPONINI,  in the last 168 hours BNP (last 3 results)  Recent Labs  10/20/12 0320  PROBNP 38564.0*   CBG:  Recent Labs Lab 09/25/13 2345 09/26/13 0408 09/26/13 0742 09/26/13 1228 09/26/13 1605  GLUCAP 93 105* 95 98 93    Recent Results (from the past 240 hour(s))  CLOSTRIDIUM DIFFICILE BY PCR     Status: None   Collection Time    09/18/13  2:03 PM      Result Value Range Status   C difficile by pcr NEGATIVE  NEGATIVE Final  MRSA PCR SCREENING     Status: None   Collection Time    09/25/13 11:46 PM      Result Value Range Status   MRSA by PCR NEGATIVE  NEGATIVE Final   Comment:            The GeneXpert MRSA Assay (FDA     approved  for NASAL specimens     only), is one component of a     comprehensive MRSA colonization     surveillance program. It is not     intended to diagnose MRSA     infection nor to guide or     monitor treatment for     MRSA infections.     Studies: Ct Abdomen Pelvis Wo Contrast  09/25/2013   CLINICAL DATA:  Vomiting, abdominal distention, constipation  EXAM: CT ABDOMEN AND PELVIS WITHOUT CONTRAST  TECHNIQUE: Multidetector CT imaging of the abdomen and pelvis was performed following the standard protocol without intravenous contrast.  COMPARISON:  None.  FINDINGS: There are multiple dilated small bowel loops throughout the abdomen and pelvis. In the right mid abdomen, there is a small bowel loop with the extensive bowel content and crecent air anteriorly; this is more likely due to air surrounding stool and less likely due to pneumatosis. There is bowel wall thickening in decompressed ascending, transverse and descending colon.  The liver, spleen,  pancreas are normal. The gallbladder is distended. The adrenal glands are normal. The kidneys are atrophic. There are nonobstructing left kidney stones. There are small high and low density lesions in both kidneys, evaluation is limited without contrast. There is atherosclerosis of the abdominal aorta without aneurysmal dilatation. There is no abdominal lymphadenopathy.  Evaluation is decompressed bladder is limited by metallic artifact from the bilateral hip replacements. There is a small left pleural effusion. There is compression atelectasis of the bilateral posterior lung bases. There are patchy airspace consolidation the of the posterior right upper lobe suspicious for pneumonia.  IMPRESSION: Findings consistent with small bowel obstruction. In the right mid abdomen, there is a small bowel loop with the extensive bowel content and crecent air anteriorly; this is more likely due to air surrounding stool and less likely due to pneumatosis. Clinical correlation with lactose level is recommended.  Colonic bowel wall thickening of ascending, transverse, and descending colon. This is in part due to decompressed colon. However, colitis or ischemic colon are not excluded on this noncontrast exam.  Right upper lobe pneumonia.   Electronically Signed   By: Abelardo Diesel M.D.   On: 09/25/2013 17:56   Dg Abd Portable 1v  09/26/2013   CLINICAL DATA:  Small bowel obstruction, abdominal pain and distention  EXAM: PORTABLE ABDOMEN - 1 VIEW  COMPARISON:  Prior abdominal radiograph 09/25/2013  FINDINGS: Persistent gaseous distension of the stomach and multiple loops of small bowel. The tip of the nasogastric tube projects over the gastric bubble, however the proximal side hole is in the region of the gastroesophageal junction. The degree of small-bowel distention is not significantly changed since the prior study. Maximal small bowel diameter is again 5 cm. No large free air on this single supine radiograph. Surgical changes  of prior median sternotomy and bilateral total hip arthroplasties. The bones are osteopenic. Atherosclerotic vascular calcifications noted in the aorta.  IMPRESSION: 1. No significant interval change in the appearance of high-grade small bowel obstruction. Maximal small bowel diameter is again 5 cm. 2. The proximal side port of the nasogastric tube is in the GE junction. Consider advancing 5 cm for more optimal placement.   Electronically Signed   By: Jacqulynn Cadet M.D.   On: 09/26/2013 07:35   Dg Abd Portable 1v  09/25/2013   CLINICAL DATA:  Nasogastric tube placement  EXAM: PORTABLE ABDOMEN - 1 VIEW  COMPARISON:  CT abdomen September 25, 2013  FINDINGS: Nasogastric tube  is identified with distal tip in the mid stomach. The stomach is air-filled and distended. There are multiple dilated small bowel loops.  IMPRESSION: Naso gastric tube distal tip in the mid stomach. Small bowel obstruction.   Electronically Signed   By: Abelardo Diesel M.D.   On: 09/25/2013 22:56    Scheduled Meds: . aspirin  300 mg Rectal Daily  . calcitonin (salmon)  1 spray Alternating Nares Daily  . [START ON 09/27/2013] darbepoetin  60 mcg Intravenous Q Mon-HD  . [START ON 09/27/2013] doxercalciferol  2 mcg Intravenous Q M,W,F-HD  . heparin  5,000 Units Subcutaneous Q8H  . insulin aspart  0-9 Units Subcutaneous Q4H  . pantoprazole (PROTONIX) IV  40 mg Intravenous Daily  . piperacillin-tazobactam (ZOSYN)  IV  2.25 g Intravenous Q8H  . vancomycin (VANCOCIN) rectal ENEMA  500 mg Rectal Q6H  . [START ON 09/27/2013] vancomycin  1,000 mg Intravenous Q M,W,F-HD   Continuous Infusions:   Time spent: 35 minutes  [facility] Pager [phone]. If 7PM-7AM, please contact night-coverage at [email or web address], password [facility] 09/26/2013, 5:19 PM  LOS: 1 day

## 2013-09-26 NOTE — Progress Notes (Signed)
PT Cancellation Note  Patient Details Name: Nicholas [name] MRN: [id number] DOB: 11/10/1940   Cancelled Treatment:    Reason Eval/Treat Not Completed: Pain limiting ability to participate; reports pain due to NG tube and going to dialysis later.  Requests PT return tomorrow.  Will attempt again in AM.   [facility] 09/26/2013, 9:53 AM

## 2013-09-26 NOTE — Consult Note (Signed)
[facility] Renal Consultation Note    Indication for Consultation:  Management of ESRD/hemodialysis; anemia, hypertension/volume and secondary hyperparathyroidism  HPI: Nicholas [name] is a 73 y.o. male ESRD patient (MWF HD) with past medical history significant for CAD (MI, CABG x5), hypertension, carotid artery disease, DM, and recent admission 1/23 -2/3 for C. Diff colitis and L-1 Chance fracture for which he was not deemed a suitable candidate for surgical intervention. He was discharged to [facility] facility for further rehab. He presented to the [facility] ED with complaints of nausea, vomiting almost hourly, constipation and no bowel movement since his discharge. Imaging was significant for small bowel obstruction as well as a right upper lobe pneumonia.  At the time of this encounter, he is resting in bed, somewhat somnolent but with no complaints other than nasal pain/discomfort associated with the NG tube. He denies belly pain or further nausea. His back pain is controlled and he states that he has not yet begun ambulating at rehab and therefore is not wearing his back brace.  Past Medical History  Diagnosis Date  . Coronary artery disease     a. hx of MI 1992; b. 10/2012 NSTEMI (no cath 2/2 GIB);  c. 11/2012 MV: Inf defect w inf and septal HK (favor prior MI), EF 47%;  d. 12/2012 Cath: Severe 3vd, EF 45-50%;  e. 01/2013 CABG x 5 (LIMA->LAD, VG->D1, VG->OM1->OM2, VG->PDA)  . Diabetes mellitus   . Hypertension   . Arthritis   . Carotid artery disease     a. 01/2013 doppler: >80% RICA stenosis, XX123456 LICA stenosis;  b. [id number] R CEA.  . Gastritis 2012 and 09/2011    treated for H Pylori in 12/2011  . Diabetic neuropathy   . Osteoporosis   . COPD (chronic obstructive pulmonary disease)   . Anxiety   . Depression   . ESRD (end stage renal disease)     M-W-Sa  [name]  . Diabetic nephropathy   . GERD (gastroesophageal reflux disease)   . Neuromuscular  disorder     peripheral neurotherapy  . Anemia   . A-fib     a. Dx 10/2012, on amio, no anticoagulation 2/2 GIB 10/2012.  [name] GIB (gastrointestinal bleeding)     a. 10/2012, awaiting colonoscopy.   Past Surgical History  Procedure Laterality Date  . Coronary stent placement    . Hip surgery      both hips  . Av fistula placement      Left arm  . Cardiac catheterization    . Joint replacement Bilateral   . Eye surgery Bilateral     cararacts  . Coronary artery bypass graft N/A 02/03/2013    Procedure: CORONARY ARTERY BYPASS GRAFTING (CABG);  Surgeon: Melrose Nakayama, MD;  Location: [facility];  Service: Open Heart Surgery;  Laterality: N/A;  . Endarterectomy N/A 02/03/2013    Procedure: ENDARTERECTOMY CAROTID;  Surgeon: Mal Misty, MD;  Location: [facility];  Service: Vascular;  Laterality: N/A;  . Olecranon bursectomy Right 07/28/2013    Procedure: RIGHT ELBOW OLECRANON BURSECTOMY ADVANCEMENT CLOSURE ;  Surgeon: Linna Hoff, MD;  Location: [place] [facility];  Service: Orthopedics;  Laterality: Right;   Family History  Problem Relation Age of Onset  . Heart attack Father 76  . Heart disease Father   . Stroke Mother   . Hypertension Mother   . Other Mother     varicose veins  . Stroke Maternal Grandmother   . Hypertension Brother  Social History:  reports that he quit smoking about 23 years ago. His smoking use included Cigarettes. He started smoking about 48 years ago. He has a 25 pack-year smoking history. He has never used smokeless tobacco. He reports that he drinks alcohol. He reports that he does not use illicit drugs. Allergies  Allergen Reactions  . Statins Other (See Comments)    Muscle weakness    Prior to Admission medications   Medication Sig Start Date End Date Taking? Authorizing Provider  acetaminophen (TYLENOL) 500 MG tablet Take 500 mg by mouth every 4 (four) hours as needed (pain).    Yes Historical Provider, MD  allopurinol (ZYLOPRIM) 100 MG tablet Take 100 mg by  mouth 2 (two) times daily.    Yes Historical Provider, MD  amiodarone (PACERONE) 200 MG tablet Take 200 mg by mouth daily.   Yes Historical Provider, MD  aspirin 325 MG tablet Take 325 mg by mouth daily.   Yes Historical Provider, MD  bisacodyl (DULCOLAX) 10 MG suppository Place 1 suppository (10 mg total) rectally daily as needed for moderate constipation (gas pain). 09/20/13  Yes Consuella Lose, MD  calcitonin, salmon, (MIACALCIN/FORTICAL) 200 UNIT/ACT nasal spray Place 1 spray into alternate nostrils daily as needed.   Yes Historical Provider, MD  darbepoetin (ARANESP) 60 MCG/0.3ML SOLN injection Inject 0.3 mLs (60 mcg total) into the vein every Monday with hemodialysis. 09/20/13  Yes Consuella Lose, MD  docusate sodium 100 MG CAPS Take 100 mg by mouth 2 (two) times daily as needed for mild constipation. 09/20/13  Yes Consuella Lose, MD  doxercalciferol (HECTOROL) 4 MCG/2ML injection Inject 1 mL (2 mcg total) into the vein every Monday, Wednesday, and Friday with hemodialysis. 09/20/13  Yes Consuella Lose, MD  feeding supplement, RESOURCE BREEZE, (RESOURCE BREEZE) LIQD Take 1 Container by mouth daily. 09/20/13  Yes Consuella Lose, MD  fish oil-omega-3 fatty acids 1000 MG capsule Take 1 g by mouth 2 (two) times daily.    Yes Historical Provider, MD  gabapentin (NEURONTIN) 300 MG capsule Take 1 capsule (300 mg total) by mouth 2 (two) times daily. 02/15/13  Yes Donielle Liston Alba, PA-C  HYDROcodone-acetaminophen (NORCO/VICODIN) 5-325 MG per tablet Take 1 tablet by mouth 2 (two) times daily.   Yes Historical Provider, MD  insulin aspart (NOVOLOG) 100 UNIT/ML injection Inject 0-9 Units into the skin 3 (three) times daily with meals. 09/20/13  Yes Consuella Lose, MD  ipratropium (ATROVENT) 0.06 % nasal spray Place 2 sprays into both nostrils 3 (three) times daily.    Yes Historical Provider, MD  lidocaine (LIDODERM) 5 % Place 1 patch onto the skin daily. Remove & Discard patch within 12 hours or as  directed by MD   Yes Historical Provider, MD  magnesium hydroxide (MILK OF MAGNESIA) 400 MG/5ML suspension Take by mouth every 12 (twelve) hours as needed for mild constipation.   Yes Historical Provider, MD  metoCLOPramide (REGLAN) 5 MG tablet Take 5 mg by mouth 3 (three) times daily.   Yes Historical Provider, MD  midodrine (PROAMATINE) 5 MG tablet Take 5 mg by mouth 3 (three) times a week. Take once daily in the morning on dialysis days (usually on Monday, Wednesday and Friday)   Yes Historical Provider, MD  multivitamin (RENA-VIT) TABS tablet Take 1 tablet by mouth at bedtime. 09/20/13  Yes Consuella Lose, MD  Nutritional Supplements (FEEDING SUPPLEMENT, NEPRO CARB STEADY,) LIQD Take 237 mLs by mouth 2 (two) times daily. 09/20/13  Yes Consuella Lose, MD  omeprazole (Cable) 20  MG capsule Take 20 mg by mouth 2 (two) times daily.   Yes Historical Provider, MD  oxyCODONE (OXY IR/ROXICODONE) 5 MG immediate release tablet Take 5-10 mg by mouth every 4 (four) hours as needed for moderate pain or severe pain.   Yes Historical Provider, MD  pantoprazole (PROTONIX) 40 MG tablet Take 40 mg by mouth daily.   Yes Historical Provider, MD  polyethylene glycol (MIRALAX / GLYCOLAX) packet Take 17 g by mouth daily.   Yes Historical Provider, MD  promethazine (PHENERGAN) 25 MG tablet Take 25 mg by mouth every 12 (twelve) hours as needed for nausea or vomiting.   Yes Historical Provider, MD  saccharomyces boulardii (FLORASTOR) 250 MG capsule Take 1 capsule (250 mg total) by mouth 2 (two) times daily. 09/20/13  Yes Consuella Lose, MD  senna (SENOKOT) 8.6 MG TABS tablet Take 1 tablet by mouth 2 (two) times daily.   Yes Historical Provider, MD  sevelamer carbonate (RENVELA) 800 MG tablet Take 800 mg by mouth 3 (three) times daily with meals.   Yes Historical Provider, MD  simethicone (MYLICON) 80 MG chewable tablet Chew 2 tablets (160 mg total) by mouth 4 (four) times daily as needed for flatulence. 09/20/13  Yes  Consuella Lose, MD  sorbitol 70 % solution Take 15 mLs by mouth daily as needed.   Yes Historical Provider, MD  vancomycin (VANCOCIN) 50 mg/mL oral solution Take 2.5 mLs (125 mg total) by mouth 4 (four) times daily. 09/20/13  Yes Consuella Lose, MD  Witch Hazel (PREPARATION H EX) Apply 1 application topically every 6 (six) hours as needed (hemorrhoids).   Yes Historical Provider, MD  zolpidem (AMBIEN) 10 MG tablet Take 10 mg by mouth at bedtime.   Yes Historical Provider, MD   Current Facility-Administered Medications  Medication Dose Route Frequency Provider Last Rate Last Dose  . acetaminophen (TYLENOL) tablet 650 mg  650 mg Oral Q6H PRN Nishant Dhungel, MD       Or  . acetaminophen (TYLENOL) suppository 650 mg  650 mg Rectal Q6H PRN Nishant Dhungel, MD      . aspirin suppository 300 mg  300 mg Rectal Daily Nishant Dhungel, MD   300 mg at 09/26/13 1033  . calcitonin (salmon) (MIACALCIN/FORTICAL) nasal spray 1 spray  1 spray Alternating Nares Daily Nishant Dhungel, MD   1 spray at 09/26/13 1033  . darbepoetin (ARANESP) injection 60 mcg  60 mcg Intravenous Q Mon-HD Nishant Dhungel, MD      . heparin injection 5,000 Units  5,000 Units Subcutaneous Q8H Nishant Dhungel, MD      . HYDROmorphone (DILAUDID) injection 0.5 mg  0.5 mg Intravenous Q4H PRN Nishant Dhungel, MD   0.5 mg at 09/26/13 0837  . insulin aspart (novoLOG) injection 0-9 Units  0-9 Units Subcutaneous Q4H Nishant Dhungel, MD      . pantoprazole (PROTONIX) injection 40 mg  40 mg Intravenous Daily Nishant Dhungel, MD   40 mg at 09/26/13 1033  . piperacillin-tazobactam (ZOSYN) IVPB 2.25 g  2.25 g Intravenous Q8H Nishant Dhungel, MD   2.25 g at 09/26/13 0516  . promethazine (PHENERGAN) injection 12.5 mg  12.5 mg Intravenous Q6H PRN Nishant Dhungel, MD      . vancomycin (VANCOCIN) 500 mg in sodium chloride irrigation 0.9 % 100 mL ENEMA  500 mg Rectal Q6H Nishant Dhungel, MD   500 mg at 09/26/13 0502  . vancomycin (VANCOCIN) IVPB 1000  mg/200 mL premix  1,000 mg Intravenous Q M,W,F-HD Rande Lawman [place], [facility]  Labs: Basic Metabolic Panel:  Recent Labs Lab 09/19/13 1822 09/25/13 1621 09/26/13 0647  NA 135* 138 137  K 3.5* 3.6* 4.2  CL 93* 94* 94*  CO2 30 30 26   GLUCOSE 113* 74 91  BUN 11 36* 43*  CREATININE 2.29* 4.86* 5.34*  CALCIUM 7.6* 8.2* 7.7*  PHOS 2.3  --   --    Liver Function Tests:  Recent Labs Lab 09/19/13 1822 09/25/13 1621  AST  --  11  ALT  --  6  ALKPHOS  --  138*  BILITOT  --  0.5  PROT  --  5.7*  ALBUMIN 1.8* 2.0*    CBC:  Recent Labs Lab 09/25/13 1621 09/26/13 0647  WBC 14.1* 16.3*  NEUTROABS 12.1*  --   HGB 10.4* 10.2*  HCT 33.8* 33.1*  MCV 96.0 95.7  PLT 367 327   CBG:  Recent Labs Lab 09/20/13 0650 09/20/13 1150 09/25/13 2345 09/26/13 0408 09/26/13 0742  GLUCAP 85 124* 93 105* 95   Studies/Results: Ct Abdomen Pelvis Wo Contrast  09/25/2013   CLINICAL DATA:  Vomiting, abdominal distention, constipation  EXAM: CT ABDOMEN AND PELVIS WITHOUT CONTRAST  TECHNIQUE: Multidetector CT imaging of the abdomen and pelvis was performed following the standard protocol without intravenous contrast.  COMPARISON:  None.  FINDINGS: There are multiple dilated small bowel loops throughout the abdomen and pelvis. In the right mid abdomen, there is a small bowel loop with the extensive bowel content and crecent air anteriorly; this is more likely due to air surrounding stool and less likely due to pneumatosis. There is bowel wall thickening in decompressed ascending, transverse and descending colon.  The liver, spleen, pancreas are normal. The gallbladder is distended. The adrenal glands are normal. The kidneys are atrophic. There are nonobstructing left kidney stones. There are small high and low density lesions in both kidneys, evaluation is limited without contrast. There is atherosclerosis of the abdominal aorta without aneurysmal dilatation. There is no abdominal lymphadenopathy.   Evaluation is decompressed bladder is limited by metallic artifact from the bilateral hip replacements. There is a small left pleural effusion. There is compression atelectasis of the bilateral posterior lung bases. There are patchy airspace consolidation the of the posterior right upper lobe suspicious for pneumonia.  IMPRESSION: Findings consistent with small bowel obstruction. In the right mid abdomen, there is a small bowel loop with the extensive bowel content and crecent air anteriorly; this is more likely due to air surrounding stool and less likely due to pneumatosis. Clinical correlation with lactose level is recommended.  Colonic bowel wall thickening of ascending, transverse, and descending colon. This is in part due to decompressed colon. However, colitis or ischemic colon are not excluded on this noncontrast exam.  Right upper lobe pneumonia.   Electronically Signed   By: Abelardo Diesel M.D.   On: 09/25/2013 17:56   Dg Abd Portable 1v  09/26/2013   CLINICAL DATA:  Small bowel obstruction, abdominal pain and distention  EXAM: PORTABLE ABDOMEN - 1 VIEW  COMPARISON:  Prior abdominal radiograph 09/25/2013  FINDINGS: Persistent gaseous distension of the stomach and multiple loops of small bowel. The tip of the nasogastric tube projects over the gastric bubble, however the proximal side hole is in the region of the gastroesophageal junction. The degree of small-bowel distention is not significantly changed since the prior study. Maximal small bowel diameter is again 5 cm. No large free air on this single supine radiograph. Surgical changes of prior median sternotomy and bilateral  total hip arthroplasties. The bones are osteopenic. Atherosclerotic vascular calcifications noted in the aorta.  IMPRESSION: 1. No significant interval change in the appearance of high-grade small bowel obstruction. Maximal small bowel diameter is again 5 cm. 2. The proximal side port of the nasogastric tube is in the GE junction.  Consider advancing 5 cm for more optimal placement.   Electronically Signed   By: Jacqulynn Cadet M.D.   On: 09/26/2013 07:35   Dg Abd Portable 1v  09/25/2013   CLINICAL DATA:  Nasogastric tube placement  EXAM: PORTABLE ABDOMEN - 1 VIEW  COMPARISON:  CT abdomen September 25, 2013  FINDINGS: Nasogastric tube is identified with distal tip in the mid stomach. The stomach is air-filled and distended. There are multiple dilated small bowel loops.  IMPRESSION: Naso gastric tube distal tip in the mid stomach. Small bowel obstruction.   Electronically Signed   By: Abelardo Diesel M.D.   On: 09/25/2013 22:56    ROS: Nose pain 2/2 NG tube. 10 pt ROS asked and answered. All systems neg except as in HPI above.   Physical Exam: Filed Vitals:   09/25/13 2357 09/26/13 0500 09/26/13 1000 09/26/13 1048  BP: 106/70 96/62 85/52    Pulse: 87 86 84   Temp: 98.9 F (37.2 C) 98.6 F (37 C) 97.7 F (36.5 C)   TempSrc: Oral Oral Oral   Resp: 16 16 14    Height: 5\' 6"  (1.676 m)     Weight: 103.2 kg (227 lb 8.2 oz)   103.511 kg (228 lb 3.2 oz)  SpO2: 97% 95% 96%      General: Elderly, somnolent but arouses easily, in no acute distress. Head: Normocephalic, atraumatic, sclera non-icteric, mucus membranes are moist, dentition in poor repair Neck: Supple. JVD not elevated. Lungs: Crackles on right. No appreciable wheezes, rales, or rhonchi. Breathing is unlabored. Heart: RRR with S1 S2. No murmurs, rubs, or gallops appreciated. Abdomen: Soft, non-tender,  Trace distension, + bowel sounds. No rebound/guarding. No obvious abdominal masses. M-S:  deconditioned. Lower extremities:without edema or ischemic changes, no open wounds  Neuro: Alert and oriented X 3. Moves all extremities spontaneously. Psych:  Responds to questions appropriately with a normal affect. Dialysis Access: L AVF + bruit  Dialysis Orders: Center: [facility]  on MWF . EDW 69kg HD Bath 2K/2.25CA  Time 4:00 Heparin 5000u. Access L AVF BFR 400 DFR 800     Hectorol 2 mcg IV/HD Epogen 5000u   Units IV/HD  Venofer  0  Recent Activity: Hgb 9.9, Last  Last PTH 296 in Nov.  Assessment/Plan: 1. Small bowel obstruction - Mgmt per primary. Surgery consulted. Unclear etiology. Possible ileus secondary to narcotics for back pain and bed rest. Plans for bowel rest, decompression, close obs, and limited use of narcotics for now 2. HCAP - Mgmt per primary. Incidental finding on CT. On empiric Vanc/Zosyn per pharmacy 3. C. Diff Colitis - Diagnosed last admit. Was to complete po vanc regimen on 2/7. Some question as to whether he kept meds down due to recent N/V. Now on vanc enema 4. ESRD -  MWF, K+ 4.2. HD today 5. Hypertension/volume  - Soft SBPs, no meds. Weights here are way off. Standing wgts complicated by #9 below. Try for UF 2L given BP 6. Anemia  - Hgb 10.2. Aranesp 60 q Mon. Tsat 17% and Ferritin 771 on 2/2. Fe load completed op on 1/21. Hold off on further Fe until #1 resolved.  7. Metabolic bone disease -  Ca 8.2 (9.8  corrected.) Binders held last admit for low phos. Last PTH 296 in Nov on Hect 2. Follow labs. 8. Nutrition - NPO 9. L-1 Chance Fx/ Ankylosing Spondylosis - Conservative mgmt per neurosurgery. On nasal calcitonin 10. CAD/ICM - 11. Afib - On amiodarone. No anticoag d/t hx of GIB. Mgmt per primary 12. DM - Insulin per primary  Sonnie Alamo, PA-C [facility] Pager [phone] 09/26/2013, 11:29 AM   I have seen and examined patient, discussed with PA and agree with assessment and plan as outlined above. Kelly Splinter MD pager [phone]    cell [phone] 09/26/2013, 6:42 PM

## 2013-09-26 NOTE — Progress Notes (Signed)
Tele d/c'd, box 6E08 returned to [facility]. Dwaine, CMT notified.

## 2013-09-26 NOTE — Progress Notes (Signed)
Chaplain received consult request. Chaplain spoke to patient about AD. Patient said "he does not want to complete an AD at this time." Patient will confer with his brother and let his nurse know if he changes his mind about competing an AD.   09/26/13 1100  Clinical Encounter Type  Visited With Patient  Visit Type Initial

## 2013-09-26 NOTE — Progress Notes (Signed)
[facility] hospitalist progress note. Chief complaint. Transfer note. History of present illness. 73 year old male was admitted through [facility] long emergency room following 3 days of nausea vomiting. Workup indicated a small bowel obstruction and patient was consulted to [place] surgery. This felt to require transfer to [facility] for his history of end-stage renal disease on routine dialysis. The patient has now arrived at [facility] I'm seeing him at bedside to ensure he remained stable post transfer and his orders transferred appropriately. Vital signs. Temperature 98.9, pulse 87, respiration 16, blood pressure 106/70. O2 sats 97%. General appearance. Well-developed elderly male who is alert, cooperative, and in no distress. Cardiac. Rate and rhythm regular. Lungs. Breath sounds are coarse with some scattered rhonchi. Abdomen. Soft and somewhat protuberant with very hypotonic bowel sounds. Diffuse pain with palpation. NG tube is present. Impression/plan. Problem #1. Small bowel obstruction. NG tube has been placed to low suction for decompression. [place] surgery as origin than consulted. She has a lot of for pain. Problem #2. Health care associated pneumonia. Patient on broad spectrum antibiotics with vancomycin and Zosyn. Problem #3.? C. difficile colitis. Patient on vancomycin enema given acute colitis. Problem #4. End-stage renal disease. Will continue on Monday Wednesday Friday dialysis schedule. Nephrology is aware of his admission. Patient appears stable post transfer and all orders appear to of transferred appropriately.

## 2013-09-26 NOTE — Progress Notes (Signed)
Patient ID: Nicholas [name], male   DOB: September 08, 1940, 73 y.o.   MRN: [id number]    Subjective: Pt not passing flatus.  Still bloated  Objective: Vital signs in last 24 hours: Temp:  [97.7 F (36.5 C)-98.9 F (37.2 C)] 97.7 F (36.5 C) (02/09 1000) Pulse Rate:  [84-90] 84 (02/09 1000) Resp:  [14-20] 14 (02/09 1000) BP: (83-116)/(52-74) 85/52 mmHg (02/09 1000) SpO2:  [92 %-97 %] 96 % (02/09 1000) Weight:  [163 lb (73.936 kg)-227 lb 8.2 oz (103.2 kg)] 227 lb 8.2 oz (103.2 kg) (02/08 2357) Last BM Date: 09/25/13  Intake/Output from previous day: 02/08 0701 - 02/09 0700 In: -  Out: 2000 [Emesis/NG output:2000] Intake/Output this shift:    PE: Abd: soft, mild LMQ tenderness, +BS, NGT is getting clogged up with food particle.  77F removed and 34F replaced with much better results from his NGT.  Still distended  Lab Results:   Recent Labs  09/25/13 1621 09/26/13 0647  WBC 14.1* 16.3*  HGB 10.4* 10.2*  HCT 33.8* 33.1*  PLT 367 327   BMET  Recent Labs  09/25/13 1621 09/26/13 0647  NA 138 137  K 3.6* 4.2  CL 94* 94*  CO2 30 26  GLUCOSE 74 91  BUN 36* 43*  CREATININE 4.86* 5.34*  CALCIUM 8.2* 7.7*   PT/INR No results found for this basename: LABPROT, INR,  in the last 72 hours CMP     Component Value Date/Time   NA 137 09/26/2013 0647   K 4.2 09/26/2013 0647   CL 94* 09/26/2013 0647   CO2 26 09/26/2013 0647   GLUCOSE 91 09/26/2013 0647   BUN 43* 09/26/2013 0647   CREATININE 5.34* 09/26/2013 0647   CALCIUM 7.7* 09/26/2013 0647   PROT 5.7* 09/25/2013 1621   ALBUMIN 2.0* 09/25/2013 1621   AST 11 09/25/2013 1621   ALT 6 09/25/2013 1621   ALKPHOS 138* 09/25/2013 1621   BILITOT 0.5 09/25/2013 1621   GFRNONAA 10* 09/26/2013 0647   GFRAA 11* 09/26/2013 0647   Lipase     Component Value Date/Time   LIPASE 29 10/22/2012 0900       Studies/Results: Ct Abdomen Pelvis Wo Contrast  09/25/2013   CLINICAL DATA:  Vomiting, abdominal distention, constipation  EXAM: CT ABDOMEN AND PELVIS WITHOUT  CONTRAST  TECHNIQUE: Multidetector CT imaging of the abdomen and pelvis was performed following the standard protocol without intravenous contrast.  COMPARISON:  None.  FINDINGS: There are multiple dilated small bowel loops throughout the abdomen and pelvis. In the right mid abdomen, there is a small bowel loop with the extensive bowel content and crecent air anteriorly; this is more likely due to air surrounding stool and less likely due to pneumatosis. There is bowel wall thickening in decompressed ascending, transverse and descending colon.  The liver, spleen, pancreas are normal. The gallbladder is distended. The adrenal glands are normal. The kidneys are atrophic. There are nonobstructing left kidney stones. There are small high and low density lesions in both kidneys, evaluation is limited without contrast. There is atherosclerosis of the abdominal aorta without aneurysmal dilatation. There is no abdominal lymphadenopathy.  Evaluation is decompressed bladder is limited by metallic artifact from the bilateral hip replacements. There is a small left pleural effusion. There is compression atelectasis of the bilateral posterior lung bases. There are patchy airspace consolidation the of the posterior right upper lobe suspicious for pneumonia.  IMPRESSION: Findings consistent with small bowel obstruction. In the right mid abdomen, there is a small  bowel loop with the extensive bowel content and crecent air anteriorly; this is more likely due to air surrounding stool and less likely due to pneumatosis. Clinical correlation with lactose level is recommended.  Colonic bowel wall thickening of ascending, transverse, and descending colon. This is in part due to decompressed colon. However, colitis or ischemic colon are not excluded on this noncontrast exam.  Right upper lobe pneumonia.   Electronically Signed   By: Abelardo Diesel M.D.   On: 09/25/2013 17:56   Dg Abd Portable 1v  09/26/2013   CLINICAL DATA:  Small bowel  obstruction, abdominal pain and distention  EXAM: PORTABLE ABDOMEN - 1 VIEW  COMPARISON:  Prior abdominal radiograph 09/25/2013  FINDINGS: Persistent gaseous distension of the stomach and multiple loops of small bowel. The tip of the nasogastric tube projects over the gastric bubble, however the proximal side hole is in the region of the gastroesophageal junction. The degree of small-bowel distention is not significantly changed since the prior study. Maximal small bowel diameter is again 5 cm. No large free air on this single supine radiograph. Surgical changes of prior median sternotomy and bilateral total hip arthroplasties. The bones are osteopenic. Atherosclerotic vascular calcifications noted in the aorta.  IMPRESSION: 1. No significant interval change in the appearance of high-grade small bowel obstruction. Maximal small bowel diameter is again 5 cm. 2. The proximal side port of the nasogastric tube is in the GE junction. Consider advancing 5 cm for more optimal placement.   Electronically Signed   By: Jacqulynn Cadet M.D.   On: 09/26/2013 07:35   Dg Abd Portable 1v  09/25/2013   CLINICAL DATA:  Nasogastric tube placement  EXAM: PORTABLE ABDOMEN - 1 VIEW  COMPARISON:  CT abdomen September 25, 2013  FINDINGS: Nasogastric tube is identified with distal tip in the mid stomach. The stomach is air-filled and distended. There are multiple dilated small bowel loops.  IMPRESSION: Naso gastric tube distal tip in the mid stomach. Small bowel obstruction.   Electronically Signed   By: Abelardo Diesel M.D.   On: 09/25/2013 22:56    Anti-infectives: Anti-infectives   Start     Dose/Rate Route Frequency Ordered Stop   09/26/13 1200  vancomycin (VANCOCIN) IVPB 750 mg/150 ml premix     750 mg 150 mL/hr over 60 Minutes Intravenous Every M-W-F (Hemodialysis) 09/25/13 2344     09/26/13 0600  piperacillin-tazobactam (ZOSYN) IVPB 2.25 g     2.25 g 100 mL/hr over 30 Minutes Intravenous 3 times per day 09/25/13 2126      09/26/13 0000  vancomycin (VANCOCIN) 500 mg in sodium chloride irrigation 0.9 % 100 mL ENEMA     500 mg Rectal 4 times per day 09/25/13 2337     09/25/13 2130  vancomycin (VANCOCIN) IVPB 750 mg/150 ml premix     750 mg 150 mL/hr over 60 Minutes Intravenous  Once 09/25/13 2124 09/26/13 0145   09/25/13 2000  piperacillin-tazobactam (ZOSYN) IVPB 3.375 g     3.375 g 100 mL/hr over 30 Minutes Intravenous  Once 09/25/13 1853 09/25/13 2002   09/25/13 2000  vancomycin (VANCOCIN) IVPB 1000 mg/200 mL premix     1,000 mg 200 mL/hr over 60 Minutes Intravenous  Once 09/25/13 1859 09/25/13 2113       Assessment/Plan  1. Ileus vs SBO 2. Recent back fx 3. Multiple medical problems  Plan: 1. NGT replaced and working much better.  hopefully this will start to decompress him some as his films from  yesterday to today have little change.  We will repeat films in the morning.  Otherwise, cont bowel rest and NGT management    LOS: 1 day    Harvin Konicek E 09/26/2013, 10:37 AM Pager: [phone]

## 2013-09-26 NOTE — Progress Notes (Signed)
INITIAL NUTRITION ASSESSMENT  DOCUMENTATION CODES Per approved criteria  -Severe malnutrition in the context of chronic illness   INTERVENTION: RD to follow along for diet advancement and need for interventions. Please re-weigh patient to obtain current weight.  NUTRITION DIAGNOSIS: Inadequate oral intake related to poor appetite as evidenced by 9% weight loss in the past 3 months.   Goal: Diet advancement. Intake to meet >90% of estimated nutrition needs.  Monitor:  Diet advancement, PO intake, labs, weight trend.  Reason for Assessment: MST + RN Consult  73 y.o. male  Admitting Dx: back pain, diarrhea  ASSESSMENT: PMHx is significant for ESRD (MWF), CAD s/p CABG, DM, HTN, afib, COPD. Recently discharged from [facility] about 2 weeks ago 2/2 L1 compression fx was discharged to SNF. Pt was also dx with C diff during that time. Admitted now with constipation x 3-4 days, also with n/v every time he tries to eat. Work-up reveals ?c diff colitis and HCAP.  He states that for the past 4-5 days he has had persistent vomiting that has gradually worsened associated with abdominal distention. Unable to keep much of anything down. Vomiting is foul smelling. He has had a couple of small BMs 2-3 days ago but none since. Pt reports that the last time he had a normal meal was about 5 days ago.  CT scan which shows marked dilated small bowel and decompressed colon. MD suspects ileus secondary to narcotics and bed rest and multiple medical problems, although his colon is decompressed. Pt with NGT in place, pt with 2 liters output yesterday.  Patient reports poor intake at home due to poor appetite. He was only eating 2 meals per day. He drinks Nepro at home sometimes. Seen by RD during previous acute hospitalization 2 weeks ago and was dx with malnutrition; this is ongoing. Pt meets criteria for severe MALNUTRITION in the context of chronic illness as evidenced by 9% weight loss in 3 months with intake  <75% of estimated energy requirement for > 1 month.  Height: Ht Readings from Last 1 Encounters:  09/25/13 5\' 6"  (1.676 m)    Weight: Wt Readings from Last 1 Encounters:  09/25/13 227 lb 8.2 oz (103.2 kg)  This weight is incorrect; EDW per renal is around 69 kg  Ideal Body Weight: 64.5 kg/142 lb  % Ideal Body Weight: 107%  Wt Readings from Last 10 Encounters:  09/25/13 227 lb 8.2 oz (103.2 kg)  09/19/13 155 lb 10.3 oz (70.6 kg)  08/08/13 168 lb 3.4 oz (76.3 kg)  07/21/13 166 lb 3 oz (75.382 kg)  03/29/13 164 lb (74.39 kg)  03/15/13 164 lb 14.4 oz (74.798 kg)  03/03/13 169 lb (76.658 kg)  03/01/13 169 lb (76.658 kg)  02/22/13 163 lb (73.936 kg)  02/15/13 176 lb 2.4 oz (79.9 kg)    Usual Body Weight: 166 lb  % Usual Body Weight: 91%  BMI:  24.5 - using EDW of 69 kg  Estimated Nutritional Needs: Kcal: 2000-2400 Protein: 83-97 gm Fluid: 1.2 L  Skin: stage II pressure ulcer to sacrum  Diet Order: NPO   EDUCATION NEEDS: -Education not appropriate at this time   Intake/Output Summary (Last 24 hours) at 09/26/13 1010 Last data filed at 09/25/13 2059  Gross per 24 hour  Intake      0 ml  Output   2000 ml  Net  -2000 ml    Last BM: 2/8  Labs:   Recent Labs Lab 09/19/13 1822 09/25/13 1621 09/26/13 7062  NA 135* 138 137  K 3.5* 3.6* 4.2  CL 93* 94* 94*  CO2 30 30 26   BUN 11 36* 43*  CREATININE 2.29* 4.86* 5.34*  CALCIUM 7.6* 8.2* 7.7*  PHOS 2.3  --   --   GLUCOSE 113* 74 91    CBG (last 3)   Recent Labs  09/25/13 2345 09/26/13 0408 09/26/13 0742  GLUCAP 93 105* 95    Scheduled Meds: . aspirin  300 mg Rectal Daily  . calcitonin (salmon)  1 spray Alternating Nares Daily  . darbepoetin  60 mcg Intravenous Q Mon-HD  . heparin  5,000 Units Subcutaneous Q8H  . insulin aspart  0-9 Units Subcutaneous Q4H  . pantoprazole (PROTONIX) IV  40 mg Intravenous Daily  . piperacillin-tazobactam (ZOSYN)  IV  2.25 g Intravenous Q8H  . vancomycin  (VANCOCIN) rectal ENEMA  500 mg Rectal Q6H  . vancomycin  750 mg Intravenous Q M,W,F-HD    Continuous Infusions:    Past Medical History  Diagnosis Date  . Coronary artery disease     a. hx of MI 1992; b. 10/2012 NSTEMI (no cath 2/2 GIB);  c. 11/2012 MV: Inf defect w inf and septal HK (favor prior MI), EF 47%;  d. 12/2012 Cath: Severe 3vd, EF 45-50%;  e. 01/2013 CABG x 5 (LIMA->LAD, VG->D1, VG->OM1->OM2, VG->PDA)  . Diabetes mellitus   . Hypertension   . Arthritis   . Carotid artery disease     a. 01/2013 doppler: >80% RICA stenosis, <15% LICA stenosis;  b. 11/84 R CEA.  . Gastritis 2012 and 09/2011    treated for H Pylori in 12/2011  . Diabetic neuropathy   . Osteoporosis   . COPD (chronic obstructive pulmonary disease)   . Anxiety   . Depression   . ESRD (end stage renal disease)     M-W-Sa  [name]  . Diabetic nephropathy   . GERD (gastroesophageal reflux disease)   . Neuromuscular disorder     peripheral neurotherapy  . Anemia   . A-fib     a. Dx 10/2012, on amio, no anticoagulation 2/2 GIB 10/2012.  [name] GIB (gastrointestinal bleeding)     a. 10/2012, awaiting colonoscopy.    Past Surgical History  Procedure Laterality Date  . Coronary stent placement    . Hip surgery      both hips  . Av fistula placement      Left arm  . Cardiac catheterization    . Joint replacement Bilateral   . Eye surgery Bilateral     cararacts  . Coronary artery bypass graft N/A 02/03/2013    Procedure: CORONARY ARTERY BYPASS GRAFTING (CABG);  Surgeon: Melrose Nakayama, MD;  Location: [place];  Service: Open Heart Surgery;  Laterality: N/A;  . Endarterectomy N/A 02/03/2013    Procedure: ENDARTERECTOMY CAROTID;  Surgeon: Mal Misty, MD;  Location: [place];  Service: Vascular;  Laterality: N/A;  . Olecranon bursectomy Right 07/28/2013    Procedure: RIGHT ELBOW OLECRANON BURSECTOMY ADVANCEMENT CLOSURE ;  Surgeon: Linna Hoff, MD;  Location: [place];  Service: Orthopedics;  Laterality:  Right;    Inda Coke MS, RD, LDN Pager: [phone] After-hours pager: [phone]

## 2013-09-27 ENCOUNTER — Inpatient Hospital Stay (HOSPITAL_COMMUNITY): Payer: Medicare Other

## 2013-09-27 DIAGNOSIS — K56609 Unspecified intestinal obstruction, unspecified as to partial versus complete obstruction: Secondary | ICD-10-CM

## 2013-09-27 LAB — CBC
HCT: 32.7 % — ABNORMAL LOW (ref 39.0–52.0)
Hemoglobin: 10.4 g/dL — ABNORMAL LOW (ref 13.0–17.0)
MCH: 30.1 pg (ref 26.0–34.0)
MCHC: 31.8 g/dL (ref 30.0–36.0)
MCV: 94.5 fL (ref 78.0–100.0)
PLATELETS: 317 10*3/uL (ref 150–400)
RBC: 3.46 MIL/uL — AB (ref 4.22–5.81)
RDW: 18.5 % — ABNORMAL HIGH (ref 11.5–15.5)
WBC: 16.6 10*3/uL — ABNORMAL HIGH (ref 4.0–10.5)

## 2013-09-27 LAB — RENAL FUNCTION PANEL
Albumin: 1.7 g/dL — ABNORMAL LOW (ref 3.5–5.2)
BUN: 58 mg/dL — ABNORMAL HIGH (ref 6–23)
CHLORIDE: 97 meq/L (ref 96–112)
CO2: 23 mEq/L (ref 19–32)
Calcium: 7.3 mg/dL — ABNORMAL LOW (ref 8.4–10.5)
Creatinine, Ser: 6.29 mg/dL — ABNORMAL HIGH (ref 0.50–1.35)
GFR calc Af Amer: 9 mL/min — ABNORMAL LOW (ref >90)
GFR, EST NON AFRICAN AMERICAN: 8 mL/min — AB (ref >90)
Glucose, Bld: 84 mg/dL (ref 70–99)
PHOSPHORUS: 5.4 mg/dL — AB (ref 2.3–4.6)
Potassium: 4.4 mEq/L (ref 3.7–5.3)
Sodium: 140 mEq/L (ref 137–147)

## 2013-09-27 LAB — GLUCOSE, CAPILLARY
Glucose-Capillary: 130 mg/dL — ABNORMAL HIGH (ref 70–99)
Glucose-Capillary: 67 mg/dL — ABNORMAL LOW (ref 70–99)
Glucose-Capillary: 79 mg/dL (ref 70–99)
Glucose-Capillary: 82 mg/dL (ref 70–99)
Glucose-Capillary: 92 mg/dL (ref 70–99)
Glucose-Capillary: 97 mg/dL (ref 70–99)

## 2013-09-27 MED ORDER — DARBEPOETIN ALFA-POLYSORBATE 60 MCG/0.3ML IJ SOLN
INTRAMUSCULAR | Status: AC
  Filled 2013-09-27: qty 0.3

## 2013-09-27 MED ORDER — MIDODRINE HCL 5 MG PO TABS
5.0000 mg | ORAL_TABLET | ORAL | Status: DC
  Filled 2013-09-27 (×3): qty 1

## 2013-09-27 MED ORDER — DEXTROSE 50 % IV SOLN
INTRAVENOUS | Status: AC
  Administered 2013-09-27: 25 mL
  Filled 2013-09-27: qty 50

## 2013-09-27 MED ORDER — DOXERCALCIFEROL 4 MCG/2ML IV SOLN
INTRAVENOUS | Status: AC
  Filled 2013-09-27: qty 2

## 2013-09-27 NOTE — Progress Notes (Signed)
PT Cancellation Note  Patient Details Name: Nicholas [name] MRN: [id number] DOB: 21-Mar-1941   Cancelled Treatment:    Reason Eval/Treat Not Completed: Pain limiting ability to participate;Fatigue/lethargy limiting ability to participate;Medical issues which prohibited therapy; patient reports had dialysis this morning and abdominal xray and back hurts too much along with NG tube to allow him to mobilize today.  Encouraged participation stating may help pain to get out of bed or sit edge of bed due to prolonged bedrest.  Patient reports plan for surgery tomorrow and just wants to get that over with then try getting up.  Will attempt to see tomorrow or Thursday after surgery.   Landree Fernholz,CYNDI 09/27/2013, 3:06 PM

## 2013-09-27 NOTE — Procedures (Signed)
I was present at this dialysis session, have reviewed the session itself and made  appropriate changes  Kelly Splinter MD (pgr) [phone]    [phone] 09/27/2013, 10:49 AM

## 2013-09-27 NOTE — Progress Notes (Signed)
Subjective: No flatus.  959ml recorded yesterday.  No flatus.  No b/v.    Objective: Vital signs in last 24 hours: Temp:  [97.7 F (36.5 C)-98.3 F (36.8 C)] 98.1 F (36.7 C) (02/10 0658) Pulse Rate:  [72-84] 79 (02/10 0800) Resp:  [14-16] 14 (02/10 0730) BP: (80-103)/(49-56) 80/52 mmHg (02/10 0800) SpO2:  [96 %-100 %] 100 % (02/10 0658) Weight:  [228 lb 3.2 oz (103.511 kg)-229 lb 4.5 oz (104 kg)] 229 lb 4.5 oz (104 kg) (02/10 0658) Last BM Date: 09/25/13  Intake/Output from previous day: 02/09 0701 - 02/10 0700 In: 160 [NG/GT:60; IV Piggyback:100] Out: 950 [Emesis/NG output:950] Intake/Output this shift:   PE  GI: +bs abdomen is a bit less distended today.  still exhibits TTP to Left mid abdomen.  no guarding.   NGT flushed to ensure it is working properly, functional.   Lab Results:   Recent Labs  09/26/13 0647 09/27/13 0500  WBC 16.3* 16.6*  HGB 10.2* 10.4*  HCT 33.1* 32.7*  PLT 327 317   BMET  Recent Labs  09/26/13 0647 09/27/13 0500  NA 137 140  K 4.2 4.4  CL 94* 97  CO2 26 23  GLUCOSE 91 84  BUN 43* 58*  CREATININE 5.34* 6.29*  CALCIUM 7.7* 7.3*   PT/INR No results found for this basename: LABPROT, INR,  in the last 72 hours ABG No results found for this basename: PHART, PCO2, PO2, HCO3,  in the last 72 hours  Studies/Results: Ct Abdomen Pelvis Wo Contrast  09/25/2013   CLINICAL DATA:  Vomiting, abdominal distention, constipation  EXAM: CT ABDOMEN AND PELVIS WITHOUT CONTRAST  TECHNIQUE: Multidetector CT imaging of the abdomen and pelvis was performed following the standard protocol without intravenous contrast.  COMPARISON:  None.  FINDINGS: There are multiple dilated small bowel loops throughout the abdomen and pelvis. In the right mid abdomen, there is a small bowel loop with the extensive bowel content and crecent air anteriorly; this is more likely due to air surrounding stool and less likely due to pneumatosis. There is bowel wall thickening in  decompressed ascending, transverse and descending colon.  The liver, spleen, pancreas are normal. The gallbladder is distended. The adrenal glands are normal. The kidneys are atrophic. There are nonobstructing left kidney stones. There are small high and low density lesions in both kidneys, evaluation is limited without contrast. There is atherosclerosis of the abdominal aorta without aneurysmal dilatation. There is no abdominal lymphadenopathy.  Evaluation is decompressed bladder is limited by metallic artifact from the bilateral hip replacements. There is a small left pleural effusion. There is compression atelectasis of the bilateral posterior lung bases. There are patchy airspace consolidation the of the posterior right upper lobe suspicious for pneumonia.  IMPRESSION: Findings consistent with small bowel obstruction. In the right mid abdomen, there is a small bowel loop with the extensive bowel content and crecent air anteriorly; this is more likely due to air surrounding stool and less likely due to pneumatosis. Clinical correlation with lactose level is recommended.  Colonic bowel wall thickening of ascending, transverse, and descending colon. This is in part due to decompressed colon. However, colitis or ischemic colon are not excluded on this noncontrast exam.  Right upper lobe pneumonia.   Electronically Signed   By: Abelardo Diesel M.D.   On: 09/25/2013 17:56   Dg Abd Portable 1v  09/26/2013   CLINICAL DATA:  Small bowel obstruction, abdominal pain and distention  EXAM: PORTABLE ABDOMEN - 1 VIEW  COMPARISON:  Prior abdominal radiograph 09/25/2013  FINDINGS: Persistent gaseous distension of the stomach and multiple loops of small bowel. The tip of the nasogastric tube projects over the gastric bubble, however the proximal side hole is in the region of the gastroesophageal junction. The degree of small-bowel distention is not significantly changed since the prior study. Maximal small bowel diameter is again  5 cm. No large free air on this single supine radiograph. Surgical changes of prior median sternotomy and bilateral total hip arthroplasties. The bones are osteopenic. Atherosclerotic vascular calcifications noted in the aorta.  IMPRESSION: 1. No significant interval change in the appearance of high-grade small bowel obstruction. Maximal small bowel diameter is again 5 cm. 2. The proximal side port of the nasogastric tube is in the GE junction. Consider advancing 5 cm for more optimal placement.   Electronically Signed   By: Jacqulynn Cadet M.D.   On: 09/26/2013 07:35   Dg Abd Portable 1v  09/25/2013   CLINICAL DATA:  Nasogastric tube placement  EXAM: PORTABLE ABDOMEN - 1 VIEW  COMPARISON:  CT abdomen September 25, 2013  FINDINGS: Nasogastric tube is identified with distal tip in the mid stomach. The stomach is air-filled and distended. There are multiple dilated small bowel loops.  IMPRESSION: Naso gastric tube distal tip in the mid stomach. Small bowel obstruction.   Electronically Signed   By: Abelardo Diesel M.D.   On: 09/25/2013 22:56    Anti-infectives: Anti-infectives   Start     Dose/Rate Route Frequency Ordered Stop   09/27/13 1200  vancomycin (VANCOCIN) IVPB 1000 mg/200 mL premix  Status:  Discontinued     1,000 mg 200 mL/hr over 60 Minutes Intravenous Every M-W-F (Hemodialysis) 09/26/13 1638 09/26/13 1721   09/26/13 1745  metroNIDAZOLE (FLAGYL) IVPB 500 mg     500 mg 100 mL/hr over 60 Minutes Intravenous Every 8 hours 09/26/13 1722     09/26/13 1200  vancomycin (VANCOCIN) IVPB 750 mg/150 ml premix  Status:  Discontinued     750 mg 150 mL/hr over 60 Minutes Intravenous Every M-W-F (Hemodialysis) 09/25/13 2344 09/26/13 1051   09/26/13 1200  vancomycin (VANCOCIN) IVPB 1000 mg/200 mL premix  Status:  Discontinued     1,000 mg 200 mL/hr over 60 Minutes Intravenous Every M-W-F (Hemodialysis) 09/26/13 1052 09/26/13 1638   09/26/13 0600  piperacillin-tazobactam (ZOSYN) IVPB 2.25 g  Status:   Discontinued     2.25 g 100 mL/hr over 30 Minutes Intravenous 3 times per day 09/25/13 2126 09/26/13 1721   09/26/13 0000  vancomycin (VANCOCIN) 500 mg in sodium chloride irrigation 0.9 % 100 mL ENEMA  Status:  Discontinued     500 mg Rectal 4 times per day 09/25/13 2337 09/26/13 1722   09/25/13 2130  vancomycin (VANCOCIN) IVPB 750 mg/150 ml premix     750 mg 150 mL/hr over 60 Minutes Intravenous  Once 09/25/13 2124 09/26/13 0145   09/25/13 2000  piperacillin-tazobactam (ZOSYN) IVPB 3.375 g     3.375 g 100 mL/hr over 30 Minutes Intravenous  Once 09/25/13 1853 09/25/13 2002   09/25/13 2000  vancomycin (VANCOCIN) IVPB 1000 mg/200 mL premix     1,000 mg 200 mL/hr over 60 Minutes Intravenous  Once 09/25/13 1859 09/25/13 2113      Assessment/Plan: 1. Ileus vs SBO  2. Recent back fx  3. Multiple medical problems  Continue NGT to LWIS, bowel rest.  He may have ice chips.  Await XR from today.     LOS: 2 days  Erby Pian  ANP-BC  09/27/2013 8:38 AM

## 2013-09-27 NOTE — Progress Notes (Signed)
Utilization review completed. Adiba Fargnoli, RN, BSN. 

## 2013-09-27 NOTE — Progress Notes (Signed)
  [facility] Progress Note   Subjective: no flatus or BM, bp dropped at HD with UF attempt, UF off now  Exam  Blood pressure 94/57, pulse 83, temperature 98.1 F (36.7 C), temperature source Oral, resp. rate 14, height 5\' 6"  (1.676 m), weight 104 kg (229 lb 4.5 oz), SpO2 100.00%. Alert, pale elderly male, on dialysis, no distress NG tube in place No jvd Clear lungs bilat RRR no MRG Abd soft, mildly distended, dec'd BS No LE edema L AVF patent  Dialysis: [facility] 4h   69kg  2K//2.25[place]ath   Heparin 5000   LUE AVF   Hectorol 2     Epo 5000     Venofer none Last labs: Hb 9.9 last pth 296   Assessment: 1 Nausea / vomiting / SBO vs ileus- NG in place, per primary 2 ESRD HD today then mwf 3 Cdif - on iv flagyl 4 L1 vertebral fx- recent, nonsurgical Rx 5 CAD / afib- no anticoag d/t hx GIB 6 Anemia Hb 10, on darbe 100/wk 7 2HPTH- vit D, 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 8 HTN/volume- low BP's chronic, keep even, wt's inaccurate, midodrine pre HD   Plan- HD today and tomorrow, keep even    Kelly Splinter MD  pager [phone]    cell [phone]  09/27/2013, 10:50 AM     Recent Labs Lab 09/25/13 1621 09/26/13 0647 09/27/13 0500  NA 138 137 140  K 3.6* 4.2 4.4  CL 94* 94* 97  CO2 30 26 23   GLUCOSE 74 91 84  BUN 36* 43* 58*  CREATININE 4.86* 5.34* 6.29*  CALCIUM 8.2* 7.7* 7.3*  PHOS  --   --  5.4*    Recent Labs Lab 09/25/13 1621 09/27/13 0500  AST 11  --   ALT 6  --   ALKPHOS 138*  --   BILITOT 0.5  --   PROT 5.7*  --   ALBUMIN 2.0* 1.7*    Recent Labs Lab 09/25/13 1621 09/26/13 0647 09/27/13 0500  WBC 14.1* 16.3* 16.6*  NEUTROABS 12.1*  --   --   HGB 10.4* 10.2* 10.4*  HCT 33.8* 33.1* 32.7*  MCV 96.0 95.7 94.5  PLT 367 327 317   . calcitonin (salmon)  1 spray Alternating Nares Daily  . darbepoetin  60 mcg Intravenous Q Mon-HD  . doxercalciferol  2 mcg Intravenous Q M,W,F-HD  . heparin  5,000 Units Subcutaneous Q8H  . insulin aspart  0-9 Units  Subcutaneous Q4H  . metronidazole  500 mg Intravenous Q8H   . sodium chloride 10 mL/hr (09/26/13 1814)   acetaminophen, acetaminophen, HYDROmorphone (DILAUDID) injection, promethazine

## 2013-09-27 NOTE — Progress Notes (Signed)
[facility] PROGRESS NOTE  Nicholas [name] EHU:[id number] DOB: 1941-07-10 DOA: 09/25/2013 PCP: [facility]  Assessment/Plan:  Principal Problem:   Small bowel obstruction: abd less distended. Xray better Active Problems:   ESRD (end stage renal disease) on dialysis   DM (diabetes mellitus)   CAD (coronary artery disease)   Chronic Systolic CHF, compensated   L1 vertebral fracture Abnormal CT showing infiltrate: no cough. Clinically, no pneumonia.    Hypokalemia: replete Recent C diff. IV flagyl. protonix stopped  Discussed with Dr. Grandville Silos  Code Status:  full Family Communication:   Disposition Plan:  SNF  HPI/Subjective: No stool. No flatus. No cough. No abd pain.  Objective: Filed Vitals:   09/27/13 1048  BP: 91/54  Pulse: 85  Temp: 97.8 F (36.6 C)  Resp: 16    Intake/Output Summary (Last 24 hours) at 09/27/13 1348 Last data filed at 09/27/13 1145  Gross per 24 hour  Intake    260 ml  Output   1618 ml  Net  -1358 ml   Filed Weights   09/26/13 1048 09/26/13 2046 09/27/13 0658  Weight: 103.511 kg (228 lb 3.2 oz) 103.692 kg (228 lb 9.6 oz) 104 kg (229 lb 4.5 oz)    Exam:   General:  Asleep, arousable. Comfortable  HEENT: NG in place.   Cardiovascular: RRR without MGR  Respiratory: CTA without WRR  Abdomen: bowel sounds present. Soft, nontender, less distended  Ext: no cce  Basic Metabolic Panel:  Recent Labs Lab 09/25/13 1621 09/26/13 0647 09/27/13 0500  NA 138 137 140  K 3.6* 4.2 4.4  CL 94* 94* 97  CO2 30 26 23   GLUCOSE 74 91 84  BUN 36* 43* 58*  CREATININE 4.86* 5.34* 6.29*  CALCIUM 8.2* 7.7* 7.3*  PHOS  --   --  5.4*   Liver Function Tests:  Recent Labs Lab 09/25/13 1621 09/27/13 0500  AST 11  --   ALT 6  --   ALKPHOS 138*  --   BILITOT 0.5  --   PROT 5.7*  --   ALBUMIN 2.0* 1.7*   No results found for this basename: LIPASE, AMYLASE,  in the last 168 hours No results found for this basename: AMMONIA,  in the  last 168 hours CBC:  Recent Labs Lab 09/25/13 1621 09/26/13 0647 09/27/13 0500  WBC 14.1* 16.3* 16.6*  NEUTROABS 12.1*  --   --   HGB 10.4* 10.2* 10.4*  HCT 33.8* 33.1* 32.7*  MCV 96.0 95.7 94.5  PLT 367 327 317   Cardiac Enzymes: No results found for this basename: CKTOTAL, CKMB, CKMBINDEX, TROPONINI,  in the last 168 hours BNP (last 3 results)  Recent Labs  10/20/12 0320  PROBNP 38564.0*   CBG:  Recent Labs Lab 09/26/13 1605 09/26/13 2045 09/27/13 0017 09/27/13 0359 09/27/13 1152  GLUCAP 93 87 97 82 79    Recent Results (from the past 240 hour(s))  CLOSTRIDIUM DIFFICILE BY PCR     Status: None   Collection Time    09/18/13  2:03 PM      Result Value Range Status   C difficile by pcr NEGATIVE  NEGATIVE Final  MRSA PCR SCREENING     Status: None   Collection Time    09/25/13 11:46 PM      Result Value Range Status   MRSA by PCR NEGATIVE  NEGATIVE Final   Comment:            The GeneXpert MRSA Assay (FDA  approved for NASAL specimens     only), is one component of a     comprehensive MRSA colonization     surveillance program. It is not     intended to diagnose MRSA     infection nor to guide or     monitor treatment for     MRSA infections.     Studies: Ct Abdomen Pelvis Wo Contrast  09/25/2013   CLINICAL DATA:  Vomiting, abdominal distention, constipation  EXAM: CT ABDOMEN AND PELVIS WITHOUT CONTRAST  TECHNIQUE: Multidetector CT imaging of the abdomen and pelvis was performed following the standard protocol without intravenous contrast.  COMPARISON:  None.  FINDINGS: There are multiple dilated small bowel loops throughout the abdomen and pelvis. In the right mid abdomen, there is a small bowel loop with the extensive bowel content and crecent air anteriorly; this is more likely due to air surrounding stool and less likely due to pneumatosis. There is bowel wall thickening in decompressed ascending, transverse and descending colon.  The liver, spleen,  pancreas are normal. The gallbladder is distended. The adrenal glands are normal. The kidneys are atrophic. There are nonobstructing left kidney stones. There are small high and low density lesions in both kidneys, evaluation is limited without contrast. There is atherosclerosis of the abdominal aorta without aneurysmal dilatation. There is no abdominal lymphadenopathy.  Evaluation is decompressed bladder is limited by metallic artifact from the bilateral hip replacements. There is a small left pleural effusion. There is compression atelectasis of the bilateral posterior lung bases. There are patchy airspace consolidation the of the posterior right upper lobe suspicious for pneumonia.  IMPRESSION: Findings consistent with small bowel obstruction. In the right mid abdomen, there is a small bowel loop with the extensive bowel content and crecent air anteriorly; this is more likely due to air surrounding stool and less likely due to pneumatosis. Clinical correlation with lactose level is recommended.  Colonic bowel wall thickening of ascending, transverse, and descending colon. This is in part due to decompressed colon. However, colitis or ischemic colon are not excluded on this noncontrast exam.  Right upper lobe pneumonia.   Electronically Signed   By: Abelardo Diesel M.D.   On: 09/25/2013 17:56   Dg Abd 2 Views  09/27/2013   CLINICAL DATA:  Mid abdominal pain, back pain  EXAM: ABDOMEN - 2 VIEW  COMPARISON:  DG ABD PORTABLE 1V dated 09/26/2013  FINDINGS: Multiple dilated loops small bowel appreciated throughout the abdomen. Air-fluid levels identified. There is no evidence of free air. A moderate amount of stools appreciated region of the ascending colon. There is a paucity of distal bowel gas. The NG tube is seen with tip curled in the region of the stomach. When compared to previous study the dilated loops of small bowel have decreased in size and distribution.  IMPRESSION: Findings consistent with an improving  small bowel obstruction. There is no evidence of free air.   Electronically Signed   By: Margaree Mackintosh M.D.   On: 09/27/2013 13:27   Dg Abd Portable 1v  09/26/2013   CLINICAL DATA:  Small bowel obstruction, abdominal pain and distention  EXAM: PORTABLE ABDOMEN - 1 VIEW  COMPARISON:  Prior abdominal radiograph 09/25/2013  FINDINGS: Persistent gaseous distension of the stomach and multiple loops of small bowel. The tip of the nasogastric tube projects over the gastric bubble, however the proximal side hole is in the region of the gastroesophageal junction. The degree of small-bowel distention is not significantly changed since  the prior study. Maximal small bowel diameter is again 5 cm. No large free air on this single supine radiograph. Surgical changes of prior median sternotomy and bilateral total hip arthroplasties. The bones are osteopenic. Atherosclerotic vascular calcifications noted in the aorta.  IMPRESSION: 1. No significant interval change in the appearance of high-grade small bowel obstruction. Maximal small bowel diameter is again 5 cm. 2. The proximal side port of the nasogastric tube is in the GE junction. Consider advancing 5 cm for more optimal placement.   Electronically Signed   By: Jacqulynn Cadet M.D.   On: 09/26/2013 07:35   Dg Abd Portable 1v  09/25/2013   CLINICAL DATA:  Nasogastric tube placement  EXAM: PORTABLE ABDOMEN - 1 VIEW  COMPARISON:  CT abdomen September 25, 2013  FINDINGS: Nasogastric tube is identified with distal tip in the mid stomach. The stomach is air-filled and distended. There are multiple dilated small bowel loops.  IMPRESSION: Naso gastric tube distal tip in the mid stomach. Small bowel obstruction.   Electronically Signed   By: Abelardo Diesel M.D.   On: 09/25/2013 22:56    Scheduled Meds: . calcitonin (salmon)  1 spray Alternating Nares Daily  . darbepoetin  60 mcg Intravenous Q Mon-HD  . doxercalciferol  2 mcg Intravenous Q M,W,F-HD  . heparin  5,000 Units  Subcutaneous Q8H  . insulin aspart  0-9 Units Subcutaneous Q4H  . metronidazole  500 mg Intravenous Q8H  . [START ON 09/28/2013] midodrine  5 mg Oral Q M,W,F-HD   Continuous Infusions: . sodium chloride 10 mL/hr (09/26/13 1814)    Time spent: 25 minutes  [facility] Pager [phone]. If 7PM-7AM, please contact night-coverage at [email or web address], password [facility] 09/27/2013, 1:48 PM  LOS: 2 days

## 2013-09-27 NOTE — Progress Notes (Signed)
Less distended and NT on my exam. X-rays with some improvement. Will follow closely. I also D/W Dr. Conley Canal.  Patient examined and I agree with the assessment and plan  Georganna Skeans, MD, MPH, FACS Pager: [phone]  09/27/2013 1:37 PM

## 2013-09-28 ENCOUNTER — Inpatient Hospital Stay (HOSPITAL_COMMUNITY): Payer: Medicare Other

## 2013-09-28 DIAGNOSIS — T82898A Other specified complication of vascular prosthetic devices, implants and grafts, initial encounter: Secondary | ICD-10-CM

## 2013-09-28 DIAGNOSIS — N186 End stage renal disease: Secondary | ICD-10-CM

## 2013-09-28 LAB — GLUCOSE, CAPILLARY
GLUCOSE-CAPILLARY: 93 mg/dL (ref 70–99)
Glucose-Capillary: 101 mg/dL — ABNORMAL HIGH (ref 70–99)
Glucose-Capillary: 80 mg/dL (ref 70–99)
Glucose-Capillary: 92 mg/dL (ref 70–99)

## 2013-09-28 LAB — RENAL FUNCTION PANEL
Albumin: 1.7 g/dL — ABNORMAL LOW (ref 3.5–5.2)
BUN: 44 mg/dL — ABNORMAL HIGH (ref 6–23)
CHLORIDE: 98 meq/L (ref 96–112)
CO2: 20 meq/L (ref 19–32)
Calcium: 7.3 mg/dL — ABNORMAL LOW (ref 8.4–10.5)
Creatinine, Ser: 5.07 mg/dL — ABNORMAL HIGH (ref 0.50–1.35)
GFR, EST AFRICAN AMERICAN: 12 mL/min — AB (ref >90)
GFR, EST NON AFRICAN AMERICAN: 10 mL/min — AB (ref >90)
Glucose, Bld: 92 mg/dL (ref 70–99)
POTASSIUM: 4 meq/L (ref 3.7–5.3)
Phosphorus: 4.6 mg/dL (ref 2.3–4.6)
Sodium: 139 mEq/L (ref 137–147)

## 2013-09-28 MED ORDER — FENTANYL CITRATE 0.05 MG/ML IJ SOLN
INTRAMUSCULAR | Status: AC | PRN
  Administered 2013-09-28 (×3): 25 ug via INTRAVENOUS

## 2013-09-28 MED ORDER — IOHEXOL 300 MG/ML  SOLN
100.0000 mL | Freq: Once | INTRAMUSCULAR | Status: AC | PRN
  Administered 2013-09-28: 40 mL via INTRAVENOUS

## 2013-09-28 MED ORDER — CEFUROXIME SODIUM 1.5 G IJ SOLR
1.5000 g | INTRAMUSCULAR | Status: DC
  Filled 2013-09-28: qty 1.5

## 2013-09-28 MED ORDER — HYDROMORPHONE HCL PF 1 MG/ML IJ SOLN
INTRAMUSCULAR | Status: AC
  Administered 2013-09-28: 0.5 mg via INTRAVENOUS
  Filled 2013-09-28: qty 1

## 2013-09-28 MED ORDER — MIDAZOLAM HCL 2 MG/2ML IJ SOLN
INTRAMUSCULAR | Status: AC | PRN
  Administered 2013-09-28: 0.5 mg via INTRAVENOUS
  Administered 2013-09-28: 1 mg via INTRAVENOUS
  Administered 2013-09-28: 0.5 mg via INTRAVENOUS

## 2013-09-28 MED ORDER — FENTANYL CITRATE 0.05 MG/ML IJ SOLN
INTRAMUSCULAR | Status: AC
  Filled 2013-09-28: qty 2

## 2013-09-28 MED ORDER — FLEET ENEMA 7-19 GM/118ML RE ENEM
1.0000 | ENEMA | Freq: Once | RECTAL | Status: AC
  Administered 2013-09-28: 1 via RECTAL
  Filled 2013-09-28: qty 1

## 2013-09-28 MED ORDER — CEFAZOLIN SODIUM-DEXTROSE 2-3 GM-% IV SOLR
2.0000 g | Freq: Once | INTRAVENOUS | Status: AC
  Administered 2013-09-28: 2 g via INTRAVENOUS
  Filled 2013-09-28: qty 50

## 2013-09-28 MED ORDER — ALTEPLASE 100 MG IV SOLR
2.0000 mg | Freq: Once | INTRAVENOUS | Status: AC
  Administered 2013-09-28: 2 mg
  Filled 2013-09-28 (×2): qty 2

## 2013-09-28 MED ORDER — HYDROMORPHONE HCL PF 1 MG/ML IJ SOLN
INTRAMUSCULAR | Status: AC | PRN
  Administered 2013-09-28: 0.5 mg

## 2013-09-28 MED ORDER — MIDAZOLAM HCL 2 MG/2ML IJ SOLN
INTRAMUSCULAR | Status: AC
  Filled 2013-09-28: qty 2

## 2013-09-28 MED ORDER — DEXTROSE-NACL 5-0.2 % IV SOLN
INTRAVENOUS | Status: DC
  Administered 2013-09-28: 65 mL/h via INTRAVENOUS

## 2013-09-28 MED ORDER — HEPARIN SODIUM (PORCINE) 1000 UNIT/ML IJ SOLN
INTRAMUSCULAR | Status: AC
  Filled 2013-09-28: qty 1

## 2013-09-28 NOTE — Progress Notes (Signed)
Unable to dialyze pt this morning related to arterial needle sucking at the wall continuous and arterial needle clotting x3.  Sonnie Alamo notified and at the bedside at the time of event.  Pt will be sent to interventions for further evaluations.

## 2013-09-28 NOTE — Progress Notes (Signed)
Subjective: No flatus.  No bm.  Less pain today.  In HD going back to room now  Objective: Vital signs in last 24 hours: Temp:  [97.8 F (36.6 C)-98.8 F (37.1 C)] 98.8 F (37.1 C) (02/11 0407) Pulse Rate:  [79-88] 80 (02/11 0407) Resp:  [14-18] 18 (02/11 0407) BP: (67-107)/(40-70) 104/61 mmHg (02/11 0407) SpO2:  [91 %-98 %] 96 % (02/11 0407) Weight:  [228 lb 4.8 oz (103.556 kg)] 228 lb 4.8 oz (103.556 kg) October 05, 2022 2009) Last BM Date: 09/25/13  Intake/Output from previous day: 10/05/22 0701 - 02/11 0700 In: 190 [NG/GT:90; IV Piggyback:100] Out: 818 [Emesis/NG output:350] Intake/Output this shift:   PE General appearance: alert, cooperative and no distress GI: +bs, less distended.  periumbilical tenderness to palpation.  no evidence of peritonitis or incarcerated hernias.    Lab Results:   Recent Labs  09/26/13 0647 10-05-2013 0500  WBC 16.3* 16.6*  HGB 10.2* 10.4*  HCT 33.1* 32.7*  PLT 327 317   BMET  Recent Labs  10-05-13 0500 09/28/13 0738  NA 140 139  K 4.4 4.0  CL 97 98  CO2 23 20  GLUCOSE 84 92  BUN 58* 44*  CREATININE 6.29* 5.07*  CALCIUM 7.3* 7.3*   PT/INR No results found for this basename: LABPROT, INR,  in the last 72 hours ABG No results found for this basename: PHART, PCO2, PO2, HCO3,  in the last 72 hours  Studies/Results: Dg Abd 2 Views  10-05-2013   CLINICAL DATA:  Mid abdominal pain, back pain  EXAM: ABDOMEN - 2 VIEW  COMPARISON:  DG ABD PORTABLE 1V dated 09/26/2013  FINDINGS: Multiple dilated loops small bowel appreciated throughout the abdomen. Air-fluid levels identified. There is no evidence of free air. A moderate amount of stools appreciated region of the ascending colon. There is a paucity of distal bowel gas. The NG tube is seen with tip curled in the region of the stomach. When compared to previous study the dilated loops of small bowel have decreased in size and distribution.  IMPRESSION: Findings consistent with an improving small bowel  obstruction. There is no evidence of free air.   Electronically Signed   By: Margaree Mackintosh M.D.   On: 10-05-13 13:27    Anti-infectives: Anti-infectives   Start     Dose/Rate Route Frequency Ordered Stop   2013-10-05 1200  vancomycin (VANCOCIN) IVPB 1000 mg/200 mL premix  Status:  Discontinued     1,000 mg 200 mL/hr over 60 Minutes Intravenous Every M-W-F (Hemodialysis) 09/26/13 1638 09/26/13 1721   09/26/13 1745  metroNIDAZOLE (FLAGYL) IVPB 500 mg     500 mg 100 mL/hr over 60 Minutes Intravenous Every 8 hours 09/26/13 1722     09/26/13 1200  vancomycin (VANCOCIN) IVPB 750 mg/150 ml premix  Status:  Discontinued     750 mg 150 mL/hr over 60 Minutes Intravenous Every M-W-F (Hemodialysis) 09/25/13 2344 09/26/13 1051   09/26/13 1200  vancomycin (VANCOCIN) IVPB 1000 mg/200 mL premix  Status:  Discontinued     1,000 mg 200 mL/hr over 60 Minutes Intravenous Every M-W-F (Hemodialysis) 09/26/13 1052 09/26/13 1638   09/26/13 0600  piperacillin-tazobactam (ZOSYN) IVPB 2.25 g  Status:  Discontinued     2.25 g 100 mL/hr over 30 Minutes Intravenous 3 times per day 09/25/13 2126 09/26/13 1721   09/26/13 0000  vancomycin (VANCOCIN) 500 mg in sodium chloride irrigation 0.9 % 100 mL ENEMA  Status:  Discontinued     500 mg Rectal 4 times per  day 09/25/13 2337 09/26/13 1722   09/25/13 2130  vancomycin (VANCOCIN) IVPB 750 mg/150 ml premix     750 mg 150 mL/hr over 60 Minutes Intravenous  Once 09/25/13 2124 09/26/13 0145   09/25/13 2000  piperacillin-tazobactam (ZOSYN) IVPB 3.375 g     3.375 g 100 mL/hr over 30 Minutes Intravenous  Once 09/25/13 1853 09/25/13 2002   09/25/13 2000  vancomycin (VANCOCIN) IVPB 1000 mg/200 mL premix     1,000 mg 200 mL/hr over 60 Minutes Intravenous  Once 09/25/13 1859 09/25/13 2113      Assessment/Plan: Recent back fx  ESRD on HD CAD DM  SBO: not much improvement with conservative manamgent, no flatus/BM.  369ml out of NGT yesterday.  Unclear of etiology of  obstruction, no history of abdominal surgeries.  He has fecalization at terminal ileum, enemas unlikely to help with this.  Will consult to GI to evaluate for a colonosocpy to rule out a mass. I spoke with Dr. Cristina Gong who will see the patient later today or tomorrow.    LOS: 3 days    Meeyah Ovitt ANP-BC 09/28/2013 9:37 AM

## 2013-09-28 NOTE — Progress Notes (Signed)
Appreciate GI evaluation. On bedpan S/P enema. Patient examined and I agree with the assessment and plan  Georganna Skeans, MD, MPH, FACS Pager: [phone]  09/28/2013 2:44 PM

## 2013-09-28 NOTE — Progress Notes (Signed)
PT Cancellation Note  Patient Details Name: Nicholas [name] MRN: [id number] DOB: 1941/02/13   Cancelled Treatment:    Reason Eval/Treat Not Completed: Patient at procedure or test/unavailable; patient now in hemodialysis.  Will attempt again tomorrow.   WYNN,CYNDI 09/28/2013, 3:44 PM

## 2013-09-28 NOTE — Progress Notes (Signed)
Patient ID: Nicholas [name], male   DOB: 08/11/1941, 73 y.o.   MRN: [id number] Request received for left arm AVF thrombolysis in pt with ESRD and inability to dialyze yesterday secondary to clots in fistula. Additional PMH as below. Exam: pt awake/alert; chest- few rt basilar crackles, sl dim BS left base; heart- RRR; abd- sl dist, tender in periumbilical region, few BS; ext- no sig edema; left arm AVF with no sig thrill/bruit.   Filed Vitals:   09/27/13 1700 09/27/13 2009 09/28/13 0407 09/28/13 0926  BP: 107/70 102/63 104/61 101/56  Pulse: 85 79 80 76  Temp: 98.2 F (36.8 C) 98.1 F (36.7 C) 98.8 F (37.1 C) 98.4 F (36.9 C)  TempSrc: Oral Oral Oral Oral  Resp: 18 18 18 18   Height:  5\' 6"  (1.676 m)    Weight:  228 lb 4.8 oz (103.556 kg)    SpO2: 98% 91% 96% 98%   Past Medical History  Diagnosis Date  . Coronary artery disease     a. hx of MI 1992; b. 10/2012 NSTEMI (no cath 2/2 GIB);  c. 11/2012 MV: Inf defect w inf and septal HK (favor prior MI), EF 47%;  d. 12/2012 Cath: Severe 3vd, EF 45-50%;  e. 01/2013 CABG x 5 (LIMA->LAD, VG->D1, VG->OM1->OM2, VG->PDA)  . Diabetes mellitus   . Hypertension   . Arthritis   . Carotid artery disease     a. 01/2013 doppler: >80% RICA stenosis, <22% LICA stenosis;  b. 04/7988 R CEA.  . Gastritis 2012 and 09/2011    treated for H Pylori in 12/2011  . Diabetic neuropathy   . Osteoporosis   . COPD (chronic obstructive pulmonary disease)   . Anxiety   . Depression   . ESRD (end stage renal disease)     M-W-Sa  Nicholas [name]  . Diabetic nephropathy   . GERD (gastroesophageal reflux disease)   . Neuromuscular disorder     peripheral neurotherapy  . Anemia   . A-fib     a. Dx 10/2012, on amio, no anticoagulation 2/2 GIB 10/2012.  Nicholas [name] GIB (gastrointestinal bleeding)     a. 10/2012, awaiting colonoscopy.   Past Surgical History  Procedure Laterality Date  . Coronary stent placement    . Hip surgery      both hips  . Av fistula placement      Left arm   . Cardiac catheterization    . Joint replacement Bilateral   . Eye surgery Bilateral     cararacts  . Coronary artery bypass graft N/A 02/03/2013    Procedure: CORONARY ARTERY BYPASS GRAFTING (CABG);  Surgeon: Melrose Nakayama, MD;  Location: [facility];  Service: Open Heart Surgery;  Laterality: N/A;  . Endarterectomy N/A 02/03/2013    Procedure: ENDARTERECTOMY CAROTID;  Surgeon: Mal Misty, MD;  Location: [place];  Service: Vascular;  Laterality: N/A;  . Olecranon bursectomy Right 07/28/2013    Procedure: RIGHT ELBOW OLECRANON BURSECTOMY ADVANCEMENT CLOSURE ;  Surgeon: Linna Hoff, MD;  Location: [place];  Service: Orthopedics;  Laterality: Right;   Ct Abdomen Pelvis Wo Contrast  09/25/2013   CLINICAL DATA:  Vomiting, abdominal distention, constipation  EXAM: CT ABDOMEN AND PELVIS WITHOUT CONTRAST  TECHNIQUE: Multidetector CT imaging of the abdomen and pelvis was performed following the standard protocol without intravenous contrast.  COMPARISON:  None.  FINDINGS: There are multiple dilated small bowel loops throughout the abdomen and pelvis. In the right mid abdomen, there is a small bowel loop with  the extensive bowel content and crecent air anteriorly; this is more likely due to air surrounding stool and less likely due to pneumatosis. There is bowel wall thickening in decompressed ascending, transverse and descending colon.  The liver, spleen, pancreas are normal. The gallbladder is distended. The adrenal glands are normal. The kidneys are atrophic. There are nonobstructing left kidney stones. There are small high and low density lesions in both kidneys, evaluation is limited without contrast. There is atherosclerosis of the abdominal aorta without aneurysmal dilatation. There is no abdominal lymphadenopathy.  Evaluation is decompressed bladder is limited by metallic artifact from the bilateral hip replacements. There is a small left pleural effusion. There is compression atelectasis of the  bilateral posterior lung bases. There are patchy airspace consolidation the of the posterior right upper lobe suspicious for pneumonia.  IMPRESSION: Findings consistent with small bowel obstruction. In the right mid abdomen, there is a small bowel loop with the extensive bowel content and crecent air anteriorly; this is more likely due to air surrounding stool and less likely due to pneumatosis. Clinical correlation with lactose level is recommended.  Colonic bowel wall thickening of ascending, transverse, and descending colon. This is in part due to decompressed colon. However, colitis or ischemic colon are not excluded on this noncontrast exam.  Right upper lobe pneumonia.   Electronically Signed   By: Abelardo Diesel M.D.   On: 09/25/2013 17:56   Dg Chest 2 View  09/09/2013   CLINICAL DATA:  Leukocytosis.  Preop back surgery.  EXAM: CHEST  2 VIEW  COMPARISON:  07/21/2013  FINDINGS: The patient is status post median sternotomy and CABG procedure. There is mild to moderate cardiac enlargement. Chronic asymmetric elevation of the left hemidiaphragm is noted. There is a new left pleural effusion identified. Atelectasis is present in both lung bases. There is advanced osteoarthritis involving both glenohumeral joints.  IMPRESSION: 1. New left pleural effusion.   Electronically Signed   By: Kerby Moors M.D.   On: 09/09/2013 18:54   Ct Lumbar Spine Wo Contrast  09/09/2013   CLINICAL DATA:  Back pain.  Fall 6 weeks ago.  L1 fracture.  EXAM: CT LUMBAR SPINE WITHOUT CONTRAST  TECHNIQUE: Multidetector CT imaging of the lumbar spine was performed without intravenous contrast administration. Multiplanar CT image reconstructions were also generated.  COMPARISON:  09/08/2013  FINDINGS: The spinous processes, facets, an vertebral bodies are all fused in the lumbar spine suggesting severe and advanced ankylosing spondylitis.  There is a transverse cleft in the L1 vertebral body extending longitudinally into the pedicles.  This cleft has fluid signal and tiny locules of gas signal, and the fracture appears to continue through the posterior elements in a nondisplaced manner. No subluxation. Irregular margins of the fracture somewhat intact interdigitate.  The T11-12 level does not appear completely fused although there is fusion at T11.  Severe bony demineralization.  The sacroiliac joints are completely fused.  Scattered exophytic lesions of both kidneys have varying complexity.  There is paraspinal hematoma in the vicinity of the fracture. I do not demonstrate significant bony retropulsion.  Aortoiliac atherosclerotic vascular disease is present.  There is considerable abnormal wall thickening and 3 long segment of the sigmoid colon, potentially from prominent inflammation or less likely tumor. Adjacent mesenteric edema noted.  Mild levoconvex lumbar scoliosis. Scattered small retroperitoneal lymph nodes are present.  IMPRESSION: 1. Acute nondisplaced transverse fracture through the vertebral body and posterior elements of L1 (Chance fracture), with associated paraspinal edema. 2. Markedly severe  manifestations of ankylosing spondylitis with complete anterior and posterior fusion in the lumbar spine and complete fusion of the sacroiliac joints. 3. Severe wall thickening in the sigmoid colon with surrounding stranding, potentially from severe colitis or tumor. 4. Bilateral complex renal exophytic lesions, probably complex cysts although these are not characterized on today's exam with regard to malignant potential.   Electronically Signed   By: Sherryl Barters M.D.   On: 09/09/2013 17:44   Dg Abd 2 Views  09/27/2013   CLINICAL DATA:  Mid abdominal pain, back pain  EXAM: ABDOMEN - 2 VIEW  COMPARISON:  DG ABD PORTABLE 1V dated 09/26/2013  FINDINGS: Multiple dilated loops small bowel appreciated throughout the abdomen. Air-fluid levels identified. There is no evidence of free air. A moderate amount of stools appreciated region of the  ascending colon. There is a paucity of distal bowel gas. The NG tube is seen with tip curled in the region of the stomach. When compared to previous study the dilated loops of small bowel have decreased in size and distribution.  IMPRESSION: Findings consistent with an improving small bowel obstruction. There is no evidence of free air.   Electronically Signed   By: Margaree Mackintosh M.D.   On: 09/27/2013 13:27   Dg Abd Portable 1v  09/26/2013   CLINICAL DATA:  Small bowel obstruction, abdominal pain and distention  EXAM: PORTABLE ABDOMEN - 1 VIEW  COMPARISON:  Prior abdominal radiograph 09/25/2013  FINDINGS: Persistent gaseous distension of the stomach and multiple loops of small bowel. The tip of the nasogastric tube projects over the gastric bubble, however the proximal side hole is in the region of the gastroesophageal junction. The degree of small-bowel distention is not significantly changed since the prior study. Maximal small bowel diameter is again 5 cm. No large free air on this single supine radiograph. Surgical changes of prior median sternotomy and bilateral total hip arthroplasties. The bones are osteopenic. Atherosclerotic vascular calcifications noted in the aorta.  IMPRESSION: 1. No significant interval change in the appearance of high-grade small bowel obstruction. Maximal small bowel diameter is again 5 cm. 2. The proximal side port of the nasogastric tube is in the GE junction. Consider advancing 5 cm for more optimal placement.   Electronically Signed   By: Jacqulynn Cadet M.D.   On: 09/26/2013 07:35   Dg Abd Portable 1v  09/25/2013   CLINICAL DATA:  Nasogastric tube placement  EXAM: PORTABLE ABDOMEN - 1 VIEW  COMPARISON:  CT abdomen September 25, 2013  FINDINGS: Nasogastric tube is identified with distal tip in the mid stomach. The stomach is air-filled and distended. There are multiple dilated small bowel loops.  IMPRESSION: Naso gastric tube distal tip in the mid stomach. Small bowel  obstruction.   Electronically Signed   By: Abelardo Diesel M.D.   On: 09/25/2013 22:56   Mr Outside Films Spine  09/09/2013   This examination belongs to an outside facility and is stored  here for comparison purposes only.  Contact the originating outside  institution for any associated report or interpretation. Results for orders placed during the hospital encounter of 09/25/13  MRSA PCR SCREENING      Result Value Ref Range   MRSA by PCR NEGATIVE  NEGATIVE  CBC WITH DIFFERENTIAL      Result Value Ref Range   WBC 14.1 (*) 4.0 - 10.5 K/uL   RBC 3.52 (*) 4.22 - 5.81 MIL/uL   Hemoglobin 10.4 (*) 13.0 - 17.0 g/dL   HCT  33.8 (*) 39.0 - 52.0 %   MCV 96.0  78.0 - 100.0 fL   MCH 29.5  26.0 - 34.0 pg   MCHC 30.8  30.0 - 36.0 g/dL   RDW 19.0 (*) 11.5 - 15.5 %   Platelets 367  150 - 400 K/uL   Neutrophils Relative % 86 (*) 43 - 77 %   Neutro Abs 12.1 (*) 1.7 - 7.7 K/uL   Lymphocytes Relative 6 (*) 12 - 46 %   Lymphs Abs 0.9  0.7 - 4.0 K/uL   Monocytes Relative 8  3 - 12 %   Monocytes Absolute 1.1 (*) 0.1 - 1.0 K/uL   Eosinophils Relative 0  0 - 5 %   Eosinophils Absolute 0.0  0.0 - 0.7 K/uL   Basophils Relative 0  0 - 1 %   Basophils Absolute 0.0  0.0 - 0.1 K/uL  COMPREHENSIVE METABOLIC PANEL      Result Value Ref Range   Sodium 138  137 - 147 mEq/L   Potassium 3.6 (*) 3.7 - 5.3 mEq/L   Chloride 94 (*) 96 - 112 mEq/L   CO2 30  19 - 32 mEq/L   Glucose, Bld 74  70 - 99 mg/dL   BUN 36 (*) 6 - 23 mg/dL   Creatinine, Ser 4.86 (*) 0.50 - 1.35 mg/dL   Calcium 8.2 (*) 8.4 - 10.5 mg/dL   Total Protein 5.7 (*) 6.0 - 8.3 g/dL   Albumin 2.0 (*) 3.5 - 5.2 g/dL   AST 11  0 - 37 U/L   ALT 6  0 - 53 U/L   Alkaline Phosphatase 138 (*) 39 - 117 U/L   Total Bilirubin 0.5  0.3 - 1.2 mg/dL   GFR calc non Af Amer 11 (*) >90 mL/min   GFR calc Af Amer 13 (*) >90 mL/min  BASIC METABOLIC PANEL      Result Value Ref Range   Sodium 137  137 - 147 mEq/L   Potassium 4.2  3.7 - 5.3 mEq/L   Chloride 94 (*)  96 - 112 mEq/L   CO2 26  19 - 32 mEq/L   Glucose, Bld 91  70 - 99 mg/dL   BUN 43 (*) 6 - 23 mg/dL   Creatinine, Ser 5.34 (*) 0.50 - 1.35 mg/dL   Calcium 7.7 (*) 8.4 - 10.5 mg/dL   GFR calc non Af Amer 10 (*) >90 mL/min   GFR calc Af Amer 11 (*) >90 mL/min  CBC      Result Value Ref Range   WBC 16.3 (*) 4.0 - 10.5 K/uL   RBC 3.46 (*) 4.22 - 5.81 MIL/uL   Hemoglobin 10.2 (*) 13.0 - 17.0 g/dL   HCT 33.1 (*) 39.0 - 52.0 %   MCV 95.7  78.0 - 100.0 fL   MCH 29.5  26.0 - 34.0 pg   MCHC 30.8  30.0 - 36.0 g/dL   RDW 18.7 (*) 11.5 - 15.5 %   Platelets 327  150 - 400 K/uL  GLUCOSE, CAPILLARY      Result Value Ref Range   Glucose-Capillary 93  70 - 99 mg/dL  GLUCOSE, CAPILLARY      Result Value Ref Range   Glucose-Capillary 105 (*) 70 - 99 mg/dL  GLUCOSE, CAPILLARY      Result Value Ref Range   Glucose-Capillary 95  70 - 99 mg/dL  GLUCOSE, CAPILLARY      Result Value Ref Range   Glucose-Capillary 98  70 -  99 mg/dL  GLUCOSE, CAPILLARY      Result Value Ref Range   Glucose-Capillary 93  70 - 99 mg/dL  RENAL FUNCTION PANEL      Result Value Ref Range   Sodium 140  137 - 147 mEq/L   Potassium 4.4  3.7 - 5.3 mEq/L   Chloride 97  96 - 112 mEq/L   CO2 23  19 - 32 mEq/L   Glucose, Bld 84  70 - 99 mg/dL   BUN 58 (*) 6 - 23 mg/dL   Creatinine, Ser 6.29 (*) 0.50 - 1.35 mg/dL   Calcium 7.3 (*) 8.4 - 10.5 mg/dL   Phosphorus 5.4 (*) 2.3 - 4.6 mg/dL   Albumin 1.7 (*) 3.5 - 5.2 g/dL   GFR calc non Af Amer 8 (*) >90 mL/min   GFR calc Af Amer 9 (*) >90 mL/min  CBC      Result Value Ref Range   WBC 16.6 (*) 4.0 - 10.5 K/uL   RBC 3.46 (*) 4.22 - 5.81 MIL/uL   Hemoglobin 10.4 (*) 13.0 - 17.0 g/dL   HCT 32.7 (*) 39.0 - 52.0 %   MCV 94.5  78.0 - 100.0 fL   MCH 30.1  26.0 - 34.0 pg   MCHC 31.8  30.0 - 36.0 g/dL   RDW 18.5 (*) 11.5 - 15.5 %   Platelets 317  150 - 400 K/uL  GLUCOSE, CAPILLARY      Result Value Ref Range   Glucose-Capillary 87  70 - 99 mg/dL  GLUCOSE, CAPILLARY      Result  Value Ref Range   Glucose-Capillary 97  70 - 99 mg/dL  GLUCOSE, CAPILLARY      Result Value Ref Range   Glucose-Capillary 82  70 - 99 mg/dL  GLUCOSE, CAPILLARY      Result Value Ref Range   Glucose-Capillary 79  70 - 99 mg/dL  GLUCOSE, CAPILLARY      Result Value Ref Range   Glucose-Capillary 67 (*) 70 - 99 mg/dL  GLUCOSE, CAPILLARY      Result Value Ref Range   Glucose-Capillary 130 (*) 70 - 99 mg/dL  GLUCOSE, CAPILLARY      Result Value Ref Range   Glucose-Capillary 92  70 - 99 mg/dL  GLUCOSE, CAPILLARY      Result Value Ref Range   Glucose-Capillary 92  70 - 99 mg/dL  GLUCOSE, CAPILLARY      Result Value Ref Range   Glucose-Capillary 93  70 - 99 mg/dL  RENAL FUNCTION PANEL      Result Value Ref Range   Sodium 139  137 - 147 mEq/L   Potassium 4.0  3.7 - 5.3 mEq/L   Chloride 98  96 - 112 mEq/L   CO2 20  19 - 32 mEq/L   Glucose, Bld 92  70 - 99 mg/dL   BUN 44 (*) 6 - 23 mg/dL   Creatinine, Ser 5.07 (*) 0.50 - 1.35 mg/dL   Calcium 7.3 (*) 8.4 - 10.5 mg/dL   Phosphorus 4.6  2.3 - 4.6 mg/dL   Albumin 1.7 (*) 3.5 - 5.2 g/dL   GFR calc non Af Amer 10 (*) >90 mL/min   GFR calc Af Amer 12 (*) >90 mL/min  CG4 I-STAT (LACTIC ACID)      Result Value Ref Range   Lactic Acid, Venous 0.86  0.5 - 2.2 mmol/L   A/P: Pt with ESRD and poorly functioning/probable thrombosed left arm AVF. Plan is for left arm  fistulagram, possible angioplasty/thrombolysis of fistula or placement of HD catheter if necessary. Details/risks of procedure d/w pt with his understanding and consent.

## 2013-09-28 NOTE — Consult Note (Signed)
Referring Provider: Dr. Georganna Skeans Primary Care Physician:  Ramonita Lab Primary Gastroenterologist:  None (unassigned)   Reason for Consultation:  Possible proximal colonic obstruction   HPI: Nicholas [name] is a 73 y.o. male  admitted through the emergency room 2 days ago with feculent emesis.  Several weeks ago, he was in this hospital for a prolonged stay to 2 and L1 compression fracture after a fall. He was C. difficile positive during that admission on January 24, but after going to the skilled nursing facility and apparently been on a lot of narcotics., he became constipated and went several days without a bowel movement. He developed feculent emesis and had minimal results with an enema at the skilled nursing facility, prompting transfer back to the hospital here. His admission CT showed a small bowel obstruction pattern with a lot of feculent material in the distal small bowel with a relatively decompressed colon, raising the question of a possible obstructive process in the region of the proximal colon or cecum. The patient indicates he had a colonoscopy, apparently negative, performed in [place] roughly 4 years ago. Because he is not "opening up" on NG suction here in the hospital, the surgeons have asked [place] to perform colonoscopy to exclude a proximal colonic stricture or neoplasm as the cause for the small bowel obstruction picture.  The patient has multiple medical problems including end-stage renal disease on dialysis for approximately the past 10 months, coronary disease status post CABG about 8 months ago, A. fib on amiodarone but not on anticoagulation, and COPD, diabetes, hypertension and neuropathy.    I have reviewed his CT scan images, and it appears to me that there is a moderate amount of stool material in the colon, especially the sigmoid colon.   Past Medical History  Diagnosis Date  . Coronary artery disease     a. hx of MI 1992; b. 10/2012 NSTEMI (no cath 2/2 GIB);   c. 11/2012 MV: Inf defect w inf and septal HK (favor prior MI), EF 47%;  d. 12/2012 Cath: Severe 3vd, EF 45-50%;  e. 01/2013 CABG x 5 (LIMA->LAD, VG->D1, VG->OM1->OM2, VG->PDA)  . Diabetes mellitus   . Hypertension   . Arthritis   . Carotid artery disease     a. 01/2013 doppler: >80% RICA stenosis, XX123456 LICA stenosis;  b. [id number] R CEA.  . Gastritis 2012 and 09/2011    treated for H Pylori in 12/2011  . Diabetic neuropathy   . Osteoporosis   . COPD (chronic obstructive pulmonary disease)   . Anxiety   . Depression   . ESRD (end stage renal disease)     M-W-Sa  Nicholas [name]  . Diabetic nephropathy   . GERD (gastroesophageal reflux disease)   . Neuromuscular disorder     peripheral neurotherapy  . Anemia   . A-fib     a. Dx 10/2012, on amio, no anticoagulation 2/2 GIB 10/2012.  Nicholas [name] GIB (gastrointestinal bleeding)     a. 10/2012, awaiting colonoscopy.    Past Surgical History  Procedure Laterality Date  . Coronary stent placement    . Hip surgery      both hips  . Av fistula placement      Left arm  . Cardiac catheterization    . Joint replacement Bilateral   . Eye surgery Bilateral     cararacts  . Coronary artery bypass graft N/A 02/03/2013    Procedure: CORONARY ARTERY BYPASS GRAFTING (CABG);  Surgeon: Melrose Nakayama, MD;  Location: [facility]  OR;  Service: Open Heart Surgery;  Laterality: N/A;  . Endarterectomy N/A 02/03/2013    Procedure: ENDARTERECTOMY CAROTID;  Surgeon: Mal Misty, MD;  Location: [facility];  Service: Vascular;  Laterality: N/A;  . Olecranon bursectomy Right 07/28/2013    Procedure: RIGHT ELBOW OLECRANON BURSECTOMY ADVANCEMENT CLOSURE ;  Surgeon: Linna Hoff, MD;  Location: [place];  Service: Orthopedics;  Laterality: Right;    Prior to Admission medications   Medication Sig Start Date End Date Taking? Authorizing Provider  acetaminophen (TYLENOL) 500 MG tablet Take 500 mg by mouth every 4 (four) hours as needed (pain).    Yes Historical Provider, MD   allopurinol (ZYLOPRIM) 100 MG tablet Take 100 mg by mouth 2 (two) times daily.    Yes Historical Provider, MD  amiodarone (PACERONE) 200 MG tablet Take 200 mg by mouth daily.   Yes Historical Provider, MD  aspirin 325 MG tablet Take 325 mg by mouth daily.   Yes Historical Provider, MD  bisacodyl (DULCOLAX) 10 MG suppository Place 1 suppository (10 mg total) rectally daily as needed for moderate constipation (gas pain). 09/20/13  Yes Consuella Lose, MD  calcitonin, salmon, (MIACALCIN/FORTICAL) 200 UNIT/ACT nasal spray Place 1 spray into alternate nostrils daily as needed.   Yes Historical Provider, MD  darbepoetin (ARANESP) 60 MCG/0.3ML SOLN injection Inject 0.3 mLs (60 mcg total) into the vein every Monday with hemodialysis. 09/20/13  Yes Consuella Lose, MD  docusate sodium 100 MG CAPS Take 100 mg by mouth 2 (two) times daily as needed for mild constipation. 09/20/13  Yes Consuella Lose, MD  doxercalciferol (HECTOROL) 4 MCG/2ML injection Inject 1 mL (2 mcg total) into the vein every Monday, Wednesday, and Friday with hemodialysis. 09/20/13  Yes Consuella Lose, MD  feeding supplement, RESOURCE BREEZE, (RESOURCE BREEZE) LIQD Take 1 Container by mouth daily. 09/20/13  Yes Consuella Lose, MD  fish oil-omega-3 fatty acids 1000 MG capsule Take 1 g by mouth 2 (two) times daily.    Yes Historical Provider, MD  gabapentin (NEURONTIN) 300 MG capsule Take 1 capsule (300 mg total) by mouth 2 (two) times daily. 02/15/13  Yes Donielle Liston Alba, PA-C  HYDROcodone-acetaminophen (NORCO/VICODIN) 5-325 MG per tablet Take 1 tablet by mouth 2 (two) times daily.   Yes Historical Provider, MD  insulin aspart (NOVOLOG) 100 UNIT/ML injection Inject 0-9 Units into the skin 3 (three) times daily with meals. 09/20/13  Yes Consuella Lose, MD  ipratropium (ATROVENT) 0.06 % nasal spray Place 2 sprays into both nostrils 3 (three) times daily.    Yes Historical Provider, MD  lidocaine (LIDODERM) 5 % Place 1 patch onto the  skin daily. Remove & Discard patch within 12 hours or as directed by MD   Yes Historical Provider, MD  magnesium hydroxide (MILK OF MAGNESIA) 400 MG/5ML suspension Take by mouth every 12 (twelve) hours as needed for mild constipation.   Yes Historical Provider, MD  metoCLOPramide (REGLAN) 5 MG tablet Take 5 mg by mouth 3 (three) times daily.   Yes Historical Provider, MD  midodrine (PROAMATINE) 5 MG tablet Take 5 mg by mouth 3 (three) times a week. Take once daily in the morning on dialysis days (usually on Monday, Wednesday and Friday)   Yes Historical Provider, MD  multivitamin (RENA-VIT) TABS tablet Take 1 tablet by mouth at bedtime. 09/20/13  Yes Consuella Lose, MD  Nutritional Supplements (FEEDING SUPPLEMENT, NEPRO CARB STEADY,) LIQD Take 237 mLs by mouth 2 (two) times daily. 09/20/13  Yes Consuella Lose, MD  omeprazole (  PRILOSEC) 20 MG capsule Take 20 mg by mouth 2 (two) times daily.   Yes Historical Provider, MD  oxyCODONE (OXY IR/ROXICODONE) 5 MG immediate release tablet Take 5-10 mg by mouth every 4 (four) hours as needed for moderate pain or severe pain.   Yes Historical Provider, MD  pantoprazole (PROTONIX) 40 MG tablet Take 40 mg by mouth daily.   Yes Historical Provider, MD  polyethylene glycol (MIRALAX / GLYCOLAX) packet Take 17 g by mouth daily.   Yes Historical Provider, MD  promethazine (PHENERGAN) 25 MG tablet Take 25 mg by mouth every 12 (twelve) hours as needed for nausea or vomiting.   Yes Historical Provider, MD  saccharomyces boulardii (FLORASTOR) 250 MG capsule Take 1 capsule (250 mg total) by mouth 2 (two) times daily. 09/20/13  Yes Consuella Lose, MD  senna (SENOKOT) 8.6 MG TABS tablet Take 1 tablet by mouth 2 (two) times daily.   Yes Historical Provider, MD  sevelamer carbonate (RENVELA) 800 MG tablet Take 800 mg by mouth 3 (three) times daily with meals.   Yes Historical Provider, MD  simethicone (MYLICON) 80 MG chewable tablet Chew 2 tablets (160 mg total) by mouth 4  (four) times daily as needed for flatulence. 09/20/13  Yes Consuella Lose, MD  sorbitol 70 % solution Take 15 mLs by mouth daily as needed.   Yes Historical Provider, MD  vancomycin (VANCOCIN) 50 mg/mL oral solution Take 2.5 mLs (125 mg total) by mouth 4 (four) times daily. 09/20/13  Yes Consuella Lose, MD  Witch Hazel (PREPARATION H EX) Apply 1 application topically every 6 (six) hours as needed (hemorrhoids).   Yes Historical Provider, MD  zolpidem (AMBIEN) 10 MG tablet Take 10 mg by mouth at bedtime.   Yes Historical Provider, MD    Current Facility-Administered Medications  Medication Dose Route Frequency Provider Last Rate Last Dose  . 0.9 %  sodium chloride infusion   Intravenous Continuous Delfina Redwood, MD 10 mL/hr at 09/28/13 1305 10 mL/hr at 09/28/13 1305  . acetaminophen (TYLENOL) tablet 650 mg  650 mg Oral Q6H PRN Nishant Dhungel, MD       Or  . acetaminophen (TYLENOL) suppository 650 mg  650 mg Rectal Q6H PRN Nishant Dhungel, MD      . calcitonin (salmon) (MIACALCIN/FORTICAL) nasal spray 1 spray  1 spray Alternating Nares Daily Nishant Dhungel, MD   1 spray at 09/28/13 1836  . [START ON 09/29/2013] cefUROXime (ZINACEF) 1.5 g in dextrose 5 % 50 mL IVPB  1.5 g Intravenous On Call to [place], PA-C      . darbepoetin (ARANESP) injection 60 mcg  60 mcg Intravenous Q Mon-HD Delfina Redwood, MD   60 mcg at 09/27/13 0750  . dextrose 5 % and 0.2 % NaCl infusion   Intravenous Continuous Sol Blazing, MD 65 mL/hr at 09/28/13 1835 65 mL/hr at 09/28/13 1835  . doxercalciferol (HECTOROL) injection 2 mcg  2 mcg Intravenous Q M,W,F-HD Delfina Redwood, MD   2 mcg at 09/27/13 0750  . heparin injection 5,000 Units  5,000 Units Subcutaneous 3 times per day Louellen Molder, MD   5,000 Units at 09/28/13 1305  . HYDROmorphone (DILAUDID) injection 0.5 mg  0.5 mg Intravenous Q4H PRN Nishant Dhungel, MD   0.5 mg at 09/28/13 1036  . insulin aspart (novoLOG) injection 0-9 Units  0-9  Units Subcutaneous 6 times per day Nishant Dhungel, MD      . metroNIDAZOLE (FLAGYL) IVPB 500 mg  500  mg Intravenous Q8H Delfina Redwood, MD   500 mg at 09/28/13 1835  . midodrine (PROAMATINE) tablet 5 mg  5 mg Oral Q M,W,F-HD Sol Blazing, MD      . promethazine (PHENERGAN) injection 12.5 mg  12.5 mg Intravenous Q6H PRN Nishant Dhungel, MD   12.5 mg at 09/27/13 0425    Allergies as of 09/25/2013 - Review Complete 09/25/2013  Allergen Reaction Noted  . Statins Other (See Comments) 10/19/2012    Family History  Problem Relation Age of Onset  . Heart attack Father 22  . Heart disease Father   . Stroke Mother   . Hypertension Mother   . Other Mother     varicose veins  . Stroke Maternal Grandmother   . Hypertension Brother     History   Social History  . Marital Status: Widowed    Spouse Name: N/A    Number of Children: N/A  . Years of Education: N/A   Occupational History  . other     retired Administrator.   Social History Main Topics  . Smoking status: Former Smoker -- 1.00 packs/day for 25 years    Types: Cigarettes    Start date: 08/18/1965    Quit date: 08/18/1990  . Smokeless tobacco: Never Used  . Alcohol Use: Yes     Comment: occasionally  . Drug Use: No  . Sexual Activity: Not on file   Other Topics Concern  . Not on file   Social History Narrative   Was living in [place] by himself prior to CABG and hopes to return there after rehab @ [facility].    Review of Systems: see history of present illness   Physical Exam: Vital signs in last 24 hours: Temp:  [98 F (36.7 C)-98.8 F (37.1 C)] 98.2 F (36.8 C) (02/11 1753) Pulse Rate:  [76-101] 92 (02/11 1753) Resp:  [12-18] 16 (02/11 1753) BP: (94-159)/(53-91) 101/55 mmHg (02/11 1753) SpO2:  [91 %-100 %] 91 % (02/11 1753) Weight:  [102.7 kg (226 lb 6.6 oz)-103.556 kg (228 lb 4.8 oz)] 102.7 kg (226 lb 6.6 oz) (02/11 1753) Last BM Date: 09/25/13 This is a somewhat frail, pleasant and coherent  elderly male in no acute distress. He is anicteric and without pallor. The NG tube is draining fairly clear amber fluid. Chest is clear anteriorly. At this time, the heart has a regular rhythm, no murmur. Abdomen is soft and without discrete masses, but there is mild tenderness in the right lower quadrant.   Intake/Output from previous day: 02/10 0701 - 02/11 0700 In: 190 [NG/GT:90; IV Piggyback:100] Out: 818 [Emesis/NG output:350] Intake/Output this shift:    Lab Results:  Recent Labs  09/26/13 0647 09/27/13 0500  WBC 16.3* 16.6*  HGB 10.2* 10.4*  HCT 33.1* 32.7*  PLT 327 317   BMET  Recent Labs  09/26/13 0647 09/27/13 0500 09/28/13 0738  NA 137 140 139  K 4.2 4.4 4.0  CL 94* 97 98  CO2 26 23 20   GLUCOSE 91 84 92  BUN 43* 58* 44*  CREATININE 5.34* 6.29* 5.07*  CALCIUM 7.7* 7.3* 7.3*   LFT  Recent Labs  09/28/13 0738  ALBUMIN 1.7*   PT/INR No results found for this basename: LABPROT, INR,  in the last 72 hours  C-Diff  positive on January 24, negative when rechecked on February 1  Studies/Results: Ir Fluoro Guide Cv Line Right  09/28/2013   CLINICAL DATA:  Non functional left arm AV dialysis fistula,  not amenable to percutaneous intervention. Dialysis access needed.  EXAM: TUNNELED HEMODIALYSIS CATHETER PLACEMENT WITH ULTRASOUND AND FLUOROSCOPIC GUIDANCE  TECHNIQUE: The procedure, risks, benefits, and alternatives were explained to the patient. Questions regarding the procedure were encouraged and answered. The patient understands and consents to the procedure. As antibiotic prophylaxis, cefazolin 1 g was ordered pre-procedure and administered intravenously within one hour of incision.Patency of the right IJ vein was confirmed with ultrasound with image documentation. An appropriate skin site was determined. Region was prepped using maximum barrier technique including cap and mask, sterile gown, sterile gloves, large sterile sheet, and Chlorhexidine as cutaneous  antisepsis. The region was infiltrated locally with 1% lidocaine.  Intravenous Fentanyl and Versed were administered as conscious sedation during continuous cardiorespiratory monitoring by the radiology RN, with a total moderate sedation time of 80 minutes.  Under real-time ultrasound guidance, the right IJ vein was accessed with a 21 gauge micropuncture needle; the needle tip within the vein was confirmed with ultrasound image documentation. Needle exchanged over the 018 guidewire for transitional dilator, which allowed advancement of a Benson wire into the IVC. Over this, an MPA catheter was advanced. A Hemosplit 19 hemodialysis catheter was tunneled from the right anterior chest wall approach to the right IJ dermatotomy site. The MPA catheter was exchanged over an Amplatz wire for serial vascular dilators which allow placement of a peel-away sheath, through which the catheter was advanced under intermittent fluoroscopy, positioned with its tips in the proximal and midright atrium. Spot chest radiograph confirms good catheter position. No pneumothorax. Catheter was flushed and primed per protocol. Catheter secured externally with O Prolene sutures. The right IJ dermatotomy site was closed with 3-0 Monocryl subcutaneous suture and covered with Dermabond. No immediate complication.  COMPARISON:  None  FLUOROSCOPY TIME:  10 min 12 seconds  ACCESS: Remains approachable for percutaneous intervention as needed.  IMPRESSION: 1. Technically successful placement of tunneled right IJ hemodialysis catheter with ultrasound and fluoroscopic guidance. Ready for routine use.   Electronically Signed   By: Arne Cleveland M.D.   On: 09/28/2013 14:10   Ir Angio Av Shunt Addl Access  09/28/2013   CLINICAL DATA:  End stage renal disease, poor function of left arm dialysis fistula.  TECHNIQUE: DIALYSIS SHUNTOGRAM  The procedure, risks (including but not limited to bleeding, infection, organ damage ), benefits, and alternatives were  explained to the patient. Questions regarding the procedure were encouraged and answered. The patient understands and consents to the procedure.  In preparation for possible declot procedure, the skin overlying the left forearm fistula was prepped with Betadine, draped in usual sterile fashion, infiltrated locally with 1% lidocaine.  Intravenous Fentanyl and Versed were administered as conscious sedation during continuous cardiorespiratory monitoring by the radiology RN, with a total moderate sedation time of eighty minutes.  The outflow vein from the fistula was accessed just central to the fistula with a 21 gauge micropuncture needle under real-time ultrasound guidance. Needle was exchanged over a 018 guide wire for a 4 French transitional dilator. This was used for outflow venography.  The outflow vein was then accessed more centrally retrograde with a 21 gauge micropuncture needle under real-time ultrasound guidance. The needle was exchanged over the 018 wire for the transitional dilator, which allow placement of a Bentson wire. Over this, a 5 [place] Kumpe catheter was advanced and used to negotiate the arterial anastomosis for additional fistulography. Attempts were made to catheterize the proximal radial artery using a variety of catheter & guidewire combinations but  these were ultimately unsuccessful. Assessment of the arterial anatomy of the forearm was performed with a blood pressure cuff at the upper arm level.  Ultimately, No declot procedure was needed. The access sites were removed and hemostasis achieved with manual compression.  No immediate complication.  COMPARISON:  None  FLUOROSCOPY TIME:  10 min 12 seconds  ACCESS: Proximal left radial artery occlusion. Not amenable to percutaneous intervention. Consider surgical consultation.  FINDINGS: The outflow cephalic vein from the fistula is widely patent. Central venous system through the SVC is patent. There is a competing outflow vein at the level of  the midforearm. There is stenosis at the arterial anastomosis. The proximal radial artery is occluded, not opacified central to the level of the fistula. There is retrograde flow in the distal radial artery supplied by palmar arches, supplying low-grade flow across the fistula.  IMPRESSION: 1. Proximal left radial artery occlusion. Collateral flow via palmar arches reconstitutes the distal radial artery, with retrograde flow into the fistula, at an inadequate rate. This is not amenable to percutaneous intervention. After telephone consultation with Dr. Jonnie Finner, plan made to proceed with hemodialysis catheter placement.   Electronically Signed   By: Arne Cleveland M.D.   On: 09/28/2013 14:17   Ir US Guide Vasc Access Left  09/28/2013   CLINICAL DATA:  End stage renal disease, poor function of left arm dialysis fistula.  TECHNIQUE: DIALYSIS SHUNTOGRAM  The procedure, risks (including but not limited to bleeding, infection, organ damage ), benefits, and alternatives were explained to the patient. Questions regarding the procedure were encouraged and answered. The patient understands and consents to the procedure.  In preparation for possible declot procedure, the skin overlying the left forearm fistula was prepped with Betadine, draped in usual sterile fashion, infiltrated locally with 1% lidocaine.  Intravenous Fentanyl and Versed were administered as conscious sedation during continuous cardiorespiratory monitoring by the radiology RN, with a total moderate sedation time of eighty minutes.  The outflow vein from the fistula was accessed just central to the fistula with a 21 gauge micropuncture needle under real-time ultrasound guidance. Needle was exchanged over a 018 guide wire for a 4 French transitional dilator. This was used for outflow venography.  The outflow vein was then accessed more centrally retrograde with a 21 gauge micropuncture needle under real-time ultrasound guidance. The needle was exchanged  over the 018 wire for the transitional dilator, which allow placement of a Bentson wire. Over this, a 5 [place] Kumpe catheter was advanced and used to negotiate the arterial anastomosis for additional fistulography. Attempts were made to catheterize the proximal radial artery using a variety of catheter & guidewire combinations but these were ultimately unsuccessful. Assessment of the arterial anatomy of the forearm was performed with a blood pressure cuff at the upper arm level.  Ultimately, No declot procedure was needed. The access sites were removed and hemostasis achieved with manual compression.  No immediate complication.  COMPARISON:  None  FLUOROSCOPY TIME:  10 min 12 seconds  ACCESS: Proximal left radial artery occlusion. Not amenable to percutaneous intervention. Consider surgical consultation.  FINDINGS: The outflow cephalic vein from the fistula is widely patent. Central venous system through the SVC is patent. There is a competing outflow vein at the level of the midforearm. There is stenosis at the arterial anastomosis. The proximal radial artery is occluded, not opacified central to the level of the fistula. There is retrograde flow in the distal radial artery supplied by palmar arches, supplying low-grade flow across  the fistula.  IMPRESSION: 1. Proximal left radial artery occlusion. Collateral flow via palmar arches reconstitutes the distal radial artery, with retrograde flow into the fistula, at an inadequate rate. This is not amenable to percutaneous intervention. After telephone consultation with Dr. Jonnie Finner, plan made to proceed with hemodialysis catheter placement.   Electronically Signed   By: Arne Cleveland M.D.   On: 09/28/2013 14:17   Ir US Guide Vasc Access Right  09/28/2013   CLINICAL DATA:  Non functional left arm AV dialysis fistula, not amenable to percutaneous intervention. Dialysis access needed.  EXAM: TUNNELED HEMODIALYSIS CATHETER PLACEMENT WITH ULTRASOUND AND FLUOROSCOPIC  GUIDANCE  TECHNIQUE: The procedure, risks, benefits, and alternatives were explained to the patient. Questions regarding the procedure were encouraged and answered. The patient understands and consents to the procedure. As antibiotic prophylaxis, cefazolin 1 g was ordered pre-procedure and administered intravenously within one hour of incision.Patency of the right IJ vein was confirmed with ultrasound with image documentation. An appropriate skin site was determined. Region was prepped using maximum barrier technique including cap and mask, sterile gown, sterile gloves, large sterile sheet, and Chlorhexidine as cutaneous antisepsis. The region was infiltrated locally with 1% lidocaine.  Intravenous Fentanyl and Versed were administered as conscious sedation during continuous cardiorespiratory monitoring by the radiology RN, with a total moderate sedation time of 80 minutes.  Under real-time ultrasound guidance, the right IJ vein was accessed with a 21 gauge micropuncture needle; the needle tip within the vein was confirmed with ultrasound image documentation. Needle exchanged over the 018 guidewire for transitional dilator, which allowed advancement of a Benson wire into the IVC. Over this, an MPA catheter was advanced. A Hemosplit 19 hemodialysis catheter was tunneled from the right anterior chest wall approach to the right IJ dermatotomy site. The MPA catheter was exchanged over an Amplatz wire for serial vascular dilators which allow placement of a peel-away sheath, through which the catheter was advanced under intermittent fluoroscopy, positioned with its tips in the proximal and midright atrium. Spot chest radiograph confirms good catheter position. No pneumothorax. Catheter was flushed and primed per protocol. Catheter secured externally with O Prolene sutures. The right IJ dermatotomy site was closed with 3-0 Monocryl subcutaneous suture and covered with Dermabond. No immediate complication.  COMPARISON:   None  FLUOROSCOPY TIME:  10 min 12 seconds  ACCESS: Remains approachable for percutaneous intervention as needed.  IMPRESSION: 1. Technically successful placement of tunneled right IJ hemodialysis catheter with ultrasound and fluoroscopic guidance. Ready for routine use.   Electronically Signed   By: Arne Cleveland M.D.   On: 09/28/2013 14:10   Dg Abd 2 Views  09/28/2013   CLINICAL DATA:  Small bowel obstruction. Abdominal distention. NG tube placement.  EXAM: ABDOMEN - 2 VIEW  COMPARISON:  09/27/2013.  FINDINGS: Supine and left lateral decubitus views of the abdomen again demonstrate diffuse dilatation of the small bowel loops in the central abdomen measuring up to 4.3 cm in diameter. Multiple air-fluid levels are noted on the left lateral decubitus view. No frank pneumoperitoneum. Paucity of distal colonic gas, although there is a small amount of rectal gas present. Nasogastric tube appears coiled in the stomach. 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  IMPRESSION: 1. Bowel gas pattern remains compatible with a small bowel obstruction. 2. No pneumoperitoneum at this time. 3. Nasogastric tube remains coiled in the stomach.   Electronically Signed   By: Vinnie Langton M.D.   On:  09/28/2013 10:38   Dg Abd 2 Views  09/27/2013   CLINICAL DATA:  Mid abdominal pain, back pain  EXAM: ABDOMEN - 2 VIEW  COMPARISON:  DG ABD PORTABLE 1V dated 09/26/2013  FINDINGS: Multiple dilated loops small bowel appreciated throughout the abdomen. Air-fluid levels identified. There is no evidence of free air. A moderate amount of stools appreciated region of the ascending colon. There is a paucity of distal bowel gas. The NG tube is seen with tip curled in the region of the stomach. When compared to previous study the dilated loops of small bowel have decreased in size and distribution.  IMPRESSION: Findings consistent with an improving small bowel obstruction.  There is no evidence of free air.   Electronically Signed   By: Margaree Mackintosh M.D.   On: 09/27/2013 13:27   Ir Shuntogram/ Fistulagram Left Mod Sed  09/28/2013   CLINICAL DATA:  End stage renal disease, poor function of left arm dialysis fistula.  TECHNIQUE: DIALYSIS SHUNTOGRAM  The procedure, risks (including but not limited to bleeding, infection, organ damage ), benefits, and alternatives were explained to the patient. Questions regarding the procedure were encouraged and answered. The patient understands and consents to the procedure.  In preparation for possible declot procedure, the skin overlying the left forearm fistula was prepped with Betadine, draped in usual sterile fashion, infiltrated locally with 1% lidocaine.  Intravenous Fentanyl and Versed were administered as conscious sedation during continuous cardiorespiratory monitoring by the radiology RN, with a total moderate sedation time of eighty minutes.  The outflow vein from the fistula was accessed just central to the fistula with a 21 gauge micropuncture needle under real-time ultrasound guidance. Needle was exchanged over a 018 guide wire for a 4 French transitional dilator. This was used for outflow venography.  The outflow vein was then accessed more centrally retrograde with a 21 gauge micropuncture needle under real-time ultrasound guidance. The needle was exchanged over the 018 wire for the transitional dilator, which allow placement of a Bentson wire. Over this, a 5 [place] Kumpe catheter was advanced and used to negotiate the arterial anastomosis for additional fistulography. Attempts were made to catheterize the proximal radial artery using a variety of catheter & guidewire combinations but these were ultimately unsuccessful. Assessment of the arterial anatomy of the forearm was performed with a blood pressure cuff at the upper arm level.  Ultimately, No declot procedure was needed. The access sites were removed and hemostasis achieved  with manual compression.  No immediate complication.  COMPARISON:  None  FLUOROSCOPY TIME:  10 min 12 seconds  ACCESS: Proximal left radial artery occlusion. Not amenable to percutaneous intervention. Consider surgical consultation.  FINDINGS: The outflow cephalic vein from the fistula is widely patent. Central venous system through the SVC is patent. There is a competing outflow vein at the level of the midforearm. There is stenosis at the arterial anastomosis. The proximal radial artery is occluded, not opacified central to the level of the fistula. There is retrograde flow in the distal radial artery supplied by palmar arches, supplying low-grade flow across the fistula.  IMPRESSION: 1. Proximal left radial artery occlusion. Collateral flow via palmar arches reconstitutes the distal radial artery, with retrograde flow into the fistula, at an inadequate rate. This is not amenable to percutaneous intervention. After telephone consultation with Dr. Jonnie Finner, plan made to proceed with hemodialysis catheter placement.   Electronically Signed   By: Arne Cleveland M.D.   On: 09/28/2013 14:17    Impression: 1.  Bowel obstruction picture with possible focus of obstruction in the region of the cecum, raising the question of a neoplasm in that area.  2. Multiple medical problems as outlined above  Plan:  I agree with Dr. Grandville Silos that colonoscopic evaluation is appropriate in this patient, if we're able to perform it. Since we are unable to prep the patient from above, and since his end stage renal disease limits to some extent what we can do with enemas, it is unclear whether we will be able to reach the proximal colon because of possible residual stool. Nonetheless, I think it is worth a try. I would favor doing it under propofol sedation, so as to avoid opiate induced ileus. For the same reason, I would plan to use carbon dioxide insufflation during the procedure, rather than air insufflation, to minimize  problems with gas retention.  The nature, purpose, and risks of the procedure were reviewed with the patient and he is agreeable. The timing of the procedure will depend on when the patient will be getting his AV fistula placement, which could be potentially tomorrow. I will plan to speak with his vascular surgeon to help clarify the best time to do the colonoscopy.   LOS: 3 days   Nicholas [name]  09/28/2013, 7:24 PM

## 2013-09-28 NOTE — Progress Notes (Signed)
  [facility] Progress Note   Subjective: had first BM today. Also, went to IR for clotted AVF, fistulogram showed very poor arterial inflow and they felt the access was not salvageable.  A tunneled HD cath was placed. Mouth is dry and lips are dry.    Exam  Blood pressure 116/76, pulse 101, temperature 98.4 F (36.9 C), temperature source Oral, resp. rate 12, height 5\' 6"  (1.676 m), weight 103.556 kg (228 lb 4.8 oz), SpO2 96.00%. Alert, pale elderly male, no distress NG tube in place No jvd Clear lungs bilat RRR no MRG Abd soft, mildly distended, dec'd BS No LE edema L AVF patent  Dialysis: [facility] 4h   69kg  2K//2.25Bath   Heparin 5000   LUE AVF   Hectorol 2     Epo 5000     Venofer none Last labs: Hb 9.9 last pth 296   Assessment: 1 Nausea / vomiting / SBO vs ileus- BM today, NG in place; per Gen Surg 2 ESRD HD today 3 Cdif - on iv flagyl 4 L1 vertebral fx- recent, nonsurgical Rx 5 CAD / afib- no anticoag d/t hx GIB 6 Anemia Hb 10, on darbe 100/wk 7 2HPTH- vit D, 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 8 HTN/volume- low BP's chronic, midodrine pre HD; looks dry, not eating, GI losses > will start fluids, get new bed with scale that works   Plan- HD today, keep even    Kelly Splinter MD  pager [phone]    cell [phone]  09/28/2013, 2:31 PM     Recent Labs Lab 09/26/13 0647 09/27/13 0500 09/28/13 0738  NA 137 140 139  K 4.2 4.4 4.0  CL 94* 97 98  CO2 26 23 20   GLUCOSE 91 84 92  BUN 43* 58* 44*  CREATININE 5.34* 6.29* 5.07*  CALCIUM 7.7* 7.3* 7.3*  PHOS  --  5.4* 4.6    Recent Labs Lab 09/25/13 1621 09/27/13 0500 09/28/13 0738  AST 11  --   --   ALT 6  --   --   ALKPHOS 138*  --   --   BILITOT 0.5  --   --   PROT 5.7*  --   --   ALBUMIN 2.0* 1.7* 1.7*    Recent Labs Lab 09/25/13 1621 09/26/13 0647 09/27/13 0500  WBC 14.1* 16.3* 16.6*  NEUTROABS 12.1*  --   --   HGB 10.4* 10.2* 10.4*  HCT 33.8* 33.1* 32.7*  MCV 96.0 95.7 94.5  PLT 367 327 317    . calcitonin (salmon)  1 spray Alternating Nares Daily  . darbepoetin  60 mcg Intravenous Q Mon-HD  . doxercalciferol  2 mcg Intravenous Q M,W,F-HD  . heparin  5,000 Units Subcutaneous 3 times per day  . insulin aspart  0-9 Units Subcutaneous 6 times per day  . metronidazole  500 mg Intravenous Q8H  . midodrine  5 mg Oral Q M,W,F-HD   . sodium chloride 10 mL/hr (09/28/13 1305)   acetaminophen, acetaminophen, HYDROmorphone (DILAUDID) injection, promethazine

## 2013-09-28 NOTE — Progress Notes (Addendum)
[facility] PROGRESS NOTE  Nicholas [name] [id number] DOB: 09-03-1940 DOA: 09/25/2013 PCP: [facility]  Assessment/Plan:  Principal Problem:   Small bowel obstruction: abd less distended. Had 2 mucous very small volume, blood tinged bowel movements after enema. GI to attempt colonoscopy to rule out mass. Active Problems:   ESRD (end stage renal disease) on dialysis, having problems with access. Had a right IJ tunneled HD cath today. Vascular surgery to left AV fistula tomorrow   DM (diabetes mellitus)   CAD (coronary artery disease)   Chronic Systolic CHF, compensated   L1 vertebral fracture Abnormal CT showing infiltrate: no cough. Clinically, no pneumonia.    Hypokalemia: replete Recent C diff. IV flagyl. protonix stopped  Code Status:  full Family Communication:   Disposition Plan:  SNF  HPI/Subjective: 2 small stools after enema. No pain. No dyspnea. No cough.  Objective: Filed Vitals:   09/28/13 2021  BP: 122/67  Pulse: 99  Temp: 98.1 F (36.7 C)  Resp: 14    Intake/Output Summary (Last 24 hours) at 09/28/13 2046 Last data filed at 09/28/13 1800  Gross per 24 hour  Intake  828.5 ml  Output    405 ml  Net  423.5 ml   Filed Weights   09/28/13 1450 09/28/13 1753 09/28/13 2021  Weight: 103.1 kg (227 lb 4.7 oz) 102.7 kg (226 lb 6.6 oz) 103.239 kg (227 lb 9.6 oz)    Exam:   General:  Asleep, arousable. On dialysis  HEENT: NG in place.   Cardiovascular: RRR without MGR  Respiratory: CTA without WRR  Abdomen: bowel sounds present. Soft, less distended  Ext: no cce  Basic Metabolic Panel:  Recent Labs Lab 09/25/13 1621 09/26/13 0647 09/27/13 0500 09/28/13 0738  NA 138 137 140 139  K 3.6* 4.2 4.4 4.0  CL 94* 94* 97 98  CO2 30 26 23 20   GLUCOSE 74 91 84 92  BUN 36* 43* 58* 44*  CREATININE 4.86* 5.34* 6.29* 5.07*  CALCIUM 8.2* 7.7* 7.3* 7.3*  PHOS  --   --  5.4* 4.6   Liver Function Tests:  Recent Labs Lab 09/25/13 1621  09/27/13 0500 09/28/13 0738  AST 11  --   --   ALT 6  --   --   ALKPHOS 138*  --   --   BILITOT 0.5  --   --   PROT 5.7*  --   --   ALBUMIN 2.0* 1.7* 1.7*   No results found for this basename: LIPASE, AMYLASE,  in the last 168 hours No results found for this basename: AMMONIA,  in the last 168 hours CBC:  Recent Labs Lab 09/25/13 1621 09/26/13 0647 09/27/13 0500  WBC 14.1* 16.3* 16.6*  NEUTROABS 12.1*  --   --   HGB 10.4* 10.2* 10.4*  HCT 33.8* 33.1* 32.7*  MCV 96.0 95.7 94.5  PLT 367 327 317   Cardiac Enzymes: No results found for this basename: CKTOTAL, CKMB, CKMBINDEX, TROPONINI,  in the last 168 hours BNP (last 3 results)  Recent Labs  10/20/12 0320  PROBNP 38564.0*   CBG:  Recent Labs Lab 09/27/13 2000 09/28/13 0006 09/28/13 0404 09/28/13 1230 09/28/13 2011  GLUCAP 92 92 93 101* 80    Recent Results (from the past 240 hour(s))  MRSA PCR SCREENING     Status: None   Collection Time    09/25/13 11:46 PM      Result Value Ref Range Status   MRSA by PCR NEGATIVE  NEGATIVE Final   Comment:            The GeneXpert MRSA Assay (FDA     approved for NASAL specimens     only), is one component of a     comprehensive MRSA colonization     surveillance program. It is not     intended to diagnose MRSA     infection nor to guide or     monitor treatment for     MRSA infections.     Studies: Ir Fluoro Guide Cv Line Right  09/28/2013   CLINICAL DATA:  Non functional left arm AV dialysis fistula, not amenable to percutaneous intervention. Dialysis access needed.  EXAM: TUNNELED HEMODIALYSIS CATHETER PLACEMENT WITH ULTRASOUND AND FLUOROSCOPIC GUIDANCE  TECHNIQUE: The procedure, risks, benefits, and alternatives were explained to the patient. Questions regarding the procedure were encouraged and answered. The patient understands and consents to the procedure. As antibiotic prophylaxis, cefazolin 1 g was ordered pre-procedure and administered intravenously within  one hour of incision.Patency of the right IJ vein was confirmed with ultrasound with image documentation. An appropriate skin site was determined. Region was prepped using maximum barrier technique including cap and mask, sterile gown, sterile gloves, large sterile sheet, and Chlorhexidine as cutaneous antisepsis. The region was infiltrated locally with 1% lidocaine.  Intravenous Fentanyl and Versed were administered as conscious sedation during continuous cardiorespiratory monitoring by the radiology RN, with a total moderate sedation time of 80 minutes.  Under real-time ultrasound guidance, the right IJ vein was accessed with a 21 gauge micropuncture needle; the needle tip within the vein was confirmed with ultrasound image documentation. Needle exchanged over the 018 guidewire for transitional dilator, which allowed advancement of a Benson wire into the IVC. Over this, an MPA catheter was advanced. A Hemosplit 19 hemodialysis catheter was tunneled from the right anterior chest wall approach to the right IJ dermatotomy site. The MPA catheter was exchanged over an Amplatz wire for serial vascular dilators which allow placement of a peel-away sheath, through which the catheter was advanced under intermittent fluoroscopy, positioned with its tips in the proximal and midright atrium. Spot chest radiograph confirms good catheter position. No pneumothorax. Catheter was flushed and primed per protocol. Catheter secured externally with O Prolene sutures. The right IJ dermatotomy site was closed with 3-0 Monocryl subcutaneous suture and covered with Dermabond. No immediate complication.  COMPARISON:  None  FLUOROSCOPY TIME:  10 min 12 seconds  ACCESS: Remains approachable for percutaneous intervention as needed.  IMPRESSION: 1. Technically successful placement of tunneled right IJ hemodialysis catheter with ultrasound and fluoroscopic guidance. Ready for routine use.   Electronically Signed   By: Arne Cleveland M.D.   On:  09/28/2013 14:10   Ir Angio Av Shunt Addl Access  09/28/2013   CLINICAL DATA:  End stage renal disease, poor function of left arm dialysis fistula.  TECHNIQUE: DIALYSIS SHUNTOGRAM  The procedure, risks (including but not limited to bleeding, infection, organ damage ), benefits, and alternatives were explained to the patient. Questions regarding the procedure were encouraged and answered. The patient understands and consents to the procedure.  In preparation for possible declot procedure, the skin overlying the left forearm fistula was prepped with Betadine, draped in usual sterile fashion, infiltrated locally with 1% lidocaine.  Intravenous Fentanyl and Versed were administered as conscious sedation during continuous cardiorespiratory monitoring by the radiology RN, with a total moderate sedation time of eighty minutes.  The outflow vein from the fistula was accessed just  central to the fistula with a 21 gauge micropuncture needle under real-time ultrasound guidance. Needle was exchanged over a 018 guide wire for a 4 French transitional dilator. This was used for outflow venography.  The outflow vein was then accessed more centrally retrograde with a 21 gauge micropuncture needle under real-time ultrasound guidance. The needle was exchanged over the 018 wire for the transitional dilator, which allow placement of a Bentson wire. Over this, a 5 [place] Kumpe catheter was advanced and used to negotiate the arterial anastomosis for additional fistulography. Attempts were made to catheterize the proximal radial artery using a variety of catheter & guidewire combinations but these were ultimately unsuccessful. Assessment of the arterial anatomy of the forearm was performed with a blood pressure cuff at the upper arm level.  Ultimately, No declot procedure was needed. The access sites were removed and hemostasis achieved with manual compression.  No immediate complication.  COMPARISON:  None  FLUOROSCOPY TIME:  10 min 12  seconds  ACCESS: Proximal left radial artery occlusion. Not amenable to percutaneous intervention. Consider surgical consultation.  FINDINGS: The outflow cephalic vein from the fistula is widely patent. Central venous system through the SVC is patent. There is a competing outflow vein at the level of the midforearm. There is stenosis at the arterial anastomosis. The proximal radial artery is occluded, not opacified central to the level of the fistula. There is retrograde flow in the distal radial artery supplied by palmar arches, supplying low-grade flow across the fistula.  IMPRESSION: 1. Proximal left radial artery occlusion. Collateral flow via palmar arches reconstitutes the distal radial artery, with retrograde flow into the fistula, at an inadequate rate. This is not amenable to percutaneous intervention. After telephone consultation with Dr. Jonnie Finner, plan made to proceed with hemodialysis catheter placement.   Electronically Signed   By: Arne Cleveland M.D.   On: 09/28/2013 14:17   Ir US Guide Vasc Access Left  09/28/2013   CLINICAL DATA:  End stage renal disease, poor function of left arm dialysis fistula.  TECHNIQUE: DIALYSIS SHUNTOGRAM  The procedure, risks (including but not limited to bleeding, infection, organ damage ), benefits, and alternatives were explained to the patient. Questions regarding the procedure were encouraged and answered. The patient understands and consents to the procedure.  In preparation for possible declot procedure, the skin overlying the left forearm fistula was prepped with Betadine, draped in usual sterile fashion, infiltrated locally with 1% lidocaine.  Intravenous Fentanyl and Versed were administered as conscious sedation during continuous cardiorespiratory monitoring by the radiology RN, with a total moderate sedation time of eighty minutes.  The outflow vein from the fistula was accessed just central to the fistula with a 21 gauge micropuncture needle under real-time  ultrasound guidance. Needle was exchanged over a 018 guide wire for a 4 French transitional dilator. This was used for outflow venography.  The outflow vein was then accessed more centrally retrograde with a 21 gauge micropuncture needle under real-time ultrasound guidance. The needle was exchanged over the 018 wire for the transitional dilator, which allow placement of a Bentson wire. Over this, a 5 [place] Kumpe catheter was advanced and used to negotiate the arterial anastomosis for additional fistulography. Attempts were made to catheterize the proximal radial artery using a variety of catheter & guidewire combinations but these were ultimately unsuccessful. Assessment of the arterial anatomy of the forearm was performed with a blood pressure cuff at the upper arm level.  Ultimately, No declot procedure was needed. The access sites  were removed and hemostasis achieved with manual compression.  No immediate complication.  COMPARISON:  None  FLUOROSCOPY TIME:  10 min 12 seconds  ACCESS: Proximal left radial artery occlusion. Not amenable to percutaneous intervention. Consider surgical consultation.  FINDINGS: The outflow cephalic vein from the fistula is widely patent. Central venous system through the SVC is patent. There is a competing outflow vein at the level of the midforearm. There is stenosis at the arterial anastomosis. The proximal radial artery is occluded, not opacified central to the level of the fistula. There is retrograde flow in the distal radial artery supplied by palmar arches, supplying low-grade flow across the fistula.  IMPRESSION: 1. Proximal left radial artery occlusion. Collateral flow via palmar arches reconstitutes the distal radial artery, with retrograde flow into the fistula, at an inadequate rate. This is not amenable to percutaneous intervention. After telephone consultation with Dr. Arlean Hopping, plan made to proceed with hemodialysis catheter placement.   Electronically Signed   By:  Oley Balm M.D.   On: 09/28/2013 14:17   Ir US Guide Vasc Access Right  09/28/2013   CLINICAL DATA:  Non functional left arm AV dialysis fistula, not amenable to percutaneous intervention. Dialysis access needed.  EXAM: TUNNELED HEMODIALYSIS CATHETER PLACEMENT WITH ULTRASOUND AND FLUOROSCOPIC GUIDANCE  TECHNIQUE: The procedure, risks, benefits, and alternatives were explained to the patient. Questions regarding the procedure were encouraged and answered. The patient understands and consents to the procedure. As antibiotic prophylaxis, cefazolin 1 g was ordered pre-procedure and administered intravenously within one hour of incision.Patency of the right IJ vein was confirmed with ultrasound with image documentation. An appropriate skin site was determined. Region was prepped using maximum barrier technique including cap and mask, sterile gown, sterile gloves, large sterile sheet, and Chlorhexidine as cutaneous antisepsis. The region was infiltrated locally with 1% lidocaine.  Intravenous Fentanyl and Versed were administered as conscious sedation during continuous cardiorespiratory monitoring by the radiology RN, with a total moderate sedation time of 80 minutes.  Under real-time ultrasound guidance, the right IJ vein was accessed with a 21 gauge micropuncture needle; the needle tip within the vein was confirmed with ultrasound image documentation. Needle exchanged over the 018 guidewire for transitional dilator, which allowed advancement of a Benson wire into the IVC. Over this, an MPA catheter was advanced. A Hemosplit 19 hemodialysis catheter was tunneled from the right anterior chest wall approach to the right IJ dermatotomy site. The MPA catheter was exchanged over an Amplatz wire for serial vascular dilators which allow placement of a peel-away sheath, through which the catheter was advanced under intermittent fluoroscopy, positioned with its tips in the proximal and midright atrium. Spot chest  radiograph confirms good catheter position. No pneumothorax. Catheter was flushed and primed per protocol. Catheter secured externally with O Prolene sutures. The right IJ dermatotomy site was closed with 3-0 Monocryl subcutaneous suture and covered with Dermabond. No immediate complication.  COMPARISON:  None  FLUOROSCOPY TIME:  10 min 12 seconds  ACCESS: Remains approachable for percutaneous intervention as needed.  IMPRESSION: 1. Technically successful placement of tunneled right IJ hemodialysis catheter with ultrasound and fluoroscopic guidance. Ready for routine use.   Electronically Signed   By: Oley Balm M.D.   On: 09/28/2013 14:10   Dg Abd 2 Views  09/28/2013   CLINICAL DATA:  Small bowel obstruction. Abdominal distention. NG tube placement.  EXAM: ABDOMEN - 2 VIEW  COMPARISON:  09/27/2013.  FINDINGS: Supine and left lateral decubitus views of the abdomen again demonstrate  diffuse dilatation of the small bowel loops in the central abdomen measuring up to 4.3 cm in diameter. Multiple air-fluid levels are noted on the left lateral decubitus view. No frank pneumoperitoneum. Paucity of distal colonic gas, although there is a small amount of rectal gas present. Nasogastric tube appears coiled in the stomach. 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  IMPRESSION: 1. Bowel gas pattern remains compatible with a small bowel obstruction. 2. No pneumoperitoneum at this time. 3. Nasogastric tube remains coiled in the stomach.   Electronically Signed   By: Vinnie Langton M.D.   On: 09/28/2013 10:38   Dg Abd 2 Views  09/27/2013   CLINICAL DATA:  Mid abdominal pain, back pain  EXAM: ABDOMEN - 2 VIEW  COMPARISON:  DG ABD PORTABLE 1V dated 09/26/2013  FINDINGS: Multiple dilated loops small bowel appreciated throughout the abdomen. Air-fluid levels identified. There is no evidence of free air. A moderate amount of stools appreciated region of  the ascending colon. There is a paucity of distal bowel gas. The NG tube is seen with tip curled in the region of the stomach. When compared to previous study the dilated loops of small bowel have decreased in size and distribution.  IMPRESSION: Findings consistent with an improving small bowel obstruction. There is no evidence of free air.   Electronically Signed   By: Margaree Mackintosh M.D.   On: 09/27/2013 13:27   Ir Shuntogram/ Fistulagram Left Mod Sed  09/28/2013   CLINICAL DATA:  End stage renal disease, poor function of left arm dialysis fistula.  TECHNIQUE: DIALYSIS SHUNTOGRAM  The procedure, risks (including but not limited to bleeding, infection, organ damage ), benefits, and alternatives were explained to the patient. Questions regarding the procedure were encouraged and answered. The patient understands and consents to the procedure.  In preparation for possible declot procedure, the skin overlying the left forearm fistula was prepped with Betadine, draped in usual sterile fashion, infiltrated locally with 1% lidocaine.  Intravenous Fentanyl and Versed were administered as conscious sedation during continuous cardiorespiratory monitoring by the radiology RN, with a total moderate sedation time of eighty minutes.  The outflow vein from the fistula was accessed just central to the fistula with a 21 gauge micropuncture needle under real-time ultrasound guidance. Needle was exchanged over a 018 guide wire for a 4 French transitional dilator. This was used for outflow venography.  The outflow vein was then accessed more centrally retrograde with a 21 gauge micropuncture needle under real-time ultrasound guidance. The needle was exchanged over the 018 wire for the transitional dilator, which allow placement of a Bentson wire. Over this, a 5 [place] Kumpe catheter was advanced and used to negotiate the arterial anastomosis for additional fistulography. Attempts were made to catheterize the proximal radial artery  using a variety of catheter & guidewire combinations but these were ultimately unsuccessful. Assessment of the arterial anatomy of the forearm was performed with a blood pressure cuff at the upper arm level.  Ultimately, No declot procedure was needed. The access sites were removed and hemostasis achieved with manual compression.  No immediate complication.  COMPARISON:  None  FLUOROSCOPY TIME:  10 min 12 seconds  ACCESS: Proximal left radial artery occlusion. Not amenable to percutaneous intervention. Consider surgical consultation.  FINDINGS: The outflow cephalic vein from the fistula is widely patent. Central venous system through the SVC is patent. There is a competing outflow vein at the level of the midforearm. There  is stenosis at the arterial anastomosis. The proximal radial artery is occluded, not opacified central to the level of the fistula. There is retrograde flow in the distal radial artery supplied by palmar arches, supplying low-grade flow across the fistula.  IMPRESSION: 1. Proximal left radial artery occlusion. Collateral flow via palmar arches reconstitutes the distal radial artery, with retrograde flow into the fistula, at an inadequate rate. This is not amenable to percutaneous intervention. After telephone consultation with Dr. Jonnie Finner, plan made to proceed with hemodialysis catheter placement.   Electronically Signed   By: Arne Cleveland M.D.   On: 09/28/2013 14:17    Scheduled Meds: . calcitonin (salmon)  1 spray Alternating Nares Daily  . [START ON 09/29/2013] cefUROXime (ZINACEF)  IV  1.5 g Intravenous On Call to OR  . darbepoetin  60 mcg Intravenous Q Mon-HD  . doxercalciferol  2 mcg Intravenous Q M,W,F-HD  . heparin  5,000 Units Subcutaneous 3 times per day  . insulin aspart  0-9 Units Subcutaneous 6 times per day  . metronidazole  500 mg Intravenous Q8H  . midodrine  5 mg Oral Q M,W,F-HD   Continuous Infusions: . sodium chloride 10 mL/hr (09/28/13 1305)  . dextrose 5 % and  0.2 % NaCl 65 mL/hr (09/28/13 1835)    Time spent: 25 minutes  [facility] Pager [phone]. If 7PM-7AM, please contact night-coverage at [email or web address], password [email or web address] 09/28/2013, 8:46 PM  LOS: 3 days

## 2013-09-28 NOTE — Progress Notes (Signed)
PT Cancellation Note  Patient Details Name: Nicholas [name] MRN: [id number] DOB: September 28, 1940   Cancelled Treatment:    Reason Eval/Treat Not Completed: Patient at procedure or test/unavailable; transporter here to take pt to x ray.  Will attempt later today.   WYNN,CYNDI 09/28/2013, 9:35 AM

## 2013-09-28 NOTE — Consult Note (Signed)
[facility] NOTE   MRN : [id number]  Reason for Consult: Dialysis access Referring Physician: Sol Blazing, MD   History of Present Illness: 73 y/o male with ESRD he is currently on hemodialysis via temporary catheter.  We are ask to create permanent access.  Past medical history includes: DM, hypertension,CAD, and A=-fib not on coumadin rate controlled with amiodarone.  He takes Asprin 325 mg daily and insulin for his DM.  He is not on a Statin or beta blocker at this time.     Current Facility-Administered Medications  Medication Dose Route Frequency Provider Last Rate Last Dose  . 0.9 %  sodium chloride infusion   Intravenous Continuous Delfina Redwood, MD 10 mL/hr at 09/28/13 1305 10 mL/hr at 09/28/13 1305  . acetaminophen (TYLENOL) tablet 650 mg  650 mg Oral Q6H PRN Nishant Dhungel, MD       Or  . acetaminophen (TYLENOL) suppository 650 mg  650 mg Rectal Q6H PRN Nishant Dhungel, MD      . calcitonin (salmon) (MIACALCIN/FORTICAL) nasal spray 1 spray  1 spray Alternating Nares Daily Nishant Dhungel, MD   1 spray at 09/27/13 1144  . darbepoetin (ARANESP) injection 60 mcg  60 mcg Intravenous Q Mon-HD Delfina Redwood, MD   60 mcg at 09/27/13 0750  . dextrose 5 % and 0.2 % NaCl infusion   Intravenous Continuous Sol Blazing, MD      . doxercalciferol (HECTOROL) injection 2 mcg  2 mcg Intravenous Q M,W,F-HD Delfina Redwood, MD   2 mcg at 09/27/13 0750  . heparin injection 5,000 Units  5,000 Units Subcutaneous 3 times per day Louellen Molder, MD   5,000 Units at 09/28/13 1305  . HYDROmorphone (DILAUDID) injection 0.5 mg  0.5 mg Intravenous Q4H PRN Nishant Dhungel, MD   0.5 mg at 09/28/13 1036  . insulin aspart (novoLOG) injection 0-9 Units  0-9 Units Subcutaneous 6 times per day Nishant Dhungel, MD      . metroNIDAZOLE (FLAGYL) IVPB 500 mg  500 mg Intravenous Q8H Delfina Redwood, MD   500 mg at 09/28/13 0211  . midodrine (PROAMATINE)  tablet 5 mg  5 mg Oral Q M,W,F-HD Sol Blazing, MD      . promethazine (PHENERGAN) injection 12.5 mg  12.5 mg Intravenous Q6H PRN Nishant Dhungel, MD   12.5 mg at 09/27/13 0425    Pt meds include: Statin :No Betablocker: No ASA: Yes Other anticoagulants/antiplatelets:   Past Medical History  Diagnosis Date  . Coronary artery disease     a. hx of MI 1992; b. 10/2012 NSTEMI (no cath 2/2 GIB);  c. 11/2012 MV: Inf defect w inf and septal HK (favor prior MI), EF 47%;  d. 12/2012 Cath: Severe 3vd, EF 45-50%;  e. 01/2013 CABG x 5 (LIMA->LAD, VG->D1, VG->OM1->OM2, VG->PDA)  . Diabetes mellitus   . Hypertension   . Arthritis   . Carotid artery disease     a. 01/2013 doppler: >80% RICA stenosis, <25% LICA stenosis;  b. 11/2704 R CEA.  . Gastritis 2012 and 09/2011    treated for H Pylori in 12/2011  . Diabetic neuropathy   . Osteoporosis   . COPD (chronic obstructive pulmonary disease)   . Anxiety   . Depression   . ESRD (end stage renal disease)     M-W-Sa  [name]  . Diabetic nephropathy   . GERD (gastroesophageal reflux disease)   . Neuromuscular disorder  peripheral neurotherapy  . Anemia   . A-fib     a. Dx 10/2012, on amio, no anticoagulation 2/2 GIB 10/2012.  [name] GIB (gastrointestinal bleeding)     a. 10/2012, awaiting colonoscopy.    Past Surgical History  Procedure Laterality Date  . Coronary stent placement    . Hip surgery      both hips  . Av fistula placement      Left arm  . Cardiac catheterization    . Joint replacement Bilateral   . Eye surgery Bilateral     cararacts  . Coronary artery bypass graft N/A 02/03/2013    Procedure: CORONARY ARTERY BYPASS GRAFTING (CABG);  Surgeon: Melrose Nakayama, MD;  Location: [place];  Service: Open Heart Surgery;  Laterality: N/A;  . Endarterectomy N/A 02/03/2013    Procedure: ENDARTERECTOMY CAROTID;  Surgeon: Mal Misty, MD;  Location: [facility];  Service: Vascular;  Laterality: N/A;  . Olecranon bursectomy Right  07/28/2013    Procedure: RIGHT ELBOW OLECRANON BURSECTOMY ADVANCEMENT CLOSURE ;  Surgeon: Linna Hoff, MD;  Location: [facility];  Service: Orthopedics;  Laterality: Right;    Social History History  Substance Use Topics  . Smoking status: Former Smoker -- 1.00 packs/day for 25 years    Types: Cigarettes    Start date: 08/18/1965    Quit date: 08/18/1990  . Smokeless tobacco: Never Used  . Alcohol Use: Yes     Comment: occasionally    Family History Family History  Problem Relation Age of Onset  . Heart attack Father 58  . Heart disease Father   . Stroke Mother   . Hypertension Mother   . Other Mother     varicose veins  . Stroke Maternal Grandmother   . Hypertension Brother     Allergies  Allergen Reactions  . Statins Other (See Comments)    Muscle weakness      REVIEW OF SYSTEMS  General: [ ]  Weight loss, [ ]  Fever, [ ]  chills Neurologic: [ ]  Dizziness, [ ]  Blackouts, [ ]  Seizure [ ]  Stroke, [ ]  "Mini stroke", [ ]  Slurred speech, [ ]  Temporary blindness; [ ]  weakness in arms or legs, [ ]  Hoarseness [ ]  Dysphagia Cardiac: [ ]  Chest pain/pressure, [ ]  Shortness of breath at rest [ ]  Shortness of breath with exertion, [ ]  Atrial fibrillation or irregular heartbeat  Vascular: [ ]  Pain in legs with walking, [ ]  Pain in legs at rest, [ ]  Pain in legs at night,  [ ]  Non-healing ulcer, [ ]  Blood clot in vein/DVT,   Pulmonary: [ ]  Home oxygen, [ ]  Productive cough, [ ]  Coughing up blood, [ ]  Asthma,  [ ]  Wheezing [ ]  COPD Musculoskeletal:  [x ] Arthritis, [x ] Low back pain, [ ]  Joint pain Hematologic: [ ]  Easy Bruising, [x ] Anemia; [ ]  Hepatitis Gastrointestinal: [ ]  Blood in stool, [ ]  Gastroesophageal Reflux/heartburn, Urinary: [ ]  chronic Kidney disease, [x ] on HD - [x ] MWF or [ ]  TTHS, [ ]  Burning with urination, [ ]  Difficulty urinating Skin: [ ]  Rashes, [ ]  Wounds Psychological: [ ]  Anxiety, [ ]  Depression  Physical Examination Filed Vitals:   09/28/13 1500  09/28/13 1530 09/28/13 1600 09/28/13 1630  BP: 100/60 99/59 95/56  95/64  Pulse: 86 91 88 90  Temp:      TempSrc:      Resp:      Height:      Weight:  SpO2:       Body mass index is 36.7 kg/(m^2).  General:  WDWN in NAD HENT: WNL Eyes: Pupils equal Pulmonary: normal non-labored breathing , without Rales, rhonchi,  wheezing Cardiac: RRR No carotid bruits Abdomen: soft, NT, no masses Skin: no rashes, ulcers noted;  no Gangrene , no cellulitis; no open wounds;   Vascular Exam/Pulses:Palpable radial and brachial bilateral pulses.  He is right hand dominant.   Musculoskeletal: no muscle wasting or atrophy; no edema  Neurologic: A&O X 3; Appropriate Affect ;  SENSATION: normal; MOTOR FUNCTION: 5/5 Symmetric Speech is fluent/normal   Significant Diagnostic Studies: CBC Lab Results  Component Value Date   WBC 16.6* 09/27/2013   HGB 10.4* 09/27/2013   HCT 32.7* 09/27/2013   MCV 94.5 09/27/2013   PLT 317 09/27/2013    BMET    Component Value Date/Time   NA 139 09/28/2013 0738   K 4.0 09/28/2013 0738   CL 98 09/28/2013 0738   CO2 20 09/28/2013 0738   GLUCOSE 92 09/28/2013 0738   BUN 44* 09/28/2013 0738   CREATININE 5.07* 09/28/2013 0738   CALCIUM 7.3* 09/28/2013 0738   GFRNONAA 10* 09/28/2013 0738   GFRAA 12* 09/28/2013 0738   Estimated Creatinine Clearance: 15.2 ml/min (by C-G formula based on Cr of 5.07).  COAG Lab Results  Component Value Date   INR 1.25 02/16/2013   INR 1.48 02/03/2013   INR 1.04 02/01/2013     Non-Invasive Vascular Imaging: Vein mapping pending  ASSESSMENT/PLAN:  ESRD  Plan dialysis access tomorrow AV fistula creation left upper arm by Dr. Fae Pippin, EMMA [facility] 09/28/2013 4:58 PM  Agree with above assessment Dialysis catheter inserted by IR today following left forearm fistulogram which is not salvageable Patient appears to have satisfactory upper arm cephalic vein on the left Plan left brachial to cephalic A-V fistula per Dr.  Trula Slade tomorrow-will try to get vein mapping performed prior to surgery. Discussed with patient and he is agreeable

## 2013-09-28 NOTE — Procedures (Signed)
Shuntogram: occluded  Proximal radial artery, retrograde flow via palmar arches and distal radial art, min flow in patent outflow venous syst. R IJ tunneled HD catheter placed to SVC/RA junction No complication No blood loss. See complete dictation in [facility].

## 2013-09-28 NOTE — Progress Notes (Signed)
Pt suction tubing repeatedly clogging with large particles. Tubing changed and flushed x2 with 100 ml water to clear tubing. Pt also with 2 small liquid/mucous like bowel movements after fleet enema. Pt now cx of abd cramping. Pt to HD will continue to monitor.

## 2013-09-28 NOTE — ED Notes (Signed)
Change procedure to HD cath insertion

## 2013-09-29 ENCOUNTER — Encounter (HOSPITAL_COMMUNITY)
Admission: EM | Disposition: A | Discharge status: Skilled Nursing Facility | Payer: Self-pay | Source: Home / Self Care | Attending: Internal Medicine

## 2013-09-29 ENCOUNTER — Encounter (HOSPITAL_COMMUNITY): Payer: Medicare Other | Admitting: Anesthesiology

## 2013-09-29 ENCOUNTER — Inpatient Hospital Stay (HOSPITAL_COMMUNITY): Payer: Medicare Other | Admitting: Anesthesiology

## 2013-09-29 ENCOUNTER — Encounter (HOSPITAL_COMMUNITY): Payer: Self-pay | Admitting: Gastroenterology

## 2013-09-29 ENCOUNTER — Inpatient Hospital Stay (HOSPITAL_COMMUNITY): Payer: Medicare Other

## 2013-09-29 DIAGNOSIS — R6520 Severe sepsis without septic shock: Secondary | ICD-10-CM

## 2013-09-29 DIAGNOSIS — C182 Malignant neoplasm of ascending colon: Secondary | ICD-10-CM

## 2013-09-29 DIAGNOSIS — R6521 Severe sepsis with septic shock: Secondary | ICD-10-CM

## 2013-09-29 DIAGNOSIS — I2589 Other forms of chronic ischemic heart disease: Secondary | ICD-10-CM

## 2013-09-29 DIAGNOSIS — A419 Sepsis, unspecified organism: Secondary | ICD-10-CM | POA: Diagnosis present

## 2013-09-29 DIAGNOSIS — E1129 Type 2 diabetes mellitus with other diabetic kidney complication: Secondary | ICD-10-CM

## 2013-09-29 DIAGNOSIS — K659 Peritonitis, unspecified: Secondary | ICD-10-CM | POA: Diagnosis present

## 2013-09-29 DIAGNOSIS — Z01810 Encounter for preprocedural cardiovascular examination: Secondary | ICD-10-CM

## 2013-09-29 DIAGNOSIS — E43 Unspecified severe protein-calorie malnutrition: Secondary | ICD-10-CM

## 2013-09-29 HISTORY — PX: COLOSTOMY: SHX63

## 2013-09-29 HISTORY — PX: PARTIAL COLECTOMY: SHX5273

## 2013-09-29 HISTORY — PX: COLONOSCOPY: SHX5424

## 2013-09-29 LAB — POCT I-STAT 3, ART BLOOD GAS (G3+)
Acid-base deficit: 4 mmol/L — ABNORMAL HIGH (ref 0.0–2.0)
Bicarbonate: 19.7 mEq/L — ABNORMAL LOW (ref 20.0–24.0)
O2 Saturation: 99 %
PH ART: 7.43 (ref 7.350–7.450)
TCO2: 21 mmol/L (ref 0–100)
pCO2 arterial: 29.6 mmHg — ABNORMAL LOW (ref 35.0–45.0)
pO2, Arterial: 151 mmHg — ABNORMAL HIGH (ref 80.0–100.0)

## 2013-09-29 LAB — BASIC METABOLIC PANEL
BUN: 21 mg/dL (ref 6–23)
BUN: 25 mg/dL — ABNORMAL HIGH (ref 6–23)
CHLORIDE: 97 meq/L (ref 96–112)
CO2: 23 mEq/L (ref 19–32)
CO2: 18 mEq/L — ABNORMAL LOW (ref 19–32)
CREATININE: 3.01 mg/dL — AB (ref 0.50–1.35)
CREATININE: 3.27 mg/dL — AB (ref 0.50–1.35)
Calcium: 7.3 mg/dL — ABNORMAL LOW (ref 8.4–10.5)
Calcium: 7.1 mg/dL — ABNORMAL LOW (ref 8.4–10.5)
Chloride: 97 mEq/L (ref 96–112)
GFR calc Af Amer: 23 mL/min — ABNORMAL LOW (ref >90)
GFR calc non Af Amer: 20 mL/min — ABNORMAL LOW (ref >90)
GFR calc non Af Amer: 18 mL/min — ABNORMAL LOW (ref >90)
GFR, EST AFRICAN AMERICAN: 21 mL/min — AB (ref >90)
Glucose, Bld: 122 mg/dL — ABNORMAL HIGH (ref 70–99)
Glucose, Bld: 193 mg/dL — ABNORMAL HIGH (ref 70–99)
Potassium: 3.2 mEq/L — ABNORMAL LOW (ref 3.7–5.3)
Potassium: 3.2 mEq/L — ABNORMAL LOW (ref 3.7–5.3)
Sodium: 139 mEq/L (ref 137–147)
Sodium: 134 mEq/L — ABNORMAL LOW (ref 137–147)

## 2013-09-29 LAB — CBC
HCT: 35.7 % — ABNORMAL LOW (ref 39.0–52.0)
HEMATOCRIT: 31.8 % — AB (ref 39.0–52.0)
Hemoglobin: 9.9 g/dL — ABNORMAL LOW (ref 13.0–17.0)
Hemoglobin: 11.4 g/dL — ABNORMAL LOW (ref 13.0–17.0)
MCH: 29.3 pg (ref 26.0–34.0)
MCH: 30.1 pg (ref 26.0–34.0)
MCHC: 31.1 g/dL (ref 30.0–36.0)
MCHC: 31.9 g/dL (ref 30.0–36.0)
MCV: 94.1 fL (ref 78.0–100.0)
MCV: 94.2 fL (ref 78.0–100.0)
PLATELETS: 359 10*3/uL (ref 150–400)
Platelets: 325 10*3/uL (ref 150–400)
RBC: 3.38 MIL/uL — ABNORMAL LOW (ref 4.22–5.81)
RBC: 3.79 MIL/uL — ABNORMAL LOW (ref 4.22–5.81)
RDW: 19.5 % — AB (ref 11.5–15.5)
RDW: 19.4 % — AB (ref 11.5–15.5)
WBC: 14.9 10*3/uL — ABNORMAL HIGH (ref 4.0–10.5)
WBC: 31.6 10*3/uL — ABNORMAL HIGH (ref 4.0–10.5)

## 2013-09-29 LAB — GLUCOSE, CAPILLARY
GLUCOSE-CAPILLARY: 126 mg/dL — AB (ref 70–99)
GLUCOSE-CAPILLARY: 166 mg/dL — AB (ref 70–99)
GLUCOSE-CAPILLARY: 83 mg/dL (ref 70–99)
Glucose-Capillary: 100 mg/dL — ABNORMAL HIGH (ref 70–99)
Glucose-Capillary: 117 mg/dL — ABNORMAL HIGH (ref 70–99)
Glucose-Capillary: 128 mg/dL — ABNORMAL HIGH (ref 70–99)
Glucose-Capillary: 129 mg/dL — ABNORMAL HIGH (ref 70–99)

## 2013-09-29 LAB — PROTIME-INR
INR: 1.62 — ABNORMAL HIGH (ref 0.00–1.49)
PROTHROMBIN TIME: 18.8 s — AB (ref 11.6–15.2)

## 2013-09-29 LAB — LACTIC ACID, PLASMA: LACTIC ACID, VENOUS: 1.5 mmol/L (ref 0.5–2.2)

## 2013-09-29 SURGERY — ARTERIOVENOUS (AV) FISTULA CREATION
Anesthesia: Monitor Anesthesia Care | Laterality: Left

## 2013-09-29 SURGERY — COLECTOMY, PARTIAL
Anesthesia: General | Laterality: Right

## 2013-09-29 SURGERY — COLONOSCOPY
Anesthesia: Monitor Anesthesia Care

## 2013-09-29 MED ORDER — CHLORHEXIDINE GLUCONATE 0.12 % MT SOLN
15.0000 mL | Freq: Two times a day (BID) | OROMUCOSAL | Status: DC
  Administered 2013-09-29 – 2013-10-12 (×21): 15 mL via OROMUCOSAL
  Filled 2013-09-29 (×28): qty 15

## 2013-09-29 MED ORDER — ESMOLOL HCL 10 MG/ML IV SOLN
INTRAVENOUS | Status: AC
  Filled 2013-09-29: qty 10

## 2013-09-29 MED ORDER — HEPARIN SODIUM (PORCINE) 1000 UNIT/ML DIALYSIS: 2000.0000 [IU] | INTRAMUSCULAR | Status: DC | PRN

## 2013-09-29 MED ORDER — PROPOFOL INFUSION 10 MG/ML OPTIME
INTRAVENOUS | Status: DC | PRN
  Administered 2013-09-29: 100 ug/kg/min via INTRAVENOUS

## 2013-09-29 MED ORDER — GLYCOPYRROLATE 0.2 MG/ML IJ SOLN
INTRAMUSCULAR | Status: DC | PRN
  Administered 2013-09-29: 0.6 mg via INTRAVENOUS

## 2013-09-29 MED ORDER — PROPOFOL 10 MG/ML IV BOLUS
INTRAVENOUS | Status: DC | PRN
  Administered 2013-09-29 (×2): 40 mg via INTRAVENOUS

## 2013-09-29 MED ORDER — PHENYLEPHRINE 40 MCG/ML (10ML) SYRINGE FOR IV PUSH (FOR BLOOD PRESSURE SUPPORT)
PREFILLED_SYRINGE | INTRAVENOUS | Status: AC
  Filled 2013-09-29: qty 10

## 2013-09-29 MED ORDER — HEPARIN SODIUM (PORCINE) 1000 UNIT/ML DIALYSIS: 1000.0000 [IU] | INTRAMUSCULAR | Status: DC | PRN

## 2013-09-29 MED ORDER — ESMOLOL HCL 10 MG/ML IV SOLN
INTRAVENOUS | Status: DC | PRN
  Administered 2013-09-29: 10 mg via INTRAVENOUS

## 2013-09-29 MED ORDER — SODIUM CHLORIDE 0.9 % IJ SOLN: 9.0000 mL | INTRAMUSCULAR | Status: DC | PRN

## 2013-09-29 MED ORDER — FENTANYL CITRATE 0.05 MG/ML IJ SOLN
INTRAMUSCULAR | Status: AC
  Filled 2013-09-29: qty 5

## 2013-09-29 MED ORDER — ONDANSETRON HCL 4 MG/2ML IJ SOLN
INTRAMUSCULAR | Status: DC | PRN
  Administered 2013-09-29: 4 mg via INTRAVENOUS

## 2013-09-29 MED ORDER — PHENYLEPHRINE HCL 10 MG/ML IJ SOLN
INTRAMUSCULAR | Status: DC | PRN
  Administered 2013-09-29: 40 ug via INTRAVENOUS
  Administered 2013-09-29: 80 ug via INTRAVENOUS
  Administered 2013-09-29: 120 ug via INTRAVENOUS
  Administered 2013-09-29 (×2): 40 ug via INTRAVENOUS
  Administered 2013-09-29: 80 ug via INTRAVENOUS
  Administered 2013-09-29: 40 ug via INTRAVENOUS
  Administered 2013-09-29 (×2): 80 ug via INTRAVENOUS
  Administered 2013-09-29: 120 ug via INTRAVENOUS

## 2013-09-29 MED ORDER — NEPRO/CARBSTEADY PO LIQD
237.0000 mL | ORAL | Status: DC | PRN
  Filled 2013-09-29: qty 237

## 2013-09-29 MED ORDER — MORPHINE SULFATE (PF) 1 MG/ML IV SOLN: INTRAVENOUS | Status: DC

## 2013-09-29 MED ORDER — BIOTENE DRY MOUTH MT LIQD
1.0000 "application " | Freq: Four times a day (QID) | OROMUCOSAL | Status: DC
  Administered 2013-09-30 – 2013-10-12 (×33): 15 mL via OROMUCOSAL

## 2013-09-29 MED ORDER — SODIUM CHLORIDE 0.9 % IV SOLN: 100.0000 mL | INTRAVENOUS | Status: DC | PRN

## 2013-09-29 MED ORDER — DEXMEDETOMIDINE HCL IN NACL 200 MCG/50ML IV SOLN
INTRAVENOUS | Status: DC | PRN
  Administered 2013-09-29: 0.5 ug/kg/h via INTRAVENOUS

## 2013-09-29 MED ORDER — POTASSIUM CHLORIDE 10 MEQ/100ML IV SOLN
10.0000 meq | Freq: Once | INTRAVENOUS | Status: DC
  Filled 2013-09-29: qty 100

## 2013-09-29 MED ORDER — SODIUM CHLORIDE 0.9 % IV SOLN
10.0000 mg | INTRAVENOUS | Status: DC | PRN
  Administered 2013-09-29: 60 ug/min via INTRAVENOUS

## 2013-09-29 MED ORDER — LIDOCAINE HCL (PF) 1 % IJ SOLN: 5.0000 mL | INTRAMUSCULAR | Status: DC | PRN

## 2013-09-29 MED ORDER — PANTOPRAZOLE SODIUM 40 MG IV SOLR
40.0000 mg | INTRAVENOUS | Status: DC
  Administered 2013-09-29 – 2013-10-02 (×4): 40 mg via INTRAVENOUS
  Filled 2013-09-29 (×5): qty 40

## 2013-09-29 MED ORDER — ARTIFICIAL TEARS OP OINT
TOPICAL_OINTMENT | OPHTHALMIC | Status: AC
  Filled 2013-09-29: qty 3.5

## 2013-09-29 MED ORDER — DIPHENHYDRAMINE HCL 12.5 MG/5ML PO ELIX
12.5000 mg | ORAL_SOLUTION | Freq: Four times a day (QID) | ORAL | Status: DC | PRN
  Filled 2013-09-29: qty 5

## 2013-09-29 MED ORDER — LIDOCAINE-PRILOCAINE 2.5-2.5 % EX CREA: 1.0000 "application " | TOPICAL_CREAM | CUTANEOUS | Status: DC | PRN

## 2013-09-29 MED ORDER — NALOXONE HCL 0.4 MG/ML IJ SOLN
0.4000 mg | INTRAMUSCULAR | Status: DC | PRN
Start: 2013-09-29 — End: 2013-09-29

## 2013-09-29 MED ORDER — DEXMEDETOMIDINE HCL IN NACL 200 MCG/50ML IV SOLN: 0.0000 ug/kg/h | INTRAVENOUS | Status: DC

## 2013-09-29 MED ORDER — FENTANYL CITRATE 0.05 MG/ML IJ SOLN
INTRAMUSCULAR | Status: DC | PRN
  Administered 2013-09-29: 50 ug via INTRAVENOUS
  Administered 2013-09-29: 100 ug via INTRAVENOUS
  Administered 2013-09-29 (×4): 50 ug via INTRAVENOUS
  Administered 2013-09-29: 100 ug via INTRAVENOUS

## 2013-09-29 MED ORDER — CHLORHEXIDINE GLUCONATE 0.12 % MT SOLN: 15.0000 mL | Freq: Two times a day (BID) | OROMUCOSAL | Status: DC

## 2013-09-29 MED ORDER — PHENYLEPHRINE HCL 10 MG/ML IJ SOLN
30.0000 ug/min | INTRAVENOUS | Status: DC
  Administered 2013-09-30: 130 ug/min via INTRAVENOUS
  Administered 2013-09-30: 45 ug/min via INTRAVENOUS
  Filled 2013-09-29 (×4): qty 4

## 2013-09-29 MED ORDER — SUCCINYLCHOLINE CHLORIDE 20 MG/ML IJ SOLN
INTRAMUSCULAR | Status: DC | PRN
Start: 2013-09-29 — End: 2013-09-29
  Administered 2013-09-29: 60 mg via INTRAVENOUS

## 2013-09-29 MED ORDER — SODIUM CHLORIDE 0.9 % IV SOLN: INTRAVENOUS | Status: DC

## 2013-09-29 MED ORDER — NEOSTIGMINE METHYLSULFATE 1 MG/ML IJ SOLN
INTRAMUSCULAR | Status: DC | PRN
  Administered 2013-09-29: 5 mg via INTRAVENOUS

## 2013-09-29 MED ORDER — SODIUM CHLORIDE 0.9 % IV SOLN
INTRAVENOUS | Status: DC | PRN
  Administered 2013-09-29: 10:00:00 via INTRAVENOUS

## 2013-09-29 MED ORDER — FENTANYL CITRATE 0.05 MG/ML IJ SOLN: 50.0000 ug | INTRAMUSCULAR | Status: DC | PRN

## 2013-09-29 MED ORDER — ALTEPLASE 2 MG IJ SOLR
2.0000 mg | Freq: Once | INTRAMUSCULAR | Status: DC | PRN
  Filled 2013-09-29: qty 2

## 2013-09-29 MED ORDER — 0.9 % SODIUM CHLORIDE (POUR BTL) OPTIME
TOPICAL | Status: DC | PRN
  Administered 2013-09-29 (×2): 2000 mL

## 2013-09-29 MED ORDER — FENTANYL CITRATE 0.05 MG/ML IJ SOLN: 25.0000 ug | INTRAMUSCULAR | Status: DC | PRN

## 2013-09-29 MED ORDER — PHENYLEPHRINE HCL 10 MG/ML IJ SOLN
30.0000 ug/min | INTRAMUSCULAR | Status: DC
  Administered 2013-09-29: 120 ug/min via INTRAVENOUS
  Filled 2013-09-29: qty 4

## 2013-09-29 MED ORDER — FENTANYL CITRATE 0.05 MG/ML IJ SOLN
INTRAMUSCULAR | Status: AC
  Filled 2013-09-29: qty 2

## 2013-09-29 MED ORDER — DEXMEDETOMIDINE HCL IN NACL 200 MCG/50ML IV SOLN
INTRAVENOUS | Status: AC
  Filled 2013-09-29: qty 50

## 2013-09-29 MED ORDER — GLYCOPYRROLATE 0.2 MG/ML IJ SOLN
INTRAMUSCULAR | Status: AC
  Filled 2013-09-29: qty 3

## 2013-09-29 MED ORDER — NEOSTIGMINE METHYLSULFATE 1 MG/ML IJ SOLN
INTRAMUSCULAR | Status: AC
  Filled 2013-09-29: qty 10

## 2013-09-29 MED ORDER — VECURONIUM BROMIDE 10 MG IV SOLR
INTRAVENOUS | Status: DC | PRN
  Administered 2013-09-29: 3 mg via INTRAVENOUS

## 2013-09-29 MED ORDER — PENTAFLUOROPROP-TETRAFLUOROETH EX AERO: 1.0000 "application " | INHALATION_SPRAY | CUTANEOUS | Status: DC | PRN

## 2013-09-29 MED ORDER — FENTANYL CITRATE 0.05 MG/ML IJ SOLN
INTRAMUSCULAR | Status: DC | PRN
  Administered 2013-09-29 (×4): 25 ug via INTRAVENOUS

## 2013-09-29 MED ORDER — SODIUM CHLORIDE 0.9 % IV SOLN
500.0000 mg | INTRAVENOUS | Status: DC
  Administered 2013-09-30 – 2013-10-06 (×6): 0.5 g via INTRAVENOUS
  Filled 2013-09-29 (×8): qty 0.5

## 2013-09-29 MED ORDER — MIDAZOLAM HCL 2 MG/2ML IJ SOLN
INTRAMUSCULAR | Status: AC
  Filled 2013-09-29: qty 2

## 2013-09-29 MED ORDER — ONDANSETRON HCL 4 MG/2ML IJ SOLN: 4.0000 mg | Freq: Four times a day (QID) | INTRAMUSCULAR | Status: DC | PRN

## 2013-09-29 MED ORDER — MIDAZOLAM HCL 5 MG/5ML IJ SOLN
INTRAMUSCULAR | Status: DC | PRN
  Administered 2013-09-29 (×2): 1 mg via INTRAVENOUS

## 2013-09-29 MED ORDER — PROPOFOL 10 MG/ML IV BOLUS
INTRAVENOUS | Status: AC
  Filled 2013-09-29: qty 20

## 2013-09-29 MED ORDER — FENTANYL CITRATE 0.05 MG/ML IJ SOLN
25.0000 ug | Freq: Once | INTRAMUSCULAR | Status: AC
  Administered 2013-09-29: 25 ug via INTRAVENOUS

## 2013-09-29 MED ORDER — ONDANSETRON HCL 4 MG/2ML IJ SOLN
INTRAMUSCULAR | Status: AC
  Filled 2013-09-29: qty 2

## 2013-09-29 MED ORDER — FENTANYL CITRATE 0.05 MG/ML IJ SOLN
25.0000 ug/h | INTRAMUSCULAR | Status: DC
  Administered 2013-09-29: 25 ug/h via INTRAVENOUS
  Filled 2013-09-29: qty 50

## 2013-09-29 MED ORDER — SODIUM CHLORIDE 0.9 % IV SOLN
1.0000 g | Freq: Once | INTRAVENOUS | Status: AC
  Administered 2013-09-29: 1 g via INTRAVENOUS
  Filled 2013-09-29: qty 1

## 2013-09-29 MED ORDER — DIPHENHYDRAMINE HCL 50 MG/ML IJ SOLN
12.5000 mg | Freq: Four times a day (QID) | INTRAMUSCULAR | Status: DC | PRN
Start: 2013-09-29 — End: 2013-09-29

## 2013-09-29 MED ORDER — PHENYLEPHRINE HCL 10 MG/ML IJ SOLN
30.0000 ug/min | INTRAMUSCULAR | Status: DC
  Administered 2013-09-29: 100 ug/min via INTRAVENOUS
  Filled 2013-09-29 (×2): qty 1

## 2013-09-29 MED ORDER — PHENYLEPHRINE HCL 10 MG/ML IJ SOLN
INTRAMUSCULAR | Status: DC | PRN
  Administered 2013-09-29: 80 ug via INTRAVENOUS
  Administered 2013-09-29: 120 ug via INTRAVENOUS

## 2013-09-29 SURGICAL SUPPLY — 59 items
BLADE SURG ROTATE 9660 (MISCELLANEOUS) IMPLANT
CANISTER SUCTION 2500CC (MISCELLANEOUS) ×4 IMPLANT
CHLORAPREP W/TINT 26ML (MISCELLANEOUS) ×4 IMPLANT
COVER MAYO STAND STRL (DRAPES) ×8 IMPLANT
COVER SURGICAL LIGHT HANDLE (MISCELLANEOUS) ×4 IMPLANT
DRAPE LAPAROSCOPIC ABDOMINAL (DRAPES) ×4 IMPLANT
DRAPE PROXIMA HALF (DRAPES) ×8 IMPLANT
DRAPE UTILITY 15X26 W/TAPE STR (DRAPE) ×20 IMPLANT
DRAPE WARM FLUID 44X44 (DRAPE) ×4 IMPLANT
DRSG OPSITE POSTOP 4X10 (GAUZE/BANDAGES/DRESSINGS) IMPLANT
DRSG OPSITE POSTOP 4X8 (GAUZE/BANDAGES/DRESSINGS) IMPLANT
ELECT BLADE 6.5 EXT (BLADE) ×4 IMPLANT
ELECT CAUTERY BLADE 6.4 (BLADE) ×8 IMPLANT
ELECT REM PT RETURN 9FT ADLT (ELECTROSURGICAL) ×4
ELECTRODE REM PT RTRN 9FT ADLT (ELECTROSURGICAL) ×2 IMPLANT
GLOVE BIO SURGEON STRL SZ7.5 (GLOVE) ×4 IMPLANT
GLOVE BIO SURGEON STRL SZ8 (GLOVE) ×8 IMPLANT
GLOVE BIOGEL PI IND STRL 7.0 (GLOVE) ×4 IMPLANT
GLOVE BIOGEL PI IND STRL 7.5 (GLOVE) ×2 IMPLANT
GLOVE BIOGEL PI IND STRL 8 (GLOVE) ×4 IMPLANT
GLOVE BIOGEL PI INDICATOR 7.0 (GLOVE) ×4
GLOVE BIOGEL PI INDICATOR 7.5 (GLOVE) ×2
GLOVE BIOGEL PI INDICATOR 8 (GLOVE) ×4
GLOVE SURG SS PI 7.0 STRL IVOR (GLOVE) ×8 IMPLANT
GOWN STRL NON-REIN LRG LVL3 (GOWN DISPOSABLE) ×16 IMPLANT
GOWN STRL REIN XL XLG (GOWN DISPOSABLE) ×8 IMPLANT
KIT BASIN OR (CUSTOM PROCEDURE TRAY) ×4 IMPLANT
KIT COLOSTOMY ILEOSTOMY 4 (WOUND CARE) ×4 IMPLANT
KIT ROOM TURNOVER OR (KITS) ×4 IMPLANT
LEGGING LITHOTOMY PAIR STRL (DRAPES) ×4 IMPLANT
LIGASURE IMPACT 36 18CM CVD LR (INSTRUMENTS) ×4 IMPLANT
NS IRRIG 1000ML POUR BTL (IV SOLUTION) ×8 IMPLANT
PACK GENERAL/GYN (CUSTOM PROCEDURE TRAY) ×4 IMPLANT
PAD ARMBOARD 7.5X6 YLW CONV (MISCELLANEOUS) ×8 IMPLANT
PENCIL BUTTON HOLSTER BLD 10FT (ELECTRODE) ×4 IMPLANT
RELOAD PROXIMATE 75MM BLUE (ENDOMECHANICALS) ×4 IMPLANT
SPECIMEN JAR X LARGE (MISCELLANEOUS) ×4 IMPLANT
SPONGE LAP 18X18 X RAY DECT (DISPOSABLE) ×12 IMPLANT
STAPLER PROXIMATE 75MM BLUE (STAPLE) ×4 IMPLANT
STAPLER VISISTAT 35W (STAPLE) ×4 IMPLANT
SUCTION POOLE TIP (SUCTIONS) ×4 IMPLANT
SURGILUBE 2OZ TUBE FLIPTOP (MISCELLANEOUS) IMPLANT
SUT PDS AB 1 TP1 96 (SUTURE) ×8 IMPLANT
SUT PROLENE 2 0 CT2 30 (SUTURE) IMPLANT
SUT PROLENE 2 0 KS (SUTURE) IMPLANT
SUT SILK 2 0 SH CR/8 (SUTURE) ×4 IMPLANT
SUT SILK 2 0 TIES 10X30 (SUTURE) ×4 IMPLANT
SUT SILK 3 0 SH CR/8 (SUTURE) ×4 IMPLANT
SUT SILK 3 0 TIES 10X30 (SUTURE) ×4 IMPLANT
SUT VIC AB 3-0 SH 18 (SUTURE) ×4 IMPLANT
SYR BULB IRRIGATION 50ML (SYRINGE) ×4 IMPLANT
TOWEL OR 17X26 10 PK STRL BLUE (TOWEL DISPOSABLE) ×8 IMPLANT
TRAY FOLEY CATH 14FRSI W/METER (CATHETERS) ×4 IMPLANT
TRAY PROCTOSCOPIC FIBER OPTIC (SET/KITS/TRAYS/PACK) IMPLANT
TUBE CONNECTING 12'X1/4 (SUCTIONS) ×1
TUBE CONNECTING 12X1/4 (SUCTIONS) ×3 IMPLANT
UNDERPAD 30X30 INCONTINENT (UNDERPADS AND DIAPERS) IMPLANT
WATER STERILE IRR 1000ML POUR (IV SOLUTION) ×4 IMPLANT
YANKAUER SUCT BULB TIP NO VENT (SUCTIONS) ×8 IMPLANT

## 2013-09-29 NOTE — Progress Notes (Signed)
Fentanyl 25 mg iv given for pain

## 2013-09-29 NOTE — Progress Notes (Signed)
[facility] Progress Note   Subjective: had colonscopy showing proximal colon tumor/cancer, biopsies taken. Post procedure had abd pain and free air, went to OR for perf R colon and had R colectomy with ileostomy.  In ICU now on vent, responsive, BP 90's, on neo gtt.    Exam  Blood pressure 94/53, pulse 73, temperature 98.2 F (36.8 C), temperature source Oral, resp. rate 18, height 5\' 6"  (1.676 m), weight 103.239 kg (227 lb 9.6 oz), SpO2 100.00%. On vent, drowsy but responsive ETT in place Clear lungs bilat RRR no MRG Abd firm, dec'd BS, ileostomy in place No LE edema R IJ cath in place, L AVF no bruit  Dialysis: [facility]WF [facility] 4h   69kg  2K//2.25Bath   Heparin 5000   LUE AVF   Hectorol 2     Epo 5000     Venofer none Last labs: Hb 9.9 last pth 296   Assessment: 1 Bowel obstruction due to colon tumor 2 Perf colon s/p R colectomy / ileostomy 3 ESRD 4 CDif on IV flagyl 5 CAD / afib- no AC due to hx GIB 6 Anemia Hb 9.9, darbe 100/wk 7 Recent L1 fx, nonsurgical Rx 8 HTN/volume- low BP's chronic, no vol excess, 3kg below dry wt  Plan- will plan next HD tomorrow or on Sat, will decide in am    Kelly Splinter MD  pager [phone]    cell [phone]  09/29/2013, 5:40 PM     Recent Labs Lab 09/27/13 0500 09/28/13 0738 09/29/13 0508  NA 140 139 139  K 4.4 4.0 3.2*  CL 97 98 97  CO2 23 20 23   GLUCOSE 84 92 122*  BUN 58* 44* 21  CREATININE 6.29* 5.07* 3.01*  CALCIUM 7.3* 7.3* 7.3*  PHOS 5.4* 4.6  --     Recent Labs Lab 09/25/13 1621 09/27/13 0500 09/28/13 0738  AST 11  --   --   ALT 6  --   --   ALKPHOS 138*  --   --   BILITOT 0.5  --   --   PROT 5.7*  --   --   ALBUMIN 2.0* 1.7* 1.7*    Recent Labs Lab 09/25/13 1621 09/26/13 0647 09/27/13 0500 09/29/13 0508  WBC 14.1* 16.3* 16.6* 14.9*  NEUTROABS 12.1*  --   --   --   HGB 10.4* 10.2* 10.4* 9.9*  HCT 33.8* 33.1* 32.7* 31.8*  MCV 96.0 95.7 94.5 94.1  PLT 367 327 317 325   . [START ON  09/30/2013] antiseptic oral rinse  1 application Mouth Rinse QID  . calcitonin (salmon)  1 spray Alternating Nares Daily  . cefUROXime (ZINACEF)  IV  1.5 g Intravenous On Call to OR  . chlorhexidine  15 mL Mouth/Throat BID  . darbepoetin  60 mcg Intravenous Q Mon-HD  . doxercalciferol  2 mcg Intravenous Q M,W,F-HD  . heparin  5,000 Units Subcutaneous 3 times per day  . insulin aspart  0-9 Units Subcutaneous 6 times per day  . metronidazole  500 mg Intravenous Q8H  . midodrine  5 mg Oral Q M,W,F-HD  . pantoprazole (PROTONIX) IV  40 mg Intravenous Q24H  . potassium chloride  10 mEq Intravenous Once   . sodium chloride 500 mL (09/29/13 1003)  . dextrose 5 % and 0.2 % NaCl 65 mL/hr (09/28/13 1835)  . fentaNYL infusion INTRAVENOUS 25 mcg/hr (09/29/13 1729)  . phenylephrine (NEO-SYNEPHRINE) Adult infusion 100 mcg/min (09/29/13 1736)  . phenylephrine (NEO-SYNEPHRINE) Adult infusion  sodium chloride, sodium chloride, acetaminophen, acetaminophen, alteplase, feeding supplement (NEPRO CARB STEADY), heparin, [START ON 09/30/2013] heparin, lidocaine (PF), lidocaine-prilocaine, pentafluoroprop-tetrafluoroeth, promethazine

## 2013-09-29 NOTE — Progress Notes (Signed)
eLink Physician-Brief Progress Note Patient Name: SUMMIT ARROYAVE DOB: [birth year] MRN: [id number]  Date of Service  09/29/2013   HPI/Events of Note  Patient attempting to reach up and pull ETT.  BP soft with limited room to increase sedation   eICU Interventions  Plan: Bilateral soft wrist restraints for patient safety   Intervention Category Minor Interventions: Routine modifications to care plan (e.g. PRN medications for pain, fever)  Clois Montavon 09/29/2013, 11:48 PM

## 2013-09-29 NOTE — Anesthesia Postprocedure Evaluation (Signed)
  Anesthesia Post-op Note  Patient: Nicholas [name]  Procedure(s) Performed: Procedure(s) with comments: COLONOSCOPY (N/A) - CO2  ultra-slim scope if available  Patient Location: PACU and Endoscopy Unit  Anesthesia Type:MAC  Level of Consciousness: awake  Airway and Oxygen Therapy: Patient Spontanous Breathing  Post-op Pain: mild  Post-op Assessment: Post-op Vital signs reviewed, Patient's Cardiovascular Status Stable, Respiratory Function Stable, Patent Airway, No signs of Nausea or vomiting and Pain level controlled  Post-op Vital Signs: Reviewed and stable  Complications: No apparent anesthesia complications

## 2013-09-29 NOTE — Clinical Social Work Psychosocial (Cosign Needed)
Clinical Social Work Department BRIEF PSYCHOSOCIAL ASSESSMENT 09/29/2013  Patient:  Nicholas [name], Nicholas [name]     Account Number:  [id number]     [place] date:  09/25/2013  Clinical Social Worker:  Frederico Hamman  Date/Time:  09/29/2013 02:07 PM  Referred by:  Physician  Date Referred:  09/27/2013 Referred for  SNF Placement   Other Referral:   Interview type:  Family Other interview type:   Patient has a brother, Nicholas [name]  ([place]) [phone] ([phone].    PSYCHOSOCIAL DATA Living Status:  FACILITY Admitted from facility:  [place], [place] Level of care:  [place] Primary support name:  Nicholas [name] Primary support relationship to patient:  [place] Degree of support available:   Patient has a brother, Nicholas [name].    CURRENT CONCERNS Current Concerns  Post-Acute Placement   Other Concerns:    SOCIAL WORK ASSESSMENT / PLAN CSW intern was not able to meet with patient due to a colonoscopy procedure all day. CSW intern then contact patient's brother Nicholas [name]. Nicholas [name] advised CSW intern that the patient would love to return to [facility] facility. Nicholas [name] was inquiring about patient's discharge. CSW intern informed Nicholas [name] that the patient is not ready today and had a big procedure today. Nicholas [name] will be updated with any more additional information.    Assessment/plan status:  Psychosocial Support/Ongoing Assessment of Needs Other assessment/ plan:   Information/referral to community resources:   SNF Placement.    PATIENT'S/FAMILY'S RESPONSE TO PLAN OF CARE: CSW intern was unable to talk with patient due to a colonoscopy  procedure  being done all day. CSW contacted patient's brother Nicholas [name]. Nicholas [name] seemed concerned and very inquisitive about patient. Nicholas [name] will be updated about patient's discharge plans.       Wellington Hampshire, CSW Intern.

## 2013-09-29 NOTE — Anesthesia Preprocedure Evaluation (Signed)
Anesthesia Evaluation  Patient identified by MRN, date of birth, ID band Patient awake    Reviewed: Allergy & Precautions, H&P , NPO status , Patient's Chart, lab work & pertinent test results  Airway Mallampati: II TM Distance: >3 FB Neck ROM: Limited    Dental   Pulmonary former smoker,  breath sounds clear to auscultation        Cardiovascular hypertension, + CAD, + CABG, + Peripheral Vascular Disease and +CHF Rhythm:Regular Rate:Normal     Neuro/Psych Anxiety Depression  Neuromuscular disease    GI/Hepatic GERD-  ,R/o obstructing colon lesion Last n/v 4 days ago NG in place   Endo/Other  diabetes  Renal/GU Dialysis and ESRFRenal disease     Musculoskeletal   Abdominal (+) + obese,   Peds  Hematology   Anesthesia Other Findings   Reproductive/Obstetrics                           Anesthesia Physical Anesthesia Plan  ASA: IV  Anesthesia Plan: MAC   Post-op Pain Management:    Induction: Intravenous  Airway Management Planned: Simple Face Mask  Additional Equipment:   Intra-op Plan:   Post-operative Plan:   Informed Consent: I have reviewed the patients History and Physical, chart, labs and discussed the procedure including the risks, benefits and alternatives for the proposed anesthesia with the patient or authorized representative who has indicated his/her understanding and acceptance.     Plan Discussed with: CRNA and Surgeon  Anesthesia Plan Comments:         Anesthesia Quick Evaluation

## 2013-09-29 NOTE — Transfer of Care (Signed)
Immediate Anesthesia Transfer of Care Note  Patient: Nicholas [name]  Procedure(s) Performed: Procedure(s): PARTIAL COLECTOMY (Right) COLOSTOMY (N/A)  Patient Location: SICU  Anesthesia Type:General  Level of Consciousness: sedated, patient cooperative and Patient remains intubated per anesthesia plan  Airway & Oxygen Therapy: Patient remains intubated per anesthesia plan and Patient placed on Ventilator (see vital sign flow sheet for setting)  Post-op Assessment: Report given to PACU RN and Post -op Vital signs reviewed and stable  Post vital signs: Reviewed and stable  Complications: No apparent anesthesia complications

## 2013-09-29 NOTE — Clinical Social Work Note (Signed)
CSW reviewed CSW Intern's assessment note and it is approved as written.  **The patient has been moved to 2S and the CSW for this unit will be updated.  Geovanna Simko Givens, MSW, LCSW [phone]

## 2013-09-29 NOTE — Progress Notes (Signed)
Discussed with Dr. Grandville Silos.  Events noted. Appreciate assistance of all. Pt remains on vent/pressors and PCCM consulted.  Please notify [facility] when off vent/pressors to assume care.  Doree Barthel, M.D. [facility] [phone]

## 2013-09-29 NOTE — Anesthesia Procedure Notes (Signed)
Procedure Name: Intubation Date/Time: 09/29/2013 1:56 PM Performed by: Manuela Schwartz B Pre-anesthesia Checklist: Patient identified, Emergency Drugs available, Suction available, Patient being monitored and Timeout performed Patient Re-evaluated:Patient Re-evaluated prior to inductionOxygen Delivery Method: Circle system utilized Preoxygenation: Pre-oxygenation with 100% oxygen Intubation Type: IV induction and Rapid sequence Laryngoscope Size: Mac and 3 Grade View: Grade I Tube type: Oral Tube size: 7.5 mm Number of attempts: 1 Airway Equipment and Method: Stylet Placement Confirmation: ETT inserted through vocal cords under direct vision,  positive ETCO2 and breath sounds checked- equal and bilateral Secured at: 22 cm Tube secured with: Tape Dental Injury: Teeth and Oropharynx as per pre-operative assessment

## 2013-09-29 NOTE — Progress Notes (Signed)
eLink Physician-Brief Progress Note Patient Name: SWAIN ACREE DOB: 1941-04-01 MRN: [id number]  Date of Service  09/29/2013   HPI/Events of Note   Arrived ICU from OR intubated, PCCM consulted  eICU Interventions   Vent orders ABG PCXR Sedation GI/VTE/VAP Px Bedside MD to see Pressors Abx reviewed     Intervention Category Major Interventions: OtherDoree Fudge 09/29/2013, 4:56 PM

## 2013-09-29 NOTE — Op Note (Signed)
[facility] [place], [id number]   COLONOSCOPY PROCEDURE REPORT  PATIENT: Nicholas [name], Nicholas [name]  MR#: [id number] BIRTHDATE: 05-21-1941 , 72  yrs. old GENDER: Male ENDOSCOPIST: Ronald Lobo, MD REFERRED BY:   Dr. Georganna Skeans PROCEDURE DATE:  09/29/2013 PROCEDURE:     Colonoscopy with biopsies ASA CLASS: INDICATIONS:  abnormal CT:  SBO w/ ?obstruction in the region of the cecum MEDICATIONS:  MAC per Anesthesia  DESCRIPTION OF PROCEDURE:  The nature, purpose, and risks of the procedure had been discussed with the patient, who provided written consent and was brought from his hospital room to the [facility] endoscopy unit.  He received MAC sedation and was stable throughout the procedure.  Time out was performed.  Digital exam showed a normal prostate gland.  The Pentax adult video colonoscope was advanced, using CO2 insufflation, through an adequately prepped colon (could not be prepped from above, so he had received a tapwater enema) around the colon to what I believe is the area just above the cecum. At this point, the scope could not be readily further advanced due to looping. In the region of what appeared to be the ileal cecal valve, there was a collar-like semicircumferential mass suggestive of a malignant neoplasm. Several biopsies were obtained from this.  Nearby, on the contralateral part of the colonic wall and slightly distal to the apparent neoplasm, there was severe colitis characterized by mucosal hemorrhage and a focal, fairly deep ulceration. Several biopsies were obtained from this area but not from the ulceration itself.  The transverse colon had some white mucosa suggestive of resolving ischemic colitis. Biopsies were obtained from there as well. The sigmoid region had moderately severe diverticulosis.  Retroflexion in the rectum was normal, as was reinspection of the rectum.  The patient tolerated the procedure  quite well.     COMPLICATIONS: None  ENDOSCOPIC IMPRESSION:  1. 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 Significant colitis with deep ulceration in the proximal colon, endoscopic impression somewhat suggestive of ischemic colitis 3. Mild, apparently resolving colitis in the transverse colon   RECOMMENDATIONS:  1. Await pathology on biopsy results 2. I have notified the surgeons of the endoscopic findings.  Addendum: In recovery, at the time this dictation, the patient is an severe abdominal pain. I question the possibility of a colonic perforation. An x-ray has been ordered and surgery has been notified.    _______________________________ Lorrin MaisRonald Lobo, MD 09/29/2013 11:39 AM     PATIENT NAME:  Nicholas [name], Nicholas [name] MR#: [id number]

## 2013-09-29 NOTE — Transfer of Care (Signed)
Immediate Anesthesia Transfer of Care Note  Patient: Nicholas [name]  Procedure(s) Performed: Procedure(s) with comments: COLONOSCOPY (N/A) - CO2  ultra-slim scope if available  Patient Location: Endoscopy Unit  Anesthesia Type:MAC  Level of Consciousness: awake, alert  and oriented  Airway & Oxygen Therapy: Patient Spontanous Breathing and Patient connected to nasal cannula oxygen  Post-op Assessment: Report given to PACU RN and Post -op Vital signs reviewed and stable  Post vital signs: Reviewed and stable  Complications: No apparent anesthesia complications

## 2013-09-29 NOTE — Anesthesia Postprocedure Evaluation (Signed)
  Anesthesia Post-op Note  Patient: Nicholas [name]  Procedure(s) Performed: Procedure(s): PARTIAL COLECTOMY (Right) COLOSTOMY (N/A)  Patient Location: ICU  Anesthesia Type:General  Level of Consciousness: awake and alert   Airway and Oxygen Therapy: Patient remains intubated per anesthesia plan  Post-op Pain: mild  Post-op Assessment: Post-op Vital signs reviewed, Patient's Cardiovascular Status Stable, Respiratory Function Stable, Patent Airway and Pain level controlled  Post-op Vital Signs: Reviewed and stable  Complications: No apparent anesthesia complications

## 2013-09-29 NOTE — Progress Notes (Signed)
Subjective: Going down for colonoscopy.  Pain not bad, but still bloated.  No N/V, had 761mL out NG tube this am.  No history of abdominal surgeries.  NG tube was clogged and on high, so I flushed it and put it on LIWS.  Objective: Vital signs in last 24 hours: Temp:  [98 F (36.7 C)-98.7 F (37.1 C)] 98.7 F (37.1 C) (02/12 0429) Pulse Rate:  [76-101] 92 (02/12 0429) Resp:  [12-18] 16 (02/12 0429) BP: (94-159)/(53-91) 101/63 mmHg (02/12 0429) SpO2:  [91 %-100 %] 99 % (02/12 0429) Weight:  [226 lb 6.6 oz (102.7 kg)-227 lb 9.6 oz (103.239 kg)] 227 lb 9.6 oz (103.239 kg) (02/11 2021) Last BM Date: 09/25/13  Intake/Output from previous day: 02/11 0701 - 02/12 0700 In: 768.5 [I.V.:428.5; NG/GT:90; IV Piggyback:250] Out: 255 [Emesis/NG output:100; Stool:100] Intake/Output this shift:    PE: Gen:  Alert, NAD, pleasant, frail NG tube draining clear amber fluid with food particles Abd: Soft, NT, moderate distension, tympanic, +BS, no HSM, no abdominal scars noted   Lab Results:   Recent Labs  09/27/13 0500 09/29/13 0508  WBC 16.6* 14.9*  HGB 10.4* 9.9*  HCT 32.7* 31.8*  PLT 317 325   BMET  Recent Labs  09/28/13 0738 09/29/13 0508  NA 139 139  K 4.0 3.2*  CL 98 97  CO2 20 23  GLUCOSE 92 122*  BUN 44* 21  CREATININE 5.07* 3.01*  CALCIUM 7.3* 7.3*   PT/INR  Recent Labs  09/29/13 0508  LABPROT 18.8*  INR 1.62*   CMP     Component Value Date/Time   NA 139 09/29/2013 0508   K 3.2* 09/29/2013 0508   CL 97 09/29/2013 0508   CO2 23 09/29/2013 0508   GLUCOSE 122* 09/29/2013 0508   BUN 21 09/29/2013 0508   CREATININE 3.01* 09/29/2013 0508   CALCIUM 7.3* 09/29/2013 0508   PROT 5.7* 09/25/2013 1621   ALBUMIN 1.7* 09/28/2013 0738   AST 11 09/25/2013 1621   ALT 6 09/25/2013 1621   ALKPHOS 138* 09/25/2013 1621   BILITOT 0.5 09/25/2013 1621   GFRNONAA 20* 09/29/2013 0508   GFRAA 23* 09/29/2013 0508   Lipase     Component Value Date/Time   LIPASE 29 10/22/2012 0900        Studies/Results: Ir Fluoro Guide Cv Line Right  09/28/2013   CLINICAL DATA:  Non functional left arm AV dialysis fistula, not amenable to percutaneous intervention. Dialysis access needed.  EXAM: TUNNELED HEMODIALYSIS CATHETER PLACEMENT WITH ULTRASOUND AND FLUOROSCOPIC GUIDANCE  TECHNIQUE: The procedure, risks, benefits, and alternatives were explained to the patient. Questions regarding the procedure were encouraged and answered. The patient understands and consents to the procedure. As antibiotic prophylaxis, cefazolin 1 g was ordered pre-procedure and administered intravenously within one hour of incision.Patency of the right IJ vein was confirmed with ultrasound with image documentation. An appropriate skin site was determined. Region was prepped using maximum barrier technique including cap and mask, sterile gown, sterile gloves, large sterile sheet, and Chlorhexidine as cutaneous antisepsis. The region was infiltrated locally with 1% lidocaine.  Intravenous Fentanyl and Versed were administered as conscious sedation during continuous cardiorespiratory monitoring by the radiology RN, with a total moderate sedation time of 80 minutes.  Under real-time ultrasound guidance, the right IJ vein was accessed with a 21 gauge micropuncture needle; the needle tip within the vein was confirmed with ultrasound image documentation. Needle exchanged over the 018 guidewire for transitional dilator, which allowed advancement of  a Benson wire into the IVC. Over this, an MPA catheter was advanced. A Hemosplit 19 hemodialysis catheter was tunneled from the right anterior chest wall approach to the right IJ dermatotomy site. The MPA catheter was exchanged over an Amplatz wire for serial vascular dilators which allow placement of a peel-away sheath, through which the catheter was advanced under intermittent fluoroscopy, positioned with its tips in the proximal and midright atrium. Spot chest radiograph confirms good  catheter position. No pneumothorax. Catheter was flushed and primed per protocol. Catheter secured externally with O Prolene sutures. The right IJ dermatotomy site was closed with 3-0 Monocryl subcutaneous suture and covered with Dermabond. No immediate complication.  COMPARISON:  None  FLUOROSCOPY TIME:  10 min 12 seconds  ACCESS: Remains approachable for percutaneous intervention as needed.  IMPRESSION: 1. Technically successful placement of tunneled right IJ hemodialysis catheter with ultrasound and fluoroscopic guidance. Ready for routine use.   Electronically Signed   By: Arne Cleveland M.D.   On: 09/28/2013 14:10   Ir Angio Av Shunt Addl Access  09/28/2013   CLINICAL DATA:  End stage renal disease, poor function of left arm dialysis fistula.  TECHNIQUE: DIALYSIS SHUNTOGRAM  The procedure, risks (including but not limited to bleeding, infection, organ damage ), benefits, and alternatives were explained to the patient. Questions regarding the procedure were encouraged and answered. The patient understands and consents to the procedure.  In preparation for possible declot procedure, the skin overlying the left forearm fistula was prepped with Betadine, draped in usual sterile fashion, infiltrated locally with 1% lidocaine.  Intravenous Fentanyl and Versed were administered as conscious sedation during continuous cardiorespiratory monitoring by the radiology RN, with a total moderate sedation time of eighty minutes.  The outflow vein from the fistula was accessed just central to the fistula with a 21 gauge micropuncture needle under real-time ultrasound guidance. Needle was exchanged over a 018 guide wire for a 4 French transitional dilator. This was used for outflow venography.  The outflow vein was then accessed more centrally retrograde with a 21 gauge micropuncture needle under real-time ultrasound guidance. The needle was exchanged over the 018 wire for the transitional dilator, which allow placement of a  Bentson wire. Over this, a 5 [place] Kumpe catheter was advanced and used to negotiate the arterial anastomosis for additional fistulography. Attempts were made to catheterize the proximal radial artery using a variety of catheter & guidewire combinations but these were ultimately unsuccessful. Assessment of the arterial anatomy of the forearm was performed with a blood pressure cuff at the upper arm level.  Ultimately, No declot procedure was needed. The access sites were removed and hemostasis achieved with manual compression.  No immediate complication.  COMPARISON:  None  FLUOROSCOPY TIME:  10 min 12 seconds  ACCESS: Proximal left radial artery occlusion. Not amenable to percutaneous intervention. Consider surgical consultation.  FINDINGS: The outflow cephalic vein from the fistula is widely patent. Central venous system through the SVC is patent. There is a competing outflow vein at the level of the midforearm. There is stenosis at the arterial anastomosis. The proximal radial artery is occluded, not opacified central to the level of the fistula. There is retrograde flow in the distal radial artery supplied by palmar arches, supplying low-grade flow across the fistula.  IMPRESSION: 1. Proximal left radial artery occlusion. Collateral flow via palmar arches reconstitutes the distal radial artery, with retrograde flow into the fistula, at an inadequate rate. This is not amenable to percutaneous intervention. After telephone  consultation with Dr. Jonnie Finner, plan made to proceed with hemodialysis catheter placement.   Electronically Signed   By: Arne Cleveland M.D.   On: 09/28/2013 14:17   Ir US Guide Vasc Access Left  09/28/2013   CLINICAL DATA:  End stage renal disease, poor function of left arm dialysis fistula.  TECHNIQUE: DIALYSIS SHUNTOGRAM  The procedure, risks (including but not limited to bleeding, infection, organ damage ), benefits, and alternatives were explained to the patient. Questions regarding the  procedure were encouraged and answered. The patient understands and consents to the procedure.  In preparation for possible declot procedure, the skin overlying the left forearm fistula was prepped with Betadine, draped in usual sterile fashion, infiltrated locally with 1% lidocaine.  Intravenous Fentanyl and Versed were administered as conscious sedation during continuous cardiorespiratory monitoring by the radiology RN, with a total moderate sedation time of eighty minutes.  The outflow vein from the fistula was accessed just central to the fistula with a 21 gauge micropuncture needle under real-time ultrasound guidance. Needle was exchanged over a 018 guide wire for a 4 French transitional dilator. This was used for outflow venography.  The outflow vein was then accessed more centrally retrograde with a 21 gauge micropuncture needle under real-time ultrasound guidance. The needle was exchanged over the 018 wire for the transitional dilator, which allow placement of a Bentson wire. Over this, a 5 [place] Kumpe catheter was advanced and used to negotiate the arterial anastomosis for additional fistulography. Attempts were made to catheterize the proximal radial artery using a variety of catheter & guidewire combinations but these were ultimately unsuccessful. Assessment of the arterial anatomy of the forearm was performed with a blood pressure cuff at the upper arm level.  Ultimately, No declot procedure was needed. The access sites were removed and hemostasis achieved with manual compression.  No immediate complication.  COMPARISON:  None  FLUOROSCOPY TIME:  10 min 12 seconds  ACCESS: Proximal left radial artery occlusion. Not amenable to percutaneous intervention. Consider surgical consultation.  FINDINGS: The outflow cephalic vein from the fistula is widely patent. Central venous system through the SVC is patent. There is a competing outflow vein at the level of the midforearm. There is stenosis at the arterial  anastomosis. The proximal radial artery is occluded, not opacified central to the level of the fistula. There is retrograde flow in the distal radial artery supplied by palmar arches, supplying low-grade flow across the fistula.  IMPRESSION: 1. Proximal left radial artery occlusion. Collateral flow via palmar arches reconstitutes the distal radial artery, with retrograde flow into the fistula, at an inadequate rate. This is not amenable to percutaneous intervention. After telephone consultation with Dr. Jonnie Finner, plan made to proceed with hemodialysis catheter placement.   Electronically Signed   By: Arne Cleveland M.D.   On: 09/28/2013 14:17   Ir US Guide Vasc Access Right  09/28/2013   CLINICAL DATA:  Non functional left arm AV dialysis fistula, not amenable to percutaneous intervention. Dialysis access needed.  EXAM: TUNNELED HEMODIALYSIS CATHETER PLACEMENT WITH ULTRASOUND AND FLUOROSCOPIC GUIDANCE  TECHNIQUE: The procedure, risks, benefits, and alternatives were explained to the patient. Questions regarding the procedure were encouraged and answered. The patient understands and consents to the procedure. As antibiotic prophylaxis, cefazolin 1 g was ordered pre-procedure and administered intravenously within one hour of incision.Patency of the right IJ vein was confirmed with ultrasound with image documentation. An appropriate skin site was determined. Region was prepped using maximum barrier technique including cap and mask,  sterile gown, sterile gloves, large sterile sheet, and Chlorhexidine as cutaneous antisepsis. The region was infiltrated locally with 1% lidocaine.  Intravenous Fentanyl and Versed were administered as conscious sedation during continuous cardiorespiratory monitoring by the radiology RN, with a total moderate sedation time of 80 minutes.  Under real-time ultrasound guidance, the right IJ vein was accessed with a 21 gauge micropuncture needle; the needle tip within the vein was confirmed  with ultrasound image documentation. Needle exchanged over the 018 guidewire for transitional dilator, which allowed advancement of a Benson wire into the IVC. Over this, an MPA catheter was advanced. A Hemosplit 19 hemodialysis catheter was tunneled from the right anterior chest wall approach to the right IJ dermatotomy site. The MPA catheter was exchanged over an Amplatz wire for serial vascular dilators which allow placement of a peel-away sheath, through which the catheter was advanced under intermittent fluoroscopy, positioned with its tips in the proximal and midright atrium. Spot chest radiograph confirms good catheter position. No pneumothorax. Catheter was flushed and primed per protocol. Catheter secured externally with O Prolene sutures. The right IJ dermatotomy site was closed with 3-0 Monocryl subcutaneous suture and covered with Dermabond. No immediate complication.  COMPARISON:  None  FLUOROSCOPY TIME:  10 min 12 seconds  ACCESS: Remains approachable for percutaneous intervention as needed.  IMPRESSION: 1. Technically successful placement of tunneled right IJ hemodialysis catheter with ultrasound and fluoroscopic guidance. Ready for routine use.   Electronically Signed   By: Arne Cleveland M.D.   On: 09/28/2013 14:10   Dg Abd 2 Views  09/28/2013   CLINICAL DATA:  Small bowel obstruction. Abdominal distention. NG tube placement.  EXAM: ABDOMEN - 2 VIEW  COMPARISON:  09/27/2013.  FINDINGS: Supine and left lateral decubitus views of the abdomen again demonstrate diffuse dilatation of the small bowel loops in the central abdomen measuring up to 4.3 cm in diameter. Multiple air-fluid levels are noted on the left lateral decubitus view. No frank pneumoperitoneum. Paucity of distal colonic gas, although there is a small amount of rectal gas present. Nasogastric tube appears coiled in the stomach. Postoperative changes of median sternotomy for CABG and bilateral total hip arthroplasty are incidentally  noted.  IMPRESSION: 1. Bowel gas pattern remains compatible with a small bowel obstruction. 2. No pneumoperitoneum at this time. 3. Nasogastric tube remains coiled in the stomach.   Electronically Signed   By: Vinnie Langton M.D.   On: 09/28/2013 10:38   Dg Abd 2 Views  09/27/2013   CLINICAL DATA:  Mid abdominal pain, back pain  EXAM: ABDOMEN - 2 VIEW  COMPARISON:  DG ABD PORTABLE 1V dated 09/26/2013  FINDINGS: Multiple dilated loops small bowel appreciated throughout the abdomen. Air-fluid levels identified. There is no evidence of free air. A moderate amount of stools appreciated region of the ascending colon. There is a paucity of distal bowel gas. The NG tube is seen with tip curled in the region of the stomach. When compared to previous study the dilated loops of small bowel have decreased in size and distribution.  IMPRESSION: Findings consistent with an improving small bowel obstruction. There is no evidence of free air.   Electronically Signed   By: Margaree Mackintosh M.D.   On: 09/27/2013 13:27   Ir Shuntogram/ Fistulagram Left Mod Sed  09/28/2013   CLINICAL DATA:  End stage renal disease, poor function of left arm dialysis fistula.  TECHNIQUE: DIALYSIS SHUNTOGRAM  The procedure, risks (including but not limited to bleeding, infection, organ damage ),  benefits, and alternatives were explained to the patient. Questions regarding the procedure were encouraged and answered. The patient understands and consents to the procedure.  In preparation for possible declot procedure, the skin overlying the left forearm fistula was prepped with Betadine, draped in usual sterile fashion, infiltrated locally with 1% lidocaine.  Intravenous Fentanyl and Versed were administered as conscious sedation during continuous cardiorespiratory monitoring by the radiology RN, with a total moderate sedation time of eighty minutes.  The outflow vein from the fistula was accessed just central to the fistula with a 21 gauge  micropuncture needle under real-time ultrasound guidance. Needle was exchanged over a 018 guide wire for a 4 French transitional dilator. This was used for outflow venography.  The outflow vein was then accessed more centrally retrograde with a 21 gauge micropuncture needle under real-time ultrasound guidance. The needle was exchanged over the 018 wire for the transitional dilator, which allow placement of a Bentson wire. Over this, a 5 [place] Kumpe catheter was advanced and used to negotiate the arterial anastomosis for additional fistulography. Attempts were made to catheterize the proximal radial artery using a variety of catheter & guidewire combinations but these were ultimately unsuccessful. Assessment of the arterial anatomy of the forearm was performed with a blood pressure cuff at the upper arm level.  Ultimately, No declot procedure was needed. The access sites were removed and hemostasis achieved with manual compression.  No immediate complication.  COMPARISON:  None  FLUOROSCOPY TIME:  10 min 12 seconds  ACCESS: Proximal left radial artery occlusion. Not amenable to percutaneous intervention. Consider surgical consultation.  FINDINGS: The outflow cephalic vein from the fistula is widely patent. Central venous system through the SVC is patent. There is a competing outflow vein at the level of the midforearm. There is stenosis at the arterial anastomosis. The proximal radial artery is occluded, not opacified central to the level of the fistula. There is retrograde flow in the distal radial artery supplied by palmar arches, supplying low-grade flow across the fistula.  IMPRESSION: 1. Proximal left radial artery occlusion. Collateral flow via palmar arches reconstitutes the distal radial artery, with retrograde flow into the fistula, at an inadequate rate. This is not amenable to percutaneous intervention. After telephone consultation with Dr. Jonnie Finner, plan made to proceed with hemodialysis catheter  placement.   Electronically Signed   By: Arne Cleveland M.D.   On: 09/28/2013 14:17    Anti-infectives: Anti-infectives   Start     Dose/Rate Route Frequency Ordered Stop   09/29/13 0600  cefUROXime (ZINACEF) 1.5 g in dextrose 5 % 50 mL IVPB     1.5 g 100 mL/hr over 30 Minutes Intravenous On call to O.R. 09/28/13 1711 09/30/13 0559   09/28/13 1200  ceFAZolin (ANCEF) IVPB 2 g/50 mL premix    Comments:  Tube to 17   2 g 100 mL/hr over 30 Minutes Intravenous  Once 09/28/13 1155 09/28/13 1246   09/27/13 1200  vancomycin (VANCOCIN) IVPB 1000 mg/200 mL premix  Status:  Discontinued     1,000 mg 200 mL/hr over 60 Minutes Intravenous Every M-W-F (Hemodialysis) 09/26/13 1638 09/26/13 1721   09/26/13 1745  metroNIDAZOLE (FLAGYL) IVPB 500 mg     500 mg 100 mL/hr over 60 Minutes Intravenous Every 8 hours 09/26/13 1722     09/26/13 1200  vancomycin (VANCOCIN) IVPB 750 mg/150 ml premix  Status:  Discontinued     750 mg 150 mL/hr over 60 Minutes Intravenous Every M-W-F (Hemodialysis) 09/25/13 2344 09/26/13 1051  09/26/13 1200  vancomycin (VANCOCIN) IVPB 1000 mg/200 mL premix  Status:  Discontinued     1,000 mg 200 mL/hr over 60 Minutes Intravenous Every M-W-F (Hemodialysis) 09/26/13 1052 09/26/13 1638   09/26/13 0600  piperacillin-tazobactam (ZOSYN) IVPB 2.25 g  Status:  Discontinued     2.25 g 100 mL/hr over 30 Minutes Intravenous 3 times per day 09/25/13 2126 09/26/13 1721   09/26/13 0000  vancomycin (VANCOCIN) 500 mg in sodium chloride irrigation 0.9 % 100 mL ENEMA  Status:  Discontinued     500 mg Rectal 4 times per day 09/25/13 2337 09/26/13 1722   09/25/13 2130  vancomycin (VANCOCIN) IVPB 750 mg/150 ml premix     750 mg 150 mL/hr over 60 Minutes Intravenous  Once 09/25/13 2124 09/26/13 0145   09/25/13 2000  piperacillin-tazobactam (ZOSYN) IVPB 3.375 g     3.375 g 100 mL/hr over 30 Minutes Intravenous  Once 09/25/13 1853 09/25/13 2002   09/25/13 2000  vancomycin (VANCOCIN) IVPB 1000  mg/200 mL premix     1,000 mg 200 mL/hr over 60 Minutes Intravenous  Once 09/25/13 1859 09/25/13 2113       Assessment/Plan SBO 1.  Not much improvement with conservative manamgent, no flatus/BM. 7106ml out of NGT this am. Unclear of etiology of obstruction, No history of abdominal surgeries. He has fecalization at terminal ileum, enemas unlikely to help with this. Dr. Cristina Gong to evaluate with colonosocpy to rule out a mass today (going down now) 2.  Further recommendations after we known the results of the CSP 3.  Continue NG and NPO for now 4.  SCD's and heparin  Recent back fx  ESRD on HD  CAD  DM      LOS: 4 days    DORT, Sway Guttierrez 09/29/2013, 8:46 AM Pager: [phone]

## 2013-09-29 NOTE — Progress Notes (Signed)
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 The surgeons are aware of this finding.  In the recovery area following the procedure, the patient was in severe abdominal pain. The abdomen is not overtly distended and is somewhat soft, but clinically I suspect a perforation.  I have ordered a KUB and have notified the surgeons, who are coming to see the patient at this time.  Cleotis Nipper, M.D. [phone]

## 2013-09-29 NOTE — Anesthesia Preprocedure Evaluation (Addendum)
Anesthesia Evaluation  Patient identified by MRN, date of birth, ID band Patient awake    Reviewed: Allergy & Precautions, H&P , NPO status , Patient's Chart, lab work & pertinent test results  Airway Mallampati: II      Dental   Pulmonary pneumonia -, COPDformer smoker,  breath sounds clear to auscultation        Cardiovascular hypertension, + CAD, + CABG, + Peripheral Vascular Disease and +CHF + dysrhythmias Atrial Fibrillation Rhythm:Regular Rate:Normal     Neuro/Psych    GI/Hepatic GERD-  ,  Endo/Other  diabetes, Type 2, Insulin Dependent  Renal/GU ESRF and DialysisRenal disease     Musculoskeletal   Abdominal   Peds  Hematology  (+) anemia , hgb 9.9   Anesthesia Other Findings   Reproductive/Obstetrics                        Anesthesia Physical Anesthesia Plan  ASA: III  Anesthesia Plan: General   Post-op Pain Management:    Induction: Intravenous and Rapid sequence  Airway Management Planned: Oral ETT  Additional Equipment:   Intra-op Plan:   Post-operative Plan: Possible Post-op intubation/ventilation  Informed Consent: I have reviewed the patients History and Physical, chart, labs and discussed the procedure including the risks, benefits and alternatives for the proposed anesthesia with the patient or authorized representative who has indicated his/her understanding and acceptance.   Dental advisory given  Plan Discussed with: CRNA, Anesthesiologist and Surgeon  Anesthesia Plan Comments:        Anesthesia Quick Evaluation

## 2013-09-29 NOTE — Progress Notes (Signed)
VASCULAR LAB PRELIMINARY  PRELIMINARY  PRELIMINARY  PRELIMINARY  Right  Upper Extremity Vein Map    Cephalic  Segment Diameter Depth Comment  1. Axilla 2.78 mm mm   2. Mid upper arm 0.91 mm mm   3. Above AC 0.75 mm mm   4. In [facility] Branch 0.87 mm mm  Main vein thrombosed   5. Below AC Branch 1.21 mm mm   6. Mid forearm  mm mm   7. Wrist mm mm    mm mm    mm mm    mm mm    Basilic  Segment Diameter Depth Comment  1. Axilla 2.59 mm 11.6 mm   2. Mid upper arm 1.84 mm 8.3 mm   3. Above AC 1.84 mm 6.17 mm   4. In AC mm mm   5. Below AC mm mm   6. Mid forearm mm mm   7. Wrist mm mm    mm mm    mm mm    mm mm    Left Upper Extremity Vein Map    Cephalic  Segment Diameter Depth Comment  1. Axilla 2.62 mm mm   2. Mid upper arm 2.78 mm mm Branch from here to [facility].  1.11mm to 3.70mm  3. Above AC 3.24 mm mm   4. In AC 3.49 mm mm   5. Below AC mm mm   6. Mid forearm mm mm   7. Wrist mm mm    mm mm    mm mm    mm mm    Basilic  Segment Diameter Depth Comment  1. Axilla 1.97 mm 1.96 mm   2. Mid upper arm 2.08 mm 13.9 mm   3. Above AC 1.68 mm 8.11 mm   4. In AC 1.8 mm 5.85 mm   5. Below AC 1.51 mm 5.04 mm   6. Mid forearm mm mm   7. Wrist mm mm    mm mm    mm mm    mm mm     Vani Gunner, RVT 09/29/2013, 9:21 AM

## 2013-09-29 NOTE — Consult Note (Addendum)
Name: Nicholas [name] MRN: [id number] DOB: 10-May-1941    ADMISSION DATE:  09/25/2013 CONSULTATION DATE:  09/29/2013  REFERRING MD :  Ann Maki PRIMARY SERVICE: [facility]  CHIEF COMPLAINT:   Post op on vent s/p ex lap for SBO.   BRIEF PATIENT DESCRIPTION:  73 y.o. white male with end-stage renal disease status post exploratory laparotomy for small bowel obstruction.  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  The patient with right colectomy and repair of descending and sigmoid serosal tears and ileostomy placement.  The patient is on vasopressor support postop mechanical ventilation and critical care was consult  SIGNIFICANT EVENTS / STUDIES:    LINES / TUBES: ETT 09/29/2013 R tunneled HD cath   CULTURES: none  ANTIBIOTICS: Flagyl 2/9 invanz 2/12  HISTORY OF PRESENT ILLNESS:   72 y.o. white male with end-stage renal disease status post exploratory laparotomy for small bowel obstruction.  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  The patient with right colectomy and repair of descending and sigmoid serosal tears and ileostomy placement.  The patient is on vasopressor support postop mechanical ventilation and critical care was consult  Recent past history consistent with C. difficile colitis on IV Flagyl, coronary artery disease with atrial fibrillation on no anticoagulation, chronic anemia, hypertension, end-stage renal disease.     PAST MEDICAL HISTORY :  Past Medical History  Diagnosis Date  . Coronary artery disease     a. hx of MI 1992; b. 10/2012 NSTEMI (no cath 2/2 GIB);  c. 11/2012 MV: Inf defect w inf and septal HK (favor prior MI), EF 47%;  d. 12/2012 Cath: Severe 3vd, EF 45-50%;  e. 01/2013 CABG x 5 (LIMA->LAD, VG->D1, VG->OM1->OM2, VG->PDA)  . Diabetes mellitus   . Hypertension   . Arthritis   . Carotid artery disease    a. 01/2013 doppler: >80% RICA stenosis, <39% LICA stenosis;  b. 01/2013 R CEA.  . Gastritis 2012 and 09/2011    treated for H Pylori in 12/2011  . Diabetic neuropathy   . Osteoporosis   . COPD (chronic obstructive pulmonary disease)   . Anxiety   . Depression   . ESRD (end stage renal disease)     M-W-Sa  [name]  . Diabetic nephropathy   . GERD (gastroesophageal reflux disease)   . Neuromuscular disorder     peripheral neurotherapy  . Anemia   . A-fib     a. Dx 10/2012, on amio, no anticoagulation 2/2 GIB 10/2012.  [name] GIB (gastrointestinal bleeding)     a. 10/2012, awaiting colonoscopy.   Past Surgical History  Procedure Laterality Date  . Coronary stent placement    . Hip surgery      both hips  . Av fistula placement      Left arm  . Cardiac catheterization    . Joint replacement Bilateral   . Eye surgery Bilateral     cararacts  . Coronary artery bypass graft N/A 02/03/2013    Procedure: CORONARY ARTERY BYPASS GRAFTING (CABG);  Surgeon: Loreli Slot, MD;  Location: [facility] OR;  Service: Open Heart Surgery;  Laterality: N/A;  . Endarterectomy N/A 02/03/2013    Procedure: ENDARTERECTOMY CAROTID;  Surgeon: Pryor Ochoa, MD;  Location: [facility] OR;  Service: Vascular;  Laterality: N/A;  . Olecranon bursectomy Right 07/28/2013    Procedure: RIGHT  ELBOW OLECRANON BURSECTOMY ADVANCEMENT CLOSURE ;  Surgeon: Linna Hoff, MD;  Location: [facility];  Service: Orthopedics;  Laterality: Right;   Prior to Admission medications   Medication Sig Start Date End Date Taking? Authorizing Provider  acetaminophen (TYLENOL) 500 MG tablet Take 500 mg by mouth every 4 (four) hours as needed (pain).    Yes Historical Provider, MD  allopurinol (ZYLOPRIM) 100 MG tablet Take 100 mg by mouth 2 (two) times daily.    Yes Historical Provider, MD  amiodarone (PACERONE) 200 MG tablet Take 200 mg by mouth daily.   Yes Historical Provider, MD  aspirin 325 MG tablet Take 325 mg by mouth daily.   Yes Historical  Provider, MD  bisacodyl (DULCOLAX) 10 MG suppository Place 1 suppository (10 mg total) rectally daily as needed for moderate constipation (gas pain). 09/20/13  Yes Consuella Lose, MD  calcitonin, salmon, (MIACALCIN/FORTICAL) 200 UNIT/ACT nasal spray Place 1 spray into alternate nostrils daily as needed.   Yes Historical Provider, MD  darbepoetin (ARANESP) 60 MCG/0.3ML SOLN injection Inject 0.3 mLs (60 mcg total) into the vein every Monday with hemodialysis. 09/20/13  Yes Consuella Lose, MD  docusate sodium 100 MG CAPS Take 100 mg by mouth 2 (two) times daily as needed for mild constipation. 09/20/13  Yes Consuella Lose, MD  doxercalciferol (HECTOROL) 4 MCG/2ML injection Inject 1 mL (2 mcg total) into the vein every Monday, Wednesday, and Friday with hemodialysis. 09/20/13  Yes Consuella Lose, MD  feeding supplement, RESOURCE BREEZE, (RESOURCE BREEZE) LIQD Take 1 Container by mouth daily. 09/20/13  Yes Consuella Lose, MD  fish oil-omega-3 fatty acids 1000 MG capsule Take 1 g by mouth 2 (two) times daily.    Yes Historical Provider, MD  gabapentin (NEURONTIN) 300 MG capsule Take 1 capsule (300 mg total) by mouth 2 (two) times daily. 02/15/13  Yes Donielle Liston Alba, PA-C  HYDROcodone-acetaminophen (NORCO/VICODIN) 5-325 MG per tablet Take 1 tablet by mouth 2 (two) times daily.   Yes Historical Provider, MD  insulin aspart (NOVOLOG) 100 UNIT/ML injection Inject 0-9 Units into the skin 3 (three) times daily with meals. 09/20/13  Yes Consuella Lose, MD  ipratropium (ATROVENT) 0.06 % nasal spray Place 2 sprays into both nostrils 3 (three) times daily.    Yes Historical Provider, MD  lidocaine (LIDODERM) 5 % Place 1 patch onto the skin daily. Remove & Discard patch within 12 hours or as directed by MD   Yes Historical Provider, MD  magnesium hydroxide (MILK OF MAGNESIA) 400 MG/5ML suspension Take by mouth every 12 (twelve) hours as needed for mild constipation.   Yes Historical Provider, MD    metoCLOPramide (REGLAN) 5 MG tablet Take 5 mg by mouth 3 (three) times daily.   Yes Historical Provider, MD  midodrine (PROAMATINE) 5 MG tablet Take 5 mg by mouth 3 (three) times a week. Take once daily in the morning on dialysis days (usually on Monday, Wednesday and Friday)   Yes Historical Provider, MD  multivitamin (RENA-VIT) TABS tablet Take 1 tablet by mouth at bedtime. 09/20/13  Yes Consuella Lose, MD  Nutritional Supplements (FEEDING SUPPLEMENT, NEPRO CARB STEADY,) LIQD Take 237 mLs by mouth 2 (two) times daily. 09/20/13  Yes Consuella Lose, MD  omeprazole (PRILOSEC) 20 MG capsule Take 20 mg by mouth 2 (two) times daily.   Yes Historical Provider, MD  oxyCODONE (OXY IR/ROXICODONE) 5 MG immediate release tablet Take 5-10 mg by mouth every 4 (four) hours as needed for moderate pain or severe pain.  Yes Historical Provider, MD  pantoprazole (PROTONIX) 40 MG tablet Take 40 mg by mouth daily.   Yes Historical Provider, MD  polyethylene glycol (MIRALAX / GLYCOLAX) packet Take 17 g by mouth daily.   Yes Historical Provider, MD  promethazine (PHENERGAN) 25 MG tablet Take 25 mg by mouth every 12 (twelve) hours as needed for nausea or vomiting.   Yes Historical Provider, MD  saccharomyces boulardii (FLORASTOR) 250 MG capsule Take 1 capsule (250 mg total) by mouth 2 (two) times daily. 09/20/13  Yes Consuella Lose, MD  senna (SENOKOT) 8.6 MG TABS tablet Take 1 tablet by mouth 2 (two) times daily.   Yes Historical Provider, MD  sevelamer carbonate (RENVELA) 800 MG tablet Take 800 mg by mouth 3 (three) times daily with meals.   Yes Historical Provider, MD  simethicone (MYLICON) 80 MG chewable tablet Chew 2 tablets (160 mg total) by mouth 4 (four) times daily as needed for flatulence. 09/20/13  Yes Consuella Lose, MD  sorbitol 70 % solution Take 15 mLs by mouth daily as needed.   Yes Historical Provider, MD  vancomycin (VANCOCIN) 50 mg/mL oral solution Take 2.5 mLs (125 mg total) by mouth 4 (four)  times daily. 09/20/13  Yes Consuella Lose, MD  Witch Hazel (PREPARATION H EX) Apply 1 application topically every 6 (six) hours as needed (hemorrhoids).   Yes Historical Provider, MD  zolpidem (AMBIEN) 10 MG tablet Take 10 mg by mouth at bedtime.   Yes Historical Provider, MD   Allergies  Allergen Reactions  . Statins Other (See Comments)    Muscle weakness     FAMILY HISTORY:  Family History  Problem Relation Age of Onset  . Heart attack Father 62  . Heart disease Father   . Stroke Mother   . Hypertension Mother   . Other Mother     varicose veins  . Stroke Maternal Grandmother   . Hypertension Brother    SOCIAL HISTORY:  reports that he quit smoking about 23 years ago. His smoking use included Cigarettes. He started smoking about 48 years ago. He has a 25 pack-year smoking history. He has never used smokeless tobacco. He reports that he drinks alcohol. He reports that he does not use illicit drugs.  REVIEW OF SYSTEMS:   Not obtainable  SUBJECTIVE:   VITAL SIGNS: Temp:  [96.6 F (35.9 C)-98.7 F (37.1 C)] 97.8 F (36.6 C) (02/12 1950) Pulse Rate:  [69-107] 81 (02/12 2036) Resp:  [7-24] 18 (02/12 2036) BP: (69-164)/(51-94) 101/52 mmHg (02/12 2036) SpO2:  [96 %-100 %] 100 % (02/12 2036) FiO2 (%):  [40 %-50 %] 40 % (02/12 2036) HEMODYNAMICS:  on low-dose NEO    VENTILATOR SETTINGS: Vent Mode:  [-] PRVC FiO2 (%):  [40 %-50 %] 40 % Set Rate:  [12 bmp] 12 bmp Vt Set:  [500 mL] 500 mL PEEP:  [5 cmH20] 5 cmH20 Plateau Pressure:  [12 cmH20] 12 cmH20 INTAKE / OUTPUT: Intake/Output     02/12 0701 - 02/13 0700   I.V. (mL/kg) 1614.9 (15.6)   NG/GT 60   IV Piggyback 400   Total Intake(mL/kg) 2074.9 (20.1)   Emesis/NG output 700   Other    Stool 25   Blood 200   Total Output 925   Net +1149.9         PHYSICAL EXAMINATION: General:  Ill-appearing elderly male orally intubated Neuro:  Awake and alert on low-dose fentanyl drip HEENT:  Dry mucous  membranes Cardiovascular:  Regular rate and rhythm normal  S1-S2 Lungs:  Distant breath sounds no wheeze or rhonchi Abdomen:  Postop abdomen Musculoskeletal:  Full range of motion Skin:  Clear  LABS:  CBC  Recent Labs Lab 09/26/13 0647 09/27/13 0500 09/29/13 0508  WBC 16.3* 16.6* 14.9*  HGB 10.2* 10.4* 9.9*  HCT 33.1* 32.7* 31.8*  PLT 327 317 325   Coag's  Recent Labs Lab 09/29/13 0508  INR 1.62*   BMET  Recent Labs Lab 09/27/13 0500 09/28/13 0738 09/29/13 0508  NA 140 139 139  K 4.4 4.0 3.2*  CL 97 98 97  CO2 23 20 23   BUN 58* 44* 21  CREATININE 6.29* 5.07* 3.01*  GLUCOSE 84 92 122*   Electrolytes  Recent Labs Lab 09/27/13 0500 09/28/13 0738 09/29/13 0508  CALCIUM 7.3* 7.3* 7.3*  PHOS 5.4* 4.6  --    Sepsis Markers  Recent Labs Lab 09/25/13 1634  LATICACIDVEN 0.86   ABG  Recent Labs Lab 09/29/13 1744  PHART 7.430  PCO2ART 29.6*  PO2ART 151.0*   Liver Enzymes  Recent Labs Lab 09/25/13 1621 09/27/13 0500 09/28/13 0738  AST 11  --   --   ALT 6  --   --   ALKPHOS 138*  --   --   BILITOT 0.5  --   --   ALBUMIN 2.0* 1.7* 1.7*   Cardiac Enzymes No results found for this basename: TROPONINI, PROBNP,  in the last 168 hours Glucose  Recent Labs Lab 09/28/13 2011 09/29/13 0009 09/29/13 0419 09/29/13 0813 09/29/13 1329 09/29/13 2010  GLUCAP 80 128* 117* 129* 100* 166*    Imaging Ir Fluoro Guide Cv Line Right  09/28/2013   CLINICAL DATA:  Non functional left arm AV dialysis fistula, not amenable to percutaneous intervention. Dialysis access needed.  EXAM: TUNNELED HEMODIALYSIS CATHETER PLACEMENT WITH ULTRASOUND AND FLUOROSCOPIC GUIDANCE  TECHNIQUE: The procedure, risks, benefits, and alternatives were explained to the patient. Questions regarding the procedure were encouraged and answered. The patient understands and consents to the procedure. As antibiotic prophylaxis, cefazolin 1 g was ordered pre-procedure and administered  intravenously within one hour of incision.Patency of the right IJ vein was confirmed with ultrasound with image documentation. An appropriate skin site was determined. Region was prepped using maximum barrier technique including cap and mask, sterile gown, sterile gloves, large sterile sheet, and Chlorhexidine as cutaneous antisepsis. The region was infiltrated locally with 1% lidocaine.  Intravenous Fentanyl and Versed were administered as conscious sedation during continuous cardiorespiratory monitoring by the radiology RN, with a total moderate sedation time of 80 minutes.  Under real-time ultrasound guidance, the right IJ vein was accessed with a 21 gauge micropuncture needle; the needle tip within the vein was confirmed with ultrasound image documentation. Needle exchanged over the 018 guidewire for transitional dilator, which allowed advancement of a Benson wire into the IVC. Over this, an MPA catheter was advanced. A Hemosplit 19 hemodialysis catheter was tunneled from the right anterior chest wall approach to the right IJ dermatotomy site. The MPA catheter was exchanged over an Amplatz wire for serial vascular dilators which allow placement of a peel-away sheath, through which the catheter was advanced under intermittent fluoroscopy, positioned with its tips in the proximal and midright atrium. Spot chest radiograph confirms good catheter position. No pneumothorax. Catheter was flushed and primed per protocol. Catheter secured externally with O Prolene sutures. The right IJ dermatotomy site was closed with 3-0 Monocryl subcutaneous suture and covered with Dermabond. No immediate complication.  COMPARISON:  None  FLUOROSCOPY TIME:  10 min 12 seconds  ACCESS: Remains approachable for percutaneous intervention as needed.  IMPRESSION: 1. Technically successful placement of tunneled right IJ hemodialysis catheter with ultrasound and fluoroscopic guidance. Ready for routine use.   Electronically Signed   By:  Arne Cleveland M.D.   On: 09/28/2013 14:10   Ir Angio Av Shunt Addl Access  09/28/2013   CLINICAL DATA:  End stage renal disease, poor function of left arm dialysis fistula.  TECHNIQUE: DIALYSIS SHUNTOGRAM  The procedure, risks (including but not limited to bleeding, infection, organ damage ), benefits, and alternatives were explained to the patient. Questions regarding the procedure were encouraged and answered. The patient understands and consents to the procedure.  In preparation for possible declot procedure, the skin overlying the left forearm fistula was prepped with Betadine, draped in usual sterile fashion, infiltrated locally with 1% lidocaine.  Intravenous Fentanyl and Versed were administered as conscious sedation during continuous cardiorespiratory monitoring by the radiology RN, with a total moderate sedation time of eighty minutes.  The outflow vein from the fistula was accessed just central to the fistula with a 21 gauge micropuncture needle under real-time ultrasound guidance. Needle was exchanged over a 018 guide wire for a 4 French transitional dilator. This was used for outflow venography.  The outflow vein was then accessed more centrally retrograde with a 21 gauge micropuncture needle under real-time ultrasound guidance. The needle was exchanged over the 018 wire for the transitional dilator, which allow placement of a Bentson wire. Over this, a 5 [place] Kumpe catheter was advanced and used to negotiate the arterial anastomosis for additional fistulography. Attempts were made to catheterize the proximal radial artery using a variety of catheter & guidewire combinations but these were ultimately unsuccessful. Assessment of the arterial anatomy of the forearm was performed with a blood pressure cuff at the upper arm level.  Ultimately, No declot procedure was needed. The access sites were removed and hemostasis achieved with manual compression.  No immediate complication.  COMPARISON:  None   FLUOROSCOPY TIME:  10 min 12 seconds  ACCESS: Proximal left radial artery occlusion. Not amenable to percutaneous intervention. Consider surgical consultation.  FINDINGS: The outflow cephalic vein from the fistula is widely patent. Central venous system through the SVC is patent. There is a competing outflow vein at the level of the midforearm. There is stenosis at the arterial anastomosis. The proximal radial artery is occluded, not opacified central to the level of the fistula. There is retrograde flow in the distal radial artery supplied by palmar arches, supplying low-grade flow across the fistula.  IMPRESSION: 1. Proximal left radial artery occlusion. Collateral flow via palmar arches reconstitutes the distal radial artery, with retrograde flow into the fistula, at an inadequate rate. This is not amenable to percutaneous intervention. After telephone consultation with Dr. Jonnie Finner, plan made to proceed with hemodialysis catheter placement.   Electronically Signed   By: Arne Cleveland M.D.   On: 09/28/2013 14:17   Ir US Guide Vasc Access Left  09/28/2013   CLINICAL DATA:  End stage renal disease, poor function of left arm dialysis fistula.  TECHNIQUE: DIALYSIS SHUNTOGRAM  The procedure, risks (including but not limited to bleeding, infection, organ damage ), benefits, and alternatives were explained to the patient. Questions regarding the procedure were encouraged and answered. The patient understands and consents to the procedure.  In preparation for possible declot procedure, the skin overlying the left forearm fistula was prepped with Betadine, draped in usual sterile fashion, infiltrated locally with  1% lidocaine.  Intravenous Fentanyl and Versed were administered as conscious sedation during continuous cardiorespiratory monitoring by the radiology RN, with a total moderate sedation time of eighty minutes.  The outflow vein from the fistula was accessed just central to the fistula with a 21 gauge  micropuncture needle under real-time ultrasound guidance. Needle was exchanged over a 018 guide wire for a 4 French transitional dilator. This was used for outflow venography.  The outflow vein was then accessed more centrally retrograde with a 21 gauge micropuncture needle under real-time ultrasound guidance. The needle was exchanged over the 018 wire for the transitional dilator, which allow placement of a Bentson wire. Over this, a 5 [place] Kumpe catheter was advanced and used to negotiate the arterial anastomosis for additional fistulography. Attempts were made to catheterize the proximal radial artery using a variety of catheter & guidewire combinations but these were ultimately unsuccessful. Assessment of the arterial anatomy of the forearm was performed with a blood pressure cuff at the upper arm level.  Ultimately, No declot procedure was needed. The access sites were removed and hemostasis achieved with manual compression.  No immediate complication.  COMPARISON:  None  FLUOROSCOPY TIME:  10 min 12 seconds  ACCESS: Proximal left radial artery occlusion. Not amenable to percutaneous intervention. Consider surgical consultation.  FINDINGS: The outflow cephalic vein from the fistula is widely patent. Central venous system through the SVC is patent. There is a competing outflow vein at the level of the midforearm. There is stenosis at the arterial anastomosis. The proximal radial artery is occluded, not opacified central to the level of the fistula. There is retrograde flow in the distal radial artery supplied by palmar arches, supplying low-grade flow across the fistula.  IMPRESSION: 1. Proximal left radial artery occlusion. Collateral flow via palmar arches reconstitutes the distal radial artery, with retrograde flow into the fistula, at an inadequate rate. This is not amenable to percutaneous intervention. After telephone consultation with Dr. Arlean Hopping, plan made to proceed with hemodialysis catheter  placement.   Electronically Signed   By: Oley Balm M.D.   On: 09/28/2013 14:17   Ir US Guide Vasc Access Right  09/28/2013   CLINICAL DATA:  Non functional left arm AV dialysis fistula, not amenable to percutaneous intervention. Dialysis access needed.  EXAM: TUNNELED HEMODIALYSIS CATHETER PLACEMENT WITH ULTRASOUND AND FLUOROSCOPIC GUIDANCE  TECHNIQUE: The procedure, risks, benefits, and alternatives were explained to the patient. Questions regarding the procedure were encouraged and answered. The patient understands and consents to the procedure. As antibiotic prophylaxis, cefazolin 1 g was ordered pre-procedure and administered intravenously within one hour of incision.Patency of the right IJ vein was confirmed with ultrasound with image documentation. An appropriate skin site was determined. Region was prepped using maximum barrier technique including cap and mask, sterile gown, sterile gloves, large sterile sheet, and Chlorhexidine as cutaneous antisepsis. The region was infiltrated locally with 1% lidocaine.  Intravenous Fentanyl and Versed were administered as conscious sedation during continuous cardiorespiratory monitoring by the radiology RN, with a total moderate sedation time of 80 minutes.  Under real-time ultrasound guidance, the right IJ vein was accessed with a 21 gauge micropuncture needle; the needle tip within the vein was confirmed with ultrasound image documentation. Needle exchanged over the 018 guidewire for transitional dilator, which allowed advancement of a Benson wire into the IVC. Over this, an MPA catheter was advanced. A Hemosplit 19 hemodialysis catheter was tunneled from the right anterior chest wall approach to the right IJ dermatotomy site.  The MPA catheter was exchanged over an Amplatz wire for serial vascular dilators which allow placement of a peel-away sheath, through which the catheter was advanced under intermittent fluoroscopy, positioned with its tips in the proximal  and midright atrium. Spot chest radiograph confirms good catheter position. No pneumothorax. Catheter was flushed and primed per protocol. Catheter secured externally with O Prolene sutures. The right IJ dermatotomy site was closed with 3-0 Monocryl subcutaneous suture and covered with Dermabond. No immediate complication.  COMPARISON:  None  FLUOROSCOPY TIME:  10 min 12 seconds  ACCESS: Remains approachable for percutaneous intervention as needed.  IMPRESSION: 1. Technically successful placement of tunneled right IJ hemodialysis catheter with ultrasound and fluoroscopic guidance. Ready for routine use.   Electronically Signed   By: Arne Cleveland M.D.   On: 09/28/2013 14:10   Dg Chest Port 1 View  09/29/2013   CLINICAL DATA:  Status post right partial colectomy  EXAM: PORTABLE CHEST - 1 VIEW  COMPARISON:  02/17/2013  FINDINGS: Cardiac shadow remains mildly enlarged. A dialysis catheter is seen in satisfactory position. An endotracheal tube is now noted 3.8 cm above the carina. A nasogastric catheter extends into the stomach. Bilateral pleural effusions left greater than right are seen. The left effusion appears chronic in nature when compare with the previous exam from multiple months previous. No new focal abnormality is seen.  IMPRESSION: Tubes and lines as described.  Pleural effusions left greater than right. The left effusion appears chronic in nature.   Electronically Signed   By: Inez Catalina M.D.   On: 09/29/2013 18:00   Dg Abd 2 Views  09/28/2013   CLINICAL DATA:  Small bowel obstruction. Abdominal distention. NG tube placement.  EXAM: ABDOMEN - 2 VIEW  COMPARISON:  09/27/2013.  FINDINGS: Supine and left lateral decubitus views of the abdomen again demonstrate diffuse dilatation of the small bowel loops in the central abdomen measuring up to 4.3 cm in diameter. Multiple air-fluid levels are noted on the left lateral decubitus view. No frank pneumoperitoneum. Paucity of distal colonic gas, although  there is a small amount of rectal gas present. Nasogastric tube appears coiled in the stomach. 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  IMPRESSION: 1. Bowel gas pattern remains compatible with a small bowel obstruction. 2. No pneumoperitoneum at this time. 3. Nasogastric tube remains coiled in the stomach.   Electronically Signed   By: Vinnie Langton M.D.   On: 09/28/2013 10:38   Dg Abd Portable 2v  09/29/2013   CLINICAL DATA:  Status post colonoscopy. Question free intraperitoneal air.  EXAM: PORTABLE ABDOMEN - 2 VIEW  COMPARISON:  CT abdomen and pelvis 09/25/2013.  FINDINGS: There is a large volume of free intraperitoneal air. NG tube is in place with tip in the stomach.  IMPRESSION: Study is positive for free intraperitoneal air.  Critical Value/emergent results were called by telephone at the time of interpretation on 09/29/2013 at 12:27 PM to Dr. Ronald Lobo , who verbally acknowledged these results.   Electronically Signed   By: Inge Rise M.D.   On: 09/29/2013 12:30   Ir Shuntogram/ Fistulagram Left Mod Sed  09/28/2013   CLINICAL DATA:  End stage renal disease, poor function of left arm dialysis fistula.  TECHNIQUE: DIALYSIS SHUNTOGRAM  The procedure, risks (including but not limited to bleeding, infection, organ damage ), benefits, and alternatives were explained to the patient. Questions regarding the procedure were encouraged and answered. The patient  understands and consents to the procedure.  In preparation for possible declot procedure, the skin overlying the left forearm fistula was prepped with Betadine, draped in usual sterile fashion, infiltrated locally with 1% lidocaine.  Intravenous Fentanyl and Versed were administered as conscious sedation during continuous cardiorespiratory monitoring by the radiology RN, with a total moderate sedation time of eighty minutes.  The outflow vein from the fistula was accessed  just central to the fistula with a 21 gauge micropuncture needle under real-time ultrasound guidance. Needle was exchanged over a 018 guide wire for a 4 French transitional dilator. This was used for outflow venography.  The outflow vein was then accessed more centrally retrograde with a 21 gauge micropuncture needle under real-time ultrasound guidance. The needle was exchanged over the 018 wire for the transitional dilator, which allow placement of a Bentson wire. Over this, a 5 [place] Kumpe catheter was advanced and used to negotiate the arterial anastomosis for additional fistulography. Attempts were made to catheterize the proximal radial artery using a variety of catheter & guidewire combinations but these were ultimately unsuccessful. Assessment of the arterial anatomy of the forearm was performed with a blood pressure cuff at the upper arm level.  Ultimately, No declot procedure was needed. The access sites were removed and hemostasis achieved with manual compression.  No immediate complication.  COMPARISON:  None  FLUOROSCOPY TIME:  10 min 12 seconds  ACCESS: Proximal left radial artery occlusion. Not amenable to percutaneous intervention. Consider surgical consultation.  FINDINGS: The outflow cephalic vein from the fistula is widely patent. Central venous system through the SVC is patent. There is a competing outflow vein at the level of the midforearm. There is stenosis at the arterial anastomosis. The proximal radial artery is occluded, not opacified central to the level of the fistula. There is retrograde flow in the distal radial artery supplied by palmar arches, supplying low-grade flow across the fistula.  IMPRESSION: 1. Proximal left radial artery occlusion. Collateral flow via palmar arches reconstitutes the distal radial artery, with retrograde flow into the fistula, at an inadequate rate. This is not amenable to percutaneous intervention. After telephone consultation with Dr. Jonnie Finner, plan made to  proceed with hemodialysis catheter placement.   Electronically Signed   By: Arne Cleveland M.D.   On: 09/28/2013 14:17     CXR: Endotracheal tube in good position with no active infiltrate  ASSESSMENT / PLAN:  PULMONARY A: Expected ventilatory support status post exploratory laparotomy for colonic mass with obstruction and perforation P:   Manage on full vent support overnight with hopeful extubation in the morning and can be weaned off vasopressors  CARDIOVASCULAR A: Shock state postop with perforation of bowel likely sepsis related P:  Titrate vasopressors but try to avoid excess volume given patient's dialysis state Check lactic acid  RENAL A:  End-stage renal disease P:   Obtain other access of HD catheter can be used for dialysis or CVVHD necessary  GASTROINTESTINAL A:  Perforation of colon with colonic mass and colonic obstruction status post laparotomy History of Clostridium difficile colitis History of GI bleeding P:   Per central [place] surgery We'll need early nutrition  HEMATOLOGIC A:  Acute blood loss anemia P:  Transfuse as indicated  INFECTIOUS A:  Sepsis with septic shock state P:   Check lactate Continue antibiotics per dashboard Followup microbiologic studies  ENDOCRINE A:  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 Plan is for ventilatory support overnight and associated sepsis treatment  I have personally obtained a history, examined the patient, evaluated laboratory and imaging results, formulated the assessment and plan and placed orders. CRITICAL CARE: The patient is  critically ill with multiple organ systems failure and requires high complexity decision making for assessment and support, frequent evaluation and titration of therapies, application of advanced monitoring technologies and extensive interpretation of multiple databases. Critical Care Time devoted to patient care services described in this note is 40 minutes.   Mariel Sleet Beeper  [phone]  Cell  [phone]  If no response or cell goes to voicemail, call beeper [phone]  [facility] [place] Pager: [phone]  09/29/2013, 8:53 PM

## 2013-09-29 NOTE — Progress Notes (Signed)
Dr. Cristina Gong found a large mass near the ileocecal valve on colonoscopy. Patient is having more pain afterward in endoscopy recovery.  There was also evidence of ischemic colitis near the mass. X-rays pending due to concern for perforation. We need to proceed with urgent right colectomy and possible ileostomy today. Will give perioperative antibiotics. Procedure risks and benefits D/W his brother by phone and consent obtained. We also spoke with the patient but he has had recent medications. Patient examined and I agree with the assessment and plan  Georganna Skeans, MD, MPH, FACS Pager: [phone]  09/29/2013 12:31 PM

## 2013-09-29 NOTE — Progress Notes (Signed)
Patient seen during colonoscopy.  Cecal mass was biopsied. Will d/w gen surg their plans for resection Will cancel HD access for today

## 2013-09-29 NOTE — Preoperative (Signed)
Beta Blockers   Reason not to administer Beta Blockers:Not Applicable 

## 2013-09-29 NOTE — Progress Notes (Signed)
The patient is headed to the OR at this time. I have discussed the case and reviewed the lateral decubitus film with Dr. Grandville Silos of surgery.  I explained to the patient that I suspected a perforation and that it does appear he has cancer.  I received a call from the radiologist that the patient did indeed have free air under the diaphragm, consistent with colonoscopic perforation.  Cleotis Nipper, M.D. [phone]

## 2013-09-29 NOTE — Op Note (Signed)
09/25/2013 - 09/29/2013  3:55 PM  PATIENT:  Nicholas [name]  73 y.o. male  PRE-OPERATIVE DIAGNOSIS:  Obstructing colon mass at the ileocecal valve with perforation  POST-OPERATIVE DIAGNOSIS:  1. Obstructing colon mass at the ileocecal valve with perforation of the right colon      2. Serosal injuries to the ascending colon x1 and sigmoid colon x2  PROCEDURE:  Procedure(s): RIGHT COLECTOMY REPAIR DESCENDING AND SIGMOID SEROSAL TEARS ILEOSTOMY  SURGEON:  Surgeon(s): Zenovia Jarred, MD  ASSISTANTS: Coralie Keens, PAC   ANESTHESIA:   general  EBL:  Total I/O In: 800 [I.V.:800] Out: 900 [Emesis/NG output:700; Blood:200]  BLOOD ADMINISTERED:none  DRAINS: none   SPECIMEN:  Excision  DISPOSITION OF SPECIMEN:  PATHOLOGY  COUNTS:  YES  DICTATION: .Dragon Dictation  Patient underwent colonoscopy for suspected obstructing mass near the ileocecal valve. 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 He developed increased abdominal pain after the colonoscopy. Free air was noted on plain films. He is brought for emergent right colectomy. Informed consent was obtained from the patient's brother. He received intravenous antibiotics. He was brought to the operating room and general endotracheal anesthesia was administered by the anesthesia staff. His abdomen was prepped and draped in sterile fashion. Time out procedure was performed. Midline incision was made. Subcutaneous tissues were dissected down revealing the anterior fascia. This was divided along the midline and the perineal cavity was carefully entered under direct vision. Free air was present. Fascia was opened the length of the incision. Exploration revealed a moderate amount of ascites which was evacuated. There was no free stool contamination. Small bowel is dilated and the distal ileum had a lot of inspissated stool within. Tumor was located at the ileocecal valve area of the cecum. Right colon had evidence of a large  serosal injury with small perforation, likely the cause of the free air. The right colon also had evidence of ischemia.The remainder of the colon was inspected. Transverse colon was a bit ischemic appearing in the proximal portion but otherwise appeared intact. Descending colon had a serosal injury that did not appear full thickness. This was Small and was repaired with interrupted 2-0 silk Lembert sutures.. The sigmoid colon had 2 small serosal tears as well. These did not appear to be full-thickness but were both repaired with interrupted 2-0 silk Lembert sutures. Attention was redirected to the tumor. Terminal ileum and cecum were mobilized from lateral peritoneal attachments. Right colon was mobilized along the white line of Toldt. There were several adhesions up to the liver of omentum near the gallbladder. These were taken down carefully getting good hemostasis. Hepatic flexure was mobilized carefully. Duodenum was identified and protected. Proximal transverse colon appeared a little ischemic. The more distal transverse colon was pink and viable. A point for division was chosen in the transverse colon was divided with GIA-75 stapler. Gastrocolic omentum was taken down with LigaSure. Remainder the omentum was divided at the line of division. Colon was further mobilized. Mesentery was then divided using LigaSure. The ileocolic, right colic, and middle colic vessels were clamped and either double tied with 2-0 silk or suture ligatured with 2-0 silk achieving excellent hemostasis. Specimen was sent to pathology. Small bowel was run from the resection point back to the ligament of Treitz. Some proximal small bowel contents were milked back into the stomach and the nasogastric tube was adjusted to evacuate.Abdomen was copiously irrigated. In light of the chronic nature of distended small bowel, the ischemic portions  of right colon that were removed, and the other colonic injuries, decision was made to bring out an  ileostomy and not do an anastomosis. Irrigation fluid returned clear. Hemostasis was ensured. Circular incision was made in the right lower quadrant and ileostomy. Subcutaneous fat was cored out with cautery and a cruciate incision was made in the fascia. The opening was enlarged to admit 2 fingers and distal ileum was brought out as an ileostomy. This was tacked to the fascia with 2-0 silk sutures. Remainder of the bowel was returned to anatomic position. Remaining omentum was brought down over the top. Fascia was closed with running #1 looped PDS, 2 lengths with one from each end and tied in the middle. Subcutaneous tissues were irrigated left open for sterile wet-to-dry dressing. Ileostomy was matured with interrupted 3-0 Vicryl sutures. Patient tolerated the procedure well without apparent complication and was taken recovery in stable condition with plan admission to intensive care unit. All counts were correct.  PATIENT DISPOSITION:  PACU - hemodynamically stable.   Delay start of Pharmacological VTE agent (>24hrs) due to surgical blood loss or risk of bleeding:  no  Georganna Skeans, MD, MPH, FACS Pager: [phone]  2/12/20153:55 PM

## 2013-09-29 NOTE — Progress Notes (Signed)
PT Cancellation/Discharge Note  Patient Details Name: [name] MRN: [id number] DOB: 12-27-1940   Cancelled Treatment:    Reason Eval/Treat Not Completed: Patient not medically ready; multiple days cancelled due to medical issues.  Noted now for surgery due to suspected perforation.  Please re-order when medically appropriate.  Thanks   Keanan Melander,CYNDI 09/29/2013, 3:30 PM

## 2013-09-29 NOTE — Progress Notes (Signed)
PT Cancellation Note  Patient Details Name: Nicholas [name] MRN: [id number] DOB: 1940/12/05   Cancelled Treatment:    Reason Eval/Treat Not Completed: Patient at procedure or test/unavailable; down for endoscopy procedure.  Will attempt later in PM if doesn't go today for fistula.   WYNN,CYNDI 09/29/2013, 10:33 AM

## 2013-09-30 ENCOUNTER — Inpatient Hospital Stay (HOSPITAL_COMMUNITY): Payer: Medicare Other

## 2013-09-30 ENCOUNTER — Encounter (HOSPITAL_COMMUNITY): Payer: Self-pay | Admitting: Gastroenterology

## 2013-09-30 DIAGNOSIS — Z9911 Dependence on respirator [ventilator] status: Secondary | ICD-10-CM

## 2013-09-30 DIAGNOSIS — I2510 Atherosclerotic heart disease of native coronary artery without angina pectoris: Secondary | ICD-10-CM

## 2013-09-30 LAB — GLUCOSE, CAPILLARY
GLUCOSE-CAPILLARY: 113 mg/dL — AB (ref 70–99)
GLUCOSE-CAPILLARY: 96 mg/dL (ref 70–99)
Glucose-Capillary: 93 mg/dL (ref 70–99)
Glucose-Capillary: 99 mg/dL (ref 70–99)

## 2013-09-30 LAB — CARBOXYHEMOGLOBIN
CARBOXYHEMOGLOBIN: 1 % (ref 0.5–1.5)
METHEMOGLOBIN: 1.1 % (ref 0.0–1.5)
O2 Saturation: 52.3 %
Total hemoglobin: 13 g/dL — ABNORMAL LOW (ref 13.5–18.0)

## 2013-09-30 MED ORDER — FENTANYL CITRATE 0.05 MG/ML IJ SOLN
25.0000 ug | INTRAMUSCULAR | Status: DC | PRN
  Administered 2013-09-30: 100 ug via INTRAVENOUS
  Administered 2013-09-30 (×2): 50 ug via INTRAVENOUS
  Administered 2013-09-30 – 2013-10-01 (×2): 100 ug via INTRAVENOUS
  Administered 2013-10-01 (×5): 50 ug via INTRAVENOUS
  Administered 2013-10-01 – 2013-10-02 (×7): 100 ug via INTRAVENOUS
  Filled 2013-09-30 (×17): qty 2

## 2013-09-30 MED ORDER — SODIUM CHLORIDE 0.9 % IV BOLUS (SEPSIS)
500.0000 mL | Freq: Once | INTRAVENOUS | Status: AC
  Administered 2013-09-30: 500 mL via INTRAVENOUS

## 2013-09-30 NOTE — Progress Notes (Signed)
[facility] Progress Note   Subjective: stable, alert, low dose pressors   Exam  Blood pressure 93/66, pulse 87, temperature 97.8 F (36.6 C), temperature source Oral, resp. rate 10, height 5\' 6"  (1.676 m), weight 66.9 kg (147 lb 7.8 oz), SpO2 100.00%. On vent, responsive ETT in place Clear lungs bilat RRR no MRG Abd firm, dec'd BS, ileostomy in place No LE edema R IJ cath in place, L AVF no bruit  Dialysis: [facility] 4h   69kg  2K//2.25Bath   Heparin 5000   LUE AVF (failed now, has R IJ cath)   Hectorol 2     Epo 5000     Venofer none Last labs: Hb 9.9 last pth 296   Assessment: 1 Bowel obstruction due to colon tumor 2 Perf colon s/p R colectomy / ileostomy 3 VDRF 4 ESRD, new R IJ cath, failed L arm AVF from inflow failure (occluded radial artery)  5 CDif on IV flagyl 6 CAD / afib- no AC due to hx GIB 7 Anemia Hb 11.4, darbe 100/wk 8 Recent L1 fx, nonsurgical Rx 9 HTN/volume- low BP's chronic on midodrine, also low on volume, CVP 2 > ok to give fluids and would wean pressors for SBP 90 given chronic low BP's  Plan- HD tomorrow    Kelly Splinter MD  pager [phone]    cell [phone]  09/30/2013, 10:15 AM     Recent Labs Lab 09/27/13 0500 09/28/13 0738 09/29/13 0508 09/29/13 2045  NA 140 139 139 134*  K 4.4 4.0 3.2* 3.2*  CL 97 98 97 97  CO2 23 20 23  18*  GLUCOSE 84 92 122* 193*  BUN 58* 44* 21 25*  CREATININE 6.29* 5.07* 3.01* 3.27*  CALCIUM 7.3* 7.3* 7.3* 7.1*  PHOS 5.4* 4.6  --   --     Recent Labs Lab 09/25/13 1621 09/27/13 0500 09/28/13 0738  AST 11  --   --   ALT 6  --   --   ALKPHOS 138*  --   --   BILITOT 0.5  --   --   PROT 5.7*  --   --   ALBUMIN 2.0* 1.7* 1.7*    Recent Labs Lab 09/25/13 1621  09/27/13 0500 09/29/13 0508 09/29/13 2045  WBC 14.1*  < > 16.6* 14.9* 31.6*  NEUTROABS 12.1*  --   --   --   --   HGB 10.4*  < > 10.4* 9.9* 11.4*  HCT 33.8*  < > 32.7* 31.8* 35.7*  MCV 96.0  < > 94.5 94.1 94.2  PLT 367  <  > 317 325 359  < > = values in this interval not displayed. [name] antiseptic oral rinse  1 application Mouth Rinse QID  . calcitonin (salmon)  1 spray Alternating Nares Daily  . chlorhexidine  15 mL Mouth/Throat BID  . darbepoetin  60 mcg Intravenous Q Mon-HD  . doxercalciferol  2 mcg Intravenous Q M,W,F-HD  . ertapenem  500 mg Intravenous Q24H  . insulin aspart  0-9 Units Subcutaneous 6 times per day  . metronidazole  500 mg Intravenous Q8H  . midodrine  5 mg Oral Q M,W,F-HD  . pantoprazole (PROTONIX) IV  40 mg Intravenous Q24H  . potassium chloride  10 mEq Intravenous Once  . sodium chloride  500 mL Intravenous Once   . sodium chloride 20 mL/hr at 09/29/13 2000  . dextrose 5 % and 0.2 % NaCl 65 mL/hr (09/28/13 1835)  . phenylephrine (NEO-SYNEPHRINE) Adult  infusion 70 mcg/min (09/30/13 0900)   acetaminophen, acetaminophen, fentaNYL, promethazine

## 2013-09-30 NOTE — Progress Notes (Addendum)
Name: Nicholas [name] MRN: [id number] DOB: 03/19/1941    ADMISSION DATE:  09/25/2013 CONSULTATION DATE:  09/30/2013  REFERRING MD :  Colonel Bald PRIMARY SERVICE: [facility]  CHIEF COMPLAINT:   Post op on vent s/p ex lap for SBO.   BRIEF PATIENT DESCRIPTION:  73 y.o. white male with end-stage renal disease status post exploratory laparotomy for small bowel obstruction.  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  The patient with right colectomy and repair of descending and sigmoid serosal tears and ileostomy placement.  The patient is on vasopressor support postop mechanical ventilation and critical care was consult  SIGNIFICANT EVENTS: 2/12 Rt colectomy, repair descending and sigmoid serosal tears, ileostomy  STUDIES:    LINES / TUBES: Rt IJ HD cath 2/12 >>  ETT 2/12 >>  Rt radial aline 2/12 >>  Lt IJ CVL 2/12 >>   CULTURES: none  ANTIBIOTICS: Flagyl 2/9 invanz 2/12  SUBJECTIVE:  Tolerating SBT.  Remains on pressors.  VITAL SIGNS: Temp:  [96.6 F (35.9 C)-99 F (37.2 C)] 97.8 F (36.6 C) (02/13 0736) Pulse Rate:  [69-107] 78 (02/13 0802) Resp:  [7-24] 12 (02/13 0802) BP: (69-164)/(51-94) 98/66 mmHg (02/13 0802) SpO2:  [81 %-100 %] 100 % (02/13 0802) FiO2 (%):  [40 %-50 %] 40 % (02/13 0802) Weight:  [147 lb 7.8 oz (66.9 kg)] 147 lb 7.8 oz (66.9 kg) (02/13 0500) HEMODYNAMICS: CVP:  [2 mmHg-5 mmHg] 3 mmHg VENTILATOR SETTINGS: Vent Mode:  [-] CPAP;PSV FiO2 (%):  [40 %-50 %] 40 % Set Rate:  [12 bmp] 12 bmp Vt Set:  [500 mL-600 mL] 600 mL PEEP:  [5 cmH20] 5 cmH20 Pressure Support:  [5 cmH20] 5 cmH20 Plateau Pressure:  [12 cmH20-14 cmH20] 14 cmH20 INTAKE / OUTPUT: Intake/Output     02/12 0701 - 02/13 0700 02/13 0701 - 02/14 0700   I.V. (mL/kg) 2414 (36.1) 63.8 (1)   NG/GT 90 30   IV Piggyback 500    Total Intake(mL/kg) 3004 (44.9) 93.8 (1.4)   Emesis/NG output 850    Other     Stool 50  50   Blood 200    Total Output 1100 50   Net +1904 +43.8          PHYSICAL EXAMINATION: General:  Ill-appearing elderly male orally intubated Neuro:  Awake and alert on low-dose fentanyl drip HEENT:  Dry mucous membranes Cardiovascular:  Regular rate and rhythm normal S1-S2 Lungs:  Distant breath sounds no wheeze or rhonchi Abdomen:  Postop abdomen Musculoskeletal:  Full range of motion Skin:  Clear  LABS:  CBC  Recent Labs Lab 09/27/13 0500 09/29/13 0508 09/29/13 2045  WBC 16.6* 14.9* 31.6*  HGB 10.4* 9.9* 11.4*  HCT 32.7* 31.8* 35.7*  PLT 317 325 359   Coag's  Recent Labs Lab 09/29/13 0508  INR 1.62*   BMET  Recent Labs Lab 09/28/13 0738 09/29/13 0508 09/29/13 2045  NA 139 139 134*  K 4.0 3.2* 3.2*  CL 98 97 97  CO2 20 23 18*  BUN 44* 21 25*  CREATININE 5.07* 3.01* 3.27*  GLUCOSE 92 122* 193*   Electrolytes  Recent Labs Lab 09/27/13 0500 09/28/13 0738 09/29/13 0508 09/29/13 2045  CALCIUM 7.3* 7.3* 7.3* 7.1*  PHOS 5.4* 4.6  --   --    Sepsis Markers  Recent Labs Lab 09/25/13 1634 09/29/13 2045  LATICACIDVEN 0.86 1.5   ABG  Recent Labs  Lab 09/29/13 1744  PHART 7.430  PCO2ART 29.6*  PO2ART 151.0*   Liver Enzymes  Recent Labs Lab 09/25/13 1621 09/27/13 0500 09/28/13 0738  AST 11  --   --   ALT 6  --   --   ALKPHOS 138*  --   --   BILITOT 0.5  --   --   ALBUMIN 2.0* 1.7* 1.7*   Glucose  Recent Labs Lab 09/29/13 0813 09/29/13 1329 09/29/13 2010 09/29/13 2332 09/30/13 0340 09/30/13 0740  GLUCAP 129* 100* 166* 126* 93 99    Imaging Ir Fluoro Guide Cv Line Right  09/28/2013   CLINICAL DATA:  Non functional left arm AV dialysis fistula, not amenable to percutaneous intervention. Dialysis access needed.  EXAM: TUNNELED HEMODIALYSIS CATHETER PLACEMENT WITH ULTRASOUND AND FLUOROSCOPIC GUIDANCE  TECHNIQUE: The procedure, risks, benefits, and alternatives were explained to the patient. Questions regarding the procedure  were encouraged and answered. The patient understands and consents to the procedure. As antibiotic prophylaxis, cefazolin 1 g was ordered pre-procedure and administered intravenously within one hour of incision.Patency of the right IJ vein was confirmed with ultrasound with image documentation. An appropriate skin site was determined. Region was prepped using maximum barrier technique including cap and mask, sterile gown, sterile gloves, large sterile sheet, and Chlorhexidine as cutaneous antisepsis. The region was infiltrated locally with 1% lidocaine.  Intravenous Fentanyl and Versed were administered as conscious sedation during continuous cardiorespiratory monitoring by the radiology RN, with a total moderate sedation time of 80 minutes.  Under real-time ultrasound guidance, the right IJ vein was accessed with a 21 gauge micropuncture needle; the needle tip within the vein was confirmed with ultrasound image documentation. Needle exchanged over the 018 guidewire for transitional dilator, which allowed advancement of a Benson wire into the IVC. Over this, an MPA catheter was advanced. A Hemosplit 19 hemodialysis catheter was tunneled from the right anterior chest wall approach to the right IJ dermatotomy site. The MPA catheter was exchanged over an Amplatz wire for serial vascular dilators which allow placement of a peel-away sheath, through which the catheter was advanced under intermittent fluoroscopy, positioned with its tips in the proximal and midright atrium. Spot chest radiograph confirms good catheter position. No pneumothorax. Catheter was flushed and primed per protocol. Catheter secured externally with O Prolene sutures. The right IJ dermatotomy site was closed with 3-0 Monocryl subcutaneous suture and covered with Dermabond. No immediate complication.  COMPARISON:  None  FLUOROSCOPY TIME:  10 min 12 seconds  ACCESS: Remains approachable for percutaneous intervention as needed.  IMPRESSION: 1.  Technically successful placement of tunneled right IJ hemodialysis catheter with ultrasound and fluoroscopic guidance. Ready for routine use.   Electronically Signed   By: Arne Cleveland M.D.   On: 09/28/2013 14:10   Ir Angio Av Shunt Addl Access  09/28/2013   CLINICAL DATA:  End stage renal disease, poor function of left arm dialysis fistula.  TECHNIQUE: DIALYSIS SHUNTOGRAM  The procedure, risks (including but not limited to bleeding, infection, organ damage ), benefits, and alternatives were explained to the patient. Questions regarding the procedure were encouraged and answered. The patient understands and consents to the procedure.  In preparation for possible declot procedure, the skin overlying the left forearm fistula was prepped with Betadine, draped in usual sterile fashion, infiltrated locally with 1% lidocaine.  Intravenous Fentanyl and Versed were administered as conscious sedation during continuous cardiorespiratory monitoring by the radiology RN, with a total moderate sedation time of eighty minutes.  The  outflow vein from the fistula was accessed just central to the fistula with a 21 gauge micropuncture needle under real-time ultrasound guidance. Needle was exchanged over a 018 guide wire for a 4 French transitional dilator. This was used for outflow venography.  The outflow vein was then accessed more centrally retrograde with a 21 gauge micropuncture needle under real-time ultrasound guidance. The needle was exchanged over the 018 wire for the transitional dilator, which allow placement of a Bentson wire. Over this, a 5 [place] Kumpe catheter was advanced and used to negotiate the arterial anastomosis for additional fistulography. Attempts were made to catheterize the proximal radial artery using a variety of catheter & guidewire combinations but these were ultimately unsuccessful. Assessment of the arterial anatomy of the forearm was performed with a blood pressure cuff at the upper arm level.   Ultimately, No declot procedure was needed. The access sites were removed and hemostasis achieved with manual compression.  No immediate complication.  COMPARISON:  None  FLUOROSCOPY TIME:  10 min 12 seconds  ACCESS: Proximal left radial artery occlusion. Not amenable to percutaneous intervention. Consider surgical consultation.  FINDINGS: The outflow cephalic vein from the fistula is widely patent. Central venous system through the SVC is patent. There is a competing outflow vein at the level of the midforearm. There is stenosis at the arterial anastomosis. The proximal radial artery is occluded, not opacified central to the level of the fistula. There is retrograde flow in the distal radial artery supplied by palmar arches, supplying low-grade flow across the fistula.  IMPRESSION: 1. Proximal left radial artery occlusion. Collateral flow via palmar arches reconstitutes the distal radial artery, with retrograde flow into the fistula, at an inadequate rate. This is not amenable to percutaneous intervention. After telephone consultation with Dr. Jonnie Finner, plan made to proceed with hemodialysis catheter placement.   Electronically Signed   By: Arne Cleveland M.D.   On: 09/28/2013 14:17   Ir US Guide Vasc Access Left  09/28/2013   CLINICAL DATA:  End stage renal disease, poor function of left arm dialysis fistula.  TECHNIQUE: DIALYSIS SHUNTOGRAM  The procedure, risks (including but not limited to bleeding, infection, organ damage ), benefits, and alternatives were explained to the patient. Questions regarding the procedure were encouraged and answered. The patient understands and consents to the procedure.  In preparation for possible declot procedure, the skin overlying the left forearm fistula was prepped with Betadine, draped in usual sterile fashion, infiltrated locally with 1% lidocaine.  Intravenous Fentanyl and Versed were administered as conscious sedation during continuous cardiorespiratory monitoring by  the radiology RN, with a total moderate sedation time of eighty minutes.  The outflow vein from the fistula was accessed just central to the fistula with a 21 gauge micropuncture needle under real-time ultrasound guidance. Needle was exchanged over a 018 guide wire for a 4 French transitional dilator. This was used for outflow venography.  The outflow vein was then accessed more centrally retrograde with a 21 gauge micropuncture needle under real-time ultrasound guidance. The needle was exchanged over the 018 wire for the transitional dilator, which allow placement of a Bentson wire. Over this, a 5 [place] Kumpe catheter was advanced and used to negotiate the arterial anastomosis for additional fistulography. Attempts were made to catheterize the proximal radial artery using a variety of catheter & guidewire combinations but these were ultimately unsuccessful. Assessment of the arterial anatomy of the forearm was performed with a blood pressure cuff at the upper arm level.  Ultimately,  No declot procedure was needed. The access sites were removed and hemostasis achieved with manual compression.  No immediate complication.  COMPARISON:  None  FLUOROSCOPY TIME:  10 min 12 seconds  ACCESS: Proximal left radial artery occlusion. Not amenable to percutaneous intervention. Consider surgical consultation.  FINDINGS: The outflow cephalic vein from the fistula is widely patent. Central venous system through the SVC is patent. There is a competing outflow vein at the level of the midforearm. There is stenosis at the arterial anastomosis. The proximal radial artery is occluded, not opacified central to the level of the fistula. There is retrograde flow in the distal radial artery supplied by palmar arches, supplying low-grade flow across the fistula.  IMPRESSION: 1. Proximal left radial artery occlusion. Collateral flow via palmar arches reconstitutes the distal radial artery, with retrograde flow into the fistula, at an  inadequate rate. This is not amenable to percutaneous intervention. After telephone consultation with Dr. Jonnie Finner, plan made to proceed with hemodialysis catheter placement.   Electronically Signed   By: Arne Cleveland M.D.   On: 09/28/2013 14:17   Ir US Guide Vasc Access Right  09/28/2013   CLINICAL DATA:  Non functional left arm AV dialysis fistula, not amenable to percutaneous intervention. Dialysis access needed.  EXAM: TUNNELED HEMODIALYSIS CATHETER PLACEMENT WITH ULTRASOUND AND FLUOROSCOPIC GUIDANCE  TECHNIQUE: The procedure, risks, benefits, and alternatives were explained to the patient. Questions regarding the procedure were encouraged and answered. The patient understands and consents to the procedure. As antibiotic prophylaxis, cefazolin 1 g was ordered pre-procedure and administered intravenously within one hour of incision.Patency of the right IJ vein was confirmed with ultrasound with image documentation. An appropriate skin site was determined. Region was prepped using maximum barrier technique including cap and mask, sterile gown, sterile gloves, large sterile sheet, and Chlorhexidine as cutaneous antisepsis. The region was infiltrated locally with 1% lidocaine.  Intravenous Fentanyl and Versed were administered as conscious sedation during continuous cardiorespiratory monitoring by the radiology RN, with a total moderate sedation time of 80 minutes.  Under real-time ultrasound guidance, the right IJ vein was accessed with a 21 gauge micropuncture needle; the needle tip within the vein was confirmed with ultrasound image documentation. Needle exchanged over the 018 guidewire for transitional dilator, which allowed advancement of a Benson wire into the IVC. Over this, an MPA catheter was advanced. A Hemosplit 19 hemodialysis catheter was tunneled from the right anterior chest wall approach to the right IJ dermatotomy site. The MPA catheter was exchanged over an Amplatz wire for serial vascular  dilators which allow placement of a peel-away sheath, through which the catheter was advanced under intermittent fluoroscopy, positioned with its tips in the proximal and midright atrium. Spot chest radiograph confirms good catheter position. No pneumothorax. Catheter was flushed and primed per protocol. Catheter secured externally with O Prolene sutures. The right IJ dermatotomy site was closed with 3-0 Monocryl subcutaneous suture and covered with Dermabond. No immediate complication.  COMPARISON:  None  FLUOROSCOPY TIME:  10 min 12 seconds  ACCESS: Remains approachable for percutaneous intervention as needed.  IMPRESSION: 1. Technically successful placement of tunneled right IJ hemodialysis catheter with ultrasound and fluoroscopic guidance. Ready for routine use.   Electronically Signed   By: Arne Cleveland M.D.   On: 09/28/2013 14:10   Dg Chest Port 1 View  09/30/2013   CLINICAL DATA:  Central line placement  EXAM: PORTABLE CHEST - 1 VIEW  COMPARISON:  Air radiograph 09/29/2013  FINDINGS: There has been interval  placement of a left IJ central venous catheter with tip overlying the cavoatrial junction. Tip of the endotracheal tube is positioned 3.6 cm above the carina. Right-sided hemodialysis catheter is unchanged.  Sequelae of prior CABG again noted.  Lungs are hypoinflated. Bilateral pleural effusions, left greater than right, are slightly improved relative to prior study. Associated bibasilar atelectasis is present. No new focal infiltrate. No overt pulmonary edema. No pneumothorax.  Osseous structures are unchanged.  IMPRESSION: 1. Tip of the left IJ central venous catheter overlying the cavoatrial junction. Remaining support apparatus as above. 2. Slight interval decrease in size of bilateral pleural effusions, left greater than right, with similar bibasilar atelectasis.   Electronically Signed   By: Jeannine Boga M.D.   On: 09/30/2013 03:23   Dg Chest Port 1 View  09/29/2013   CLINICAL DATA:   Status post right partial colectomy  EXAM: PORTABLE CHEST - 1 VIEW  COMPARISON:  02/17/2013  FINDINGS: Cardiac shadow remains mildly enlarged. A dialysis catheter is seen in satisfactory position. An endotracheal tube is now noted 3.8 cm above the carina. A nasogastric catheter extends into the stomach. Bilateral pleural effusions left greater than right are seen. The left effusion appears chronic in nature when compare with the previous exam from multiple months previous. No new focal abnormality is seen.  IMPRESSION: Tubes and lines as described.  Pleural effusions left greater than right. The left effusion appears chronic in nature.   Electronically Signed   By: Inez Catalina M.D.   On: 09/29/2013 18:00   Dg Abd 2 Views  09/28/2013   CLINICAL DATA:  Small bowel obstruction. Abdominal distention. NG tube placement.  EXAM: ABDOMEN - 2 VIEW  COMPARISON:  09/27/2013.  FINDINGS: Supine and left lateral decubitus views of the abdomen again demonstrate diffuse dilatation of the small bowel loops in the central abdomen measuring up to 4.3 cm in diameter. Multiple air-fluid levels are noted on the left lateral decubitus view. No frank pneumoperitoneum. Paucity of distal colonic gas, although there is a small amount of rectal gas present. Nasogastric tube appears coiled in the stomach. 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  IMPRESSION: 1. Bowel gas pattern remains compatible with a small bowel obstruction. 2. No pneumoperitoneum at this time. 3. Nasogastric tube remains coiled in the stomach.   Electronically Signed   By: Vinnie Langton M.D.   On: 09/28/2013 10:38   Dg Abd Portable 2v  09/29/2013   CLINICAL DATA:  Status post colonoscopy. Question free intraperitoneal air.  EXAM: PORTABLE ABDOMEN - 2 VIEW  COMPARISON:  CT abdomen and pelvis 09/25/2013.  FINDINGS: There is a large volume of free intraperitoneal air. NG tube is in place with  tip in the stomach.  IMPRESSION: Study is positive for free intraperitoneal air.  Critical Value/emergent results were called by telephone at the time of interpretation on 09/29/2013 at 12:27 PM to Dr. Ronald Lobo , who verbally acknowledged these results.   Electronically Signed   By: Inge Rise M.D.   On: 09/29/2013 12:30   Ir Shuntogram/ Fistulagram Left Mod Sed  09/28/2013   CLINICAL DATA:  End stage renal disease, poor function of left arm dialysis fistula.  TECHNIQUE: DIALYSIS SHUNTOGRAM  The procedure, risks (including but not limited to bleeding, infection, organ damage ), benefits, and alternatives were explained to the patient. Questions regarding the procedure were encouraged and answered. The patient understands and consents to the procedure.  In preparation  for possible declot procedure, the skin overlying the left forearm fistula was prepped with Betadine, draped in usual sterile fashion, infiltrated locally with 1% lidocaine.  Intravenous Fentanyl and Versed were administered as conscious sedation during continuous cardiorespiratory monitoring by the radiology RN, with a total moderate sedation time of eighty minutes.  The outflow vein from the fistula was accessed just central to the fistula with a 21 gauge micropuncture needle under real-time ultrasound guidance. Needle was exchanged over a 018 guide wire for a 4 French transitional dilator. This was used for outflow venography.  The outflow vein was then accessed more centrally retrograde with a 21 gauge micropuncture needle under real-time ultrasound guidance. The needle was exchanged over the 018 wire for the transitional dilator, which allow placement of a Bentson wire. Over this, a 5 [place] Kumpe catheter was advanced and used to negotiate the arterial anastomosis for additional fistulography. Attempts were made to catheterize the proximal radial artery using a variety of catheter & guidewire combinations but these were ultimately  unsuccessful. Assessment of the arterial anatomy of the forearm was performed with a blood pressure cuff at the upper arm level.  Ultimately, No declot procedure was needed. The access sites were removed and hemostasis achieved with manual compression.  No immediate complication.  COMPARISON:  None  FLUOROSCOPY TIME:  10 min 12 seconds  ACCESS: Proximal left radial artery occlusion. Not amenable to percutaneous intervention. Consider surgical consultation.  FINDINGS: The outflow cephalic vein from the fistula is widely patent. Central venous system through the SVC is patent. There is a competing outflow vein at the level of the midforearm. There is stenosis at the arterial anastomosis. The proximal radial artery is occluded, not opacified central to the level of the fistula. There is retrograde flow in the distal radial artery supplied by palmar arches, supplying low-grade flow across the fistula.  IMPRESSION: 1. Proximal left radial artery occlusion. Collateral flow via palmar arches reconstitutes the distal radial artery, with retrograde flow into the fistula, at an inadequate rate. This is not amenable to percutaneous intervention. After telephone consultation with Dr. Jonnie Finner, plan made to proceed with hemodialysis catheter placement.   Electronically Signed   By: Arne Cleveland M.D.   On: 09/28/2013 14:17    ASSESSMENT / PLAN:  PULMONARY A:  Expected ventilatory support status post exploratory laparotomy for colonic mass with obstruction and perforation. Reported hx of COPD. P:   -proceed with extubation 2/13 -f/u CXR -prn BD's  CARDIOVASCULAR A:  Septic shock from peritonitis and recent C diff. Hx of CAD, A fib. Chronic hypotension >> gets midodrine with HD. P:  -fluids to keep CVP 4 to 6 -wean pressors to keep SBP > 90, MAP > 60 -hold amiodarone, ASA  RENAL A:   End-stage renal disease >> still has residual urine in bladder. P:   -per renal -will need AV graft >> deferred until  more stable; VVS following -I/O cath prn for urine volume > 300 ml  GASTROINTESTINAL A:   Perforation of colon with colonic mass and colonic obstruction status post laparotomy. History of Clostridium difficile colitis. History of GI bleeding. Protein calorie malnutrition. P:   -post-op care, nutrition per [facility] -protonix for SUP  HEMATOLOGIC A:   Acute blood loss anemia after surgery. 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 P:  -f/u CBC -transfuse for Hb < 7 -SCD for DVT prevention -defer aranesp to renal  INFECTIOUS A:   Septic shock from peritonitis. Recent C diff. 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 Hx of Gout, osteoporosis. P:   -Sliding-scale insulin protocol -hold allopurinol, miacalcin  NEUROLOGIC A:  Postoperative pain. Hx of DM neuropathy, Anxiety/depression. P:   -prn fentanyl -hold neurotin, lidoderm patch  CC time 35 minutes.  Chesley Mires, MD [facility] 09/30/2013, 9:35 AM Pager:  [phone] After 3pm call: [phone]

## 2013-09-30 NOTE — Progress Notes (Signed)
Dr. Biagio Borg operative report reviewed, and his management is much appreciated.  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 Interestingly, biopsies of the ischemic-appearing area did not show pathologic changes.  We await the pathologic analysis of the resected colon specimen.  At this time, the patient is extubated, sitting up in a chair, alert and conversant, and actually smiling.  I will sign off his case at this time, but please call us if we can be of further assistance in this patient's care.  Cleotis Nipper, M.D. [phone]

## 2013-09-30 NOTE — Progress Notes (Signed)
152mL IV fentanyl from drip (discontinued) wasted in sink and flushed. Witnessed by Tamera Punt, RN.

## 2013-09-30 NOTE — Procedures (Signed)
Extubation Procedure Note  Patient Details:   Name: KEARY WATERSON DOB: 25-Dec-1940 MRN: [id number]   Airway Documentation:   + air leak around cuff  Evaluation  O2 sats: stable throughout Complications: No apparent complications Patient did tolerate procedure well. Bilateral Breath Sounds: Clear Suctioning: Airway Yes, pt able to speak, no stridor noted.  VSS, no distress noted.   Lenna Sciara 09/30/2013, 10:14 AM

## 2013-09-30 NOTE — Progress Notes (Addendum)
Patient ID: Nicholas [name], male   DOB: 1941/03/20, 73 y.o.   MRN: [id number] Ostomy duskier than this AM. Appliance removed and tube inserted with light to examine. Appears viable with superficial mucosal slough. Will follow. Georganna Skeans, MD, MPH, FACS Pager: [phone]

## 2013-09-30 NOTE — Procedures (Addendum)
Central Venous Catheter Insertion Procedure Note KETHAN PAPADOPOULOS [id number] 20-Mar-1941  Procedure: Insertion of Central Venous Catheter Indications: Assessment of intravascular volume, Drug and/or fluid administration and Frequent blood sampling  Procedure Details Consent: Risks of procedure as well as the alternatives and risks of each were explained to the (patient/caregiver).  Consent for procedure obtained. Time Out: Verified patient identification, verified procedure, site/side was marked, verified correct patient position, special equipment/implants available, medications/allergies/relevent history reviewed, required imaging and test results available.  Performed Real time [place] used to ID and cannulate the vessel  Maximum sterile technique was used including antiseptics, cap, gloves, gown, hand hygiene, mask and sheet. Skin prep: Chlorhexidine; local anesthetic administered A antimicrobial bonded/coated triple lumen catheter was placed in the left internal jugular vein using the Seldinger technique.  Evaluation Blood flow good Complications: No apparent complications Patient did tolerate procedure well. Chest X-ray ordered to verify placement.  CXR: pending.  BABCOCK,PETE 09/30/2013, 2:43 AM  Mariel Sleet

## 2013-09-30 NOTE — Progress Notes (Signed)
Ostomy with a few dark dots along edges but putting out great. Extubate this AM.  I also spoke with Dr. Halford Chessman and Dr. Cristina Gong at the bedside later this AM. Appreciate their help. Patient examined and I agree with the assessment and plan  Georganna Skeans, MD, MPH, FACS Pager: [phone]  09/30/2013 10:46 AM

## 2013-09-30 NOTE — Progress Notes (Signed)
Patient ID: Nicholas [name], male   DOB: [birth year], 73 y.o.   MRN: [id number]  Subjective: Awake on the vent.  BP soft.  Afebrile.  Ostomy putting out.   Objective:  Vital signs:  Filed Vitals:   09/30/13 0645 09/30/13 0700 09/30/13 0736 09/30/13 0802  BP:  83/59  98/66  Pulse: 73 74  78  Temp:   97.8 F (36.6 C)   TempSrc:   Oral   Resp: $Remo'13 13  12  'urzHC$ Height:      Weight:      SpO2: 100% 100%  100%    Last BM Date: 09/25/13  Intake/Output   Yesterday:  02/12 0701 - 02/13 0700 In: 3004 [I.V.:2414; NG/GT:90; IV Piggyback:500] Out: 1100 [Emesis/NG output:850; Stool:50; Blood:200] This shift:  Total I/O In: 30 [NG/GT:30] Out: -    Physical Exam: General: Pt awake/alert/ on vent.  NAD Chest: CTA CV:  Pulses intact.  Regular rhythm Abdomen: Soft.  Nondistended.  Appropriately tender.  Midline wound is clean, beefy red, dressing replaced.  There are dark spots on stoma, monitor for necrosis.  Stool in ostomy bag. Ext:  SCDs BLE.  No mjr edema.  No cyanosis   Problem List:   Principal Problem:   Septic shock(785.52) Active Problems:   ESRD (end stage renal disease) on dialysis   DM (diabetes mellitus), type 2 with renal complications   Ischemic cardiomyopathy   CAD (coronary artery disease)   Intractable low back pain   Systolic CHF   L1 vertebral fracture   Protein-calorie malnutrition, severe   Small bowel obstruction   HCAP (healthcare-associated pneumonia)   Hypokalemia   Severe sepsis(995.92)   Peritonitis    Results:   Labs: Results for orders placed during the hospital encounter of 09/25/13 (from the past 48 hour(s))  GLUCOSE, CAPILLARY     Status: Abnormal   Collection Time    09/28/13 12:30 PM      Result Value Ref Range   Glucose-Capillary 101 (*) 70 - 99 mg/dL  GLUCOSE, CAPILLARY     Status: None   Collection Time    09/28/13  7:04 PM      Result Value Ref Range   Glucose-Capillary 83  70 - 99 mg/dL  GLUCOSE, CAPILLARY     Status: None    Collection Time    09/28/13  8:11 PM      Result Value Ref Range   Glucose-Capillary 80  70 - 99 mg/dL  GLUCOSE, CAPILLARY     Status: Abnormal   Collection Time    09/29/13 12:09 AM      Result Value Ref Range   Glucose-Capillary 128 (*) 70 - 99 mg/dL  GLUCOSE, CAPILLARY     Status: Abnormal   Collection Time    09/29/13  4:19 AM      Result Value Ref Range   Glucose-Capillary 117 (*) 70 - 99 mg/dL  PROTIME-INR     Status: Abnormal   Collection Time    09/29/13  5:08 AM      Result Value Ref Range   Prothrombin Time 18.8 (*) 11.6 - 15.2 seconds   INR 1.62 (*) 0.00 - 4.96  BASIC METABOLIC PANEL     Status: Abnormal   Collection Time    09/29/13  5:08 AM      Result Value Ref Range   Sodium 139  137 - 147 mEq/L   Potassium 3.2 (*) 3.7 - 5.3 mEq/L   Comment: DELTA CHECK NOTED  Chloride 97  96 - 112 mEq/L   CO2 23  19 - 32 mEq/L   Glucose, Bld 122 (*) 70 - 99 mg/dL   BUN 21  6 - 23 mg/dL   Comment: DELTA CHECK NOTED   Creatinine, Ser 3.01 (*) 0.50 - 1.35 mg/dL   Comment: DELTA CHECK NOTED   Calcium 7.3 (*) 8.4 - 10.5 mg/dL   GFR calc non Af Amer 20 (*) >90 mL/min   GFR calc Af Amer 23 (*) >90 mL/min   Comment: (NOTE)     The eGFR has been calculated using the CKD EPI equation.     This calculation has not been validated in all clinical situations.     eGFR's persistently <90 mL/min signify possible Chronic Kidney     Disease.  CBC     Status: Abnormal   Collection Time    09/29/13  5:08 AM      Result Value Ref Range   WBC 14.9 (*) 4.0 - 10.5 K/uL   RBC 3.38 (*) 4.22 - 5.81 MIL/uL   Hemoglobin 9.9 (*) 13.0 - 17.0 g/dL   HCT 31.8 (*) 39.0 - 52.0 %   MCV 94.1  78.0 - 100.0 fL   MCH 29.3  26.0 - 34.0 pg   MCHC 31.1  30.0 - 36.0 g/dL   RDW 19.5 (*) 11.5 - 15.5 %   Platelets 325  150 - 400 K/uL  GLUCOSE, CAPILLARY     Status: Abnormal   Collection Time    09/29/13  8:13 AM      Result Value Ref Range   Glucose-Capillary 129 (*) 70 - 99 mg/dL  GLUCOSE, CAPILLARY      Status: Abnormal   Collection Time    09/29/13  1:29 PM      Result Value Ref Range   Glucose-Capillary 100 (*) 70 - 99 mg/dL  POCT I-STAT 3, BLOOD GAS (G3+)     Status: Abnormal   Collection Time    09/29/13  5:44 PM      Result Value Ref Range   pH, Arterial 7.430  7.350 - 7.450   pCO2 arterial 29.6 (*) 35.0 - 45.0 mmHg   pO2, Arterial 151.0 (*) 80.0 - 100.0 mmHg   Bicarbonate 19.7 (*) 20.0 - 24.0 mEq/L   TCO2 21  0 - 100 mmol/L   O2 Saturation 99.0     Acid-base deficit 4.0 (*) 0.0 - 2.0 mmol/L   Patient temperature 98.6 F     Collection site RADIAL, ALLEN'S TEST ACCEPTABLE     Drawn by Operator     Sample type ARTERIAL    GLUCOSE, CAPILLARY     Status: Abnormal   Collection Time    09/29/13  8:10 PM      Result Value Ref Range   Glucose-Capillary 166 (*) 70 - 99 mg/dL   Comment 1 Documented in Chart     Comment 2 Notify RN    CBC     Status: Abnormal   Collection Time    09/29/13  8:45 PM      Result Value Ref Range   WBC 31.6 (*) 4.0 - 10.5 K/uL   RBC 3.79 (*) 4.22 - 5.81 MIL/uL   Hemoglobin 11.4 (*) 13.0 - 17.0 g/dL   HCT 35.7 (*) 39.0 - 52.0 %   MCV 94.2  78.0 - 100.0 fL   MCH 30.1  26.0 - 34.0 pg   MCHC 31.9  30.0 - 36.0 g/dL   RDW  19.4 (*) 11.5 - 15.5 %   Platelets 359  150 - 400 K/uL  BASIC METABOLIC PANEL     Status: Abnormal   Collection Time    09/29/13  8:45 PM      Result Value Ref Range   Sodium 134 (*) 137 - 147 mEq/L   Potassium 3.2 (*) 3.7 - 5.3 mEq/L   Chloride 97  96 - 112 mEq/L   CO2 18 (*) 19 - 32 mEq/L   Glucose, Bld 193 (*) 70 - 99 mg/dL   BUN 25 (*) 6 - 23 mg/dL   Creatinine, Ser 3.27 (*) 0.50 - 1.35 mg/dL   Calcium 7.1 (*) 8.4 - 10.5 mg/dL   GFR calc non Af Amer 18 (*) >90 mL/min   GFR calc Af Amer 21 (*) >90 mL/min   Comment: (NOTE)     The eGFR has been calculated using the CKD EPI equation.     This calculation has not been validated in all clinical situations.     eGFR's persistently <90 mL/min signify possible Chronic Kidney      Disease.  LACTIC ACID, PLASMA     Status: None   Collection Time    09/29/13  8:45 PM      Result Value Ref Range   Lactic Acid, Venous 1.5  0.5 - 2.2 mmol/L  GLUCOSE, CAPILLARY     Status: Abnormal   Collection Time    09/29/13 11:32 PM      Result Value Ref Range   Glucose-Capillary 126 (*) 70 - 99 mg/dL   Comment 1 Documented in Chart     Comment 2 Notify RN    GLUCOSE, CAPILLARY     Status: None   Collection Time    09/30/13  3:40 AM      Result Value Ref Range   Glucose-Capillary 93  70 - 99 mg/dL   Comment 1 Documented in Chart     Comment 2 Notify RN    CARBOXYHEMOGLOBIN     Status: Abnormal   Collection Time    09/30/13  5:25 AM      Result Value Ref Range   Total hemoglobin 13.0 (*) 13.5 - 18.0 g/dL   O2 Saturation 52.3     Carboxyhemoglobin 1.0  0.5 - 1.5 %   Methemoglobin 1.1  0.0 - 1.5 %  GLUCOSE, CAPILLARY     Status: None   Collection Time    09/30/13  7:40 AM      Result Value Ref Range   Glucose-Capillary 99  70 - 99 mg/dL    Imaging / Studies: Ir Fluoro Guide Cv Line Right  09/28/2013   CLINICAL DATA:  Non functional left arm AV dialysis fistula, not amenable to percutaneous intervention. Dialysis access needed.  EXAM: TUNNELED HEMODIALYSIS CATHETER PLACEMENT WITH ULTRASOUND AND FLUOROSCOPIC GUIDANCE  TECHNIQUE: The procedure, risks, benefits, and alternatives were explained to the patient. Questions regarding the procedure were encouraged and answered. The patient understands and consents to the procedure. As antibiotic prophylaxis, cefazolin 1 g was ordered pre-procedure and administered intravenously within one hour of incision.Patency of the right IJ vein was confirmed with ultrasound with image documentation. An appropriate skin site was determined. Region was prepped using maximum barrier technique including cap and mask, sterile gown, sterile gloves, large sterile sheet, and Chlorhexidine as cutaneous antisepsis. The region was infiltrated locally  with 1% lidocaine.  Intravenous Fentanyl and Versed were administered as conscious sedation during continuous cardiorespiratory monitoring by the radiology RN,  with a total moderate sedation time of 80 minutes.  Under real-time ultrasound guidance, the right IJ vein was accessed with a 21 gauge micropuncture needle; the needle tip within the vein was confirmed with ultrasound image documentation. Needle exchanged over the 018 guidewire for transitional dilator, which allowed advancement of a Benson wire into the IVC. Over this, an MPA catheter was advanced. A Hemosplit 19 hemodialysis catheter was tunneled from the right anterior chest wall approach to the right IJ dermatotomy site. The MPA catheter was exchanged over an Amplatz wire for serial vascular dilators which allow placement of a peel-away sheath, through which the catheter was advanced under intermittent fluoroscopy, positioned with its tips in the proximal and midright atrium. Spot chest radiograph confirms good catheter position. No pneumothorax. Catheter was flushed and primed per protocol. Catheter secured externally with O Prolene sutures. The right IJ dermatotomy site was closed with 3-0 Monocryl subcutaneous suture and covered with Dermabond. No immediate complication.  COMPARISON:  None  FLUOROSCOPY TIME:  10 min 12 seconds  ACCESS: Remains approachable for percutaneous intervention as needed.  IMPRESSION: 1. Technically successful placement of tunneled right IJ hemodialysis catheter with ultrasound and fluoroscopic guidance. Ready for routine use.   Electronically Signed   By: Arne Cleveland M.D.   On: 09/28/2013 14:10   Ir Angio Av Shunt Addl Access  09/28/2013   CLINICAL DATA:  End stage renal disease, poor function of left arm dialysis fistula.  TECHNIQUE: DIALYSIS SHUNTOGRAM  The procedure, risks (including but not limited to bleeding, infection, organ damage ), benefits, and alternatives were explained to the patient. Questions regarding  the procedure were encouraged and answered. The patient understands and consents to the procedure.  In preparation for possible declot procedure, the skin overlying the left forearm fistula was prepped with Betadine, draped in usual sterile fashion, infiltrated locally with 1% lidocaine.  Intravenous Fentanyl and Versed were administered as conscious sedation during continuous cardiorespiratory monitoring by the radiology RN, with a total moderate sedation time of eighty minutes.  The outflow vein from the fistula was accessed just central to the fistula with a 21 gauge micropuncture needle under real-time ultrasound guidance. Needle was exchanged over a 018 guide wire for a 4 French transitional dilator. This was used for outflow venography.  The outflow vein was then accessed more centrally retrograde with a 21 gauge micropuncture needle under real-time ultrasound guidance. The needle was exchanged over the 018 wire for the transitional dilator, which allow placement of a Bentson wire. Over this, a 5 [place] Kumpe catheter was advanced and used to negotiate the arterial anastomosis for additional fistulography. Attempts were made to catheterize the proximal radial artery using a variety of catheter & guidewire combinations but these were ultimately unsuccessful. Assessment of the arterial anatomy of the forearm was performed with a blood pressure cuff at the upper arm level.  Ultimately, No declot procedure was needed. The access sites were removed and hemostasis achieved with manual compression.  No immediate complication.  COMPARISON:  None  FLUOROSCOPY TIME:  10 min 12 seconds  ACCESS: Proximal left radial artery occlusion. Not amenable to percutaneous intervention. Consider surgical consultation.  FINDINGS: The outflow cephalic vein from the fistula is widely patent. Central venous system through the SVC is patent. There is a competing outflow vein at the level of the midforearm. There is stenosis at the  arterial anastomosis. The proximal radial artery is occluded, not opacified central to the level of the fistula. There is retrograde flow in  the distal radial artery supplied by palmar arches, supplying low-grade flow across the fistula.  IMPRESSION: 1. Proximal left radial artery occlusion. Collateral flow via palmar arches reconstitutes the distal radial artery, with retrograde flow into the fistula, at an inadequate rate. This is not amenable to percutaneous intervention. After telephone consultation with Dr. Jonnie Finner, plan made to proceed with hemodialysis catheter placement.   Electronically Signed   By: Arne Cleveland M.D.   On: 09/28/2013 14:17   Ir US Guide Vasc Access Left  09/28/2013   CLINICAL DATA:  End stage renal disease, poor function of left arm dialysis fistula.  TECHNIQUE: DIALYSIS SHUNTOGRAM  The procedure, risks (including but not limited to bleeding, infection, organ damage ), benefits, and alternatives were explained to the patient. Questions regarding the procedure were encouraged and answered. The patient understands and consents to the procedure.  In preparation for possible declot procedure, the skin overlying the left forearm fistula was prepped with Betadine, draped in usual sterile fashion, infiltrated locally with 1% lidocaine.  Intravenous Fentanyl and Versed were administered as conscious sedation during continuous cardiorespiratory monitoring by the radiology RN, with a total moderate sedation time of eighty minutes.  The outflow vein from the fistula was accessed just central to the fistula with a 21 gauge micropuncture needle under real-time ultrasound guidance. Needle was exchanged over a 018 guide wire for a 4 French transitional dilator. This was used for outflow venography.  The outflow vein was then accessed more centrally retrograde with a 21 gauge micropuncture needle under real-time ultrasound guidance. The needle was exchanged over the 018 wire for the transitional  dilator, which allow placement of a Bentson wire. Over this, a 5 [place] Kumpe catheter was advanced and used to negotiate the arterial anastomosis for additional fistulography. Attempts were made to catheterize the proximal radial artery using a variety of catheter & guidewire combinations but these were ultimately unsuccessful. Assessment of the arterial anatomy of the forearm was performed with a blood pressure cuff at the upper arm level.  Ultimately, No declot procedure was needed. The access sites were removed and hemostasis achieved with manual compression.  No immediate complication.  COMPARISON:  None  FLUOROSCOPY TIME:  10 min 12 seconds  ACCESS: Proximal left radial artery occlusion. Not amenable to percutaneous intervention. Consider surgical consultation.  FINDINGS: The outflow cephalic vein from the fistula is widely patent. Central venous system through the SVC is patent. There is a competing outflow vein at the level of the midforearm. There is stenosis at the arterial anastomosis. The proximal radial artery is occluded, not opacified central to the level of the fistula. There is retrograde flow in the distal radial artery supplied by palmar arches, supplying low-grade flow across the fistula.  IMPRESSION: 1. Proximal left radial artery occlusion. Collateral flow via palmar arches reconstitutes the distal radial artery, with retrograde flow into the fistula, at an inadequate rate. This is not amenable to percutaneous intervention. After telephone consultation with Dr. Jonnie Finner, plan made to proceed with hemodialysis catheter placement.   Electronically Signed   By: Arne Cleveland M.D.   On: 09/28/2013 14:17   Ir US Guide Vasc Access Right  09/28/2013   CLINICAL DATA:  Non functional left arm AV dialysis fistula, not amenable to percutaneous intervention. Dialysis access needed.  EXAM: TUNNELED HEMODIALYSIS CATHETER PLACEMENT WITH ULTRASOUND AND FLUOROSCOPIC GUIDANCE  TECHNIQUE: The procedure,  risks, benefits, and alternatives were explained to the patient. Questions regarding the procedure were encouraged and answered. The patient understands  and consents to the procedure. As antibiotic prophylaxis, cefazolin 1 g was ordered pre-procedure and administered intravenously within one hour of incision.Patency of the right IJ vein was confirmed with ultrasound with image documentation. An appropriate skin site was determined. Region was prepped using maximum barrier technique including cap and mask, sterile gown, sterile gloves, large sterile sheet, and Chlorhexidine as cutaneous antisepsis. The region was infiltrated locally with 1% lidocaine.  Intravenous Fentanyl and Versed were administered as conscious sedation during continuous cardiorespiratory monitoring by the radiology RN, with a total moderate sedation time of 80 minutes.  Under real-time ultrasound guidance, the right IJ vein was accessed with a 21 gauge micropuncture needle; the needle tip within the vein was confirmed with ultrasound image documentation. Needle exchanged over the 018 guidewire for transitional dilator, which allowed advancement of a Benson wire into the IVC. Over this, an MPA catheter was advanced. A Hemosplit 19 hemodialysis catheter was tunneled from the right anterior chest wall approach to the right IJ dermatotomy site. The MPA catheter was exchanged over an Amplatz wire for serial vascular dilators which allow placement of a peel-away sheath, through which the catheter was advanced under intermittent fluoroscopy, positioned with its tips in the proximal and midright atrium. Spot chest radiograph confirms good catheter position. No pneumothorax. Catheter was flushed and primed per protocol. Catheter secured externally with O Prolene sutures. The right IJ dermatotomy site was closed with 3-0 Monocryl subcutaneous suture and covered with Dermabond. No immediate complication.  COMPARISON:  None  FLUOROSCOPY TIME:  10 min 12  seconds  ACCESS: Remains approachable for percutaneous intervention as needed.  IMPRESSION: 1. Technically successful placement of tunneled right IJ hemodialysis catheter with ultrasound and fluoroscopic guidance. Ready for routine use.   Electronically Signed   By: Oley Balm M.D.   On: 09/28/2013 14:10   Dg Chest Port 1 View  09/30/2013   CLINICAL DATA:  Central line placement  EXAM: PORTABLE CHEST - 1 VIEW  COMPARISON:  Air radiograph 09/29/2013  FINDINGS: There has been interval placement of a left IJ central venous catheter with tip overlying the cavoatrial junction. Tip of the endotracheal tube is positioned 3.6 cm above the carina. Right-sided hemodialysis catheter is unchanged.  Sequelae of prior CABG again noted.  Lungs are hypoinflated. Bilateral pleural effusions, left greater than right, are slightly improved relative to prior study. Associated bibasilar atelectasis is present. No new focal infiltrate. No overt pulmonary edema. No pneumothorax.  Osseous structures are unchanged.  IMPRESSION: 1. Tip of the left IJ central venous catheter overlying the cavoatrial junction. Remaining support apparatus as above. 2. Slight interval decrease in size of bilateral pleural effusions, left greater than right, with similar bibasilar atelectasis.   Electronically Signed   By: Rise Mu M.D.   On: 09/30/2013 03:23   Dg Chest Port 1 View  09/29/2013   CLINICAL DATA:  Status post right partial colectomy  EXAM: PORTABLE CHEST - 1 VIEW  COMPARISON:  02/17/2013  FINDINGS: Cardiac shadow remains mildly enlarged. A dialysis catheter is seen in satisfactory position. An endotracheal tube is now noted 3.8 cm above the carina. A nasogastric catheter extends into the stomach. Bilateral pleural effusions left greater than right are seen. The left effusion appears chronic in nature when compare with the previous exam from multiple months previous. No new focal abnormality is seen.  IMPRESSION: Tubes and  lines as described.  Pleural effusions left greater than right. The left effusion appears chronic in nature.   Electronically Signed  By: Inez Catalina M.D.   On: 09/29/2013 18:00   Dg Abd 2 Views  09/28/2013   CLINICAL DATA:  Small bowel obstruction. Abdominal distention. NG tube placement.  EXAM: ABDOMEN - 2 VIEW  COMPARISON:  09/27/2013.  FINDINGS: Supine and left lateral decubitus views of the abdomen again demonstrate diffuse dilatation of the small bowel loops in the central abdomen measuring up to 4.3 cm in diameter. Multiple air-fluid levels are noted on the left lateral decubitus view. No frank pneumoperitoneum. Paucity of distal colonic gas, although there is a small amount of rectal gas present. Nasogastric tube appears coiled in the stomach. 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  IMPRESSION: 1. Bowel gas pattern remains compatible with a small bowel obstruction. 2. No pneumoperitoneum at this time. 3. Nasogastric tube remains coiled in the stomach.   Electronically Signed   By: Vinnie Langton M.D.   On: 09/28/2013 10:38   Dg Abd Portable 2v  09/29/2013   CLINICAL DATA:  Status post colonoscopy. Question free intraperitoneal air.  EXAM: PORTABLE ABDOMEN - 2 VIEW  COMPARISON:  CT abdomen and pelvis 09/25/2013.  FINDINGS: There is a large volume of free intraperitoneal air. NG tube is in place with tip in the stomach.  IMPRESSION: Study is positive for free intraperitoneal air.  Critical Value/emergent results were called by telephone at the time of interpretation on 09/29/2013 at 12:27 PM to Dr. Ronald Lobo , who verbally acknowledged these results.   Electronically Signed   By: Inge Rise M.D.   On: 09/29/2013 12:30   Ir Shuntogram/ Fistulagram Left Mod Sed  09/28/2013   CLINICAL DATA:  End stage renal disease, poor function of left arm dialysis fistula.  TECHNIQUE: DIALYSIS SHUNTOGRAM  The procedure, risks  (including but not limited to bleeding, infection, organ damage ), benefits, and alternatives were explained to the patient. Questions regarding the procedure were encouraged and answered. The patient understands and consents to the procedure.  In preparation for possible declot procedure, the skin overlying the left forearm fistula was prepped with Betadine, draped in usual sterile fashion, infiltrated locally with 1% lidocaine.  Intravenous Fentanyl and Versed were administered as conscious sedation during continuous cardiorespiratory monitoring by the radiology RN, with a total moderate sedation time of eighty minutes.  The outflow vein from the fistula was accessed just central to the fistula with a 21 gauge micropuncture needle under real-time ultrasound guidance. Needle was exchanged over a 018 guide wire for a 4 French transitional dilator. This was used for outflow venography.  The outflow vein was then accessed more centrally retrograde with a 21 gauge micropuncture needle under real-time ultrasound guidance. The needle was exchanged over the 018 wire for the transitional dilator, which allow placement of a Bentson wire. Over this, a 5 [place] Kumpe catheter was advanced and used to negotiate the arterial anastomosis for additional fistulography. Attempts were made to catheterize the proximal radial artery using a variety of catheter & guidewire combinations but these were ultimately unsuccessful. Assessment of the arterial anatomy of the forearm was performed with a blood pressure cuff at the upper arm level.  Ultimately, No declot procedure was needed. The access sites were removed and hemostasis achieved with manual compression.  No immediate complication.  COMPARISON:  None  FLUOROSCOPY TIME:  10 min 12 seconds  ACCESS: Proximal left radial artery occlusion. Not amenable to percutaneous intervention. Consider surgical consultation.  FINDINGS: The outflow cephalic vein from the  fistula is widely patent.  Central venous system through the SVC is patent. There is a competing outflow vein at the level of the midforearm. There is stenosis at the arterial anastomosis. The proximal radial artery is occluded, not opacified central to the level of the fistula. There is retrograde flow in the distal radial artery supplied by palmar arches, supplying low-grade flow across the fistula.  IMPRESSION: 1. Proximal left radial artery occlusion. Collateral flow via palmar arches reconstitutes the distal radial artery, with retrograde flow into the fistula, at an inadequate rate. This is not amenable to percutaneous intervention. After telephone consultation with Dr. Jonnie Finner, plan made to proceed with hemodialysis catheter placement.   Electronically Signed   By: Arne Cleveland M.D.   On: 09/28/2013 14:17    Medications / Allergies: per chart  Antibiotics: Anti-infectives   Start     Dose/Rate Route Frequency Ordered Stop   09/30/13 1400  ertapenem (INVANZ) 0.5 g in sodium chloride 0.9 % 50 mL IVPB     500 mg 100 mL/hr over 30 Minutes Intravenous Every 24 hours 09/29/13 2202     09/29/13 1415  ertapenem (INVANZ) 1 g in sodium chloride 0.9 % 50 mL IVPB     1 g 100 mL/hr over 30 Minutes Intravenous  Once 09/29/13 1159 09/29/13 1412   09/29/13 0600  cefUROXime (ZINACEF) 1.5 g in dextrose 5 % 50 mL IVPB  Status:  Discontinued     1.5 g 100 mL/hr over 30 Minutes Intravenous On call to O.R. 09/28/13 1711 09/29/13 2203   09/28/13 1200  ceFAZolin (ANCEF) IVPB 2 g/50 mL premix    Comments:  Tube to 17   2 g 100 mL/hr over 30 Minutes Intravenous  Once 09/28/13 1155 09/28/13 1246   09/27/13 1200  vancomycin (VANCOCIN) IVPB 1000 mg/200 mL premix  Status:  Discontinued     1,000 mg 200 mL/hr over 60 Minutes Intravenous Every M-W-F (Hemodialysis) 09/26/13 1638 09/26/13 1721   09/26/13 1745  metroNIDAZOLE (FLAGYL) IVPB 500 mg     500 mg 100 mL/hr over 60 Minutes Intravenous Every 8 hours 09/26/13 1722     09/26/13 1200   vancomycin (VANCOCIN) IVPB 750 mg/150 ml premix  Status:  Discontinued     750 mg 150 mL/hr over 60 Minutes Intravenous Every M-W-F (Hemodialysis) 09/25/13 2344 09/26/13 1051   09/26/13 1200  vancomycin (VANCOCIN) IVPB 1000 mg/200 mL premix  Status:  Discontinued     1,000 mg 200 mL/hr over 60 Minutes Intravenous Every M-W-F (Hemodialysis) 09/26/13 1052 09/26/13 1638   09/26/13 0600  piperacillin-tazobactam (ZOSYN) IVPB 2.25 g  Status:  Discontinued     2.25 g 100 mL/hr over 30 Minutes Intravenous 3 times per day 09/25/13 2126 09/26/13 1721   09/26/13 0000  vancomycin (VANCOCIN) 500 mg in sodium chloride irrigation 0.9 % 100 mL ENEMA  Status:  Discontinued     500 mg Rectal 4 times per day 09/25/13 2337 09/26/13 1722   09/25/13 2130  vancomycin (VANCOCIN) IVPB 750 mg/150 ml premix     750 mg 150 mL/hr over 60 Minutes Intravenous  Once 09/25/13 2124 09/26/13 0145   09/25/13 2000  piperacillin-tazobactam (ZOSYN) IVPB 3.375 g     3.375 g 100 mL/hr over 30 Minutes Intravenous  Once 09/25/13 1853 09/25/13 2002   09/25/13 2000  vancomycin (VANCOCIN) IVPB 1000 mg/200 mL premix     1,000 mg 200 mL/hr over 60 Minutes Intravenous  Once 09/25/13 1859 09/25/13 2113  Assessment/Plan  Recent back fx  ESRD on HD  CAD  DM   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 Grandville Silos 09/29/13 POD#1 Presume he will be extubated today.  Await CCM recommendation.  Appreciate consult -continue NGT, bowel rest -continue antibiotics. Indication: perforated bowel.  invanz D#1 Flagyl D#4 -await pathology results -monitor stoma -repeat labs in AM  Erby Pian, [facility] Surgery Pager [phone] Office [phone]  09/30/2013  8:50 AM

## 2013-09-30 NOTE — Progress Notes (Signed)
NUTRITION FOLLOW UP  DOCUMENTATION CODES Per approved criteria  -Severe malnutrition in the context of chronic illness   INTERVENTION:  If unable to advance to PO diet in next 24 hours, recommend initiation of nutrition support  RD to follow for nutrition care plan  NUTRITION DIAGNOSIS: Inadequate oral intake now related to altered GI function as evidenced by NPO status, ongoing  Goal: Pt to meet >/= 90% of their estimated nutrition needs, unmet  Monitor:  Nutrition support initiation vs PO diet advancement, weight, labs, I/O's  ASSESSMENT: PMHx is significant for ESRD (MWF), CAD s/p CABG, DM, HTN, afib, COPD. Recently discharged from [facility] about 2 weeks ago 2/2 L1 compression fx was discharged to SNF. Pt was also dx with C diff during that time. Admitted now with constipation x 3-4 days, also with n/v every time he tries to eat. Work-up reveals ?c diff colitis and HCAP.  Patient transferred to 2S-Surgical ICU from 6E-Renal 2/12.  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  Remains on pressors.  Nephrology following.  Plan for HD tomorrow.  NGT in place to LIS.  Patient has been NPO x 6 days.  If unable to advance to PO diet within next 24 hours, recommend nutrition support initiation (EN vs TPN).  Height: Ht Readings from Last 1 Encounters:  09/27/13 5\' 6"  (1.676 m)    Weight: Wt Readings from Last 1 Encounters:  09/30/13 147 lb 7.8 oz (66.9 kg)    Estimated Nutritional Needs: Kcal: 2000-2200 Protein: 110-120 gm Fluid: per MD  Skin: stage II pressure ulcer to sacrum  Diet Order: NPO    Intake/Output Summary (Last 24 hours) at 09/30/13 1325 Last data filed at 09/30/13 1300  Gross per 24 hour  Intake 3624.74 ml  Output    850 ml  Net 2774.74 ml     Labs:   Recent Labs Lab 09/27/13 0500 09/28/13 0738 09/29/13 0508 09/29/13 2045  NA 140 139 139 134*  K 4.4 4.0 3.2* 3.2*  CL 97 98 97 97  CO2 23 20 23  18*  BUN 58* 44* 21 25*  CREATININE 6.29* 5.07* 3.01* 3.27*  CALCIUM 7.3* 7.3* 7.3* 7.1*  PHOS 5.4* 4.6  --   --   GLUCOSE 84 92 122* 193*    CBG (last 3)   Recent Labs  09/30/13 0340 09/30/13 0740 09/30/13 1154  GLUCAP 93 99 113*    Scheduled Meds: . antiseptic oral rinse  1 application Mouth Rinse QID  . calcitonin (salmon)  1 spray Alternating Nares Daily  . chlorhexidine  15 mL Mouth/Throat BID  . darbepoetin  60 mcg Intravenous Q Mon-HD  . doxercalciferol  2 mcg Intravenous Q M,W,F-HD  . ertapenem  500 mg Intravenous Q24H  . insulin aspart  0-9 Units Subcutaneous 6 times per day  . metronidazole  500 mg Intravenous Q8H  . midodrine  5 mg Oral Q M,W,F-HD  . pantoprazole (PROTONIX) IV  40 mg Intravenous Q24H  . potassium chloride  10 mEq Intravenous Once  . sodium chloride  500 mL Intravenous Once    Continuous Infusions: . sodium chloride 20 mL/hr at 09/29/13 2000  . dextrose 5 % and 0.2 % NaCl 65 mL/hr (09/28/13 1835)  . phenylephrine (NEO-SYNEPHRINE) Adult infusion 55 mcg/min (09/30/13 1200)    Past Medical  History  Diagnosis Date  . Coronary artery disease     a. hx of MI 1992; b. 10/2012 NSTEMI (no cath 2/2 GIB);  c. 11/2012 MV: Inf defect w inf and septal HK (favor prior MI), EF 47%;  d. 12/2012 Cath: Severe 3vd, EF 45-50%;  e. 01/2013 CABG x 5 (LIMA->LAD, VG->D1, VG->OM1->OM2, VG->PDA)  . Diabetes mellitus   . Hypertension   . Arthritis   . Carotid artery disease     a. 01/2013 doppler: >80% RICA stenosis, <01% LICA stenosis;  b. 02/7938 R CEA.  . Gastritis 2012 and 09/2011    treated for H Pylori in 12/2011  . Diabetic neuropathy   . Osteoporosis   . COPD (chronic obstructive pulmonary disease)   . Anxiety   . Depression   . ESRD (end stage renal disease)     M-W-Sa  [name]  .  Diabetic nephropathy   . GERD (gastroesophageal reflux disease)   . Neuromuscular disorder     peripheral neurotherapy  . Anemia   . A-fib     a. Dx 10/2012, on amio, no anticoagulation 2/2 GIB 10/2012.  [name] GIB (gastrointestinal bleeding)     a. 10/2012, awaiting colonoscopy.    Past Surgical History  Procedure Laterality Date  . Coronary stent placement    . Hip surgery      both hips  . Av fistula placement      Left arm  . Cardiac catheterization    . Joint replacement Bilateral   . Eye surgery Bilateral     cararacts  . Coronary artery bypass graft N/A 02/03/2013    Procedure: CORONARY ARTERY BYPASS GRAFTING (CABG);  Surgeon: Melrose Nakayama, MD;  Location: [place];  Service: Open Heart Surgery;  Laterality: N/A;  . Endarterectomy N/A 02/03/2013    Procedure: ENDARTERECTOMY CAROTID;  Surgeon: Mal Misty, MD;  Location: [place];  Service: Vascular;  Laterality: N/A;  . Olecranon bursectomy Right 07/28/2013    Procedure: RIGHT ELBOW OLECRANON BURSECTOMY ADVANCEMENT CLOSURE ;  Surgeon: Linna Hoff, MD;  Location: [place];  Service: Orthopedics;  Laterality: Right;  . Colonoscopy N/A 09/29/2013    Procedure: COLONOSCOPY;  Surgeon: Cleotis Nipper, MD;  Location: [facility] ENDOSCOPY;  Service: Endoscopy;  Laterality: N/A;  CO2  ultra-slim scope if available  . Partial colectomy Right 09/29/2013    Procedure: PARTIAL COLECTOMY;  Surgeon: Zenovia Jarred, MD;  Location: [place];  Service: General;  Laterality: Right;  . Colostomy N/A 09/29/2013    Procedure: COLOSTOMY;  Surgeon: Zenovia Jarred, MD;  Location: [place];  Service: General;  Laterality: N/A;    Arthur Holms, RD, LDN Pager #: [phone] After-Hours Pager #: [phone]

## 2013-10-01 ENCOUNTER — Inpatient Hospital Stay (HOSPITAL_COMMUNITY): Payer: Medicare Other

## 2013-10-01 LAB — CBC
HEMATOCRIT: 28.1 % — AB (ref 39.0–52.0)
Hemoglobin: 9.1 g/dL — ABNORMAL LOW (ref 13.0–17.0)
MCH: 30.5 pg (ref 26.0–34.0)
MCHC: 32.4 g/dL (ref 30.0–36.0)
MCV: 94.3 fL (ref 78.0–100.0)
Platelets: 254 10*3/uL (ref 150–400)
RBC: 2.98 MIL/uL — ABNORMAL LOW (ref 4.22–5.81)
RDW: 20.1 % — ABNORMAL HIGH (ref 11.5–15.5)
WBC: 23.3 10*3/uL — AB (ref 4.0–10.5)

## 2013-10-01 LAB — GLUCOSE, CAPILLARY
GLUCOSE-CAPILLARY: 80 mg/dL (ref 70–99)
Glucose-Capillary: 103 mg/dL — ABNORMAL HIGH (ref 70–99)
Glucose-Capillary: 112 mg/dL — ABNORMAL HIGH (ref 70–99)
Glucose-Capillary: 102 mg/dL — ABNORMAL HIGH (ref 70–99)
Glucose-Capillary: 108 mg/dL — ABNORMAL HIGH (ref 70–99)
Glucose-Capillary: 87 mg/dL (ref 70–99)
Glucose-Capillary: 77 mg/dL (ref 70–99)

## 2013-10-01 LAB — BASIC METABOLIC PANEL
BUN: 34 mg/dL — ABNORMAL HIGH (ref 6–23)
CHLORIDE: 100 meq/L (ref 96–112)
CO2: 20 mEq/L (ref 19–32)
Calcium: 6.9 mg/dL — ABNORMAL LOW (ref 8.4–10.5)
Creatinine, Ser: 3.95 mg/dL — ABNORMAL HIGH (ref 0.50–1.35)
GFR calc non Af Amer: 14 mL/min — ABNORMAL LOW (ref >90)
GFR, EST AFRICAN AMERICAN: 16 mL/min — AB (ref >90)
Glucose, Bld: 109 mg/dL — ABNORMAL HIGH (ref 70–99)
POTASSIUM: 4.2 meq/L (ref 3.7–5.3)
Sodium: 135 mEq/L — ABNORMAL LOW (ref 137–147)

## 2013-10-01 MED ORDER — MIDODRINE HCL 5 MG PO TABS
5.0000 mg | ORAL_TABLET | Freq: Once | ORAL | Status: DC
  Filled 2013-10-01: qty 1

## 2013-10-01 MED ORDER — LIDOCAINE HCL (PF) 1 % IJ SOLN: 5.0000 mL | INTRAMUSCULAR | Status: DC | PRN

## 2013-10-01 MED ORDER — SODIUM CHLORIDE 0.9 % IV SOLN: 100.0000 mL | INTRAVENOUS | Status: DC | PRN

## 2013-10-01 MED ORDER — LIDOCAINE-PRILOCAINE 2.5-2.5 % EX CREA
1.0000 "application " | TOPICAL_CREAM | CUTANEOUS | Status: DC | PRN
  Filled 2013-10-01: qty 5

## 2013-10-01 MED ORDER — ALTEPLASE 2 MG IJ SOLR
2.0000 mg | Freq: Once | INTRAMUSCULAR | Status: AC | PRN
  Filled 2013-10-01: qty 2

## 2013-10-01 MED ORDER — LIDOCAINE 5 % EX PTCH
1.0000 | MEDICATED_PATCH | CUTANEOUS | Status: DC
  Administered 2013-10-01 – 2013-10-11 (×10): 1 via TRANSDERMAL
  Filled 2013-10-01 (×13): qty 1

## 2013-10-01 MED ORDER — HEPARIN SODIUM (PORCINE) 1000 UNIT/ML DIALYSIS
1000.0000 [IU] | INTRAMUSCULAR | Status: DC | PRN
Start: 2013-10-01 — End: 2013-10-03
  Filled 2013-10-01: qty 1

## 2013-10-01 MED ORDER — PENTAFLUOROPROP-TETRAFLUOROETH EX AERO: 1.0000 "application " | INHALATION_SPRAY | CUTANEOUS | Status: DC | PRN

## 2013-10-01 MED ORDER — NEPRO/CARBSTEADY PO LIQD
237.0000 mL | ORAL | Status: DC | PRN
  Filled 2013-10-01: qty 237

## 2013-10-01 NOTE — Progress Notes (Signed)
Dr. Jonnie Finner called and informed of Pts HR increasing and and BP soft during hemodialysis Tx. Informed that he breifly increased his HR up to 130. Dr. Barkley Bruns states to give 250cc NS boluses as needed up to 3 doses.

## 2013-10-01 NOTE — Progress Notes (Signed)
MD Zubelivitsky made aware of radiology report and recommendations.  Order for portable abdominal x-ray received.  Will continue to monitor.

## 2013-10-01 NOTE — Progress Notes (Signed)
PULMONARY / CRITICAL CARE MEDICINE  Name: Nicholas [name] MRN: [id number] DOB: 12-18-1940    ADMISSION DATE:  09/25/2013 CONSULTATION DATE:  10/01/2013  REFERRING MD :  CCS PRIMARY SERVICE: CCS  CHIEF COMPLAINT:  Post op on vent s/p ex lap for SBO.   BRIEF PATIENT DESCRIPTION: 73 yo white male with end-stage renal disease status post exploratory laparotomy for small bowel obstruction.  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  The patient with right colectomy and repair of descending and sigmoid serosal tears and ileostomy placement.  The patient is on vasopressor support postop mechanical ventilation and critical care was consult  SIGNIFICANT EVENTS: 2/12 Rt colectomy, repair descending and sigmoid serosal tears, ileostomy  STUDIES:   LINES / TUBES: Rt IJ HD cath 2/12 >>  ETT 2/12 >> 2/13 Rt radial aline 2/12 >>  Lt IJ CVL 2/12 >>   CULTURES:  ANTIBIOTICS: Flagyl 2/9 invanz 2/12  SUBJECTIVE: Remained extubated overnight. Complains of back pain.  VITAL SIGNS: Temp:  [96.8 F (36 C)-97.8 F (36.6 C)] 97.8 F (36.6 C) (02/14 1514) Pulse Rate:  [74-131] 110 (02/14 1530) Resp:  [9-25] 14 (02/14 1530) BP: (86-128)/(53-91) 100/57 mmHg (02/14 1530) SpO2:  [90 %-99 %] 94 % (02/14 1530) Weight:  [71.2 kg (156 lb 15.5 oz)] 71.2 kg (156 lb 15.5 oz) (02/14 1130) HEMODYNAMICS: CVP:  [2 mmHg-5 mmHg] 4 mmHg VENTILATOR SETTINGS:   INTAKE / OUTPUT: Intake/Output     02/13 0701 - 02/14 0700 02/14 0701 - 02/15 0700   I.V. (mL/kg) 928.1 (13.9) 173.3 (2.4)   NG/GT 210 60   IV Piggyback 850 100   Total Intake(mL/kg) 1988.1 (29.7) 333.3 (4.7)   Urine (mL/kg/hr) 400 (0.2)    Emesis/NG output 250 (0.2)    Other  -700 (-1.1)   Stool 125 (0.1)    Blood     Total Output 775 -700   Net +1213.1 +1033.3          PHYSICAL EXAMINATION: General:  Appears chronically ill, in no distress Neuro:  Awake and  alert HEENT:  Dry mucous membranes, poor dentition, NGT Cardiovascular:  Regular rate and rhythm Lungs:  Bilateral diminished air entry Abdomen:  Postop abdomen Musculoskeletal:  Full range of motion Skin:  Clear  LABS:  CBC  Recent Labs Lab 09/29/13 0508 09/29/13 2045 10/01/13 0400  WBC 14.9* 31.6* 23.3*  HGB 9.9* 11.4* 9.1*  HCT 31.8* 35.7* 28.1*  PLT 325 359 254   Coag's  Recent Labs Lab 09/29/13 0508  INR 1.62*   BMET  Recent Labs Lab 09/29/13 0508 09/29/13 2045 10/01/13 0400  NA 139 134* 135*  K 3.2* 3.2* 4.2  CL 97 97 100  CO2 23 18* 20  BUN 21 25* 34*  CREATININE 3.01* 3.27* 3.95*  GLUCOSE 122* 193* 109*   Electrolytes  Recent Labs Lab 09/27/13 0500 09/28/13 0738 09/29/13 0508 09/29/13 2045 10/01/13 0400  CALCIUM 7.3* 7.3* 7.3* 7.1* 6.9*  PHOS 5.4* 4.6  --   --   --    Sepsis Markers  Recent Labs Lab 09/25/13 1634 09/29/13 2045  LATICACIDVEN 0.86 1.5   ABG  Recent Labs Lab 09/29/13 1744  PHART 7.430  PCO2ART 29.6*  PO2ART 151.0*   Liver Enzymes  Recent Labs Lab 09/25/13 1621 09/27/13 0500 09/28/13 0738  AST 11  --   --   ALT 6  --   --   ALKPHOS 138*  --   --  BILITOT 0.5  --   --   ALBUMIN 2.0* 1.7* 1.7*   Glucose  Recent Labs Lab 09/30/13 1624 09/30/13 2008 10/01/13 0031 10/01/13 0407 10/01/13 0803 10/01/13 1212  GLUCAP 96 103* 112* 102* 108* 87    Imaging Dg Chest Port 1 View  10/01/2013   CLINICAL DATA:  Atelectasis.  EXAM: PORTABLE CHEST - 1 VIEW  COMPARISON:  Chest radiograph 09/30/2013.  FINDINGS: Right-sided hemodialysis catheter unchanged. Left-sided central venous catheter tip projects over the superior cavoatrial junction. Interval extubation. NG tube tip and side-port project over the left upper quadrant. Stable cardiac and mediastinal contours. Small bilateral pleural effusions with underlying opacities likely representing atelectasis.  IMPRESSION: Marked gaseous distention of the visualized  upper abdominal bowel loops, recommend correlation with dedicated abdominal radiography.  Interval extubation.  Otherwise stable support apparatus.  Small bilateral pleural effusions.  Underlying atelectasis.   Electronically Signed   By: Lovey Newcomer M.D.   On: 10/01/2013 08:08   Dg Chest Port 1 View  09/30/2013   CLINICAL DATA:  Central line placement  EXAM: PORTABLE CHEST - 1 VIEW  COMPARISON:  Air radiograph 09/29/2013  FINDINGS: There has been interval placement of a left IJ central venous catheter with tip overlying the cavoatrial junction. Tip of the endotracheal tube is positioned 3.6 cm above the carina. Right-sided hemodialysis catheter is unchanged.  Sequelae of prior CABG again noted.  Lungs are hypoinflated. Bilateral pleural effusions, left greater than right, are slightly improved relative to prior study. Associated bibasilar atelectasis is present. No new focal infiltrate. No overt pulmonary edema. No pneumothorax.  Osseous structures are unchanged.  IMPRESSION: 1. Tip of the left IJ central venous catheter overlying the cavoatrial junction. Remaining support apparatus as above. 2. Slight interval decrease in size of bilateral pleural effusions, left greater than right, with similar bibasilar atelectasis.   Electronically Signed   By: Jeannine Boga M.D.   On: 09/30/2013 03:23   Dg Chest Port 1 View  09/29/2013   CLINICAL DATA:  Status post right partial colectomy  EXAM: PORTABLE CHEST - 1 VIEW  COMPARISON:  02/17/2013  FINDINGS: Cardiac shadow remains mildly enlarged. A dialysis catheter is seen in satisfactory position. An endotracheal tube is now noted 3.8 cm above the carina. A nasogastric catheter extends into the stomach. Bilateral pleural effusions left greater than right are seen. The left effusion appears chronic in nature when compare with the previous exam from multiple months previous. No new focal abnormality is seen.  IMPRESSION: Tubes and lines as described.  Pleural  effusions left greater than right. The left effusion appears chronic in nature.   Electronically Signed   By: Inez Catalina M.D.   On: 09/29/2013 18:00   Dg Abd Portable 1v  10/01/2013   CLINICAL DATA:  Distension.  Recent hemicolectomy with colostomy.  EXAM: PORTABLE ABDOMEN - 1 VIEW  COMPARISON:  09/29/2013 and 09/28/2013  FINDINGS: Central venous catheter unchanged with tip over the right atrium. Nasogastric tube is present with tip over the midline upper abdomen likely over the distal stomach. There is continued evidence of multiple air-filled dilated small bowel loops. There is minimal air within the colon. There is continued evidence of an unusual air collection in the right upper quadrant as cannot exclude free peritoneal air as seen on the prior study. Remainder of the exam is unchanged.  IMPRESSION: Persistent air-filled dilated small bowel loops with paucity of colonic gas. Findings suggesting small bowel obstructive process. Persistent unusual air collection over the  right upper quadrant as this may represent free peritoneal air as seen previously likely due to patient's recent hemicolectomy.  Nasogastric tube with tip over the mid abdomen likely in the distal stomach.  Findings discussed with patient's nurse, Morey Hummingbird, at the time of dictation.   Electronically Signed   By: Marin Olp M.D.   On: 10/01/2013 14:56   ASSESSMENT / PLAN:  PULMONARY A:  Expected ventilatory support status post exploratory laparotomy for colonic mass with obstruction and perforation, resolved Reported hx of COPD. P:   -supplemental oxygen PRN -IS / Flutter valve -f/u CXR -prn BD's  CARDIOVASCULAR A:  Septic shock from peritonitis and recent C diff, resolving Hx of CAD, A fib. Chronic hypotension P:  -fluids to keep CVP 4 to 6 -goal SBP > 90, MAP > 60 -d/c neo -midodrine -hold amiodarone, ASA  RENAL A:   End-stage renal disease >> still has residual urine in bladder. P:   -per renal -will need  AV graft >> deferred until more stable; VVS following -I/O cath prn for urine volume > 300 ml  GASTROINTESTINAL A:   Perforation of colon with colonic mass and colonic obstruction status post laparotomy. History of Clostridium difficile colitis. History of GI bleeding. Protein calorie malnutrition. P:   -post-op care, nutrition per CCS -protonix as preadmission  HEMATOLOGIC A:   Acute blood loss anemia after surgery. 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 P:  -f/u CBC -transfuse for Hb < 7 -SCD for DVT prevention -defer aranesp to renal  INFECTIOUS A:   Septic shock from peritonitis. Recent C diff. 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 Hx of Gout, osteoporosis. P:   -Sliding-scale insulin protocol -hold allopurinol, miacalcin  NEUROLOGIC A:  Postoperative pain. Hx of DM neuropathy, Anxiety/depression. P:   -prn fentanyl -restart lidoderm patch -hold neurotin  I have personally obtained history, examined patient, evaluated and interpreted laboratory and imaging results, reviewed medical records, formulated assessment / plan and placed orders.  CRITICAL CARE:  The patient is critically ill with multiple organ systems failure and requires high complexity decision making for assessment and support, frequent evaluation and titration of therapies, application of advanced monitoring technologies and extensive interpretation of multiple databases. Critical Care Time devoted to patient care services described in this note is 35 minutes.   Doree Fudge, MD Pulmonary and [place] Pager: [phone]  10/01/2013, 4:11 PM

## 2013-10-01 NOTE — Progress Notes (Deleted)
Patient ID: Nicholas [name], male   DOB: [birth year], 73 y.o.   MRN: [id number]  Surgery is signing off at this.  Please contact [facility] with questions or concerns.  Davonta Stroot, ANP-BC

## 2013-10-01 NOTE — Progress Notes (Signed)
[facility] Progress Note   Subjective: low dose pressors, no complaints  Exam  Blood pressure 111/69, pulse 105, temperature 97.5 F (36.4 C), temperature source Axillary, resp. rate 15, height 5\' 6"  (1.676 m), weight 66.9 kg (147 lb 7.8 oz), SpO2 97.00%. Extubated, alert, no distress ETT in place Clear lungs bilat RRR no MRG Abd firm, + BS, ileostomy in place No LE edema R IJ cath in place, L forearm AVF no bruit  Dialysis: [facility] 4h   69kg  2K//2.25Bath   Heparin 5000   LUE AVF (failed now, has R IJ cath)   Hectorol 2     Epo 5000     Venofer none Last labs: Hb 9.9 last pth 296   Assessment: 1 Bowel obstruction due to colon tumor / perf colon > s/p R colectomy / ileostomy 2 ESRD on HD 3 Access- inflow failure L arm AVF, has new Diatek cath 5 CDif on IV flagyl 6 CAD / afib- no AC due to hx GIB 7 Anemia Hb 11.4, darbe 100/wk 8 Recent L1 fx, nonsurgical Rx 9 HTN/volume- low BP's chronic on midodrine, volume low, keep even with HD  Plan- HD today in ICU, no fluid off    Kelly Splinter MD  pager [phone]    cell [phone]  10/01/2013, 10:38 AM     Recent Labs Lab 09/27/13 0500 09/28/13 0738 09/29/13 0508 09/29/13 2045 10/01/13 0400  NA 140 139 139 134* 135*  K 4.4 4.0 3.2* 3.2* 4.2  CL 97 98 97 97 100  CO2 23 20 23  18* 20  GLUCOSE 84 92 122* 193* 109*  BUN 58* 44* 21 25* 34*  CREATININE 6.29* 5.07* 3.01* 3.27* 3.95*  CALCIUM 7.3* 7.3* 7.3* 7.1* 6.9*  PHOS 5.4* 4.6  --   --   --     Recent Labs Lab 09/25/13 1621 09/27/13 0500 09/28/13 0738  AST 11  --   --   ALT 6  --   --   ALKPHOS 138*  --   --   BILITOT 0.5  --   --   PROT 5.7*  --   --   ALBUMIN 2.0* 1.7* 1.7*    Recent Labs Lab 09/25/13 1621  09/29/13 0508 09/29/13 2045 10/01/13 0400  WBC 14.1*  < > 14.9* 31.6* 23.3*  NEUTROABS 12.1*  --   --   --   --   HGB 10.4*  < > 9.9* 11.4* 9.1*  HCT 33.8*  < > 31.8* 35.7* 28.1*  MCV 96.0  < > 94.1 94.2 94.3  PLT 367  < > 325  359 254  < > = values in this interval not displayed. [name] antiseptic oral rinse  1 application Mouth Rinse QID  . calcitonin (salmon)  1 spray Alternating Nares Daily  . chlorhexidine  15 mL Mouth/Throat BID  . darbepoetin  60 mcg Intravenous Q Mon-HD  . doxercalciferol  2 mcg Intravenous Q M,W,F-HD  . ertapenem  500 mg Intravenous Q24H  . insulin aspart  0-9 Units Subcutaneous 6 times per day  . metronidazole  500 mg Intravenous Q8H  . midodrine  5 mg Oral Q M,W,F-HD  . pantoprazole (PROTONIX) IV  40 mg Intravenous Q24H  . potassium chloride  10 mEq Intravenous Once   . sodium chloride 20 mL/hr at 09/29/13 2000  . dextrose 5 % and 0.2 % NaCl 65 mL/hr (09/28/13 1835)  . phenylephrine (NEO-SYNEPHRINE) Adult infusion 5 mcg/min (10/01/13 0800)   acetaminophen, acetaminophen,  fentaNYL, promethazine

## 2013-10-01 NOTE — Progress Notes (Signed)
Patient ID: Nicholas [name], male   DOB: 07-25-1941, 73 y.o.   MRN: [id number]  Subjective: Awake and extubated.  Denies abd pain or nausea.  Ostomy functioning  Objective:  Vital signs:  Filed Vitals:   10/01/13 0900 10/01/13 1000 10/01/13 1100 10/01/13 1130  BP: 94/60 111/69 94/62   Pulse: 92 105 89 99  Temp:      TempSrc:      Resp: $Remo'11 15 16 20  'ShhUc$ Height:      Weight:      SpO2: 94% 97% 95% 95%    Last BM Date: 09/25/13  Intake/Output   Yesterday:  02/13 0701 - 02/14 0700 In: 1988.1 [I.V.:928.1; NG/GT:210; IV Piggyback:850] Out: 097 [Urine:400; Emesis/NG output:250; Stool:125] This shift:  Total I/O In: 219.5 [I.V.:89.5; NG/GT:30; IV Piggyback:100] Out: -    Physical Exam: General: Pt awake/alert, NAD Chest: CTA CV:  Pulses intact.  Regular rhythm Abdomen: Soft.  Nondistended.  Appropriately tender to palpation.  Midline wound is clean, beefy red, dressing replaced.  Stool in ostomy bag.  Stoma looks ok Ext:  SCDs BLE.  No mjr edema.  No cyanosis   Problem List:   Principal Problem:   Septic shock(785.52) Active Problems:   ESRD (end stage renal disease) on dialysis   DM (diabetes mellitus), type 2 with renal complications   Ischemic cardiomyopathy   CAD (coronary artery disease)   Intractable low back pain   Systolic CHF   L1 vertebral fracture   Protein-calorie malnutrition, severe   Small bowel obstruction   HCAP (healthcare-associated pneumonia)   Hypokalemia   Severe sepsis(995.92)   Peritonitis    Results:   Labs: Results for orders placed during the hospital encounter of 09/25/13 (from the past 48 hour(s))  GLUCOSE, CAPILLARY     Status: Abnormal   Collection Time    09/29/13  1:29 PM      Result Value Ref Range   Glucose-Capillary 100 (*) 70 - 99 mg/dL  POCT I-STAT 3, BLOOD GAS (G3+)     Status: Abnormal   Collection Time    09/29/13  5:44 PM      Result Value Ref Range   pH, Arterial 7.430  7.350 - 7.450   pCO2 arterial 29.6 (*)  35.0 - 45.0 mmHg   pO2, Arterial 151.0 (*) 80.0 - 100.0 mmHg   Bicarbonate 19.7 (*) 20.0 - 24.0 mEq/L   TCO2 21  0 - 100 mmol/L   O2 Saturation 99.0     Acid-base deficit 4.0 (*) 0.0 - 2.0 mmol/L   Patient temperature 98.6 F     Collection site RADIAL, ALLEN'S TEST ACCEPTABLE     Drawn by Operator     Sample type ARTERIAL    GLUCOSE, CAPILLARY     Status: Abnormal   Collection Time    09/29/13  8:10 PM      Result Value Ref Range   Glucose-Capillary 166 (*) 70 - 99 mg/dL   Comment 1 Documented in Chart     Comment 2 Notify RN    CBC     Status: Abnormal   Collection Time    09/29/13  8:45 PM      Result Value Ref Range   WBC 31.6 (*) 4.0 - 10.5 K/uL   RBC 3.79 (*) 4.22 - 5.81 MIL/uL   Hemoglobin 11.4 (*) 13.0 - 17.0 g/dL   HCT 35.7 (*) 39.0 - 52.0 %   MCV 94.2  78.0 - 100.0 fL   MCH 30.1  26.0 - 34.0 pg   MCHC 31.9  30.0 - 36.0 g/dL   RDW 19.4 (*) 11.5 - 15.5 %   Platelets 359  150 - 400 K/uL  BASIC METABOLIC PANEL     Status: Abnormal   Collection Time    09/29/13  8:45 PM      Result Value Ref Range   Sodium 134 (*) 137 - 147 mEq/L   Potassium 3.2 (*) 3.7 - 5.3 mEq/L   Chloride 97  96 - 112 mEq/L   CO2 18 (*) 19 - 32 mEq/L   Glucose, Bld 193 (*) 70 - 99 mg/dL   BUN 25 (*) 6 - 23 mg/dL   Creatinine, Ser 3.27 (*) 0.50 - 1.35 mg/dL   Calcium 7.1 (*) 8.4 - 10.5 mg/dL   GFR calc non Af Amer 18 (*) >90 mL/min   GFR calc Af Amer 21 (*) >90 mL/min   Comment: (NOTE)     The eGFR has been calculated using the CKD EPI equation.     This calculation has not been validated in all clinical situations.     eGFR's persistently <90 mL/min signify possible Chronic Kidney     Disease.  LACTIC ACID, PLASMA     Status: None   Collection Time    09/29/13  8:45 PM      Result Value Ref Range   Lactic Acid, Venous 1.5  0.5 - 2.2 mmol/L  GLUCOSE, CAPILLARY     Status: Abnormal   Collection Time    09/29/13 11:32 PM      Result Value Ref Range   Glucose-Capillary 126 (*) 70 - 99  mg/dL   Comment 1 Documented in Chart     Comment 2 Notify RN    GLUCOSE, CAPILLARY     Status: None   Collection Time    09/30/13  3:40 AM      Result Value Ref Range   Glucose-Capillary 93  70 - 99 mg/dL   Comment 1 Documented in Chart     Comment 2 Notify RN    CARBOXYHEMOGLOBIN     Status: Abnormal   Collection Time    09/30/13  5:25 AM      Result Value Ref Range   Total hemoglobin 13.0 (*) 13.5 - 18.0 g/dL   O2 Saturation 52.3     Carboxyhemoglobin 1.0  0.5 - 1.5 %   Methemoglobin 1.1  0.0 - 1.5 %  GLUCOSE, CAPILLARY     Status: None   Collection Time    09/30/13  7:40 AM      Result Value Ref Range   Glucose-Capillary 99  70 - 99 mg/dL  GLUCOSE, CAPILLARY     Status: Abnormal   Collection Time    09/30/13 11:54 AM      Result Value Ref Range   Glucose-Capillary 113 (*) 70 - 99 mg/dL  GLUCOSE, CAPILLARY     Status: None   Collection Time    09/30/13  4:24 PM      Result Value Ref Range   Glucose-Capillary 96  70 - 99 mg/dL  GLUCOSE, CAPILLARY     Status: Abnormal   Collection Time    09/30/13  8:08 PM      Result Value Ref Range   Glucose-Capillary 103 (*) 70 - 99 mg/dL  GLUCOSE, CAPILLARY     Status: Abnormal   Collection Time    10/01/13 12:31 AM      Result Value Ref Range  Glucose-Capillary 112 (*) 70 - 99 mg/dL   Comment 1 Documented in Chart     Comment 2 Notify RN    CBC     Status: Abnormal   Collection Time    10/01/13  4:00 AM      Result Value Ref Range   WBC 23.3 (*) 4.0 - 10.5 K/uL   RBC 2.98 (*) 4.22 - 5.81 MIL/uL   Hemoglobin 9.1 (*) 13.0 - 17.0 g/dL   Comment: DELTA CHECK NOTED     REPEATED TO VERIFY   HCT 28.1 (*) 39.0 - 52.0 %   MCV 94.3  78.0 - 100.0 fL   MCH 30.5  26.0 - 34.0 pg   MCHC 32.4  30.0 - 36.0 g/dL   RDW 20.1 (*) 11.5 - 15.5 %   Platelets 254  150 - 400 K/uL   Comment: DELTA CHECK NOTED     REPEATED TO VERIFY  BASIC METABOLIC PANEL     Status: Abnormal   Collection Time    10/01/13  4:00 AM      Result Value Ref  Range   Sodium 135 (*) 137 - 147 mEq/L   Potassium 4.2  3.7 - 5.3 mEq/L   Comment: HEMOLYSIS AT THIS LEVEL MAY AFFECT RESULT     DELTA CHECK NOTED   Chloride 100  96 - 112 mEq/L   CO2 20  19 - 32 mEq/L   Glucose, Bld 109 (*) 70 - 99 mg/dL   BUN 34 (*) 6 - 23 mg/dL   Creatinine, Ser 3.95 (*) 0.50 - 1.35 mg/dL   Calcium 6.9 (*) 8.4 - 10.5 mg/dL   GFR calc non Af Amer 14 (*) >90 mL/min   GFR calc Af Amer 16 (*) >90 mL/min   Comment: (NOTE)     The eGFR has been calculated using the CKD EPI equation.     This calculation has not been validated in all clinical situations.     eGFR's persistently <90 mL/min signify possible Chronic Kidney     Disease.  GLUCOSE, CAPILLARY     Status: Abnormal   Collection Time    10/01/13  4:07 AM      Result Value Ref Range   Glucose-Capillary 102 (*) 70 - 99 mg/dL   Comment 1 Documented in Chart     Comment 2 Notify RN    GLUCOSE, CAPILLARY     Status: Abnormal   Collection Time    10/01/13  8:03 AM      Result Value Ref Range   Glucose-Capillary 108 (*) 70 - 99 mg/dL    Imaging / Studies: Dg Chest Port 1 View  10/01/2013   CLINICAL DATA:  Atelectasis.  EXAM: PORTABLE CHEST - 1 VIEW  COMPARISON:  Chest radiograph 09/30/2013.  FINDINGS: Right-sided hemodialysis catheter unchanged. Left-sided central venous catheter tip projects over the superior cavoatrial junction. Interval extubation. NG tube tip and side-port project over the left upper quadrant. Stable cardiac and mediastinal contours. Small bilateral pleural effusions with underlying opacities likely representing atelectasis.  IMPRESSION: Marked gaseous distention of the visualized upper abdominal bowel loops, recommend correlation with dedicated abdominal radiography.  Interval extubation.  Otherwise stable support apparatus.  Small bilateral pleural effusions.  Underlying atelectasis.   Electronically Signed   By: Lovey Newcomer M.D.   On: 10/01/2013 08:08   Dg Chest Port 1 View  09/30/2013    CLINICAL DATA:  Central line placement  EXAM: PORTABLE CHEST - 1 VIEW  COMPARISON:  Air  radiograph 09/29/2013  FINDINGS: There has been interval placement of a left IJ central venous catheter with tip overlying the cavoatrial junction. Tip of the endotracheal tube is positioned 3.6 cm above the carina. Right-sided hemodialysis catheter is unchanged.  Sequelae of prior CABG again noted.  Lungs are hypoinflated. Bilateral pleural effusions, left greater than right, are slightly improved relative to prior study. Associated bibasilar atelectasis is present. No new focal infiltrate. No overt pulmonary edema. No pneumothorax.  Osseous structures are unchanged.  IMPRESSION: 1. Tip of the left IJ central venous catheter overlying the cavoatrial junction. Remaining support apparatus as above. 2. Slight interval decrease in size of bilateral pleural effusions, left greater than right, with similar bibasilar atelectasis.   Electronically Signed   By: Rise Mu M.D.   On: 09/30/2013 03:23   Dg Chest Port 1 View  09/29/2013   CLINICAL DATA:  Status post right partial colectomy  EXAM: PORTABLE CHEST - 1 VIEW  COMPARISON:  02/17/2013  FINDINGS: Cardiac shadow remains mildly enlarged. A dialysis catheter is seen in satisfactory position. An endotracheal tube is now noted 3.8 cm above the carina. A nasogastric catheter extends into the stomach. Bilateral pleural effusions left greater than right are seen. The left effusion appears chronic in nature when compare with the previous exam from multiple months previous. No new focal abnormality is seen.  IMPRESSION: Tubes and lines as described.  Pleural effusions left greater than right. The left effusion appears chronic in nature.   Electronically Signed   By: Alcide Clever M.D.   On: 09/29/2013 18:00   Dg Abd Portable 2v  09/29/2013   CLINICAL DATA:  Status post colonoscopy. Question free intraperitoneal air.  EXAM: PORTABLE ABDOMEN - 2 VIEW  COMPARISON:  CT abdomen and  pelvis 09/25/2013.  FINDINGS: There is a large volume of free intraperitoneal air. NG tube is in place with tip in the stomach.  IMPRESSION: Study is positive for free intraperitoneal air.  Critical Value/emergent results were called by telephone at the time of interpretation on 09/29/2013 at 12:27 PM to Dr. Bernette Redbird , who verbally acknowledged these results.   Electronically Signed   By: Drusilla Kanner M.D.   On: 09/29/2013 12:30    Medications / Allergies: per chart  Antibiotics: Anti-infectives   Start     Dose/Rate Route Frequency Ordered Stop   09/30/13 1400  ertapenem (INVANZ) 0.5 g in sodium chloride 0.9 % 50 mL IVPB     500 mg 100 mL/hr over 30 Minutes Intravenous Every 24 hours 09/29/13 2202     09/29/13 1415  ertapenem (INVANZ) 1 g in sodium chloride 0.9 % 50 mL IVPB     1 g 100 mL/hr over 30 Minutes Intravenous  Once 09/29/13 1159 09/29/13 1412   09/29/13 0600  cefUROXime (ZINACEF) 1.5 g in dextrose 5 % 50 mL IVPB  Status:  Discontinued     1.5 g 100 mL/hr over 30 Minutes Intravenous On call to O.R. 09/28/13 1711 09/29/13 2203   09/28/13 1200  ceFAZolin (ANCEF) IVPB 2 g/50 mL premix    Comments:  Tube to 17   2 g 100 mL/hr over 30 Minutes Intravenous  Once 09/28/13 1155 09/28/13 1246   09/27/13 1200  vancomycin (VANCOCIN) IVPB 1000 mg/200 mL premix  Status:  Discontinued     1,000 mg 200 mL/hr over 60 Minutes Intravenous Every M-W-F (Hemodialysis) 09/26/13 1638 09/26/13 1721   09/26/13 1745  metroNIDAZOLE (FLAGYL) IVPB 500 mg     500  mg 100 mL/hr over 60 Minutes Intravenous Every 8 hours 09/26/13 1722     09/26/13 1200  vancomycin (VANCOCIN) IVPB 750 mg/150 ml premix  Status:  Discontinued     750 mg 150 mL/hr over 60 Minutes Intravenous Every M-W-F (Hemodialysis) 09/25/13 2344 09/26/13 1051   09/26/13 1200  vancomycin (VANCOCIN) IVPB 1000 mg/200 mL premix  Status:  Discontinued     1,000 mg 200 mL/hr over 60 Minutes Intravenous Every M-W-F (Hemodialysis) 09/26/13  1052 09/26/13 1638   09/26/13 0600  piperacillin-tazobactam (ZOSYN) IVPB 2.25 g  Status:  Discontinued     2.25 g 100 mL/hr over 30 Minutes Intravenous 3 times per day 09/25/13 2126 09/26/13 1721   09/26/13 0000  vancomycin (VANCOCIN) 500 mg in sodium chloride irrigation 0.9 % 100 mL ENEMA  Status:  Discontinued     500 mg Rectal 4 times per day 09/25/13 2337 09/26/13 1722   09/25/13 2130  vancomycin (VANCOCIN) IVPB 750 mg/150 ml premix     750 mg 150 mL/hr over 60 Minutes Intravenous  Once 09/25/13 2124 09/26/13 0145   09/25/13 2000  piperacillin-tazobactam (ZOSYN) IVPB 3.375 g     3.375 g 100 mL/hr over 30 Minutes Intravenous  Once 09/25/13 1853 09/25/13 2002   09/25/13 2000  vancomycin (VANCOCIN) IVPB 1000 mg/200 mL premix     1,000 mg 200 mL/hr over 60 Minutes Intravenous  Once 09/25/13 1859 09/25/13 2113      Assessment/Plan  Recent back fx  ESRD on HD  CAD  DM   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 Grandville Silos 09/29/13 POD#2 -continue NGT, bowel rest.  Pt having ostomy function but AXR still appears to have an ileus -continue antibiotics. Indication: perforated bowel, c diff.  invanz D#1 Flagyl D#4 -await pathology results -monitor stoma -repeat labs and AXR in AM  Rosario Adie, MD  [facility] [place] [facility]   10/01/2013  12:00 PM

## 2013-10-02 ENCOUNTER — Inpatient Hospital Stay (HOSPITAL_COMMUNITY): Payer: Medicare Other

## 2013-10-02 LAB — GLUCOSE, CAPILLARY
GLUCOSE-CAPILLARY: 79 mg/dL (ref 70–99)
GLUCOSE-CAPILLARY: 82 mg/dL (ref 70–99)
GLUCOSE-CAPILLARY: 86 mg/dL (ref 70–99)
Glucose-Capillary: 87 mg/dL (ref 70–99)
Glucose-Capillary: 85 mg/dL (ref 70–99)
Glucose-Capillary: 88 mg/dL (ref 70–99)
Glucose-Capillary: 86 mg/dL (ref 70–99)

## 2013-10-02 LAB — CBC
HCT: 25.3 % — ABNORMAL LOW (ref 39.0–52.0)
HEMOGLOBIN: 8.1 g/dL — AB (ref 13.0–17.0)
MCH: 30.2 pg (ref 26.0–34.0)
MCHC: 32 g/dL (ref 30.0–36.0)
MCV: 94.4 fL (ref 78.0–100.0)
PLATELETS: 212 10*3/uL (ref 150–400)
RBC: 2.68 MIL/uL — AB (ref 4.22–5.81)
RDW: 20.7 % — ABNORMAL HIGH (ref 11.5–15.5)
WBC: 15.4 10*3/uL — AB (ref 4.0–10.5)

## 2013-10-02 LAB — BASIC METABOLIC PANEL WITH GFR
BUN: 14 mg/dL (ref 6–23)
CO2: 22 meq/L (ref 19–32)
Calcium: 7 mg/dL — ABNORMAL LOW (ref 8.4–10.5)
Chloride: 102 meq/L (ref 96–112)
Creatinine, Ser: 2.17 mg/dL — ABNORMAL HIGH (ref 0.50–1.35)
GFR calc Af Amer: 34 mL/min — ABNORMAL LOW
GFR calc non Af Amer: 29 mL/min — ABNORMAL LOW
Glucose, Bld: 93 mg/dL (ref 70–99)
Potassium: 3.1 meq/L — ABNORMAL LOW (ref 3.7–5.3)
Sodium: 140 meq/L (ref 137–147)

## 2013-10-02 MED ORDER — HYDROMORPHONE HCL PF 1 MG/ML IJ SOLN
INTRAMUSCULAR | Status: AC
  Filled 2013-10-02: qty 1

## 2013-10-02 MED ORDER — HYDROMORPHONE HCL PF 1 MG/ML IJ SOLN
2.0000 mg | INTRAMUSCULAR | Status: DC | PRN
  Administered 2013-10-02 – 2013-10-11 (×27): 2 mg via INTRAVENOUS
  Filled 2013-10-02 (×25): qty 2

## 2013-10-02 MED ORDER — MIDODRINE HCL 5 MG PO TABS
10.0000 mg | ORAL_TABLET | Freq: Two times a day (BID) | ORAL | Status: DC
  Administered 2013-10-04 – 2013-10-12 (×17): 10 mg via ORAL
  Filled 2013-10-02 (×23): qty 2

## 2013-10-02 NOTE — Progress Notes (Signed)
Weekend CSW met with patient to confirm his return to [facility] at discharge. Patient states that he is "not feeling well" today. He states that he had a fall back in December and has not been able to recover since. He reports being at [facility] for about a week before being hospitalized this current admission. Patient wishes to return to continue his PT services, and hopes to make some recovery with regards to his health. CSW provided support and informed patient that CSW will assist in facilitating transition back to SNF. Patient thanked CSW for assistance, has no further questions at this time. CSW will continue to follow for discharge planning.  Tilden Fossa, MSW, [place] Clinical Social Worker [facility] Emergency Dept. [phone]

## 2013-10-02 NOTE — Progress Notes (Signed)
PULMONARY / CRITICAL CARE MEDICINE  Name: Nicholas [name] MRN: [id number] DOB: 06/19/1941    ADMISSION DATE:  09/25/2013 CONSULTATION DATE:  10/02/2013  REFERRING MD :  [facility] PRIMARY SERVICE: PCCM  CHIEF COMPLAINT:  Post op on vent s/p ex lap for SBO.   BRIEF PATIENT DESCRIPTION: 73 yo white male with end-stage renal disease status post exploratory laparotomy for small bowel obstruction.  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  The patient with right colectomy and repair of descending and sigmoid serosal tears and ileostomy placement.  The patient is on vasopressor support postop mechanical ventilation and critical care was consult   STUDIES:   LINES / TUBES: Rt IJ HD cath 2/12 >>  ETT 2/12 >> 2/13 Rt radial aline 2/12 >> 2/15 Lt IJ CVL 2/12 >>   CULTURES:  ANTIBIOTICS: Flagyl 2/9 invanz 2/12    SIGNIFICANT EVENTS: 2/12 Rt colectomy, repair descending and sigmoid serosal tears, ileostomy 2/14:  Remained extubated overnight. Complains of back pain.   SUBJECTIVE/OVERNIGHT/INTERVAL HX 10/02/13:  Denies complaints. Wants his Rt forearm PIV out; look infiltrated.  Doing well 2d post extubation  VITAL SIGNS: Temp:  [97.8 F (36.6 C)-98.2 F (36.8 C)] 97.8 F (36.6 C) (02/15 0400) Pulse Rate:  [87-111] 101 (02/15 1100) Resp:  [10-33] 16 (02/15 1100) BP: (87-127)/(54-83) 127/83 mmHg (02/15 1100) SpO2:  [91 %-99 %] 99 % (02/15 1100) HEMODYNAMICS: CVP:  [4 mmHg-7 mmHg] 7 mmHg VENTILATOR SETTINGS:   INTAKE / OUTPUT: Intake/Output     02/14 0701 - 02/15 0700 02/15 0701 - 02/16 0700   I.V. (mL/kg) 493.3 (6.9) 80 (1.1)   NG/GT 180 30   IV Piggyback 350 100   Total Intake(mL/kg) 1023.3 (14.4) 210 (2.9)   Urine (mL/kg/hr)     Emesis/NG output 400 (0.2)    Other -700 (-0.4)    Stool 250 (0.1)    Total Output -50     Net +1073.3 +210          PHYSICAL EXAMINATION: General:  Appears chronically  ill, in no distress. Older than stated age Neuro:  Awake and alert HEENT:  Dry mucous membranes, poor dentition, NGT Cardiovascular:  Regular rate and rhythm Lungs:  Bilateral diminished air entry Abdomen:  Postop abdomen Musculoskeletal:  Full range of motion Skin:  Clear  LABS: PULMONARY  Recent Labs Lab 09/29/13 1744 09/30/13 0525  PHART 7.430  --   PCO2ART 29.6*  --   PO2ART 151.0*  --   HCO3 19.7*  --   TCO2 21  --   O2SAT 99.0 52.3    CBC  Recent Labs Lab 09/29/13 2045 10/01/13 0400 10/02/13 0357  HGB 11.4* 9.1* 8.1*  HCT 35.7* 28.1* 25.3*  WBC 31.6* 23.3* 15.4*  PLT 359 254 212    COAGULATION  Recent Labs Lab 09/29/13 0508  INR 1.62*    CARDIAC  No results found for this basename: TROPONINI,  in the last 168 hours No results found for this basename: PROBNP,  in the last 168 hours   CHEMISTRY  Recent Labs Lab 09/27/13 0500 09/28/13 0738 09/29/13 0508 09/29/13 2045 10/01/13 0400 10/02/13 0357  NA 140 139 139 134* 135* 140  K 4.4 4.0 3.2* 3.2* 4.2 3.1*  CL 97 98 97 97 100 102  CO2 23 20 23  18* 20 22  GLUCOSE 84 92 122* 193* 109* 93  BUN 58* 44* 21 25* 34* 14  CREATININE  6.29* 5.07* 3.01* 3.27* 3.95* 2.17*  CALCIUM 7.3* 7.3* 7.3* 7.1* 6.9* 7.0*  PHOS 5.4* 4.6  --   --   --   --    Estimated Creatinine Clearance: 28.6 ml/min (by C-G formula based on Cr of 2.17).   LIVER  Recent Labs Lab 09/25/13 1621 09/27/13 0500 09/28/13 0738 09/29/13 0508  AST 11  --   --   --   ALT 6  --   --   --   ALKPHOS 138*  --   --   --   BILITOT 0.5  --   --   --   PROT 5.7*  --   --   --   ALBUMIN 2.0* 1.7* 1.7*  --   INR  --   --   --  1.62*     INFECTIOUS  Recent Labs Lab 09/25/13 1634 09/29/13 2045  LATICACIDVEN 0.86 1.5     ENDOCRINE CBG (last 3)   Recent Labs  10/02/13 0412 10/02/13 0747 10/02/13 1201  GLUCAP 87 85 88         IMAGING x48h  Dg Chest Port 1 View  10/01/2013   CLINICAL DATA:  Atelectasis.  EXAM:  PORTABLE CHEST - 1 VIEW  COMPARISON:  Chest radiograph 09/30/2013.  FINDINGS: Right-sided hemodialysis catheter unchanged. Left-sided central venous catheter tip projects over the superior cavoatrial junction. Interval extubation. NG tube tip and side-port project over the left upper quadrant. Stable cardiac and mediastinal contours. Small bilateral pleural effusions with underlying opacities likely representing atelectasis.  IMPRESSION: Marked gaseous distention of the visualized upper abdominal bowel loops, recommend correlation with dedicated abdominal radiography.  Interval extubation.  Otherwise stable support apparatus.  Small bilateral pleural effusions.  Underlying atelectasis.   Electronically Signed   By: Lovey Newcomer M.D.   On: 10/01/2013 08:08   Dg Abd Portable 1v  10/02/2013   CLINICAL DATA:  Evaluate for resolution of ileus  EXAM: PORTABLE ABDOMEN - 1 VIEW  COMPARISON:  DG ABD PORTABLE 1V dated 10/01/2013; DG ABD PORTABLE 2V dated 09/29/2013; CT ABD/PELV WO CM dated 09/25/2013  FINDINGS: There is a paucity of bowel gas. There is persistent moderate gas distention of several mildly dilated loops of rather featureless appearing small bowel within the left mid hemi abdomen though there is decreased gaseous distention of these bowel loops. A minimal amount of air is seen within the ascending and descending colon.  Nondiagnostic evaluation pneumoperitoneum. An enteric tube and side port overlie the expected location of the mid body of the stomach.  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 Post bilateral total hip replacements, incompletely imaged.  IMPRESSION: 1. Findings most suggestive of improving ileus, though note, there is persistent mild gas distention of several featureless loops of small bowel within the mid abdomen. Clinical correlation is advised. 2. Sequela of ankylosing spondylitis   Electronically Signed   By: Sandi Mariscal M.D.   On: 10/02/2013  08:21   Dg Abd Portable 1v  10/01/2013   CLINICAL DATA:  Distension.  Recent hemicolectomy with colostomy.  EXAM: PORTABLE ABDOMEN - 1 VIEW  COMPARISON:  09/29/2013 and 09/28/2013  FINDINGS: Central venous catheter unchanged with tip over the right atrium. Nasogastric tube is present with tip over the midline upper abdomen likely over the distal stomach. There is continued evidence of multiple air-filled dilated small bowel loops. There is minimal air within the colon. There is continued evidence of an unusual air collection in the right upper  quadrant as cannot exclude free peritoneal air as seen on the prior study. Remainder of the exam is unchanged.  IMPRESSION: Persistent air-filled dilated small bowel loops with paucity of colonic gas. Findings suggesting small bowel obstructive process. Persistent unusual air collection over the right upper quadrant as this may represent free peritoneal air as seen previously likely due to patient's recent hemicolectomy.  Nasogastric tube with tip over the mid abdomen likely in the distal stomach.  Findings discussed with patient's nurse, Morey Hummingbird, at the time of dictation.   Electronically Signed   By: Marin Olp M.D.   On: 10/01/2013 14:56      ASSESSMENT / PLAN:  PULMONARY A:  Expected ventilatory support status post exploratory laparotomy for colonic mass with obstruction and perforation, resolved Reported hx of COPD.  10/02/13:  Doing well 48h post extubation  P:   -supplemental oxygen PRN -IS / Flutter valve -f/u CXR -prn BD's  CARDIOVASCULAR A:  Septic shock from peritonitis and recent C diff, resolving Hx of CAD, A fib. Chronic hypotension P:  -fluids to keep CVP 4 to 6 -goal SBP > 90, MAP > 60 -d/c neo -midodrine -hold amiodarone, ASA  RENAL A:   End-stage renal disease >> still has residual urine in bladder. P:   -per renal -will need AV graft >> deferred until more stable; VVS following -I/O cath prn for urine volume > 300  ml  GASTROINTESTINAL A:   Perforation of colon with colonic mass and colonic obstruction status post laparotomy. History of Clostridium difficile colitis. History of GI bleeding. Protein calorie malnutrition. P:   -post-op care, nutrition per [facility] -protonix as preadmission  HEMATOLOGIC A:   Acute blood loss anemia after surgery. 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 P:  -f/u CBC -transfuse for Hb < 7 -SCD for DVT prevention -defer aranesp to renal  INFECTIOUS A:   Septic shock from peritonitis. Recent C diff. 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 Hx of Gout, osteoporosis. P:   -Sliding-scale insulin protocol -hold allopurinol, miacalcin  NEUROLOGIC A:  Postoperative pain. Hx of DM neuropathy, Anxiety/depression. P:   -prn fentanyl -restart lidoderm patch -hold neurotin  GLOBAL  10/02/13: no family at bedside. DC aline. Patient updated   Dr. Brand Males, M.D., [facility].C.P Pulmonary and Critical Care Medicine Staff Physician [place] [facility] Pager: [phone], If no answer or between  15:00h - 7:00h: call [phone]  10/02/2013 2:07 PM

## 2013-10-02 NOTE — Progress Notes (Signed)
Patient ID: Nicholas [name], male   DOB: 1941-04-11, 73 y.o.   MRN: [id number]  Subjective: White count trending down.  Has a lot of pain.    Objective:  Vital signs:  Filed Vitals:   10/02/13 0500 10/02/13 0600 10/02/13 0700 10/02/13 0800  BP: 103/61 94/60 113/70 119/72  Pulse: 92 90 98 96  Temp:      TempSrc:      Resp: _0 Height:      Weight:      SpO2: 91% 94% 96% 97%    Last BM Date: 10/01/13  Intake/Output   Yesterday:  02/14 0701 - 02/15 0700 In: 1023.3 [I.V.:493.3; NG/GT:180; IV Piggyback:350] Out: -50 [Emesis/NG output:400; Stool:250] This shift:  Total I/O In: 70 [I.V.:40; NG/GT:30] Out: -   Physical Exam:  General: Pt awake/alert/ on vent. NAD  Abdomen: Soft. Nondistended. Appropriately tender. Midline wound is clean, beefy red, dressing replaced. Stoma does not appear necrosed, stool in ostomy.   Problem List:   Principal Problem:   Septic shock(785.52) Active Problems:   ESRD (end stage renal disease) on dialysis   DM (diabetes mellitus), type 2 with renal complications   Ischemic cardiomyopathy   CAD (coronary artery disease)   Intractable low back pain   Systolic CHF   L1 vertebral fracture   Protein-calorie malnutrition, severe   Small bowel obstruction   HCAP (healthcare-associated pneumonia)   Hypokalemia   Severe sepsis(995.92)   Peritonitis    Results:   Labs: Results for orders placed during the hospital encounter of 09/25/13 (from the past 48 hour(s))  GLUCOSE, CAPILLARY     Status: Abnormal   Collection Time    09/30/13 11:54 AM      Result Value Ref Range   Glucose-Capillary 113 (*) 70 - 99 mg/dL  GLUCOSE, CAPILLARY     Status: None   Collection Time    09/30/13  4:24 PM      Result Value Ref Range   Glucose-Capillary 96  70 - 99 mg/dL  GLUCOSE, CAPILLARY     Status: Abnormal   Collection Time    09/30/13  8:08 PM      Result Value Ref Range   Glucose-Capillary 103 (*) 70 - 99 mg/dL  GLUCOSE, CAPILLARY      Status: Abnormal   Collection Time    10/01/13 12:31 AM      Result Value Ref Range   Glucose-Capillary 112 (*) 70 - 99 mg/dL   Comment 1 Documented in Chart     Comment 2 Notify RN    CBC     Status: Abnormal   Collection Time    10/01/13  4:00 AM      Result Value Ref Range   WBC 23.3 (*) 4.0 - 10.5 K/uL   RBC 2.98 (*) 4.22 - 5.81 MIL/uL   Hemoglobin 9.1 (*) 13.0 - 17.0 g/dL   Comment: DELTA CHECK NOTED     REPEATED TO VERIFY   HCT 28.1 (*) 39.0 - 52.0 %   MCV 94.3  78.0 - 100.0 fL   MCH 30.5  26.0 - 34.0 pg   MCHC 32.4  30.0 - 36.0 g/dL   RDW 20.1 (*) 11.5 - 15.5 %   Platelets 254  150 - 400 K/uL   Comment: DELTA CHECK NOTED     REPEATED TO VERIFY  BASIC METABOLIC PANEL     Status: Abnormal   Collection Time    10/01/13  4:00 AM  Result Value Ref Range   Sodium 135 (*) 137 - 147 mEq/L   Potassium 4.2  3.7 - 5.3 mEq/L   Comment: HEMOLYSIS AT THIS LEVEL MAY AFFECT RESULT     DELTA CHECK NOTED   Chloride 100  96 - 112 mEq/L   CO2 20  19 - 32 mEq/L   Glucose, Bld 109 (*) 70 - 99 mg/dL   BUN 34 (*) 6 - 23 mg/dL   Creatinine, Ser 3.95 (*) 0.50 - 1.35 mg/dL   Calcium 6.9 (*) 8.4 - 10.5 mg/dL   GFR calc non Af Amer 14 (*) >90 mL/min   GFR calc Af Amer 16 (*) >90 mL/min   Comment: (NOTE)     The eGFR has been calculated using the CKD EPI equation.     This calculation has not been validated in all clinical situations.     eGFR's persistently <90 mL/min signify possible Chronic Kidney     Disease.  GLUCOSE, CAPILLARY     Status: Abnormal   Collection Time    10/01/13  4:07 AM      Result Value Ref Range   Glucose-Capillary 102 (*) 70 - 99 mg/dL   Comment 1 Documented in Chart     Comment 2 Notify RN    GLUCOSE, CAPILLARY     Status: Abnormal   Collection Time    10/01/13  8:03 AM      Result Value Ref Range   Glucose-Capillary 108 (*) 70 - 99 mg/dL  GLUCOSE, CAPILLARY     Status: None   Collection Time    10/01/13 12:12 PM      Result Value Ref Range    Glucose-Capillary 87  70 - 99 mg/dL  GLUCOSE, CAPILLARY     Status: None   Collection Time    10/01/13  3:45 PM      Result Value Ref Range   Glucose-Capillary 77  70 - 99 mg/dL  GLUCOSE, CAPILLARY     Status: None   Collection Time    10/01/13  8:14 PM      Result Value Ref Range   Glucose-Capillary 80  70 - 99 mg/dL  GLUCOSE, CAPILLARY     Status: None   Collection Time    10/02/13 12:32 AM      Result Value Ref Range   Glucose-Capillary 86  70 - 99 mg/dL   Comment 1 Documented in Chart     Comment 2 Notify RN    CBC     Status: Abnormal   Collection Time    10/02/13  3:57 AM      Result Value Ref Range   WBC 15.4 (*) 4.0 - 10.5 K/uL   RBC 2.68 (*) 4.22 - 5.81 MIL/uL   Hemoglobin 8.1 (*) 13.0 - 17.0 g/dL   HCT 25.3 (*) 39.0 - 52.0 %   MCV 94.4  78.0 - 100.0 fL   MCH 30.2  26.0 - 34.0 pg   MCHC 32.0  30.0 - 36.0 g/dL   RDW 20.7 (*) 11.5 - 15.5 %   Platelets 212  150 - 400 K/uL  BASIC METABOLIC PANEL     Status: Abnormal   Collection Time    10/02/13  3:57 AM      Result Value Ref Range   Sodium 140  137 - 147 mEq/L   Potassium 3.1 (*) 3.7 - 5.3 mEq/L   Comment: DELTA CHECK NOTED   Chloride 102  96 - 112 mEq/L  CO2 22  19 - 32 mEq/L   Glucose, Bld 93  70 - 99 mg/dL   BUN 14  6 - 23 mg/dL   Comment: DELTA CHECK NOTED   Creatinine, Ser 2.17 (*) 0.50 - 1.35 mg/dL   Comment: DELTA CHECK NOTED   Calcium 7.0 (*) 8.4 - 10.5 mg/dL   GFR calc non Af Amer 29 (*) >90 mL/min   GFR calc Af Amer 34 (*) >90 mL/min   Comment: (NOTE)     The eGFR has been calculated using the CKD EPI equation.     This calculation has not been validated in all clinical situations.     eGFR's persistently <90 mL/min signify possible Chronic Kidney     Disease.  GLUCOSE, CAPILLARY     Status: None   Collection Time    10/02/13  4:12 AM      Result Value Ref Range   Glucose-Capillary 87  70 - 99 mg/dL  GLUCOSE, CAPILLARY     Status: None   Collection Time    10/02/13  7:47 AM      Result  Value Ref Range   Glucose-Capillary 85  70 - 99 mg/dL    Imaging / Studies: Dg Chest Port 1 View  10/01/2013   CLINICAL DATA:  Atelectasis.  EXAM: PORTABLE CHEST - 1 VIEW  COMPARISON:  Chest radiograph 09/30/2013.  FINDINGS: Right-sided hemodialysis catheter unchanged. Left-sided central venous catheter tip projects over the superior cavoatrial junction. Interval extubation. NG tube tip and side-port project over the left upper quadrant. Stable cardiac and mediastinal contours. Small bilateral pleural effusions with underlying opacities likely representing atelectasis.  IMPRESSION: Marked gaseous distention of the visualized upper abdominal bowel loops, recommend correlation with dedicated abdominal radiography.  Interval extubation.  Otherwise stable support apparatus.  Small bilateral pleural effusions.  Underlying atelectasis.   Electronically Signed   By: Lovey Newcomer M.D.   On: 10/01/2013 08:08   Dg Abd Portable 1v  10/02/2013   CLINICAL DATA:  Evaluate for resolution of ileus  EXAM: PORTABLE ABDOMEN - 1 VIEW  COMPARISON:  DG ABD PORTABLE 1V dated 10/01/2013; DG ABD PORTABLE 2V dated 09/29/2013; CT ABD/PELV WO CM dated 09/25/2013  FINDINGS: There is a paucity of bowel gas. There is persistent moderate gas distention of several mildly dilated loops of rather featureless appearing small bowel within the left mid hemi abdomen though there is decreased gaseous distention of these bowel loops. A minimal amount of air is seen within the ascending and descending colon.  Nondiagnostic evaluation pneumoperitoneum. An enteric tube and side port overlie the expected location of the mid body of the stomach.  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 Post bilateral total hip replacements, incompletely imaged.  IMPRESSION: 1. Findings most suggestive of improving ileus, though note, there is persistent mild gas distention of several featureless loops of small bowel within  the mid abdomen. Clinical correlation is advised. 2. Sequela of ankylosing spondylitis   Electronically Signed   By: Sandi Mariscal M.D.   On: 10/02/2013 08:21   Dg Abd Portable 1v  10/01/2013   CLINICAL DATA:  Distension.  Recent hemicolectomy with colostomy.  EXAM: PORTABLE ABDOMEN - 1 VIEW  COMPARISON:  09/29/2013 and 09/28/2013  FINDINGS: Central venous catheter unchanged with tip over the right atrium. Nasogastric tube is present with tip over the midline upper abdomen likely over the distal stomach. There is continued evidence of multiple air-filled dilated small bowel loops. There is minimal  air within the colon. There is continued evidence of an unusual air collection in the right upper quadrant as cannot exclude free peritoneal air as seen on the prior study. Remainder of the exam is unchanged.  IMPRESSION: Persistent air-filled dilated small bowel loops with paucity of colonic gas. Findings suggesting small bowel obstructive process. Persistent unusual air collection over the right upper quadrant as this may represent free peritoneal air as seen previously likely due to patient's recent hemicolectomy.  Nasogastric tube with tip over the mid abdomen likely in the distal stomach.  Findings discussed with patient's nurse, Morey Hummingbird, at the time of dictation.   Electronically Signed   By: Marin Olp M.D.   On: 10/01/2013 14:56    Medications / Allergies: per chart  Antibiotics: Anti-infectives   Start     Dose/Rate Route Frequency Ordered Stop   09/30/13 1400  ertapenem (INVANZ) 0.5 g in sodium chloride 0.9 % 50 mL IVPB     500 mg 100 mL/hr over 30 Minutes Intravenous Every 24 hours 09/29/13 2202     09/29/13 1415  ertapenem (INVANZ) 1 g in sodium chloride 0.9 % 50 mL IVPB     1 g 100 mL/hr over 30 Minutes Intravenous  Once 09/29/13 1159 09/29/13 1412   09/29/13 0600  cefUROXime (ZINACEF) 1.5 g in dextrose 5 % 50 mL IVPB  Status:  Discontinued     1.5 g 100 mL/hr over 30 Minutes Intravenous On  call to O.R. 09/28/13 1711 09/29/13 2203   09/28/13 1200  ceFAZolin (ANCEF) IVPB 2 g/50 mL premix    Comments:  Tube to 17   2 g 100 mL/hr over 30 Minutes Intravenous  Once 09/28/13 1155 09/28/13 1246   09/27/13 1200  vancomycin (VANCOCIN) IVPB 1000 mg/200 mL premix  Status:  Discontinued     1,000 mg 200 mL/hr over 60 Minutes Intravenous Every M-W-F (Hemodialysis) 09/26/13 1638 09/26/13 1721   09/26/13 1745  metroNIDAZOLE (FLAGYL) IVPB 500 mg     500 mg 100 mL/hr over 60 Minutes Intravenous Every 8 hours 09/26/13 1722     09/26/13 1200  vancomycin (VANCOCIN) IVPB 750 mg/150 ml premix  Status:  Discontinued     750 mg 150 mL/hr over 60 Minutes Intravenous Every M-W-F (Hemodialysis) 09/25/13 2344 09/26/13 1051   09/26/13 1200  vancomycin (VANCOCIN) IVPB 1000 mg/200 mL premix  Status:  Discontinued     1,000 mg 200 mL/hr over 60 Minutes Intravenous Every M-W-F (Hemodialysis) 09/26/13 1052 09/26/13 1638   09/26/13 0600  piperacillin-tazobactam (ZOSYN) IVPB 2.25 g  Status:  Discontinued     2.25 g 100 mL/hr over 30 Minutes Intravenous 3 times per day 09/25/13 2126 09/26/13 1721   09/26/13 0000  vancomycin (VANCOCIN) 500 mg in sodium chloride irrigation 0.9 % 100 mL ENEMA  Status:  Discontinued     500 mg Rectal 4 times per day 09/25/13 2337 09/26/13 1722   09/25/13 2130  vancomycin (VANCOCIN) IVPB 750 mg/150 ml premix     750 mg 150 mL/hr over 60 Minutes Intravenous  Once 09/25/13 2124 09/26/13 0145   09/25/13 2000  piperacillin-tazobactam (ZOSYN) IVPB 3.375 g     3.375 g 100 mL/hr over 30 Minutes Intravenous  Once 09/25/13 1853 09/25/13 2002   09/25/13 2000  vancomycin (VANCOCIN) IVPB 1000 mg/200 mL premix     1,000 mg 200 mL/hr over 60 Minutes Intravenous  Once 09/25/13 1859 09/25/13 2113      Assessment/Plan  Recent back fx  ESRD on HD  CAD  DM  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 Grandville Silos 09/29/13  POD#3  -clamp ngt, if no n/v in 6 hours, remove and start sips of clears only.  Advance diet slowly.   -continue antibiotics. Indication: perforated bowel, c diff. invanz D#3 Flagyl D#6  -await pathology results  -monitor stoma--looks a bit better today when compared to Friday -oral pain meds when tolerating liquids  Erby Pian, [facility] Pager [phone] Office [phone]  10/02/2013 10:24 AM

## 2013-10-02 NOTE — Progress Notes (Signed)
Subjective:   "Hurting all over" after sitting up this morning, no dyspnea  Objective: Vital signs in last 24 hours: Temp:  [96.8 F (36 C)-98.2 F (36.8 C)] 97.8 F (36.6 C) (02/15 0400) Pulse Rate:  [87-131] 92 (02/15 0900) Resp:  [10-33] 20 (02/15 1000) BP: (86-126)/(54-76) 126/70 mmHg (02/15 1000) SpO2:  [91 %-97 %] 95 % (02/15 0900) Weight:  [71.2 kg (156 lb 15.5 oz)] 71.2 kg (156 lb 15.5 oz) (02/14 1130) Weight change:   Intake/Output from previous day: 02/14 0701 - 02/15 0700 In: 1023.3 [I.V.:493.3; NG/GT:180; IV Piggyback:350] Out: -50 [Emesis/NG output:400; Stool:250] Intake/Output this shift: Total I/O In: 70 [I.V.:40; NG/GT:30] Out: -   Lab Results:  Recent Labs  10/01/13 0400 10/02/13 0357  WBC 23.3* 15.4*  HGB 9.1* 8.1*  HCT 28.1* 25.3*  PLT 254 212   BMET:  Recent Labs  10/01/13 0400 10/02/13 0357  NA 135* 140  K 4.2 3.1*  CL 100 102  CO2 20 22  GLUCOSE 109* 93  BUN 34* 14  CREATININE 3.95* 2.17*  CALCIUM 6.9* 7.0*   No results found for this basename: PTH,  in the last 72 hours Iron Studies: No results found for this basename: IRON, TIBC, TRANSFERRIN, FERRITIN,  in the last 72 hours  EXAM: General appearance:  Alert, in mild distress sec to pain Resp:  CTA without rales, rhonchi, or wheezes Cardio:  RRR without murmur or rub GI:  + BS, firm, ileostomy in place Extremities:  No LE edema Access:  R IJ catheter, AVF @ LFA without bruit or thrill  Dialysis: [facility]  4h 69kg 2K//2.25Bath Heparin 5000 LUE AVF (failed now, has R IJ cath)  Hectorol 2 Epo 5000 Venofer none  Last labs: Hb 9.9 last pth 296  Assessment/Plan: 1. 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 2. ESRD - HD on MWF @ [place], K 3.1.  Next HD tomorrow. 3. HTN/Volume - BP 126/70 on Midodrine; wt 71.2 kg, UF goal 2 L tomorrow. 4. Anemia - Hgb 8.1 on Aranesp 60  mcg on Mon.  Increase Aranesp. 5. Sec HPT - Ca 7, Hectorol 2 mcg.   6. L1 vertebral fx/Ankylosing spondylitis - sec to fall, managed non-operatively. 7. Hx CAD/A-fib - no anticoagulation sec to Hx GIB.   LOS: 7 days   Nicholas [name],Nicholas [name] 10/02/2013,10:25 AM  I have seen and examined patient, discussed with PA and agree with assessment and plan as outlined above.  HD tomorrow, UF 2kg if BP will tolerate Kelly Splinter MD pager [phone]    cell [phone] 10/02/2013, 1:16 PM

## 2013-10-02 NOTE — Progress Notes (Signed)
I have seen and examined the pt and agree with PA-Riebock's progress note. PT consult to mobilzie Change Pain rx to dilaudid Ostomy patent Midline wound looks good

## 2013-10-03 DIAGNOSIS — A419 Sepsis, unspecified organism: Secondary | ICD-10-CM

## 2013-10-03 LAB — CBC
HCT: 27.5 % — ABNORMAL LOW (ref 39.0–52.0)
HEMOGLOBIN: 8.7 g/dL — AB (ref 13.0–17.0)
MCH: 30.1 pg (ref 26.0–34.0)
MCHC: 31.6 g/dL (ref 30.0–36.0)
MCV: 95.2 fL (ref 78.0–100.0)
Platelets: 265 10*3/uL (ref 150–400)
RBC: 2.89 MIL/uL — AB (ref 4.22–5.81)
RDW: 21.2 % — ABNORMAL HIGH (ref 11.5–15.5)
WBC: 15.8 10*3/uL — ABNORMAL HIGH (ref 4.0–10.5)

## 2013-10-03 LAB — RENAL FUNCTION PANEL
ALBUMIN: 1.5 g/dL — AB (ref 3.5–5.2)
BUN: 21 mg/dL (ref 6–23)
CALCIUM: 7.3 mg/dL — AB (ref 8.4–10.5)
CO2: 22 mEq/L (ref 19–32)
CREATININE: 2.92 mg/dL — AB (ref 0.50–1.35)
Chloride: 99 mEq/L (ref 96–112)
GFR calc Af Amer: 24 mL/min — ABNORMAL LOW (ref >90)
GFR calc non Af Amer: 20 mL/min — ABNORMAL LOW (ref >90)
Glucose, Bld: 94 mg/dL (ref 70–99)
PHOSPHORUS: 4 mg/dL (ref 2.3–4.6)
Potassium: 3.4 mEq/L — ABNORMAL LOW (ref 3.7–5.3)
Sodium: 137 mEq/L (ref 137–147)

## 2013-10-03 LAB — GLUCOSE, CAPILLARY
GLUCOSE-CAPILLARY: 87 mg/dL (ref 70–99)
GLUCOSE-CAPILLARY: 94 mg/dL (ref 70–99)
Glucose-Capillary: 98 mg/dL (ref 70–99)
Glucose-Capillary: 111 mg/dL — ABNORMAL HIGH (ref 70–99)
Glucose-Capillary: 91 mg/dL (ref 70–99)

## 2013-10-03 LAB — FERRITIN: Ferritin: 803 ng/mL — ABNORMAL HIGH (ref 22–322)

## 2013-10-03 LAB — IRON AND TIBC
Iron: 17 ug/dL — ABNORMAL LOW (ref 42–135)
Saturation Ratios: 27 % (ref 20–55)
TIBC: 63 ug/dL — ABNORMAL LOW (ref 215–435)
UIBC: 46 ug/dL — ABNORMAL LOW (ref 125–400)

## 2013-10-03 MED ORDER — PENTAFLUOROPROP-TETRAFLUOROETH EX AERO: 1.0000 "application " | INHALATION_SPRAY | CUTANEOUS | Status: DC | PRN

## 2013-10-03 MED ORDER — ENSURE COMPLETE PO LIQD: 237.0000 mL | Freq: Two times a day (BID) | ORAL | Status: DC

## 2013-10-03 MED ORDER — SODIUM CHLORIDE 0.9 % IV SOLN: 100.0000 mL | INTRAVENOUS | Status: DC | PRN

## 2013-10-03 MED ORDER — PANTOPRAZOLE SODIUM 40 MG PO TBEC
40.0000 mg | DELAYED_RELEASE_TABLET | Freq: Every day | ORAL | Status: DC
  Administered 2013-10-03 – 2013-10-12 (×10): 40 mg via ORAL
  Filled 2013-10-03 (×10): qty 1

## 2013-10-03 MED ORDER — DARBEPOETIN ALFA-POLYSORBATE 60 MCG/0.3ML IJ SOLN
150.0000 ug | INTRAMUSCULAR | Status: DC
  Filled 2013-10-03: qty 0.9

## 2013-10-03 MED ORDER — LIDOCAINE HCL (PF) 1 % IJ SOLN: 5.0000 mL | INTRAMUSCULAR | Status: DC | PRN

## 2013-10-03 MED ORDER — DARBEPOETIN ALFA-POLYSORBATE 150 MCG/0.3ML IJ SOLN
INTRAMUSCULAR | Status: AC
  Administered 2013-10-03: 150 ug
  Filled 2013-10-03: qty 0.3

## 2013-10-03 MED ORDER — ALTEPLASE 2 MG IJ SOLR
2.0000 mg | Freq: Once | INTRAMUSCULAR | Status: DC | PRN
  Filled 2013-10-03: qty 2

## 2013-10-03 MED ORDER — OXYCODONE-ACETAMINOPHEN 5-325 MG PO TABS
1.0000 | ORAL_TABLET | ORAL | Status: DC | PRN
  Administered 2013-10-03 – 2013-10-04 (×2): 2 via ORAL
  Administered 2013-10-04: 1 via ORAL
  Filled 2013-10-03: qty 1
  Filled 2013-10-03 (×2): qty 2

## 2013-10-03 MED ORDER — DOXERCALCIFEROL 4 MCG/2ML IV SOLN
INTRAVENOUS | Status: AC
  Administered 2013-10-03: 2 ug via INTRAVENOUS
  Filled 2013-10-03: qty 2

## 2013-10-03 MED ORDER — HEPARIN SODIUM (PORCINE) 1000 UNIT/ML DIALYSIS: 1000.0000 [IU] | INTRAMUSCULAR | Status: DC | PRN

## 2013-10-03 MED ORDER — LIDOCAINE-PRILOCAINE 2.5-2.5 % EX CREA
1.0000 "application " | TOPICAL_CREAM | CUTANEOUS | Status: DC | PRN
  Filled 2013-10-03: qty 5

## 2013-10-03 MED ORDER — NEPRO/CARBSTEADY PO LIQD
237.0000 mL | ORAL | Status: DC | PRN
  Filled 2013-10-03: qty 237

## 2013-10-03 MED ORDER — DARBEPOETIN ALFA-POLYSORBATE 60 MCG/0.3ML IJ SOLN
INTRAMUSCULAR | Status: AC
  Filled 2013-10-03: qty 0.3

## 2013-10-03 NOTE — Progress Notes (Signed)
Patient ID: Nicholas [name], male   DOB: [birth year], 73 y.o.   MRN: [id number]  Subjective: Pain is better.  Tolerated clears.    Objective:  Vital signs:  Filed Vitals:   10/03/13 0600 10/03/13 0700 10/03/13 0741 10/03/13 0800  BP: 100/58 122/72  118/70  Pulse: 95 94  89  Temp:   97.4 F (36.3 C)   TempSrc:   Oral   Resp: $Remo'9 14  20  'fRqYd$ Height:      Weight:      SpO2: 91% 96%  97%    Last BM Date: 10/02/13  Intake/Output   Yesterday:  02/15 0701 - 02/16 0700 In: 980 [P.O.:120; I.V.:480; NG/GT:30; IV Piggyback:350] Out: 280 [Emesis/NG output:30; Stool:250] This shift:  Total I/O In: 80 [P.O.:60; I.V.:20] Out: -   Physical Exam:  General: Pt awake/alert/ on vent. NAD  Abdomen: Soft. Nondistended. Appropriately tender. Midline wound is clean, beefy red.  Stoma does not appear necrosed, stool in ostomy.       Problem List:   Principal Problem:   Septic shock(785.52) Active Problems:   ESRD (end stage renal disease) on dialysis   DM (diabetes mellitus), type 2 with renal complications   Ischemic cardiomyopathy   CAD (coronary artery disease)   Intractable low back pain   Systolic CHF   L1 vertebral fracture   Protein-calorie malnutrition, severe   Small bowel obstruction   HCAP (healthcare-associated pneumonia)   Hypokalemia   Severe sepsis(995.92)   Peritonitis    Results:   Labs: Results for orders placed during the hospital encounter of 09/25/13 (from the past 48 hour(s))  GLUCOSE, CAPILLARY     Status: None   Collection Time    10/01/13 12:12 PM      Result Value Ref Range   Glucose-Capillary 87  70 - 99 mg/dL  GLUCOSE, CAPILLARY     Status: None   Collection Time    10/01/13  3:45 PM      Result Value Ref Range   Glucose-Capillary 77  70 - 99 mg/dL  GLUCOSE, CAPILLARY     Status: None   Collection Time    10/01/13  8:14 PM      Result Value Ref Range   Glucose-Capillary 80  70 - 99 mg/dL  GLUCOSE, CAPILLARY     Status: None   Collection  Time    10/02/13 12:32 AM      Result Value Ref Range   Glucose-Capillary 86  70 - 99 mg/dL   Comment 1 Documented in Chart     Comment 2 Notify RN    CBC     Status: Abnormal   Collection Time    10/02/13  3:57 AM      Result Value Ref Range   WBC 15.4 (*) 4.0 - 10.5 K/uL   RBC 2.68 (*) 4.22 - 5.81 MIL/uL   Hemoglobin 8.1 (*) 13.0 - 17.0 g/dL   HCT 25.3 (*) 39.0 - 52.0 %   MCV 94.4  78.0 - 100.0 fL   MCH 30.2  26.0 - 34.0 pg   MCHC 32.0  30.0 - 36.0 g/dL   RDW 20.7 (*) 11.5 - 15.5 %   Platelets 212  150 - 400 K/uL  BASIC METABOLIC PANEL     Status: Abnormal   Collection Time    10/02/13  3:57 AM      Result Value Ref Range   Sodium 140  137 - 147 mEq/L   Potassium 3.1 (*) 3.7 -  5.3 mEq/L   Comment: DELTA CHECK NOTED   Chloride 102  96 - 112 mEq/L   CO2 22  19 - 32 mEq/L   Glucose, Bld 93  70 - 99 mg/dL   BUN 14  6 - 23 mg/dL   Comment: DELTA CHECK NOTED   Creatinine, Ser 2.17 (*) 0.50 - 1.35 mg/dL   Comment: DELTA CHECK NOTED   Calcium 7.0 (*) 8.4 - 10.5 mg/dL   GFR calc non Af Amer 29 (*) >90 mL/min   GFR calc Af Amer 34 (*) >90 mL/min   Comment: (NOTE)     The eGFR has been calculated using the CKD EPI equation.     This calculation has not been validated in all clinical situations.     eGFR's persistently <90 mL/min signify possible Chronic Kidney     Disease.  GLUCOSE, CAPILLARY     Status: None   Collection Time    10/02/13  4:12 AM      Result Value Ref Range   Glucose-Capillary 87  70 - 99 mg/dL  GLUCOSE, CAPILLARY     Status: None   Collection Time    10/02/13  7:47 AM      Result Value Ref Range   Glucose-Capillary 85  70 - 99 mg/dL  GLUCOSE, CAPILLARY     Status: None   Collection Time    10/02/13 12:01 PM      Result Value Ref Range   Glucose-Capillary 88  70 - 99 mg/dL  GLUCOSE, CAPILLARY     Status: None   Collection Time    10/02/13  4:41 PM      Result Value Ref Range   Glucose-Capillary 82  70 - 99 mg/dL  GLUCOSE, CAPILLARY     Status:  None   Collection Time    10/02/13  7:44 PM      Result Value Ref Range   Glucose-Capillary 79  70 - 99 mg/dL  GLUCOSE, CAPILLARY     Status: None   Collection Time    10/02/13 11:16 PM      Result Value Ref Range   Glucose-Capillary 86  70 - 99 mg/dL  GLUCOSE, CAPILLARY     Status: None   Collection Time    10/03/13  3:23 AM      Result Value Ref Range   Glucose-Capillary 87  70 - 99 mg/dL  RENAL FUNCTION PANEL     Status: Abnormal   Collection Time    10/03/13  4:00 AM      Result Value Ref Range   Sodium 137  137 - 147 mEq/L   Potassium 3.4 (*) 3.7 - 5.3 mEq/L   Chloride 99  96 - 112 mEq/L   CO2 22  19 - 32 mEq/L   Glucose, Bld 94  70 - 99 mg/dL   BUN 21  6 - 23 mg/dL   Creatinine, Ser 2.92 (*) 0.50 - 1.35 mg/dL   Calcium 7.3 (*) 8.4 - 10.5 mg/dL   Phosphorus 4.0  2.3 - 4.6 mg/dL   Albumin 1.5 (*) 3.5 - 5.2 g/dL   GFR calc non Af Amer 20 (*) >90 mL/min   GFR calc Af Amer 24 (*) >90 mL/min   Comment: (NOTE)     The eGFR has been calculated using the CKD EPI equation.     This calculation has not been validated in all clinical situations.     eGFR's persistently <90 mL/min signify possible Chronic Kidney  Disease.  CBC     Status: Abnormal   Collection Time    10/03/13  4:00 AM      Result Value Ref Range   WBC 15.8 (*) 4.0 - 10.5 K/uL   RBC 2.89 (*) 4.22 - 5.81 MIL/uL   Hemoglobin 8.7 (*) 13.0 - 17.0 g/dL   HCT 74.9 (*) 35.5 - 21.7 %   MCV 95.2  78.0 - 100.0 fL   MCH 30.1  26.0 - 34.0 pg   MCHC 31.6  30.0 - 36.0 g/dL   RDW 47.1 (*) 59.5 - 39.6 %   Platelets 265  150 - 400 K/uL  GLUCOSE, CAPILLARY     Status: None   Collection Time    10/03/13  7:43 AM      Result Value Ref Range   Glucose-Capillary 98  70 - 99 mg/dL    Imaging / Studies: Dg Abd Portable 1v  10/02/2013   CLINICAL DATA:  Evaluate for resolution of ileus  EXAM: PORTABLE ABDOMEN - 1 VIEW  COMPARISON:  DG ABD PORTABLE 1V dated 10/01/2013; DG ABD PORTABLE 2V dated 09/29/2013; CT ABD/PELV WO CM  dated 09/25/2013  FINDINGS: There is a paucity of bowel gas. There is persistent moderate gas distention of several mildly dilated loops of rather featureless appearing small bowel within the left mid hemi abdomen though there is decreased gaseous distention of these bowel loops. A minimal amount of air is seen within the ascending and descending colon.  Nondiagnostic evaluation pneumoperitoneum. An enteric tube and side port overlie the expected location of the mid body of the stomach.  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 Post bilateral total hip replacements, incompletely imaged.  IMPRESSION: 1. Findings most suggestive of improving ileus, though note, there is persistent mild gas distention of several featureless loops of small bowel within the mid abdomen. Clinical correlation is advised. 2. Sequela of ankylosing spondylitis   Electronically Signed   By: Simonne Come M.D.   On: 10/02/2013 08:21   Dg Abd Portable 1v  10/01/2013   CLINICAL DATA:  Distension.  Recent hemicolectomy with colostomy.  EXAM: PORTABLE ABDOMEN - 1 VIEW  COMPARISON:  09/29/2013 and 09/28/2013  FINDINGS: Central venous catheter unchanged with tip over the right atrium. Nasogastric tube is present with tip over the midline upper abdomen likely over the distal stomach. There is continued evidence of multiple air-filled dilated small bowel loops. There is minimal air within the colon. There is continued evidence of an unusual air collection in the right upper quadrant as cannot exclude free peritoneal air as seen on the prior study. Remainder of the exam is unchanged.  IMPRESSION: Persistent air-filled dilated small bowel loops with paucity of colonic gas. Findings suggesting small bowel obstructive process. Persistent unusual air collection over the right upper quadrant as this may represent free peritoneal air as seen previously likely due to patient's recent hemicolectomy.  Nasogastric  tube with tip over the mid abdomen likely in the distal stomach.  Findings discussed with patient's nurse, Lyla Son, at the time of dictation.   Electronically Signed   By: Elberta Fortis M.D.   On: 10/01/2013 14:56    Medications / Allergies: per chart  Antibiotics: Anti-infectives   Start     Dose/Rate Route Frequency Ordered Stop   09/30/13 1400  ertapenem (INVANZ) 0.5 g in sodium chloride 0.9 % 50 mL IVPB     500 mg 100 mL/hr over 30 Minutes Intravenous Every 24 hours 09/29/13  2202     09/29/13 1415  ertapenem (INVANZ) 1 g in sodium chloride 0.9 % 50 mL IVPB     1 g 100 mL/hr over 30 Minutes Intravenous  Once 09/29/13 1159 09/29/13 1412   09/29/13 0600  cefUROXime (ZINACEF) 1.5 g in dextrose 5 % 50 mL IVPB  Status:  Discontinued     1.5 g 100 mL/hr over 30 Minutes Intravenous On call to O.R. 09/28/13 1711 09/29/13 2203   09/28/13 1200  ceFAZolin (ANCEF) IVPB 2 g/50 mL premix    Comments:  Tube to 17   2 g 100 mL/hr over 30 Minutes Intravenous  Once 09/28/13 1155 09/28/13 1246   09/27/13 1200  vancomycin (VANCOCIN) IVPB 1000 mg/200 mL premix  Status:  Discontinued     1,000 mg 200 mL/hr over 60 Minutes Intravenous Every M-W-F (Hemodialysis) 09/26/13 1638 09/26/13 1721   09/26/13 1745  metroNIDAZOLE (FLAGYL) IVPB 500 mg     500 mg 100 mL/hr over 60 Minutes Intravenous Every 8 hours 09/26/13 1722     09/26/13 1200  vancomycin (VANCOCIN) IVPB 750 mg/150 ml premix  Status:  Discontinued     750 mg 150 mL/hr over 60 Minutes Intravenous Every M-W-F (Hemodialysis) 09/25/13 2344 09/26/13 1051   09/26/13 1200  vancomycin (VANCOCIN) IVPB 1000 mg/200 mL premix  Status:  Discontinued     1,000 mg 200 mL/hr over 60 Minutes Intravenous Every M-W-F (Hemodialysis) 09/26/13 1052 09/26/13 1638   09/26/13 0600  piperacillin-tazobactam (ZOSYN) IVPB 2.25 g  Status:  Discontinued     2.25 g 100 mL/hr over 30 Minutes Intravenous 3 times per day 09/25/13 2126 09/26/13 1721   09/26/13 0000  vancomycin  (VANCOCIN) 500 mg in sodium chloride irrigation 0.9 % 100 mL ENEMA  Status:  Discontinued     500 mg Rectal 4 times per day 09/25/13 2337 09/26/13 1722   09/25/13 2130  vancomycin (VANCOCIN) IVPB 750 mg/150 ml premix     750 mg 150 mL/hr over 60 Minutes Intravenous  Once 09/25/13 2124 09/26/13 0145   09/25/13 2000  piperacillin-tazobactam (ZOSYN) IVPB 3.375 g     3.375 g 100 mL/hr over 30 Minutes Intravenous  Once 09/25/13 1853 09/25/13 2002   09/25/13 2000  vancomycin (VANCOCIN) IVPB 1000 mg/200 mL premix     1,000 mg 200 mL/hr over 60 Minutes Intravenous  Once 09/25/13 1859 09/25/13 2113       Assessment/Plan  Recent back fx  ESRD on HD  CAD  DM  SBO 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 Grandville Silos 09/29/13  POD#4  -colonoscopy biopsy report shows adenocarcinoma.  Surgical pathology pending -advance to full liquid diet.  -continue antibiotics. Indication: perforated bowel, c diff. invanz D#4/7 Flagyl D#7  -monitor stoma -IS -Mobilize -stable for transfer to floor from surgical standpoint.  Erby Pian, [facility] Surgery Pager [phone] Office [phone]  10/03/2013 8:33 AM

## 2013-10-03 NOTE — Progress Notes (Signed)
PULMONARY / CRITICAL CARE MEDICINE  Name: Nicholas [name] MRN: [id number] DOB: May 21, 1941    ADMISSION DATE:  09/25/2013 CONSULTATION DATE:  10/03/2013  REFERRING MD :  [facility] PRIMARY SERVICE: PCCM  CHIEF COMPLAINT:  Post op on vent s/p ex lap for SBO.   BRIEF PATIENT DESCRIPTION: 73 yo white male with end-stage renal disease status post exploratory laparotomy for small bowel obstruction.  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  The patient with right colectomy and repair of descending and sigmoid serosal tears and ileostomy placement.  The patient is on vasopressor support postop mechanical ventilation and critical care was consult   LINES / TUBES: Rt IJ tunneled HD cath 2/12 >>  ETT 2/12 >> 2/13 Rt radial aline 2/12 >> 2/15 Lt IJ CVL 2/12 >> 2/15  CULTURES:  ANTIBIOTICS invanz 2/12     SUBJECTIVE/OVERNIGHT/INTERVAL HX No complaints. No distress  VITAL SIGNS: Temp:  [97.2 F (36.2 C)-97.9 F (36.6 C)] 97.6 F (36.4 C) (02/16 1346) Pulse Rate:  [82-110] 95 (02/16 1500) Resp:  [8-20] 12 (02/16 1346) BP: (71-139)/(49-82) 90/55 mmHg (02/16 1500) SpO2:  [86 %-99 %] 99 % (02/16 1346) Weight:  [68.1 kg (150 lb 2.1 oz)] 68.1 kg (150 lb 2.1 oz) (02/16 1346) HEMODYNAMICS: CVP:  [3 mmHg-9 mmHg] 9 mmHg VENTILATOR SETTINGS:   INTAKE / OUTPUT: Intake/Output     02/15 0701 - 02/16 0700 02/16 0701 - 02/17 0700   P.O. 120 360   I.V. (mL/kg) 480 (6.7) 120 (1.8)   NG/GT 30    IV Piggyback 350 100   Total Intake(mL/kg) 980 (13.8) 580 (8.5)   Emesis/NG output 30    Other     Stool 250    Total Output 280     Net +700 +580          PHYSICAL EXAMINATION: General:  NAD Neuro:  Awake and alert HEENT:  WNL Cardiovascular:  RRR s M Lungs:  clear Abdomen:  Sodft, NT, +BS Ext: warm, no edema  LABS:  I have reviewed all of today's lab results. Relevant abnormalities are discussed in the A/P  section  CXR: NNF  ASSESSMENT / PLAN:  PULMONARY A:  Acute resp failure, resolved  P:   Cont supplemental oxygen PRN   CARDIOVASCULAR A:  Septic shock from peritonitis and recent C diff, resolved Hx of CAD, A fib. Chronic hypotension P:  Cont midodrine -holding amiodarone, ASA  RENAL A:   End-stage renal disease P:   HD per renal service  GASTROINTESTINAL A:   P lap for perforation of colon with colonic mass Recent C difficile colitis. Protein calorie malnutrition. P:   Cont PPI (takes chronically Post op mgmt and nutrition per [facility]  HEMATOLOGIC A:   Acute blood loss anemia after surgery. 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 P:  -f/u CBC -transfuse for Hb < 7 -SCD for DVT prevention  INFECTIOUS A:   Severe sepsis. Recent C diff. P:   Micro and abx reviewed  ENDOCRINE A:   DM 2, controlled - not requiring coverage P:   Change CBGs to q 8 hrs DC SSI - resume for glu > 180  NEUROLOGIC A:  Postoperative pain. Hx of Anxiety/depression. P:   -prn fentanyl -restart lidoderm patch -hold neurotin  Transfer to med-surg. TRH to resume care as of 2/17 AM and PCCM to sign off. Discussed with Dr Nanci Pina  [name] Garnet, MD ; [facility] [phone].  After 5:30 PM or weekends, call [phone]

## 2013-10-03 NOTE — Progress Notes (Signed)
Patient transferred to hemodialysis via bed.  Patient's central line has been removed; patient ambulated to chair this AM and patient bathed prior to transfer.  All patient belongings, chart and meds transferred with patient to hemodialysis as patient is to be transferred to [place] following dialysis.  Patient transferred alert, oriented and VSS.  Report called to receiving nurse on [place], [place]

## 2013-10-03 NOTE — Progress Notes (Signed)
Physical Therapy Treatment Patient Details Name: Nicholas [name] MRN: [id number] DOB: 1940-11-03 Today's Date: 10/03/2013 Time: 6295-2841 PT Time Calculation (min): 12 min  PT Assessment / Plan / Recommendation  History of Present Illness 73 year old male with history of end-stage renal disease on dialysis (M., W., F.) , significant CAD with ischemic cardiomyopathy and CABG in June 2014, EF of 50% as per last echo, diabetes mellitus, hypertension, A. fib on amiodarone (not on anticoagulation secondary to history of GI bleed ), basilar neuropathy, depression, osteoporosis, COPD, who was admitted to [facility] about 2 weeks back for L1 compression fracture. He was managed nonsurgically  by neurosurgery while in the hospital and recommended PT/OT and was discharged to a skilled nursing facility. He was also diagnosed to have C. difficile and was started on oral vancomycin for 2 weeks course which he was supposed to complete yesterday. Patient reports that for 3-4 days  he has not had a bowel movement. His diarrhea had subsided a few days prior to that. He also reports that he was taking at least 3 tablets of oxycodone every day for his back pain since discharge from the hospital. Her last 3 days he has been feeling nauseous and vomiting almost every time he tries to eat. He denies abdominal pain but feels  bloated with his abdomen distended. Patient denies headache, dizziness, fever, chills, chest pain, palpitations, SOB, cough or blood in stool. Denies change in weight or appetite. Denies history of bowel surgery.   PT Comments   Pt unable to participate well with mobility due to pain which makes pt present as profoundly weak.  Will need to determine if pt is able to continue mobility without the spinal brace.  Follow Up Recommendations  SNF     Does the patient have the potential to tolerate intense rehabilitation     Barriers to Discharge Decreased caregiver support      Equipment  Recommendations  None recommended by PT    Recommendations for Other Services    Frequency Min 3X/week   Progress towards PT Goals Progress towards PT goals: Not progressing toward goals - comment (due to excessive pain)  Plan Current plan remains appropriate    Precautions / Restrictions Precautions Precautions: Back;Fall Required Braces or Orthoses:  (discussed possible need for spinal brace)   Pertinent Vitals/Pain 10/10 LBP,  HR increasing to the 120/130's    Mobility  Bed Mobility Overal bed mobility: Needs Assistance Bed Mobility: Sit to Sidelying Rolling: Total assist Sidelying to sit: Max assist Sit to sidelying: Max assist;+2 for physical assistance General bed mobility comments: cuing for technique and significant truncal assist Transfers Overall transfer level: Needs assistance Equipment used: Rolling walker (2 wheeled);None Transfers: Squat Pivot Transfers Sit to Stand: Total assist;+2 physical assistance Squat pivot transfers: Total assist General transfer comment: needs 2 person assist for safety, but not available.  significant lift and directional assist    Exercises     PT Diagnosis: Abnormality of gait;Generalized weakness;Acute pain  PT Problem List: Decreased strength;Decreased activity tolerance;Decreased balance;Decreased mobility;Decreased coordination;Decreased knowledge of precautions;Cardiopulmonary status limiting activity;Pain PT Treatment Interventions: DME instruction;Gait training;Functional mobility training;Therapeutic activities;Balance training;Patient/family education   PT Goals (current goals can now be found in the care plan section) Acute Rehab PT Goals Patient Stated Goal: Pt wants his pain to go away  I need more strength PT Goal Formulation: With patient Time For Goal Achievement: 10/17/13 Potential to Achieve Goals: Fair  Visit Information  Last PT Received On: 10/03/13 Assistance Needed: +  2 History of Present Illness:  73 year old male with history of end-stage renal disease on dialysis (M., W., F.) , significant CAD with ischemic cardiomyopathy and CABG in June 2014, EF of 50% as per last echo, diabetes mellitus, hypertension, A. fib on amiodarone (not on anticoagulation secondary to history of GI bleed ), basilar neuropathy, depression, osteoporosis, COPD, who was admitted to [facility] about 2 weeks back for L1 compression fracture. He was managed nonsurgically  by neurosurgery while in the hospital and recommended PT/OT and was discharged to a skilled nursing facility. He was also diagnosed to have C. difficile and was started on oral vancomycin for 2 weeks course which he was supposed to complete yesterday. Patient reports that for 3-4 days  he has not had a bowel movement. His diarrhea had subsided a few days prior to that. He also reports that he was taking at least 3 tablets of oxycodone every day for his back pain since discharge from the hospital. Her last 3 days he has been feeling nauseous and vomiting almost every time he tries to eat. He denies abdominal pain but feels  bloated with his abdomen distended. Patient denies headache, dizziness, fever, chills, chest pain, palpitations, SOB, cough or blood in stool. Denies change in weight or appetite. Denies history of bowel surgery.    Subjective Data  Subjective: I'm sorry.Marland KitchenMarland KitchenMarland KitchenI was much less painful at [facility] Patient Stated Goal: Pt wants his pain to go away  I need more strength   Cognition  Cognition Arousal/Alertness: Awake/alert Behavior During Therapy: Flat affect Overall Cognitive Status: Impaired/Different from baseline Problem Solving: Slow processing    Balance  Balance Overall balance assessment: Needs assistance Sitting-balance support: Feet supported Sitting balance-Leahy Scale: Poor Sitting balance - Comments: leans posterior at times Postural control: Posterior lean Standing balance support: Bilateral upper extremity supported;During  functional activity Standing balance-Leahy Scale: Poor  End of Session PT - End of Session Equipment Utilized During Treatment: Gait belt Activity Tolerance: Patient limited by pain Patient left: in bed;with call bell/phone within reach;with nursing/sitter in room Nurse Communication: Mobility status;Need for lift equipment   GP     Allye Hoyos, Tessie Fass 10/03/2013, 1:50 PM 10/03/2013  Donnella Sham, [place] [phone]  (pager)

## 2013-10-03 NOTE — Progress Notes (Signed)
[facility] Progress Note  Subjective:   "Sore all over".  On full liquids, but vomited his lunch. Denies nausea prior to the event.   Objective Filed Vitals:   10/03/13 1300 10/03/13 1346 10/03/13 1400 10/03/13 1427  BP: 107/72 101/62 95/61 71/49   Pulse: 104 96 95   Temp:  97.6 F (36.4 C)    TempSrc:  Oral    Resp: 15 12    Height:      Weight:  68.1 kg (150 lb 2.1 oz)    SpO2: 95% 99%     Physical Exam General: Weak, NAD Heart: RRR, no murmur or rub Lungs: CTA bilat, no wheezes or rhonchi Abdomen: Soft, diffusely tender to light palp, +BS, RLQ ostomy with stool Extremities: No LE edema Dialysis Access: R I-J TDC in use. LFA AVF no thrill/bruit  Dialysis: [facility]  4h 69kg 2K//2.25Bath Heparin 5000 LUE AVF (failed now, has R IJ cath)  Hectorol 2 Epo 5000 Venofer none  Last labs: Hb 9.9 last pth 296  Assessment/Plan: 1. 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 No heparin HD 2. ESRD - HD on MWF @ [place], K 3.4. HD today with added K+ bath 3. HD access - using right TDC. LFA AVF unusable due to inflow failure. VVS consulted for permanent access. New LUA AVF on hold pending improvement in current issues. 4. HTN/Volume - SBPs 90's on Midodrine; Under edw by ~1kg per wgts. UF 1-2L as tolerated 5. Anemia - Hgb 8.7. Aranesp increased to 150 q Mon. Fe load completed op on 1/21 for Tsat 17%. Recheck Fe studies. Follow CBC  6. Sec HPT - Ca 7.3 (9.3 corrected), Phos 4.0. Binders previously held, will resume once eating better. Cont Vit D 7. Malnutrition - full liquids, ONS. Some nausea today, hold multivitamin for now. 8. L1 vertebral fx/Ankylosing spondylitis - sec to fall, managed non-operatively. 9. Hx CAD/A-fib - no anticoagulation sec to Hx GIB.  Collene Leyden. Cletus Gash, PA-C [facility] Pager [phone] 10/03/2013,2:38 PM  LOS: 8 days    Renal Attending Agree with above without changes needed.  Pt tolerating hemodialysis without hemodynamic changes. Meisha Salone C    Additional Objective Labs: Basic Metabolic Panel:  Recent Labs Lab 09/27/13 0500 09/28/13 0738  10/01/13 0400 10/02/13 0357 10/03/13 0400  NA 140 139  < > 135* 140 137  K 4.4 4.0  < > 4.2 3.1* 3.4*  CL 97 98  < > 100 102 99  CO2 23 20  < > 20 22 22   GLUCOSE 84 92  < > 109* 93 94  BUN 58* 44*  < > 34* 14 21  CREATININE 6.29* 5.07*  < > 3.95* 2.17* 2.92*  CALCIUM 7.3* 7.3*  < > 6.9* 7.0* 7.3*  PHOS 5.4* 4.6  --   --   --  4.0  < > = values in this interval not displayed. Liver Function Tests:  Recent Labs Lab 09/27/13 0500 09/28/13 0738 10/03/13 0400  ALBUMIN 1.7* 1.7* 1.5*   CBC:  Recent Labs Lab 09/29/13 0508 09/29/13 2045 10/01/13 0400 10/02/13 0357 10/03/13 0400  WBC 14.9* 31.6* 23.3* 15.4* 15.8*  HGB 9.9* 11.4* 9.1* 8.1* 8.7*  HCT 31.8* 35.7* 28.1* 25.3* 27.5*  MCV 94.1 94.2 94.3 94.4 95.2  PLT 325 359 254 212 265   Blood Culture    Component Value Date/Time   SDES BLOOD RIGHT HAND  02/16/2013 2116   SPECREQUEST BOTTLES DRAWN AEROBIC AND ANAEROBIC 10CC 02/16/2013 2116   CULT NO GROWTH 5 DAYS 02/16/2013 2116   REPTSTATUS 02/23/2013 FINAL 02/16/2013 2116    CBG:  Recent Labs Lab 10/02/13 1944 10/02/13 2316 10/03/13 0323 10/03/13 0743 10/03/13 1142  GLUCAP 79 86 87 98 111*   Studies/Results: Dg Abd Portable 1v  10/02/2013   CLINICAL DATA:  Evaluate for resolution of ileus  EXAM: PORTABLE ABDOMEN - 1 VIEW  COMPARISON:  DG ABD PORTABLE 1V dated 10/01/2013; DG ABD PORTABLE 2V dated 09/29/2013; CT ABD/PELV WO CM dated 09/25/2013  FINDINGS: There is a paucity of bowel gas. There is persistent moderate gas distention of several mildly dilated loops of rather featureless appearing small bowel within the left mid hemi abdomen though there is decreased gaseous distention of these bowel loops. A minimal amount of air is seen within  the ascending and descending colon.  Nondiagnostic evaluation pneumoperitoneum. An enteric tube and side port overlie the expected location of the mid body of the stomach.  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 Post bilateral total hip replacements, incompletely imaged.  IMPRESSION: 1. Findings most suggestive of improving ileus, though note, there is persistent mild gas distention of several featureless loops of small bowel within the mid abdomen. Clinical correlation is advised. 2. Sequela of ankylosing spondylitis   Electronically Signed   By: Sandi Mariscal M.D.   On: 10/02/2013 08:21   Medications: . sodium chloride Stopped (10/03/13 1300)   . antiseptic oral rinse  1 application Mouth Rinse QID  . calcitonin (salmon)  1 spray Alternating Nares Daily  . chlorhexidine  15 mL Mouth/Throat BID  . darbepoetin      . [START ON 10/10/2013] darbepoetin  150 mcg Intravenous Q Mon-HD  . doxercalciferol      . doxercalciferol  2 mcg Intravenous Q M,W,F-HD  . ertapenem  500 mg Intravenous Q24H  . lidocaine  1 patch Transdermal Q24H  . midodrine  10 mg Oral BID WC  . pantoprazole  40 mg Oral Q1200

## 2013-10-03 NOTE — Consult Note (Signed)
WOC ostomy follow up Stoma type/location: new RLQ, end ileostomy Stomal assessment/size:  1 1/4" oval shaped, flush Peristomal assessment: did not assess today Output pasty, brown Ostomy pouching: 2pc. 23/4" in place from bedside nurse, I have placed date of change on this today Education provided: pt ready for some beginning teaching Pt is planning to go back to [facility] at discharged but is very engaged today. I have ordered supplies for next pouch change and provided educational booklet for the patient to begin reviewing  WOC will follow along with you for ostomy care and teaching. Jamareon Shimel Lebam RN,CWOCN [phone]

## 2013-10-03 NOTE — Progress Notes (Signed)
Looks good.  Tolerating diet Ostomy functioning.   Ok to tx to floor Boost shakes.   Nicholas [name]. Redmond Pulling, MD, FACS General, Bariatric, & Minimally Invasive Surgery [facility], [place]

## 2013-10-03 NOTE — Evaluation (Signed)
Physical Therapy Evaluation Patient Details Name: Nicholas [name] MRN: [id number] DOB: [birth year] Today's Date: 10/03/2013 Time: 4401-0272 PT Time Calculation (min): 26 min  PT Assessment / Plan / Recommendation History of Present Illness  73 year old male with history of end-stage renal disease on dialysis (M., W., F.) , significant CAD with ischemic cardiomyopathy and CABG in June 2014, EF of 50% as per last echo, diabetes mellitus, hypertension, A. fib on amiodarone (not on anticoagulation secondary to history of GI bleed ), basilar neuropathy, depression, osteoporosis, COPD, who was admitted to [facility] about 2 weeks back for L1 compression fracture. He was managed nonsurgically  by neurosurgery while in the hospital and recommended PT/OT and was discharged to a skilled nursing facility. He was also diagnosed to have C. difficile and was started on oral vancomycin for 2 weeks course which he was supposed to complete yesterday. Patient reports that for 3-4 days  he has not had a bowel movement. His diarrhea had subsided a few days prior to that. He also reports that he was taking at least 3 tablets of oxycodone every day for his back pain since discharge from the hospital. Her last 3 days he has been feeling nauseous and vomiting almost every time he tries to eat. He denies abdominal pain but feels  bloated with his abdomen distended. Patient denies headache, dizziness, fever, chills, chest pain, palpitations, SOB, cough or blood in stool. Denies change in weight or appetite. Denies history of bowel surgery.  Clinical Impression  Pt admitted with/for abdominal pain found to be due to SBO, s/p exp lap. Also with L 1 compression fx.   Pt currently limited functionally due to the problems listed. ( See problems list.)   Pt will benefit from PT to maximize function and safety in order to get ready for next venue listed below.     PT Assessment  Patient needs continued PT services    Follow Up  Recommendations  SNF    Does the patient have the potential to tolerate intense rehabilitation      Barriers to Discharge Decreased caregiver support      Equipment Recommendations  None recommended by PT    Recommendations for Other Services     Frequency Min 3X/week    Precautions / Restrictions Precautions Precautions: Back;Fall Required Braces or Orthoses:  (Not aware of brace.  Will find and donn in future.)   Pertinent Vitals/Pain HR with exertion max 150's.  Sats 90%       Mobility  Bed Mobility Overal bed mobility: Needs Assistance Bed Mobility: Rolling;Sidelying to Sit Rolling: Total assist Sidelying to sit: Max assist General bed mobility comments: cuing for technique and significant truncal assist Transfers Overall transfer level: Needs assistance Equipment used: Rolling walker (2 wheeled);None Transfers: Sit to/from [employer] Sit to Stand: Total assist;Max assist Squat pivot transfers: Total assist General transfer comment: needs 2 person assist for safety, but not available.  significant lift and directional assist    Exercises     PT Diagnosis: Abnormality of gait;Generalized weakness;Acute pain  PT Problem List: Decreased strength;Decreased activity tolerance;Decreased balance;Decreased mobility;Decreased coordination;Decreased knowledge of precautions;Cardiopulmonary status limiting activity;Pain PT Treatment Interventions: DME instruction;Gait training;Functional mobility training;Therapeutic activities;Balance training;Patient/family education     PT Goals(Current goals can be found in the care plan section) Acute Rehab PT Goals Patient Stated Goal: Pt wants his pain to go away  I need more strength PT Goal Formulation: With patient Time For Goal Achievement: 10/17/13 Potential to Achieve Goals:  Fair  Visit Information  Last PT Received On: 10/03/13 Assistance Needed: +2 History of Present Illness: 73 year old male with  history of end-stage renal disease on dialysis (M., W., F.) , significant CAD with ischemic cardiomyopathy and CABG in June 2014, EF of 50% as per last echo, diabetes mellitus, hypertension, A. fib on amiodarone (not on anticoagulation secondary to history of GI bleed ), basilar neuropathy, depression, osteoporosis, COPD, who was admitted to [facility] about 2 weeks back for L1 compression fracture. He was managed nonsurgically  by neurosurgery while in the hospital and recommended PT/OT and was discharged to a skilled nursing facility. He was also diagnosed to have C. difficile and was started on oral vancomycin for 2 weeks course which he was supposed to complete yesterday. Patient reports that for 3-4 days  he has not had a bowel movement. His diarrhea had subsided a few days prior to that. He also reports that he was taking at least 3 tablets of oxycodone every day for his back pain since discharge from the hospital. Her last 3 days he has been feeling nauseous and vomiting almost every time he tries to eat. He denies abdominal pain but feels  bloated with his abdomen distended. Patient denies headache, dizziness, fever, chills, chest pain, palpitations, SOB, cough or blood in stool. Denies change in weight or appetite. Denies history of bowel surgery.       Prior [place] expects to be discharged to:: [place]: Other (Comment) Type of Home: House Home Access: Stairs to enter Entrance Stairs-Number of Steps: 3 Entrance Stairs-Rails: Right;Left Home Layout: One level Home Equipment: [place] - 2 wheels;Bedside commode Additional Comments: pt is unsafe to d/c home at this time. Pt has no family assistance. Pt must drive himself to HD x3 per week . Pt reports leaving home at 10:30 AM and not returning until after 17:00 PM. Pt with Rt UE in a splint preventing elbow flexion Prior Function Level of Independence: Independent with assistive  device(s) Comments: ambulates with RW inside his home and cane in the community. Pt must drive to HD and all aspects of meal / house care (grocery store, post office etc) Communication Communication: No difficulties Dominant Hand: Right    Cognition  Cognition Arousal/Alertness: Awake/alert Behavior During Therapy: Flat affect Overall Cognitive Status: Impaired/Different from baseline    Extremity/Trunk Assessment Upper Extremity Assessment Upper Extremity Assessment: Generalized weakness Lower Extremity Assessment Lower Extremity Assessment: Generalized weakness (profoundly weak all 4's)   Balance Balance Overall balance assessment: Needs assistance Sitting-balance support: Bilateral upper extremity supported;Feet supported Sitting balance-Leahy Scale: Fair Standing balance support: Bilateral upper extremity supported;During functional activity Standing balance-Leahy Scale: Poor  End of Session PT - End of Session Equipment Utilized During Treatment: Gait belt Activity Tolerance: Patient limited by pain Patient left: in chair;with call bell/phone within reach Nurse Communication: Mobility status  GP     Brantlee Hinde, Tessie Fass 10/03/2013, 12:24 PM 10/03/2013  Donnella Sham, [place] [phone]  (pager)

## 2013-10-04 DIAGNOSIS — C180 Malignant neoplasm of cecum: Secondary | ICD-10-CM | POA: Diagnosis present

## 2013-10-04 DIAGNOSIS — C801 Malignant (primary) neoplasm, unspecified: Secondary | ICD-10-CM

## 2013-10-04 LAB — GLUCOSE, CAPILLARY
GLUCOSE-CAPILLARY: 100 mg/dL — AB (ref 70–99)
GLUCOSE-CAPILLARY: 146 mg/dL — AB (ref 70–99)
GLUCOSE-CAPILLARY: 116 mg/dL — AB (ref 70–99)
Glucose-Capillary: 120 mg/dL — ABNORMAL HIGH (ref 70–99)

## 2013-10-04 LAB — POTASSIUM: POTASSIUM: 3.9 meq/L (ref 3.7–5.3)

## 2013-10-04 LAB — CLOSTRIDIUM DIFFICILE BY PCR: CDIFFPCR: NEGATIVE

## 2013-10-04 MED ORDER — PSYLLIUM 95 % PO PACK
1.0000 | PACK | Freq: Two times a day (BID) | ORAL | Status: DC
  Administered 2013-10-04 – 2013-10-09 (×5): 1 via ORAL
  Administered 2013-10-09: 22:00:00 via ORAL
  Administered 2013-10-10 – 2013-10-12 (×5): 1 via ORAL
  Filled 2013-10-04 (×17): qty 1

## 2013-10-04 MED ORDER — OXYCODONE HCL 5 MG PO TABS
5.0000 mg | ORAL_TABLET | ORAL | Status: DC | PRN
  Administered 2013-10-04: 10 mg via ORAL
  Administered 2013-10-05: 5 mg via ORAL
  Administered 2013-10-05 – 2013-10-06 (×4): 10 mg via ORAL
  Administered 2013-10-07: 5 mg via ORAL
  Administered 2013-10-08 – 2013-10-10 (×5): 10 mg via ORAL
  Administered 2013-10-11: 5 mg via ORAL
  Administered 2013-10-11 – 2013-10-12 (×4): 10 mg via ORAL
  Filled 2013-10-04 (×2): qty 2
  Filled 2013-10-04: qty 1
  Filled 2013-10-04 (×3): qty 2
  Filled 2013-10-04: qty 1
  Filled 2013-10-04 (×2): qty 2
  Filled 2013-10-04: qty 1
  Filled 2013-10-04: qty 2
  Filled 2013-10-04: qty 1
  Filled 2013-10-04 (×7): qty 2

## 2013-10-04 MED ORDER — ACETAMINOPHEN 500 MG PO TABS: 500.0000 mg | ORAL_TABLET | Freq: Three times a day (TID) | ORAL | Status: DC

## 2013-10-04 MED ORDER — NEPRO/CARBSTEADY PO LIQD
237.0000 mL | ORAL | Status: DC
  Administered 2013-10-04 – 2013-10-09 (×4): 237 mL via ORAL

## 2013-10-04 MED ORDER — ACETAMINOPHEN 500 MG PO TABS
1000.0000 mg | ORAL_TABLET | Freq: Three times a day (TID) | ORAL | Status: DC
Start: 2013-10-04 — End: 2013-10-12
  Administered 2013-10-04 – 2013-10-12 (×21): 1000 mg via ORAL
  Filled 2013-10-04 (×30): qty 2

## 2013-10-04 MED ORDER — BISMUTH SUBSALICYLATE 262 MG/15ML PO SUSP
30.0000 mL | Freq: Three times a day (TID) | ORAL | Status: DC | PRN
  Filled 2013-10-04: qty 236

## 2013-10-04 MED ORDER — SODIUM CHLORIDE 0.9 % IV SOLN
62.5000 mg | INTRAVENOUS | Status: DC
  Administered 2013-10-05 – 2013-10-07 (×2): 62.5 mg via INTRAVENOUS
  Filled 2013-10-04 (×2): qty 5

## 2013-10-04 MED ORDER — LOPERAMIDE HCL 2 MG PO CAPS
2.0000 mg | ORAL_CAPSULE | Freq: Three times a day (TID) | ORAL | Status: DC | PRN
  Filled 2013-10-04: qty 2

## 2013-10-04 NOTE — Progress Notes (Signed)
NUTRITION FOLLOW UP  DOCUMENTATION CODES Per approved criteria  -Severe malnutrition in the context of chronic illness   INTERVENTION: Add Nepro Shake po daily, each supplement provides 425 kcal and 19 grams protein. Monitor magnesium, potassium, and phosphorus daily for at least 3 days, MD to replete as needed, as pt is at risk for refeeding syndrome given prolonged inadequate oral intake. RD to follow for nutrition care plan.  NUTRITION DIAGNOSIS: Inadequate oral intake now related to altered GI function as evidenced by limited PO intake.  Goal: Pt to meet >/= 90% of their estimated nutrition needs, unmet  Monitor:  PO diet advancement, weight, labs, I/O's, supplement tolerance  ASSESSMENT: PMHx is significant for ESRD (MWF), CAD s/p CABG, DM, HTN, afib, COPD. Recently discharged from [facility] about 2 weeks ago 2/2 L1 compression fx was discharged to SNF. Pt was also dx with C diff during that time. Admitted now with constipation x 3-4 days, also with n/v every time he tries to eat. Work-up reveals ?c diff colitis and HCAP.  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  Remains on pressors.  Nephrology following. NGT clamped 2/15. Vomited full liquid lunch yesterday. Pt stated after he returned from HD yesterday afternoon he didn't feel like eating. He stated that he didn't eat any breakfast this morning either. Pt reports that he has had Nepro shakes before and is agreeable to receiving. RD will order at this time.  Potassium low at 3.4. Phosphorus WNL.  Height: Ht Readings from Last 1 Encounters:  09/27/13 5\' 6"  (1.676 m)    Weight: Wt Readings from Last 1 Encounters:  10/03/13 149 lb 14.6 oz (68 kg)  Admit wt 163 lb  Estimated Nutritional Needs: Kcal:  2000-2200 Protein: 110-120 gm Fluid: 1.2 liters  Skin: stage II pressure ulcer to sacrum  Diet Order: Full LIquids    Intake/Output Summary (Last 24 hours) at 10/04/13 1028 Last data filed at 10/04/13 0558  Gross per 24 hour  Intake     60 ml  Output      0 ml  Net     60 ml    Last BM: 2/16 via ostomy  Labs:   Recent Labs Lab 09/28/13 0738  10/01/13 0400 10/02/13 0357 10/03/13 0400  NA 139  < > 135* 140 137  K 4.0  < > 4.2 3.1* 3.4*  CL 98  < > 100 102 99  CO2 20  < > 20 22 22   BUN 44*  < > 34* 14 21  CREATININE 5.07*  < > 3.95* 2.17* 2.92*  CALCIUM 7.3*  < > 6.9* 7.0* 7.3*  PHOS 4.6  --   --   --  4.0  GLUCOSE 92  < > 109* 93 94  < > = values in this interval not displayed.  CBG (last 3)   Recent Labs  10/03/13 1828 10/03/13 2039 10/04/13 0820  GLUCAP 91 94 120*    Scheduled Meds: . antiseptic oral rinse  1 application Mouth Rinse QID  . calcitonin (salmon)  1 spray Alternating Nares Daily  . chlorhexidine  15 mL Mouth/Throat BID  . [START ON 10/10/2013] darbepoetin  150 mcg Intravenous Q Mon-HD  . doxercalciferol  2 mcg Intravenous Q M,W,F-HD  . ertapenem  500 mg Intravenous Q24H  . [START  ON 10/07/2013] ferric gluconate (FERRLECIT/NULECIT) IV  62.5 mg Intravenous Q Fri-HD  . lidocaine  1 patch Transdermal Q24H  . midodrine  10 mg Oral BID WC  . pantoprazole  40 mg Oral Q1200    Continuous Infusions: . sodium chloride Stopped (10/03/13 1300)    Inda Coke MS, RD, LDN Inpatient Registered Dietitian Pager: [phone] After-hours pager: [phone]

## 2013-10-04 NOTE — Progress Notes (Signed)
Nicholas [name] [id number] 1941/03/17  CARE TEAM:  PCP: Ramonita Lab  Outpatient Care Team: Patient Care Team: Tama High III as PCP - General (Internal Medicine)  Inpatient Treatment Team: Treatment Team: Attending Provider: Geradine Girt, DO; Consulting Physician: Donetta Potts, MD; Consulting Physician: Mal Misty, MD; Consulting Physician: Cleotis Nipper, MD; Social Worker: Jeanette Caprice, LCSW; Technician: Scot Dock, NT; Consulting Physician: Nolon Nations, MD; Registered Nurse: Nadara Mode, RN; Respiratory Therapist: Isaac Bliss, RRT; Nurse Practitioner: Marlena Clipper, NP; Registered Nurse: Starla Link, RN; Rounding Team: Trenton Gammon, MD   Subjective:  09/29/2013  POST-OPERATIVE DIAGNOSIS: 1. 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 Serosal injuries to the ascending colon x1 and sigmoid colon x2  PROCEDURE: Procedure(s):  RIGHT COLECTOMY  REPAIR DESCENDING AND SIGMOID SEROSAL TEARS  ILEOSTOMY  SURGEON: Surgeon(s):  Zenovia Jarred, MD  ASSISTANTS: Coralie Keens, [facility]   The patient was tolerating full liquids.  He does have pain.  Medicines to help but does not ask for it.  No fevers or chills.  He wants a wheelchair to move around.  Objective:  Vital signs:  Filed Vitals:   10/03/13 1923 10/03/13 2036 10/04/13 0556 10/04/13 0815  BP: 120/71 114/69 109/63 126/76  Pulse: 110 105 106 86  Temp: 97.3 F (36.3 C) 98.1 F (36.7 C) 97.7 F (36.5 C) 97.8 F (36.6 C)  TempSrc: Oral Oral Oral Oral  Resp: _0 Height:      Weight:  149 lb 14.6 oz (68 kg)    SpO2: 93% 95% 98% 100%    Last BM Date: 10/03/13  Intake/Output   Yesterday:  02/16 0701 - 02/17 0700 In: 580 [P.O.:360; I.V.:120; IV Piggyback:100] Out: 0  This shift:     Bowel function:  Flatus: y  BM: thick effluent in bag  Drain: n/a  Physical Exam:  General: Pt awake/alert/oriented x4 in no acute distress Eyes:  PERRL, normal EOM.  Sclera clear.  No icterus Neuro: CN II-XII intact w/o focal sensory/motor deficits. Lymph: No head/neck/groin lymphadenopathy Psych:  No delerium/psychosis/paranoia HENT: Normocephalic, Mucus membranes moist.  No thrush Neck: Supple, No tracheal deviation Chest: No chest wall pain w good excursion CV:  Pulses intact.  Regular rhythm MS: Normal AROM mjr joints.  No obvious deformity Abdomen: Soft.  Nondistended.  Wound clean.  Ileostomy pink w gas/effluent.  No evidence of peritonitis.  No incarcerated hernias. Ext:  SCDs BLE.  No mjr edema.  No cyanosis Skin: No petechiae / purpura   Problem List:   Principal Problem:   Septic shock(785.52) Active Problems:   ESRD (end stage renal disease) on dialysis   DM (diabetes mellitus), type 2 with renal complications   Ischemic cardiomyopathy   CAD (coronary artery disease)   Intractable low back pain   Systolic CHF   L1 vertebral fracture   Protein-calorie malnutrition, severe   Small bowel obstruction   HCAP (healthcare-associated pneumonia)   Hypokalemia   Severe sepsis(995.92)   Peritonitis   Assessment  Nicholas [name]  73 y.o. male  5 Days Post-Op    09/29/2013  POST-OPERATIVE DIAGNOSIS: 1. 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 Serosal injuries to the ascending colon x1 and sigmoid colon x2  PROCEDURE: Procedure(s):  RIGHT COLECTOMY  REPAIR DESCENDING AND SIGMOID SEROSAL TEARS  ILEOSTOMY  SURGEON: Surgeon(s):  Zenovia Jarred, MD  ASSISTANTS: Coralie Keens, PAC     Gradually recovering.  Ileus resolving.  Plan:  Advanced to solid diet.  Improve pain control.  Place on standing tylenol, ice/eating pad.  Oral/IV narcotics for breakthrough  Anti-diarrheal regimen with ileostomy.  Less of an issue in a renal failure patient, but do not want him to get to dehydrated.  More aggressive rehabilitation.  Seems like he likes to stay in bed.  [place]  okay for some traveling but tried to stress the importance of getting up the room itself and working with physical therapy to optimize recovery.  -VTE prophylaxis- SCDs, etc  -f/u path on cecal cancer    [name], M.D., F.A.C.S. Gastrointestinal and Minimally Invasive Surgery [facility] [address], [place], [place] [address] [phone] Main / Paging   10/04/2013   Results:   Labs: Results for orders placed during the hospital encounter of 09/25/13 (from the past 48 hour(s))  GLUCOSE, CAPILLARY     Status: None   Collection Time    10/02/13 12:01 PM      Result Value Ref Range   Glucose-Capillary 88  70 - 99 mg/dL  GLUCOSE, CAPILLARY     Status: None   Collection Time    10/02/13  4:41 PM      Result Value Ref Range   Glucose-Capillary 82  70 - 99 mg/dL  GLUCOSE, CAPILLARY     Status: None   Collection Time    10/02/13  7:44 PM      Result Value Ref Range   Glucose-Capillary 79  70 - 99 mg/dL  GLUCOSE, CAPILLARY     Status: None   Collection Time    10/02/13 11:16 PM      Result Value Ref Range   Glucose-Capillary 86  70 - 99 mg/dL  GLUCOSE, CAPILLARY     Status: None   Collection Time    10/03/13  3:23 AM      Result Value Ref Range   Glucose-Capillary 87  70 - 99 mg/dL  RENAL FUNCTION PANEL     Status: Abnormal   Collection Time    10/03/13  4:00 AM      Result Value Ref Range   Sodium 137  137 - 147 mEq/L   Potassium 3.4 (*) 3.7 - 5.3 mEq/L   Chloride 99  96 - 112 mEq/L   CO2 22  19 - 32 mEq/L   Glucose, Bld 94  70 - 99 mg/dL   BUN 21  6 - 23 mg/dL   Creatinine, Ser 2.92 (*) 0.50 - 1.35 mg/dL   Calcium 7.3 (*) 8.4 - 10.5 mg/dL   Phosphorus 4.0  2.3 - 4.6 mg/dL   Albumin 1.5 (*) 3.5 - 5.2 g/dL   GFR calc non Af Amer 20 (*) >90 mL/min   GFR calc Af Amer 24 (*) >90 mL/min   Comment: (NOTE)     The eGFR has been calculated using the CKD EPI equation.     This calculation has not been validated in all  clinical situations.     eGFR's persistently <90 mL/min signify possible Chronic Kidney     Disease.  CBC     Status: Abnormal   Collection Time    10/03/13  4:00 AM      Result Value Ref Range   WBC 15.8 (*) 4.0 - 10.5 K/uL   RBC 2.89 (*) 4.22 - 5.81 MIL/uL   Hemoglobin 8.7 (*) 13.0 -  17.0 g/dL   HCT 27.5 (*) 39.0 - 52.0 %   MCV 95.2  78.0 - 100.0 fL   MCH 30.1  26.0 - 34.0 pg   MCHC 31.6  30.0 - 36.0 g/dL   RDW 21.2 (*) 11.5 - 15.5 %   Platelets 265  150 - 400 K/uL  GLUCOSE, CAPILLARY     Status: None   Collection Time    10/03/13  7:43 AM      Result Value Ref Range   Glucose-Capillary 98  70 - 99 mg/dL  GLUCOSE, CAPILLARY     Status: Abnormal   Collection Time    10/03/13 11:42 AM      Result Value Ref Range   Glucose-Capillary 111 (*) 70 - 99 mg/dL   Comment 1 Documented in Chart     Comment 2 Notify RN    IRON AND TIBC     Status: Abnormal   Collection Time    10/03/13  2:00 PM      Result Value Ref Range   Iron 17 (*) 42 - 135 ug/dL   TIBC 63 (*) 215 - 435 ug/dL   Saturation Ratios 27  20 - 55 %   UIBC 46 (*) 125 - 400 ug/dL   Comment: Performed at [place]     Status: Abnormal   Collection Time    10/03/13  2:00 PM      Result Value Ref Range   Ferritin 803 (*) 22 - 322 ng/mL   Comment: Performed at [place], CAPILLARY     Status: None   Collection Time    10/03/13  6:28 PM      Result Value Ref Range   Glucose-Capillary 91  70 - 99 mg/dL  GLUCOSE, CAPILLARY     Status: None   Collection Time    10/03/13  8:39 PM      Result Value Ref Range   Glucose-Capillary 94  70 - 99 mg/dL  GLUCOSE, CAPILLARY     Status: Abnormal   Collection Time    10/04/13  8:20 AM      Result Value Ref Range   Glucose-Capillary 120 (*) 70 - 99 mg/dL    Imaging / Studies: No results found.  Medications / Allergies: per chart  Antibiotics: Anti-infectives   Start     Dose/Rate Route Frequency Ordered Stop   09/30/13 1400   ertapenem (INVANZ) 0.5 g in sodium chloride 0.9 % 50 mL IVPB     500 mg 100 mL/hr over 30 Minutes Intravenous Every 24 hours 09/29/13 2202     09/29/13 1415  ertapenem (INVANZ) 1 g in sodium chloride 0.9 % 50 mL IVPB     1 g 100 mL/hr over 30 Minutes Intravenous  Once 09/29/13 1159 09/29/13 1412   09/29/13 0600  cefUROXime (ZINACEF) 1.5 g in dextrose 5 % 50 mL IVPB  Status:  Discontinued     1.5 g 100 mL/hr over 30 Minutes Intravenous On call to O.R. 09/28/13 1711 09/29/13 2203   09/28/13 1200  ceFAZolin (ANCEF) IVPB 2 g/50 mL premix    Comments:  Tube to 17   2 g 100 mL/hr over 30 Minutes Intravenous  Once 09/28/13 1155 09/28/13 1246   09/27/13 1200  vancomycin (VANCOCIN) IVPB 1000 mg/200 mL premix  Status:  Discontinued     1,000 mg 200 mL/hr over 60 Minutes Intravenous Every M-W-F (Hemodialysis) 09/26/13 1638 09/26/13 1721   09/26/13 1745  metroNIDAZOLE (FLAGYL) IVPB 500 mg  Status:  Discontinued     500 mg 100 mL/hr over 60 Minutes Intravenous Every 8 hours 09/26/13 1722 10/03/13 1112   09/26/13 1200  vancomycin (VANCOCIN) IVPB 750 mg/150 ml premix  Status:  Discontinued     750 mg 150 mL/hr over 60 Minutes Intravenous Every M-W-F (Hemodialysis) 09/25/13 2344 09/26/13 1051   09/26/13 1200  vancomycin (VANCOCIN) IVPB 1000 mg/200 mL premix  Status:  Discontinued     1,000 mg 200 mL/hr over 60 Minutes Intravenous Every M-W-F (Hemodialysis) 09/26/13 1052 09/26/13 1638   09/26/13 0600  piperacillin-tazobactam (ZOSYN) IVPB 2.25 g  Status:  Discontinued     2.25 g 100 mL/hr over 30 Minutes Intravenous 3 times per day 09/25/13 2126 09/26/13 1721   09/26/13 0000  vancomycin (VANCOCIN) 500 mg in sodium chloride irrigation 0.9 % 100 mL ENEMA  Status:  Discontinued     500 mg Rectal 4 times per day 09/25/13 2337 09/26/13 1722   09/25/13 2130  vancomycin (VANCOCIN) IVPB 750 mg/150 ml premix     750 mg 150 mL/hr over 60 Minutes Intravenous  Once 09/25/13 2124 09/26/13 0145   09/25/13 2000   piperacillin-tazobactam (ZOSYN) IVPB 3.375 g     3.375 g 100 mL/hr over 30 Minutes Intravenous  Once 09/25/13 1853 09/25/13 2002   09/25/13 2000  vancomycin (VANCOCIN) IVPB 1000 mg/200 mL premix     1,000 mg 200 mL/hr over 60 Minutes Intravenous  Once 09/25/13 1859 09/25/13 2113       Note: This dictation was prepared with Dragon/digital dictation along with [employer]. Any transcriptional errors that result from this process are unintentional.

## 2013-10-04 NOTE — Progress Notes (Signed)
[facility] Progress Note  Subjective:   Belly soreness remains. Seems a little confused this morning. (possibly due to room transfer) Very concerned that he may lose his bed at [facility]  Objective Filed Vitals:   10/03/13 1830 10/03/13 1923 10/03/13 2036 10/04/13 0556  BP: 105/58 120/71 114/69 109/63  Pulse: 107 110 105 106  Temp: 98.1 F (36.7 C) 97.3 F (36.3 C) 98.1 F (36.7 C) 97.7 F (36.5 C)  TempSrc: Oral Oral Oral Oral  Resp: 18 18 18 18   Height:      Weight:   68 kg (149 lb 14.6 oz)   SpO2: 93% 93% 95% 98%   Physical Exam General: Alert, pleasant, a little disoriented relative to situation. NAD Heart: RRR, no murmur or rub Lungs: CTA bilat, no wheezes or rhonchi Abdomen: Soft, appropriately tender to med palp, +BS Extremities: SCD's present, no LE edema Dialysis Access: R I-J TDC,  Failed LFA AVF  Dialysis: [facility]  4h 69kg 2K//2.25Bath Heparin 5000 LUE AVF (failed now, has R IJ cath)  Hectorol 2 Epo 5000 Venofer none  Last labs: Hb 9.9 last pth 296   Assessment/Plan: 1. Bowel obstruction - s/p R colectomy, repair of serosal tears, & ileostomy 2/12. Cecal mass positive for invasive adenocarcinoma, mod differentiated, benign for inflamed proximal and transverse segments per pathology report. Mgmt per surgery. On Invanz. Continue No heparin HD  2. ESRD - HD on MWF @ [place], K 3.4  Recheck and replete prn. 3. HD access - using right TDC. LFA AVF unusable due to inflow failure. VVS consulted for permanent access. New LUA AVF on hold pending improvement in current issues.  4. HTN/Volume - SBPs 100's on Midodrine BID; Under edw by ~1kg per wgts. Kept even on HD yesterday. No overt excess noted. 5. Anemia - Hgb 8.7. Aranesp 150 q Mon. Tsat 27% s/p op Fe load completed 1/21. Ferritin 803. Start weekly Fe. Follow labs, transfuse prn 6. 2HPT - Ca 7.3 (9.3 corrected), Phos 4.0. Binders previously held, will resume once eating better. Cont Vit D   7. Malnutrition - full liquids, ONS. Continue holding multivitamin until tolerating PO.  8. L1 vertebral fx/Ankylosing spondylitis - sec to fall, managed non-operatively.  9. Hx CAD/A-fib - no anticoagulation sec to Hx GIB  Santiago Glad E. Cletus Gash, PA-C [facility] Pager [phone] 10/04/2013,9:45 AM  LOS: 9 days  Renal Attending: He appears very weak today. Will plan for his routine HD tx Wed.  Agree with above note as articulated by Ms. Cletus Gash. Gurjit Loconte C    Additional Objective Labs: Basic Metabolic Panel:  Recent Labs Lab 09/28/13 0738  10/01/13 0400 10/02/13 0357 10/03/13 0400  NA 139  < > 135* 140 137  K 4.0  < > 4.2 3.1* 3.4*  CL 98  < > 100 102 99  CO2 20  < > 20 22 22   GLUCOSE 92  < > 109* 93 94  BUN 44*  < > 34* 14 21  CREATININE 5.07*  < > 3.95* 2.17* 2.92*  CALCIUM 7.3*  < > 6.9* 7.0* 7.3*  PHOS 4.6  --   --   --  4.0  < > = values in this interval not displayed. Liver Function Tests:  Recent Labs Lab 09/28/13 0738 10/03/13 0400  ALBUMIN 1.7* 1.5*   CBC:  Recent Labs Lab 09/29/13 0508 09/29/13 2045 10/01/13 0400 10/02/13 0357 10/03/13 0400  WBC 14.9* 31.6* 23.3* 15.4* 15.8*  HGB 9.9* 11.4* 9.1* 8.1* 8.7*  HCT 31.8* 35.7* 28.1* 25.3* 27.5*  MCV 94.1 94.2 94.3 94.4 95.2  PLT 325 359 254 212 265   Blood Culture    Component Value Date/Time   SDES BLOOD RIGHT HAND 02/16/2013 2116   SPECREQUEST BOTTLES DRAWN AEROBIC AND ANAEROBIC 10CC 02/16/2013 2116   CULT NO GROWTH 5 DAYS 02/16/2013 2116   REPTSTATUS 02/23/2013 FINAL 02/16/2013 2116     CBG:  Recent Labs Lab 10/03/13 0743 10/03/13 1142 10/03/13 1828 10/03/13 2039 10/04/13 0820  GLUCAP 98 111* 91 94 120*   Iron Studies:   Recent Labs  10/03/13 1400  IRON 17*  TIBC 63*  FERRITIN 803*    Studies/Results: No results found.  Medications: . sodium chloride Stopped (10/03/13 1300)   . antiseptic oral rinse  1 application Mouth Rinse QID  . calcitonin (salmon)  1 spray  Alternating Nares Daily  . chlorhexidine  15 mL Mouth/Throat BID  . [START ON 10/10/2013] darbepoetin  150 mcg Intravenous Q Mon-HD  . doxercalciferol  2 mcg Intravenous Q M,W,F-HD  . ertapenem  500 mg Intravenous Q24H  . [START ON 10/07/2013] ferric gluconate (FERRLECIT/NULECIT) IV  62.5 mg Intravenous Q Fri-HD  . lidocaine  1 patch Transdermal Q24H  . midodrine  10 mg Oral BID WC  . pantoprazole  40 mg Oral Q1200

## 2013-10-04 NOTE — Progress Notes (Signed)
Physical Therapy Treatment Patient Details Name: Nicholas [name] MRN: [id number] DOB: 1940/12/22 Today's Date: 10/04/2013 Time: 3149-7026 PT Time Calculation (min): 33 min  PT Assessment / Plan / Recommendation  History of Present Illness 73 year old male with history of end-stage renal disease on dialysis (M., W., F.) ,CAD with ischemic cardiomyopathy and CABG in 6/14, EF of 50% as per last echo, diabetes mellitus, hypertension, A. fib, basilar neuropathy, depression, osteoporosis, COPD, who was admittedwith abdominal pain, N & V.  Patient here recently  for L1 compression fracture. He was managed nonsurgically  by neurosurgery while in the hospital and was discharged to a skilled nursing facility. He was also diagnosed to have C. difficile and was started on oral vancomycin. Patient now s/p right colectomy and repair of descending and sigmoid serosal tears and ileostomy placement.    PT Comments   Patient in chair with nursing assist and techs unable to assist to bed.  Assisted to try to stand, but pt not accepting weight through legs.  Stood with assist of Clarise Cruz plus lift briefly still with LE's buckling even in time it takes to pivot machine and allow pt to sit on bed.  Feel significant weakness, loss of postural control and fear play role in keeping pt from participating more.  Educated staff to use sky lift in the room for future transfers with pt to chair.  Follow Up Recommendations  SNF     Does the patient have the potential to tolerate intense rehabilitation   N/A  Barriers to Discharge  None      Equipment Recommendations  None recommended by PT    Recommendations for Other Services  None  Frequency Min 3X/week   Progress towards PT Goals Progress towards PT goals: Progressing toward goals  Plan Current plan remains appropriate    Precautions / Restrictions Precautions Precautions: Back;Fall   Pertinent Vitals/Pain C/o pain with mobility, not specified.    Mobility  Bed  Mobility Overal bed mobility: Needs Assistance Bed Mobility: Sit to Supine Sit to sidelying: +2 for physical assistance;Max assist General bed mobility comments: assist for lowering trunk and lifting legs Transfers Overall transfer level: Needs assistance (SARA plus) Equipment used: Rolling walker (2 wheeled) Transfers: Stand Pivot Transfers Sit to Stand: Total assist;+2 physical assistance Squat pivot transfers: Total assist;+2 physical assistance General transfer comment: unable to stand from chair with +2-3 person, so used sara plus to lift pt to standing and turn to sit on bed, still sinking in lift and needing directional cues to stay standing      PT Goals (current goals can now be found in the care plan section)    Visit Information  Last PT Received On: 10/04/13 Assistance Needed: +2 History of Present Illness: 73 year old male with history of end-stage renal disease on dialysis (M., W., F.) ,CAD with ischemic cardiomyopathy and CABG in 6/14, EF of 50% as per last echo, diabetes mellitus, hypertension, A. fib, basilar neuropathy, depression, osteoporosis, COPD, who was admittedwith abdominal pain, N & V.  Patient here recently  for L1 compression fracture. He was managed nonsurgically  by neurosurgery while in the hospital and was discharged to a skilled nursing facility. He was also diagnosed to have C. difficile and was started on oral vancomycin. Patient now s/p right colectomy and repair of descending and sigmoid serosal tears and ileostomy placement.     Subjective Data   I don't want to end up on the floor.   Cognition  Cognition Arousal/Alertness: Awake/alert Behavior During  Therapy: Restless Overall Cognitive Status: Impaired/Different from baseline Memory: Decreased recall of precautions Safety/Judgement: Decreased awareness of safety Problem Solving: Slow processing    Balance  Balance Overall balance assessment: Needs assistance Sitting-balance support: Feet  supported Sitting balance-Leahy Scale: Poor Sitting balance - Comments: posterior in sitting, needs mod assist for balance Standing balance-Leahy Scale: Zero Standing balance comment: unable to stand without significant assistance  End of Session PT - End of Session Equipment Utilized During Treatment: Other (comment) (sara plus) Activity Tolerance: Patient limited by fatigue Patient left: in bed;with call bell/phone within reach Nurse Communication: Need for lift equipment   GP     [facility] 10/04/2013, 4:48 PM Magda Kiel, [place] 10/04/2013

## 2013-10-04 NOTE — Progress Notes (Signed)
10/04/2013 3:42 PM  Pt got OOB to chair around 1450 this afternoon.  Per primary RN, Rodena Piety, surgeon informed her that he would like to see the patient up out of bed for 6-8 hours per day.  When I relayed this information to the patient, he became very tearful, stated that he "couldn't sit up anymore" and he "had to get back to bed, he just couldn't do it".  I did explain the importance of trying to stay up OOB for as long as possible, to which he verbalized understanding, however, just felt like he could not physically do it at the present time.  Patient assisted back to bed.  Will continue to monitor patient. Princella Pellegrini

## 2013-10-04 NOTE — Progress Notes (Signed)
[facility] PROGRESS NOTE  Nicholas [name] CBJ:[id number] DOB: 11-28-1940 DOA: 09/25/2013 PCP: Ramonita Lab  73 yo white male with end-stage renal disease status post exploratory laparotomy for small bowel obstruction. 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 The patient with right colectomy and repair of descending and sigmoid serosal tears and ileostomy placement. The patient is on vasopressor support postop mechanical ventilation and critical care was consult   Assessment/Plan: Bowel obstruction:  Due to colon mass at ileocecal valve with perforation s/p colectomy invasive adenocarcinoma mod diff- on colonoscopy report -surgical path not back yet -defer management to surgery consult -ertapenem  Acute resp failure, resolved   Cont supplemental oxygen PRN   recent C diff, resolved  -recheck  Hx of CAD, A fib.  Chronic hypotension   Cont midodrine  -holding amiodarone, ASA    End-stage renal disease   HD per renal service   Acute blood loss anemia after surgery.  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  -f/u CBC  -transfuse for Hb < 7  -SCD for DVT prevention    DM 2, controlled - not requiring coverage   Change CBGs to q 8 hrs  DC SSI - resume for glu > 180   Postoperative pain.  Hx of Anxiety/depression.   -prn fentanyl  -restart lidoderm patch  -hold neurotin   Code Status: full Family Communication: patient Disposition Plan: SNF   Consultants:  PCCM  renal  LINES / TUBES:  Rt IJ tunneled HD cath 2/12 >>  ETT 2/12 >> 2/13  Rt radial aline 2/12 >> 2/15  Lt IJ CVL 2/12 >> 2/15   ANTIBIOTICS  invanz 2/12  HPI/Subjective: No SOB, no CP Wanting to go to [facility]  Objective: Filed Vitals:   10/04/13 0556  BP: 109/63  Pulse: 106  Temp: 97.7 F (36.5 C)  Resp: 18    Intake/Output Summary (Last 24 hours) at 10/04/13 0842 Last data filed at 10/04/13 0558  Gross  per 24 hour  Intake    500 ml  Output      0 ml  Net    500 ml   Filed Weights   10/03/13 1346 10/03/13 1756 10/03/13 2036  Weight: 68.1 kg (150 lb 2.1 oz) 68 kg (149 lb 14.6 oz) 68 kg (149 lb 14.6 oz)    Exam:  General: Weak, NAD  Heart: RRR, no murmur or rub  Lungs: CTA bilat, no wheezes or rhonchi  Abdomen: Soft, diffusely tender to light palp, +BS, RLQ ostomy with stool  Extremities: No LE edema     Data Reviewed: Basic Metabolic Panel:  Recent Labs Lab 09/28/13 0738 09/29/13 0508 09/29/13 2045 10/01/13 0400 10/02/13 0357 10/03/13 0400  NA 139 139 134* 135* 140 137  K 4.0 3.2* 3.2* 4.2 3.1* 3.4*  CL 98 97 97 100 102 99  CO2 20 23 18* 20 22 22   GLUCOSE 92 122* 193* 109* 93 94  BUN 44* 21 25* 34* 14 21  CREATININE 5.07* 3.01* 3.27* 3.95* 2.17* 2.92*  CALCIUM 7.3* 7.3* 7.1* 6.9* 7.0* 7.3*  PHOS 4.6  --   --   --   --  4.0   Liver Function Tests:  Recent Labs Lab 09/28/13 0738 10/03/13 0400  ALBUMIN 1.7* 1.5*   No results found for this basename: LIPASE, AMYLASE,  in the last 168 hours No results found for this basename: AMMONIA,  in the last  168 hours CBC:  Recent Labs Lab 09/29/13 0508 09/29/13 2045 10/01/13 0400 10/02/13 0357 10/03/13 0400  WBC 14.9* 31.6* 23.3* 15.4* 15.8*  HGB 9.9* 11.4* 9.1* 8.1* 8.7*  HCT 31.8* 35.7* 28.1* 25.3* 27.5*  MCV 94.1 94.2 94.3 94.4 95.2  PLT 325 359 254 212 265   Cardiac Enzymes: No results found for this basename: CKTOTAL, CKMB, CKMBINDEX, TROPONINI,  in the last 168 hours BNP (last 3 results)  Recent Labs  10/20/12 0320  PROBNP 38564.0*   CBG:  Recent Labs Lab 10/03/13 0743 10/03/13 1142 10/03/13 1828 10/03/13 2039 10/04/13 0820  GLUCAP 98 111* 91 94 120*    Recent Results (from the past 240 hour(s))  MRSA PCR SCREENING     Status: None   Collection Time    09/25/13 11:46 PM      Result Value Ref Range Status   MRSA by PCR NEGATIVE  NEGATIVE Final   Comment:            The GeneXpert MRSA  Assay (FDA     approved for NASAL specimens     only), is one component of a     comprehensive MRSA colonization     surveillance program. It is not     intended to diagnose MRSA     infection nor to guide or     monitor treatment for     MRSA infections.     Studies: No results found.  Scheduled Meds: . antiseptic oral rinse  1 application Mouth Rinse QID  . calcitonin (salmon)  1 spray Alternating Nares Daily  . chlorhexidine  15 mL Mouth/Throat BID  . [START ON 10/10/2013] darbepoetin  150 mcg Intravenous Q Mon-HD  . doxercalciferol  2 mcg Intravenous Q M,W,F-HD  . ertapenem  500 mg Intravenous Q24H  . lidocaine  1 patch Transdermal Q24H  . midodrine  10 mg Oral BID WC  . pantoprazole  40 mg Oral Q1200   Continuous Infusions: . sodium chloride Stopped (10/03/13 1300)    Principal Problem:   Septic shock(785.52) Active Problems:   ESRD (end stage renal disease) on dialysis   DM (diabetes mellitus), type 2 with renal complications   Ischemic cardiomyopathy   CAD (coronary artery disease)   Intractable low back pain   Systolic CHF   L1 vertebral fracture   Protein-calorie malnutrition, severe   Small bowel obstruction   HCAP (healthcare-associated pneumonia)   Hypokalemia   Severe sepsis(995.92)   Peritonitis    Time spent: 25 min    Nicholas [name]  [facility] Pager [phone]. If 7PM-7AM, please contact night-coverage at [email or web address], password [facility] 10/04/2013, 8:42 AM  LOS: 9 days

## 2013-10-04 NOTE — Clinical Social Work Note (Addendum)
CSW talked with patient today regarding discharge plans as he is concerned about losing his bed at [facility]. CSW contacted admissions director [name] regarding patient and she indicated that he will have a bed once ready for discharge, and in fact will be moved to room 602, which is closer to the nursing station.    Nicholas [name], MSW, LCSW [phone]

## 2013-10-04 NOTE — Consult Note (Signed)
WOC ostomy follow up: Patient is known to our service and was seen yesterday.  No visit today, but will be seen tomorrow.. Thanks, Maudie Flakes, MSN, RN, [place], [place], [place] [phone])

## 2013-10-05 LAB — RENAL FUNCTION PANEL
Albumin: 1.4 g/dL — ABNORMAL LOW (ref 3.5–5.2)
BUN: 16 mg/dL (ref 6–23)
CALCIUM: 7.5 mg/dL — AB (ref 8.4–10.5)
CO2: 26 mEq/L (ref 19–32)
Chloride: 104 mEq/L (ref 96–112)
Creatinine, Ser: 2.57 mg/dL — ABNORMAL HIGH (ref 0.50–1.35)
GFR, EST AFRICAN AMERICAN: 27 mL/min — AB (ref >90)
GFR, EST NON AFRICAN AMERICAN: 24 mL/min — AB (ref >90)
Glucose, Bld: 106 mg/dL — ABNORMAL HIGH (ref 70–99)
PHOSPHORUS: 3.2 mg/dL (ref 2.3–4.6)
Potassium: 3.8 mEq/L (ref 3.7–5.3)
SODIUM: 141 meq/L (ref 137–147)

## 2013-10-05 LAB — CBC
HCT: 28.9 % — ABNORMAL LOW (ref 39.0–52.0)
Hemoglobin: 9.1 g/dL — ABNORMAL LOW (ref 13.0–17.0)
MCH: 30.2 pg (ref 26.0–34.0)
MCHC: 31.5 g/dL (ref 30.0–36.0)
MCV: 96 fL (ref 78.0–100.0)
Platelets: 329 10*3/uL (ref 150–400)
RBC: 3.01 MIL/uL — ABNORMAL LOW (ref 4.22–5.81)
RDW: 22.4 % — ABNORMAL HIGH (ref 11.5–15.5)
WBC: 12.7 10*3/uL — ABNORMAL HIGH (ref 4.0–10.5)

## 2013-10-05 LAB — GLUCOSE, CAPILLARY
GLUCOSE-CAPILLARY: 81 mg/dL (ref 70–99)
GLUCOSE-CAPILLARY: 77 mg/dL (ref 70–99)
GLUCOSE-CAPILLARY: 63 mg/dL — AB (ref 70–99)

## 2013-10-05 MED ORDER — GABAPENTIN 600 MG PO TABS
300.0000 mg | ORAL_TABLET | Freq: Every day | ORAL | Status: DC
  Administered 2013-10-06: 300 mg via ORAL
  Filled 2013-10-05 (×3): qty 0.5

## 2013-10-05 MED ORDER — DOXERCALCIFEROL 4 MCG/2ML IV SOLN
INTRAVENOUS | Status: AC
Start: 2013-10-05 — End: 2013-10-05
  Administered 2013-10-05: 2 ug via INTRAVENOUS
  Filled 2013-10-05: qty 2

## 2013-10-05 MED ORDER — BOOST / RESOURCE BREEZE PO LIQD
1.0000 | Freq: Two times a day (BID) | ORAL | Status: DC
  Administered 2013-10-05 – 2013-10-11 (×9): 1 via ORAL

## 2013-10-05 MED ORDER — HYDROMORPHONE HCL PF 1 MG/ML IJ SOLN
INTRAMUSCULAR | Status: AC
  Filled 2013-10-05: qty 2

## 2013-10-05 MED ORDER — HYDROMORPHONE HCL PF 1 MG/ML IJ SOLN
INTRAMUSCULAR | Status: AC
Start: 2013-10-05 — End: 2013-10-05
  Filled 2013-10-05: qty 2

## 2013-10-05 MED ORDER — HYDROMORPHONE HCL PF 1 MG/ML IJ SOLN
INTRAMUSCULAR | Status: AC
  Administered 2013-10-05: 2 mg via INTRAVENOUS
  Filled 2013-10-05: qty 2

## 2013-10-05 MED ORDER — TRAMADOL HCL 50 MG PO TABS
50.0000 mg | ORAL_TABLET | Freq: Two times a day (BID) | ORAL | Status: DC
Start: 2013-10-05 — End: 2013-10-12
  Administered 2013-10-05 – 2013-10-12 (×13): 50 mg via ORAL
  Filled 2013-10-05 (×15): qty 1

## 2013-10-05 MED ORDER — MIDODRINE HCL 5 MG PO TABS
ORAL_TABLET | ORAL | Status: AC
  Filled 2013-10-05: qty 2

## 2013-10-05 NOTE — Procedures (Signed)
Tolerating hemodialysis without hemodynamic instability.  No much fluid to remove due to limited PO intake post op. Jazmen Lindenbaum C

## 2013-10-05 NOTE — Progress Notes (Signed)
Pt seen and examined in HD  No c/o.  Soft, nd, nt. Ostomy functioning.   Nursing staff needs to record ostomy output Consider nutritional shakes  Leighton Ruff. Redmond Pulling, MD, FACS General, Bariatric, & Minimally Invasive Surgery [facility], [place]

## 2013-10-05 NOTE — Progress Notes (Signed)
6 Days Post-Op  Subjective: Dressing not changed since I replaced it yesterday morning.  Pt does not have much of an appetite. Pain not well controlled.    Objective: Vital signs in last 24 hours: Temp:  [97.4 F (36.3 C)-98.8 F (37.1 C)] 97.7 F (36.5 C) (02/18 0701) Pulse Rate:  [78-92] 90 (02/18 0930) Resp:  [18-20] 18 (02/18 0930) BP: (85-146)/(56-87) 112/72 mmHg (02/18 0930) SpO2:  [97 %-98 %] 98 % (02/18 0701) Weight:  [149 lb 14.6 oz (68 kg)-151 lb 14.4 oz (68.9 kg)] 151 lb 14.4 oz (68.9 kg) (02/18 0715) Last BM Date: 10/04/13  Intake/Output from previous day: 02/17 0701 - 02/18 0700 In: 480 [P.O.:480] Out: -  Intake/Output this shift:   Physical Exam:  General: Pt awake/alert/ on vent. NAD  Abdomen: Soft. Nondistended. Appropriately tender. Midline wound is c/d/i.  Stoma does not appear necrosed, stool in ostomy.   Lab Results:   Recent Labs  10/03/13 0400 10/05/13 0540  WBC 15.8* 12.7*  HGB 8.7* 9.1*  HCT 27.5* 28.9*  PLT 265 329   BMET  Recent Labs  10/03/13 0400 10/04/13 1030 10/05/13 0540  NA 137  --  141  K 3.4* 3.9 3.8  CL 99  --  104  CO2 22  --  26  GLUCOSE 94  --  106*  BUN 21  --  16  CREATININE 2.92*  --  2.57*  CALCIUM 7.3*  --  7.5*   PT/INR No results found for this basename: LABPROT, INR,  in the last 72 hours ABG No results found for this basename: PHART, PCO2, PO2, HCO3,  in the last 72 hours  Studies/Results: No results found.  Anti-infectives: Anti-infectives   Start     Dose/Rate Route Frequency Ordered Stop   09/30/13 1400  ertapenem (INVANZ) 0.5 g in sodium chloride 0.9 % 50 mL IVPB     500 mg 100 mL/hr over 30 Minutes Intravenous Every 24 hours 09/29/13 2202     09/29/13 1415  ertapenem (INVANZ) 1 g in sodium chloride 0.9 % 50 mL IVPB     1 g 100 mL/hr over 30 Minutes Intravenous  Once 09/29/13 1159 09/29/13 1412   09/29/13 0600  cefUROXime (ZINACEF) 1.5 g in dextrose 5 % 50 mL IVPB  Status:  Discontinued     1.5  g 100 mL/hr over 30 Minutes Intravenous On call to O.R. 09/28/13 1711 09/29/13 2203   09/28/13 1200  ceFAZolin (ANCEF) IVPB 2 g/50 mL premix    Comments:  Tube to 17   2 g 100 mL/hr over 30 Minutes Intravenous  Once 09/28/13 1155 09/28/13 1246   09/27/13 1200  vancomycin (VANCOCIN) IVPB 1000 mg/200 mL premix  Status:  Discontinued     1,000 mg 200 mL/hr over 60 Minutes Intravenous Every M-W-F (Hemodialysis) 09/26/13 1638 09/26/13 1721   09/26/13 1745  metroNIDAZOLE (FLAGYL) IVPB 500 mg  Status:  Discontinued     500 mg 100 mL/hr over 60 Minutes Intravenous Every 8 hours 09/26/13 1722 10/03/13 1112   09/26/13 1200  vancomycin (VANCOCIN) IVPB 750 mg/150 ml premix  Status:  Discontinued     750 mg 150 mL/hr over 60 Minutes Intravenous Every M-W-F (Hemodialysis) 09/25/13 2344 09/26/13 1051   09/26/13 1200  vancomycin (VANCOCIN) IVPB 1000 mg/200 mL premix  Status:  Discontinued     1,000 mg 200 mL/hr over 60 Minutes Intravenous Every M-W-F (Hemodialysis) 09/26/13 1052 09/26/13 1638   09/26/13 0600  piperacillin-tazobactam (ZOSYN) IVPB 2.25 g  Status:  Discontinued     2.25 g 100 mL/hr over 30 Minutes Intravenous 3 times per day 09/25/13 2126 09/26/13 1721   09/26/13 0000  vancomycin (VANCOCIN) 500 mg in sodium chloride irrigation 0.9 % 100 mL ENEMA  Status:  Discontinued     500 mg Rectal 4 times per day 09/25/13 2337 09/26/13 1722   09/25/13 2130  vancomycin (VANCOCIN) IVPB 750 mg/150 ml premix     750 mg 150 mL/hr over 60 Minutes Intravenous  Once 09/25/13 2124 09/26/13 0145   09/25/13 2000  piperacillin-tazobactam (ZOSYN) IVPB 3.375 g     3.375 g 100 mL/hr over 30 Minutes Intravenous  Once 09/25/13 1853 09/25/13 2002   09/25/13 2000  vancomycin (VANCOCIN) IVPB 1000 mg/200 mL premix     1,000 mg 200 mL/hr over 60 Minutes Intravenous  Once 09/25/13 1859 09/25/13 2113      Assessment/Plan: Recent back fx  ESRD on HD  CAD  DM  SBO secondary mass at ileocecal valve with perforation  of the right colon  Severe colitis  Mucosal hemorrhage/ulceration  S/p exploratory laparotomy, right colectomy, repair of descending and sigmoid serosal tears, ileostomy Dr. Grandville Silos 09/29/13  POD#6  -colonoscopy biopsy report shows adenocarcinoma. Surgical pathology pending  -tolerating diet, but poor oral intake.  Consult to dietician -continue antibiotics. Indication: perforated bowel, c diff. invanz D#6/7 -monitor stoma  -IS  -Mobilize  -BID wet to dry dressing changes -add tramadol BID to scheduled tylenol for better pain control   LOS: 10 days    Nicholas [name] ANP-BC 10/05/2013 9:43 AM

## 2013-10-05 NOTE — Progress Notes (Signed)
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 Alen Blew who will set pt up with outpt followup Acute Respiratory Failure -extubated 2/13 -stable on RA Sepsis -resolved -due to peritonitis -continue ertapenem -remains hemodynamically stable -WBC improving CAD/Afib -no anticoagulation due to Hx GIB ESRD -per nephrology -HD on MWF Chronic hypotension -holding amiodarone DM2 -Hemoglobin A1c 5.5 on 08/07/2013 -Discontinue CBG checks Postop pain -educated pt to try to [place] oxy IR first -Dilauid for uncontrolled pain Acute blood loss anemia after surgery.  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  -f/u CBC  -transfuse for Hb < 7  -SCD for DVT prevention     Family Communication:   Pt at beside Disposition Plan:   Home when medically stable       Procedures/Studies: Ct Abdomen Pelvis Wo Contrast  09/25/2013   CLINICAL DATA:  Vomiting, abdominal distention, constipation  EXAM: CT ABDOMEN AND PELVIS WITHOUT CONTRAST  TECHNIQUE: Multidetector CT imaging of the abdomen and pelvis was performed following the standard protocol without intravenous contrast.  COMPARISON:  None.  FINDINGS: There are multiple dilated small bowel loops throughout the abdomen and pelvis. In the right mid abdomen, there is a small bowel loop with the extensive bowel content and crecent air anteriorly; this is more  likely due to air surrounding stool and less likely due to pneumatosis. There is bowel wall thickening in decompressed ascending, transverse and descending colon.  The liver, spleen, pancreas are normal. The gallbladder is distended. The adrenal glands are normal. The kidneys are atrophic. There are nonobstructing left kidney stones. There are small high and low density lesions in both kidneys, evaluation is limited without contrast. There is atherosclerosis of the abdominal aorta without aneurysmal dilatation. There is no abdominal lymphadenopathy.  Evaluation is decompressed bladder is limited by metallic artifact from the bilateral hip replacements. There is a small left pleural effusion. There is compression atelectasis of the bilateral posterior lung bases. There are patchy airspace consolidation the of the posterior right upper lobe suspicious for pneumonia.  IMPRESSION: Findings consistent with small bowel obstruction. In the right mid abdomen, there is a small bowel loop with the extensive bowel content and crecent air anteriorly; this is more likely due to air surrounding stool and less likely due to pneumatosis. Clinical correlation with lactose level is recommended.  Colonic bowel wall thickening of ascending, transverse, and descending colon. This is in part due to decompressed colon. However, colitis or ischemic colon are not excluded on this noncontrast exam.  Right upper lobe pneumonia.   Electronically Signed   By: Abelardo Diesel M.D.   On: 09/25/2013 17:56   Dg Chest 2 View  09/09/2013   CLINICAL DATA:  Leukocytosis.  Preop back surgery.  EXAM: CHEST  2 VIEW  COMPARISON:  07/21/2013  FINDINGS: The patient is status post median sternotomy and CABG procedure. There is mild to moderate cardiac enlargement. Chronic asymmetric elevation of the left hemidiaphragm is noted. There is a new left pleural effusion identified. Atelectasis is present in both lung bases. 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  IMPRESSION: 1. New left pleural effusion.   Electronically Signed   By: Kerby Moors M.D.   On: 09/09/2013 18:54   Ct Lumbar Spine Wo Contrast  09/09/2013   CLINICAL DATA:  Back pain.  Fall 6 weeks ago.  L1 fracture.  EXAM: CT LUMBAR SPINE WITHOUT CONTRAST  TECHNIQUE: Multidetector CT imaging of the lumbar spine was performed without intravenous contrast administration. Multiplanar CT image reconstructions were also generated.  COMPARISON:  09/08/2013  FINDINGS: The spinous processes, facets, an vertebral bodies are all fused in the lumbar spine suggesting severe and advanced ankylosing spondylitis.  There is a transverse cleft in the L1 vertebral body extending longitudinally into the pedicles. This cleft has fluid signal and tiny locules of gas signal, and the fracture appears to continue through the posterior elements in a nondisplaced manner. No subluxation. Irregular margins of the fracture somewhat intact interdigitate.  The T11-12 level does not appear completely fused although there is fusion at T11.  Severe bony demineralization.  The sacroiliac joints are completely fused.  Scattered exophytic lesions of both kidneys have varying complexity.  There is paraspinal hematoma in the vicinity of the fracture. I do not demonstrate significant bony retropulsion.  Aortoiliac atherosclerotic vascular disease is present.  There is considerable abnormal wall thickening and 3 long segment of the sigmoid colon, potentially from prominent inflammation or less likely tumor. Adjacent mesenteric edema noted.  Mild levoconvex lumbar scoliosis. Scattered small retroperitoneal lymph nodes are present.  IMPRESSION: 1. Acute nondisplaced transverse fracture through the vertebral body and posterior elements of L1 (Chance fracture), with associated paraspinal edema. 2. Markedly severe manifestations of ankylosing spondylitis with complete anterior and posterior fusion in the lumbar spine  and complete fusion of the sacroiliac joints. 3. Severe wall thickening in the sigmoid colon with surrounding stranding, potentially from severe colitis or tumor. 4. Bilateral complex renal exophytic lesions, probably complex cysts although these are not characterized on today's exam with regard to malignant potential.   Electronically Signed   By: Sherryl Barters M.D.   On: 09/09/2013 17:44   Ir Fluoro Guide Cv Line Right  09/28/2013   CLINICAL DATA:  Non functional left arm AV dialysis fistula, not amenable to percutaneous intervention. Dialysis access needed.  EXAM: TUNNELED HEMODIALYSIS CATHETER PLACEMENT WITH ULTRASOUND AND FLUOROSCOPIC GUIDANCE  TECHNIQUE: The procedure, risks, benefits, and alternatives were explained to the patient. Questions regarding the procedure were encouraged and answered. The patient understands and consents to the procedure. As antibiotic prophylaxis, cefazolin 1 g was ordered pre-procedure and administered intravenously within one hour of incision.Patency of the right IJ vein was confirmed with ultrasound with image documentation. An appropriate skin site was determined. Region was prepped using maximum barrier technique including cap and mask, sterile gown, sterile gloves, large sterile sheet, and Chlorhexidine as cutaneous antisepsis. The region was infiltrated locally with 1% lidocaine.  Intravenous Fentanyl and Versed were administered as conscious sedation during continuous cardiorespiratory monitoring by the radiology RN, with a total moderate sedation time of 80 minutes.  Under real-time ultrasound guidance, the right IJ vein was accessed with a 21 gauge micropuncture needle; the needle tip within the vein was confirmed with ultrasound image documentation. Needle exchanged over the 018 guidewire for transitional dilator, which allowed advancement of a Benson wire into the IVC. Over this, an MPA catheter was advanced. A Hemosplit 19 hemodialysis catheter was tunneled from  the right anterior chest wall approach to the right IJ dermatotomy site. The MPA  catheter was exchanged over an Amplatz wire for serial vascular dilators which allow placement of a peel-away sheath, through which the catheter was advanced under intermittent fluoroscopy, positioned with its tips in the proximal and midright atrium. Spot chest radiograph confirms good catheter position. No pneumothorax. Catheter was flushed and primed per protocol. Catheter secured externally with O Prolene sutures. The right IJ dermatotomy site was closed with 3-0 Monocryl subcutaneous suture and covered with Dermabond. No immediate complication.  COMPARISON:  None  FLUOROSCOPY TIME:  10 min 12 seconds  ACCESS: Remains approachable for percutaneous intervention as needed.  IMPRESSION: 1. Technically successful placement of tunneled right IJ hemodialysis catheter with ultrasound and fluoroscopic guidance. Ready for routine use.   Electronically Signed   By: Arne Cleveland M.D.   On: 09/28/2013 14:10   Ir Angio Av Shunt Addl Access  09/28/2013   CLINICAL DATA:  End stage renal disease, poor function of left arm dialysis fistula.  TECHNIQUE: DIALYSIS SHUNTOGRAM  The procedure, risks (including but not limited to bleeding, infection, organ damage ), benefits, and alternatives were explained to the patient. Questions regarding the procedure were encouraged and answered. The patient understands and consents to the procedure.  In preparation for possible declot procedure, the skin overlying the left forearm fistula was prepped with Betadine, draped in usual sterile fashion, infiltrated locally with 1% lidocaine.  Intravenous Fentanyl and Versed were administered as conscious sedation during continuous cardiorespiratory monitoring by the radiology RN, with a total moderate sedation time of eighty minutes.  The outflow vein from the fistula was accessed just central to the fistula with a 21 gauge micropuncture needle under real-time  ultrasound guidance. Needle was exchanged over a 018 guide wire for a 4 French transitional dilator. This was used for outflow venography.  The outflow vein was then accessed more centrally retrograde with a 21 gauge micropuncture needle under real-time ultrasound guidance. The needle was exchanged over the 018 wire for the transitional dilator, which allow placement of a Bentson wire. Over this, a 5 [place] Kumpe catheter was advanced and used to negotiate the arterial anastomosis for additional fistulography. Attempts were made to catheterize the proximal radial artery using a variety of catheter & guidewire combinations but these were ultimately unsuccessful. Assessment of the arterial anatomy of the forearm was performed with a blood pressure cuff at the upper arm level.  Ultimately, No declot procedure was needed. The access sites were removed and hemostasis achieved with manual compression.  No immediate complication.  COMPARISON:  None  FLUOROSCOPY TIME:  10 min 12 seconds  ACCESS: Proximal left radial artery occlusion. Not amenable to percutaneous intervention. Consider surgical consultation.  FINDINGS: The outflow cephalic vein from the fistula is widely patent. Central venous system through the SVC is patent. There is a competing outflow vein at the level of the midforearm. There is stenosis at the arterial anastomosis. The proximal radial artery is occluded, not opacified central to the level of the fistula. There is retrograde flow in the distal radial artery supplied by palmar arches, supplying low-grade flow across the fistula.  IMPRESSION: 1. Proximal left radial artery occlusion. Collateral flow via palmar arches reconstitutes the distal radial artery, with retrograde flow into the fistula, at an inadequate rate. This is not amenable to percutaneous intervention. After telephone consultation with Dr. Jonnie Finner, plan made to proceed with hemodialysis catheter placement.   Electronically Signed   By:  Arne Cleveland M.D.   On: 09/28/2013 14:17   Ir US Guide Vasc Access  Left  09/28/2013   CLINICAL DATA:  End stage renal disease, poor function of left arm dialysis fistula.  TECHNIQUE: DIALYSIS SHUNTOGRAM  The procedure, risks (including but not limited to bleeding, infection, organ damage ), benefits, and alternatives were explained to the patient. Questions regarding the procedure were encouraged and answered. The patient understands and consents to the procedure.  In preparation for possible declot procedure, the skin overlying the left forearm fistula was prepped with Betadine, draped in usual sterile fashion, infiltrated locally with 1% lidocaine.  Intravenous Fentanyl and Versed were administered as conscious sedation during continuous cardiorespiratory monitoring by the radiology RN, with a total moderate sedation time of eighty minutes.  The outflow vein from the fistula was accessed just central to the fistula with a 21 gauge micropuncture needle under real-time ultrasound guidance. Needle was exchanged over a 018 guide wire for a 4 French transitional dilator. This was used for outflow venography.  The outflow vein was then accessed more centrally retrograde with a 21 gauge micropuncture needle under real-time ultrasound guidance. The needle was exchanged over the 018 wire for the transitional dilator, which allow placement of a Bentson wire. Over this, a 5 [place] Kumpe catheter was advanced and used to negotiate the arterial anastomosis for additional fistulography. Attempts were made to catheterize the proximal radial artery using a variety of catheter & guidewire combinations but these were ultimately unsuccessful. Assessment of the arterial anatomy of the forearm was performed with a blood pressure cuff at the upper arm level.  Ultimately, No declot procedure was needed. The access sites were removed and hemostasis achieved with manual compression.  No immediate complication.  COMPARISON:  None   FLUOROSCOPY TIME:  10 min 12 seconds  ACCESS: Proximal left radial artery occlusion. Not amenable to percutaneous intervention. Consider surgical consultation.  FINDINGS: The outflow cephalic vein from the fistula is widely patent. Central venous system through the SVC is patent. There is a competing outflow vein at the level of the midforearm. There is stenosis at the arterial anastomosis. The proximal radial artery is occluded, not opacified central to the level of the fistula. There is retrograde flow in the distal radial artery supplied by palmar arches, supplying low-grade flow across the fistula.  IMPRESSION: 1. Proximal left radial artery occlusion. Collateral flow via palmar arches reconstitutes the distal radial artery, with retrograde flow into the fistula, at an inadequate rate. This is not amenable to percutaneous intervention. After telephone consultation with Dr. Jonnie Finner, plan made to proceed with hemodialysis catheter placement.   Electronically Signed   By: Arne Cleveland M.D.   On: 09/28/2013 14:17   Ir US Guide Vasc Access Right  09/28/2013   CLINICAL DATA:  Non functional left arm AV dialysis fistula, not amenable to percutaneous intervention. Dialysis access needed.  EXAM: TUNNELED HEMODIALYSIS CATHETER PLACEMENT WITH ULTRASOUND AND FLUOROSCOPIC GUIDANCE  TECHNIQUE: The procedure, risks, benefits, and alternatives were explained to the patient. Questions regarding the procedure were encouraged and answered. The patient understands and consents to the procedure. As antibiotic prophylaxis, cefazolin 1 g was ordered pre-procedure and administered intravenously within one hour of incision.Patency of the right IJ vein was confirmed with ultrasound with image documentation. An appropriate skin site was determined. Region was prepped using maximum barrier technique including cap and mask, sterile gown, sterile gloves, large sterile sheet, and Chlorhexidine as cutaneous antisepsis. The region was  infiltrated locally with 1% lidocaine.  Intravenous Fentanyl and Versed were administered as conscious sedation during continuous cardiorespiratory monitoring by  the radiology RN, with a total moderate sedation time of 80 minutes.  Under real-time ultrasound guidance, the right IJ vein was accessed with a 21 gauge micropuncture needle; the needle tip within the vein was confirmed with ultrasound image documentation. Needle exchanged over the 018 guidewire for transitional dilator, which allowed advancement of a Benson wire into the IVC. Over this, an MPA catheter was advanced. A Hemosplit 19 hemodialysis catheter was tunneled from the right anterior chest wall approach to the right IJ dermatotomy site. The MPA catheter was exchanged over an Amplatz wire for serial vascular dilators which allow placement of a peel-away sheath, through which the catheter was advanced under intermittent fluoroscopy, positioned with its tips in the proximal and midright atrium. Spot chest radiograph confirms good catheter position. No pneumothorax. Catheter was flushed and primed per protocol. Catheter secured externally with O Prolene sutures. The right IJ dermatotomy site was closed with 3-0 Monocryl subcutaneous suture and covered with Dermabond. No immediate complication.  COMPARISON:  None  FLUOROSCOPY TIME:  10 min 12 seconds  ACCESS: Remains approachable for percutaneous intervention as needed.  IMPRESSION: 1. Technically successful placement of tunneled right IJ hemodialysis catheter with ultrasound and fluoroscopic guidance. Ready for routine use.   Electronically Signed   By: Arne Cleveland M.D.   On: 09/28/2013 14:10   Dg Chest Port 1 View  10/01/2013   CLINICAL DATA:  Atelectasis.  EXAM: PORTABLE CHEST - 1 VIEW  COMPARISON:  Chest radiograph 09/30/2013.  FINDINGS: Right-sided hemodialysis catheter unchanged. Left-sided central venous catheter tip projects over the superior cavoatrial junction. Interval extubation. NG  tube tip and side-port project over the left upper quadrant. Stable cardiac and mediastinal contours. Small bilateral pleural effusions with underlying opacities likely representing atelectasis.  IMPRESSION: Marked gaseous distention of the visualized upper abdominal bowel loops, recommend correlation with dedicated abdominal radiography.  Interval extubation.  Otherwise stable support apparatus.  Small bilateral pleural effusions.  Underlying atelectasis.   Electronically Signed   By: Lovey Newcomer M.D.   On: 10/01/2013 08:08   Dg Chest Port 1 View  09/30/2013   CLINICAL DATA:  Central line placement  EXAM: PORTABLE CHEST - 1 VIEW  COMPARISON:  Air radiograph 09/29/2013  FINDINGS: There has been interval placement of a left IJ central venous catheter with tip overlying the cavoatrial junction. Tip of the endotracheal tube is positioned 3.6 cm above the carina. Right-sided hemodialysis catheter is unchanged.  Sequelae of prior CABG again noted.  Lungs are hypoinflated. Bilateral pleural effusions, left greater than right, are slightly improved relative to prior study. Associated bibasilar atelectasis is present. No new focal infiltrate. No overt pulmonary edema. No pneumothorax.  Osseous structures are unchanged.  IMPRESSION: 1. Tip of the left IJ central venous catheter overlying the cavoatrial junction. Remaining support apparatus as above. 2. Slight interval decrease in size of bilateral pleural effusions, left greater than right, with similar bibasilar atelectasis.   Electronically Signed   By: Jeannine Boga M.D.   On: 09/30/2013 03:23   Dg Chest Port 1 View  09/29/2013   CLINICAL DATA:  Status post right partial colectomy  EXAM: PORTABLE CHEST - 1 VIEW  COMPARISON:  02/17/2013  FINDINGS: Cardiac shadow remains mildly enlarged. A dialysis catheter is seen in satisfactory position. An endotracheal tube is now noted 3.8 cm above the carina. A nasogastric catheter extends into the stomach. Bilateral  pleural effusions left greater than right are seen. The left effusion appears chronic in nature when compare with the  previous exam from multiple months previous. No new focal abnormality is seen.  IMPRESSION: Tubes and lines as described.  Pleural effusions left greater than right. The left effusion appears chronic in nature.   Electronically Signed   By: Inez Catalina M.D.   On: 09/29/2013 18:00   Dg Abd 2 Views  09/28/2013   CLINICAL DATA:  Small bowel obstruction. Abdominal distention. NG tube placement.  EXAM: ABDOMEN - 2 VIEW  COMPARISON:  09/27/2013.  FINDINGS: Supine and left lateral decubitus views of the abdomen again demonstrate diffuse dilatation of the small bowel loops in the central abdomen measuring up to 4.3 cm in diameter. Multiple air-fluid levels are noted on the left lateral decubitus view. No frank pneumoperitoneum. Paucity of distal colonic gas, although there is a small amount of rectal gas present. Nasogastric tube appears coiled in the stomach. 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  IMPRESSION: 1. Bowel gas pattern remains compatible with a small bowel obstruction. 2. No pneumoperitoneum at this time. 3. Nasogastric tube remains coiled in the stomach.   Electronically Signed   By: Vinnie Langton M.D.   On: 09/28/2013 10:38   Dg Abd 2 Views  09/27/2013   CLINICAL DATA:  Mid abdominal pain, back pain  EXAM: ABDOMEN - 2 VIEW  COMPARISON:  DG ABD PORTABLE 1V dated 09/26/2013  FINDINGS: Multiple dilated loops small bowel appreciated throughout the abdomen. Air-fluid levels identified. There is no evidence of free air. A moderate amount of stools appreciated region of the ascending colon. There is a paucity of distal bowel gas. The NG tube is seen with tip curled in the region of the stomach. When compared to previous study the dilated loops of small bowel have decreased in size and distribution.  IMPRESSION: Findings  consistent with an improving small bowel obstruction. There is no evidence of free air.   Electronically Signed   By: Margaree Mackintosh M.D.   On: 09/27/2013 13:27   Dg Abd Portable 1v  10/02/2013   CLINICAL DATA:  Evaluate for resolution of ileus  EXAM: PORTABLE ABDOMEN - 1 VIEW  COMPARISON:  DG ABD PORTABLE 1V dated 10/01/2013; DG ABD PORTABLE 2V dated 09/29/2013; CT ABD/PELV WO CM dated 09/25/2013  FINDINGS: There is a paucity of bowel gas. There is persistent moderate gas distention of several mildly dilated loops of rather featureless appearing small bowel within the left mid hemi abdomen though there is decreased gaseous distention of these bowel loops. A minimal amount of air is seen within the ascending and descending colon.  Nondiagnostic evaluation pneumoperitoneum. An enteric tube and side port overlie the expected location of the mid body of the stomach.  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 Post bilateral total hip replacements, incompletely imaged.  IMPRESSION: 1. Findings most suggestive of improving ileus, though note, there is persistent mild gas distention of several featureless loops of small bowel within the mid abdomen. Clinical correlation is advised. 2. Sequela of ankylosing spondylitis   Electronically Signed   By: Sandi Mariscal M.D.   On: 10/02/2013 08:21   Dg Abd Portable 1v  10/01/2013   CLINICAL DATA:  Distension.  Recent hemicolectomy with colostomy.  EXAM: PORTABLE ABDOMEN - 1 VIEW  COMPARISON:  09/29/2013 and 09/28/2013  FINDINGS: Central venous catheter unchanged with tip over the right atrium. Nasogastric tube is present with tip over the midline upper abdomen likely over the distal stomach. There is continued  evidence of multiple air-filled dilated small bowel loops. There is minimal air within the colon. There is continued evidence of an unusual air collection in the right upper quadrant as cannot exclude free peritoneal air as seen  on the prior study. Remainder of the exam is unchanged.  IMPRESSION: Persistent air-filled dilated small bowel loops with paucity of colonic gas. Findings suggesting small bowel obstructive process. Persistent unusual air collection over the right upper quadrant as this may represent free peritoneal air as seen previously likely due to patient's recent hemicolectomy.  Nasogastric tube with tip over the mid abdomen likely in the distal stomach.  Findings discussed with patient's nurse, Morey Hummingbird, at the time of dictation.   Electronically Signed   By: Marin Olp M.D.   On: 10/01/2013 14:56   Dg Abd Portable 1v  09/26/2013   CLINICAL DATA:  Small bowel obstruction, abdominal pain and distention  EXAM: PORTABLE ABDOMEN - 1 VIEW  COMPARISON:  Prior abdominal radiograph 09/25/2013  FINDINGS: Persistent gaseous distension of the stomach and multiple loops of small bowel. The tip of the nasogastric tube projects over the gastric bubble, however the proximal side hole is in the region of the gastroesophageal junction. The degree of small-bowel distention is not significantly changed since the prior study. Maximal small bowel diameter is again 5 cm. No large free air on this single supine radiograph. Surgical changes of prior median sternotomy and bilateral total hip arthroplasties. The bones are osteopenic. Atherosclerotic vascular calcifications noted in the aorta.  IMPRESSION: 1. No significant interval change in the appearance of high-grade small bowel obstruction. Maximal small bowel diameter is again 5 cm. 2. The proximal side port of the nasogastric tube is in the GE junction. Consider advancing 5 cm for more optimal placement.   Electronically Signed   By: Jacqulynn Cadet M.D.   On: 09/26/2013 07:35   Dg Abd Portable 1v  09/25/2013   CLINICAL DATA:  Nasogastric tube placement  EXAM: PORTABLE ABDOMEN - 1 VIEW  COMPARISON:  CT abdomen September 25, 2013  FINDINGS: Nasogastric tube is identified with distal tip in  the mid stomach. The stomach is air-filled and distended. There are multiple dilated small bowel loops.  IMPRESSION: Naso gastric tube distal tip in the mid stomach. Small bowel obstruction.   Electronically Signed   By: Abelardo Diesel M.D.   On: 09/25/2013 22:56   Dg Abd Portable 2v  09/29/2013   CLINICAL DATA:  Status post colonoscopy. Question free intraperitoneal air.  EXAM: PORTABLE ABDOMEN - 2 VIEW  COMPARISON:  CT abdomen and pelvis 09/25/2013.  FINDINGS: There is a large volume of free intraperitoneal air. NG tube is in place with tip in the stomach.  IMPRESSION: Study is positive for free intraperitoneal air.  Critical Value/emergent results were called by telephone at the time of interpretation on 09/29/2013 at 12:27 PM to Dr. Ronald Lobo , who verbally acknowledged these results.   Electronically Signed   By: Inge Rise M.D.   On: 09/29/2013 12:30   Ir Shuntogram/ Fistulagram Left Mod Sed  09/28/2013   CLINICAL DATA:  End stage renal disease, poor function of left arm dialysis fistula.  TECHNIQUE: DIALYSIS SHUNTOGRAM  The procedure, risks (including but not limited to bleeding, infection, organ damage ), benefits, and alternatives were explained to the patient. Questions regarding the procedure were encouraged and answered. The patient understands and consents to the procedure.  In preparation for possible declot procedure, the skin overlying the left forearm fistula was prepped  with Betadine, draped in usual sterile fashion, infiltrated locally with 1% lidocaine.  Intravenous Fentanyl and Versed were administered as conscious sedation during continuous cardiorespiratory monitoring by the radiology RN, with a total moderate sedation time of eighty minutes.  The outflow vein from the fistula was accessed just central to the fistula with a 21 gauge micropuncture needle under real-time ultrasound guidance. Needle was exchanged over a 018 guide wire for a 4 French transitional dilator. This was  used for outflow venography.  The outflow vein was then accessed more centrally retrograde with a 21 gauge micropuncture needle under real-time ultrasound guidance. The needle was exchanged over the 018 wire for the transitional dilator, which allow placement of a Bentson wire. Over this, a 5 [place] Kumpe catheter was advanced and used to negotiate the arterial anastomosis for additional fistulography. Attempts were made to catheterize the proximal radial artery using a variety of catheter & guidewire combinations but these were ultimately unsuccessful. Assessment of the arterial anatomy of the forearm was performed with a blood pressure cuff at the upper arm level.  Ultimately, No declot procedure was needed. The access sites were removed and hemostasis achieved with manual compression.  No immediate complication.  COMPARISON:  None  FLUOROSCOPY TIME:  10 min 12 seconds  ACCESS: Proximal left radial artery occlusion. Not amenable to percutaneous intervention. Consider surgical consultation.  FINDINGS: The outflow cephalic vein from the fistula is widely patent. Central venous system through the SVC is patent. There is a competing outflow vein at the level of the midforearm. There is stenosis at the arterial anastomosis. The proximal radial artery is occluded, not opacified central to the level of the fistula. There is retrograde flow in the distal radial artery supplied by palmar arches, supplying low-grade flow across the fistula.  IMPRESSION: 1. Proximal left radial artery occlusion. Collateral flow via palmar arches reconstitutes the distal radial artery, with retrograde flow into the fistula, at an inadequate rate. This is not amenable to percutaneous intervention. After telephone consultation with Dr. Jonnie Finner, plan made to proceed with hemodialysis catheter placement.   Electronically Signed   By: Arne Cleveland M.D.   On: 09/28/2013 14:17   [facility] [employer] [facility]  09/09/2013   This examination belongs to  an outside facility and is stored  here for comparison purposes only.  Contact the originating outside  institution for any associated report or interpretation.        Subjective: Patient complains of low back pain. Denies fevers, chills, chest discomfort, shortness breath, nausea, vomiting, diarrhea, abdominal pain.  Objective: Filed Vitals:   10/05/13 1000 10/05/13 1030 10/05/13 1100 10/05/13 1122  BP: 107/71 106/76 119/85 119/74  Pulse: 94 97 101 99  Temp:    97.7 F (36.5 C)  TempSrc:    Oral  Resp: 18 18 18 18   Height:      Weight:    68.2 kg (150 lb 5.7 oz)  SpO2:    98%    Intake/Output Summary (Last 24 hours) at 10/05/13 1623 Last data filed at 10/05/13 1122  Gross per 24 hour  Intake    360 ml  Output    601 ml  Net   -241 ml   Weight change: -0.1 kg (-3.5 oz) Exam:   General:  Pt is alert, follows commands appropriately, not in acute distress  HEENT: No icterus, No thrush,  [place]/AT  Cardiovascular: RRR, S1/S2, no rubs, no gallops  Respiratory: CTA bilaterally, no wheezing, no crackles, no rhonchi  Abdomen:  Soft/+BS, non tender, non distended, no guarding  Extremities: trace LE edema, No lymphangitis, No petechiae, No rashes, no synovitis  Data Reviewed: Basic Metabolic Panel:  Recent Labs Lab 09/29/13 2045 10/01/13 0400 10/02/13 0357 10/03/13 0400 10/04/13 1030 10/05/13 0540  NA 134* 135* 140 137  --  141  K 3.2* 4.2 3.1* 3.4* 3.9 3.8  CL 97 100 102 99  --  104  CO2 18* 20 22 22   --  26  GLUCOSE 193* 109* 93 94  --  106*  BUN 25* 34* 14 21  --  16  CREATININE 3.27* 3.95* 2.17* 2.92*  --  2.57*  CALCIUM 7.1* 6.9* 7.0* 7.3*  --  7.5*  PHOS  --   --   --  4.0  --  3.2   Liver Function Tests:  Recent Labs Lab 10/03/13 0400 10/05/13 0540  ALBUMIN 1.5* 1.4*   No results found for this basename: LIPASE, AMYLASE,  in the last 168 hours No results found for this basename: AMMONIA,  in the last 168 hours CBC:  Recent Labs Lab  09/29/13 2045 10/01/13 0400 10/02/13 0357 10/03/13 0400 10/05/13 0540  WBC 31.6* 23.3* 15.4* 15.8* 12.7*  HGB 11.4* 9.1* 8.1* 8.7* 9.1*  HCT 35.7* 28.1* 25.3* 27.5* 28.9*  MCV 94.2 94.3 94.4 95.2 96.0  PLT 359 254 212 265 329   Cardiac Enzymes: No results found for this basename: CKTOTAL, CKMB, CKMBINDEX, TROPONINI,  in the last 168 hours BNP: No components found with this basename: POCBNP,  CBG:  Recent Labs Lab 10/04/13 0820 10/04/13 1159 10/04/13 1715 10/04/13 2030 10/05/13 1220  GLUCAP 120* 100* 146* 116* 81    Recent Results (from the past 240 hour(s))  MRSA PCR SCREENING     Status: None   Collection Time    09/25/13 11:46 PM      Result Value Ref Range Status   MRSA by PCR NEGATIVE  NEGATIVE Final   Comment:            The GeneXpert MRSA Assay (FDA     approved for NASAL specimens     only), is one component of a     comprehensive MRSA colonization     surveillance program. It is not     intended to diagnose MRSA     infection nor to guide or     monitor treatment for     MRSA infections.  CLOSTRIDIUM DIFFICILE BY PCR     Status: None   Collection Time    10/04/13 10:17 AM      Result Value Ref Range Status   C difficile by pcr NEGATIVE  NEGATIVE Final     Scheduled Meds: . acetaminophen  1,000 mg Oral TID  . antiseptic oral rinse  1 application Mouth Rinse QID  . calcitonin (salmon)  1 spray Alternating Nares Daily  . chlorhexidine  15 mL Mouth/Throat BID  . [START ON 10/10/2013] darbepoetin  150 mcg Intravenous Q Mon-HD  . doxercalciferol  2 mcg Intravenous Q M,W,F-HD  . ertapenem  500 mg Intravenous Q24H  . feeding supplement (NEPRO CARB STEADY)  237 mL Oral Q24H  . feeding supplement (RESOURCE BREEZE)  1 Container Oral BID BM  . [START ON 10/07/2013] ferric gluconate (FERRLECIT/NULECIT) IV  62.5 mg Intravenous Q Fri-HD  . lidocaine  1 patch Transdermal Q24H  . midodrine  10 mg Oral BID WC  . pantoprazole  40 mg Oral Q1200  . psyllium  1 packet  Oral  BID  . traMADol  50 mg Oral Q12H   Continuous Infusions: . sodium chloride Stopped (10/03/13 1300)     Zuleica Seith, DO  [facility] Pager [phone]  If 7PM-7AM, please contact night-coverage [email or web address] Password [email or web address] 10/05/2013, 4:23 PM   LOS: 10 days

## 2013-10-05 NOTE — Progress Notes (Addendum)
NUTRITION FOLLOW UP/CONSULT  DOCUMENTATION CODES Per approved criteria  -Severe malnutrition in the context of chronic illness   INTERVENTION: Continue Nepro Shake po daily, each supplement provides 425 kcal and 19 grams protein. Add Resource Breeze po BID, each supplement provides 250 kcal and 9 grams of protein. Monitor magnesium, potassium, and phosphorus daily throughout nutrition advancement, MD to replete as needed, as pt is at risk for refeeding syndrome given prolonged inadequate oral intake. If oral intake does not improve, strongly recommend initiation of nutrition support. If enteral nutrition warranted, please consult RD for recommendations. RD to follow for nutrition care plan.  NUTRITION DIAGNOSIS: Inadequate oral intake now related to altered GI function as evidenced by limited PO intake. Ongoing.  Goal: Pt to meet >/= 90% of their estimated nutrition needs, unmet  Monitor:  PO diet advancement, weight, labs, I/O's, supplement tolerance  ASSESSMENT: PMHx is significant for ESRD (MWF), CAD s/p CABG, DM, HTN, afib, COPD. Recently discharged from [facility] about 2 weeks ago 2/2 L1 compression fx was discharged to SNF. Pt was also dx with C diff during that time. Admitted now with constipation x 3-4 days, also with n/v every time he tries to eat. Work-up reveals ?c diff colitis and HCAP.  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 NGT clamped 2/15. RD saw pt yesterday and initiated oral nutrition supplement of Nepro Shakes. RD consulted today for "poor PO intake" by surgery. Pt reports that he drank 1/2 of a Nepro Shake this morning, noted that it made his stomach hurt. Reports that the biggest barrier to his eating is his pain management. Agreeable to trying [employer].  Potassium and phosphorus now WNL.  Height: Ht Readings from Last 1 Encounters:  09/27/13 5\' 6"  (1.676 m)    Weight: Wt Readings from Last 1 Encounters:  10/05/13 150 lb 5.7 oz (68.2 kg)  Admit wt 163 lb  Estimated Nutritional Needs: Kcal: 2000-2200 Protein: 110-120 gm Fluid: 1.2 liters  Skin:  stage II pressure ulcer to sacrum Abdominal incision (closted) Neck HD catheter site (open) RLQ ileostomy  Diet Order: Criss Rosales diet    Intake/Output Summary (Last 24 hours) at 10/05/13 1202 Last data filed at 10/05/13 1122  Gross per 24 hour  Intake    480 ml  Output    601 ml  Net   -121 ml    Last BM: 2/18 via ostomy - 600 ml so far today  Labs:   Recent Labs Lab 10/02/13 0357 10/03/13 0400 10/04/13 1030 10/05/13 0540  NA 140 137  --  141  K 3.1* 3.4* 3.9 3.8  CL 102 99  --  104  CO2 22 22  --  26  BUN 14 21  --  16  CREATININE 2.17* 2.92*  --  2.57*  CALCIUM 7.0* 7.3*  --  7.5*  PHOS  --  4.0  --  3.2  GLUCOSE 93 94  --  106*    CBG (last 3)   Recent Labs  10/04/13 1159 10/04/13 1715 10/04/13 2030  GLUCAP 100* 146* 116*    Scheduled Meds: . acetaminophen  1,000 mg Oral TID  . antiseptic oral rinse  1 application Mouth Rinse QID  . calcitonin (salmon)  1 spray Alternating Nares Daily  . chlorhexidine  15 mL Mouth/Throat BID  . [START  ON 10/10/2013] darbepoetin  150 mcg Intravenous Q Mon-HD  . doxercalciferol  2 mcg Intravenous Q M,W,F-HD  . ertapenem  500 mg Intravenous Q24H  . feeding supplement (NEPRO CARB STEADY)  237 mL Oral Q24H  . [START ON 10/07/2013] ferric gluconate (FERRLECIT/NULECIT) IV  62.5 mg Intravenous Q Fri-HD  . lidocaine  1 patch Transdermal Q24H  . midodrine  10 mg Oral BID WC  . pantoprazole  40 mg Oral Q1200  . psyllium  1 packet Oral BID  . traMADol  50 mg Oral Q12H    Continuous Infusions: . sodium chloride Stopped (10/03/13 1300)    Inda Coke MS, RD, LDN Inpatient Registered Dietitian Pager:  [phone] After-hours pager: [phone]

## 2013-10-06 ENCOUNTER — Encounter (HOSPITAL_COMMUNITY): Payer: Self-pay | Admitting: Radiology

## 2013-10-06 ENCOUNTER — Inpatient Hospital Stay (HOSPITAL_COMMUNITY): Payer: Medicare Other

## 2013-10-06 ENCOUNTER — Telehealth: Payer: Self-pay | Admitting: Oncology

## 2013-10-06 LAB — CBC
HCT: 29.4 % — ABNORMAL LOW (ref 39.0–52.0)
Hemoglobin: 9.2 g/dL — ABNORMAL LOW (ref 13.0–17.0)
MCH: 30.3 pg (ref 26.0–34.0)
MCHC: 31.3 g/dL (ref 30.0–36.0)
MCV: 96.7 fL (ref 78.0–100.0)
PLATELETS: 363 10*3/uL (ref 150–400)
RBC: 3.04 MIL/uL — AB (ref 4.22–5.81)
RDW: 22.8 % — ABNORMAL HIGH (ref 11.5–15.5)
WBC: 14.3 10*3/uL — ABNORMAL HIGH (ref 4.0–10.5)

## 2013-10-06 LAB — GLUCOSE, CAPILLARY
GLUCOSE-CAPILLARY: 128 mg/dL — AB (ref 70–99)
Glucose-Capillary: 73 mg/dL (ref 70–99)

## 2013-10-06 MED ORDER — HYDRALAZINE HCL 20 MG/ML IJ SOLN: 10.0000 mg | Freq: Once | INTRAMUSCULAR | Status: DC

## 2013-10-06 MED ORDER — IOHEXOL 300 MG/ML  SOLN
25.0000 mL | INTRAMUSCULAR | Status: AC
  Administered 2013-10-06 (×2): 25 mL via ORAL

## 2013-10-06 MED ORDER — METHOCARBAMOL 750 MG PO TABS
750.0000 mg | ORAL_TABLET | Freq: Four times a day (QID) | ORAL | Status: DC
  Administered 2013-10-06 – 2013-10-10 (×13): 750 mg via ORAL
  Filled 2013-10-06 (×19): qty 1

## 2013-10-06 MED ORDER — HYDRALAZINE HCL 10 MG PO TABS
10.0000 mg | ORAL_TABLET | Freq: Once | ORAL | Status: DC
  Filled 2013-10-06: qty 1

## 2013-10-06 MED ORDER — RENA-VITE PO TABS
1.0000 | ORAL_TABLET | Freq: Every day | ORAL | Status: DC
  Administered 2013-10-07 – 2013-10-08 (×2): 1 via ORAL
  Administered 2013-10-09: 22:00:00 via ORAL
  Administered 2013-10-10 – 2013-10-11 (×2): 1 via ORAL
  Filled 2013-10-06 (×7): qty 1

## 2013-10-06 MED ORDER — AMIODARONE HCL 200 MG PO TABS
200.0000 mg | ORAL_TABLET | Freq: Every day | ORAL | Status: DC
  Administered 2013-10-07 – 2013-10-12 (×6): 200 mg via ORAL
  Filled 2013-10-06 (×6): qty 1

## 2013-10-06 NOTE — Consult Note (Signed)
WOC ostomy follow up Stoma type/location: RLQ, end ileostomy Stomal assessment/size: 1 1/4' oval shaped Peristomal assessment: pouch changed by bedside nurse just this am Output: stool in pouch Ostomy pouching: 2pc. In place, cut to fit by bedside nurse Education provided: discussed care of stoma, however patient reports going back to SNF for care. He was complaining of quite a bit of pain at the time of my assessment and was very groggy.  Will follow up next week with patient if still inpatient.   [place] team will follow along with you for ostomy teaching when and if appropriate. Jadzia Ibsen [place] RN,CWOCN  [phone]

## 2013-10-06 NOTE — Progress Notes (Signed)
Chart review complete.  Patient is not eligible for [facility] Care Management services because his/her PCP is not a [facility] primary care provider or is not [facility] affiliated.  For any additional questions or new referrals please contact Tim Henderson BSN RN [facility] Liaison at [phone] °

## 2013-10-06 NOTE — Progress Notes (Signed)
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  -f/u CBC  -transfuse for Hb < 7  -SCD for DVT prevention  Family Communication: Pt at beside  Disposition Plan: Home when medically stable        Procedures/Studies: Ct Abdomen Pelvis Wo Contrast  09/25/2013   CLINICAL DATA:  Vomiting, abdominal distention,  constipation  EXAM: CT ABDOMEN AND PELVIS WITHOUT CONTRAST  TECHNIQUE: Multidetector CT imaging of the abdomen and pelvis was performed following the standard protocol without intravenous contrast.  COMPARISON:  None.  FINDINGS: There are multiple dilated small bowel loops throughout the abdomen and pelvis. In the right mid abdomen, there is a small bowel loop with the extensive bowel content and crecent air anteriorly; this is more likely due to air surrounding stool and less likely due to pneumatosis. There is bowel wall thickening in decompressed ascending, transverse and descending colon.  The liver, spleen, pancreas are normal. The gallbladder is distended. The adrenal glands are normal. The kidneys are atrophic. There are nonobstructing left kidney stones. There are small high and low density lesions in both kidneys, evaluation is limited without contrast. There is atherosclerosis of the abdominal aorta without aneurysmal dilatation. There is no abdominal lymphadenopathy.  Evaluation is decompressed bladder is limited by metallic artifact from the bilateral hip replacements. There is a small left pleural effusion. There is compression atelectasis of the bilateral posterior lung bases. There are patchy airspace consolidation the of the posterior right upper lobe suspicious for pneumonia.  IMPRESSION: Findings consistent with small bowel obstruction. In the right mid abdomen, there is a small bowel loop with the extensive bowel content and crecent air anteriorly; this is more likely due to air surrounding stool and less likely due to pneumatosis. Clinical correlation with lactose level is recommended.  Colonic bowel wall thickening of ascending, transverse, and descending colon. This is in part due to decompressed colon. However, colitis or ischemic colon are not excluded on this noncontrast exam.  Right upper lobe pneumonia.   Electronically  Signed   By: Abelardo Diesel M.D.   On: 09/25/2013 17:56   Dg Chest 2  View  09/09/2013   CLINICAL DATA:  Leukocytosis.  Preop back surgery.  EXAM: CHEST  2 VIEW  COMPARISON:  07/21/2013  FINDINGS: The patient is status post median sternotomy and CABG procedure. There is mild to moderate cardiac enlargement. Chronic asymmetric elevation of the left hemidiaphragm is noted. There is a new left pleural effusion identified. Atelectasis is present in both lung bases. There is advanced osteoarthritis involving both glenohumeral joints.  IMPRESSION: 1. New left pleural effusion.   Electronically Signed   By: Kerby Moors M.D.   On: 09/09/2013 18:54   Ct Lumbar Spine Wo Contrast  09/09/2013   CLINICAL DATA:  Back pain.  Fall 6 weeks ago.  L1 fracture.  EXAM: CT LUMBAR SPINE WITHOUT CONTRAST  TECHNIQUE: Multidetector CT imaging of the lumbar spine was performed without intravenous contrast administration. Multiplanar CT image reconstructions were also generated.  COMPARISON:  09/08/2013  FINDINGS: The spinous processes, facets, an vertebral bodies are all fused in the lumbar spine suggesting severe and advanced ankylosing spondylitis.  There is a transverse cleft in the L1 vertebral body extending longitudinally into the pedicles. This cleft has fluid signal and tiny locules of gas signal, and the fracture appears to continue through the posterior elements in a nondisplaced manner. No subluxation. Irregular margins of the fracture somewhat intact interdigitate.  The T11-12 level does not appear completely fused although there is fusion at T11.  Severe bony demineralization.  The sacroiliac joints are completely fused.  Scattered exophytic lesions of both kidneys have varying complexity.  There is paraspinal hematoma in the vicinity of the fracture. I do not demonstrate significant bony retropulsion.  Aortoiliac atherosclerotic vascular disease is present.  There is considerable abnormal wall thickening and 3 long segment of the sigmoid colon, potentially from prominent inflammation or  less likely tumor. Adjacent mesenteric edema noted.  Mild levoconvex lumbar scoliosis. Scattered small retroperitoneal lymph nodes are present.  IMPRESSION: 1. Acute nondisplaced transverse fracture through the vertebral body and posterior elements of L1 (Chance fracture), with associated paraspinal edema. 2. Markedly severe manifestations of ankylosing spondylitis with complete anterior and posterior fusion in the lumbar spine and complete fusion of the sacroiliac joints. 3. Severe wall thickening in the sigmoid colon with surrounding stranding, potentially from severe colitis or tumor. 4. Bilateral complex renal exophytic lesions, probably complex cysts although these are not characterized on today's exam with regard to malignant potential.   Electronically Signed   By: Sherryl Barters M.D.   On: 09/09/2013 17:44   Ir Fluoro Guide Cv Line Right  09/28/2013   CLINICAL DATA:  Non functional left arm AV dialysis fistula, not amenable to percutaneous intervention. Dialysis access needed.  EXAM: TUNNELED HEMODIALYSIS CATHETER PLACEMENT WITH ULTRASOUND AND FLUOROSCOPIC GUIDANCE  TECHNIQUE: The procedure, risks, benefits, and alternatives were explained to the patient. Questions regarding the procedure were encouraged and answered. The patient understands and consents to the procedure. As antibiotic prophylaxis, cefazolin 1 g was ordered pre-procedure and administered intravenously within one hour of incision.Patency of the right IJ vein was confirmed with ultrasound with image documentation. An appropriate skin site was determined. Region was prepped using maximum barrier technique including cap and mask, sterile gown, sterile gloves, large sterile sheet, and Chlorhexidine as cutaneous antisepsis. The region was infiltrated locally with 1% lidocaine.  Intravenous Fentanyl and Versed were administered as conscious sedation during continuous cardiorespiratory monitoring by the  radiology RN, with a total moderate  sedation time of 80 minutes.  Under real-time ultrasound guidance, the right IJ vein was accessed with a 21 gauge micropuncture needle; the needle tip within the vein was confirmed with ultrasound image documentation. Needle exchanged over the 018 guidewire for transitional dilator, which allowed advancement of a Benson wire into the IVC. Over this, an MPA catheter was advanced. A Hemosplit 19 hemodialysis catheter was tunneled from the right anterior chest wall approach to the right IJ dermatotomy site. The MPA catheter was exchanged over an Amplatz wire for serial vascular dilators which allow placement of a peel-away sheath, through which the catheter was advanced under intermittent fluoroscopy, positioned with its tips in the proximal and midright atrium. Spot chest radiograph confirms good catheter position. No pneumothorax. Catheter was flushed and primed per protocol. Catheter secured externally with O Prolene sutures. The right IJ dermatotomy site was closed with 3-0 Monocryl subcutaneous suture and covered with Dermabond. No immediate complication.  COMPARISON:  None  FLUOROSCOPY TIME:  10 min 12 seconds  ACCESS: Remains approachable for percutaneous intervention as needed.  IMPRESSION: 1. Technically successful placement of tunneled right IJ hemodialysis catheter with ultrasound and fluoroscopic guidance. Ready for routine use.   Electronically Signed   By: Arne Cleveland M.D.   On: 09/28/2013 14:10   Ir Angio Av Shunt Addl Access  09/28/2013   CLINICAL DATA:  End stage renal disease, poor function of left arm dialysis fistula.  TECHNIQUE: DIALYSIS SHUNTOGRAM  The procedure, risks (including but not limited to bleeding, infection, organ damage ), benefits, and alternatives were explained to the patient. Questions regarding the procedure were encouraged and answered. The patient understands and consents to the procedure.  In preparation for possible declot procedure, the skin overlying the left forearm  fistula was prepped with Betadine, draped in usual sterile fashion, infiltrated locally with 1% lidocaine.  Intravenous Fentanyl and Versed were administered as conscious sedation during continuous cardiorespiratory monitoring by the radiology RN, with a total moderate sedation time of eighty minutes.  The outflow vein from the fistula was accessed just central to the fistula with a 21 gauge micropuncture needle under real-time ultrasound guidance. Needle was exchanged over a 018 guide wire for a 4 French transitional dilator. This was used for outflow venography.  The outflow vein was then accessed more centrally retrograde with a 21 gauge micropuncture needle under real-time ultrasound guidance. The needle was exchanged over the 018 wire for the transitional dilator, which allow placement of a Bentson wire. Over this, a 5 [place] Kumpe catheter was advanced and used to negotiate the arterial anastomosis for additional fistulography. Attempts were made to catheterize the proximal radial artery using a variety of catheter & guidewire combinations but these were ultimately unsuccessful. Assessment of the arterial anatomy of the forearm was performed with a blood pressure cuff at the upper arm level.  Ultimately, No declot procedure was needed. The access sites were removed and hemostasis achieved with manual compression.  No immediate complication.  COMPARISON:  None  FLUOROSCOPY TIME:  10 min 12 seconds  ACCESS: Proximal left radial artery occlusion. Not amenable to percutaneous intervention. Consider surgical consultation.  FINDINGS: The outflow cephalic vein from the fistula is widely patent. Central venous system through the SVC is patent. There is a competing outflow vein at the level of the midforearm. There is stenosis at the arterial anastomosis. The proximal radial artery is occluded, not opacified central to the level of the fistula. There is retrograde  flow in the distal radial artery supplied by palmar  arches, supplying low-grade flow across the fistula.  IMPRESSION: 1. Proximal left radial artery occlusion. Collateral flow via palmar arches reconstitutes the distal radial artery, with retrograde flow into the fistula, at an inadequate rate. This is not amenable to percutaneous intervention. After telephone consultation with Dr. Jonnie Finner, plan made to proceed with hemodialysis catheter placement.   Electronically Signed   By: Arne Cleveland M.D.   On: 09/28/2013 14:17   Ir US Guide Vasc Access Left  09/28/2013   CLINICAL DATA:  End stage renal disease, poor function of left arm dialysis fistula.  TECHNIQUE: DIALYSIS SHUNTOGRAM  The procedure, risks (including but not limited to bleeding, infection, organ damage ), benefits, and alternatives were explained to the patient. Questions regarding the procedure were encouraged and answered. The patient understands and consents to the procedure.  In preparation for possible declot procedure, the skin overlying the left forearm fistula was prepped with Betadine, draped in usual sterile fashion, infiltrated locally with 1% lidocaine.  Intravenous Fentanyl and Versed were administered as conscious sedation during continuous cardiorespiratory monitoring by the radiology RN, with a total moderate sedation time of eighty minutes.  The outflow vein from the fistula was accessed just central to the fistula with a 21 gauge micropuncture needle under real-time ultrasound guidance. Needle was exchanged over a 018 guide wire for a 4 French transitional dilator. This was used for outflow venography.  The outflow vein was then accessed more centrally retrograde with a 21 gauge micropuncture needle under real-time ultrasound guidance. The needle was exchanged over the 018 wire for the transitional dilator, which allow placement of a Bentson wire. Over this, a 5 [place] Kumpe catheter was advanced and used to negotiate the arterial anastomosis for additional fistulography. Attempts  were made to catheterize the proximal radial artery using a variety of catheter & guidewire combinations but these were ultimately unsuccessful. Assessment of the arterial anatomy of the forearm was performed with a blood pressure cuff at the upper arm level.  Ultimately, No declot procedure was needed. The access sites were removed and hemostasis achieved with manual compression.  No immediate complication.  COMPARISON:  None  FLUOROSCOPY TIME:  10 min 12 seconds  ACCESS: Proximal left radial artery occlusion. Not amenable to percutaneous intervention. Consider surgical consultation.  FINDINGS: The outflow cephalic vein from the fistula is widely patent. Central venous system through the SVC is patent. There is a competing outflow vein at the level of the midforearm. There is stenosis at the arterial anastomosis. The proximal radial artery is occluded, not opacified central to the level of the fistula. There is retrograde flow in the distal radial artery supplied by palmar arches, supplying low-grade flow across the fistula.  IMPRESSION: 1. Proximal left radial artery occlusion. Collateral flow via palmar arches reconstitutes the distal radial artery, with retrograde flow into the fistula, at an inadequate rate. This is not amenable to percutaneous intervention. After telephone consultation with Dr. Jonnie Finner, plan made to proceed with hemodialysis catheter placement.   Electronically Signed   By: Arne Cleveland M.D.   On: 09/28/2013 14:17   Ir US Guide Vasc Access Right  09/28/2013   CLINICAL DATA:  Non functional left arm AV dialysis fistula, not amenable to percutaneous intervention. Dialysis access needed.  EXAM: TUNNELED HEMODIALYSIS CATHETER PLACEMENT WITH ULTRASOUND AND FLUOROSCOPIC GUIDANCE  TECHNIQUE: The procedure, risks, benefits, and alternatives were explained to the patient. Questions regarding the procedure were encouraged and answered. The  patient understands and consents to the procedure. As  antibiotic prophylaxis, cefazolin 1 g was ordered pre-procedure and administered intravenously within one hour of incision.Patency of the right IJ vein was confirmed with ultrasound with image documentation. An appropriate skin site was determined. Region was prepped using maximum barrier technique including cap and mask, sterile gown, sterile gloves, large sterile sheet, and Chlorhexidine as cutaneous antisepsis. The region was infiltrated locally with 1% lidocaine.  Intravenous Fentanyl and Versed were administered as conscious sedation during continuous cardiorespiratory monitoring by the radiology RN, with a total moderate sedation time of 80 minutes.  Under real-time ultrasound guidance, the right IJ vein was accessed with a 21 gauge micropuncture needle; the needle tip within the vein was confirmed with ultrasound image documentation. Needle exchanged over the 018 guidewire for transitional dilator, which allowed advancement of a Benson wire into the IVC. Over this, an MPA catheter was advanced. A Hemosplit 19 hemodialysis catheter was tunneled from the right anterior chest wall approach to the right IJ dermatotomy site. The MPA catheter was exchanged over an Amplatz wire for serial vascular dilators which allow placement of a peel-away sheath, through which the catheter was advanced under intermittent fluoroscopy, positioned with its tips in the proximal and midright atrium. Spot chest radiograph confirms good catheter position. No pneumothorax. Catheter was flushed and primed per protocol. Catheter secured externally with O Prolene sutures. The right IJ dermatotomy site was closed with 3-0 Monocryl subcutaneous suture and covered with Dermabond. No immediate complication.  COMPARISON:  None  FLUOROSCOPY TIME:  10 min 12 seconds  ACCESS: Remains approachable for percutaneous intervention as needed.  IMPRESSION: 1. Technically successful placement of tunneled right IJ hemodialysis catheter with ultrasound and  fluoroscopic guidance. Ready for routine use.   Electronically Signed   By: Arne Cleveland M.D.   On: 09/28/2013 14:10   Dg Chest Port 1 View  10/01/2013   CLINICAL DATA:  Atelectasis.  EXAM: PORTABLE CHEST - 1 VIEW  COMPARISON:  Chest radiograph 09/30/2013.  FINDINGS: Right-sided hemodialysis catheter unchanged. Left-sided central venous catheter tip projects over the superior cavoatrial junction. Interval extubation. NG tube tip and side-port project over the left upper quadrant. Stable cardiac and mediastinal contours. Small bilateral pleural effusions with underlying opacities likely representing atelectasis.  IMPRESSION: Marked gaseous distention of the visualized upper abdominal bowel loops, recommend correlation with dedicated abdominal radiography.  Interval extubation.  Otherwise stable support apparatus.  Small bilateral pleural effusions.  Underlying atelectasis.   Electronically Signed   By: Lovey Newcomer M.D.   On: 10/01/2013 08:08   Dg Chest Port 1 View  09/30/2013   CLINICAL DATA:  Central line placement  EXAM: PORTABLE CHEST - 1 VIEW  COMPARISON:  Air radiograph 09/29/2013  FINDINGS: There has been interval placement of a left IJ central venous catheter with tip overlying the cavoatrial junction. Tip of the endotracheal tube is positioned 3.6 cm above the carina. Right-sided hemodialysis catheter is unchanged.  Sequelae of prior CABG again noted.  Lungs are hypoinflated. Bilateral pleural effusions, left greater than right, are slightly improved relative to prior study. Associated bibasilar atelectasis is present. No new focal infiltrate. No overt pulmonary edema. No pneumothorax.  Osseous structures are unchanged.  IMPRESSION: 1. Tip of the left IJ central venous catheter overlying the cavoatrial junction. Remaining support apparatus as above. 2. Slight interval decrease in size of bilateral pleural effusions, left greater than right, with similar bibasilar atelectasis.   Electronically Signed    By: Jeannine Boga  M.D.   On: 09/30/2013 03:23   Dg Chest Port 1 View  09/29/2013   CLINICAL DATA:  Status post right partial colectomy  EXAM: PORTABLE CHEST - 1 VIEW  COMPARISON:  02/17/2013  FINDINGS: Cardiac shadow remains mildly enlarged. A dialysis catheter is seen in satisfactory position. An endotracheal tube is now noted 3.8 cm above the carina. A nasogastric catheter extends into the stomach. Bilateral pleural effusions left greater than right are seen. The left effusion appears chronic in nature when compare with the previous exam from multiple months previous. No new focal abnormality is seen.  IMPRESSION: Tubes and lines as described.  Pleural effusions left greater than right. The left effusion appears chronic in nature.   Electronically Signed   By: Inez Catalina M.D.   On: 09/29/2013 18:00   Dg Abd 2 Views  09/28/2013   CLINICAL DATA:  Small bowel obstruction. Abdominal distention. NG tube placement.  EXAM: ABDOMEN - 2 VIEW  COMPARISON:  09/27/2013.  FINDINGS: Supine and left lateral decubitus views of the abdomen again demonstrate diffuse dilatation of the small bowel loops in the central abdomen measuring up to 4.3 cm in diameter. Multiple air-fluid levels are noted on the left lateral decubitus view. No frank pneumoperitoneum. Paucity of distal colonic gas, although there is a small amount of rectal gas present. Nasogastric tube appears coiled in the stomach. 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  IMPRESSION: 1. Bowel gas pattern remains compatible with a small bowel obstruction. 2. No pneumoperitoneum at this time. 3. Nasogastric tube remains coiled in the stomach.   Electronically Signed   By: Vinnie Langton M.D.   On: 09/28/2013 10:38   Dg Abd 2 Views  09/27/2013   CLINICAL DATA:  Mid abdominal pain, back pain  EXAM: ABDOMEN - 2 VIEW  COMPARISON:  DG ABD PORTABLE 1V dated 09/26/2013  FINDINGS: Multiple  dilated loops small bowel appreciated throughout the abdomen. Air-fluid levels identified. There is no evidence of free air. A moderate amount of stools appreciated region of the ascending colon. There is a paucity of distal bowel gas. The NG tube is seen with tip curled in the region of the stomach. When compared to previous study the dilated loops of small bowel have decreased in size and distribution.  IMPRESSION: Findings consistent with an improving small bowel obstruction. There is no evidence of free air.   Electronically Signed   By: Margaree Mackintosh M.D.   On: 09/27/2013 13:27   Dg Abd Portable 1v  10/02/2013   CLINICAL DATA:  Evaluate for resolution of ileus  EXAM: PORTABLE ABDOMEN - 1 VIEW  COMPARISON:  DG ABD PORTABLE 1V dated 10/01/2013; DG ABD PORTABLE 2V dated 09/29/2013; CT ABD/PELV WO CM dated 09/25/2013  FINDINGS: There is a paucity of bowel gas. There is persistent moderate gas distention of several mildly dilated loops of rather featureless appearing small bowel within the left mid hemi abdomen though there is decreased gaseous distention of these bowel loops. A minimal amount of air is seen within the ascending and descending colon.  Nondiagnostic evaluation pneumoperitoneum. An enteric tube and side port overlie the expected location of the mid body of the stomach.  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 Post bilateral total hip replacements, incompletely imaged.  IMPRESSION: 1. Findings most suggestive of improving ileus, though note, there is persistent mild gas distention of several featureless loops of small bowel within the mid  abdomen. Clinical correlation is advised. 2. Sequela of ankylosing spondylitis   Electronically Signed   By: Sandi Mariscal M.D.   On: 10/02/2013 08:21   Dg Abd Portable 1v  10/01/2013   CLINICAL DATA:  Distension.  Recent hemicolectomy with colostomy.  EXAM: PORTABLE ABDOMEN - 1 VIEW  COMPARISON:  09/29/2013 and  09/28/2013  FINDINGS: Central venous catheter unchanged with tip over the right atrium. Nasogastric tube is present with tip over the midline upper abdomen likely over the distal stomach. There is continued evidence of multiple air-filled dilated small bowel loops. There is minimal air within the colon. There is continued evidence of an unusual air collection in the right upper quadrant as cannot exclude free peritoneal air as seen on the prior study. Remainder of the exam is unchanged.  IMPRESSION: Persistent air-filled dilated small bowel loops with paucity of colonic gas. Findings suggesting small bowel obstructive process. Persistent unusual air collection over the right upper quadrant as this may represent free peritoneal air as seen previously likely due to patient's recent hemicolectomy.  Nasogastric tube with tip over the mid abdomen likely in the distal stomach.  Findings discussed with patient's nurse, Morey Hummingbird, at the time of dictation.   Electronically Signed   By: Marin Olp M.D.   On: 10/01/2013 14:56   Dg Abd Portable 1v  09/26/2013   CLINICAL DATA:  Small bowel obstruction, abdominal pain and distention  EXAM: PORTABLE ABDOMEN - 1 VIEW  COMPARISON:  Prior abdominal radiograph 09/25/2013  FINDINGS: Persistent gaseous distension of the stomach and multiple loops of small bowel. The tip of the nasogastric tube projects over the gastric bubble, however the proximal side hole is in the region of the gastroesophageal junction. The degree of small-bowel distention is not significantly changed since the prior study. Maximal small bowel diameter is again 5 cm. No large free air on this single supine radiograph. Surgical changes of prior median sternotomy and bilateral total hip arthroplasties. The bones are osteopenic. Atherosclerotic vascular calcifications noted in the aorta.  IMPRESSION: 1. No significant interval change in the appearance of high-grade small bowel obstruction. Maximal small bowel  diameter is again 5 cm. 2. The proximal side port of the nasogastric tube is in the GE junction. Consider advancing 5 cm for more optimal placement.   Electronically Signed   By: Jacqulynn Cadet M.D.   On: 09/26/2013 07:35   Dg Abd Portable 1v  09/25/2013   CLINICAL DATA:  Nasogastric tube placement  EXAM: PORTABLE ABDOMEN - 1 VIEW  COMPARISON:  CT abdomen September 25, 2013  FINDINGS: Nasogastric tube is identified with distal tip in the mid stomach. The stomach is air-filled and distended. There are multiple dilated small bowel loops.  IMPRESSION: Naso gastric tube distal tip in the mid stomach. Small bowel obstruction.   Electronically Signed   By: Abelardo Diesel M.D.   On: 09/25/2013 22:56   Dg Abd Portable 2v  10/06/2013   CLINICAL DATA:  Severe abdominal distention and pain. Postop from right hemicolectomy on 09/29/2013.  EXAM: PORTABLE ABDOMEN - 2 VIEW  COMPARISON:  10/02/2013  FINDINGS: Increased dilatation of small bowel loops seen. A moderate amount of free intraperitoneal air is also demonstrated and appears increased since previous study. Previous seen nasogastric tube is no longer visualized.  IMPRESSION: Increased small bowel dilatation and free intraperitoneal air since prior study, highly suspicious for anastomotic leak or perforated viscus.  Critical Value/emergent results were called by telephone at the time of interpretation on 10/06/2013  at 2:01 PM to the patient's floor nurse Jonni Sanger, who verbally acknowledged these results.   Electronically Signed   By: Earle Gell M.D.   On: 10/06/2013 14:04   Dg Abd Portable 2v  09/29/2013   CLINICAL DATA:  Status post colonoscopy. Question free intraperitoneal air.  EXAM: PORTABLE ABDOMEN - 2 VIEW  COMPARISON:  CT abdomen and pelvis 09/25/2013.  FINDINGS: There is a large volume of free intraperitoneal air. NG tube is in place with tip in the stomach.  IMPRESSION: Study is positive for free intraperitoneal air.  Critical Value/emergent results were called  by telephone at the time of interpretation on 09/29/2013 at 12:27 PM to Dr. Ronald Lobo , who verbally acknowledged these results.   Electronically Signed   By: Inge Rise M.D.   On: 09/29/2013 12:30   Ir Shuntogram/ Fistulagram Left Mod Sed  09/28/2013   CLINICAL DATA:  End stage renal disease, poor function of left arm dialysis fistula.  TECHNIQUE: DIALYSIS SHUNTOGRAM  The procedure, risks (including but not limited to bleeding, infection, organ damage ), benefits, and alternatives were explained to the patient. Questions regarding the procedure were encouraged and answered. The patient understands and consents to the procedure.  In preparation for possible declot procedure, the skin overlying the left forearm fistula was prepped with Betadine, draped in usual sterile fashion, infiltrated locally with 1% lidocaine.  Intravenous Fentanyl and Versed were administered as conscious sedation during continuous cardiorespiratory monitoring by the radiology RN, with a total moderate sedation time of eighty minutes.  The outflow vein from the fistula was accessed just central to the fistula with a 21 gauge micropuncture needle under real-time ultrasound guidance. Needle was exchanged over a 018 guide wire for a 4 French transitional dilator. This was used for outflow venography.  The outflow vein was then accessed more centrally retrograde with a 21 gauge micropuncture needle under real-time ultrasound guidance. The needle was exchanged over the 018 wire for the transitional dilator, which allow placement of a Bentson wire. Over this, a 5 [place] Kumpe catheter was advanced and used to negotiate the arterial anastomosis for additional fistulography. Attempts were made to catheterize the proximal radial artery using a variety of catheter & guidewire combinations but these were ultimately unsuccessful. Assessment of the arterial anatomy of the forearm was performed with a blood pressure cuff at the upper arm level.   Ultimately, No declot procedure was needed. The access sites were removed and hemostasis achieved with manual compression.  No immediate complication.  COMPARISON:  None  FLUOROSCOPY TIME:  10 min 12 seconds  ACCESS: Proximal left radial artery occlusion. Not amenable to percutaneous intervention. Consider surgical consultation.  FINDINGS: The outflow cephalic vein from the fistula is widely patent. Central venous system through the SVC is patent. There is a competing outflow vein at the level of the midforearm. There is stenosis at the arterial anastomosis. The proximal radial artery is occluded, not opacified central to the level of the fistula. There is retrograde flow in the distal radial artery supplied by palmar arches, supplying low-grade flow across the fistula.  IMPRESSION: 1. Proximal left radial artery occlusion. Collateral flow via palmar arches reconstitutes the distal radial artery, with retrograde flow into the fistula, at an inadequate rate. This is not amenable to percutaneous intervention. After telephone consultation with Dr. Jonnie Finner, plan made to proceed with hemodialysis catheter placement.   Electronically Signed   By: Arne Cleveland M.D.   On: 09/28/2013 14:17   Mr Outside Films  Spine  09/09/2013   This examination belongs to an outside facility and is stored  here for comparison purposes only.  Contact the originating outside  institution for any associated report or interpretation.        Subjective: Patient complains of abdominal pain in the upper quadrants today. There is no chest pain, shortness breath, vomiting, diarrhea. He is passing flatus. Has chronic back pain.  Objective: Filed Vitals:   10/06/13 0405 10/06/13 0621 10/06/13 1014 10/06/13 1500  BP: 98/65 116/64 137/85 141/74  Pulse: 89 87 98 97  Temp: 98.3 F (36.8 C)  97.8 F (36.6 C) 97.2 F (36.2 C)  TempSrc: Oral   Oral  Resp: 20  19 20   Height:      Weight:      SpO2: 94%  99% 98%     Intake/Output Summary (Last 24 hours) at 10/06/13 1624 Last data filed at 10/06/13 1521  Gross per 24 hour  Intake    360 ml  Output    150 ml  Net    210 ml   Weight change: 0.9 kg (1 lb 15.8 oz) Exam:   General:  Pt is alert, follows commands appropriately, not in acute distress  HEENT: No icterus, No thrush,[place]/AT  Cardiovascular: RRR, S1/S2, no rubs, no gallops  Respiratory: Diminished breath sounds at the bases. No wheezing. Good air movement.  Abdomen: Soft/+BS, diffuse tenderness with some rebound tenderness, non distended, no guarding  Extremities: No edema, No lymphangitis, No petechiae, No rashes, no synovitis  Data Reviewed: Basic Metabolic Panel:  Recent Labs Lab 09/29/13 2045 10/01/13 0400 10/02/13 0357 10/03/13 0400 10/04/13 1030 10/05/13 0540  NA 134* 135* 140 137  --  141  K 3.2* 4.2 3.1* 3.4* 3.9 3.8  CL 97 100 102 99  --  104  CO2 18* 20 22 22   --  26  GLUCOSE 193* 109* 93 94  --  106*  BUN 25* 34* 14 21  --  16  CREATININE 3.27* 3.95* 2.17* 2.92*  --  2.57*  CALCIUM 7.1* 6.9* 7.0* 7.3*  --  7.5*  PHOS  --   --   --  4.0  --  3.2   Liver Function Tests:  Recent Labs Lab 10/03/13 0400 10/05/13 0540  ALBUMIN 1.5* 1.4*   No results found for this basename: LIPASE, AMYLASE,  in the last 168 hours No results found for this basename: AMMONIA,  in the last 168 hours CBC:  Recent Labs Lab 10/01/13 0400 10/02/13 0357 10/03/13 0400 10/05/13 0540 10/06/13 0525  WBC 23.3* 15.4* 15.8* 12.7* 14.3*  HGB 9.1* 8.1* 8.7* 9.1* 9.2*  HCT 28.1* 25.3* 27.5* 28.9* 29.4*  MCV 94.3 94.4 95.2 96.0 96.7  PLT 254 212 265 329 363   Cardiac Enzymes: No results found for this basename: CKTOTAL, CKMB, CKMBINDEX, TROPONINI,  in the last 168 hours BNP: No components found with this basename: POCBNP,  CBG:  Recent Labs Lab 10/05/13 1220 10/05/13 1713 10/05/13 2148 10/06/13 0024 10/06/13 0756  GLUCAP 81 77 63* 73 128*    Recent Results (from the  past 240 hour(s))  CLOSTRIDIUM DIFFICILE BY PCR     Status: None   Collection Time    10/04/13 10:17 AM      Result Value Ref Range Status   C difficile by pcr NEGATIVE  NEGATIVE Final     Scheduled Meds: . acetaminophen  1,000 mg Oral TID  . antiseptic oral rinse  1 application Mouth Rinse QID  .  calcitonin (salmon)  1 spray Alternating Nares Daily  . chlorhexidine  15 mL Mouth/Throat BID  . [START ON 10/10/2013] darbepoetin  150 mcg Intravenous Q Mon-HD  . doxercalciferol  2 mcg Intravenous Q M,W,F-HD  . ertapenem  500 mg Intravenous Q24H  . feeding supplement (NEPRO CARB STEADY)  237 mL Oral Q24H  . feeding supplement (RESOURCE BREEZE)  1 Container Oral BID BM  . [START ON 10/07/2013] ferric gluconate (FERRLECIT/NULECIT) IV  62.5 mg Intravenous Q Fri-HD  . gabapentin  300 mg Oral Daily  . hydrALAZINE  10 mg Oral Once  . lidocaine  1 patch Transdermal Q24H  . methocarbamol  750 mg Oral QID  . midodrine  10 mg Oral BID WC  . multivitamin  1 tablet Oral QHS  . pantoprazole  40 mg Oral Q1200  . psyllium  1 packet Oral BID  . traMADol  50 mg Oral Q12H   Continuous Infusions: . sodium chloride Stopped (10/03/13 1300)     Rosario Duey, DO  [facility] Pager [phone]  If 7PM-7AM, please contact night-coverage [email or web address] Password [email or web address] 10/06/2013, 4:24 PM   LOS: 11 days

## 2013-10-06 NOTE — Progress Notes (Signed)
CRITICAL VALUE ALERT  Critical value received:  Abd X-ray showed an increase in free air  Date of notification:  10-06-2013  Time of notification:  14:04  Critical value read back:yes  Nurse who received alert:  Genelle Bal, RN  MD notified (1st page):  Tat  Time of first page:  14:05

## 2013-10-06 NOTE — Progress Notes (Signed)
Pt seen around 5pm Reports less pain. No n/v.  Midline wound intact - no enteric contents Ostomy ok TTP along left side. No RT/peritonitis.   Xray reviewed which does show a fair amount of free air.  Wbc slightly up today  Currently stable but will check CT a/p with oral contrast to investigate.  Leighton Ruff. Redmond Pulling, MD, FACS General, Bariatric, & Minimally Invasive Surgery [facility], [place]

## 2013-10-06 NOTE — Progress Notes (Signed)
7 Days Post-Op  Subjective: Pt c/o pain.  He says the pain meds aren't working, tearful and sarcastically laughing.  He's guarding his abdomen with a pillow.  No N/V.  Ostomy with flatus and BM, just changed this am.  Appetite low, but says he vomited this am but no report of that in epic.  C/o dry mouth.  Objective: Vital signs in last 24 hours: Temp:  [97.2 F (36.2 C)-98.7 F (37.1 C)] 97.8 F (36.6 C) (02/19 1014) Pulse Rate:  [80-99] 98 (02/19 1014) Resp:  [16-22] 19 (02/19 1014) BP: (98-181)/(64-104) 137/85 mmHg (02/19 1014) SpO2:  [91 %-100 %] 99 % (02/19 1014) Weight:  [150 lb 5.7 oz (68.2 kg)] 150 lb 5.7 oz (68.2 kg) (02/18 1122) Last BM Date: 10/05/13  Intake/Output from previous day: 02/18 0701 - 02/19 0700 In: 0  Out: 751 [Stool:150] Intake/Output this shift: Total I/O In: 120 [P.O.:120] Out: -    PE: Gen:  Alert, NAD, pleasant Abd: Soft, mild distension, exquisitely tender to palpation, +BS, no HSM, midline dressing not taken down due to patients exquisite tenderness and frustration with it just being changed, ostomy functioning properly, flatus and BM in bag.   Lab Results:   Recent Labs  10/05/13 0540 10/06/13 0525  WBC 12.7* 14.3*  HGB 9.1* 9.2*  HCT 28.9* 29.4*  PLT 329 363   BMET  Recent Labs  10/04/13 1030 10/05/13 0540  NA  --  141  K 3.9 3.8  CL  --  104  CO2  --  26  GLUCOSE  --  106*  BUN  --  16  CREATININE  --  2.57*  CALCIUM  --  7.5*   PT/INR No results found for this basename: LABPROT, INR,  in the last 72 hours CMP     Component Value Date/Time   NA 141 10/05/2013 0540   K 3.8 10/05/2013 0540   CL 104 10/05/2013 0540   CO2 26 10/05/2013 0540   GLUCOSE 106* 10/05/2013 0540   BUN 16 10/05/2013 0540   CREATININE 2.57* 10/05/2013 0540   CALCIUM 7.5* 10/05/2013 0540   PROT 5.7* 09/25/2013 1621   ALBUMIN 1.4* 10/05/2013 0540   AST 11 09/25/2013 1621   ALT 6 09/25/2013 1621   ALKPHOS 138* 09/25/2013 1621   BILITOT 0.5 09/25/2013 1621   GFRNONAA 24* 10/05/2013 0540   GFRAA 27* 10/05/2013 0540   Lipase     Component Value Date/Time   LIPASE 29 10/22/2012 0900       Studies/Results: No results found.  Anti-infectives: Anti-infectives   Start     Dose/Rate Route Frequency Ordered Stop   09/30/13 1400  ertapenem (INVANZ) 0.5 g in sodium chloride 0.9 % 50 mL IVPB     500 mg 100 mL/hr over 30 Minutes Intravenous Every 24 hours 09/29/13 2202     09/29/13 1415  ertapenem (INVANZ) 1 g in sodium chloride 0.9 % 50 mL IVPB     1 g 100 mL/hr over 30 Minutes Intravenous  Once 09/29/13 1159 09/29/13 1412   09/29/13 0600  cefUROXime (ZINACEF) 1.5 g in dextrose 5 % 50 mL IVPB  Status:  Discontinued     1.5 g 100 mL/hr over 30 Minutes Intravenous On call to O.R. 09/28/13 1711 09/29/13 2203   09/28/13 1200  ceFAZolin (ANCEF) IVPB 2 g/50 mL premix    Comments:  Tube to 17   2 g 100 mL/hr over 30 Minutes Intravenous  Once 09/28/13 1155 09/28/13 1246  09/27/13 1200  vancomycin (VANCOCIN) IVPB 1000 mg/200 mL premix  Status:  Discontinued     1,000 mg 200 mL/hr over 60 Minutes Intravenous Every M-W-F (Hemodialysis) 09/26/13 1638 09/26/13 1721   09/26/13 1745  metroNIDAZOLE (FLAGYL) IVPB 500 mg  Status:  Discontinued     500 mg 100 mL/hr over 60 Minutes Intravenous Every 8 hours 09/26/13 1722 10/03/13 1112   09/26/13 1200  vancomycin (VANCOCIN) IVPB 750 mg/150 ml premix  Status:  Discontinued     750 mg 150 mL/hr over 60 Minutes Intravenous Every M-W-F (Hemodialysis) 09/25/13 2344 09/26/13 1051   09/26/13 1200  vancomycin (VANCOCIN) IVPB 1000 mg/200 mL premix  Status:  Discontinued     1,000 mg 200 mL/hr over 60 Minutes Intravenous Every M-W-F (Hemodialysis) 09/26/13 1052 09/26/13 1638   09/26/13 0600  piperacillin-tazobactam (ZOSYN) IVPB 2.25 g  Status:  Discontinued     2.25 g 100 mL/hr over 30 Minutes Intravenous 3 times per day 09/25/13 2126 09/26/13 1721   09/26/13 0000  vancomycin (VANCOCIN) 500 mg in sodium chloride  irrigation 0.9 % 100 mL ENEMA  Status:  Discontinued     500 mg Rectal 4 times per day 09/25/13 2337 09/26/13 1722   09/25/13 2130  vancomycin (VANCOCIN) IVPB 750 mg/150 ml premix     750 mg 150 mL/hr over 60 Minutes Intravenous  Once 09/25/13 2124 09/26/13 0145   09/25/13 2000  piperacillin-tazobactam (ZOSYN) IVPB 3.375 g     3.375 g 100 mL/hr over 30 Minutes Intravenous  Once 09/25/13 1853 09/25/13 2002   09/25/13 2000  vancomycin (VANCOCIN) IVPB 1000 mg/200 mL premix     1,000 mg 200 mL/hr over 60 Minutes Intravenous  Once 09/25/13 1859 09/25/13 2113       Assessment/Plan 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 Grandville Silos 09/29/13 Severe colitis  Mucosal hemorrhage/ulceration  -colonoscopy biopsy report shows adenocarcinoma. 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  Discussed results of pathology report and the patient is acting appropriately -tolerating diet, but poor oral intake. Consult to dietician  -continue antibiotics. Indication: perforated bowel, c diff. invanz D#7/7, discontinue after today's dose, recheck CBC in am -monitor stoma  -The patient seems to have depressed mood and significant pain which are limiting his motivation to ambulate and use his IS -BID wet to dry dressing changes  -Tramadol BID, add robaxin, scheduled tylenol for better pain control -Oncology Dr. Hans Eden is arranging outpatient follow up -Will obtain a KUB to check for free air, may need CT scan if pain continues  Recent back fx  ESRD on HD  CAD  DM      LOS: 11 days    DORT, Lanecia Sliva 10/06/2013, 11:12 AM Pager: [phone]

## 2013-10-06 NOTE — Progress Notes (Signed)
Pt refused removal of lidoderm patch; stated that he could not turn due to back pain.  Will continue to try to remove.

## 2013-10-06 NOTE — Telephone Encounter (Signed)
New patient scheduled for 03/05 @ 10:30 w/Dr. Alen Blew. Welcome packet mailed.

## 2013-10-06 NOTE — Progress Notes (Signed)
BP 181/104 at 0053.  No c/o pain, headache, or other symptoms.  MD notified; hydralazine 10 mg IV first ordered, then switched to hydralazine PO due to shortage in pharmacy.  Will continue to assess.

## 2013-10-06 NOTE — Progress Notes (Signed)
Hypoglycemic Event  CBG: 63  Treatment: 15 GM carbohydrate snack  Symptoms: None  Follow-up CBG: Time:0024 CBG Result:73  Possible Reasons for Event: Unknown  Comments/MD notified: Pt had no complaints.  MD notified.  Will continue to assess.    Nicholas [name]  Remember to initiate Hypoglycemia Order Set & complete

## 2013-10-06 NOTE — Progress Notes (Signed)
[facility] Progress Note  Subjective:   Tearful, 9/10 abdominal pain. "Hurts to move. Feels like a dagger." Reports pain meds last only about 40 minutes. RN present, states primary is aware and will order adjunct analgesia.  Objective Filed Vitals:   10/06/13 0342 10/06/13 0405 10/06/13 0621 10/06/13 1014  BP: 128/64 98/65 116/64 137/85  Pulse:  89 87 98  Temp:  98.3 F (36.8 C)  97.8 F (36.6 C)  TempSrc:  Oral    Resp:  20  19  Height:      Weight:      SpO2:  94%  99%   Physical Exam General: Alert, cooperative, in obvious discomfort Heart: RRR, no murmur or rub Lungs: CTA bilat/ anterior exam due to pain with movement. No appreciable wheezes/rhonchi Abdomen: Distended, +guarding, + BS Extremities: No LE edema Dialysis Access: R I-J [facility]  Dialysis: MWF [facility]  4h 69kg 2K//2.25Bath Heparin 5000 LUE AVF (failed now, has R IJ cath)  Hectorol 2 Epo 5000 Venofer (weekly, started this admit) Last labs: Hb 9.9 last pth 296   Assessment/Plan: 1. Bowel obstruction - s/p R colectomy, repair of serosal tears, & ileostomy 2/12. Cecal mass positive for invasive adenocarcinoma, mod differentiated, benign for inflamed proximal and transverse segments per pathology report. Mgmt per surgery. On Invanz. Continue No heparin HD.   2. ESRD - MWF, K 3.8 , HD tomorrow with added K+ bath 3. HTN/Volume - SBPs 110s on Midodrine BID; Poor intake, under edw at times. UF as tolerated 4. Anemia - Hgb 9.2, slowly improving. Continue Aranesp 150 q Mon. Tsat 27% s/p op Fe load completed 1/21. Ferritin 803. Now on weekly Fe. Follow labs, transfuse prn 5. 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  Renvela 1 ac held last admit for low Phos. If phos climbs, may need alternate binder given present GI issues. Cont Vit D  6. Malnutrition - Soft diet, ONS. Resume multivitamin  7. L1 vertebral fx/Ankylosing spondylitis - sec to fall, managed non-operatively.  8. Hx CAD/A-fib - no anticoagulation sec  to Hx GIB 9. HD access - using right TDC. LFA AVF unusable due to inflow failure. VVS consulted for permanent access. New LUA AVF on hold pending improvement in current issues. 10. Dispo - Pending improvement in current issues. Wants to return to [facility].  Collene Leyden. Cletus Gash, PA-C [facility] Pager [phone] 10/06/2013,10:16 AM  LOS: 11 days  Renal Attending Post op colon resection. On HD MWF> Next HD in AM, per Surgery and Oncology and Hospitalist [facility] C    Additional Objective Labs: Basic Metabolic Panel:  Recent Labs Lab 10/02/13 0357 10/03/13 0400 10/04/13 1030 10/05/13 0540  NA 140 137  --  141  K 3.1* 3.4* 3.9 3.8  CL 102 99  --  104  CO2 22 22  --  26  GLUCOSE 93 94  --  106*  BUN 14 21  --  16  CREATININE 2.17* 2.92*  --  2.57*  CALCIUM 7.0* 7.3*  --  7.5*  PHOS  --  4.0  --  3.2   Liver Function Tests:  Recent Labs Lab 10/03/13 0400 10/05/13 0540  ALBUMIN 1.5* 1.4*   CBC:  Recent Labs Lab 10/01/13 0400 10/02/13 0357 10/03/13 0400 10/05/13 0540 10/06/13 0525  WBC 23.3* 15.4* 15.8* 12.7* 14.3*  HGB 9.1* 8.1* 8.7* 9.1* 9.2*  HCT 28.1* 25.3* 27.5* 28.9* 29.4*  MCV 94.3 94.4 95.2 96.0 96.7  PLT 254 212 265 329 363  CBG:  Recent Labs Lab 10/05/13 1220 10/05/13 1713 10/05/13 2148 10/06/13 0024 10/06/13 0756  GLUCAP 81 77 63* 73 128*   Iron Studies:  Recent Labs  10/03/13 1400  IRON 17*  TIBC 63*  FERRITIN 803*    Studies/Results: No results found.  Medications: . sodium chloride Stopped (10/03/13 1300)   . acetaminophen  1,000 mg Oral TID  . antiseptic oral rinse  1 application Mouth Rinse QID  . calcitonin (salmon)  1 spray Alternating Nares Daily  . chlorhexidine  15 mL Mouth/Throat BID  . [START ON 10/10/2013] darbepoetin  150 mcg Intravenous Q Mon-HD  . doxercalciferol  2 mcg Intravenous Q M,W,F-HD  . ertapenem  500 mg Intravenous Q24H  . feeding supplement (NEPRO CARB STEADY)  237 mL Oral Q24H  .  feeding supplement (RESOURCE BREEZE)  1 Container Oral BID BM  . [START ON 10/07/2013] ferric gluconate (FERRLECIT/NULECIT) IV  62.5 mg Intravenous Q Fri-HD  . gabapentin  300 mg Oral Daily  . hydrALAZINE  10 mg Oral Once  . lidocaine  1 patch Transdermal Q24H  . midodrine  10 mg Oral BID WC  . pantoprazole  40 mg Oral Q1200  . psyllium  1 packet Oral BID  . traMADol  50 mg Oral Q12H

## 2013-10-07 ENCOUNTER — Telehealth: Payer: Self-pay | Admitting: Oncology

## 2013-10-07 LAB — RENAL FUNCTION PANEL
ALBUMIN: 1.5 g/dL — AB (ref 3.5–5.2)
BUN: 14 mg/dL (ref 6–23)
CALCIUM: 7.6 mg/dL — AB (ref 8.4–10.5)
CHLORIDE: 99 meq/L (ref 96–112)
CO2: 28 mEq/L (ref 19–32)
CREATININE: 2.44 mg/dL — AB (ref 0.50–1.35)
GFR calc Af Amer: 29 mL/min — ABNORMAL LOW (ref >90)
GFR calc non Af Amer: 25 mL/min — ABNORMAL LOW (ref >90)
Glucose, Bld: 70 mg/dL (ref 70–99)
Phosphorus: 3.3 mg/dL (ref 2.3–4.6)
Potassium: 4.2 mEq/L (ref 3.7–5.3)
Sodium: 138 mEq/L (ref 137–147)

## 2013-10-07 LAB — CBC
HEMATOCRIT: 30.6 % — AB (ref 39.0–52.0)
Hemoglobin: 9.6 g/dL — ABNORMAL LOW (ref 13.0–17.0)
MCH: 30.5 pg (ref 26.0–34.0)
MCHC: 31.4 g/dL (ref 30.0–36.0)
MCV: 97.1 fL (ref 78.0–100.0)
Platelets: 411 10*3/uL — ABNORMAL HIGH (ref 150–400)
RBC: 3.15 MIL/uL — ABNORMAL LOW (ref 4.22–5.81)
RDW: 23.3 % — ABNORMAL HIGH (ref 11.5–15.5)
WBC: 14.6 10*3/uL — ABNORMAL HIGH (ref 4.0–10.5)

## 2013-10-07 LAB — GLUCOSE, CAPILLARY
Glucose-Capillary: 97 mg/dL (ref 70–99)
Glucose-Capillary: 138 mg/dL — ABNORMAL HIGH (ref 70–99)

## 2013-10-07 MED ORDER — ALTEPLASE 2 MG IJ SOLR: 2.0000 mg | Freq: Once | INTRAMUSCULAR | Status: DC | PRN

## 2013-10-07 MED ORDER — NEPRO/CARBSTEADY PO LIQD: 237.0000 mL | ORAL | Status: DC | PRN

## 2013-10-07 MED ORDER — SODIUM CHLORIDE 0.9 % IV SOLN: 100.0000 mL | INTRAVENOUS | Status: DC | PRN

## 2013-10-07 MED ORDER — DOXERCALCIFEROL 4 MCG/2ML IV SOLN
INTRAVENOUS | Status: AC
  Administered 2013-10-07: 2 ug via INTRAVENOUS
  Filled 2013-10-07: qty 2

## 2013-10-07 MED ORDER — LIDOCAINE-PRILOCAINE 2.5-2.5 % EX CREA: 1.0000 "application " | TOPICAL_CREAM | CUTANEOUS | Status: DC | PRN

## 2013-10-07 MED ORDER — HEPARIN SODIUM (PORCINE) 1000 UNIT/ML DIALYSIS: 1000.0000 [IU] | INTRAMUSCULAR | Status: DC | PRN

## 2013-10-07 MED ORDER — GABAPENTIN 300 MG PO CAPS
300.0000 mg | ORAL_CAPSULE | Freq: Every day | ORAL | Status: DC
  Administered 2013-10-07 – 2013-10-12 (×6): 300 mg via ORAL
  Filled 2013-10-07 (×6): qty 1

## 2013-10-07 MED ORDER — HEPARIN SODIUM (PORCINE) 1000 UNIT/ML DIALYSIS: 3000.0000 [IU] | Freq: Once | INTRAMUSCULAR | Status: DC

## 2013-10-07 MED ORDER — PENTAFLUOROPROP-TETRAFLUOROETH EX AERO: 1.0000 "application " | INHALATION_SPRAY | CUTANEOUS | Status: DC | PRN

## 2013-10-07 MED ORDER — LIDOCAINE HCL (PF) 1 % IJ SOLN: 5.0000 mL | INTRAMUSCULAR | Status: DC | PRN

## 2013-10-07 MED ORDER — MIDODRINE HCL 5 MG PO TABS
ORAL_TABLET | ORAL | Status: AC
  Administered 2013-10-07: 10 mg via ORAL
  Filled 2013-10-07: qty 2

## 2013-10-07 MED ORDER — OXYCODONE HCL ER 15 MG PO T12A
15.0000 mg | EXTENDED_RELEASE_TABLET | Freq: Two times a day (BID) | ORAL | Status: DC
  Administered 2013-10-07 – 2013-10-09 (×4): 15 mg via ORAL
  Filled 2013-10-07 (×4): qty 1

## 2013-10-07 MED ORDER — HEPARIN SODIUM (PORCINE) 1000 UNIT/ML DIALYSIS: 20.0000 [IU]/kg | INTRAMUSCULAR | Status: DC | PRN

## 2013-10-07 MED ORDER — OXYCODONE HCL 5 MG PO TABS
ORAL_TABLET | ORAL | Status: AC
  Filled 2013-10-07: qty 1

## 2013-10-07 NOTE — Progress Notes (Signed)
PT Cancellation Note  Patient Details Name: Nicholas [name] MRN: [id number] DOB: May 13, 1941   Cancelled Treatment:    Reason Eval/Treat Not Completed: Fatigue/lethargy limiting ability to participate; patient reports unable to participate in PT this afternoon due to fatigue from dialysis and plans to try getting up tomorrow afternoon.  Encouraged participation today due to limited therapy available on weekend.  Patient continued to decline due to simply unable to participate.  Will attempt at later date.   Vala Raffo,CYNDI 10/07/2013, 2:46 PM

## 2013-10-07 NOTE — Progress Notes (Signed)
Pt encouraged to ambulate with nursing staff in the halls. Pt told me that he made a deal with the doctors, and told them that he didn't want to go walking today, but he promised that he would do it on Saturday and Sunday. I educated pt on why ambulation is so important, pt says he knows, and he will start walking tomorrow.

## 2013-10-07 NOTE — Telephone Encounter (Signed)
LEFT MESSAGE FOR PATIENT TO RETURN CALL  °

## 2013-10-07 NOTE — Progress Notes (Signed)
Stable hemodynamically.  Postop colon surgery for metastatic adeno ca. We will need to lower EDW as needed and tolerated post op.Nicholas [name] C

## 2013-10-07 NOTE — Progress Notes (Signed)
When I saw patient this afternoon he was complaining of hurting all over his entire body. Nontoxic. Abdomen-soft, nondistended. Air an ostomy bag. Not terribly tender  CT reviewed  No significant findings on CT. No sign of enteric leak or abscess Resume diet PT, OT Calorie count Measure ostomy output  Nicholas [name]. Redmond Pulling, MD, FACS General, Bariatric, & Minimally Invasive Surgery [facility], [place]

## 2013-10-07 NOTE — Progress Notes (Signed)
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  Right colon perforation  -Repeat abdominal x-ray 10/06/13 showed increased free air -10/06/2013 CT abdomen and pelvis shows moderate left effusion with atelectasis with right basilar opacity -appreciate surgery followup  -Restart soft diet per surgery -mobilize  -Patient finished 7 days of ertapenem 10/06/13 Right basilar opacity -Doubt pneumonia -No fevers or respiratory distress or shortness of breath -Patient finished 7 days of ertapenem 10/06/13 L1 vertebral fx/Ankylosing spondylitis/uncontrolled pain  - sec to fall, managed non-operatively  -although patient has identifiable source of pain, the patient was soundly sleeping when I visited him x 2  -Seems to have some improvement with Robaxin -Add long acting opioid--> OxyContin 15 mg every 12 hours Adenocarcinoma of colon  -spoke with [facility]--Dr. 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  -f/u CBC  -transfuse for Hb < 7  -SCD for DVT prevention  Family Communication: Pt at beside  Disposition Plan: Home when medically stable        Procedures/Studies: Ct Abdomen Pelvis Wo Contrast  10/06/2013   CLINICAL DATA:  Status post colectomy for colon cancer.  EXAM: CT ABDOMEN AND PELVIS WITHOUT CONTRAST  TECHNIQUE: Multidetector CT imaging of the abdomen and pelvis was performed following the standard protocol without intravenous contrast.  COMPARISON:  CT scan of September 25, 2013.  FINDINGS: L1 compression fracture is again noted. Moderate left pleural effusion is noted with left lower lobe atelectasis. Status post bilateral hip arthroplasties. Mild right basilar opacity is noted concerning for pneumonia or atelectasis with associated pleural effusion.  No focal abnormality is seen in the liver, spleen or pancreas on these unenhanced images. Dense sludge is noted in the gallbladder. Fluid is noted around the liver and spleen, as well as in the pelvis consistent with ascites. Colostomy is noted in the right lower quadrant and patient is status post right colectomy. Mild pneumoperitoneum is noted in the epigastric region. Midline surgical incision is noted. Small bowel dilatation is noted which may represent postoperative ileus. Bilateral renal atrophy is noted. Stable rounded and exophytic hyperdense and hypodense abnormalities are seen involving both kidneys most likely representing cysts. Renal ultrasound may be performed for further evaluation. Evaluation of the pelvis is limited due to scatter artifact from previously described bilateral hip arthroplasties. Atherosclerotic calcifications of abdominal aorta are noted without aneurysm formation. Adrenal glands appear normal.  IMPRESSION: L1 compression fracture is again noted.  Moderate left pleural effusion is noted with left lower  lobe atelectasis. Mild right  basilar opacity is noted concerning for pneumonia or atelectasis.  Mild ascites is noted.  Status post right hemicolectomy with colostomy seen in right lower quadrant. Mildly dilated small bowel loops are noted most consistent with postoperative ileus, but distal obstruction cannot be excluded.  Stable rounded and exophytic hyperdense and hypodense abnormalities are seen involving both kidneys which may represent cysts; renal ultrasound is recommended for further evaluation.  Bilateral renal atrophy is noted.  Mild pneumoperitoneum is noted in the epigastric region most likely postoperative in nature.   Electronically Signed   By: Sabino Dick M.D.   On: 10/06/2013 21:16   Ct Abdomen Pelvis Wo Contrast  09/25/2013   CLINICAL DATA:  Vomiting, abdominal distention, constipation  EXAM: CT ABDOMEN AND PELVIS WITHOUT CONTRAST  TECHNIQUE: Multidetector CT imaging of the abdomen and pelvis was performed following the standard protocol without intravenous contrast.  COMPARISON:  None.  FINDINGS: There are multiple dilated small bowel loops throughout the abdomen and pelvis. In the right mid abdomen, there is a small bowel loop with the extensive bowel content and crecent air anteriorly; this is more likely due to air surrounding stool and less likely due to pneumatosis. There is bowel wall thickening in decompressed ascending, transverse and descending colon.  The liver, spleen, pancreas are normal. The gallbladder is distended. The adrenal glands are normal. The kidneys are atrophic. There are nonobstructing left kidney stones. There are small high and low density lesions in both kidneys, evaluation is limited without contrast. There is atherosclerosis of the abdominal aorta without aneurysmal dilatation. There is no abdominal lymphadenopathy.  Evaluation is decompressed bladder is limited by metallic artifact from the bilateral hip replacements. There is a small left pleural effusion. There is compression atelectasis of  the bilateral posterior lung bases. There are patchy airspace consolidation the of the posterior right upper lobe suspicious for pneumonia.  IMPRESSION: Findings consistent with small bowel obstruction. In the right mid abdomen, there is a small bowel loop with the extensive bowel content and crecent air anteriorly; this is more likely due to air surrounding stool and less likely due to pneumatosis. Clinical correlation with lactose level is recommended.  Colonic bowel wall thickening of ascending, transverse, and descending colon. This is in part due to decompressed colon. However, colitis or ischemic colon are not excluded on this noncontrast exam.  Right upper lobe pneumonia.   Electronically Signed   By: Abelardo Diesel M.D.   On: 09/25/2013 17:56   Dg Chest 2 View  09/09/2013   CLINICAL DATA:  Leukocytosis.  Preop back surgery.  EXAM: CHEST  2 VIEW  COMPARISON:  07/21/2013  FINDINGS: The patient is status post median sternotomy and CABG procedure. There is mild to moderate cardiac enlargement. Chronic asymmetric elevation of the left hemidiaphragm is noted. There is a new left pleural effusion identified. Atelectasis is present in both lung bases. There is advanced osteoarthritis involving both glenohumeral joints.  IMPRESSION: 1. New left pleural effusion.   Electronically Signed   By: Kerby Moors M.D.   On: 09/09/2013 18:54   Ct Lumbar Spine Wo Contrast  09/09/2013   CLINICAL DATA:  Back pain.  Fall 6 weeks ago.  L1 fracture.  EXAM: CT LUMBAR SPINE WITHOUT CONTRAST  TECHNIQUE: Multidetector CT imaging of the lumbar spine was performed without intravenous contrast administration. Multiplanar CT image reconstructions were also generated.  COMPARISON:  09/08/2013  FINDINGS: The spinous processes, facets, an vertebral bodies are all fused in the  lumbar spine suggesting severe and advanced ankylosing spondylitis.  There is a transverse cleft in the L1 vertebral body extending longitudinally into the  pedicles. This cleft has fluid signal and tiny locules of gas signal, and the fracture appears to continue through the posterior elements in a nondisplaced manner. No subluxation. Irregular margins of the fracture somewhat intact interdigitate.  The T11-12 level does not appear completely fused although there is fusion at T11.  Severe bony demineralization.  The sacroiliac joints are completely fused.  Scattered exophytic lesions of both kidneys have varying complexity.  There is paraspinal hematoma in the vicinity of the fracture. I do not demonstrate significant bony retropulsion.  Aortoiliac atherosclerotic vascular disease is present.  There is considerable abnormal wall thickening and 3 long segment of the sigmoid colon, potentially from prominent inflammation or less likely tumor. Adjacent mesenteric edema noted.  Mild levoconvex lumbar scoliosis. Scattered small retroperitoneal lymph nodes are present.  IMPRESSION: 1. Acute nondisplaced transverse fracture through the vertebral body and posterior elements of L1 (Chance fracture), with associated paraspinal edema. 2. Markedly severe manifestations of ankylosing spondylitis with complete anterior and posterior fusion in the lumbar spine and complete fusion of the sacroiliac joints. 3. Severe wall thickening in the sigmoid colon with surrounding stranding, potentially from severe colitis or tumor. 4. Bilateral complex renal exophytic lesions, probably complex cysts although these are not characterized on today's exam with regard to malignant potential.   Electronically Signed   By: Sherryl Barters M.D.   On: 09/09/2013 17:44   Ir Fluoro Guide Cv Line Right  09/28/2013   CLINICAL DATA:  Non functional left arm AV dialysis fistula, not amenable to percutaneous intervention. Dialysis access needed.  EXAM: TUNNELED HEMODIALYSIS CATHETER PLACEMENT WITH ULTRASOUND AND FLUOROSCOPIC GUIDANCE  TECHNIQUE: The procedure, risks, benefits, and alternatives were explained  to the patient. Questions regarding the procedure were encouraged and answered. The patient understands and consents to the procedure. As antibiotic prophylaxis, cefazolin 1 g was ordered pre-procedure and administered intravenously within one hour of incision.Patency of the right IJ vein was confirmed with ultrasound with image documentation. An appropriate skin site was determined. Region was prepped using maximum barrier technique including cap and mask, sterile gown, sterile gloves, large sterile sheet, and Chlorhexidine as cutaneous antisepsis. The region was infiltrated locally with 1% lidocaine.  Intravenous Fentanyl and Versed were administered as conscious sedation during continuous cardiorespiratory monitoring by the radiology RN, with a total moderate sedation time of 80 minutes.  Under real-time ultrasound guidance, the right IJ vein was accessed with a 21 gauge micropuncture needle; the needle tip within the vein was confirmed with ultrasound image documentation. Needle exchanged over the 018 guidewire for transitional dilator, which allowed advancement of a Benson wire into the IVC. Over this, an MPA catheter was advanced. A Hemosplit 19 hemodialysis catheter was tunneled from the right anterior chest wall approach to the right IJ dermatotomy site. The MPA catheter was exchanged over an Amplatz wire for serial vascular dilators which allow placement of a peel-away sheath, through which the catheter was advanced under intermittent fluoroscopy, positioned with its tips in the proximal and midright atrium. Spot chest radiograph confirms good catheter position. No pneumothorax. Catheter was flushed and primed per protocol. Catheter secured externally with O Prolene sutures. The right IJ dermatotomy site was closed with 3-0 Monocryl subcutaneous suture and covered with Dermabond. No immediate complication.  COMPARISON:  None  FLUOROSCOPY TIME:  10 min 12 seconds  ACCESS: Remains approachable for  percutaneous  intervention as needed.  IMPRESSION: 1. Technically successful placement of tunneled right IJ hemodialysis catheter with ultrasound and fluoroscopic guidance. Ready for routine use.   Electronically Signed   By: Arne Cleveland M.D.   On: 09/28/2013 14:10   Ir Angio Av Shunt Addl Access  09/28/2013   CLINICAL DATA:  End stage renal disease, poor function of left arm dialysis fistula.  TECHNIQUE: DIALYSIS SHUNTOGRAM  The procedure, risks (including but not limited to bleeding, infection, organ damage ), benefits, and alternatives were explained to the patient. Questions regarding the procedure were encouraged and answered. The patient understands and consents to the procedure.  In preparation for possible declot procedure, the skin overlying the left forearm fistula was prepped with Betadine, draped in usual sterile fashion, infiltrated locally with 1% lidocaine.  Intravenous Fentanyl and Versed were administered as conscious sedation during continuous cardiorespiratory monitoring by the radiology RN, with a total moderate sedation time of eighty minutes.  The outflow vein from the fistula was accessed just central to the fistula with a 21 gauge micropuncture needle under real-time ultrasound guidance. Needle was exchanged over a 018 guide wire for a 4 French transitional dilator. This was used for outflow venography.  The outflow vein was then accessed more centrally retrograde with a 21 gauge micropuncture needle under real-time ultrasound guidance. The needle was exchanged over the 018 wire for the transitional dilator, which allow placement of a Bentson wire. Over this, a 5 [place] Kumpe catheter was advanced and used to negotiate the arterial anastomosis for additional fistulography. Attempts were made to catheterize the proximal radial artery using a variety of catheter & guidewire combinations but these were ultimately unsuccessful. Assessment of the arterial anatomy of the forearm was performed  with a blood pressure cuff at the upper arm level.  Ultimately, No declot procedure was needed. The access sites were removed and hemostasis achieved with manual compression.  No immediate complication.  COMPARISON:  None  FLUOROSCOPY TIME:  10 min 12 seconds  ACCESS: Proximal left radial artery occlusion. Not amenable to percutaneous intervention. Consider surgical consultation.  FINDINGS: The outflow cephalic vein from the fistula is widely patent. Central venous system through the SVC is patent. There is a competing outflow vein at the level of the midforearm. There is stenosis at the arterial anastomosis. The proximal radial artery is occluded, not opacified central to the level of the fistula. There is retrograde flow in the distal radial artery supplied by palmar arches, supplying low-grade flow across the fistula.  IMPRESSION: 1. Proximal left radial artery occlusion. Collateral flow via palmar arches reconstitutes the distal radial artery, with retrograde flow into the fistula, at an inadequate rate. This is not amenable to percutaneous intervention. After telephone consultation with Dr. Jonnie Finner, plan made to proceed with hemodialysis catheter placement.   Electronically Signed   By: Arne Cleveland M.D.   On: 09/28/2013 14:17   Ir US Guide Vasc Access Left  09/28/2013   CLINICAL DATA:  End stage renal disease, poor function of left arm dialysis fistula.  TECHNIQUE: DIALYSIS SHUNTOGRAM  The procedure, risks (including but not limited to bleeding, infection, organ damage ), benefits, and alternatives were explained to the patient. Questions regarding the procedure were encouraged and answered. The patient understands and consents to the procedure.  In preparation for possible declot procedure, the skin overlying the left forearm fistula was prepped with Betadine, draped in usual sterile fashion, infiltrated locally with 1% lidocaine.  Intravenous Fentanyl and Versed were administered as conscious  sedation  during continuous cardiorespiratory monitoring by the radiology RN, with a total moderate sedation time of eighty minutes.  The outflow vein from the fistula was accessed just central to the fistula with a 21 gauge micropuncture needle under real-time ultrasound guidance. Needle was exchanged over a 018 guide wire for a 4 French transitional dilator. This was used for outflow venography.  The outflow vein was then accessed more centrally retrograde with a 21 gauge micropuncture needle under real-time ultrasound guidance. The needle was exchanged over the 018 wire for the transitional dilator, which allow placement of a Bentson wire. Over this, a 5 [place] Kumpe catheter was advanced and used to negotiate the arterial anastomosis for additional fistulography. Attempts were made to catheterize the proximal radial artery using a variety of catheter & guidewire combinations but these were ultimately unsuccessful. Assessment of the arterial anatomy of the forearm was performed with a blood pressure cuff at the upper arm level.  Ultimately, No declot procedure was needed. The access sites were removed and hemostasis achieved with manual compression.  No immediate complication.  COMPARISON:  None  FLUOROSCOPY TIME:  10 min 12 seconds  ACCESS: Proximal left radial artery occlusion. Not amenable to percutaneous intervention. Consider surgical consultation.  FINDINGS: The outflow cephalic vein from the fistula is widely patent. Central venous system through the SVC is patent. There is a competing outflow vein at the level of the midforearm. There is stenosis at the arterial anastomosis. The proximal radial artery is occluded, not opacified central to the level of the fistula. There is retrograde flow in the distal radial artery supplied by palmar arches, supplying low-grade flow across the fistula.  IMPRESSION: 1. Proximal left radial artery occlusion. Collateral flow via palmar arches reconstitutes the distal radial artery,  with retrograde flow into the fistula, at an inadequate rate. This is not amenable to percutaneous intervention. After telephone consultation with Dr. Jonnie Finner, plan made to proceed with hemodialysis catheter placement.   Electronically Signed   By: Arne Cleveland M.D.   On: 09/28/2013 14:17   Ir US Guide Vasc Access Right  09/28/2013   CLINICAL DATA:  Non functional left arm AV dialysis fistula, not amenable to percutaneous intervention. Dialysis access needed.  EXAM: TUNNELED HEMODIALYSIS CATHETER PLACEMENT WITH ULTRASOUND AND FLUOROSCOPIC GUIDANCE  TECHNIQUE: The procedure, risks, benefits, and alternatives were explained to the patient. Questions regarding the procedure were encouraged and answered. The patient understands and consents to the procedure. As antibiotic prophylaxis, cefazolin 1 g was ordered pre-procedure and administered intravenously within one hour of incision.Patency of the right IJ vein was confirmed with ultrasound with image documentation. An appropriate skin site was determined. Region was prepped using maximum barrier technique including cap and mask, sterile gown, sterile gloves, large sterile sheet, and Chlorhexidine as cutaneous antisepsis. The region was infiltrated locally with 1% lidocaine.  Intravenous Fentanyl and Versed were administered as conscious sedation during continuous cardiorespiratory monitoring by the radiology RN, with a total moderate sedation time of 80 minutes.  Under real-time ultrasound guidance, the right IJ vein was accessed with a 21 gauge micropuncture needle; the needle tip within the vein was confirmed with ultrasound image documentation. Needle exchanged over the 018 guidewire for transitional dilator, which allowed advancement of a Benson wire into the IVC. Over this, an MPA catheter was advanced. A Hemosplit 19 hemodialysis catheter was tunneled from the right anterior chest wall approach to the right IJ dermatotomy site. The MPA catheter was exchanged  over an Amplatz wire for  serial vascular dilators which allow placement of a peel-away sheath, through which the catheter was advanced under intermittent fluoroscopy, positioned with its tips in the proximal and midright atrium. Spot chest radiograph confirms good catheter position. No pneumothorax. Catheter was flushed and primed per protocol. Catheter secured externally with O Prolene sutures. The right IJ dermatotomy site was closed with 3-0 Monocryl subcutaneous suture and covered with Dermabond. No immediate complication.  COMPARISON:  None  FLUOROSCOPY TIME:  10 min 12 seconds  ACCESS: Remains approachable for percutaneous intervention as needed.  IMPRESSION: 1. Technically successful placement of tunneled right IJ hemodialysis catheter with ultrasound and fluoroscopic guidance. Ready for routine use.   Electronically Signed   By: Arne Cleveland M.D.   On: 09/28/2013 14:10   Dg Chest Port 1 View  10/01/2013   CLINICAL DATA:  Atelectasis.  EXAM: PORTABLE CHEST - 1 VIEW  COMPARISON:  Chest radiograph 09/30/2013.  FINDINGS: Right-sided hemodialysis catheter unchanged. Left-sided central venous catheter tip projects over the superior cavoatrial junction. Interval extubation. NG tube tip and side-port project over the left upper quadrant. Stable cardiac and mediastinal contours. Small bilateral pleural effusions with underlying opacities likely representing atelectasis.  IMPRESSION: Marked gaseous distention of the visualized upper abdominal bowel loops, recommend correlation with dedicated abdominal radiography.  Interval extubation.  Otherwise stable support apparatus.  Small bilateral pleural effusions.  Underlying atelectasis.   Electronically Signed   By: Lovey Newcomer M.D.   On: 10/01/2013 08:08   Dg Chest Port 1 View  09/30/2013   CLINICAL DATA:  Central line placement  EXAM: PORTABLE CHEST - 1 VIEW  COMPARISON:  Air radiograph 09/29/2013  FINDINGS: There has been interval placement of a left IJ central  venous catheter with tip overlying the cavoatrial junction. Tip of the endotracheal tube is positioned 3.6 cm above the carina. Right-sided hemodialysis catheter is unchanged.  Sequelae of prior CABG again noted.  Lungs are hypoinflated. Bilateral pleural effusions, left greater than right, are slightly improved relative to prior study. Associated bibasilar atelectasis is present. No new focal infiltrate. No overt pulmonary edema. No pneumothorax.  Osseous structures are unchanged.  IMPRESSION: 1. Tip of the left IJ central venous catheter overlying the cavoatrial junction. Remaining support apparatus as above. 2. Slight interval decrease in size of bilateral pleural effusions, left greater than right, with similar bibasilar atelectasis.   Electronically Signed   By: Jeannine Boga M.D.   On: 09/30/2013 03:23   Dg Chest Port 1 View  09/29/2013   CLINICAL DATA:  Status post right partial colectomy  EXAM: PORTABLE CHEST - 1 VIEW  COMPARISON:  02/17/2013  FINDINGS: Cardiac shadow remains mildly enlarged. A dialysis catheter is seen in satisfactory position. An endotracheal tube is now noted 3.8 cm above the carina. A nasogastric catheter extends into the stomach. Bilateral pleural effusions left greater than right are seen. The left effusion appears chronic in nature when compare with the previous exam from multiple months previous. No new focal abnormality is seen.  IMPRESSION: Tubes and lines as described.  Pleural effusions left greater than right. The left effusion appears chronic in nature.   Electronically Signed   By: Inez Catalina M.D.   On: 09/29/2013 18:00   Dg Abd 2 Views  09/28/2013   CLINICAL DATA:  Small bowel obstruction. Abdominal distention. NG tube placement.  EXAM: ABDOMEN - 2 VIEW  COMPARISON:  09/27/2013.  FINDINGS: Supine and left lateral decubitus views of the abdomen again demonstrate diffuse dilatation of the small  bowel loops in the central abdomen measuring up to 4.3 cm in  diameter. Multiple air-fluid levels are noted on the left lateral decubitus view. No frank pneumoperitoneum. Paucity of distal colonic gas, although there is a small amount of rectal gas present. Nasogastric tube appears coiled in the stomach. 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  IMPRESSION: 1. Bowel gas pattern remains compatible with a small bowel obstruction. 2. No pneumoperitoneum at this time. 3. Nasogastric tube remains coiled in the stomach.   Electronically Signed   By: Vinnie Langton M.D.   On: 09/28/2013 10:38   Dg Abd 2 Views  09/27/2013   CLINICAL DATA:  Mid abdominal pain, back pain  EXAM: ABDOMEN - 2 VIEW  COMPARISON:  DG ABD PORTABLE 1V dated 09/26/2013  FINDINGS: Multiple dilated loops small bowel appreciated throughout the abdomen. Air-fluid levels identified. There is no evidence of free air. A moderate amount of stools appreciated region of the ascending colon. There is a paucity of distal bowel gas. The NG tube is seen with tip curled in the region of the stomach. When compared to previous study the dilated loops of small bowel have decreased in size and distribution.  IMPRESSION: Findings consistent with an improving small bowel obstruction. There is no evidence of free air.   Electronically Signed   By: Margaree Mackintosh M.D.   On: 09/27/2013 13:27   Dg Abd Portable 1v  10/02/2013   CLINICAL DATA:  Evaluate for resolution of ileus  EXAM: PORTABLE ABDOMEN - 1 VIEW  COMPARISON:  DG ABD PORTABLE 1V dated 10/01/2013; DG ABD PORTABLE 2V dated 09/29/2013; CT ABD/PELV WO CM dated 09/25/2013  FINDINGS: There is a paucity of bowel gas. There is persistent moderate gas distention of several mildly dilated loops of rather featureless appearing small bowel within the left mid hemi abdomen though there is decreased gaseous distention of these bowel loops. A minimal amount of air is seen within the ascending and descending colon.   Nondiagnostic evaluation pneumoperitoneum. An enteric tube and side port overlie the expected location of the mid body of the stomach.  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 Post bilateral total hip replacements, incompletely imaged.  IMPRESSION: 1. Findings most suggestive of improving ileus, though note, there is persistent mild gas distention of several featureless loops of small bowel within the mid abdomen. Clinical correlation is advised. 2. Sequela of ankylosing spondylitis   Electronically Signed   By: Sandi Mariscal M.D.   On: 10/02/2013 08:21   Dg Abd Portable 1v  10/01/2013   CLINICAL DATA:  Distension.  Recent hemicolectomy with colostomy.  EXAM: PORTABLE ABDOMEN - 1 VIEW  COMPARISON:  09/29/2013 and 09/28/2013  FINDINGS: Central venous catheter unchanged with tip over the right atrium. Nasogastric tube is present with tip over the midline upper abdomen likely over the distal stomach. There is continued evidence of multiple air-filled dilated small bowel loops. There is minimal air within the colon. There is continued evidence of an unusual air collection in the right upper quadrant as cannot exclude free peritoneal air as seen on the prior study. Remainder of the exam is unchanged.  IMPRESSION: Persistent air-filled dilated small bowel loops with paucity of colonic gas. Findings suggesting small bowel obstructive process. Persistent unusual air collection over the right upper quadrant as this may represent free peritoneal air as seen previously likely due to patient's recent hemicolectomy.  Nasogastric tube with tip over  the mid abdomen likely in the distal stomach.  Findings discussed with patient's nurse, Morey Hummingbird, at the time of dictation.   Electronically Signed   By: Marin Olp M.D.   On: 10/01/2013 14:56   Dg Abd Portable 1v  09/26/2013   CLINICAL DATA:  Small bowel obstruction, abdominal pain and distention  EXAM: PORTABLE ABDOMEN - 1 VIEW   COMPARISON:  Prior abdominal radiograph 09/25/2013  FINDINGS: Persistent gaseous distension of the stomach and multiple loops of small bowel. The tip of the nasogastric tube projects over the gastric bubble, however the proximal side hole is in the region of the gastroesophageal junction. The degree of small-bowel distention is not significantly changed since the prior study. Maximal small bowel diameter is again 5 cm. No large free air on this single supine radiograph. Surgical changes of prior median sternotomy and bilateral total hip arthroplasties. The bones are osteopenic. Atherosclerotic vascular calcifications noted in the aorta.  IMPRESSION: 1. No significant interval change in the appearance of high-grade small bowel obstruction. Maximal small bowel diameter is again 5 cm. 2. The proximal side port of the nasogastric tube is in the GE junction. Consider advancing 5 cm for more optimal placement.   Electronically Signed   By: Jacqulynn Cadet M.D.   On: 09/26/2013 07:35   Dg Abd Portable 1v  09/25/2013   CLINICAL DATA:  Nasogastric tube placement  EXAM: PORTABLE ABDOMEN - 1 VIEW  COMPARISON:  CT abdomen September 25, 2013  FINDINGS: Nasogastric tube is identified with distal tip in the mid stomach. The stomach is air-filled and distended. There are multiple dilated small bowel loops.  IMPRESSION: Naso gastric tube distal tip in the mid stomach. Small bowel obstruction.   Electronically Signed   By: Abelardo Diesel M.D.   On: 09/25/2013 22:56   Dg Abd Portable 2v  10/06/2013   CLINICAL DATA:  Severe abdominal distention and pain. Postop from right hemicolectomy on 09/29/2013.  EXAM: PORTABLE ABDOMEN - 2 VIEW  COMPARISON:  10/02/2013  FINDINGS: Increased dilatation of small bowel loops seen. A moderate amount of free intraperitoneal air is also demonstrated and appears increased since previous study. Previous seen nasogastric tube is no longer visualized.  IMPRESSION: Increased small bowel dilatation and  free intraperitoneal air since prior study, highly suspicious for anastomotic leak or perforated viscus.  Critical Value/emergent results were called by telephone at the time of interpretation on 10/06/2013 at 2:01 PM to the patient's floor nurse Jonni Sanger, who verbally acknowledged these results.   Electronically Signed   By: Earle Gell M.D.   On: 10/06/2013 14:04   Dg Abd Portable 2v  09/29/2013   CLINICAL DATA:  Status post colonoscopy. Question free intraperitoneal air.  EXAM: PORTABLE ABDOMEN - 2 VIEW  COMPARISON:  CT abdomen and pelvis 09/25/2013.  FINDINGS: There is a large volume of free intraperitoneal air. NG tube is in place with tip in the stomach.  IMPRESSION: Study is positive for free intraperitoneal air.  Critical Value/emergent results were called by telephone at the time of interpretation on 09/29/2013 at 12:27 PM to Dr. Ronald Lobo , who verbally acknowledged these results.   Electronically Signed   By: Inge Rise M.D.   On: 09/29/2013 12:30   Ir Shuntogram/ Fistulagram Left Mod Sed  09/28/2013   CLINICAL DATA:  End stage renal disease, poor function of left arm dialysis fistula.  TECHNIQUE: DIALYSIS SHUNTOGRAM  The procedure, risks (including but not limited to bleeding, infection, organ damage ), benefits, and alternatives  were explained to the patient. Questions regarding the procedure were encouraged and answered. The patient understands and consents to the procedure.  In preparation for possible declot procedure, the skin overlying the left forearm fistula was prepped with Betadine, draped in usual sterile fashion, infiltrated locally with 1% lidocaine.  Intravenous Fentanyl and Versed were administered as conscious sedation during continuous cardiorespiratory monitoring by the radiology RN, with a total moderate sedation time of eighty minutes.  The outflow vein from the fistula was accessed just central to the fistula with a 21 gauge micropuncture needle under real-time ultrasound  guidance. Needle was exchanged over a 018 guide wire for a 4 French transitional dilator. This was used for outflow venography.  The outflow vein was then accessed more centrally retrograde with a 21 gauge micropuncture needle under real-time ultrasound guidance. The needle was exchanged over the 018 wire for the transitional dilator, which allow placement of a Bentson wire. Over this, a 5 [place] Kumpe catheter was advanced and used to negotiate the arterial anastomosis for additional fistulography. Attempts were made to catheterize the proximal radial artery using a variety of catheter & guidewire combinations but these were ultimately unsuccessful. Assessment of the arterial anatomy of the forearm was performed with a blood pressure cuff at the upper arm level.  Ultimately, No declot procedure was needed. The access sites were removed and hemostasis achieved with manual compression.  No immediate complication.  COMPARISON:  None  FLUOROSCOPY TIME:  10 min 12 seconds  ACCESS: Proximal left radial artery occlusion. Not amenable to percutaneous intervention. Consider surgical consultation.  FINDINGS: The outflow cephalic vein from the fistula is widely patent. Central venous system through the SVC is patent. There is a competing outflow vein at the level of the midforearm. There is stenosis at the arterial anastomosis. The proximal radial artery is occluded, not opacified central to the level of the fistula. There is retrograde flow in the distal radial artery supplied by palmar arches, supplying low-grade flow across the fistula.  IMPRESSION: 1. Proximal left radial artery occlusion. Collateral flow via palmar arches reconstitutes the distal radial artery, with retrograde flow into the fistula, at an inadequate rate. This is not amenable to percutaneous intervention. After telephone consultation with Dr. Jonnie Finner, plan made to proceed with hemodialysis catheter placement.   Electronically Signed   By: Arne Cleveland  M.D.   On: 09/28/2013 14:17   Mr [employer] Spine  09/09/2013   This examination belongs to an outside facility and is stored  here for comparison purposes only.  Contact the originating outside  institution for any associated report or interpretation.        Subjective: Patient is resting peacefully as I entered the room. After awakening, the patient complained of abdominal pain, but states that it is improved with opioid administration. Complains of some nausea without any vomiting. Denies any chest pain or shortness of breath. No headache or fevers or chills.  Objective: Filed Vitals:   10/07/13 1015 10/07/13 1045 10/07/13 1100 10/07/13 1108  BP: 107/76 101/72 116/78 143/82  Pulse: 96 97 104 96  Temp:    97.5 F (36.4 C)  TempSrc:    Oral  Resp: 18 20 18 20   Height:      Weight:    69.5 kg (153 lb 3.5 oz)  SpO2:    95%    Intake/Output Summary (Last 24 hours) at 10/07/13 1414 Last data filed at 10/07/13 1108  Gross per 24 hour  Intake  240 ml  Output      0 ml  Net    240 ml   Weight change: 0.5 kg (1 lb 1.6 oz) Exam:   General:  Pt is alert, follows commands appropriately, not in acute distress  HEENT: No icterus, No thrush,[place]/AT  Cardiovascular: RRR, S1/S2, no rubs, no gallops  Respiratory: Basilar crackles without any wheezing. Good air movement.  Abdomen: Soft/+BS, diffuse tenderness, non distended, no guarding or rebound  Extremities: trace LE edema, No lymphangitis, No petechiae, No rashes, no synovitis  Data Reviewed: Basic Metabolic Panel:  Recent Labs Lab 10/01/13 0400 10/02/13 0357 10/03/13 0400 10/04/13 1030 10/05/13 0540 10/07/13 0658  NA 135* 140 137  --  141 138  K 4.2 3.1* 3.4* 3.9 3.8 4.2  CL 100 102 99  --  104 99  CO2 20 22 22   --  26 28  GLUCOSE 109* 93 94  --  106* 70  BUN 34* 14 21  --  16 14  CREATININE 3.95* 2.17* 2.92*  --  2.57* 2.44*  CALCIUM 6.9* 7.0* 7.3*  --  7.5* 7.6*  PHOS  --   --  4.0  --  3.2 3.3   Liver  Function Tests:  Recent Labs Lab 10/03/13 0400 10/05/13 0540 10/07/13 0658  ALBUMIN 1.5* 1.4* 1.5*   No results found for this basename: LIPASE, AMYLASE,  in the last 168 hours No results found for this basename: AMMONIA,  in the last 168 hours CBC:  Recent Labs Lab 10/02/13 0357 10/03/13 0400 10/05/13 0540 10/06/13 0525 10/07/13 0658  WBC 15.4* 15.8* 12.7* 14.3* 14.6*  HGB 8.1* 8.7* 9.1* 9.2* 9.6*  HCT 25.3* 27.5* 28.9* 29.4* 30.6*  MCV 94.4 95.2 96.0 96.7 97.1  PLT 212 265 329 363 411*   Cardiac Enzymes: No results found for this basename: CKTOTAL, CKMB, CKMBINDEX, TROPONINI,  in the last 168 hours BNP: No components found with this basename: POCBNP,  CBG:  Recent Labs Lab 10/05/13 1713 10/05/13 2148 10/06/13 0024 10/06/13 0756 10/07/13 1141  GLUCAP 77 63* 73 128* 97    Recent Results (from the past 240 hour(s))  CLOSTRIDIUM DIFFICILE BY PCR     Status: None   Collection Time    10/04/13 10:17 AM      Result Value Ref Range Status   C difficile by pcr NEGATIVE  NEGATIVE Final     Scheduled Meds: . acetaminophen  1,000 mg Oral TID  . amiodarone  200 mg Oral Daily  . antiseptic oral rinse  1 application Mouth Rinse QID  . calcitonin (salmon)  1 spray Alternating Nares Daily  . chlorhexidine  15 mL Mouth/Throat BID  . [START ON 10/10/2013] darbepoetin  150 mcg Intravenous Q Mon-HD  . doxercalciferol  2 mcg Intravenous Q M,W,F-HD  . feeding supplement (NEPRO CARB STEADY)  237 mL Oral Q24H  . feeding supplement (RESOURCE BREEZE)  1 Container Oral BID BM  . ferric gluconate (FERRLECIT/NULECIT) IV  62.5 mg Intravenous Q Fri-HD  . gabapentin  300 mg Oral Daily  . hydrALAZINE  10 mg Oral Once  . lidocaine  1 patch Transdermal Q24H  . methocarbamol  750 mg Oral QID  . midodrine  10 mg Oral BID WC  . multivitamin  1 tablet Oral QHS  . OxyCODONE  15 mg Oral Q12H  . pantoprazole  40 mg Oral Q1200  . psyllium  1 packet Oral BID  . traMADol  50 mg Oral Q12H    Continuous Infusions: .  sodium chloride Stopped (10/03/13 1300)     Collyn Selk, DO  [facility] Pager [phone]  If 7PM-7AM, please contact night-coverage [email or web address] Password [email or web address] 10/07/2013, 2:14 PM   LOS: 12 days

## 2013-10-07 NOTE — Progress Notes (Signed)
Patient ID: Nicholas [name], male   DOB: 1941-04-02, 73 y.o.   MRN: [id number] 8 Days Post-Op  Subjective: Pt feels better today.  Still with some pain but better.    Objective: Vital signs in last 24 hours: Temp:  [97.2 F (36.2 C)-97.6 F (36.4 C)] 97.6 F (36.4 C) (02/20 0654) Pulse Rate:  [78-104] 104 (02/20 1100) Resp:  [16-20] 18 (02/20 1100) BP: (84-141)/(63-83) 116/78 mmHg (02/20 1100) SpO2:  [94 %-98 %] 94 % (02/20 0654) Weight:  [153 lb (69.4 kg)] 153 lb (69.4 kg) (02/20 0654) Last BM Date: 10/05/13  Intake/Output from previous day: 02/19 0701 - 02/20 0700 In: 360 [P.O.:360] Out: -  Intake/Output this shift:    PE: Abd: soft, appropriately tender, ostomy with good output, +BS, midline wound clean and packed  Lab Results:   Recent Labs  10/06/13 0525 10/07/13 0658  WBC 14.3* 14.6*  HGB 9.2* 9.6*  HCT 29.4* 30.6*  PLT 363 411*   BMET  Recent Labs  10/05/13 0540 10/07/13 0658  NA 141 138  K 3.8 4.2  CL 104 99  CO2 26 28  GLUCOSE 106* 70  BUN 16 14  CREATININE 2.57* 2.44*  CALCIUM 7.5* 7.6*   PT/INR No results found for this basename: LABPROT, INR,  in the last 72 hours CMP     Component Value Date/Time   NA 138 10/07/2013 0658   K 4.2 10/07/2013 0658   CL 99 10/07/2013 0658   CO2 28 10/07/2013 0658   GLUCOSE 70 10/07/2013 0658   BUN 14 10/07/2013 0658   CREATININE 2.44* 10/07/2013 0658   CALCIUM 7.6* 10/07/2013 0658   PROT 5.7* 09/25/2013 1621   ALBUMIN 1.5* 10/07/2013 0658   AST 11 09/25/2013 1621   ALT 6 09/25/2013 1621   ALKPHOS 138* 09/25/2013 1621   BILITOT 0.5 09/25/2013 1621   GFRNONAA 25* 10/07/2013 0658   GFRAA 29* 10/07/2013 0658   Lipase     Component Value Date/Time   LIPASE 29 10/22/2012 0900       Studies/Results: Ct Abdomen Pelvis Wo Contrast  10/06/2013   CLINICAL DATA:  Status post colectomy for colon cancer.  EXAM: CT ABDOMEN AND PELVIS WITHOUT CONTRAST  TECHNIQUE: Multidetector CT imaging of the abdomen and pelvis was performed  following the standard protocol without intravenous contrast.  COMPARISON:  CT scan of September 25, 2013.  FINDINGS: L1 compression fracture is again noted. Moderate left pleural effusion is noted with left lower lobe atelectasis. Status post bilateral hip arthroplasties. Mild right basilar opacity is noted concerning for pneumonia or atelectasis with associated pleural effusion.  No focal abnormality is seen in the liver, spleen or pancreas on these unenhanced images. Dense sludge is noted in the gallbladder. Fluid is noted around the liver and spleen, as well as in the pelvis consistent with ascites. Colostomy is noted in the right lower quadrant and patient is status post right colectomy. Mild pneumoperitoneum is noted in the epigastric region. Midline surgical incision is noted. Small bowel dilatation is noted which may represent postoperative ileus. Bilateral renal atrophy is noted. Stable rounded and exophytic hyperdense and hypodense abnormalities are seen involving both kidneys most likely representing cysts. Renal ultrasound may be performed for further evaluation. Evaluation of the pelvis is limited due to scatter artifact from previously described bilateral hip arthroplasties. Atherosclerotic calcifications of abdominal aorta are noted without aneurysm formation. Adrenal glands appear normal.  IMPRESSION: L1 compression fracture is again noted.  Moderate left pleural effusion is noted  with left lower lobe atelectasis. Mild right basilar opacity is noted concerning for pneumonia or atelectasis.  Mild ascites is noted.  Status post right hemicolectomy with colostomy seen in right lower quadrant. Mildly dilated small bowel loops are noted most consistent with postoperative ileus, but distal obstruction cannot be excluded.  Stable rounded and exophytic hyperdense and hypodense abnormalities are seen involving both kidneys which may represent cysts; renal ultrasound is recommended for further evaluation.   Bilateral renal atrophy is noted.  Mild pneumoperitoneum is noted in the epigastric region most likely postoperative in nature.   Electronically Signed   By: Sabino Dick M.D.   On: 10/06/2013 21:16   Dg Abd Portable 2v  10/06/2013   CLINICAL DATA:  Severe abdominal distention and pain. Postop from right hemicolectomy on 09/29/2013.  EXAM: PORTABLE ABDOMEN - 2 VIEW  COMPARISON:  10/02/2013  FINDINGS: Increased dilatation of small bowel loops seen. A moderate amount of free intraperitoneal air is also demonstrated and appears increased since previous study. Previous seen nasogastric tube is no longer visualized.  IMPRESSION: Increased small bowel dilatation and free intraperitoneal air since prior study, highly suspicious for anastomotic leak or perforated viscus.  Critical Value/emergent results were called by telephone at the time of interpretation on 10/06/2013 at 2:01 PM to the patient's floor nurse Jonni Sanger, who verbally acknowledged these results.   Electronically Signed   By: Earle Gell M.D.   On: 10/06/2013 14:04    Anti-infectives: Anti-infectives   Start     Dose/Rate Route Frequency Ordered Stop   09/30/13 1400  ertapenem (INVANZ) 0.5 g in sodium chloride 0.9 % 50 mL IVPB     500 mg 100 mL/hr over 30 Minutes Intravenous Every 24 hours 09/29/13 2202 10/07/13 1359   09/29/13 1415  ertapenem (INVANZ) 1 g in sodium chloride 0.9 % 50 mL IVPB     1 g 100 mL/hr over 30 Minutes Intravenous  Once 09/29/13 1159 09/29/13 1412   09/29/13 0600  cefUROXime (ZINACEF) 1.5 g in dextrose 5 % 50 mL IVPB  Status:  Discontinued     1.5 g 100 mL/hr over 30 Minutes Intravenous On call to O.R. 09/28/13 1711 09/29/13 2203   09/28/13 1200  ceFAZolin (ANCEF) IVPB 2 g/50 mL premix    Comments:  Tube to 17   2 g 100 mL/hr over 30 Minutes Intravenous  Once 09/28/13 1155 09/28/13 1246   09/27/13 1200  vancomycin (VANCOCIN) IVPB 1000 mg/200 mL premix  Status:  Discontinued     1,000 mg 200 mL/hr over 60 Minutes  Intravenous Every M-W-F (Hemodialysis) 09/26/13 1638 09/26/13 1721   09/26/13 1745  metroNIDAZOLE (FLAGYL) IVPB 500 mg  Status:  Discontinued     500 mg 100 mL/hr over 60 Minutes Intravenous Every 8 hours 09/26/13 1722 10/03/13 1112   09/26/13 1200  vancomycin (VANCOCIN) IVPB 750 mg/150 ml premix  Status:  Discontinued     750 mg 150 mL/hr over 60 Minutes Intravenous Every M-W-F (Hemodialysis) 09/25/13 2344 09/26/13 1051   09/26/13 1200  vancomycin (VANCOCIN) IVPB 1000 mg/200 mL premix  Status:  Discontinued     1,000 mg 200 mL/hr over 60 Minutes Intravenous Every M-W-F (Hemodialysis) 09/26/13 1052 09/26/13 1638   09/26/13 0600  piperacillin-tazobactam (ZOSYN) IVPB 2.25 g  Status:  Discontinued     2.25 g 100 mL/hr over 30 Minutes Intravenous 3 times per day 09/25/13 2126 09/26/13 1721   09/26/13 0000  vancomycin (VANCOCIN) 500 mg in sodium chloride irrigation 0.9 % 100  mL ENEMA  Status:  Discontinued     500 mg Rectal 4 times per day 09/25/13 2337 09/26/13 1722   09/25/13 2130  vancomycin (VANCOCIN) IVPB 750 mg/150 ml premix     750 mg 150 mL/hr over 60 Minutes Intravenous  Once 09/25/13 2124 09/26/13 0145   09/25/13 2000  piperacillin-tazobactam (ZOSYN) IVPB 3.375 g     3.375 g 100 mL/hr over 30 Minutes Intravenous  Once 09/25/13 1853 09/25/13 2002   09/25/13 2000  vancomycin (VANCOCIN) IVPB 1000 mg/200 mL premix     1,000 mg 200 mL/hr over 60 Minutes Intravenous  Once 09/25/13 1859 09/25/13 2113       Assessment/Plan  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 Grandville Silos 09/29/13  Severe colitis  Mucosal hemorrhage/ulceration  -colonoscopy biopsy report shows adenocarcinoma. 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 Discussed results of pathology report and the patient is acting appropriately   -tolerating diet, but poor oral intake. Consult to dietician  -dc Invanz today -monitor stoma  -BID wet to dry dressing changes  -Tramadol BID, add robaxin, scheduled tylenol for better pain control  -Oncology Dr. Hans Eden is arranging outpatient follow up  -resume diet    LOS: 12 days    Keturah Yerby E 10/07/2013, 11:03 AM Pager: [phone]

## 2013-10-08 DIAGNOSIS — M545 Low back pain, unspecified: Secondary | ICD-10-CM

## 2013-10-08 LAB — CBC
HEMATOCRIT: 31.6 % — AB (ref 39.0–52.0)
Hemoglobin: 9.7 g/dL — ABNORMAL LOW (ref 13.0–17.0)
MCH: 30 pg (ref 26.0–34.0)
MCHC: 30.7 g/dL (ref 30.0–36.0)
MCV: 97.8 fL (ref 78.0–100.0)
Platelets: 360 10*3/uL (ref 150–400)
RBC: 3.23 MIL/uL — ABNORMAL LOW (ref 4.22–5.81)
RDW: 23 % — AB (ref 11.5–15.5)
WBC: 11.6 10*3/uL — ABNORMAL HIGH (ref 4.0–10.5)

## 2013-10-08 LAB — GLUCOSE, CAPILLARY
Glucose-Capillary: 73 mg/dL (ref 70–99)
Glucose-Capillary: 93 mg/dL (ref 70–99)

## 2013-10-08 NOTE — Progress Notes (Signed)
Physical Therapy Treatment Patient Details Name: Nicholas [name] MRN: [id number] DOB: 12/30/1940 Today's Date: 10/08/2013 Time: 1040-1105 PT Time Calculation (min): 25 min  PT Assessment / Plan / Recommendation  History of Present Illness 73 year old male with history of end-stage renal disease on dialysis (M., W., F.) ,CAD with ischemic cardiomyopathy and CABG in 6/14, EF of 50% as per last echo, diabetes mellitus, hypertension, A. fib, basilar neuropathy, depression, osteoporosis, COPD, who was admittedwith abdominal pain, N & V.  Patient here recently  for L1 compression fracture. He was managed nonsurgically  by neurosurgery while in the hospital and was discharged to a skilled nursing facility. He was also diagnosed to have C. difficile and was started on oral vancomycin. Patient now s/p right colectomy and repair of descending and sigmoid serosal tears and ileostomy placement.    PT Comments   Limited session due to abdominal wound drainage with saturated bandage.  Pt very fearful of falling per pt and limited due to abdominal pain.   Follow Up Recommendations  SNF     Equipment Recommendations  None recommended by PT    Frequency Min 3X/week   Progress towards PT Goals Progress towards PT goals: Not progressing toward goals - comment (due to limitation from abdominal bandage)  Plan Current plan remains appropriate    Precautions / Restrictions Precautions Precautions: Back;Fall Restrictions Weight Bearing Restrictions: No LLE Weight Bearing: Weight bearing as tolerated   Pertinent Vitals/Pain Pt reports back pain with movement but does not rate.      Mobility  Bed Mobility Overal bed mobility: Needs Assistance Rolling: Total assist;+2 for physical assistance General bed mobility comments: Pt needed total assistance to complete safe rolling to left and right as well as HOB to change pt's bed linens.  VCs for hand placement and proper technique.     Exercises     PT  Diagnosis:    PT Problem List:   PT Treatment Interventions:     PT Goals (current goals can now be found in the care plan section) Acute Rehab PT Goals Patient Stated Goal: Pt wants his pain to go away  I need more strength PT Goal Formulation: With patient Time For Goal Achievement: 10/17/13 Potential to Achieve Goals: Fair  Visit Information  Last PT Received On: 10/08/13 Assistance Needed: +2 History of Present Illness: 73 year old male with history of end-stage renal disease on dialysis (M., W., F.) ,CAD with ischemic cardiomyopathy and CABG in 6/14, EF of 50% as per last echo, diabetes mellitus, hypertension, A. fib, basilar neuropathy, depression, osteoporosis, COPD, who was admittedwith abdominal pain, N & V.  Patient here recently  for L1 compression fracture. He was managed nonsurgically  by neurosurgery while in the hospital and was discharged to a skilled nursing facility. He was also diagnosed to have C. difficile and was started on oral vancomycin. Patient now s/p right colectomy and repair of descending and sigmoid serosal tears and ileostomy placement.     Subjective Data  Subjective: "I don't think you'll be able to help me get up.  It's more mental than anything for me to walk.  I'm so scared I'm going to fall." Patient Stated Goal: Pt wants his pain to go away  I need more strength   Cognition  Cognition Arousal/Alertness: Awake/alert Behavior During Therapy: Anxious General Comments: Pt admits fearful of falling and becoming axious when moving.     Balance     End of Session PT - End of Session Activity Tolerance: Other (  comment) (Limited due to abdominal bandage saturated and RN called. ) Patient left: in bed;with call bell/phone within reach Nurse Communication: Need for lift equipment   GP     Caylor Tallarico 10/08/2013, 1:23 PM  Antoine Poche, [place] DPT [phone]

## 2013-10-08 NOTE — Progress Notes (Signed)
Assessment/Plan:  1. Bowel obstruction - s/p R colectomy, repair of serosal tears, & ileostomy 2/12. Cecal mass positive for invasive adenocarcinoma, mod differentiated, benign for inflamed proximal and transverse segments per pathology report. Mgmt per surgery. On Invanz. Continue No heparin HD.  2. ESRD - MWF 3    HTN/Volume -  4    Failed LUE AV access  Subjective: Interval History: Some soilage from ileostomy bag this am  Objective: Vital signs in last 24 hours: Temp:  [97 F (36.1 C)-98.7 F (37.1 C)] 97 F (36.1 C) (02/21 0940) Pulse Rate:  [83-92] 86 (02/21 0940) Resp:  [16-20] 18 (02/21 0940) BP: (100-135)/(67-85) 109/68 mmHg (02/21 0940) SpO2:  [91 %-97 %] 97 % (02/21 0940) Weight change: 0.1 kg (3.5 oz)  Intake/Output from previous day: 02/20 0701 - 02/21 0700 In: 285 [P.O.:180; IV Piggyback:105] Out: 295 [Stool:295] Intake/Output this shift: Total I/O In: 360 [P.O.:360] Out: -   General appearance: alert and cooperative Chest wall: no tenderness Cardio: regular rate and rhythm, S1, S2 normal, no murmur, click, rub or gallop GI: midline bandage, ileostomy RLQ with liquod Extremities:Failed AV access LUE  Lab Results:  Recent Labs  10/07/13 0658 10/08/13 0405  WBC 14.6* 11.6*  HGB 9.6* 9.7*  HCT 30.6* 31.6*  PLT 411* 360   BMET:  Recent Labs  10/07/13 0658  NA 138  K 4.2  CL 99  CO2 28  GLUCOSE 70  BUN 14  CREATININE 2.44*  CALCIUM 7.6*   No results found for this basename: PTH,  in the last 72 hours Iron Studies: No results found for this basename: IRON, TIBC, TRANSFERRIN, FERRITIN,  in the last 72 hours Studies/Results: Ct Abdomen Pelvis Wo Contrast  10/06/2013   CLINICAL DATA:  Status post colectomy for colon cancer.  EXAM: CT ABDOMEN AND PELVIS WITHOUT CONTRAST  TECHNIQUE: Multidetector CT imaging of the abdomen and pelvis was performed following the standard protocol without intravenous contrast.  COMPARISON:  CT scan of September 25, 2013.   FINDINGS: L1 compression fracture is again noted. Moderate left pleural effusion is noted with left lower lobe atelectasis. Status post bilateral hip arthroplasties. Mild right basilar opacity is noted concerning for pneumonia or atelectasis with associated pleural effusion.  No focal abnormality is seen in the liver, spleen or pancreas on these unenhanced images. Dense sludge is noted in the gallbladder. Fluid is noted around the liver and spleen, as well as in the pelvis consistent with ascites. Colostomy is noted in the right lower quadrant and patient is status post right colectomy. Mild pneumoperitoneum is noted in the epigastric region. Midline surgical incision is noted. Small bowel dilatation is noted which may represent postoperative ileus. Bilateral renal atrophy is noted. Stable rounded and exophytic hyperdense and hypodense abnormalities are seen involving both kidneys most likely representing cysts. Renal ultrasound may be performed for further evaluation. Evaluation of the pelvis is limited due to scatter artifact from previously described bilateral hip arthroplasties. Atherosclerotic calcifications of abdominal aorta are noted without aneurysm formation. Adrenal glands appear normal.  IMPRESSION: L1 compression fracture is again noted.  Moderate left pleural effusion is noted with left lower lobe atelectasis. Mild right basilar opacity is noted concerning for pneumonia or atelectasis.  Mild ascites is noted.  Status post right hemicolectomy with colostomy seen in right lower quadrant. Mildly dilated small bowel loops are noted most consistent with postoperative ileus, but distal obstruction cannot be excluded.  Stable rounded and exophytic hyperdense and hypodense abnormalities are seen involving both  kidneys which may represent cysts; renal ultrasound is recommended for further evaluation.  Bilateral renal atrophy is noted.  Mild pneumoperitoneum is noted in the epigastric region most likely  postoperative in nature.   Electronically Signed   By: Sabino Dick M.D.   On: 10/06/2013 21:16   Dg Abd Portable 2v  10/06/2013   CLINICAL DATA:  Severe abdominal distention and pain. Postop from right hemicolectomy on 09/29/2013.  EXAM: PORTABLE ABDOMEN - 2 VIEW  COMPARISON:  10/02/2013  FINDINGS: Increased dilatation of small bowel loops seen. A moderate amount of free intraperitoneal air is also demonstrated and appears increased since previous study. Previous seen nasogastric tube is no longer visualized.  IMPRESSION: Increased small bowel dilatation and free intraperitoneal air since prior study, highly suspicious for anastomotic leak or perforated viscus.  Critical Value/emergent results were called by telephone at the time of interpretation on 10/06/2013 at 2:01 PM to the patient's floor nurse Jonni Sanger, who verbally acknowledged these results.   Electronically Signed   By: Earle Gell M.D.   On: 10/06/2013 14:04   Scheduled: . acetaminophen  1,000 mg Oral TID  . amiodarone  200 mg Oral Daily  . antiseptic oral rinse  1 application Mouth Rinse QID  . calcitonin (salmon)  1 spray Alternating Nares Daily  . chlorhexidine  15 mL Mouth/Throat BID  . [START ON 10/10/2013] darbepoetin  150 mcg Intravenous Q Mon-HD  . doxercalciferol  2 mcg Intravenous Q M,W,F-HD  . feeding supplement (NEPRO CARB STEADY)  237 mL Oral Q24H  . feeding supplement (RESOURCE BREEZE)  1 Container Oral BID BM  . ferric gluconate (FERRLECIT/NULECIT) IV  62.5 mg Intravenous Q Fri-HD  . gabapentin  300 mg Oral Daily  . hydrALAZINE  10 mg Oral Once  . lidocaine  1 patch Transdermal Q24H  . methocarbamol  750 mg Oral QID  . midodrine  10 mg Oral BID WC  . multivitamin  1 tablet Oral QHS  . OxyCODONE  15 mg Oral Q12H  . pantoprazole  40 mg Oral Q1200  . psyllium  1 packet Oral BID  . traMADol  50 mg Oral Q12H     LOS: 13 days   Nicholas [name] C 10/08/2013,12:28 PM

## 2013-10-08 NOTE — Progress Notes (Signed)
9 Days Post-Op  Subjective: Pt saturated bed with serous fluid from open incision.  No other complaints. Called by nurse.  On floor rounding at the time on other patients..  Objective: Vital signs in last 24 hours: Temp:  [97 F (36.1 C)-98.7 F (37.1 C)] 97 F (36.1 C) (02/21 0940) Pulse Rate:  [83-92] 86 (02/21 0940) Resp:  [16-20] 18 (02/21 0940) BP: (100-135)/(67-85) 109/68 mmHg (02/21 0940) SpO2:  [91 %-97 %] 97 % (02/21 0940) Last BM Date: 10/05/13  Intake/Output from previous day: 02/20 0701 - 02/21 0700 In: 285 [P.O.:180; IV Piggyback:105] Out: 295 [Stool:295] Intake/Output this shift: Total I/O In: 360 [P.O.:360] Out: -   Incision/Wound:wound open and fascia intact. Fascia necrotic at places.  No other drainage noted.  Fairly clean min tender.  No stool or pus.  Ostomy pink and viable some edema.   Lab Results:   Recent Labs  10/07/13 0658 10/08/13 0405  WBC 14.6* 11.6*  HGB 9.6* 9.7*  HCT 30.6* 31.6*  PLT 411* 360   BMET  Recent Labs  10/07/13 0658  NA 138  K 4.2  CL 99  CO2 28  GLUCOSE 70  BUN 14  CREATININE 2.44*  CALCIUM 7.6*   PT/INR No results found for this basename: LABPROT, INR,  in the last 72 hours ABG No results found for this basename: PHART, PCO2, PO2, HCO3,  in the last 72 hours  Studies/Results: Ct Abdomen Pelvis Wo Contrast  10/06/2013   CLINICAL DATA:  Status post colectomy for colon cancer.  EXAM: CT ABDOMEN AND PELVIS WITHOUT CONTRAST  TECHNIQUE: Multidetector CT imaging of the abdomen and pelvis was performed following the standard protocol without intravenous contrast.  COMPARISON:  CT scan of September 25, 2013.  FINDINGS: L1 compression fracture is again noted. Moderate left pleural effusion is noted with left lower lobe atelectasis. Status post bilateral hip arthroplasties. Mild right basilar opacity is noted concerning for pneumonia or atelectasis with associated pleural effusion.  No focal abnormality is seen in the liver,  spleen or pancreas on these unenhanced images. Dense sludge is noted in the gallbladder. Fluid is noted around the liver and spleen, as well as in the pelvis consistent with ascites. Colostomy is noted in the right lower quadrant and patient is status post right colectomy. Mild pneumoperitoneum is noted in the epigastric region. Midline surgical incision is noted. Small bowel dilatation is noted which may represent postoperative ileus. Bilateral renal atrophy is noted. Stable rounded and exophytic hyperdense and hypodense abnormalities are seen involving both kidneys most likely representing cysts. Renal ultrasound may be performed for further evaluation. Evaluation of the pelvis is limited due to scatter artifact from previously described bilateral hip arthroplasties. Atherosclerotic calcifications of abdominal aorta are noted without aneurysm formation. Adrenal glands appear normal.  IMPRESSION: L1 compression fracture is again noted.  Moderate left pleural effusion is noted with left lower lobe atelectasis. Mild right basilar opacity is noted concerning for pneumonia or atelectasis.  Mild ascites is noted.  Status post right hemicolectomy with colostomy seen in right lower quadrant. Mildly dilated small bowel loops are noted most consistent with postoperative ileus, but distal obstruction cannot be excluded.  Stable rounded and exophytic hyperdense and hypodense abnormalities are seen involving both kidneys which may represent cysts; renal ultrasound is recommended for further evaluation.  Bilateral renal atrophy is noted.  Mild pneumoperitoneum is noted in the epigastric region most likely postoperative in nature.   Electronically Signed   By: Dionne Ano.D.  On: 10/06/2013 21:16   Dg Abd Portable 2v  10/06/2013   CLINICAL DATA:  Severe abdominal distention and pain. Postop from right hemicolectomy on 09/29/2013.  EXAM: PORTABLE ABDOMEN - 2 VIEW  COMPARISON:  10/02/2013  FINDINGS: Increased dilatation of  small bowel loops seen. A moderate amount of free intraperitoneal air is also demonstrated and appears increased since previous study. Previous seen nasogastric tube is no longer visualized.  IMPRESSION: Increased small bowel dilatation and free intraperitoneal air since prior study, highly suspicious for anastomotic leak or perforated viscus.  Critical Value/emergent results were called by telephone at the time of interpretation on 10/06/2013 at 2:01 PM to the patient's floor nurse Jonni Sanger, who verbally acknowledged these results.   Electronically Signed   By: Earle Gell M.D.   On: 10/06/2013 14:04    Anti-infectives: Anti-infectives   Start     Dose/Rate Route Frequency Ordered Stop   09/30/13 1400  ertapenem (INVANZ) 0.5 g in sodium chloride 0.9 % 50 mL IVPB  Status:  Discontinued     500 mg 100 mL/hr over 30 Minutes Intravenous Every 24 hours 09/29/13 2202 10/07/13 1106   09/29/13 1415  ertapenem (INVANZ) 1 g in sodium chloride 0.9 % 50 mL IVPB     1 g 100 mL/hr over 30 Minutes Intravenous  Once 09/29/13 1159 09/29/13 1412   09/29/13 0600  cefUROXime (ZINACEF) 1.5 g in dextrose 5 % 50 mL IVPB  Status:  Discontinued     1.5 g 100 mL/hr over 30 Minutes Intravenous On call to O.R. 09/28/13 1711 09/29/13 2203   09/28/13 1200  ceFAZolin (ANCEF) IVPB 2 g/50 mL premix    Comments:  Tube to 17   2 g 100 mL/hr over 30 Minutes Intravenous  Once 09/28/13 1155 09/28/13 1246   09/27/13 1200  vancomycin (VANCOCIN) IVPB 1000 mg/200 mL premix  Status:  Discontinued     1,000 mg 200 mL/hr over 60 Minutes Intravenous Every M-W-F (Hemodialysis) 09/26/13 1638 09/26/13 1721   09/26/13 1745  metroNIDAZOLE (FLAGYL) IVPB 500 mg  Status:  Discontinued     500 mg 100 mL/hr over 60 Minutes Intravenous Every 8 hours 09/26/13 1722 10/03/13 1112   09/26/13 1200  vancomycin (VANCOCIN) IVPB 750 mg/150 ml premix  Status:  Discontinued     750 mg 150 mL/hr over 60 Minutes Intravenous Every M-W-F (Hemodialysis) 09/25/13  2344 09/26/13 1051   09/26/13 1200  vancomycin (VANCOCIN) IVPB 1000 mg/200 mL premix  Status:  Discontinued     1,000 mg 200 mL/hr over 60 Minutes Intravenous Every M-W-F (Hemodialysis) 09/26/13 1052 09/26/13 1638   09/26/13 0600  piperacillin-tazobactam (ZOSYN) IVPB 2.25 g  Status:  Discontinued     2.25 g 100 mL/hr over 30 Minutes Intravenous 3 times per day 09/25/13 2126 09/26/13 1721   09/26/13 0000  vancomycin (VANCOCIN) 500 mg in sodium chloride irrigation 0.9 % 100 mL ENEMA  Status:  Discontinued     500 mg Rectal 4 times per day 09/25/13 2337 09/26/13 1722   09/25/13 2130  vancomycin (VANCOCIN) IVPB 750 mg/150 ml premix     750 mg 150 mL/hr over 60 Minutes Intravenous  Once 09/25/13 2124 09/26/13 0145   09/25/13 2000  piperacillin-tazobactam (ZOSYN) IVPB 3.375 g     3.375 g 100 mL/hr over 30 Minutes Intravenous  Once 09/25/13 1853 09/25/13 2002   09/25/13 2000  vancomycin (VANCOCIN) IVPB 1000 mg/200 mL premix     1,000 mg 200 mL/hr over 60 Minutes Intravenous  Once  09/25/13 1859 09/25/13 2113      Assessment/Plan: s/p Procedure(s): PARTIAL COLECTOMY (Right) COLOSTOMY (N/A) Dry to dry dressing for now.   Watch for separation of fascia. Continue to encourage po intake. Expect occasional serosanguinous drainage.   LOS: 13 days    Katerine Morua A. 10/08/2013

## 2013-10-08 NOTE — Progress Notes (Signed)
Changed patient's soaked abdominal incision at 09:05.  At 10:00 patient stated that he had urinated and it got on his gown.  Bed linens & dressing were dripping wet.  Dressing changed, for presumed urine saturation.  Dressing changed again at 11:09.  Wound actively draining serosanguinous fluid.  MD notified.  MD returned my call & told me to page the surgeon.  CCS on call paged at 11:20.  Dr. Brantley Stage arrived at 11:30.  Orders received to have dressings changed dry-to-dry.  Jillyn Ledger, MBA, BS, RN

## 2013-10-08 NOTE — Progress Notes (Signed)
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  Right colon perforation  -Repeat abdominal x-ray 10/06/13 showed increased free air  -10/06/2013 CT abdomen and pelvis shows moderate left effusion with atelectasis with right basilar opacity  -appreciate surgery followup  -Restart soft diet per surgery  -mobilize  -Patient finished 7 days of ertapenem 10/06/13  Right basilar opacity  -Doubt pneumonia  -No fevers or respiratory distress or shortness of breath  -Patient finished 7 days of ertapenem 10/06/13  L1 vertebral fx/Ankylosing spondylitis/uncontrolled pain  - sec to fall, managed non-operatively  -although patient has identifiable source of pain, the patient was soundly sleeping when I visited him x 2  -Seems to have some improvement with Robaxin  -Added long acting opioid--> OxyContin 15 mg every 12 hours-->pain improving  Adenocarcinoma of colon  -spoke with [facility]--Dr. 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  -f/u CBC-->Hgb stable -transfuse for Hb < 7  -SCD for DVT prevention  Family Communication: Pt at beside  Disposition Plan:  SNF on 10/10/13 if stable           Procedures/Studies: Ct Abdomen Pelvis Wo Contrast  10/06/2013   CLINICAL DATA:  Status post colectomy for colon cancer.  EXAM: CT ABDOMEN AND PELVIS WITHOUT CONTRAST  TECHNIQUE: Multidetector CT imaging of the abdomen and pelvis was performed following the standard protocol without intravenous contrast.  COMPARISON:  CT scan of September 25, 2013.  FINDINGS: L1 compression fracture is again noted. Moderate left pleural effusion is noted with left lower lobe atelectasis. Status post bilateral hip arthroplasties. Mild right basilar opacity is noted concerning for pneumonia or atelectasis with associated pleural effusion.  No focal abnormality is seen in the liver, spleen or pancreas on these unenhanced images. Dense sludge is noted in the gallbladder. Fluid is noted around the liver and spleen, as well as in the pelvis consistent with ascites. Colostomy is noted in the right lower quadrant and patient is status post right colectomy. Mild pneumoperitoneum is noted in the epigastric region. Midline surgical incision is noted. Small bowel dilatation is noted which may represent postoperative ileus. Bilateral renal atrophy is noted. Stable rounded and exophytic hyperdense and hypodense abnormalities are seen involving both kidneys most likely representing cysts. Renal ultrasound may be performed for further evaluation. Evaluation of the pelvis is limited due to scatter artifact from previously described bilateral hip arthroplasties. Atherosclerotic calcifications of abdominal aorta are noted without aneurysm formation. Adrenal glands appear  normal.  IMPRESSION: L1 compression fracture is again noted.  Moderate left pleural effusion is noted  with left lower lobe atelectasis. Mild right basilar opacity is noted concerning for pneumonia or atelectasis.  Mild ascites is noted.  Status post right hemicolectomy with colostomy seen in right lower quadrant. Mildly dilated small bowel loops are noted most consistent with postoperative ileus, but distal obstruction cannot be excluded.  Stable rounded and exophytic hyperdense and hypodense abnormalities are seen involving both kidneys which may represent cysts; renal ultrasound is recommended for further evaluation.  Bilateral renal atrophy is noted.  Mild pneumoperitoneum is noted in the epigastric region most likely postoperative in nature.   Electronically Signed   By: Sabino Dick M.D.   On: 10/06/2013 21:16   Ct Abdomen Pelvis Wo Contrast  09/25/2013   CLINICAL DATA:  Vomiting, abdominal distention, constipation  EXAM: CT ABDOMEN AND PELVIS WITHOUT CONTRAST  TECHNIQUE: Multidetector CT imaging of the abdomen and pelvis was performed following the standard protocol without intravenous contrast.  COMPARISON:  None.  FINDINGS: There are multiple dilated small bowel loops throughout the abdomen and pelvis. In the right mid abdomen, there is a small bowel loop with the extensive bowel content and crecent air anteriorly; this is more likely due to air surrounding stool and less likely due to pneumatosis. There is bowel wall thickening in decompressed ascending, transverse and descending colon.  The liver, spleen, pancreas are normal. The gallbladder is distended. The adrenal glands are normal. The kidneys are atrophic. There are nonobstructing left kidney stones. There are small high and low density lesions in both kidneys, evaluation is limited without contrast. There is atherosclerosis of the abdominal aorta without aneurysmal dilatation. There is no abdominal lymphadenopathy.  Evaluation is decompressed bladder is limited by metallic artifact from the bilateral hip replacements. There is a small left pleural  effusion. There is compression atelectasis of the bilateral posterior lung bases. There are patchy airspace consolidation the of the posterior right upper lobe suspicious for pneumonia.  IMPRESSION: Findings consistent with small bowel obstruction. In the right mid abdomen, there is a small bowel loop with the extensive bowel content and crecent air anteriorly; this is more likely due to air surrounding stool and less likely due to pneumatosis. Clinical correlation with lactose level is recommended.  Colonic bowel wall thickening of ascending, transverse, and descending colon. This is in part due to decompressed colon. However, colitis or ischemic colon are not excluded on this noncontrast exam.  Right upper lobe pneumonia.   Electronically Signed   By: Abelardo Diesel M.D.   On: 09/25/2013 17:56   Dg Chest 2 View  09/09/2013   CLINICAL DATA:  Leukocytosis.  Preop back surgery.  EXAM: CHEST  2 VIEW  COMPARISON:  07/21/2013  FINDINGS: The patient is status post median sternotomy and CABG procedure. There is mild to moderate cardiac enlargement. Chronic asymmetric elevation of the left hemidiaphragm is noted. There is a new left pleural effusion identified. Atelectasis is present in both lung bases. There is advanced osteoarthritis involving both glenohumeral joints.  IMPRESSION: 1. New left pleural effusion.   Electronically Signed   By: Kerby Moors M.D.   On: 09/09/2013 18:54   Ct Lumbar Spine Wo Contrast  09/09/2013   CLINICAL DATA:  Back pain.  Fall 6 weeks ago.  L1 fracture.  EXAM: CT LUMBAR SPINE WITHOUT CONTRAST  TECHNIQUE: Multidetector CT imaging of the lumbar spine was performed without intravenous contrast administration. Multiplanar CT image reconstructions were also  generated.  COMPARISON:  09/08/2013  FINDINGS: The spinous processes, facets, an vertebral bodies are all fused in the lumbar spine suggesting severe and advanced ankylosing spondylitis.  There is a transverse cleft in the L1 vertebral  body extending longitudinally into the pedicles. This cleft has fluid signal and tiny locules of gas signal, and the fracture appears to continue through the posterior elements in a nondisplaced manner. No subluxation. Irregular margins of the fracture somewhat intact interdigitate.  The T11-12 level does not appear completely fused although there is fusion at T11.  Severe bony demineralization.  The sacroiliac joints are completely fused.  Scattered exophytic lesions of both kidneys have varying complexity.  There is paraspinal hematoma in the vicinity of the fracture. I do not demonstrate significant bony retropulsion.  Aortoiliac atherosclerotic vascular disease is present.  There is considerable abnormal wall thickening and 3 long segment of the sigmoid colon, potentially from prominent inflammation or less likely tumor. Adjacent mesenteric edema noted.  Mild levoconvex lumbar scoliosis. Scattered small retroperitoneal lymph nodes are present.  IMPRESSION: 1. Acute nondisplaced transverse fracture through the vertebral body and posterior elements of L1 (Chance fracture), with associated paraspinal edema. 2. Markedly severe manifestations of ankylosing spondylitis with complete anterior and posterior fusion in the lumbar spine and complete fusion of the sacroiliac joints. 3. Severe wall thickening in the sigmoid colon with surrounding stranding, potentially from severe colitis or tumor. 4. Bilateral complex renal exophytic lesions, probably complex cysts although these are not characterized on today's exam with regard to malignant potential.   Electronically Signed   By: Sherryl Barters M.D.   On: 09/09/2013 17:44   Ir Fluoro Guide Cv Line Right  09/28/2013   CLINICAL DATA:  Non functional left arm AV dialysis fistula, not amenable to percutaneous intervention. Dialysis access needed.  EXAM: TUNNELED HEMODIALYSIS CATHETER PLACEMENT WITH ULTRASOUND AND FLUOROSCOPIC GUIDANCE  TECHNIQUE: The procedure, risks,  benefits, and alternatives were explained to the patient. Questions regarding the procedure were encouraged and answered. The patient understands and consents to the procedure. As antibiotic prophylaxis, cefazolin 1 g was ordered pre-procedure and administered intravenously within one hour of incision.Patency of the right IJ vein was confirmed with ultrasound with image documentation. An appropriate skin site was determined. Region was prepped using maximum barrier technique including cap and mask, sterile gown, sterile gloves, large sterile sheet, and Chlorhexidine as cutaneous antisepsis. The region was infiltrated locally with 1% lidocaine.  Intravenous Fentanyl and Versed were administered as conscious sedation during continuous cardiorespiratory monitoring by the radiology RN, with a total moderate sedation time of 80 minutes.  Under real-time ultrasound guidance, the right IJ vein was accessed with a 21 gauge micropuncture needle; the needle tip within the vein was confirmed with ultrasound image documentation. Needle exchanged over the 018 guidewire for transitional dilator, which allowed advancement of a Benson wire into the IVC. Over this, an MPA catheter was advanced. A Hemosplit 19 hemodialysis catheter was tunneled from the right anterior chest wall approach to the right IJ dermatotomy site. The MPA catheter was exchanged over an Amplatz wire for serial vascular dilators which allow placement of a peel-away sheath, through which the catheter was advanced under intermittent fluoroscopy, positioned with its tips in the proximal and midright atrium. Spot chest radiograph confirms good catheter position. No pneumothorax. Catheter was flushed and primed per protocol. Catheter secured externally with O Prolene sutures. The right IJ dermatotomy site was closed with 3-0 Monocryl subcutaneous suture and covered with Dermabond. No immediate complication.  COMPARISON:  None  FLUOROSCOPY TIME:  10 min 12 seconds   ACCESS: Remains approachable for percutaneous intervention as needed.  IMPRESSION: 1. Technically successful placement of tunneled right IJ hemodialysis catheter with ultrasound and fluoroscopic guidance. Ready for routine use.   Electronically Signed   By: Arne Cleveland M.D.   On: 09/28/2013 14:10   Ir Angio Av Shunt Addl Access  09/28/2013   CLINICAL DATA:  End stage renal disease, poor function of left arm dialysis fistula.  TECHNIQUE: DIALYSIS SHUNTOGRAM  The procedure, risks (including but not limited to bleeding, infection, organ damage ), benefits, and alternatives were explained to the patient. Questions regarding the procedure were encouraged and answered. The patient understands and consents to the procedure.  In preparation for possible declot procedure, the skin overlying the left forearm fistula was prepped with Betadine, draped in usual sterile fashion, infiltrated locally with 1% lidocaine.  Intravenous Fentanyl and Versed were administered as conscious sedation during continuous cardiorespiratory monitoring by the radiology RN, with a total moderate sedation time of eighty minutes.  The outflow vein from the fistula was accessed just central to the fistula with a 21 gauge micropuncture needle under real-time ultrasound guidance. Needle was exchanged over a 018 guide wire for a 4 French transitional dilator. This was used for outflow venography.  The outflow vein was then accessed more centrally retrograde with a 21 gauge micropuncture needle under real-time ultrasound guidance. The needle was exchanged over the 018 wire for the transitional dilator, which allow placement of a Bentson wire. Over this, a 5 [place] Kumpe catheter was advanced and used to negotiate the arterial anastomosis for additional fistulography. Attempts were made to catheterize the proximal radial artery using a variety of catheter & guidewire combinations but these were ultimately unsuccessful. Assessment of the arterial  anatomy of the forearm was performed with a blood pressure cuff at the upper arm level.  Ultimately, No declot procedure was needed. The access sites were removed and hemostasis achieved with manual compression.  No immediate complication.  COMPARISON:  None  FLUOROSCOPY TIME:  10 min 12 seconds  ACCESS: Proximal left radial artery occlusion. Not amenable to percutaneous intervention. Consider surgical consultation.  FINDINGS: The outflow cephalic vein from the fistula is widely patent. Central venous system through the SVC is patent. There is a competing outflow vein at the level of the midforearm. There is stenosis at the arterial anastomosis. The proximal radial artery is occluded, not opacified central to the level of the fistula. There is retrograde flow in the distal radial artery supplied by palmar arches, supplying low-grade flow across the fistula.  IMPRESSION: 1. Proximal left radial artery occlusion. Collateral flow via palmar arches reconstitutes the distal radial artery, with retrograde flow into the fistula, at an inadequate rate. This is not amenable to percutaneous intervention. After telephone consultation with Dr. Jonnie Finner, plan made to proceed with hemodialysis catheter placement.   Electronically Signed   By: Arne Cleveland M.D.   On: 09/28/2013 14:17   Ir US Guide Vasc Access Left  09/28/2013   CLINICAL DATA:  End stage renal disease, poor function of left arm dialysis fistula.  TECHNIQUE: DIALYSIS SHUNTOGRAM  The procedure, risks (including but not limited to bleeding, infection, organ damage ), benefits, and alternatives were explained to the patient. Questions regarding the procedure were encouraged and answered. The patient understands and consents to the procedure.  In preparation for possible declot procedure, the skin overlying the left forearm fistula was prepped with Betadine, draped  in usual sterile fashion, infiltrated locally with 1% lidocaine.  Intravenous Fentanyl and Versed  were administered as conscious sedation during continuous cardiorespiratory monitoring by the radiology RN, with a total moderate sedation time of eighty minutes.  The outflow vein from the fistula was accessed just central to the fistula with a 21 gauge micropuncture needle under real-time ultrasound guidance. Needle was exchanged over a 018 guide wire for a 4 French transitional dilator. This was used for outflow venography.  The outflow vein was then accessed more centrally retrograde with a 21 gauge micropuncture needle under real-time ultrasound guidance. The needle was exchanged over the 018 wire for the transitional dilator, which allow placement of a Bentson wire. Over this, a 5 [place] Kumpe catheter was advanced and used to negotiate the arterial anastomosis for additional fistulography. Attempts were made to catheterize the proximal radial artery using a variety of catheter & guidewire combinations but these were ultimately unsuccessful. Assessment of the arterial anatomy of the forearm was performed with a blood pressure cuff at the upper arm level.  Ultimately, No declot procedure was needed. The access sites were removed and hemostasis achieved with manual compression.  No immediate complication.  COMPARISON:  None  FLUOROSCOPY TIME:  10 min 12 seconds  ACCESS: Proximal left radial artery occlusion. Not amenable to percutaneous intervention. Consider surgical consultation.  FINDINGS: The outflow cephalic vein from the fistula is widely patent. Central venous system through the SVC is patent. There is a competing outflow vein at the level of the midforearm. There is stenosis at the arterial anastomosis. The proximal radial artery is occluded, not opacified central to the level of the fistula. There is retrograde flow in the distal radial artery supplied by palmar arches, supplying low-grade flow across the fistula.  IMPRESSION: 1. Proximal left radial artery occlusion. Collateral flow via palmar arches  reconstitutes the distal radial artery, with retrograde flow into the fistula, at an inadequate rate. This is not amenable to percutaneous intervention. After telephone consultation with Dr. Jonnie Finner, plan made to proceed with hemodialysis catheter placement.   Electronically Signed   By: Arne Cleveland M.D.   On: 09/28/2013 14:17   Ir US Guide Vasc Access Right  09/28/2013   CLINICAL DATA:  Non functional left arm AV dialysis fistula, not amenable to percutaneous intervention. Dialysis access needed.  EXAM: TUNNELED HEMODIALYSIS CATHETER PLACEMENT WITH ULTRASOUND AND FLUOROSCOPIC GUIDANCE  TECHNIQUE: The procedure, risks, benefits, and alternatives were explained to the patient. Questions regarding the procedure were encouraged and answered. The patient understands and consents to the procedure. As antibiotic prophylaxis, cefazolin 1 g was ordered pre-procedure and administered intravenously within one hour of incision.Patency of the right IJ vein was confirmed with ultrasound with image documentation. An appropriate skin site was determined. Region was prepped using maximum barrier technique including cap and mask, sterile gown, sterile gloves, large sterile sheet, and Chlorhexidine as cutaneous antisepsis. The region was infiltrated locally with 1% lidocaine.  Intravenous Fentanyl and Versed were administered as conscious sedation during continuous cardiorespiratory monitoring by the radiology RN, with a total moderate sedation time of 80 minutes.  Under real-time ultrasound guidance, the right IJ vein was accessed with a 21 gauge micropuncture needle; the needle tip within the vein was confirmed with ultrasound image documentation. Needle exchanged over the 018 guidewire for transitional dilator, which allowed advancement of a Benson wire into the IVC. Over this, an MPA catheter was advanced. A Hemosplit 19 hemodialysis catheter was tunneled from the right anterior chest wall  approach to the right IJ  dermatotomy site. The MPA catheter was exchanged over an Amplatz wire for serial vascular dilators which allow placement of a peel-away sheath, through which the catheter was advanced under intermittent fluoroscopy, positioned with its tips in the proximal and midright atrium. Spot chest radiograph confirms good catheter position. No pneumothorax. Catheter was flushed and primed per protocol. Catheter secured externally with O Prolene sutures. The right IJ dermatotomy site was closed with 3-0 Monocryl subcutaneous suture and covered with Dermabond. No immediate complication.  COMPARISON:  None  FLUOROSCOPY TIME:  10 min 12 seconds  ACCESS: Remains approachable for percutaneous intervention as needed.  IMPRESSION: 1. Technically successful placement of tunneled right IJ hemodialysis catheter with ultrasound and fluoroscopic guidance. Ready for routine use.   Electronically Signed   By: Arne Cleveland M.D.   On: 09/28/2013 14:10   Dg Chest Port 1 View  10/01/2013   CLINICAL DATA:  Atelectasis.  EXAM: PORTABLE CHEST - 1 VIEW  COMPARISON:  Chest radiograph 09/30/2013.  FINDINGS: Right-sided hemodialysis catheter unchanged. Left-sided central venous catheter tip projects over the superior cavoatrial junction. Interval extubation. NG tube tip and side-port project over the left upper quadrant. Stable cardiac and mediastinal contours. Small bilateral pleural effusions with underlying opacities likely representing atelectasis.  IMPRESSION: Marked gaseous distention of the visualized upper abdominal bowel loops, recommend correlation with dedicated abdominal radiography.  Interval extubation.  Otherwise stable support apparatus.  Small bilateral pleural effusions.  Underlying atelectasis.   Electronically Signed   By: Lovey Newcomer M.D.   On: 10/01/2013 08:08   Dg Chest Port 1 View  09/30/2013   CLINICAL DATA:  Central line placement  EXAM: PORTABLE CHEST - 1 VIEW  COMPARISON:  Air radiograph 09/29/2013  FINDINGS: There  has been interval placement of a left IJ central venous catheter with tip overlying the cavoatrial junction. Tip of the endotracheal tube is positioned 3.6 cm above the carina. Right-sided hemodialysis catheter is unchanged.  Sequelae of prior CABG again noted.  Lungs are hypoinflated. Bilateral pleural effusions, left greater than right, are slightly improved relative to prior study. Associated bibasilar atelectasis is present. No new focal infiltrate. No overt pulmonary edema. No pneumothorax.  Osseous structures are unchanged.  IMPRESSION: 1. Tip of the left IJ central venous catheter overlying the cavoatrial junction. Remaining support apparatus as above. 2. Slight interval decrease in size of bilateral pleural effusions, left greater than right, with similar bibasilar atelectasis.   Electronically Signed   By: Jeannine Boga M.D.   On: 09/30/2013 03:23   Dg Chest Port 1 View  09/29/2013   CLINICAL DATA:  Status post right partial colectomy  EXAM: PORTABLE CHEST - 1 VIEW  COMPARISON:  02/17/2013  FINDINGS: Cardiac shadow remains mildly enlarged. A dialysis catheter is seen in satisfactory position. An endotracheal tube is now noted 3.8 cm above the carina. A nasogastric catheter extends into the stomach. Bilateral pleural effusions left greater than right are seen. The left effusion appears chronic in nature when compare with the previous exam from multiple months previous. No new focal abnormality is seen.  IMPRESSION: Tubes and lines as described.  Pleural effusions left greater than right. The left effusion appears chronic in nature.   Electronically Signed   By: Inez Catalina M.D.   On: 09/29/2013 18:00   Dg Abd 2 Views  09/28/2013   CLINICAL DATA:  Small bowel obstruction. Abdominal distention. NG tube placement.  EXAM: ABDOMEN - 2 VIEW  COMPARISON:  09/27/2013.  FINDINGS: Supine and left lateral decubitus views of the abdomen again demonstrate diffuse dilatation of the small bowel loops in the  central abdomen measuring up to 4.3 cm in diameter. Multiple air-fluid levels are noted on the left lateral decubitus view. No frank pneumoperitoneum. Paucity of distal colonic gas, although there is a small amount of rectal gas present. Nasogastric tube appears coiled in the stomach. 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  IMPRESSION: 1. Bowel gas pattern remains compatible with a small bowel obstruction. 2. No pneumoperitoneum at this time. 3. Nasogastric tube remains coiled in the stomach.   Electronically Signed   By: Vinnie Langton M.D.   On: 09/28/2013 10:38   Dg Abd 2 Views  09/27/2013   CLINICAL DATA:  Mid abdominal pain, back pain  EXAM: ABDOMEN - 2 VIEW  COMPARISON:  DG ABD PORTABLE 1V dated 09/26/2013  FINDINGS: Multiple dilated loops small bowel appreciated throughout the abdomen. Air-fluid levels identified. There is no evidence of free air. A moderate amount of stools appreciated region of the ascending colon. There is a paucity of distal bowel gas. The NG tube is seen with tip curled in the region of the stomach. When compared to previous study the dilated loops of small bowel have decreased in size and distribution.  IMPRESSION: Findings consistent with an improving small bowel obstruction. There is no evidence of free air.   Electronically Signed   By: Margaree Mackintosh M.D.   On: 09/27/2013 13:27   Dg Abd Portable 1v  10/02/2013   CLINICAL DATA:  Evaluate for resolution of ileus  EXAM: PORTABLE ABDOMEN - 1 VIEW  COMPARISON:  DG ABD PORTABLE 1V dated 10/01/2013; DG ABD PORTABLE 2V dated 09/29/2013; CT ABD/PELV WO CM dated 09/25/2013  FINDINGS: There is a paucity of bowel gas. There is persistent moderate gas distention of several mildly dilated loops of rather featureless appearing small bowel within the left mid hemi abdomen though there is decreased gaseous distention of these bowel loops. A minimal amount of air is seen  within the ascending and descending colon.  Nondiagnostic evaluation pneumoperitoneum. An enteric tube and side port overlie the expected location of the mid body of the stomach.  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 Post bilateral total hip replacements, incompletely imaged.  IMPRESSION: 1. Findings most suggestive of improving ileus, though note, there is persistent mild gas distention of several featureless loops of small bowel within the mid abdomen. Clinical correlation is advised. 2. Sequela of ankylosing spondylitis   Electronically Signed   By: Sandi Mariscal M.D.   On: 10/02/2013 08:21   Dg Abd Portable 1v  10/01/2013   CLINICAL DATA:  Distension.  Recent hemicolectomy with colostomy.  EXAM: PORTABLE ABDOMEN - 1 VIEW  COMPARISON:  09/29/2013 and 09/28/2013  FINDINGS: Central venous catheter unchanged with tip over the right atrium. Nasogastric tube is present with tip over the midline upper abdomen likely over the distal stomach. There is continued evidence of multiple air-filled dilated small bowel loops. There is minimal air within the colon. There is continued evidence of an unusual air collection in the right upper quadrant as cannot exclude free peritoneal air as seen on the prior study. Remainder of the exam is unchanged.  IMPRESSION: Persistent air-filled dilated small bowel loops with paucity of colonic gas. Findings suggesting small bowel obstructive process. Persistent unusual air collection over the right upper quadrant as this may represent free  peritoneal air as seen previously likely due to patient's recent hemicolectomy.  Nasogastric tube with tip over the mid abdomen likely in the distal stomach.  Findings discussed with patient's nurse, Morey Hummingbird, at the time of dictation.   Electronically Signed   By: Marin Olp M.D.   On: 10/01/2013 14:56   Dg Abd Portable 1v  09/26/2013   CLINICAL DATA:  Small bowel obstruction, abdominal pain and  distention  EXAM: PORTABLE ABDOMEN - 1 VIEW  COMPARISON:  Prior abdominal radiograph 09/25/2013  FINDINGS: Persistent gaseous distension of the stomach and multiple loops of small bowel. The tip of the nasogastric tube projects over the gastric bubble, however the proximal side hole is in the region of the gastroesophageal junction. The degree of small-bowel distention is not significantly changed since the prior study. Maximal small bowel diameter is again 5 cm. No large free air on this single supine radiograph. Surgical changes of prior median sternotomy and bilateral total hip arthroplasties. The bones are osteopenic. Atherosclerotic vascular calcifications noted in the aorta.  IMPRESSION: 1. No significant interval change in the appearance of high-grade small bowel obstruction. Maximal small bowel diameter is again 5 cm. 2. The proximal side port of the nasogastric tube is in the GE junction. Consider advancing 5 cm for more optimal placement.   Electronically Signed   By: Jacqulynn Cadet M.D.   On: 09/26/2013 07:35   Dg Abd Portable 1v  09/25/2013   CLINICAL DATA:  Nasogastric tube placement  EXAM: PORTABLE ABDOMEN - 1 VIEW  COMPARISON:  CT abdomen September 25, 2013  FINDINGS: Nasogastric tube is identified with distal tip in the mid stomach. The stomach is air-filled and distended. There are multiple dilated small bowel loops.  IMPRESSION: Naso gastric tube distal tip in the mid stomach. Small bowel obstruction.   Electronically Signed   By: Abelardo Diesel M.D.   On: 09/25/2013 22:56   Dg Abd Portable 2v  10/06/2013   CLINICAL DATA:  Severe abdominal distention and pain. Postop from right hemicolectomy on 09/29/2013.  EXAM: PORTABLE ABDOMEN - 2 VIEW  COMPARISON:  10/02/2013  FINDINGS: Increased dilatation of small bowel loops seen. A moderate amount of free intraperitoneal air is also demonstrated and appears increased since previous study. Previous seen nasogastric tube is no longer visualized.   IMPRESSION: Increased small bowel dilatation and free intraperitoneal air since prior study, highly suspicious for anastomotic leak or perforated viscus.  Critical Value/emergent results were called by telephone at the time of interpretation on 10/06/2013 at 2:01 PM to the patient's floor nurse Jonni Sanger, who verbally acknowledged these results.   Electronically Signed   By: Earle Gell M.D.   On: 10/06/2013 14:04   Dg Abd Portable 2v  09/29/2013   CLINICAL DATA:  Status post colonoscopy. Question free intraperitoneal air.  EXAM: PORTABLE ABDOMEN - 2 VIEW  COMPARISON:  CT abdomen and pelvis 09/25/2013.  FINDINGS: There is a large volume of free intraperitoneal air. NG tube is in place with tip in the stomach.  IMPRESSION: Study is positive for free intraperitoneal air.  Critical Value/emergent results were called by telephone at the time of interpretation on 09/29/2013 at 12:27 PM to Dr. Ronald Lobo , who verbally acknowledged these results.   Electronically Signed   By: Inge Rise M.D.   On: 09/29/2013 12:30   Ir Shuntogram/ Fistulagram Left Mod Sed  09/28/2013   CLINICAL DATA:  End stage renal disease, poor function of left arm dialysis fistula.  TECHNIQUE: DIALYSIS SHUNTOGRAM  The procedure, risks (including but not limited to bleeding, infection, organ damage ), benefits, and alternatives were explained to the patient. Questions regarding the procedure were encouraged and answered. The patient understands and consents to the procedure.  In preparation for possible declot procedure, the skin overlying the left forearm fistula was prepped with Betadine, draped in usual sterile fashion, infiltrated locally with 1% lidocaine.  Intravenous Fentanyl and Versed were administered as conscious sedation during continuous cardiorespiratory monitoring by the radiology RN, with a total moderate sedation time of eighty minutes.  The outflow vein from the fistula was accessed just central to the fistula with a 21 gauge  micropuncture needle under real-time ultrasound guidance. Needle was exchanged over a 018 guide wire for a 4 French transitional dilator. This was used for outflow venography.  The outflow vein was then accessed more centrally retrograde with a 21 gauge micropuncture needle under real-time ultrasound guidance. The needle was exchanged over the 018 wire for the transitional dilator, which allow placement of a Bentson wire. Over this, a 5 [place] Kumpe catheter was advanced and used to negotiate the arterial anastomosis for additional fistulography. Attempts were made to catheterize the proximal radial artery using a variety of catheter & guidewire combinations but these were ultimately unsuccessful. Assessment of the arterial anatomy of the forearm was performed with a blood pressure cuff at the upper arm level.  Ultimately, No declot procedure was needed. The access sites were removed and hemostasis achieved with manual compression.  No immediate complication.  COMPARISON:  None  FLUOROSCOPY TIME:  10 min 12 seconds  ACCESS: Proximal left radial artery occlusion. Not amenable to percutaneous intervention. Consider surgical consultation.  FINDINGS: The outflow cephalic vein from the fistula is widely patent. Central venous system through the SVC is patent. There is a competing outflow vein at the level of the midforearm. There is stenosis at the arterial anastomosis. The proximal radial artery is occluded, not opacified central to the level of the fistula. There is retrograde flow in the distal radial artery supplied by palmar arches, supplying low-grade flow across the fistula.  IMPRESSION: 1. Proximal left radial artery occlusion. Collateral flow via palmar arches reconstitutes the distal radial artery, with retrograde flow into the fistula, at an inadequate rate. This is not amenable to percutaneous intervention. After telephone consultation with Dr. Jonnie Finner, plan made to proceed with hemodialysis catheter  placement.   Electronically Signed   By: Arne Cleveland M.D.   On: 09/28/2013 14:17   Mr Outside Films Spine  09/09/2013   This examination belongs to an outside facility and is stored  here for comparison purposes only.  Contact the originating outside  institution for any associated report or interpretation.        Subjective: Patient states that pain is a little better with the addition of OxyContin. The patient is able to sit up in a chair for short time today. Denies any vomiting, chest pain, shortness breath, headache, dizziness. Abdominal pain still persists.  Objective: Filed Vitals:   10/08/13 0527 10/08/13 0940 10/08/13 1300 10/08/13 1836  BP: 100/67 109/68 125/72 119/70  Pulse: 84 86 74 85  Temp: 97.6 F (36.4 C) 97 F (36.1 C) 98.4 F (36.9 C) 97.7 F (36.5 C)  TempSrc: Oral Oral Oral Oral  Resp: 16 18 17 18   Height:      Weight:      SpO2: 91% 97% 99% 96%    Intake/Output Summary (Last 24 hours) at 10/08/13 1907 Last data filed  at 10/08/13 1900  Gross per 24 hour  Intake    840 ml  Output    245 ml  Net    595 ml   Weight change: 0.1 kg (3.5 oz) Exam:   General:  Pt is alert, follows commands appropriately, not in acute distress  HEENT: No icterus, No thrush,  [place]/AT  Cardiovascular: RRR, S1/S2, no rubs, no gallops  Respiratory: Bibasilar crackles. No wheezing. Good air movement.  Abdomen: Soft/+BS, non tender, non distended, no guarding  Extremities: trace LE edema, No lymphangitis, No petechiae, No rashes, no synovitis  Data Reviewed: Basic Metabolic Panel:  Recent Labs Lab 10/02/13 0357 10/03/13 0400 10/04/13 1030 10/05/13 0540 10/07/13 0658  NA 140 137  --  141 138  K 3.1* 3.4* 3.9 3.8 4.2  CL 102 99  --  104 99  CO2 22 22  --  26 28  GLUCOSE 93 94  --  106* 70  BUN 14 21  --  16 14  CREATININE 2.17* 2.92*  --  2.57* 2.44*  CALCIUM 7.0* 7.3*  --  7.5* 7.6*  PHOS  --  4.0  --  3.2 3.3   Liver Function Tests:  Recent Labs Lab  10/03/13 0400 10/05/13 0540 10/07/13 0658  ALBUMIN 1.5* 1.4* 1.5*   No results found for this basename: LIPASE, AMYLASE,  in the last 168 hours No results found for this basename: AMMONIA,  in the last 168 hours CBC:  Recent Labs Lab 10/03/13 0400 10/05/13 0540 10/06/13 0525 10/07/13 0658 10/08/13 0405  WBC 15.8* 12.7* 14.3* 14.6* 11.6*  HGB 8.7* 9.1* 9.2* 9.6* 9.7*  HCT 27.5* 28.9* 29.4* 30.6* 31.6*  MCV 95.2 96.0 96.7 97.1 97.8  PLT 265 329 363 411* 360   Cardiac Enzymes: No results found for this basename: CKTOTAL, CKMB, CKMBINDEX, TROPONINI,  in the last 168 hours BNP: No components found with this basename: POCBNP,  CBG:  Recent Labs Lab 10/06/13 0024 10/06/13 0756 10/07/13 1141 10/07/13 1707 10/08/13 0729  GLUCAP 73 128* 97 138* 73    Recent Results (from the past 240 hour(s))  CLOSTRIDIUM DIFFICILE BY PCR     Status: None   Collection Time    10/04/13 10:17 AM      Result Value Ref Range Status   C difficile by pcr NEGATIVE  NEGATIVE Final     Scheduled Meds: . acetaminophen  1,000 mg Oral TID  . amiodarone  200 mg Oral Daily  . antiseptic oral rinse  1 application Mouth Rinse QID  . calcitonin (salmon)  1 spray Alternating Nares Daily  . chlorhexidine  15 mL Mouth/Throat BID  . [START ON 10/10/2013] darbepoetin  150 mcg Intravenous Q Mon-HD  . doxercalciferol  2 mcg Intravenous Q M,W,F-HD  . feeding supplement (NEPRO CARB STEADY)  237 mL Oral Q24H  . feeding supplement (RESOURCE BREEZE)  1 Container Oral BID BM  . ferric gluconate (FERRLECIT/NULECIT) IV  62.5 mg Intravenous Q Fri-HD  . gabapentin  300 mg Oral Daily  . hydrALAZINE  10 mg Oral Once  . lidocaine  1 patch Transdermal Q24H  . methocarbamol  750 mg Oral QID  . midodrine  10 mg Oral BID WC  . multivitamin  1 tablet Oral QHS  . OxyCODONE  15 mg Oral Q12H  . pantoprazole  40 mg Oral Q1200  . psyllium  1 packet Oral BID  . traMADol  50 mg Oral Q12H   Continuous Infusions: . sodium  chloride Stopped (10/03/13  1300)     Aseem Sessums, DO  [facility] Pager [phone]  If 7PM-7AM, please contact night-coverage [email or web address] Password [email or web address] 10/08/2013, 7:07 PM   LOS: 13 days

## 2013-10-09 LAB — GLUCOSE, CAPILLARY
GLUCOSE-CAPILLARY: 94 mg/dL (ref 70–99)
Glucose-Capillary: 131 mg/dL — ABNORMAL HIGH (ref 70–99)
Glucose-Capillary: 138 mg/dL — ABNORMAL HIGH (ref 70–99)
Glucose-Capillary: 263 mg/dL — ABNORMAL HIGH (ref 70–99)

## 2013-10-09 MED ORDER — OXYCODONE HCL ER 10 MG PO T12A
20.0000 mg | EXTENDED_RELEASE_TABLET | Freq: Two times a day (BID) | ORAL | Status: DC
  Administered 2013-10-09 – 2013-10-12 (×5): 20 mg via ORAL
  Filled 2013-10-09 (×6): qty 2

## 2013-10-09 MED ORDER — SIMETHICONE 80 MG PO CHEW
80.0000 mg | CHEWABLE_TABLET | Freq: Four times a day (QID) | ORAL | Status: DC | PRN
  Filled 2013-10-09 (×2): qty 1

## 2013-10-09 NOTE — Progress Notes (Signed)
[facility] Progress Note  Subjective:   Endorses gas. Abdominal pain better, but still uncomfortable.  Not eating. Requests regular diet.  Objective Filed Vitals:   10/08/13 2107 10/09/13 0537 10/09/13 0920 10/09/13 1003  BP: 99/79 90/63  101/62  Pulse: 79 76  93  Temp: 98.6 F (37 C) 97.9 F (36.6 C)  97.7 F (36.5 C)  TempSrc: Oral Oral  Oral  Resp: 16 16  17   Height:      Weight:   68.5 kg (151 lb 0.2 oz)   SpO2: 95% 96%  95%   Physical Exam General: alert, cooperative, NAD Heart: RRR, no murmur or rub Lungs: CTA bilat, no appreciable wheezes or rhonchi Abdomen: Distended, + BS, no guarding, midline wound with significant serous drainage (abd pads in place) Extremities: No LE edema Dialysis Access: R [facility]  Dialysis: [facility]  4h 69kg 2K//2.25[place]ath Heparin 5000 LUE AVF (failed now, has R IJ cath)  Hectorol 2 Epo 5000 Venofer (weekly, started this admit)  Last labs: Hb 9.9 last pth 296   Assessment/Plan: 1. Bowel obstruction - s/p R colectomy, repair of serosal tears, & ileostomy 2/12. 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 Off [place]. MedOnc f/u outpatient 2. ESRD - MWF, K 4.2 , HD tomorrow. Continue no heparin HD 3. HTN/Volume - SBPs 100s on Midodrine BID; Poor intake, under edw at times. UF as tolerated 4. Anemia - Hgb 9.7. Continue Aranesp 150 q Mon. Tsat 27% s/p op Fe load completed 1/21. Ferritin 803. Now on weekly Fe 5. 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 Renvela 1 ac held last admit for low Phos. May need alternate binder at d/c given present GI issues. Cont Vit D  6. Malnutrition - Change to regular diet with fluid restriction per pt request (losing wgt/labs ok) ONS, multivitamin  7. L1 vertebral fx/Ankylosing spondylitis - sec to fall, managed non-operatively.  8. Hx CAD/A-fib - On amiodarone. No anticoagulation sec to Hx GIB 9. HD access - using right TDC. LFA AVF unusable  due to inflow failure. VVS consulted for permanent access. New LUA AVF on hold pending improvement in current issues. 10. Dispo - Pending improvement in current issues. Wants to return to [facility].  Collene Leyden. Cletus Gash, PA-C [facility] Pager [phone] 10/09/2013,10:26 AM  LOS: 14 days  Renal Attending: Agree with note and assessment and evaluation as articulated by Ms. Roland Rack C   Additional Objective Labs: Basic Metabolic Panel:  Recent Labs Lab 10/03/13 0400 10/04/13 1030 10/05/13 0540 10/07/13 0658  NA 137  --  141 138  K 3.4* 3.9 3.8 4.2  CL 99  --  104 99  CO2 22  --  26 28  GLUCOSE 94  --  106* 70  BUN 21  --  16 14  CREATININE 2.92*  --  2.57* 2.44*  CALCIUM 7.3*  --  7.5* 7.6*  PHOS 4.0  --  3.2 3.3   Liver Function Tests:  Recent Labs Lab 10/03/13 0400 10/05/13 0540 10/07/13 0658  ALBUMIN 1.5* 1.4* 1.5*   No results found for this basename: LIPASE, AMYLASE,  in the last 168 hours CBC:  Recent Labs Lab 10/03/13 0400 10/05/13 0540 10/06/13 0525 10/07/13 0658 10/08/13 0405  WBC 15.8* 12.7* 14.3* 14.6* 11.6*  HGB 8.7* 9.1* 9.2* 9.6* 9.7*  HCT 27.5* 28.9* 29.4* 30.6* 31.6*  MCV 95.2 96.0 96.7 97.1 97.8  PLT 265 329 363 411*  360   Blood Culture    Component Value Date/Time   SDES BLOOD RIGHT HAND 02/16/2013 2116   SPECREQUEST BOTTLES DRAWN AEROBIC AND ANAEROBIC 10CC 02/16/2013 2116   CULT NO GROWTH 5 DAYS 02/16/2013 2116   REPTSTATUS 02/23/2013 FINAL 02/16/2013 2116    CBG:  Recent Labs Lab 10/07/13 1141 10/07/13 1707 10/08/13 0729 10/08/13 2119 10/09/13 0813  GLUCAP 97 138* 73 93 94    Studies/Results: No results found. Medications: . sodium chloride Stopped (10/03/13 1300)   . acetaminophen  1,000 mg Oral TID  . amiodarone  200 mg Oral Daily  . antiseptic oral rinse  1 application Mouth Rinse QID  . calcitonin (salmon)  1 spray Alternating Nares Daily  . chlorhexidine  15 mL Mouth/Throat BID  . [START ON  10/10/2013] darbepoetin  150 mcg Intravenous Q Mon-HD  . doxercalciferol  2 mcg Intravenous Q M,W,F-HD  . feeding supplement (NEPRO CARB STEADY)  237 mL Oral Q24H  . feeding supplement (RESOURCE BREEZE)  1 Container Oral BID BM  . ferric gluconate (FERRLECIT/NULECIT) IV  62.5 mg Intravenous Q Fri-HD  . gabapentin  300 mg Oral Daily  . hydrALAZINE  10 mg Oral Once  . lidocaine  1 patch Transdermal Q24H  . methocarbamol  750 mg Oral QID  . midodrine  10 mg Oral BID WC  . multivitamin  1 tablet Oral QHS  . OxyCODONE  15 mg Oral Q12H  . pantoprazole  40 mg Oral Q1200  . psyllium  1 packet Oral BID  . traMADol  50 mg Oral Q12H

## 2013-10-09 NOTE — Progress Notes (Signed)
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  Right colon perforation  -Repeat abdominal x-ray 10/06/13 showed increased free air  -10/06/2013 CT abdomen and pelvis shows moderate left effusion with atelectasis with right basilar opacity  -appreciate surgery followup-->plans noted for wound VAC  -continue soft diet -mobilize  -Patient finished 7 days of ertapenem 10/06/13  Right basilar opacity  -Doubt pneumonia  -No fevers or respiratory distress or shortness of breath  -Patient finished 7 days of ertapenem 10/06/13  L1 vertebral fx/Ankylosing spondylitis/uncontrolled pain  -secondary to fall, managed non-operatively  -although patient has identifiable source of pain, the patient was soundly sleeping when I visited him x 2  -Seems to have some improvement with Robaxin  -Added long acting opioid--> increase OxyContin 20 mg every 12 hours Adenocarcinoma of colon  -spoke with [facility]--Dr. 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  -f/u CBC-->Hgb stable  -transfuse for Hb < 7  -SCD for DVT prevention  Family Communication: Pt at beside  Disposition Plan: SNF on 10/10/13 if stable           Procedures/Studies: Ct Abdomen Pelvis Wo Contrast  10/06/2013   CLINICAL DATA:  Status post colectomy for colon cancer.  EXAM: CT ABDOMEN AND PELVIS WITHOUT CONTRAST  TECHNIQUE: Multidetector CT imaging of the abdomen and pelvis was performed following the standard protocol without intravenous contrast.  COMPARISON:  CT scan of September 25, 2013.  FINDINGS: L1 compression fracture is again noted. Moderate left pleural effusion is noted with left lower lobe atelectasis. Status post bilateral hip arthroplasties. Mild right basilar opacity is noted concerning for pneumonia or atelectasis with associated pleural effusion.  No focal abnormality is seen in the liver, spleen or pancreas on these unenhanced images. Dense sludge is noted in the gallbladder. Fluid is noted around the liver and spleen, as well as in the pelvis consistent with ascites. Colostomy is noted in the right lower quadrant and patient is status post right colectomy. Mild pneumoperitoneum is noted in the epigastric region. Midline surgical incision is noted. Small bowel dilatation is noted which may represent postoperative ileus. Bilateral renal atrophy is noted. Stable rounded and exophytic hyperdense and hypodense abnormalities are seen involving both kidneys most likely representing cysts. Renal ultrasound may be performed for further evaluation. Evaluation of the pelvis is limited due to scatter artifact from previously described bilateral hip arthroplasties. Atherosclerotic calcifications of abdominal aorta are noted without aneurysm formation. Adrenal glands appear  normal.  IMPRESSION: L1 compression fracture is again noted.   Moderate left pleural effusion is noted with left lower lobe atelectasis. Mild right basilar opacity is noted concerning for pneumonia or atelectasis.  Mild ascites is noted.  Status post right hemicolectomy with colostomy seen in right lower quadrant. Mildly dilated small bowel loops are noted most consistent with postoperative ileus, but distal obstruction cannot be excluded.  Stable rounded and exophytic hyperdense and hypodense abnormalities are seen involving both kidneys which may represent cysts; renal ultrasound is recommended for further evaluation.  Bilateral renal atrophy is noted.  Mild pneumoperitoneum is noted in the epigastric region most likely postoperative in nature.   Electronically Signed   By: Sabino Dick M.D.   On: 10/06/2013 21:16   Ct Abdomen Pelvis Wo Contrast  09/25/2013   CLINICAL DATA:  Vomiting, abdominal distention, constipation  EXAM: CT ABDOMEN AND PELVIS WITHOUT CONTRAST  TECHNIQUE: Multidetector CT imaging of the abdomen and pelvis was performed following the standard protocol without intravenous contrast.  COMPARISON:  None.  FINDINGS: There are multiple dilated small bowel loops throughout the abdomen and pelvis. In the right mid abdomen, there is a small bowel loop with the extensive bowel content and crecent air anteriorly; this is more likely due to air surrounding stool and less likely due to pneumatosis. There is bowel wall thickening in decompressed ascending, transverse and descending colon.  The liver, spleen, pancreas are normal. The gallbladder is distended. The adrenal glands are normal. The kidneys are atrophic. There are nonobstructing left kidney stones. There are small high and low density lesions in both kidneys, evaluation is limited without contrast. There is atherosclerosis of the abdominal aorta without aneurysmal dilatation. There is no abdominal lymphadenopathy.  Evaluation is decompressed bladder is limited by metallic artifact from the bilateral hip  replacements. There is a small left pleural effusion. There is compression atelectasis of the bilateral posterior lung bases. There are patchy airspace consolidation the of the posterior right upper lobe suspicious for pneumonia.  IMPRESSION: Findings consistent with small bowel obstruction. In the right mid abdomen, there is a small bowel loop with the extensive bowel content and crecent air anteriorly; this is more likely due to air surrounding stool and less likely due to pneumatosis. Clinical correlation with lactose level is recommended.  Colonic bowel wall thickening of ascending, transverse, and descending colon. This is in part due to decompressed colon. However, colitis or ischemic colon are not excluded on this noncontrast exam.  Right upper lobe pneumonia.   Electronically Signed   By: Abelardo Diesel M.D.   On: 09/25/2013 17:56   Dg Chest 2 View  09/09/2013   CLINICAL DATA:  Leukocytosis.  Preop back surgery.  EXAM: CHEST  2 VIEW  COMPARISON:  07/21/2013  FINDINGS: The patient is status post median sternotomy and CABG procedure. There is mild to moderate cardiac enlargement. Chronic asymmetric elevation of the left hemidiaphragm is noted. There is a new left pleural effusion identified. Atelectasis is present in both lung bases. There is advanced osteoarthritis involving both glenohumeral joints.  IMPRESSION: 1. New left pleural effusion.   Electronically Signed   By: Kerby Moors M.D.   On: 09/09/2013 18:54   Ct Lumbar Spine Wo Contrast  09/09/2013   CLINICAL DATA:  Back pain.  Fall 6 weeks ago.  L1 fracture.  EXAM: CT LUMBAR SPINE WITHOUT CONTRAST  TECHNIQUE: Multidetector CT imaging of the lumbar spine was performed without intravenous contrast administration. Multiplanar CT image reconstructions were also  generated.  COMPARISON:  09/08/2013  FINDINGS: The spinous processes, facets, an vertebral bodies are all fused in the lumbar spine suggesting severe and advanced ankylosing spondylitis.   There is a transverse cleft in the L1 vertebral body extending longitudinally into the pedicles. This cleft has fluid signal and tiny locules of gas signal, and the fracture appears to continue through the posterior elements in a nondisplaced manner. No subluxation. Irregular margins of the fracture somewhat intact interdigitate.  The T11-12 level does not appear completely fused although there is fusion at T11.  Severe bony demineralization.  The sacroiliac joints are completely fused.  Scattered exophytic lesions of both kidneys have varying complexity.  There is paraspinal hematoma in the vicinity of the fracture. I do not demonstrate significant bony retropulsion.  Aortoiliac atherosclerotic vascular disease is present.  There is considerable abnormal wall thickening and 3 long segment of the sigmoid colon, potentially from prominent inflammation or less likely tumor. Adjacent mesenteric edema noted.  Mild levoconvex lumbar scoliosis. Scattered small retroperitoneal lymph nodes are present.  IMPRESSION: 1. Acute nondisplaced transverse fracture through the vertebral body and posterior elements of L1 (Chance fracture), with associated paraspinal edema. 2. Markedly severe manifestations of ankylosing spondylitis with complete anterior and posterior fusion in the lumbar spine and complete fusion of the sacroiliac joints. 3. Severe wall thickening in the sigmoid colon with surrounding stranding, potentially from severe colitis or tumor. 4. Bilateral complex renal exophytic lesions, probably complex cysts although these are not characterized on today's exam with regard to malignant potential.   Electronically Signed   By: Sherryl Barters M.D.   On: 09/09/2013 17:44   Ir Fluoro Guide Cv Line Right  09/28/2013   CLINICAL DATA:  Non functional left arm AV dialysis fistula, not amenable to percutaneous intervention. Dialysis access needed.  EXAM: TUNNELED HEMODIALYSIS CATHETER PLACEMENT WITH ULTRASOUND AND  FLUOROSCOPIC GUIDANCE  TECHNIQUE: The procedure, risks, benefits, and alternatives were explained to the patient. Questions regarding the procedure were encouraged and answered. The patient understands and consents to the procedure. As antibiotic prophylaxis, cefazolin 1 g was ordered pre-procedure and administered intravenously within one hour of incision.Patency of the right IJ vein was confirmed with ultrasound with image documentation. An appropriate skin site was determined. Region was prepped using maximum barrier technique including cap and mask, sterile gown, sterile gloves, large sterile sheet, and Chlorhexidine as cutaneous antisepsis. The region was infiltrated locally with 1% lidocaine.  Intravenous Fentanyl and Versed were administered as conscious sedation during continuous cardiorespiratory monitoring by the radiology RN, with a total moderate sedation time of 80 minutes.  Under real-time ultrasound guidance, the right IJ vein was accessed with a 21 gauge micropuncture needle; the needle tip within the vein was confirmed with ultrasound image documentation. Needle exchanged over the 018 guidewire for transitional dilator, which allowed advancement of a Benson wire into the IVC. Over this, an MPA catheter was advanced. A Hemosplit 19 hemodialysis catheter was tunneled from the right anterior chest wall approach to the right IJ dermatotomy site. The MPA catheter was exchanged over an Amplatz wire for serial vascular dilators which allow placement of a peel-away sheath, through which the catheter was advanced under intermittent fluoroscopy, positioned with its tips in the proximal and midright atrium. Spot chest radiograph confirms good catheter position. No pneumothorax. Catheter was flushed and primed per protocol. Catheter secured externally with O Prolene sutures. The right IJ dermatotomy site was closed with 3-0 Monocryl subcutaneous suture and covered with Dermabond. No immediate complication.  COMPARISON:  None  FLUOROSCOPY TIME:  10 min 12 seconds  ACCESS: Remains approachable for percutaneous intervention as needed.  IMPRESSION: 1. Technically successful placement of tunneled right IJ hemodialysis catheter with ultrasound and fluoroscopic guidance. Ready for routine use.   Electronically Signed   By: Arne Cleveland M.D.   On: 09/28/2013 14:10   Ir Angio Av Shunt Addl Access  09/28/2013   CLINICAL DATA:  End stage renal disease, poor function of left arm dialysis fistula.  TECHNIQUE: DIALYSIS SHUNTOGRAM  The procedure, risks (including but not limited to bleeding, infection, organ damage ), benefits, and alternatives were explained to the patient. Questions regarding the procedure were encouraged and answered. The patient understands and consents to the procedure.  In preparation for possible declot procedure, the skin overlying the left forearm fistula was prepped with Betadine, draped in usual sterile fashion, infiltrated locally with 1% lidocaine.  Intravenous Fentanyl and Versed were administered as conscious sedation during continuous cardiorespiratory monitoring by the radiology RN, with a total moderate sedation time of eighty minutes.  The outflow vein from the fistula was accessed just central to the fistula with a 21 gauge micropuncture needle under real-time ultrasound guidance. Needle was exchanged over a 018 guide wire for a 4 French transitional dilator. This was used for outflow venography.  The outflow vein was then accessed more centrally retrograde with a 21 gauge micropuncture needle under real-time ultrasound guidance. The needle was exchanged over the 018 wire for the transitional dilator, which allow placement of a Bentson wire. Over this, a 5 [place] Kumpe catheter was advanced and used to negotiate the arterial anastomosis for additional fistulography. Attempts were made to catheterize the proximal radial artery using a variety of catheter & guidewire combinations but these  were ultimately unsuccessful. Assessment of the arterial anatomy of the forearm was performed with a blood pressure cuff at the upper arm level.  Ultimately, No declot procedure was needed. The access sites were removed and hemostasis achieved with manual compression.  No immediate complication.  COMPARISON:  None  FLUOROSCOPY TIME:  10 min 12 seconds  ACCESS: Proximal left radial artery occlusion. Not amenable to percutaneous intervention. Consider surgical consultation.  FINDINGS: The outflow cephalic vein from the fistula is widely patent. Central venous system through the SVC is patent. There is a competing outflow vein at the level of the midforearm. There is stenosis at the arterial anastomosis. The proximal radial artery is occluded, not opacified central to the level of the fistula. There is retrograde flow in the distal radial artery supplied by palmar arches, supplying low-grade flow across the fistula.  IMPRESSION: 1. Proximal left radial artery occlusion. Collateral flow via palmar arches reconstitutes the distal radial artery, with retrograde flow into the fistula, at an inadequate rate. This is not amenable to percutaneous intervention. After telephone consultation with Dr. Jonnie Finner, plan made to proceed with hemodialysis catheter placement.   Electronically Signed   By: Arne Cleveland M.D.   On: 09/28/2013 14:17   Ir US Guide Vasc Access Left  09/28/2013   CLINICAL DATA:  End stage renal disease, poor function of left arm dialysis fistula.  TECHNIQUE: DIALYSIS SHUNTOGRAM  The procedure, risks (including but not limited to bleeding, infection, organ damage ), benefits, and alternatives were explained to the patient. Questions regarding the procedure were encouraged and answered. The patient understands and consents to the procedure.  In preparation for possible declot procedure, the skin overlying the left forearm fistula was prepped with Betadine, draped in  usual sterile fashion, infiltrated  locally with 1% lidocaine.  Intravenous Fentanyl and Versed were administered as conscious sedation during continuous cardiorespiratory monitoring by the radiology RN, with a total moderate sedation time of eighty minutes.  The outflow vein from the fistula was accessed just central to the fistula with a 21 gauge micropuncture needle under real-time ultrasound guidance. Needle was exchanged over a 018 guide wire for a 4 French transitional dilator. This was used for outflow venography.  The outflow vein was then accessed more centrally retrograde with a 21 gauge micropuncture needle under real-time ultrasound guidance. The needle was exchanged over the 018 wire for the transitional dilator, which allow placement of a Bentson wire. Over this, a 5 [place] Kumpe catheter was advanced and used to negotiate the arterial anastomosis for additional fistulography. Attempts were made to catheterize the proximal radial artery using a variety of catheter & guidewire combinations but these were ultimately unsuccessful. Assessment of the arterial anatomy of the forearm was performed with a blood pressure cuff at the upper arm level.  Ultimately, No declot procedure was needed. The access sites were removed and hemostasis achieved with manual compression.  No immediate complication.  COMPARISON:  None  FLUOROSCOPY TIME:  10 min 12 seconds  ACCESS: Proximal left radial artery occlusion. Not amenable to percutaneous intervention. Consider surgical consultation.  FINDINGS: The outflow cephalic vein from the fistula is widely patent. Central venous system through the SVC is patent. There is a competing outflow vein at the level of the midforearm. There is stenosis at the arterial anastomosis. The proximal radial artery is occluded, not opacified central to the level of the fistula. There is retrograde flow in the distal radial artery supplied by palmar arches, supplying low-grade flow across the fistula.  IMPRESSION: 1. Proximal left  radial artery occlusion. Collateral flow via palmar arches reconstitutes the distal radial artery, with retrograde flow into the fistula, at an inadequate rate. This is not amenable to percutaneous intervention. After telephone consultation with Dr. Jonnie Finner, plan made to proceed with hemodialysis catheter placement.   Electronically Signed   By: Arne Cleveland M.D.   On: 09/28/2013 14:17   Ir US Guide Vasc Access Right  09/28/2013   CLINICAL DATA:  Non functional left arm AV dialysis fistula, not amenable to percutaneous intervention. Dialysis access needed.  EXAM: TUNNELED HEMODIALYSIS CATHETER PLACEMENT WITH ULTRASOUND AND FLUOROSCOPIC GUIDANCE  TECHNIQUE: The procedure, risks, benefits, and alternatives were explained to the patient. Questions regarding the procedure were encouraged and answered. The patient understands and consents to the procedure. As antibiotic prophylaxis, cefazolin 1 g was ordered pre-procedure and administered intravenously within one hour of incision.Patency of the right IJ vein was confirmed with ultrasound with image documentation. An appropriate skin site was determined. Region was prepped using maximum barrier technique including cap and mask, sterile gown, sterile gloves, large sterile sheet, and Chlorhexidine as cutaneous antisepsis. The region was infiltrated locally with 1% lidocaine.  Intravenous Fentanyl and Versed were administered as conscious sedation during continuous cardiorespiratory monitoring by the radiology RN, with a total moderate sedation time of 80 minutes.  Under real-time ultrasound guidance, the right IJ vein was accessed with a 21 gauge micropuncture needle; the needle tip within the vein was confirmed with ultrasound image documentation. Needle exchanged over the 018 guidewire for transitional dilator, which allowed advancement of a Benson wire into the IVC. Over this, an MPA catheter was advanced. A Hemosplit 19 hemodialysis catheter was tunneled from the  right anterior chest wall  approach to the right IJ dermatotomy site. The MPA catheter was exchanged over an Amplatz wire for serial vascular dilators which allow placement of a peel-away sheath, through which the catheter was advanced under intermittent fluoroscopy, positioned with its tips in the proximal and midright atrium. Spot chest radiograph confirms good catheter position. No pneumothorax. Catheter was flushed and primed per protocol. Catheter secured externally with O Prolene sutures. The right IJ dermatotomy site was closed with 3-0 Monocryl subcutaneous suture and covered with Dermabond. No immediate complication.  COMPARISON:  None  FLUOROSCOPY TIME:  10 min 12 seconds  ACCESS: Remains approachable for percutaneous intervention as needed.  IMPRESSION: 1. Technically successful placement of tunneled right IJ hemodialysis catheter with ultrasound and fluoroscopic guidance. Ready for routine use.   Electronically Signed   By: Arne Cleveland M.D.   On: 09/28/2013 14:10   Dg Chest Port 1 View  10/01/2013   CLINICAL DATA:  Atelectasis.  EXAM: PORTABLE CHEST - 1 VIEW  COMPARISON:  Chest radiograph 09/30/2013.  FINDINGS: Right-sided hemodialysis catheter unchanged. Left-sided central venous catheter tip projects over the superior cavoatrial junction. Interval extubation. NG tube tip and side-port project over the left upper quadrant. Stable cardiac and mediastinal contours. Small bilateral pleural effusions with underlying opacities likely representing atelectasis.  IMPRESSION: Marked gaseous distention of the visualized upper abdominal bowel loops, recommend correlation with dedicated abdominal radiography.  Interval extubation.  Otherwise stable support apparatus.  Small bilateral pleural effusions.  Underlying atelectasis.   Electronically Signed   By: Lovey Newcomer M.D.   On: 10/01/2013 08:08   Dg Chest Port 1 View  09/30/2013   CLINICAL DATA:  Central line placement  EXAM: PORTABLE CHEST - 1 VIEW   COMPARISON:  Air radiograph 09/29/2013  FINDINGS: There has been interval placement of a left IJ central venous catheter with tip overlying the cavoatrial junction. Tip of the endotracheal tube is positioned 3.6 cm above the carina. Right-sided hemodialysis catheter is unchanged.  Sequelae of prior CABG again noted.  Lungs are hypoinflated. Bilateral pleural effusions, left greater than right, are slightly improved relative to prior study. Associated bibasilar atelectasis is present. No new focal infiltrate. No overt pulmonary edema. No pneumothorax.  Osseous structures are unchanged.  IMPRESSION: 1. Tip of the left IJ central venous catheter overlying the cavoatrial junction. Remaining support apparatus as above. 2. Slight interval decrease in size of bilateral pleural effusions, left greater than right, with similar bibasilar atelectasis.   Electronically Signed   By: Jeannine Boga M.D.   On: 09/30/2013 03:23   Dg Chest Port 1 View  09/29/2013   CLINICAL DATA:  Status post right partial colectomy  EXAM: PORTABLE CHEST - 1 VIEW  COMPARISON:  02/17/2013  FINDINGS: Cardiac shadow remains mildly enlarged. A dialysis catheter is seen in satisfactory position. An endotracheal tube is now noted 3.8 cm above the carina. A nasogastric catheter extends into the stomach. Bilateral pleural effusions left greater than right are seen. The left effusion appears chronic in nature when compare with the previous exam from multiple months previous. No new focal abnormality is seen.  IMPRESSION: Tubes and lines as described.  Pleural effusions left greater than right. The left effusion appears chronic in nature.   Electronically Signed   By: Inez Catalina M.D.   On: 09/29/2013 18:00   Dg Abd 2 Views  09/28/2013   CLINICAL DATA:  Small bowel obstruction. Abdominal distention. NG tube placement.  EXAM: ABDOMEN - 2 VIEW  COMPARISON:  09/27/2013.  FINDINGS: Supine and left lateral decubitus views of the abdomen again  demonstrate diffuse dilatation of the small bowel loops in the central abdomen measuring up to 4.3 cm in diameter. Multiple air-fluid levels are noted on the left lateral decubitus view. No frank pneumoperitoneum. Paucity of distal colonic gas, although there is a small amount of rectal gas present. Nasogastric tube appears coiled in the stomach. 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  IMPRESSION: 1. Bowel gas pattern remains compatible with a small bowel obstruction. 2. No pneumoperitoneum at this time. 3. Nasogastric tube remains coiled in the stomach.   Electronically Signed   By: Vinnie Langton M.D.   On: 09/28/2013 10:38   Dg Abd 2 Views  09/27/2013   CLINICAL DATA:  Mid abdominal pain, back pain  EXAM: ABDOMEN - 2 VIEW  COMPARISON:  DG ABD PORTABLE 1V dated 09/26/2013  FINDINGS: Multiple dilated loops small bowel appreciated throughout the abdomen. Air-fluid levels identified. There is no evidence of free air. A moderate amount of stools appreciated region of the ascending colon. There is a paucity of distal bowel gas. The NG tube is seen with tip curled in the region of the stomach. When compared to previous study the dilated loops of small bowel have decreased in size and distribution.  IMPRESSION: Findings consistent with an improving small bowel obstruction. There is no evidence of free air.   Electronically Signed   By: Margaree Mackintosh M.D.   On: 09/27/2013 13:27   Dg Abd Portable 1v  10/02/2013   CLINICAL DATA:  Evaluate for resolution of ileus  EXAM: PORTABLE ABDOMEN - 1 VIEW  COMPARISON:  DG ABD PORTABLE 1V dated 10/01/2013; DG ABD PORTABLE 2V dated 09/29/2013; CT ABD/PELV WO CM dated 09/25/2013  FINDINGS: There is a paucity of bowel gas. There is persistent moderate gas distention of several mildly dilated loops of rather featureless appearing small bowel within the left mid hemi abdomen though there is decreased gaseous  distention of these bowel loops. A minimal amount of air is seen within the ascending and descending colon.  Nondiagnostic evaluation pneumoperitoneum. An enteric tube and side port overlie the expected location of the mid body of the stomach.  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 Post bilateral total hip replacements, incompletely imaged.  IMPRESSION: 1. Findings most suggestive of improving ileus, though note, there is persistent mild gas distention of several featureless loops of small bowel within the mid abdomen. Clinical correlation is advised. 2. Sequela of ankylosing spondylitis   Electronically Signed   By: Sandi Mariscal M.D.   On: 10/02/2013 08:21   Dg Abd Portable 1v  10/01/2013   CLINICAL DATA:  Distension.  Recent hemicolectomy with colostomy.  EXAM: PORTABLE ABDOMEN - 1 VIEW  COMPARISON:  09/29/2013 and 09/28/2013  FINDINGS: Central venous catheter unchanged with tip over the right atrium. Nasogastric tube is present with tip over the midline upper abdomen likely over the distal stomach. There is continued evidence of multiple air-filled dilated small bowel loops. There is minimal air within the colon. There is continued evidence of an unusual air collection in the right upper quadrant as cannot exclude free peritoneal air as seen on the prior study. Remainder of the exam is unchanged.  IMPRESSION: Persistent air-filled dilated small bowel loops with paucity of colonic gas. Findings suggesting small bowel obstructive process. Persistent unusual air collection over the right upper quadrant as this may represent free  peritoneal air as seen previously likely due to patient's recent hemicolectomy.  Nasogastric tube with tip over the mid abdomen likely in the distal stomach.  Findings discussed with patient's nurse, Morey Hummingbird, at the time of dictation.   Electronically Signed   By: Marin Olp M.D.   On: 10/01/2013 14:56   Dg Abd Portable 1v  09/26/2013    CLINICAL DATA:  Small bowel obstruction, abdominal pain and distention  EXAM: PORTABLE ABDOMEN - 1 VIEW  COMPARISON:  Prior abdominal radiograph 09/25/2013  FINDINGS: Persistent gaseous distension of the stomach and multiple loops of small bowel. The tip of the nasogastric tube projects over the gastric bubble, however the proximal side hole is in the region of the gastroesophageal junction. The degree of small-bowel distention is not significantly changed since the prior study. Maximal small bowel diameter is again 5 cm. No large free air on this single supine radiograph. Surgical changes of prior median sternotomy and bilateral total hip arthroplasties. The bones are osteopenic. Atherosclerotic vascular calcifications noted in the aorta.  IMPRESSION: 1. No significant interval change in the appearance of high-grade small bowel obstruction. Maximal small bowel diameter is again 5 cm. 2. The proximal side port of the nasogastric tube is in the GE junction. Consider advancing 5 cm for more optimal placement.   Electronically Signed   By: Jacqulynn Cadet M.D.   On: 09/26/2013 07:35   Dg Abd Portable 1v  09/25/2013   CLINICAL DATA:  Nasogastric tube placement  EXAM: PORTABLE ABDOMEN - 1 VIEW  COMPARISON:  CT abdomen September 25, 2013  FINDINGS: Nasogastric tube is identified with distal tip in the mid stomach. The stomach is air-filled and distended. There are multiple dilated small bowel loops.  IMPRESSION: Naso gastric tube distal tip in the mid stomach. Small bowel obstruction.   Electronically Signed   By: Abelardo Diesel M.D.   On: 09/25/2013 22:56   Dg Abd Portable 2v  10/06/2013   CLINICAL DATA:  Severe abdominal distention and pain. Postop from right hemicolectomy on 09/29/2013.  EXAM: PORTABLE ABDOMEN - 2 VIEW  COMPARISON:  10/02/2013  FINDINGS: Increased dilatation of small bowel loops seen. A moderate amount of free intraperitoneal air is also demonstrated and appears increased since previous study.  Previous seen nasogastric tube is no longer visualized.  IMPRESSION: Increased small bowel dilatation and free intraperitoneal air since prior study, highly suspicious for anastomotic leak or perforated viscus.  Critical Value/emergent results were called by telephone at the time of interpretation on 10/06/2013 at 2:01 PM to the patient's floor nurse Jonni Sanger, who verbally acknowledged these results.   Electronically Signed   By: Earle Gell M.D.   On: 10/06/2013 14:04   Dg Abd Portable 2v  09/29/2013   CLINICAL DATA:  Status post colonoscopy. Question free intraperitoneal air.  EXAM: PORTABLE ABDOMEN - 2 VIEW  COMPARISON:  CT abdomen and pelvis 09/25/2013.  FINDINGS: There is a large volume of free intraperitoneal air. NG tube is in place with tip in the stomach.  IMPRESSION: Study is positive for free intraperitoneal air.  Critical Value/emergent results were called by telephone at the time of interpretation on 09/29/2013 at 12:27 PM to Dr. Ronald Lobo , who verbally acknowledged these results.   Electronically Signed   By: Inge Rise M.D.   On: 09/29/2013 12:30   Ir Shuntogram/ Fistulagram Left Mod Sed  09/28/2013   CLINICAL DATA:  End stage renal disease, poor function of left arm dialysis fistula.  TECHNIQUE: DIALYSIS SHUNTOGRAM  The procedure, risks (including but not limited to bleeding, infection, organ damage ), benefits, and alternatives were explained to the patient. Questions regarding the procedure were encouraged and answered. The patient understands and consents to the procedure.  In preparation for possible declot procedure, the skin overlying the left forearm fistula was prepped with Betadine, draped in usual sterile fashion, infiltrated locally with 1% lidocaine.  Intravenous Fentanyl and Versed were administered as conscious sedation during continuous cardiorespiratory monitoring by the radiology RN, with a total moderate sedation time of eighty minutes.  The outflow vein from the fistula  was accessed just central to the fistula with a 21 gauge micropuncture needle under real-time ultrasound guidance. Needle was exchanged over a 018 guide wire for a 4 French transitional dilator. This was used for outflow venography.  The outflow vein was then accessed more centrally retrograde with a 21 gauge micropuncture needle under real-time ultrasound guidance. The needle was exchanged over the 018 wire for the transitional dilator, which allow placement of a Bentson wire. Over this, a 5 [place] Kumpe catheter was advanced and used to negotiate the arterial anastomosis for additional fistulography. Attempts were made to catheterize the proximal radial artery using a variety of catheter & guidewire combinations but these were ultimately unsuccessful. Assessment of the arterial anatomy of the forearm was performed with a blood pressure cuff at the upper arm level.  Ultimately, No declot procedure was needed. The access sites were removed and hemostasis achieved with manual compression.  No immediate complication.  COMPARISON:  None  FLUOROSCOPY TIME:  10 min 12 seconds  ACCESS: Proximal left radial artery occlusion. Not amenable to percutaneous intervention. Consider surgical consultation.  FINDINGS: The outflow cephalic vein from the fistula is widely patent. Central venous system through the SVC is patent. There is a competing outflow vein at the level of the midforearm. There is stenosis at the arterial anastomosis. The proximal radial artery is occluded, not opacified central to the level of the fistula. There is retrograde flow in the distal radial artery supplied by palmar arches, supplying low-grade flow across the fistula.  IMPRESSION: 1. Proximal left radial artery occlusion. Collateral flow via palmar arches reconstitutes the distal radial artery, with retrograde flow into the fistula, at an inadequate rate. This is not amenable to percutaneous intervention. After telephone consultation with Dr. Jonnie Finner,  plan made to proceed with hemodialysis catheter placement.   Electronically Signed   By: Arne Cleveland M.D.   On: 09/28/2013 14:17         Subjective: Patient states the pain is a little better controlled but continues to have some back pain. Denies any fevers, chills, chest discomfort, nausea, vomiting, diarrhea, shortness of breath. Continues to have lower back pain and abdominal pain.  Objective: Filed Vitals:   10/09/13 0537 10/09/13 0920 10/09/13 1003 10/09/13 1416  BP: 90/63  101/62 105/62  Pulse: 76  93 76  Temp: 97.9 F (36.6 C)  97.7 F (36.5 C) 98.6 F (37 C)  TempSrc: Oral  Oral Oral  Resp: 16  17 18   Height:      Weight:  68.5 kg (151 lb 0.2 oz)    SpO2: 96%  95% 100%    Intake/Output Summary (Last 24 hours) at 10/09/13 1516 Last data filed at 10/09/13 0900  Gross per 24 hour  Intake    360 ml  Output      0 ml  Net    360 ml   Weight change:  Exam:  General:  Pt is alert, follows commands appropriately, not in acute distress  HEENT: No icterus, No thrush,  [place]/AT  Cardiovascular: RRR, S1/S2, no rubs, no gallops  Respiratory: Bibasilar crackles. No wheezing. Good air movement.  Abdomen: Soft/+BS, nondistended is without peritoneal sign. non distended, no guarding  Extremities: No edema, No lymphangitis, No petechiae, No rashes, no synovitis  Data Reviewed: Basic Metabolic Panel:  Recent Labs Lab 10/03/13 0400 10/04/13 1030 10/05/13 0540 10/07/13 0658  NA 137  --  141 138  K 3.4* 3.9 3.8 4.2  CL 99  --  104 99  CO2 22  --  26 28  GLUCOSE 94  --  106* 70  BUN 21  --  16 14  CREATININE 2.92*  --  2.57* 2.44*  CALCIUM 7.3*  --  7.5* 7.6*  PHOS 4.0  --  3.2 3.3   Liver Function Tests:  Recent Labs Lab 10/03/13 0400 10/05/13 0540 10/07/13 0658  ALBUMIN 1.5* 1.4* 1.5*   No results found for this basename: LIPASE, AMYLASE,  in the last 168 hours No results found for this basename: AMMONIA,  in the last 168 hours CBC:  Recent  Labs Lab 10/03/13 0400 10/05/13 0540 10/06/13 0525 10/07/13 0658 10/08/13 0405  WBC 15.8* 12.7* 14.3* 14.6* 11.6*  HGB 8.7* 9.1* 9.2* 9.6* 9.7*  HCT 27.5* 28.9* 29.4* 30.6* 31.6*  MCV 95.2 96.0 96.7 97.1 97.8  PLT 265 329 363 411* 360   Cardiac Enzymes: No results found for this basename: CKTOTAL, CKMB, CKMBINDEX, TROPONINI,  in the last 168 hours BNP: No components found with this basename: POCBNP,  CBG:  Recent Labs Lab 10/07/13 1707 10/08/13 0729 10/08/13 2119 10/09/13 0813 10/09/13 1137  GLUCAP 138* 73 93 94 131*    Recent Results (from the past 240 hour(s))  CLOSTRIDIUM DIFFICILE BY PCR     Status: None   Collection Time    10/04/13 10:17 AM      Result Value Ref Range Status   C difficile by pcr NEGATIVE  NEGATIVE Final     Scheduled Meds: . acetaminophen  1,000 mg Oral TID  . amiodarone  200 mg Oral Daily  . antiseptic oral rinse  1 application Mouth Rinse QID  . calcitonin (salmon)  1 spray Alternating Nares Daily  . chlorhexidine  15 mL Mouth/Throat BID  . [START ON 10/10/2013] darbepoetin  150 mcg Intravenous Q Mon-HD  . doxercalciferol  2 mcg Intravenous Q M,W,F-HD  . feeding supplement (NEPRO CARB STEADY)  237 mL Oral Q24H  . feeding supplement (RESOURCE BREEZE)  1 Container Oral BID BM  . ferric gluconate (FERRLECIT/NULECIT) IV  62.5 mg Intravenous Q Fri-HD  . gabapentin  300 mg Oral Daily  . hydrALAZINE  10 mg Oral Once  . lidocaine  1 patch Transdermal Q24H  . methocarbamol  750 mg Oral QID  . midodrine  10 mg Oral BID WC  . multivitamin  1 tablet Oral QHS  . OxyCODONE  20 mg Oral Q12H  . pantoprazole  40 mg Oral Q1200  . psyllium  1 packet Oral BID  . traMADol  50 mg Oral Q12H   Continuous Infusions: . sodium chloride Stopped (10/03/13 1300)     Rashaun Curl, DO  [facility] Pager [phone]  If 7PM-7AM, please contact night-coverage [email or web address] Password [facility] 10/09/2013, 3:16 PM   LOS: 14 days

## 2013-10-09 NOTE — Consult Note (Addendum)
WOC wound consult note Reason for Consult: Staff called to day regarding midline abdominal wound, with leak of peritoneal fluid.  Dr. Donne Hazel requests NPWT at 75 mmHg continuous negative pressure to start today.  Staff report that patient has been wet all night and that dressing have been changed several times today already.  Ostomy appliance is wet. Wound type:Surgical Pressure Ulcer POA: No Measurement:14 x 3.5 x 3cm. Wound GUR:KYHCW gray adherent tissue in wound bed with blue suture evident.  Wound sides are red, moist. Drainage (amount, consistency, odor) Large amounts of serous fluid. Periwound:Intact Dressing procedure/placement/frequency:NPWT initiated per Dr. Cristal Generous request.  This will help to quantify drainage and may enhance wound healing.  A return to the OR is not out of the question, but [place] team is happy to assist in the management of this patient as needed. Note:  RN assisting with NPWT Dressing placement Cabin crew, Marjorie Smolder) is asked to monitor output and to contact [facility] MD on call if it becomes of such a quantity that she or the patient's RN is concerned.  WOC ostomy consult note Stoma type/location: RLQ ileostomy Stomal assessment/size: 1 and 1/8 inch with mucocutaneous separation from 11-3 o'clock  Peristomal assessment: Intact, wet form leaking abdominal wound. Treatment options for stomal/peristomal skin: peristomal powder applied to mucocutaneous separation prior to repouching. Skin barrier cut to fit size of stoma. Output 132ml of brown effluent. Ostomy pouching: 2pc. , 2 and 1/4 inch pouching system.  [place] nursing team will follow, and will remain available to this patient, the nursing, surgical and medical teams.  Please re-consult if needed. Thanks, Maudie Flakes, MSN, RN, [place], [place], [place] [phone])

## 2013-10-09 NOTE — Progress Notes (Signed)
Patient with 250cc of light brown clear drainage from wound vac so far. Dr Donne Hazel notified. Will continue to monitor.

## 2013-10-09 NOTE — Progress Notes (Signed)
10 Days Post-Op  Subjective: Leakage from midline wound otherwise ok.  Some abd pain  Objective: Vital signs in last 24 hours: Temp:  [97.7 F (36.5 C)-98.6 F (37 C)] 97.7 F (36.5 C) (02/22 1003) Pulse Rate:  [74-93] 93 (02/22 1003) Resp:  [16-18] 17 (02/22 1003) BP: (90-125)/(62-79) 101/62 mmHg (02/22 1003) SpO2:  [95 %-99 %] 95 % (02/22 1003) Weight:  [151 lb 0.2 oz (68.5 kg)] 151 lb 0.2 oz (68.5 kg) (02/22 0920) Last BM Date: 10/09/13  Intake/Output from previous day: 02/21 0701 - 02/22 0700 In: 840 [P.O.:840] Out: -  Intake/Output this shift: Total I/O In: 120 [P.O.:120] Out: -   GI: wound is intact but leaking peritoneal fluid, there is some fat necrosis, ileostomy pink and functional  Lab Results:   Recent Labs  10/07/13 0658 10/08/13 0405  WBC 14.6* 11.6*  HGB 9.6* 9.7*  HCT 30.6* 31.6*  PLT 411* 360   BMET  Recent Labs  10/07/13 0658  NA 138  K 4.2  CL 99  CO2 28  GLUCOSE 70  BUN 14  CREATININE 2.44*  CALCIUM 7.6*   PT/INR No results found for this basename: LABPROT, INR,  in the last 72 hours ABG No results found for this basename: PHART, PCO2, PO2, HCO3,  in the last 72 hours  Studies/Results: No results found.  Anti-infectives: Anti-infectives   Start     Dose/Rate Route Frequency Ordered Stop   09/30/13 1400  ertapenem (INVANZ) 0.5 g in sodium chloride 0.9 % 50 mL IVPB  Status:  Discontinued     500 mg 100 mL/hr over 30 Minutes Intravenous Every 24 hours 09/29/13 2202 10/07/13 1106   09/29/13 1415  ertapenem (INVANZ) 1 g in sodium chloride 0.9 % 50 mL IVPB     1 g 100 mL/hr over 30 Minutes Intravenous  Once 09/29/13 1159 09/29/13 1412   09/29/13 0600  cefUROXime (ZINACEF) 1.5 g in dextrose 5 % 50 mL IVPB  Status:  Discontinued     1.5 g 100 mL/hr over 30 Minutes Intravenous On call to O.R. 09/28/13 1711 09/29/13 2203   09/28/13 1200  ceFAZolin (ANCEF) IVPB 2 g/50 mL premix    Comments:  Tube to 17   2 g 100 mL/hr over 30  Minutes Intravenous  Once 09/28/13 1155 09/28/13 1246   09/27/13 1200  vancomycin (VANCOCIN) IVPB 1000 mg/200 mL premix  Status:  Discontinued     1,000 mg 200 mL/hr over 60 Minutes Intravenous Every M-W-F (Hemodialysis) 09/26/13 1638 09/26/13 1721   09/26/13 1745  metroNIDAZOLE (FLAGYL) IVPB 500 mg  Status:  Discontinued     500 mg 100 mL/hr over 60 Minutes Intravenous Every 8 hours 09/26/13 1722 10/03/13 1112   09/26/13 1200  vancomycin (VANCOCIN) IVPB 750 mg/150 ml premix  Status:  Discontinued     750 mg 150 mL/hr over 60 Minutes Intravenous Every M-W-F (Hemodialysis) 09/25/13 2344 09/26/13 1051   09/26/13 1200  vancomycin (VANCOCIN) IVPB 1000 mg/200 mL premix  Status:  Discontinued     1,000 mg 200 mL/hr over 60 Minutes Intravenous Every M-W-F (Hemodialysis) 09/26/13 1052 09/26/13 1638   09/26/13 0600  piperacillin-tazobactam (ZOSYN) IVPB 2.25 g  Status:  Discontinued     2.25 g 100 mL/hr over 30 Minutes Intravenous 3 times per day 09/25/13 2126 09/26/13 1721   09/26/13 0000  vancomycin (VANCOCIN) 500 mg in sodium chloride irrigation 0.9 % 100 mL ENEMA  Status:  Discontinued     500 mg Rectal 4  times per day 09/25/13 2337 09/26/13 1722   09/25/13 2130  vancomycin (VANCOCIN) IVPB 750 mg/150 ml premix     750 mg 150 mL/hr over 60 Minutes Intravenous  Once 09/25/13 2124 09/26/13 0145   09/25/13 2000  piperacillin-tazobactam (ZOSYN) IVPB 3.375 g     3.375 g 100 mL/hr over 30 Minutes Intravenous  Once 09/25/13 1853 09/25/13 2002   09/25/13 2000  vancomycin (VANCOCIN) IVPB 1000 mg/200 mL premix     1,000 mg 200 mL/hr over 60 Minutes Intravenous  Once 09/25/13 1859 09/25/13 2113      Assessment/Plan: S/p right colectomy and ileostomy  He has small wound dehiscence that I cannot identify where but is leaking. One option would be to return to or but I think reasonable to try conservatively treating this right now with wound vac and abd binder.  He may very well end up needing to go back  to or but that would only be to prevent evisceration which I think he is closed well enough right now for that.  [facility] 10/09/2013

## 2013-10-10 ENCOUNTER — Encounter: Payer: Self-pay | Admitting: Vascular Surgery

## 2013-10-10 LAB — GLUCOSE, CAPILLARY
GLUCOSE-CAPILLARY: 130 mg/dL — AB (ref 70–99)
GLUCOSE-CAPILLARY: 114 mg/dL — AB (ref 70–99)
GLUCOSE-CAPILLARY: 88 mg/dL (ref 70–99)
GLUCOSE-CAPILLARY: 76 mg/dL (ref 70–99)

## 2013-10-10 LAB — RENAL FUNCTION PANEL
ALBUMIN: 1.4 g/dL — AB (ref 3.5–5.2)
BUN: 19 mg/dL (ref 6–23)
CHLORIDE: 99 meq/L (ref 96–112)
CO2: 23 mEq/L (ref 19–32)
Calcium: 7.5 mg/dL — ABNORMAL LOW (ref 8.4–10.5)
Creatinine, Ser: 3.51 mg/dL — ABNORMAL HIGH (ref 0.50–1.35)
GFR calc Af Amer: 19 mL/min — ABNORMAL LOW (ref >90)
GFR, EST NON AFRICAN AMERICAN: 16 mL/min — AB (ref >90)
Glucose, Bld: 114 mg/dL — ABNORMAL HIGH (ref 70–99)
Phosphorus: 3.1 mg/dL (ref 2.3–4.6)
Potassium: 4.4 mEq/L (ref 3.7–5.3)
Sodium: 136 mEq/L — ABNORMAL LOW (ref 137–147)

## 2013-10-10 LAB — CBC
HEMATOCRIT: 32.1 % — AB (ref 39.0–52.0)
Hemoglobin: 10 g/dL — ABNORMAL LOW (ref 13.0–17.0)
MCH: 30.7 pg (ref 26.0–34.0)
MCHC: 31.2 g/dL (ref 30.0–36.0)
MCV: 98.5 fL (ref 78.0–100.0)
PLATELETS: 389 10*3/uL (ref 150–400)
RBC: 3.26 MIL/uL — ABNORMAL LOW (ref 4.22–5.81)
RDW: 22.2 % — AB (ref 11.5–15.5)
WBC: 20.2 10*3/uL — AB (ref 4.0–10.5)

## 2013-10-10 MED ORDER — METHOCARBAMOL 500 MG PO TABS: 750.0000 mg | ORAL_TABLET | Freq: Four times a day (QID) | ORAL | Status: DC | PRN

## 2013-10-10 MED ORDER — DOXERCALCIFEROL 4 MCG/2ML IV SOLN
INTRAVENOUS | Status: AC
  Administered 2013-10-10: 2 ug via INTRAVENOUS
  Filled 2013-10-10: qty 2

## 2013-10-10 MED ORDER — DARBEPOETIN ALFA-POLYSORBATE 150 MCG/0.3ML IJ SOLN
INTRAMUSCULAR | Status: AC
  Administered 2013-10-10: 150 ug
  Filled 2013-10-10: qty 0.3

## 2013-10-10 NOTE — Progress Notes (Addendum)
NUTRITION FOLLOW UP  DOCUMENTATION CODES Per approved criteria  -Severe malnutrition in the context of chronic illness   INTERVENTION: Continue Nepro Shake po daily and Resource Breeze po BID. Agree with Regular diet. Encouraged intake. RD to continue to follow nutrition care plan.  NUTRITION DIAGNOSIS: Inadequate oral intake now related to altered GI function as evidenced by limited PO intake. Ongoing.  Goal: Pt to meet >/= 90% of their estimated nutrition needs, unmet.  Monitor:  PO intake, weight, labs, I/O's, supplement tolerance  ASSESSMENT: PMHx is significant for ESRD (MWF), CAD s/p CABG, DM, HTN, afib, COPD. Recently discharged from [facility] about 2 weeks ago 2/2 L1 compression fx was discharged to SNF. Pt was also dx with C diff during that time. Admitted now with constipation x 3-4 days, also with n/v every time he tries to eat. Work-up reveals ?c diff colitis and HCAP.  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 Plan to take down VAC in OR tomorrow with possible need for re-exploration and closure of his abdominal wall.  Changed to Regular diet on 2/22. Pt is excited about his diet change. He is asking for [facility]. Currently in HD. Encouraged pt to eat well for dinner, and if he doesn't like his dinner to order an alternative. Pt verbalized understanding. Pt reports that he will take Nepro and Breeze.  Potassium and phosphorus now WNL.  Height: Ht Readings from Last 1 Encounters:  09/27/13 5\' 6"  (1.676 m)    Weight: Wt Readings from Last 1 Encounters:  10/09/13 151 lb 2 oz (68.55 kg)  Admit wt 163 lb  Estimated Nutritional Needs: Kcal: 2000-2200 Protein: 110-120  gm Fluid: 1.2 liters  Skin:  stage II pressure ulcer to sacrum Abdominal incision (closed) + wound VAC Neck HD catheter site (open) RLQ ileostomy  Diet Order: Regular diet    Intake/Output Summary (Last 24 hours) at 10/10/13 1350 Last data filed at 10/10/13 1300  Gross per 24 hour  Intake    390 ml  Output    625 ml  Net   -235 ml    Last BM: 2/23 via ostomy - 250 ml so far today  Labs:   Recent Labs Lab 10/04/13 1030 10/05/13 0540 10/07/13 0658  NA  --  141 138  K 3.9 3.8 4.2  CL  --  104 99  CO2  --  26 28  BUN  --  16 14  CREATININE  --  2.57* 2.44*  CALCIUM  --  7.5* 7.6*  PHOS  --  3.2 3.3  GLUCOSE  --  106* 70    CBG (last 3)   Recent Labs  10/09/13 2055 10/10/13 0734 10/10/13 1149  GLUCAP 263* 130* 114*    Scheduled Meds: . acetaminophen  1,000 mg Oral TID  . amiodarone  200 mg Oral Daily  . antiseptic oral rinse  1 application Mouth Rinse QID  . calcitonin (salmon)  1 spray Alternating Nares Daily  . chlorhexidine  15 mL Mouth/Throat BID  . darbepoetin  150 mcg Intravenous Q Mon-HD  . doxercalciferol  2 mcg Intravenous Q M,W,F-HD  . feeding supplement (NEPRO CARB STEADY)  237 mL Oral Q24H  . feeding supplement (RESOURCE BREEZE)  1  Container Oral BID BM  . ferric gluconate (FERRLECIT/NULECIT) IV  62.5 mg Intravenous Q Fri-HD  . gabapentin  300 mg Oral Daily  . hydrALAZINE  10 mg Oral Once  . lidocaine  1 patch Transdermal Q24H  . midodrine  10 mg Oral BID WC  . multivitamin  1 tablet Oral QHS  . OxyCODONE  20 mg Oral Q12H  . pantoprazole  40 mg Oral Q1200  . psyllium  1 packet Oral BID  . traMADol  50 mg Oral Q12H    Continuous Infusions: . sodium chloride Stopped (10/03/13 1300)    Inda Coke MS, RD, LDN Inpatient Registered Dietitian Pager: [phone] After-hours pager: [phone]

## 2013-10-10 NOTE — Progress Notes (Signed)
PT Cancellation Note  Patient Details Name: Nicholas [name] MRN: [id number] DOB: 05/17/1941   Cancelled Treatment:    Reason Eval/Treat Not Completed: Patient at procedure or test/unavailable; patient in hemodialysis currently.  Will check back tomorrow.   WYNN,CYNDI 10/10/2013, 3:26 PM

## 2013-10-10 NOTE — Progress Notes (Signed)
[facility] PROGRESS NOTE  ARCENIO BHOLA [id number] DOB: 09/12/1940 DOA: 09/25/2013 PCP: [facility]  Interim history 73 yo white male with end-stage renal disease status post exploratory laparotomy for small bowel obstruction 09/29/13. 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 The patient with right colectomy and repair of descending and sigmoid serosal tears and ileostomy placement. The patient was on vasopressor support and postop mechanical ventilation and critical care was consulted. He was extubated on 09/30/2013.  [facility] assumed care on 10/04/13.  The patient continued to struggle with postoperative pain as well as pain from his L1 vertebral fracture. Addition of OxyContin and Robaxin has helped. The patient finished IV ertapenem for his peritonitis from bowel perforation. The patient experienced problems with abdominal wound dehiscence and was subsequently taken back to surgery on 10/11/2013.  Assessment/Plan: SBO  -Secondary to mass at ileocecal valve with right colon perforation  -Status post ex lap, R-colectomy, repair of descending and sigmoid serosal tears and ileostomy--Dr. 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  Right colon perforation  -Repeat abdominal x-ray 10/06/13 showed increased free air  -10/06/2013 CT abdomen and pelvis shows moderate left effusion with atelectasis with right basilar opacity  -appreciate surgery followup-->plans noted for wound VAC  -continue soft diet  -mobilize  -Patient finished 7 days of ertapenem 10/06/13  Abdominal Wound Dehiscence -wound VAC placed 10/09/13 -large amount of thin serous drainage from [facility] - concerning for fascial dehiscence -Take down VAC in am 10/11/13  with possible need for reexploration and closure of his abdominal wall Right basilar opacity  -Doubt pneumonia  -No fevers or respiratory  distress or shortness of breath  -Patient finished 7 days of ertapenem 10/06/13  L1 vertebral fx/Ankylosing spondylitis/uncontrolled pain  -secondary to fall, managed non-operatively  -Seems to have some improvement with Robaxin  -Added long acting opioid--> increase OxyContin 20 mg every 12 hours-->pain improving  Adenocarcinoma of colon  -spoke with [facility]--Dr. 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  -f/u CBC-->Hgb stable  -transfuse for Hb < 7  -SCD for DVT prevention  Family Communication: Pt at beside  Disposition Plan: SNF when stable       Procedures/Studies: Ct Abdomen Pelvis Wo Contrast  10/06/2013   CLINICAL DATA:  Status post colectomy for colon cancer.  EXAM: CT ABDOMEN AND PELVIS WITHOUT CONTRAST  TECHNIQUE: Multidetector CT imaging of the abdomen and pelvis was performed following the standard protocol without intravenous contrast.  COMPARISON:  CT scan of September 25, 2013.  FINDINGS: L1 compression fracture is again noted. Moderate left pleural effusion is noted with left lower lobe atelectasis. Status post bilateral hip arthroplasties. Mild right basilar opacity is  noted concerning for pneumonia or atelectasis with associated  pleural effusion.  No focal abnormality is seen in the liver, spleen or pancreas on these unenhanced images. Dense sludge is noted in the gallbladder. Fluid is noted around the liver and spleen, as well as in the pelvis consistent with ascites. Colostomy is noted in the right lower quadrant and patient is status post right colectomy. Mild pneumoperitoneum is noted in the epigastric region. Midline surgical incision is noted. Small bowel dilatation is noted which may represent postoperative ileus. Bilateral renal atrophy is noted. Stable rounded and exophytic hyperdense and hypodense abnormalities are seen involving both kidneys most likely representing cysts. Renal ultrasound may be performed for further evaluation. Evaluation of the pelvis is limited due to scatter artifact from previously described bilateral hip arthroplasties. Atherosclerotic calcifications of abdominal aorta are noted without aneurysm formation. Adrenal glands appear normal.  IMPRESSION: L1 compression fracture is again noted.  Moderate left pleural effusion is noted with left lower lobe atelectasis. Mild right basilar opacity is noted concerning for pneumonia or atelectasis.  Mild ascites is noted.  Status post right hemicolectomy with colostomy seen in right lower quadrant. Mildly dilated small bowel loops are noted most consistent with postoperative ileus, but distal obstruction cannot be excluded.  Stable rounded and exophytic hyperdense and hypodense abnormalities are seen involving both kidneys which may represent cysts; renal ultrasound is recommended for further evaluation.  Bilateral renal atrophy is noted.  Mild pneumoperitoneum is noted in the epigastric region most likely postoperative in nature.   Electronically Signed   By: Sabino Dick M.D.   On: 10/06/2013 21:16   Ct Abdomen Pelvis Wo Contrast  09/25/2013   CLINICAL DATA:  Vomiting, abdominal distention, constipation  EXAM: CT ABDOMEN AND PELVIS WITHOUT CONTRAST  TECHNIQUE:  Multidetector CT imaging of the abdomen and pelvis was performed following the standard protocol without intravenous contrast.  COMPARISON:  None.  FINDINGS: There are multiple dilated small bowel loops throughout the abdomen and pelvis. In the right mid abdomen, there is a small bowel loop with the extensive bowel content and crecent air anteriorly; this is more likely due to air surrounding stool and less likely due to pneumatosis. There is bowel wall thickening in decompressed ascending, transverse and descending colon.  The liver, spleen, pancreas are normal. The gallbladder is distended. The adrenal glands are normal. The kidneys are atrophic. There are nonobstructing left kidney stones. There are small high and low density lesions in both kidneys, evaluation is limited without contrast. There is atherosclerosis of the abdominal aorta without aneurysmal dilatation. There is no abdominal lymphadenopathy.  Evaluation is decompressed bladder is limited by metallic artifact from the bilateral hip replacements. There is a small left pleural effusion. There is compression atelectasis of the bilateral posterior lung bases. There are patchy airspace consolidation the of the posterior right upper lobe suspicious for pneumonia.  IMPRESSION: Findings consistent with small bowel obstruction. In the right mid abdomen, there is a small bowel loop with the extensive bowel content and crecent air anteriorly; this is more likely due to air surrounding stool and less likely due to pneumatosis. Clinical correlation with lactose level is recommended.  Colonic bowel wall thickening of ascending, transverse, and descending colon. This is in part due to decompressed colon. However, colitis or ischemic colon are not excluded on this noncontrast exam.  Right upper lobe pneumonia.   Electronically Signed   By: Abelardo Diesel M.D.   On: 09/25/2013 17:56   Ir Fluoro Guide Cv Line  Right  09/28/2013   CLINICAL DATA:  Non functional left  arm AV dialysis fistula, not amenable to percutaneous intervention. Dialysis access needed.  EXAM: TUNNELED HEMODIALYSIS CATHETER PLACEMENT WITH ULTRASOUND AND FLUOROSCOPIC GUIDANCE  TECHNIQUE: The procedure, risks, benefits, and alternatives were explained to the patient. Questions regarding the procedure were encouraged and answered. The patient understands and consents to the procedure. As antibiotic prophylaxis, cefazolin 1 g was ordered pre-procedure and administered intravenously within one hour of incision.Patency of the right IJ vein was confirmed with ultrasound with image documentation. An appropriate skin site was determined. Region was prepped using maximum barrier technique including cap and mask, sterile gown, sterile gloves, large sterile sheet, and Chlorhexidine as cutaneous antisepsis. The region was infiltrated locally with 1% lidocaine.  Intravenous Fentanyl and Versed were administered as conscious sedation during continuous cardiorespiratory monitoring by the radiology RN, with a total moderate sedation time of 80 minutes.  Under real-time ultrasound guidance, the right IJ vein was accessed with a 21 gauge micropuncture needle; the needle tip within the vein was confirmed with ultrasound image documentation. Needle exchanged over the 018 guidewire for transitional dilator, which allowed advancement of a Benson wire into the IVC. Over this, an MPA catheter was advanced. A Hemosplit 19 hemodialysis catheter was tunneled from the right anterior chest wall approach to the right IJ dermatotomy site. The MPA catheter was exchanged over an Amplatz wire for serial vascular dilators which allow placement of a peel-away sheath, through which the catheter was advanced under intermittent fluoroscopy, positioned with its tips in the proximal and midright atrium. Spot chest radiograph confirms good catheter position. No pneumothorax. Catheter was flushed and primed per protocol. Catheter secured externally  with O Prolene sutures. The right IJ dermatotomy site was closed with 3-0 Monocryl subcutaneous suture and covered with Dermabond. No immediate complication.  COMPARISON:  None  FLUOROSCOPY TIME:  10 min 12 seconds  ACCESS: Remains approachable for percutaneous intervention as needed.  IMPRESSION: 1. Technically successful placement of tunneled right IJ hemodialysis catheter with ultrasound and fluoroscopic guidance. Ready for routine use.   Electronically Signed   By: Arne Cleveland M.D.   On: 09/28/2013 14:10   Ir Angio Av Shunt Addl Access  09/28/2013   CLINICAL DATA:  End stage renal disease, poor function of left arm dialysis fistula.  TECHNIQUE: DIALYSIS SHUNTOGRAM  The procedure, risks (including but not limited to bleeding, infection, organ damage ), benefits, and alternatives were explained to the patient. Questions regarding the procedure were encouraged and answered. The patient understands and consents to the procedure.  In preparation for possible declot procedure, the skin overlying the left forearm fistula was prepped with Betadine, draped in usual sterile fashion, infiltrated locally with 1% lidocaine.  Intravenous Fentanyl and Versed were administered as conscious sedation during continuous cardiorespiratory monitoring by the radiology RN, with a total moderate sedation time of eighty minutes.  The outflow vein from the fistula was accessed just central to the fistula with a 21 gauge micropuncture needle under real-time ultrasound guidance. Needle was exchanged over a 018 guide wire for a 4 French transitional dilator. This was used for outflow venography.  The outflow vein was then accessed more centrally retrograde with a 21 gauge micropuncture needle under real-time ultrasound guidance. The needle was exchanged over the 018 wire for the transitional dilator, which allow placement of a Bentson wire. Over this, a 5 [place] Kumpe catheter was advanced and used to negotiate the arterial  anastomosis for additional fistulography. Attempts were  made to catheterize the proximal radial artery using a variety of catheter & guidewire combinations but these were ultimately unsuccessful. Assessment of the arterial anatomy of the forearm was performed with a blood pressure cuff at the upper arm level.  Ultimately, No declot procedure was needed. The access sites were removed and hemostasis achieved with manual compression.  No immediate complication.  COMPARISON:  None  FLUOROSCOPY TIME:  10 min 12 seconds  ACCESS: Proximal left radial artery occlusion. Not amenable to percutaneous intervention. Consider surgical consultation.  FINDINGS: The outflow cephalic vein from the fistula is widely patent. Central venous system through the SVC is patent. There is a competing outflow vein at the level of the midforearm. There is stenosis at the arterial anastomosis. The proximal radial artery is occluded, not opacified central to the level of the fistula. There is retrograde flow in the distal radial artery supplied by palmar arches, supplying low-grade flow across the fistula.  IMPRESSION: 1. Proximal left radial artery occlusion. Collateral flow via palmar arches reconstitutes the distal radial artery, with retrograde flow into the fistula, at an inadequate rate. This is not amenable to percutaneous intervention. After telephone consultation with Dr. Jonnie Finner, plan made to proceed with hemodialysis catheter placement.   Electronically Signed   By: Arne Cleveland M.D.   On: 09/28/2013 14:17   Ir US Guide Vasc Access Left  09/28/2013   CLINICAL DATA:  End stage renal disease, poor function of left arm dialysis fistula.  TECHNIQUE: DIALYSIS SHUNTOGRAM  The procedure, risks (including but not limited to bleeding, infection, organ damage ), benefits, and alternatives were explained to the patient. Questions regarding the procedure were encouraged and answered. The patient understands and consents to the procedure.  In  preparation for possible declot procedure, the skin overlying the left forearm fistula was prepped with Betadine, draped in usual sterile fashion, infiltrated locally with 1% lidocaine.  Intravenous Fentanyl and Versed were administered as conscious sedation during continuous cardiorespiratory monitoring by the radiology RN, with a total moderate sedation time of eighty minutes.  The outflow vein from the fistula was accessed just central to the fistula with a 21 gauge micropuncture needle under real-time ultrasound guidance. Needle was exchanged over a 018 guide wire for a 4 French transitional dilator. This was used for outflow venography.  The outflow vein was then accessed more centrally retrograde with a 21 gauge micropuncture needle under real-time ultrasound guidance. The needle was exchanged over the 018 wire for the transitional dilator, which allow placement of a Bentson wire. Over this, a 5 [place] Kumpe catheter was advanced and used to negotiate the arterial anastomosis for additional fistulography. Attempts were made to catheterize the proximal radial artery using a variety of catheter & guidewire combinations but these were ultimately unsuccessful. Assessment of the arterial anatomy of the forearm was performed with a blood pressure cuff at the upper arm level.  Ultimately, No declot procedure was needed. The access sites were removed and hemostasis achieved with manual compression.  No immediate complication.  COMPARISON:  None  FLUOROSCOPY TIME:  10 min 12 seconds  ACCESS: Proximal left radial artery occlusion. Not amenable to percutaneous intervention. Consider surgical consultation.  FINDINGS: The outflow cephalic vein from the fistula is widely patent. Central venous system through the SVC is patent. There is a competing outflow vein at the level of the midforearm. There is stenosis at the arterial anastomosis. The proximal radial artery is occluded, not opacified central to the level of the  fistula. There  is retrograde flow in the distal radial artery supplied by palmar arches, supplying low-grade flow across the fistula.  IMPRESSION: 1. Proximal left radial artery occlusion. Collateral flow via palmar arches reconstitutes the distal radial artery, with retrograde flow into the fistula, at an inadequate rate. This is not amenable to percutaneous intervention. After telephone consultation with Dr. Arlean Hopping, plan made to proceed with hemodialysis catheter placement.   Electronically Signed   By: Oley Balm M.D.   On: 09/28/2013 14:17   Ir US Guide Vasc Access Right  09/28/2013   CLINICAL DATA:  Non functional left arm AV dialysis fistula, not amenable to percutaneous intervention. Dialysis access needed.  EXAM: TUNNELED HEMODIALYSIS CATHETER PLACEMENT WITH ULTRASOUND AND FLUOROSCOPIC GUIDANCE  TECHNIQUE: The procedure, risks, benefits, and alternatives were explained to the patient. Questions regarding the procedure were encouraged and answered. The patient understands and consents to the procedure. As antibiotic prophylaxis, cefazolin 1 g was ordered pre-procedure and administered intravenously within one hour of incision.Patency of the right IJ vein was confirmed with ultrasound with image documentation. An appropriate skin site was determined. Region was prepped using maximum barrier technique including cap and mask, sterile gown, sterile gloves, large sterile sheet, and Chlorhexidine as cutaneous antisepsis. The region was infiltrated locally with 1% lidocaine.  Intravenous Fentanyl and Versed were administered as conscious sedation during continuous cardiorespiratory monitoring by the radiology RN, with a total moderate sedation time of 80 minutes.  Under real-time ultrasound guidance, the right IJ vein was accessed with a 21 gauge micropuncture needle; the needle tip within the vein was confirmed with ultrasound image documentation. Needle exchanged over the 018 guidewire for transitional  dilator, which allowed advancement of a Benson wire into the IVC. Over this, an MPA catheter was advanced. A Hemosplit 19 hemodialysis catheter was tunneled from the right anterior chest wall approach to the right IJ dermatotomy site. The MPA catheter was exchanged over an Amplatz wire for serial vascular dilators which allow placement of a peel-away sheath, through which the catheter was advanced under intermittent fluoroscopy, positioned with its tips in the proximal and midright atrium. Spot chest radiograph confirms good catheter position. No pneumothorax. Catheter was flushed and primed per protocol. Catheter secured externally with O Prolene sutures. The right IJ dermatotomy site was closed with 3-0 Monocryl subcutaneous suture and covered with Dermabond. No immediate complication.  COMPARISON:  None  FLUOROSCOPY TIME:  10 min 12 seconds  ACCESS: Remains approachable for percutaneous intervention as needed.  IMPRESSION: 1. Technically successful placement of tunneled right IJ hemodialysis catheter with ultrasound and fluoroscopic guidance. Ready for routine use.   Electronically Signed   By: Oley Balm M.D.   On: 09/28/2013 14:10   Dg Chest Port 1 View  10/01/2013   CLINICAL DATA:  Atelectasis.  EXAM: PORTABLE CHEST - 1 VIEW  COMPARISON:  Chest radiograph 09/30/2013.  FINDINGS: Right-sided hemodialysis catheter unchanged. Left-sided central venous catheter tip projects over the superior cavoatrial junction. Interval extubation. NG tube tip and side-port project over the left upper quadrant. Stable cardiac and mediastinal contours. Small bilateral pleural effusions with underlying opacities likely representing atelectasis.  IMPRESSION: Marked gaseous distention of the visualized upper abdominal bowel loops, recommend correlation with dedicated abdominal radiography.  Interval extubation.  Otherwise stable support apparatus.  Small bilateral pleural effusions.  Underlying atelectasis.   Electronically  Signed   By: Annia Belt M.D.   On: 10/01/2013 08:08   Dg Chest Port 1 View  09/30/2013   CLINICAL DATA:  Central  line placement  EXAM: PORTABLE CHEST - 1 VIEW  COMPARISON:  Air radiograph 09/29/2013  FINDINGS: There has been interval placement of a left IJ central venous catheter with tip overlying the cavoatrial junction. Tip of the endotracheal tube is positioned 3.6 cm above the carina. Right-sided hemodialysis catheter is unchanged.  Sequelae of prior CABG again noted.  Lungs are hypoinflated. Bilateral pleural effusions, left greater than right, are slightly improved relative to prior study. Associated bibasilar atelectasis is present. No new focal infiltrate. No overt pulmonary edema. No pneumothorax.  Osseous structures are unchanged.  IMPRESSION: 1. Tip of the left IJ central venous catheter overlying the cavoatrial junction. Remaining support apparatus as above. 2. Slight interval decrease in size of bilateral pleural effusions, left greater than right, with similar bibasilar atelectasis.   Electronically Signed   By: Jeannine Boga M.D.   On: 09/30/2013 03:23   Dg Chest Port 1 View  09/29/2013   CLINICAL DATA:  Status post right partial colectomy  EXAM: PORTABLE CHEST - 1 VIEW  COMPARISON:  02/17/2013  FINDINGS: Cardiac shadow remains mildly enlarged. A dialysis catheter is seen in satisfactory position. An endotracheal tube is now noted 3.8 cm above the carina. A nasogastric catheter extends into the stomach. Bilateral pleural effusions left greater than right are seen. The left effusion appears chronic in nature when compare with the previous exam from multiple months previous. No new focal abnormality is seen.  IMPRESSION: Tubes and lines as described.  Pleural effusions left greater than right. The left effusion appears chronic in nature.   Electronically Signed   By: Inez Catalina M.D.   On: 09/29/2013 18:00   Dg Abd 2 Views  09/28/2013   CLINICAL DATA:  Small bowel obstruction.  Abdominal distention. NG tube placement.  EXAM: ABDOMEN - 2 VIEW  COMPARISON:  09/27/2013.  FINDINGS: Supine and left lateral decubitus views of the abdomen again demonstrate diffuse dilatation of the small bowel loops in the central abdomen measuring up to 4.3 cm in diameter. Multiple air-fluid levels are noted on the left lateral decubitus view. No frank pneumoperitoneum. Paucity of distal colonic gas, although there is a small amount of rectal gas present. Nasogastric tube appears coiled in the stomach. 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  IMPRESSION: 1. Bowel gas pattern remains compatible with a small bowel obstruction. 2. No pneumoperitoneum at this time. 3. Nasogastric tube remains coiled in the stomach.   Electronically Signed   By: Vinnie Langton M.D.   On: 09/28/2013 10:38   Dg Abd 2 Views  09/27/2013   CLINICAL DATA:  Mid abdominal pain, back pain  EXAM: ABDOMEN - 2 VIEW  COMPARISON:  DG ABD PORTABLE 1V dated 09/26/2013  FINDINGS: Multiple dilated loops small bowel appreciated throughout the abdomen. Air-fluid levels identified. There is no evidence of free air. A moderate amount of stools appreciated region of the ascending colon. There is a paucity of distal bowel gas. The NG tube is seen with tip curled in the region of the stomach. When compared to previous study the dilated loops of small bowel have decreased in size and distribution.  IMPRESSION: Findings consistent with an improving small bowel obstruction. There is no evidence of free air.   Electronically Signed   By: Margaree Mackintosh M.D.   On: 09/27/2013 13:27   Dg Abd Portable 1v  10/02/2013   CLINICAL DATA:  Evaluate for resolution of ileus  EXAM: PORTABLE ABDOMEN -  1 VIEW  COMPARISON:  DG ABD PORTABLE 1V dated 10/01/2013; DG ABD PORTABLE 2V dated 09/29/2013; CT ABD/PELV WO CM dated 09/25/2013  FINDINGS: There is a paucity of bowel gas. There is persistent moderate  gas distention of several mildly dilated loops of rather featureless appearing small bowel within the left mid hemi abdomen though there is decreased gaseous distention of these bowel loops. A minimal amount of air is seen within the ascending and descending colon.  Nondiagnostic evaluation pneumoperitoneum. An enteric tube and side port overlie the expected location of the mid body of the stomach.  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 Post bilateral total hip replacements, incompletely imaged.  IMPRESSION: 1. Findings most suggestive of improving ileus, though note, there is persistent mild gas distention of several featureless loops of small bowel within the mid abdomen. Clinical correlation is advised. 2. Sequela of ankylosing spondylitis   Electronically Signed   By: Sandi Mariscal M.D.   On: 10/02/2013 08:21   Dg Abd Portable 1v  10/01/2013   CLINICAL DATA:  Distension.  Recent hemicolectomy with colostomy.  EXAM: PORTABLE ABDOMEN - 1 VIEW  COMPARISON:  09/29/2013 and 09/28/2013  FINDINGS: Central venous catheter unchanged with tip over the right atrium. Nasogastric tube is present with tip over the midline upper abdomen likely over the distal stomach. There is continued evidence of multiple air-filled dilated small bowel loops. There is minimal air within the colon. There is continued evidence of an unusual air collection in the right upper quadrant as cannot exclude free peritoneal air as seen on the prior study. Remainder of the exam is unchanged.  IMPRESSION: Persistent air-filled dilated small bowel loops with paucity of colonic gas. Findings suggesting small bowel obstructive process. Persistent unusual air collection over the right upper quadrant as this may represent free peritoneal air as seen previously likely due to patient's recent hemicolectomy.  Nasogastric tube with tip over the mid abdomen likely in the distal stomach.  Findings discussed with  patient's nurse, Morey Hummingbird, at the time of dictation.   Electronically Signed   By: Marin Olp M.D.   On: 10/01/2013 14:56   Dg Abd Portable 1v  09/26/2013   CLINICAL DATA:  Small bowel obstruction, abdominal pain and distention  EXAM: PORTABLE ABDOMEN - 1 VIEW  COMPARISON:  Prior abdominal radiograph 09/25/2013  FINDINGS: Persistent gaseous distension of the stomach and multiple loops of small bowel. The tip of the nasogastric tube projects over the gastric bubble, however the proximal side hole is in the region of the gastroesophageal junction. The degree of small-bowel distention is not significantly changed since the prior study. Maximal small bowel diameter is again 5 cm. No large free air on this single supine radiograph. Surgical changes of prior median sternotomy and bilateral total hip arthroplasties. The bones are osteopenic. Atherosclerotic vascular calcifications noted in the aorta.  IMPRESSION: 1. No significant interval change in the appearance of high-grade small bowel obstruction. Maximal small bowel diameter is again 5 cm. 2. The proximal side port of the nasogastric tube is in the GE junction. Consider advancing 5 cm for more optimal placement.   Electronically Signed   By: Jacqulynn Cadet M.D.   On: 09/26/2013 07:35   Dg Abd Portable 1v  09/25/2013   CLINICAL DATA:  Nasogastric tube placement  EXAM: PORTABLE ABDOMEN - 1 VIEW  COMPARISON:  CT abdomen September 25, 2013  FINDINGS: Nasogastric tube is identified with distal tip in the mid stomach. The stomach  is air-filled and distended. There are multiple dilated small bowel loops.  IMPRESSION: Naso gastric tube distal tip in the mid stomach. Small bowel obstruction.   Electronically Signed   By: Abelardo Diesel M.D.   On: 09/25/2013 22:56   Dg Abd Portable 2v  10/06/2013   CLINICAL DATA:  Severe abdominal distention and pain. Postop from right hemicolectomy on 09/29/2013.  EXAM: PORTABLE ABDOMEN - 2 VIEW  COMPARISON:  10/02/2013  FINDINGS:  Increased dilatation of small bowel loops seen. A moderate amount of free intraperitoneal air is also demonstrated and appears increased since previous study. Previous seen nasogastric tube is no longer visualized.  IMPRESSION: Increased small bowel dilatation and free intraperitoneal air since prior study, highly suspicious for anastomotic leak or perforated viscus.  Critical Value/emergent results were called by telephone at the time of interpretation on 10/06/2013 at 2:01 PM to the patient's floor nurse Jonni Sanger, who verbally acknowledged these results.   Electronically Signed   By: Earle Gell M.D.   On: 10/06/2013 14:04   Dg Abd Portable 2v  09/29/2013   CLINICAL DATA:  Status post colonoscopy. Question free intraperitoneal air.  EXAM: PORTABLE ABDOMEN - 2 VIEW  COMPARISON:  CT abdomen and pelvis 09/25/2013.  FINDINGS: There is a large volume of free intraperitoneal air. NG tube is in place with tip in the stomach.  IMPRESSION: Study is positive for free intraperitoneal air.  Critical Value/emergent results were called by telephone at the time of interpretation on 09/29/2013 at 12:27 PM to Dr. Ronald Lobo , who verbally acknowledged these results.   Electronically Signed   By: Inge Rise M.D.   On: 09/29/2013 12:30   Ir Shuntogram/ Fistulagram Left Mod Sed  09/28/2013   CLINICAL DATA:  End stage renal disease, poor function of left arm dialysis fistula.  TECHNIQUE: DIALYSIS SHUNTOGRAM  The procedure, risks (including but not limited to bleeding, infection, organ damage ), benefits, and alternatives were explained to the patient. Questions regarding the procedure were encouraged and answered. The patient understands and consents to the procedure.  In preparation for possible declot procedure, the skin overlying the left forearm fistula was prepped with Betadine, draped in usual sterile fashion, infiltrated locally with 1% lidocaine.  Intravenous Fentanyl and Versed were administered as conscious  sedation during continuous cardiorespiratory monitoring by the radiology RN, with a total moderate sedation time of eighty minutes.  The outflow vein from the fistula was accessed just central to the fistula with a 21 gauge micropuncture needle under real-time ultrasound guidance. Needle was exchanged over a 018 guide wire for a 4 French transitional dilator. This was used for outflow venography.  The outflow vein was then accessed more centrally retrograde with a 21 gauge micropuncture needle under real-time ultrasound guidance. The needle was exchanged over the 018 wire for the transitional dilator, which allow placement of a Bentson wire. Over this, a 5 [place] Kumpe catheter was advanced and used to negotiate the arterial anastomosis for additional fistulography. Attempts were made to catheterize the proximal radial artery using a variety of catheter & guidewire combinations but these were ultimately unsuccessful. Assessment of the arterial anatomy of the forearm was performed with a blood pressure cuff at the upper arm level.  Ultimately, No declot procedure was needed. The access sites were removed and hemostasis achieved with manual compression.  No immediate complication.  COMPARISON:  None  FLUOROSCOPY TIME:  10 min 12 seconds  ACCESS: Proximal left radial artery occlusion. Not amenable to percutaneous intervention. Consider  surgical consultation.  FINDINGS: The outflow cephalic vein from the fistula is widely patent. Central venous system through the SVC is patent. There is a competing outflow vein at the level of the midforearm. There is stenosis at the arterial anastomosis. The proximal radial artery is occluded, not opacified central to the level of the fistula. There is retrograde flow in the distal radial artery supplied by palmar arches, supplying low-grade flow across the fistula.  IMPRESSION: 1. Proximal left radial artery occlusion. Collateral flow via palmar arches reconstitutes the distal radial  artery, with retrograde flow into the fistula, at an inadequate rate. This is not amenable to percutaneous intervention. After telephone consultation with Dr. Jonnie Finner, plan made to proceed with hemodialysis catheter placement.   Electronically Signed   By: Arne Cleveland M.D.   On: 09/28/2013 14:17         Subjective: Patient states the pain has overall improved. Denies any chest pain, breath, nausea, vomiting. He continues to have back pain and abdominal pain especially with movement.  Objective: Filed Vitals:   10/10/13 1530 10/10/13 1600 10/10/13 1630 10/10/13 1702  BP: 88/75 87/55 82/51  111/52  Pulse: 75 75 84 89  Temp:    97.7 F (36.5 C)  TempSrc:    Oral  Resp: 18 18 18 18   Height:      Weight:      SpO2:    96%    Intake/Output Summary (Last 24 hours) at 10/10/13 1816 Last data filed at 10/10/13 1702  Gross per 24 hour  Intake    390 ml  Output   1023 ml  Net   -633 ml   Weight change:  Exam:   General:  Pt is alert, follows commands appropriately, not in acute distress  HEENT: No icterus, No thrush,  [place]/AT  Cardiovascular: RRR, S1/S2, no rubs, no gallops  Respiratory: CTA bilaterally, no wheezing, no crackles, no rhonchi  Abdomen: Soft/+BS, diffusely tender. Ostomy with stool. No rebound tenderness.  Extremities: No edema, No lymphangitis, No petechiae, No rashes, no synovitis  Data Reviewed: Basic Metabolic Panel:  Recent Labs Lab 10/04/13 1030 10/05/13 0540 10/07/13 0658 10/10/13 1342  NA  --  141 138 136*  K 3.9 3.8 4.2 4.4  CL  --  104 99 99  CO2  --  26 28 23   GLUCOSE  --  106* 70 114*  BUN  --  16 14 19   CREATININE  --  2.57* 2.44* 3.51*  CALCIUM  --  7.5* 7.6* 7.5*  PHOS  --  3.2 3.3 3.1   Liver Function Tests:  Recent Labs Lab 10/05/13 0540 10/07/13 0658 10/10/13 1342  ALBUMIN 1.4* 1.5* 1.4*   No results found for this basename: LIPASE, AMYLASE,  in the last 168 hours No results found for this basename: AMMONIA,  in the  last 168 hours CBC:  Recent Labs Lab 10/05/13 0540 10/06/13 0525 10/07/13 0658 10/08/13 0405 10/10/13 1342  WBC 12.7* 14.3* 14.6* 11.6* 20.2*  HGB 9.1* 9.2* 9.6* 9.7* 10.0*  HCT 28.9* 29.4* 30.6* 31.6* 32.1*  MCV 96.0 96.7 97.1 97.8 98.5  PLT 329 363 411* 360 389   Cardiac Enzymes: No results found for this basename: CKTOTAL, CKMB, CKMBINDEX, TROPONINI,  in the last 168 hours BNP: No components found with this basename: POCBNP,  CBG:  Recent Labs Lab 10/09/13 1637 10/09/13 2055 10/10/13 0734 10/10/13 1149 10/10/13 1745  GLUCAP 138* 263* 130* 114* 88    Recent Results (from the past 240 hour(s))  CLOSTRIDIUM DIFFICILE BY PCR     Status: None   Collection Time    10/04/13 10:17 AM      Result Value Ref Range Status   C difficile by pcr NEGATIVE  NEGATIVE Final     Scheduled Meds: . acetaminophen  1,000 mg Oral TID  . amiodarone  200 mg Oral Daily  . antiseptic oral rinse  1 application Mouth Rinse QID  . calcitonin (salmon)  1 spray Alternating Nares Daily  . chlorhexidine  15 mL Mouth/Throat BID  . darbepoetin  150 mcg Intravenous Q Mon-HD  . doxercalciferol  2 mcg Intravenous Q M,W,F-HD  . feeding supplement (NEPRO CARB STEADY)  237 mL Oral Q24H  . feeding supplement (RESOURCE BREEZE)  1 Container Oral BID BM  . ferric gluconate (FERRLECIT/NULECIT) IV  62.5 mg Intravenous Q Fri-HD  . gabapentin  300 mg Oral Daily  . hydrALAZINE  10 mg Oral Once  . lidocaine  1 patch Transdermal Q24H  . midodrine  10 mg Oral BID WC  . multivitamin  1 tablet Oral QHS  . OxyCODONE  20 mg Oral Q12H  . pantoprazole  40 mg Oral Q1200  . psyllium  1 packet Oral BID  . traMADol  50 mg Oral Q12H   Continuous Infusions: . sodium chloride Stopped (10/03/13 1300)     Jacques Fife, DO  [facility] Pager [phone]  If 7PM-7AM, please contact night-coverage [email or web address] Password [facility] 10/10/2013, 6:16 PM   LOS: 15 days

## 2013-10-10 NOTE — Consult Note (Signed)
WOC wound follow up   Dressing procedure/placement/frequency: requested per [place] team member to follow up this am on VAC dressing. Intact, canister almost full, serous drainage. Seal intact.  Ordered supplies for dressing change tom. / and canisters for changes per bedside nurse as needed. WOC ostomy follow up Stoma type/location:  RLQ, ilestomy  Output 1/2 full of thin, brown output Ostomy pouching: 2pc. In place from pouch change yesterday.  Pouch intact Education provided: reviewed lock and roll closure, however pts pouch is turned to the side current for ease of emptying for the staff.  I will bring up pouches to have patient practice with.  He is planned for eventual discharge back to [place] but would like pt to learn to empty prior to discharge since he has larger volumes of output.  He asked about the pouch today and the length of time he will have, instructed him to follow up with surgeon on this.   [place] team will follow along with you for ostomy and wound care Mashal Slavick [facility] RN,CWOCN [phone]

## 2013-10-10 NOTE — Progress Notes (Signed)
Patient remains npo past midnight as ordered.

## 2013-10-10 NOTE — Procedures (Signed)
I was present at this dialysis session, have reviewed the session itself and made  appropriate changes  Rob Rebeckah Masih MD (pgr) [phone]    (c) [phone] 10/10/2013, 3:10 PM   

## 2013-10-10 NOTE — Progress Notes (Signed)
  [facility] Progress Note   Subjective: On dialysis, no complaints  Filed Vitals:   10/10/13 1400 10/10/13 1405 10/10/13 1430 10/10/13 1446  BP: 75/40 81/47 82/46  96/36  Pulse: 76 72 82 76  Temp:      TempSrc:      Resp: 18 18 18 18   Height:      Weight:      SpO2:      Exam Alert, pale elderly male, no distress, calm No jvd Chest clear bilat RRR no MRG Abd soft, midline vac in place, RLQ ostomy No leg or UE edema Neuro is nf, ox3, gen weakness R IJ tunneled HD cath in place   Dialysis: [facility] 4h   69kg   2K/2.25 [place]  Heparin 5000   R IJ Cath (L AVF failed this admit)  Hectorol 2     Epo 5000     Venofer 50/wk, started this admit Recent lab: Hb 9.9  pth 296   Assessment: 1 Invasive colon Ca 2 Bowel obstruction s/p colectomy/ ileostomy 3 Wound dehiscence, s/p vac 4 ESRD on HD 5 Recent L1 vertebral body fx- nonsurg Rx 6 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, 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 8 Anemia- darbe 150/wk 9 HTN/vol- chronic low BP's on midodrine, at dry wt 10 Afib/CAD- no AC due to GIB, po amio 11 Dispo- back to snf when ready   Plan- HD today    Kelly Splinter MD  pager [phone]    cell [phone]  10/10/2013, 3:15 PM     Recent Labs Lab 10/05/13 0540 10/07/13 0658 10/10/13 1342  NA 141 138 136*  K 3.8 4.2 4.4  CL 104 99 99  CO2 26 28 23   GLUCOSE 106* 70 114*  BUN 16 14 19   CREATININE 2.57* 2.44* 3.51*  CALCIUM 7.5* 7.6* 7.5*  PHOS 3.2 3.3 3.1    Recent Labs Lab 10/05/13 0540 10/07/13 0658 10/10/13 1342  ALBUMIN 1.4* 1.5* 1.4*    Recent Labs Lab 10/07/13 0658 10/08/13 0405 10/10/13 1342  WBC 14.6* 11.6* 20.2*  HGB 9.6* 9.7* 10.0*  HCT 30.6* 31.6* 32.1*  MCV 97.1 97.8 98.5  PLT 411* 360 389   . acetaminophen  1,000 mg Oral TID  . amiodarone  200 mg Oral Daily  . antiseptic oral rinse  1 application Mouth Rinse QID  . calcitonin (salmon)  1 spray Alternating Nares Daily  . chlorhexidine  15 mL  Mouth/Throat BID  . darbepoetin  150 mcg Intravenous Q Mon-HD  . doxercalciferol  2 mcg Intravenous Q M,W,F-HD  . feeding supplement (NEPRO CARB STEADY)  237 mL Oral Q24H  . feeding supplement (RESOURCE BREEZE)  1 Container Oral BID BM  . ferric gluconate (FERRLECIT/NULECIT) IV  62.5 mg Intravenous Q Fri-HD  . gabapentin  300 mg Oral Daily  . hydrALAZINE  10 mg Oral Once  . lidocaine  1 patch Transdermal Q24H  . midodrine  10 mg Oral BID WC  . multivitamin  1 tablet Oral QHS  . OxyCODONE  20 mg Oral Q12H  . pantoprazole  40 mg Oral Q1200  . psyllium  1 packet Oral BID  . traMADol  50 mg Oral Q12H   . sodium chloride Stopped (10/03/13 1300)   bismuth subsalicylate, HYDROmorphone (DILAUDID) injection, loperamide, methocarbamol, oxyCODONE, promethazine, simethicone

## 2013-10-10 NOTE — Progress Notes (Signed)
11 Days Post-Op  Subjective: Pt feels good, no abdominal pain, N/V.  Ambulating some, c/o feet/legs hurting.  Ostomy putting out, dressing changes going well.  Vac due to be changed tomorrow.  Getting ostomy teaching.    Objective: Vital signs in last 24 hours: Temp:  [97.8 F (36.6 C)-98.7 F (37.1 C)] 98.5 F (36.9 C) (02/23 0820) Pulse Rate:  [73-86] 73 (02/23 0820) Resp:  [18] 18 (02/23 0820) BP: (105-128)/(62-71) 105/68 mmHg (02/23 0820) SpO2:  [98 %-100 %] 98 % (02/23 0820) Weight:  [151 lb 2 oz (68.55 kg)] 151 lb 2 oz (68.55 kg) (02/22 2052) Last BM Date: 10/09/13  Intake/Output from previous day: 02/22 0701 - 02/23 0700 In: 510 [P.O.:510] Out: 375 [Drains:300; Stool:75] Intake/Output this shift:    PE: Gen:  Alert, NAD, pleasant Abd: Soft, NT/ND, +BS, no HSM, incisions C/D/I, wound vac with high peritoneal fluid output (yellowish pink), midline wound around black sponge looks clean   Lab Results:   Recent Labs  10/08/13 0405  WBC 11.6*  HGB 9.7*  HCT 31.6*  PLT 360   BMET No results found for this basename: NA, K, CL, CO2, GLUCOSE, BUN, CREATININE, CALCIUM,  in the last 72 hours PT/INR No results found for this basename: LABPROT, INR,  in the last 72 hours CMP     Component Value Date/Time   NA 138 10/07/2013 0658   K 4.2 10/07/2013 0658   CL 99 10/07/2013 0658   CO2 28 10/07/2013 0658   GLUCOSE 70 10/07/2013 0658   BUN 14 10/07/2013 0658   CREATININE 2.44* 10/07/2013 0658   CALCIUM 7.6* 10/07/2013 0658   PROT 5.7* 09/25/2013 1621   ALBUMIN 1.5* 10/07/2013 0658   AST 11 09/25/2013 1621   ALT 6 09/25/2013 1621   ALKPHOS 138* 09/25/2013 1621   BILITOT 0.5 09/25/2013 1621   GFRNONAA 25* 10/07/2013 0658   GFRAA 29* 10/07/2013 0658   Lipase     Component Value Date/Time   LIPASE 29 10/22/2012 0900       Studies/Results: No results found.  Anti-infectives: Anti-infectives   Start     Dose/Rate Route Frequency Ordered Stop   09/30/13 1400  ertapenem (INVANZ)  0.5 g in sodium chloride 0.9 % 50 mL IVPB  Status:  Discontinued     500 mg 100 mL/hr over 30 Minutes Intravenous Every 24 hours 09/29/13 2202 10/07/13 1106   09/29/13 1415  ertapenem (INVANZ) 1 g in sodium chloride 0.9 % 50 mL IVPB     1 g 100 mL/hr over 30 Minutes Intravenous  Once 09/29/13 1159 09/29/13 1412   09/29/13 0600  cefUROXime (ZINACEF) 1.5 g in dextrose 5 % 50 mL IVPB  Status:  Discontinued     1.5 g 100 mL/hr over 30 Minutes Intravenous On call to O.R. 09/28/13 1711 09/29/13 2203   09/28/13 1200  ceFAZolin (ANCEF) IVPB 2 g/50 mL premix    Comments:  Tube to 17   2 g 100 mL/hr over 30 Minutes Intravenous  Once 09/28/13 1155 09/28/13 1246   09/27/13 1200  vancomycin (VANCOCIN) IVPB 1000 mg/200 mL premix  Status:  Discontinued     1,000 mg 200 mL/hr over 60 Minutes Intravenous Every M-W-F (Hemodialysis) 09/26/13 1638 09/26/13 1721   09/26/13 1745  metroNIDAZOLE (FLAGYL) IVPB 500 mg  Status:  Discontinued     500 mg 100 mL/hr over 60 Minutes Intravenous Every 8 hours 09/26/13 1722 10/03/13 1112   09/26/13 1200  vancomycin (VANCOCIN) IVPB 750 mg/150 ml  premix  Status:  Discontinued     750 mg 150 mL/hr over 60 Minutes Intravenous Every M-W-F (Hemodialysis) 09/25/13 2344 09/26/13 1051   09/26/13 1200  vancomycin (VANCOCIN) IVPB 1000 mg/200 mL premix  Status:  Discontinued     1,000 mg 200 mL/hr over 60 Minutes Intravenous Every M-W-F (Hemodialysis) 09/26/13 1052 09/26/13 1638   09/26/13 0600  piperacillin-tazobactam (ZOSYN) IVPB 2.25 g  Status:  Discontinued     2.25 g 100 mL/hr over 30 Minutes Intravenous 3 times per day 09/25/13 2126 09/26/13 1721   09/26/13 0000  vancomycin (VANCOCIN) 500 mg in sodium chloride irrigation 0.9 % 100 mL ENEMA  Status:  Discontinued     500 mg Rectal 4 times per day 09/25/13 2337 09/26/13 1722   09/25/13 2130  vancomycin (VANCOCIN) IVPB 750 mg/150 ml premix     750 mg 150 mL/hr over 60 Minutes Intravenous  Once 09/25/13 2124 09/26/13 0145    09/25/13 2000  piperacillin-tazobactam (ZOSYN) IVPB 3.375 g     3.375 g 100 mL/hr over 30 Minutes Intravenous  Once 09/25/13 1853 09/25/13 2002   09/25/13 2000  vancomycin (VANCOCIN) IVPB 1000 mg/200 mL premix     1,000 mg 200 mL/hr over 60 Minutes Intravenous  Once 09/25/13 1859 09/25/13 2113       Assessment/Plan SBO 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 Grandville Silos 09/29/13  Severe colitis  Mucosal hemorrhage/ulceration  -colonoscopy biopsy report shows adenocarcinoma. 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  -tolerating reg diet -Vac changes Tu/Th/Sat -Oxycodone, Oxycontin, Tramadol BID, robaxin (could try weaning), tylenol -Oncology Dr. Hans Eden for outpatient follow up  -resume diet -Would hold off discharge until vac output is less - put 32mL/ out in last 24 hours, vac change tomorrow to evaluate dehiscence      LOS: 15 days    [name], Nicholas Eidem 10/10/2013, 11:17 AM Pager: [phone]

## 2013-10-10 NOTE — Progress Notes (Signed)
Patient with large amount of thin serous drainage from [facility] - concerning for fascial dehiscence.  Patient is going to dialysis now and had lunch.  Will make him NPO after midnight.  Take down VAC in am with possible need for reexploration and closure of his abdominal wall.  Imogene Burn. Georgette Dover, MD, [facility]  General/ Trauma Surgery  10/10/2013 1:06 PM

## 2013-10-11 ENCOUNTER — Other Ambulatory Visit (HOSPITAL_COMMUNITY): Payer: Medicare Other

## 2013-10-11 ENCOUNTER — Ambulatory Visit: Payer: Medicare Other | Admitting: Vascular Surgery

## 2013-10-11 LAB — CBC
HCT: 28.7 % — ABNORMAL LOW (ref 39.0–52.0)
Hemoglobin: 8.9 g/dL — ABNORMAL LOW (ref 13.0–17.0)
MCH: 31.2 pg (ref 26.0–34.0)
MCHC: 31 g/dL (ref 30.0–36.0)
MCV: 100.7 fL — ABNORMAL HIGH (ref 78.0–100.0)
Platelets: 258 10*3/uL (ref 150–400)
RBC: 2.85 MIL/uL — AB (ref 4.22–5.81)
RDW: 21.9 % — ABNORMAL HIGH (ref 11.5–15.5)
WBC: 18.3 10*3/uL — ABNORMAL HIGH (ref 4.0–10.5)

## 2013-10-11 LAB — GLUCOSE, CAPILLARY
GLUCOSE-CAPILLARY: 118 mg/dL — AB (ref 70–99)
GLUCOSE-CAPILLARY: 155 mg/dL — AB (ref 70–99)
Glucose-Capillary: 171 mg/dL — ABNORMAL HIGH (ref 70–99)
Glucose-Capillary: 147 mg/dL — ABNORMAL HIGH (ref 70–99)

## 2013-10-11 LAB — CLOSTRIDIUM DIFFICILE BY PCR: CDIFFPCR: NEGATIVE

## 2013-10-11 MED ORDER — ALLOPURINOL 100 MG PO TABS: 100.0000 mg | ORAL_TABLET | Freq: Every day | ORAL | Status: AC

## 2013-10-11 MED ORDER — OXYCODONE HCL ER 20 MG PO T12A: 20.0000 mg | EXTENDED_RELEASE_TABLET | Freq: Two times a day (BID) | ORAL | Status: AC

## 2013-10-11 MED ORDER — METHOCARBAMOL 750 MG PO TABS: 750.0000 mg | ORAL_TABLET | Freq: Four times a day (QID) | ORAL | Status: AC | PRN

## 2013-10-11 MED ORDER — OXYCODONE HCL 5 MG PO TABS: 5.0000 mg | ORAL_TABLET | ORAL | Status: AC | PRN

## 2013-10-11 MED ORDER — TRAMADOL HCL 50 MG PO TABS: 50.0000 mg | ORAL_TABLET | Freq: Two times a day (BID) | ORAL | Status: AC

## 2013-10-11 MED ORDER — SODIUM CHLORIDE 0.9 % IV SOLN: 62.5000 mg | INTRAVENOUS | Status: DC

## 2013-10-11 MED ORDER — ASPIRIN EC 81 MG PO TBEC: 81.0000 mg | DELAYED_RELEASE_TABLET | Freq: Every day | ORAL | Status: AC

## 2013-10-11 NOTE — Progress Notes (Signed)
The fascia has some slight necrosis with some peritoneal fluid seeping between the sutures, but he has no visible dehiscence.  Reexploration would be risky without a strong indication at this time, so I think he should be managed with regular VAC changes.  Hopefully, he will granulate over this fascial closure and the drainage will stop.  Imogene Burn. Georgette Dover, MD, [facility]  General/ Trauma Surgery  10/11/2013 8:36 AM

## 2013-10-11 NOTE — Consult Note (Signed)
WOC wound follow up Wound type:Midline surgical Measurement: 14 x 3 x 3cm Wound bed: light grey adherent tissue in wound bed, blue suture evident. Less Grey tissue than on Sunday, wound sides are red, moist and red tissue is visible through grey tissue in areas. Drainage (amount, consistency, odor) Still with thin serous drainage in npwt cannister Periwound:intact Dressing procedure/placement/frequency:NPWT, 1 pc black foam.  175mmHg continuous pressure. Plan is for continuation of NPWT at the SNF.  Next dressing change is due on Friday, 10/14/13.  WOC ostomy follow up Stoma type/location: RLQ ileostomy Stomal assessment/size: 1 and 1/8 inch with mucocutaneous separation from 11-3 o'clock. Peristomal assessment:  Treatment options for stomal/peristomal skin: Pouch intact,  Output: brown stool. Ostomy pouching: 2pc. , 2 and 1/4 inch pouching system with lock and roll closure.  Patient is able to perform pouch closure with cueing from [facility] nurse.. Education provided: Patient reassured that he will have assistance with ostomy at the SNF.  Supplies (3 set-ups), patient education booklet and 1-page instruction sheet for pouching provided for discharge. Patient is emotional today and while happy to be going to the facility of his choosing where he has stayed in the past, he appreciates the intensity of care in the acute care hospital.  Supported and reassured that those feelings are to be expected. Patient is ready for discharge. Thanks, Maudie Flakes, MSN, RN, [place], [place], [place] [phone])

## 2013-10-11 NOTE — Discharge Summary (Addendum)
Physician Discharge Summary  Nicholas [name] MBW:[id number] DOB: 11-25-1940 DOA: 09/25/2013  PCP: [facility]  Admit date: 09/25/2013 Discharge date: 10/11/2013  Recommendations for Outpatient Follow-up:  1. Pt will need to follow up with PCP in 2 weeks post discharge 2. Please obtain BMP to evaluate electrolytes and kidney function 3. Please also check CBC to evaluate Hg and Hct levels 4. Please transport pt to dialysis Mon-Wed-Frid   Discharge Diagnoses:  Active Problems:   ESRD (end stage renal disease) on dialysis   DM (diabetes mellitus), type 2 with renal complications   Ischemic cardiomyopathy   CAD (coronary artery disease)   Intractable low back pain   Systolic CHF   L1 vertebral fracture   Protein-calorie malnutrition, severe   HCAP (healthcare-associated pneumonia)   Hypokalemia   Peritonitis   Cecal cancer s/p colectomy/ileostomy 09/29/2013 [facility]  -Secondary to mass at ileocecal valve with right colon perforation  -Status post ex lap, R-colectomy, repair of descending and sigmoid serosal tears and ileostomy--Dr. 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  Right colon perforation  -Repeat abdominal x-ray 10/06/13 showed increased free air  -10/06/2013 CT abdomen and pelvis shows moderate left effusion with atelectasis with right basilar opacity  -appreciate surgery followup-->plans noted for wound VAC  -continue soft diet  -mobilize  -Patient finished 7 days of ertapenem 10/06/13  Abdominal Wound Dehiscence  -wound VAC placed 10/09/13  -large amount of thin serous drainage from [facility] - concerning for fascial dehiscence  -Take down VAC in am 10/11/13 with possible need for reexploration and closure of his abdominal wall  -Case was discussed with Dr. Gershon Crane the day of discharge. He felt that the patient was stable for discharge with followup in the surgery office in one to 2 weeks. -Wound VAC to be changed on 10/14/13 and  then on Mon-Wed-Fridays -per Wound Care Nurse Recommendations--1 pc black foam. 153mmHg continuous pressure. Plan is for continuation of NPWT at the SNF. Next dressing change is due on Friday, 10/14/13.  Right basilar opacity  -Doubt pneumonia  -No fevers or respiratory distress or shortness of breath  -Patient finished 7 days of ertapenem 10/06/13  L1 vertebral fx/Ankylosing spondylitis/uncontrolled pain  -secondary to fall, managed non-operatively  -Seems to have some improvement with Robaxin  -Added long acting opioid--> increase OxyContin 20 mg every 12 hours-->pain improving  Adenocarcinoma of colon  -spoke with [facility]--Dr. Alen Blew who will set pt up with outpt followup  -Please call the [facility] [place] to confirm the patient's appointment -surgical path--adenocarcinoma, mod differentiation with METASTATIC CARCINOMA IN 5 OF 19 LYMPH NODES  Sepsis  -resolved  -due to peritonitis  -finished ertapenem  -remains hemodynamically stable and afebrile  -C. difficile PCR was negative. CAD/Afib  -no anticoagulation due to Hx GIB  -restarted amiodarone  -remains in sinus  -ASA 81 mg daily ESRD  -per nephrology  -HD on MWF  Chronic hypotension  -restarted amiodarone as bp is more stable  -continue midodrine  DM2  -Hemoglobin A1c 5.5 on 08/07/2013  -Discontinue CBG checks  Postop pain  -educated pt to try to [place] oxy IR first  -Dilauid for uncontrolled pain  -Add long acting opioid--> Increased OxyContin 20 mg every 12 hours-->pain improving  Acute blood loss anemia after surgery.  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  -f/u CBC-->Hgb stable  -transfuse for Hb < 7  -SCD for DVT prevention  Family Communication: Pt at beside  Disposition Plan: SNF   Discharge Condition:  stable  Disposition: SNF Follow-up Information   Follow up with [facility], MD. Schedule an appointment as soon as possible for a visit in 2 weeks. (Continue wound vac with changes on MWF.   Call if there is a problem with ostomy or wound vac.)    Specialty:  General Surgery   Contact information:   [place] [address] [phone]       Follow up with [facility], MD In 3 weeks.   Specialty:  Oncology   Contact information:   [address] [place] [address] [id number]       Diet:renal Wt Readings from Last 3 Encounters:  10/10/13 66.5 kg (146 lb 9.7 oz)  10/10/13 66.5 kg (146 lb 9.7 oz)  10/10/13 66.5 kg (146 lb 9.7 oz)    History of present illness:  73 yo white male with end-stage renal disease status post exploratory laparotomy for small bowel obstruction 09/29/13. 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 The patient with right colectomy and repair of descending and sigmoid serosal tears and ileostomy placement. The patient was on vasopressor support and postop mechanical ventilation and critical care was consulted. He was extubated on 09/30/2013. [facility] assumed care on 10/04/13. The patient continued to struggle with postoperative pain as well as pain from his L1 vertebral fracture. Addition of OxyContin and Robaxin has helped. The patient finished IV ertapenem for his peritonitis from bowel perforation. The patient experienced problems with abdominal wound dehiscence. A wound VAC was placed on the patient's abdominal wound. He continued to be evaluated by the surgical team. They felt that the risk was greater than benefit to take the patient back to the operating room. He favors conservative therapy for wound healing. He will followup with [facility] in the office in one to 2 weeks. He will continue wound VAC with dressing changes on Monday, Wednesday, Fridays. His next dressing change will be on 10/14/13. Due to the patient's leukocytosis and loose stool, C. difficile PCR was checked. It was negative. His leukocytosis was thought to be due to the  patient's acute medical condition. He had persistent leukocytosis throughout the hospitalization. However he remained afebrile and hemodynamically stable.      Consultants: Dr. Alen Blew (oncology)--telephone [facility] nephrology  Discharge Exam: Filed Vitals:   10/11/13 1000  BP: 91/53  Pulse: 89  Temp: 98.2 F (36.8 C)  Resp: 18   Filed Vitals:   10/10/13 1702 10/10/13 2046 10/11/13 0522 10/11/13 1000  BP: 111/52 94/60 96/68  91/53  Pulse: 89 90 80 89  Temp: 97.7 F (36.5 C) 98.4 F (36.9 C) 97.6 F (36.4 C) 98.2 F (36.8 C)  TempSrc: Oral Oral Oral Oral  Resp: 18 18 18 18   Height:  5\' 6"  (1.676 m)    Weight:  66.5 kg (146 lb 9.7 oz)    SpO2: 96% 92% 98% 99%   General: A&O x 3, NAD, pleasant, cooperative Cardiovascular: RRR, no rub, no gallop, no S3 Respiratory: CTAB, no wheeze, no rhonchi Abdomen:soft, nontender, nondistended, positive bowel sounds Extremities: No edema, No lymphangitis, no petechiae  Discharge Instructions      Discharge Orders   Future Appointments Provider Department Dept Phone   10/20/2013 10:30 AM [facility] [phone]   10/20/2013 10:45 AM [facility] [phone]   10/20/2013 11:00 AM  Wyatt Portela, MD [facility] [phone]   Future Orders Complete By Expires   Increase activity slowly  As directed        Medication List    STOP taking these medications       aspirin 325 MG tablet  Replaced by:  aspirin EC 81 MG tablet     DSS 100 MG Caps     vancomycin 50 mg/mL oral solution  Commonly known as:  VANCOCIN     zolpidem 10 MG tablet  Commonly known as:  AMBIEN      TAKE these medications       acetaminophen 500 MG tablet  Commonly known as:  TYLENOL  Take 500 mg by mouth every 4 (four) hours as needed (pain).     allopurinol 100 MG tablet  Commonly known as:   ZYLOPRIM  Take 1 tablet (100 mg total) by mouth daily.     amiodarone 200 MG tablet  Commonly known as:  PACERONE  Take 200 mg by mouth daily.     aspirin EC 81 MG tablet  Take 1 tablet (81 mg total) by mouth daily.     bisacodyl 10 MG suppository  Commonly known as:  DULCOLAX  Place 1 suppository (10 mg total) rectally daily as needed for moderate constipation (gas pain).     calcitonin (salmon) 200 UNIT/ACT nasal spray  Commonly known as:  MIACALCIN/FORTICAL  Place 1 spray into alternate nostrils daily as needed.     darbepoetin 60 MCG/0.3ML Soln injection  Commonly known as:  ARANESP  Inject 0.3 mLs (60 mcg total) into the vein every Monday with hemodialysis.     doxercalciferol 4 MCG/2ML injection  Commonly known as:  HECTOROL  Inject 1 mL (2 mcg total) into the vein every Monday, Wednesday, and Friday with hemodialysis.     feeding supplement (RESOURCE BREEZE) Liqd  Take 1 Container by mouth daily.     feeding supplement (NEPRO CARB STEADY) Liqd  Take 237 mLs by mouth 2 (two) times daily.     fish oil-omega-3 fatty acids 1000 MG capsule  Take 1 g by mouth 2 (two) times daily.     gabapentin 300 MG capsule  Commonly known as:  NEURONTIN  Take 1 capsule (300 mg total) by mouth 2 (two) times daily.     HYDROcodone-acetaminophen 5-325 MG per tablet  Commonly known as:  NORCO/VICODIN  Take 1 tablet by mouth 2 (two) times daily.     insulin aspart 100 UNIT/ML injection  Commonly known as:  novoLOG  Inject 0-9 Units into the skin 3 (three) times daily with meals.     ipratropium 0.06 % nasal spray  Commonly known as:  ATROVENT  Place 2 sprays into both nostrils 3 (three) times daily.     lidocaine 5 %  Commonly known as:  LIDODERM  Place 1 patch onto the skin daily. Remove & Discard patch within 12 hours or as directed by MD     magnesium hydroxide 400 MG/5ML suspension  Commonly known as:  MILK OF MAGNESIA  Take by mouth every 12 (twelve) hours as needed for  mild constipation.     methocarbamol 750 MG tablet  Commonly known as:  ROBAXIN  Take 1 tablet (750 mg total) by mouth every 6 (six) hours as needed for muscle spasms.     metoCLOPramide 5 MG tablet  Commonly known as:  REGLAN  Take 5 mg by mouth 3 (three) times daily.  midodrine 5 MG tablet  Commonly known as:  PROAMATINE  Take 5 mg by mouth 3 (three) times a week. Take once daily in the morning on dialysis days (usually on Monday, Wednesday and Friday)     multivitamin Tabs tablet  Take 1 tablet by mouth at bedtime.     omeprazole 20 MG capsule  Commonly known as:  PRILOSEC  Take 20 mg by mouth 2 (two) times daily.     oxyCODONE 5 MG immediate release tablet  Commonly known as:  Oxy IR/ROXICODONE  Take 1-2 tablets (5-10 mg total) by mouth every 4 (four) hours as needed for moderate pain or severe pain.     OxyCODONE 20 mg T12a 12 hr tablet  Commonly known as:  OXYCONTIN  Take 1 tablet (20 mg total) by mouth every 12 (twelve) hours.     pantoprazole 40 MG tablet  Commonly known as:  PROTONIX  Take 40 mg by mouth daily.     polyethylene glycol packet  Commonly known as:  MIRALAX / GLYCOLAX  Take 17 g by mouth daily.     PREPARATION H EX  Apply 1 application topically every 6 (six) hours as needed (hemorrhoids).     promethazine 25 MG tablet  Commonly known as:  PHENERGAN  Take 25 mg by mouth every 12 (twelve) hours as needed for nausea or vomiting.     saccharomyces boulardii 250 MG capsule  Commonly known as:  FLORASTOR  Take 1 capsule (250 mg total) by mouth 2 (two) times daily.     senna 8.6 MG Tabs tablet  Commonly known as:  SENOKOT  Take 1 tablet by mouth 2 (two) times daily.     sevelamer carbonate 800 MG tablet  Commonly known as:  RENVELA  Take 800 mg by mouth 3 (three) times daily with meals.     simethicone 80 MG chewable tablet  Commonly known as:  MYLICON  Chew 2 tablets (160 mg total) by mouth 4 (four) times daily as needed for flatulence.      sodium chloride 0.9 % SOLN 100 mL with ferric gluconate 12.5 MG/ML SOLN 62.5 mg  Inject 62.5 mg into the vein every Monday, Wednesday, and Friday with hemodialysis.     sorbitol 70 % solution  Take 15 mLs by mouth daily as needed.     traMADol 50 MG tablet  Commonly known as:  ULTRAM  Take 1 tablet (50 mg total) by mouth every 12 (twelve) hours.         The results of significant diagnostics from this hospitalization (including imaging, microbiology, ancillary and laboratory) are listed below for reference.    Significant Diagnostic Studies: Ct Abdomen Pelvis Wo Contrast  10/06/2013   CLINICAL DATA:  Status post colectomy for colon cancer.  EXAM: CT ABDOMEN AND PELVIS WITHOUT CONTRAST  TECHNIQUE: Multidetector CT imaging of the abdomen and pelvis was performed following the standard protocol without intravenous contrast.  COMPARISON:  CT scan of September 25, 2013.  FINDINGS: L1 compression fracture is again noted. Moderate left pleural effusion is noted with left lower lobe atelectasis. Status post bilateral hip arthroplasties. Mild right basilar opacity is noted concerning for pneumonia or atelectasis with associated pleural effusion.  No focal abnormality is seen in the liver, spleen or pancreas on these unenhanced images. Dense sludge is noted in the gallbladder. Fluid is noted around the liver and spleen, as well as in the pelvis consistent with ascites. Colostomy is noted in the right lower quadrant and  patient is status post right colectomy. Mild pneumoperitoneum is noted in the epigastric region. Midline surgical incision is noted. Small bowel dilatation is noted which may represent postoperative ileus. Bilateral renal atrophy is noted. Stable rounded and exophytic hyperdense and hypodense abnormalities are seen involving both kidneys most likely representing cysts. Renal ultrasound may be performed for further evaluation. Evaluation of the pelvis is limited due to scatter artifact from  previously described bilateral hip arthroplasties. Atherosclerotic calcifications of abdominal aorta are noted without aneurysm formation. Adrenal glands appear normal.  IMPRESSION: L1 compression fracture is again noted.  Moderate left pleural effusion is noted with left lower lobe atelectasis. Mild right basilar opacity is noted concerning for pneumonia or atelectasis.  Mild ascites is noted.  Status post right hemicolectomy with colostomy seen in right lower quadrant. Mildly dilated small bowel loops are noted most consistent with postoperative ileus, but distal obstruction cannot be excluded.  Stable rounded and exophytic hyperdense and hypodense abnormalities are seen involving both kidneys which may represent cysts; renal ultrasound is recommended for further evaluation.  Bilateral renal atrophy is noted.  Mild pneumoperitoneum is noted in the epigastric region most likely postoperative in nature.   Electronically Signed   By: Sabino Dick M.D.   On: 10/06/2013 21:16   Ct Abdomen Pelvis Wo Contrast  09/25/2013   CLINICAL DATA:  Vomiting, abdominal distention, constipation  EXAM: CT ABDOMEN AND PELVIS WITHOUT CONTRAST  TECHNIQUE: Multidetector CT imaging of the abdomen and pelvis was performed following the standard protocol without intravenous contrast.  COMPARISON:  None.  FINDINGS: There are multiple dilated small bowel loops throughout the abdomen and pelvis. In the right mid abdomen, there is a small bowel loop with the extensive bowel content and crecent air anteriorly; this is more likely due to air surrounding stool and less likely due to pneumatosis. There is bowel wall thickening in decompressed ascending, transverse and descending colon.  The liver, spleen, pancreas are normal. The gallbladder is distended. The adrenal glands are normal. The kidneys are atrophic. There are nonobstructing left kidney stones. There are small high and low density lesions in both kidneys, evaluation is limited without  contrast. There is atherosclerosis of the abdominal aorta without aneurysmal dilatation. There is no abdominal lymphadenopathy.  Evaluation is decompressed bladder is limited by metallic artifact from the bilateral hip replacements. There is a small left pleural effusion. There is compression atelectasis of the bilateral posterior lung bases. There are patchy airspace consolidation the of the posterior right upper lobe suspicious for pneumonia.  IMPRESSION: Findings consistent with small bowel obstruction. In the right mid abdomen, there is a small bowel loop with the extensive bowel content and crecent air anteriorly; this is more likely due to air surrounding stool and less likely due to pneumatosis. Clinical correlation with lactose level is recommended.  Colonic bowel wall thickening of ascending, transverse, and descending colon. This is in part due to decompressed colon. However, colitis or ischemic colon are not excluded on this noncontrast exam.  Right upper lobe pneumonia.   Electronically Signed   By: Abelardo Diesel M.D.   On: 09/25/2013 17:56   Ir Fluoro Guide Cv Line Right  09/28/2013   CLINICAL DATA:  Non functional left arm AV dialysis fistula, not amenable to percutaneous intervention. Dialysis access needed.  EXAM: TUNNELED HEMODIALYSIS CATHETER PLACEMENT WITH ULTRASOUND AND FLUOROSCOPIC GUIDANCE  TECHNIQUE: The procedure, risks, benefits, and alternatives were explained to the patient. Questions regarding the procedure were encouraged and answered. The patient understands and  consents to the procedure. As antibiotic prophylaxis, cefazolin 1 g was ordered pre-procedure and administered intravenously within one hour of incision.Patency of the right IJ vein was confirmed with ultrasound with image documentation. An appropriate skin site was determined. Region was prepped using maximum barrier technique including cap and mask, sterile gown, sterile gloves, large sterile sheet, and Chlorhexidine as  cutaneous antisepsis. The region was infiltrated locally with 1% lidocaine.  Intravenous Fentanyl and Versed were administered as conscious sedation during continuous cardiorespiratory monitoring by the radiology RN, with a total moderate sedation time of 80 minutes.  Under real-time ultrasound guidance, the right IJ vein was accessed with a 21 gauge micropuncture needle; the needle tip within the vein was confirmed with ultrasound image documentation. Needle exchanged over the 018 guidewire for transitional dilator, which allowed advancement of a Benson wire into the IVC. Over this, an MPA catheter was advanced. A Hemosplit 19 hemodialysis catheter was tunneled from the right anterior chest wall approach to the right IJ dermatotomy site. The MPA catheter was exchanged over an Amplatz wire for serial vascular dilators which allow placement of a peel-away sheath, through which the catheter was advanced under intermittent fluoroscopy, positioned with its tips in the proximal and midright atrium. Spot chest radiograph confirms good catheter position. No pneumothorax. Catheter was flushed and primed per protocol. Catheter secured externally with O Prolene sutures. The right IJ dermatotomy site was closed with 3-0 Monocryl subcutaneous suture and covered with Dermabond. No immediate complication.  COMPARISON:  None  FLUOROSCOPY TIME:  10 min 12 seconds  ACCESS: Remains approachable for percutaneous intervention as needed.  IMPRESSION: 1. Technically successful placement of tunneled right IJ hemodialysis catheter with ultrasound and fluoroscopic guidance. Ready for routine use.   Electronically Signed   By: Arne Cleveland M.D.   On: 09/28/2013 14:10   Ir Angio Av Shunt Addl Access  09/28/2013   CLINICAL DATA:  End stage renal disease, poor function of left arm dialysis fistula.  TECHNIQUE: DIALYSIS SHUNTOGRAM  The procedure, risks (including but not limited to bleeding, infection, organ damage ), benefits, and  alternatives were explained to the patient. Questions regarding the procedure were encouraged and answered. The patient understands and consents to the procedure.  In preparation for possible declot procedure, the skin overlying the left forearm fistula was prepped with Betadine, draped in usual sterile fashion, infiltrated locally with 1% lidocaine.  Intravenous Fentanyl and Versed were administered as conscious sedation during continuous cardiorespiratory monitoring by the radiology RN, with a total moderate sedation time of eighty minutes.  The outflow vein from the fistula was accessed just central to the fistula with a 21 gauge micropuncture needle under real-time ultrasound guidance. Needle was exchanged over a 018 guide wire for a 4 French transitional dilator. This was used for outflow venography.  The outflow vein was then accessed more centrally retrograde with a 21 gauge micropuncture needle under real-time ultrasound guidance. The needle was exchanged over the 018 wire for the transitional dilator, which allow placement of a Bentson wire. Over this, a 5 [place] Kumpe catheter was advanced and used to negotiate the arterial anastomosis for additional fistulography. Attempts were made to catheterize the proximal radial artery using a variety of catheter & guidewire combinations but these were ultimately unsuccessful. Assessment of the arterial anatomy of the forearm was performed with a blood pressure cuff at the upper arm level.  Ultimately, No declot procedure was needed. The access sites were removed and hemostasis achieved with manual compression.  No  immediate complication.  COMPARISON:  None  FLUOROSCOPY TIME:  10 min 12 seconds  ACCESS: Proximal left radial artery occlusion. Not amenable to percutaneous intervention. Consider surgical consultation.  FINDINGS: The outflow cephalic vein from the fistula is widely patent. Central venous system through the SVC is patent. There is a competing outflow vein  at the level of the midforearm. There is stenosis at the arterial anastomosis. The proximal radial artery is occluded, not opacified central to the level of the fistula. There is retrograde flow in the distal radial artery supplied by palmar arches, supplying low-grade flow across the fistula.  IMPRESSION: 1. Proximal left radial artery occlusion. Collateral flow via palmar arches reconstitutes the distal radial artery, with retrograde flow into the fistula, at an inadequate rate. This is not amenable to percutaneous intervention. After telephone consultation with Dr. Jonnie Finner, plan made to proceed with hemodialysis catheter placement.   Electronically Signed   By: Arne Cleveland M.D.   On: 09/28/2013 14:17   Ir US Guide Vasc Access Left  09/28/2013   CLINICAL DATA:  End stage renal disease, poor function of left arm dialysis fistula.  TECHNIQUE: DIALYSIS SHUNTOGRAM  The procedure, risks (including but not limited to bleeding, infection, organ damage ), benefits, and alternatives were explained to the patient. Questions regarding the procedure were encouraged and answered. The patient understands and consents to the procedure.  In preparation for possible declot procedure, the skin overlying the left forearm fistula was prepped with Betadine, draped in usual sterile fashion, infiltrated locally with 1% lidocaine.  Intravenous Fentanyl and Versed were administered as conscious sedation during continuous cardiorespiratory monitoring by the radiology RN, with a total moderate sedation time of eighty minutes.  The outflow vein from the fistula was accessed just central to the fistula with a 21 gauge micropuncture needle under real-time ultrasound guidance. Needle was exchanged over a 018 guide wire for a 4 French transitional dilator. This was used for outflow venography.  The outflow vein was then accessed more centrally retrograde with a 21 gauge micropuncture needle under real-time ultrasound guidance. The needle  was exchanged over the 018 wire for the transitional dilator, which allow placement of a Bentson wire. Over this, a 5 [place] Kumpe catheter was advanced and used to negotiate the arterial anastomosis for additional fistulography. Attempts were made to catheterize the proximal radial artery using a variety of catheter & guidewire combinations but these were ultimately unsuccessful. Assessment of the arterial anatomy of the forearm was performed with a blood pressure cuff at the upper arm level.  Ultimately, No declot procedure was needed. The access sites were removed and hemostasis achieved with manual compression.  No immediate complication.  COMPARISON:  None  FLUOROSCOPY TIME:  10 min 12 seconds  ACCESS: Proximal left radial artery occlusion. Not amenable to percutaneous intervention. Consider surgical consultation.  FINDINGS: The outflow cephalic vein from the fistula is widely patent. Central venous system through the SVC is patent. There is a competing outflow vein at the level of the midforearm. There is stenosis at the arterial anastomosis. The proximal radial artery is occluded, not opacified central to the level of the fistula. There is retrograde flow in the distal radial artery supplied by palmar arches, supplying low-grade flow across the fistula.  IMPRESSION: 1. Proximal left radial artery occlusion. Collateral flow via palmar arches reconstitutes the distal radial artery, with retrograde flow into the fistula, at an inadequate rate. This is not amenable to percutaneous intervention. After telephone consultation with Dr. Jonnie Finner, plan  made to proceed with hemodialysis catheter placement.   Electronically Signed   By: Arne Cleveland M.D.   On: 09/28/2013 14:17   Ir US Guide Vasc Access Right  09/28/2013   CLINICAL DATA:  Non functional left arm AV dialysis fistula, not amenable to percutaneous intervention. Dialysis access needed.  EXAM: TUNNELED HEMODIALYSIS CATHETER PLACEMENT WITH ULTRASOUND AND  FLUOROSCOPIC GUIDANCE  TECHNIQUE: The procedure, risks, benefits, and alternatives were explained to the patient. Questions regarding the procedure were encouraged and answered. The patient understands and consents to the procedure. As antibiotic prophylaxis, cefazolin 1 g was ordered pre-procedure and administered intravenously within one hour of incision.Patency of the right IJ vein was confirmed with ultrasound with image documentation. An appropriate skin site was determined. Region was prepped using maximum barrier technique including cap and mask, sterile gown, sterile gloves, large sterile sheet, and Chlorhexidine as cutaneous antisepsis. The region was infiltrated locally with 1% lidocaine.  Intravenous Fentanyl and Versed were administered as conscious sedation during continuous cardiorespiratory monitoring by the radiology RN, with a total moderate sedation time of 80 minutes.  Under real-time ultrasound guidance, the right IJ vein was accessed with a 21 gauge micropuncture needle; the needle tip within the vein was confirmed with ultrasound image documentation. Needle exchanged over the 018 guidewire for transitional dilator, which allowed advancement of a Benson wire into the IVC. Over this, an MPA catheter was advanced. A Hemosplit 19 hemodialysis catheter was tunneled from the right anterior chest wall approach to the right IJ dermatotomy site. The MPA catheter was exchanged over an Amplatz wire for serial vascular dilators which allow placement of a peel-away sheath, through which the catheter was advanced under intermittent fluoroscopy, positioned with its tips in the proximal and midright atrium. Spot chest radiograph confirms good catheter position. No pneumothorax. Catheter was flushed and primed per protocol. Catheter secured externally with O Prolene sutures. The right IJ dermatotomy site was closed with 3-0 Monocryl subcutaneous suture and covered with Dermabond. No immediate complication.   COMPARISON:  None  FLUOROSCOPY TIME:  10 min 12 seconds  ACCESS: Remains approachable for percutaneous intervention as needed.  IMPRESSION: 1. Technically successful placement of tunneled right IJ hemodialysis catheter with ultrasound and fluoroscopic guidance. Ready for routine use.   Electronically Signed   By: Arne Cleveland M.D.   On: 09/28/2013 14:10   Dg Chest Port 1 View  10/01/2013   CLINICAL DATA:  Atelectasis.  EXAM: PORTABLE CHEST - 1 VIEW  COMPARISON:  Chest radiograph 09/30/2013.  FINDINGS: Right-sided hemodialysis catheter unchanged. Left-sided central venous catheter tip projects over the superior cavoatrial junction. Interval extubation. NG tube tip and side-port project over the left upper quadrant. Stable cardiac and mediastinal contours. Small bilateral pleural effusions with underlying opacities likely representing atelectasis.  IMPRESSION: Marked gaseous distention of the visualized upper abdominal bowel loops, recommend correlation with dedicated abdominal radiography.  Interval extubation.  Otherwise stable support apparatus.  Small bilateral pleural effusions.  Underlying atelectasis.   Electronically Signed   By: Lovey Newcomer M.D.   On: 10/01/2013 08:08   Dg Chest Port 1 View  09/30/2013   CLINICAL DATA:  Central line placement  EXAM: PORTABLE CHEST - 1 VIEW  COMPARISON:  Air radiograph 09/29/2013  FINDINGS: There has been interval placement of a left IJ central venous catheter with tip overlying the cavoatrial junction. Tip of the endotracheal tube is positioned 3.6 cm above the carina. Right-sided hemodialysis catheter is unchanged.  Sequelae of prior CABG again noted.  Lungs are hypoinflated. Bilateral pleural effusions, left greater than right, are slightly improved relative to prior study. Associated bibasilar atelectasis is present. No new focal infiltrate. No overt pulmonary edema. No pneumothorax.  Osseous structures are unchanged.  IMPRESSION: 1. Tip of the left IJ central  venous catheter overlying the cavoatrial junction. Remaining support apparatus as above. 2. Slight interval decrease in size of bilateral pleural effusions, left greater than right, with similar bibasilar atelectasis.   Electronically Signed   By: Jeannine Boga M.D.   On: 09/30/2013 03:23   Dg Chest Port 1 View  09/29/2013   CLINICAL DATA:  Status post right partial colectomy  EXAM: PORTABLE CHEST - 1 VIEW  COMPARISON:  02/17/2013  FINDINGS: Cardiac shadow remains mildly enlarged. A dialysis catheter is seen in satisfactory position. An endotracheal tube is now noted 3.8 cm above the carina. A nasogastric catheter extends into the stomach. Bilateral pleural effusions left greater than right are seen. The left effusion appears chronic in nature when compare with the previous exam from multiple months previous. No new focal abnormality is seen.  IMPRESSION: Tubes and lines as described.  Pleural effusions left greater than right. The left effusion appears chronic in nature.   Electronically Signed   By: Inez Catalina M.D.   On: 09/29/2013 18:00   Dg Abd 2 Views  09/28/2013   CLINICAL DATA:  Small bowel obstruction. Abdominal distention. NG tube placement.  EXAM: ABDOMEN - 2 VIEW  COMPARISON:  09/27/2013.  FINDINGS: Supine and left lateral decubitus views of the abdomen again demonstrate diffuse dilatation of the small bowel loops in the central abdomen measuring up to 4.3 cm in diameter. Multiple air-fluid levels are noted on the left lateral decubitus view. No frank pneumoperitoneum. Paucity of distal colonic gas, although there is a small amount of rectal gas present. Nasogastric tube appears coiled in the stomach. 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  IMPRESSION: 1. Bowel gas pattern remains compatible with a small bowel obstruction. 2. No pneumoperitoneum at this time. 3. Nasogastric tube remains coiled in the stomach.    Electronically Signed   By: Vinnie Langton M.D.   On: 09/28/2013 10:38   Dg Abd 2 Views  09/27/2013   CLINICAL DATA:  Mid abdominal pain, back pain  EXAM: ABDOMEN - 2 VIEW  COMPARISON:  DG ABD PORTABLE 1V dated 09/26/2013  FINDINGS: Multiple dilated loops small bowel appreciated throughout the abdomen. Air-fluid levels identified. There is no evidence of free air. A moderate amount of stools appreciated region of the ascending colon. There is a paucity of distal bowel gas. The NG tube is seen with tip curled in the region of the stomach. When compared to previous study the dilated loops of small bowel have decreased in size and distribution.  IMPRESSION: Findings consistent with an improving small bowel obstruction. There is no evidence of free air.   Electronically Signed   By: Margaree Mackintosh M.D.   On: 09/27/2013 13:27   Dg Abd Portable 1v  10/02/2013   CLINICAL DATA:  Evaluate for resolution of ileus  EXAM: PORTABLE ABDOMEN - 1 VIEW  COMPARISON:  DG ABD PORTABLE 1V dated 10/01/2013; DG ABD PORTABLE 2V dated 09/29/2013; CT ABD/PELV WO CM dated 09/25/2013  FINDINGS: There is a paucity of bowel gas. There is persistent moderate gas distention of several mildly dilated loops of rather featureless appearing small bowel within the left mid hemi abdomen though there is decreased  gaseous distention of these bowel loops. A minimal amount of air is seen within the ascending and descending colon.  Nondiagnostic evaluation pneumoperitoneum. An enteric tube and side port overlie the expected location of the mid body of the stomach.  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 Post bilateral total hip replacements, incompletely imaged.  IMPRESSION: 1. Findings most suggestive of improving ileus, though note, there is persistent mild gas distention of several featureless loops of small bowel within the mid abdomen. Clinical correlation is advised. 2. Sequela of ankylosing  spondylitis   Electronically Signed   By: Simonne Come M.D.   On: 10/02/2013 08:21   Dg Abd Portable 1v  10/01/2013   CLINICAL DATA:  Distension.  Recent hemicolectomy with colostomy.  EXAM: PORTABLE ABDOMEN - 1 VIEW  COMPARISON:  09/29/2013 and 09/28/2013  FINDINGS: Central venous catheter unchanged with tip over the right atrium. Nasogastric tube is present with tip over the midline upper abdomen likely over the distal stomach. There is continued evidence of multiple air-filled dilated small bowel loops. There is minimal air within the colon. There is continued evidence of an unusual air collection in the right upper quadrant as cannot exclude free peritoneal air as seen on the prior study. Remainder of the exam is unchanged.  IMPRESSION: Persistent air-filled dilated small bowel loops with paucity of colonic gas. Findings suggesting small bowel obstructive process. Persistent unusual air collection over the right upper quadrant as this may represent free peritoneal air as seen previously likely due to patient's recent hemicolectomy.  Nasogastric tube with tip over the mid abdomen likely in the distal stomach.  Findings discussed with patient's nurse, Lyla Son, at the time of dictation.   Electronically Signed   By: Elberta Fortis M.D.   On: 10/01/2013 14:56   Dg Abd Portable 1v  09/26/2013   CLINICAL DATA:  Small bowel obstruction, abdominal pain and distention  EXAM: PORTABLE ABDOMEN - 1 VIEW  COMPARISON:  Prior abdominal radiograph 09/25/2013  FINDINGS: Persistent gaseous distension of the stomach and multiple loops of small bowel. The tip of the nasogastric tube projects over the gastric bubble, however the proximal side hole is in the region of the gastroesophageal junction. The degree of small-bowel distention is not significantly changed since the prior study. Maximal small bowel diameter is again 5 cm. No large free air on this single supine radiograph. Surgical changes of prior median sternotomy and  bilateral total hip arthroplasties. The bones are osteopenic. Atherosclerotic vascular calcifications noted in the aorta.  IMPRESSION: 1. No significant interval change in the appearance of high-grade small bowel obstruction. Maximal small bowel diameter is again 5 cm. 2. The proximal side port of the nasogastric tube is in the GE junction. Consider advancing 5 cm for more optimal placement.   Electronically Signed   By: Malachy Moan M.D.   On: 09/26/2013 07:35   Dg Abd Portable 1v  09/25/2013   CLINICAL DATA:  Nasogastric tube placement  EXAM: PORTABLE ABDOMEN - 1 VIEW  COMPARISON:  CT abdomen September 25, 2013  FINDINGS: Nasogastric tube is identified with distal tip in the mid stomach. The stomach is air-filled and distended. There are multiple dilated small bowel loops.  IMPRESSION: Naso gastric tube distal tip in the mid stomach. Small bowel obstruction.   Electronically Signed   By: Sherian Rein M.D.   On: 09/25/2013 22:56   Dg Abd Portable 2v  10/06/2013   CLINICAL DATA:  Severe abdominal distention and pain.  Postop from right hemicolectomy on 09/29/2013.  EXAM: PORTABLE ABDOMEN - 2 VIEW  COMPARISON:  10/02/2013  FINDINGS: Increased dilatation of small bowel loops seen. A moderate amount of free intraperitoneal air is also demonstrated and appears increased since previous study. Previous seen nasogastric tube is no longer visualized.  IMPRESSION: Increased small bowel dilatation and free intraperitoneal air since prior study, highly suspicious for anastomotic leak or perforated viscus.  Critical Value/emergent results were called by telephone at the time of interpretation on 10/06/2013 at 2:01 PM to the patient's floor nurse Jonni Sanger, who verbally acknowledged these results.   Electronically Signed   By: Earle Gell M.D.   On: 10/06/2013 14:04   Dg Abd Portable 2v  09/29/2013   CLINICAL DATA:  Status post colonoscopy. Question free intraperitoneal air.  EXAM: PORTABLE ABDOMEN - 2 VIEW  COMPARISON:  CT  abdomen and pelvis 09/25/2013.  FINDINGS: There is a large volume of free intraperitoneal air. NG tube is in place with tip in the stomach.  IMPRESSION: Study is positive for free intraperitoneal air.  Critical Value/emergent results were called by telephone at the time of interpretation on 09/29/2013 at 12:27 PM to Dr. Ronald Lobo , who verbally acknowledged these results.   Electronically Signed   By: Inge Rise M.D.   On: 09/29/2013 12:30   Ir Shuntogram/ Fistulagram Left Mod Sed  09/28/2013   CLINICAL DATA:  End stage renal disease, poor function of left arm dialysis fistula.  TECHNIQUE: DIALYSIS SHUNTOGRAM  The procedure, risks (including but not limited to bleeding, infection, organ damage ), benefits, and alternatives were explained to the patient. Questions regarding the procedure were encouraged and answered. The patient understands and consents to the procedure.  In preparation for possible declot procedure, the skin overlying the left forearm fistula was prepped with Betadine, draped in usual sterile fashion, infiltrated locally with 1% lidocaine.  Intravenous Fentanyl and Versed were administered as conscious sedation during continuous cardiorespiratory monitoring by the radiology RN, with a total moderate sedation time of eighty minutes.  The outflow vein from the fistula was accessed just central to the fistula with a 21 gauge micropuncture needle under real-time ultrasound guidance. Needle was exchanged over a 018 guide wire for a 4 French transitional dilator. This was used for outflow venography.  The outflow vein was then accessed more centrally retrograde with a 21 gauge micropuncture needle under real-time ultrasound guidance. The needle was exchanged over the 018 wire for the transitional dilator, which allow placement of a Bentson wire. Over this, a 5 [place] Kumpe catheter was advanced and used to negotiate the arterial anastomosis for additional fistulography. Attempts were made to  catheterize the proximal radial artery using a variety of catheter & guidewire combinations but these were ultimately unsuccessful. Assessment of the arterial anatomy of the forearm was performed with a blood pressure cuff at the upper arm level.  Ultimately, No declot procedure was needed. The access sites were removed and hemostasis achieved with manual compression.  No immediate complication.  COMPARISON:  None  FLUOROSCOPY TIME:  10 min 12 seconds  ACCESS: Proximal left radial artery occlusion. Not amenable to percutaneous intervention. Consider surgical consultation.  FINDINGS: The outflow cephalic vein from the fistula is widely patent. Central venous system through the SVC is patent. There is a competing outflow vein at the level of the midforearm. There is stenosis at the arterial anastomosis. The proximal radial artery is occluded, not opacified central to the level of the fistula. There is retrograde  flow in the distal radial artery supplied by palmar arches, supplying low-grade flow across the fistula.  IMPRESSION: 1. Proximal left radial artery occlusion. Collateral flow via palmar arches reconstitutes the distal radial artery, with retrograde flow into the fistula, at an inadequate rate. This is not amenable to percutaneous intervention. After telephone consultation with Dr. Jonnie Finner, plan made to proceed with hemodialysis catheter placement.   Electronically Signed   By: Arne Cleveland M.D.   On: 09/28/2013 14:17     Microbiology: Recent Results (from the past 240 hour(s))  CLOSTRIDIUM DIFFICILE BY PCR     Status: None   Collection Time    10/04/13 10:17 AM      Result Value Ref Range Status   C difficile by pcr NEGATIVE  NEGATIVE Final  CLOSTRIDIUM DIFFICILE BY PCR     Status: None   Collection Time    10/11/13  9:33 AM      Result Value Ref Range Status   C difficile by pcr NEGATIVE  NEGATIVE Final     Labs: Basic Metabolic Panel:  Recent Labs Lab 10/05/13 0540 10/07/13 0658  10/10/13 1342  NA 141 138 136*  K 3.8 4.2 4.4  CL 104 99 99  CO2 26 28 23   GLUCOSE 106* 70 114*  BUN 16 14 19   CREATININE 2.57* 2.44* 3.51*  CALCIUM 7.5* 7.6* 7.5*  PHOS 3.2 3.3 3.1   Liver Function Tests:  Recent Labs Lab 10/05/13 0540 10/07/13 0658 10/10/13 1342  ALBUMIN 1.4* 1.5* 1.4*   No results found for this basename: LIPASE, AMYLASE,  in the last 168 hours No results found for this basename: AMMONIA,  in the last 168 hours CBC:  Recent Labs Lab 10/06/13 0525 10/07/13 0658 10/08/13 0405 10/10/13 1342 10/11/13 0100  WBC 14.3* 14.6* 11.6* 20.2* 18.3*  HGB 9.2* 9.6* 9.7* 10.0* 8.9*  HCT 29.4* 30.6* 31.6* 32.1* 28.7*  MCV 96.7 97.1 97.8 98.5 100.7*  PLT 363 411* 360 389 258   Cardiac Enzymes: No results found for this basename: CKTOTAL, CKMB, CKMBINDEX, TROPONINI,  in the last 168 hours BNP: No components found with this basename: POCBNP,  CBG:  Recent Labs Lab 10/10/13 1149 10/10/13 1745 10/10/13 2044 10/11/13 0754 10/11/13 1203  GLUCAP 114* 88 76 118* 171*    Time coordinating discharge:  Greater than 30 minutes  Signed:  Jeremi Losito, DO [facility] Pager: [phone] 10/11/2013, 12:38 PM

## 2013-10-11 NOTE — Clinical Social Work Note (Signed)
Patient medically stable for discharge today back to [facility] in [place]. Facility notified and advised that patient will need a wound vac. CSW contacted later by admissions director, [name] and advised that they cannot get wound vac until Wednesday. MD notified and indicated that patient can discharge Wednesday after dialysis.  Shronda Boeh Givens, MSW, LCSW [phone]

## 2013-10-11 NOTE — Progress Notes (Signed)
[facility] Progress Note   Subjective: no complaints  Filed Vitals:   10/10/13 1630 10/10/13 1702 10/10/13 2046 10/11/13 0522  BP: 82/51 111/52 94/60 96/68   Pulse: 84 89 90 80  Temp:  97.7 F (36.5 C) 98.4 F (36.9 C) 97.6 F (36.4 C)  TempSrc:  Oral Oral Oral  Resp: 18 18 18 18   Height:   5\' 6"  (1.676 m)   Weight:   66.5 kg (146 lb 9.7 oz)   SpO2:  96% 92% 98%  Exam Alert, pale elderly male, no distress, calm No jvd Chest clear bilat RRR no MRG Abd soft, midline vac in place, RLQ ostomy No leg or UE edema Neuro is nf, ox3, gen weakness R IJ tunneled HD cath in place   Dialysis: [facility] 4h   69kg   2K/2.25 [place]  Heparin tight postop (was on 5000)   R IJ Cath (L AVF failed this admit)  Hectorol 2     Epo 5000     Venofer 50/wk, started this admit Recent lab: Hb 9.9  pth 296   Assessment: 1 Invasive colon Ca 2 Bowel obstruction s/p colectomy/ ileostomy 3 Wound dehiscence, s/p vac 4 ESRD on HD, tight heparin 5 Recent L1 vertebral body fx- nonsurg Rx 6 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, 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 8 Anemia- darbe 150/wk 9 HTN/vol- chronic low BP's on midodrine, no vol excess, lowered dry wt ~66-67kg 10 Afib/CAD- no AC due to GIB, po amio 11 Dispo- back to snf tomorrow   Plan- HD in am, tight hep, back to SNF tomorrow is plan    Kelly Splinter MD  pager [phone]    cell [phone]  10/11/2013, 9:54 AM     Recent Labs Lab 10/05/13 0540 10/07/13 0658 10/10/13 1342  NA 141 138 136*  K 3.8 4.2 4.4  CL 104 99 99  CO2 26 28 23   GLUCOSE 106* 70 114*  BUN 16 14 19   CREATININE 2.57* 2.44* 3.51*  CALCIUM 7.5* 7.6* 7.5*  PHOS 3.2 3.3 3.1    Recent Labs Lab 10/05/13 0540 10/07/13 0658 10/10/13 1342  ALBUMIN 1.4* 1.5* 1.4*    Recent Labs Lab 10/08/13 0405 10/10/13 1342 10/11/13 0100  WBC 11.6* 20.2* 18.3*  HGB 9.7* 10.0* 8.9*  HCT 31.6* 32.1* 28.7*  MCV 97.8 98.5 100.7*  PLT 360 389 258   . acetaminophen   1,000 mg Oral TID  . amiodarone  200 mg Oral Daily  . antiseptic oral rinse  1 application Mouth Rinse QID  . calcitonin (salmon)  1 spray Alternating Nares Daily  . chlorhexidine  15 mL Mouth/Throat BID  . darbepoetin  150 mcg Intravenous Q Mon-HD  . doxercalciferol  2 mcg Intravenous Q M,W,F-HD  . feeding supplement (NEPRO CARB STEADY)  237 mL Oral Q24H  . feeding supplement (RESOURCE BREEZE)  1 Container Oral BID BM  . ferric gluconate (FERRLECIT/NULECIT) IV  62.5 mg Intravenous Q Fri-HD  . gabapentin  300 mg Oral Daily  . hydrALAZINE  10 mg Oral Once  . lidocaine  1 patch Transdermal Q24H  . midodrine  10 mg Oral BID WC  . multivitamin  1 tablet Oral QHS  . OxyCODONE  20 mg Oral Q12H  . pantoprazole  40 mg Oral Q1200  . psyllium  1 packet Oral BID  . traMADol  50 mg Oral Q12H   . sodium chloride Stopped (10/03/13 1300)   bismuth subsalicylate, HYDROmorphone (DILAUDID) injection, loperamide,  methocarbamol, oxyCODONE, promethazine, simethicone

## 2013-10-11 NOTE — Progress Notes (Signed)
Physical Therapy Treatment Patient Details Name: Nicholas [name] MRN: [id number] DOB: 1941-06-24 Today's Date: 10/11/2013 Time: 7591-6384 PT Time Calculation (min): 37 min  PT Assessment / Plan / Recommendation  History of Present Illness 73 year old male with history of end-stage renal disease on dialysis (M., W., F.) ,CAD with ischemic cardiomyopathy and CABG in 6/14, EF of 50% as per last echo, diabetes mellitus, hypertension, A. fib, basilar neuropathy, depression, osteoporosis, COPD, who was admittedwith abdominal pain, N & V.  Patient here recently  for L1 compression fracture. He was managed nonsurgically  by neurosurgery while in the hospital and was discharged to a skilled nursing facility. He was also diagnosed to have C. difficile and was started on oral vancomycin. Patient now s/p right colectomy and repair of descending and sigmoid serosal tears and ileostomy placement.    PT Comments   Patient able to tolerate edge of bed and attempt at standing with moderate pain in abdominal wound.  Significant drainage with mobilizing and VAC canister filled with new canister needed after return to supine.  Patient will need significant time for recovery; agree with SNF level rehab planned for transfer later today.  Follow Up Recommendations  SNF     Does the patient have the potential to tolerate intense rehabilitation   N/A  Barriers to Discharge  None      Equipment Recommendations  None recommended by PT    Recommendations for Other Services  None  Frequency Min 3X/week   Progress towards PT Goals Progress towards PT goals: Progressing toward goals  Plan Current plan remains appropriate    Precautions / Restrictions Precautions Precautions: Back;Fall;Other (comment) (abdominal wound w/ VAC)   Pertinent Vitals/Pain C/o abdominal pain with mobility; RN aware    Mobility  Bed Mobility Overal bed mobility: Needs Assistance Bed Mobility: Rolling;Sidelying to Sit;Sit to  Supine Rolling: Mod assist Sidelying to sit: Max assist Sit to supine: Max assist General bed mobility comments: assist and cues to use rail for rolling, assist for feet off bed and to lift trunk; patient leaning back due to abdominal wound pain, to supine with attempt through sidelying, but pt rolled back prior to getting feet in the bed Transfers Overall transfer level: Needs assistance Equipment used:  (chair in front to hold for pulling up) Transfers: Sit to/from Stand Sit to Stand: +2 physical assistance;Max assist General transfer comment: use of pad under pt to lift into standing; patient unable to get feet under him without blocking feet, then stood with crouched posture unable to get hips extended.    Exercises General Exercises - Lower Extremity Ankle Circles/Pumps: AROM;Both;20 reps;Supine Long Arc Quad: AROM;Both;10 reps;Seated Heel Slides: AROM;Both;10 reps;Supine Hip Flexion/Marching: AROM;Both;5 reps;Seated    PT Goals (current goals can now be found in the care plan section)    Visit Information  Last PT Received On: 10/11/13 Assistance Needed: +2 History of Present Illness: 73 year old male with history of end-stage renal disease on dialysis (M., W., F.) ,CAD with ischemic cardiomyopathy and CABG in 6/14, EF of 50% as per last echo, diabetes mellitus, hypertension, A. fib, basilar neuropathy, depression, osteoporosis, COPD, who was admittedwith abdominal pain, N & V.  Patient here recently  for L1 compression fracture. He was managed nonsurgically  by neurosurgery while in the hospital and was discharged to a skilled nursing facility. He was also diagnosed to have C. difficile and was started on oral vancomycin. Patient now s/p right colectomy and repair of descending and sigmoid serosal tears and ileostomy placement.  Subjective Data   This really burns   Cognition  Cognition Arousal/Alertness: Awake/alert Behavior During Therapy: Anxious Overall Cognitive  Status: Within Functional Limits for tasks assessed    Balance  Balance Overall balance assessment: History of Falls Sitting-balance support: Feet supported Sitting balance-Leahy Scale: Poor Sitting balance - Comments: leans back and needs support from behind for pain relief Postural control: Posterior lean Standing balance-Leahy Scale: Zero Standing balance comment: unable to support weight in standing  End of Session PT - End of Session Equipment Utilized During Treatment: Gait belt Activity Tolerance: Patient limited by pain Patient left: in bed;with call bell/phone within reach;with bed alarm set   GP     [facility] 10/11/2013, 3:18 PM Nicholas [name], Nicholas [name] 10/11/2013

## 2013-10-11 NOTE — Progress Notes (Signed)
12 Days Post-Op  Subjective: He is tolerating a diet.  His sutures from open wound are loose and he is draining serous fluid from the open wound.  He has some necrotic tissue at the base.  Picture below.  Objective: Vital signs in last 24 hours: Temp:  [97.6 F (36.4 C)-98.5 F (36.9 C)] 97.6 F (36.4 C) (02/24 0522) Pulse Rate:  [72-90] 80 (02/24 0522) Resp:  [18-20] 18 (02/24 0522) BP: (75-111)/(36-75) 96/68 mmHg (02/24 0522) SpO2:  [92 %-98 %] 98 % (02/24 0522) Weight:  [66.5 kg (146 lb 9.7 oz)] 66.5 kg (146 lb 9.7 oz) (02/23 2046) Last BM Date: 10/10/13 Nothing PO recorded. 250 ML stool recorded. 18.3 WBC 350 ml of clear serous fluid in wound vac receptacle.  Open wound without vac 10/11/13  Intake/Output from previous day: 02/23 0701 - 02/24 0700 In: 0  Out: 1148 [Drains:500; Stool:250] Intake/Output this shift:    General appearance: alert, cooperative and no distress GI: soft, non-tender; bowel sounds normal; no masses,  no organomegaly and Wound above, good output from the ostomy.  Lab Results:   Recent Labs  10/10/13 1342 10/11/13 0100  WBC 20.2* 18.3*  HGB 10.0* 8.9*  HCT 32.1* 28.7*  PLT 389 258    BMET  Recent Labs  10/10/13 1342  NA 136*  K 4.4  CL 99  CO2 23  GLUCOSE 114*  BUN 19  CREATININE 3.51*  CALCIUM 7.5*   PT/INR No results found for this basename: LABPROT, INR,  in the last 72 hours   Recent Labs Lab 10/05/13 0540 10/07/13 0658 10/10/13 1342  ALBUMIN 1.4* 1.5* 1.4*     Lipase     Component Value Date/Time   LIPASE 29 10/22/2012 0900     Studies/Results: No results found.  Medications: . acetaminophen  1,000 mg Oral TID  . amiodarone  200 mg Oral Daily  . antiseptic oral rinse  1 application Mouth Rinse QID  . calcitonin (salmon)  1 spray Alternating Nares Daily  . chlorhexidine  15 mL Mouth/Throat BID  . darbepoetin  150 mcg Intravenous Q Mon-HD  . doxercalciferol  2 mcg Intravenous Q M,W,F-HD  . feeding  supplement (NEPRO CARB STEADY)  237 mL Oral Q24H  . feeding supplement (RESOURCE BREEZE)  1 Container Oral BID BM  . ferric gluconate (FERRLECIT/NULECIT) IV  62.5 mg Intravenous Q Fri-HD  . gabapentin  300 mg Oral Daily  . hydrALAZINE  10 mg Oral Once  . lidocaine  1 patch Transdermal Q24H  . midodrine  10 mg Oral BID WC  . multivitamin  1 tablet Oral QHS  . OxyCODONE  20 mg Oral Q12H  . pantoprazole  40 mg Oral Q1200  . psyllium  1 packet Oral BID  . traMADol  50 mg Oral Q12H    Assessment/Plan 1. 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  Grandville Silos 09/29/13  Severe colitis  Mucosal hemorrhage/ulceration  -colonoscopy biopsy report shows adenocarcinoma. 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  Hx of CAD/PVD/hx of AF on Amiodarone preop  AODM/DIABETIC Neuropathy Hypertension COPD/left pleural effusion/Possible bilateral pneumonia ESRD (HD MWF) Anemia   Plan:  Dressing changed and Dr. Georgette Dover has examined the wound.  He wants to continue wound vac.  He  can follow up with Dr. Grandville Silos in 2 weeks in the office.  Dr. Carles Collet would like to allow him to return to SNF today.   LOS: 16 days    Nicholas [name] 10/11/2013

## 2013-10-11 NOTE — Discharge Instructions (Signed)

## 2013-10-12 LAB — GLUCOSE, CAPILLARY: Glucose-Capillary: 80 mg/dL (ref 70–99)

## 2013-10-12 MED ORDER — MIDODRINE HCL 5 MG PO TABS
ORAL_TABLET | ORAL | Status: AC
  Filled 2013-10-12: qty 2

## 2013-10-12 MED ORDER — LIDOCAINE HCL (PF) 1 % IJ SOLN: 5.0000 mL | INTRAMUSCULAR | Status: DC | PRN

## 2013-10-12 MED ORDER — LIDOCAINE-PRILOCAINE 2.5-2.5 % EX CREA: 1.0000 "application " | TOPICAL_CREAM | CUTANEOUS | Status: DC | PRN

## 2013-10-12 MED ORDER — SODIUM CHLORIDE 0.9 % IV SOLN: 100.0000 mL | INTRAVENOUS | Status: DC | PRN

## 2013-10-12 MED ORDER — DOXERCALCIFEROL 4 MCG/2ML IV SOLN
INTRAVENOUS | Status: AC
  Filled 2013-10-12: qty 2

## 2013-10-12 MED ORDER — HEPARIN SODIUM (PORCINE) 1000 UNIT/ML DIALYSIS: 1000.0000 [IU] | INTRAMUSCULAR | Status: DC | PRN

## 2013-10-12 MED ORDER — PENTAFLUOROPROP-TETRAFLUOROETH EX AERO: 1.0000 "application " | INHALATION_SPRAY | CUTANEOUS | Status: DC | PRN

## 2013-10-12 MED ORDER — ALTEPLASE 2 MG IJ SOLR
2.0000 mg | Freq: Once | INTRAMUSCULAR | Status: DC | PRN
  Filled 2013-10-12: qty 2

## 2013-10-12 MED ORDER — NEPRO/CARBSTEADY PO LIQD: 237.0000 mL | ORAL | Status: DC | PRN

## 2013-10-12 MED ORDER — HEPARIN SODIUM (PORCINE) 1000 UNIT/ML DIALYSIS: 20.0000 [IU]/kg | Freq: Once | INTRAMUSCULAR | Status: DC

## 2013-10-12 NOTE — Procedures (Signed)
I was present at this dialysis session, have reviewed the session itself and made  appropriate changes  Kelly Splinter MD (pgr) [phone]    ([phone] 10/12/2013, 10:40 AM

## 2013-10-12 NOTE — Progress Notes (Signed)
[facility] Progress Note   Subjective: no complaints  Filed Vitals:   10/12/13 0900 10/12/13 0929 10/12/13 0937 10/12/13 1000  BP: 90/67 82/50 107/62 91/60  Pulse: 76 76 80 82  Temp:      TempSrc:      Resp:      Height:      Weight:      SpO2:      Exam Alert, pale elderly male, no distress, calm No jvd Chest clear bilat RRR no MRG Abd soft, midline vac in place, RLQ ostomy No leg or UE edema Neuro is nf, ox3, gen weakness R IJ tunneled HD cath in place   Dialysis: [facility] 4h   69kg   2K/2.25 [place]  Heparin tight postop (was on 5000)   R IJ Cath (L AVF failed this admit)  Hectorol 2     Epo 5000     Venofer 50/wk, started this admit Recent lab: Hb 9.9  pth 296   Assessment: 1 Invasive colon Ca 2 Bowel obstruction s/p colectomy/ ileostomy 3 Wound dehiscence / vac in place 4 ESRD on HD, tight heparin 5 Recent L1 vertebral body fx- nonsurg Rx 6 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, 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 8 Anemia- darbe 150/wk 9 HTN/vol- chronic low BP's on midodrine, no vol excess, new dry wt ~66kg 10 Afib/CAD- no AC due to GIB, po amio 11 Dispo- to [facility] after HD today   Plan- HD, d/c after HD today    Kelly Splinter MD  pager [phone]    cell [phone]  10/12/2013, 10:40 AM     Recent Labs Lab 10/07/13 0658 10/10/13 1342  NA 138 136*  K 4.2 4.4  CL 99 99  CO2 28 23  GLUCOSE 70 114*  BUN 14 19  CREATININE 2.44* 3.51*  CALCIUM 7.6* 7.5*  PHOS 3.3 3.1    Recent Labs Lab 10/07/13 0658 10/10/13 1342  ALBUMIN 1.5* 1.4*    Recent Labs Lab 10/08/13 0405 10/10/13 1342 10/11/13 0100  WBC 11.6* 20.2* 18.3*  HGB 9.7* 10.0* 8.9*  HCT 31.6* 32.1* 28.7*  MCV 97.8 98.5 100.7*  PLT 360 389 258   . acetaminophen  1,000 mg Oral TID  . amiodarone  200 mg Oral Daily  . antiseptic oral rinse  1 application Mouth Rinse QID  . calcitonin (salmon)  1 spray Alternating Nares Daily  . chlorhexidine  15 mL Mouth/Throat BID   . darbepoetin  150 mcg Intravenous Q Mon-HD  . doxercalciferol      . doxercalciferol  2 mcg Intravenous Q M,W,F-HD  . feeding supplement (NEPRO CARB STEADY)  237 mL Oral Q24H  . feeding supplement (RESOURCE BREEZE)  1 Container Oral BID BM  . ferric gluconate (FERRLECIT/NULECIT) IV  62.5 mg Intravenous Q Fri-HD  . gabapentin  300 mg Oral Daily  . [START ON 10/13/2013] heparin  20 Units/kg Dialysis Once in dialysis  . hydrALAZINE  10 mg Oral Once  . lidocaine  1 patch Transdermal Q24H  . midodrine      . midodrine  10 mg Oral BID WC  . multivitamin  1 tablet Oral QHS  . OxyCODONE  20 mg Oral Q12H  . pantoprazole  40 mg Oral Q1200  . psyllium  1 packet Oral BID  . traMADol  50 mg Oral Q12H   . sodium chloride Stopped (10/03/13 1300)   sodium chloride, sodium chloride, alteplase, bismuth subsalicylate, feeding supplement (NEPRO CARB STEADY),  heparin, HYDROmorphone (DILAUDID) injection, lidocaine (PF), lidocaine-prilocaine, loperamide, methocarbamol, oxyCODONE, pentafluoroprop-tetrafluoroeth, promethazine, simethicone

## 2013-10-12 NOTE — Clinical Social Work Note (Signed)
Patient discharged back to [place] today. CSW facilitated transport via ambulance. Family aware of discharge.  Kaja Jackowski Givens, MSW, LCSW [phone]

## 2013-10-12 NOTE — Progress Notes (Signed)
Patient was seen examined and dialysis. Please see full discharge summary dictated by Dr. Shanon Brow Tat on 10/11/2013.   Patient has no complaints this morning. Patient was not discharged to pending wound VAC.  He will be discharged today to nursing home facility, [facility].  Time spent: 15 minutes  Nicholas [name] D.O. [facility] Pager [phone]  If 7PM-7AM, please contact night-coverage [email or web address] Password [facility] 10/12/2013, 11:46 AM

## 2013-10-12 NOTE — Progress Notes (Signed)
13 Days Post-Op  Subjective: Patient seen in dialysis - no complaints Tolerating PO's  About 300 cc of serous drainage from VAC  Objective: Vital signs in last 24 hours: Temp:  [97.7 F (36.5 C)-98.6 F (37 C)] 97.7 F (36.5 C) (02/25 0649) Pulse Rate:  [71-89] 76 (02/25 0929) Resp:  [18] 18 (02/25 0649) BP: (77-107)/(50-67) 107/62 mmHg (02/25 0937) SpO2:  [97 %-99 %] 97 % (02/25 0649) Weight:  [146 lb 9.7 oz (66.5 kg)] 146 lb 9.7 oz (66.5 kg) (02/25 0649) Last BM Date: 10/11/13  Intake/Output from previous day: 02/24 0701 - 02/25 0700 In: 720 [P.O.:720] Out: 400 [Drains:300; Stool:100] Intake/Output this shift:    General appearance: alert, cooperative and no distress GI: soft, ostomy - pink and functioning well VAC with good seal  Lab Results:   Recent Labs  10/10/13 1342 10/11/13 0100  WBC 20.2* 18.3*  HGB 10.0* 8.9*  HCT 32.1* 28.7*  PLT 389 258   BMET  Recent Labs  10/10/13 1342  NA 136*  K 4.4  CL 99  CO2 23  GLUCOSE 114*  BUN 19  CREATININE 3.51*  CALCIUM 7.5*   PT/INR No results found for this basename: LABPROT, INR,  in the last 72 hours ABG No results found for this basename: PHART, PCO2, PO2, HCO3,  in the last 72 hours  Studies/Results: No results found.  Anti-infectives: Anti-infectives   Start     Dose/Rate Route Frequency Ordered Stop   09/30/13 1400  ertapenem (INVANZ) 0.5 g in sodium chloride 0.9 % 50 mL IVPB  Status:  Discontinued     500 mg 100 mL/hr over 30 Minutes Intravenous Every 24 hours 09/29/13 2202 10/07/13 1106   09/29/13 1415  ertapenem (INVANZ) 1 g in sodium chloride 0.9 % 50 mL IVPB     1 g 100 mL/hr over 30 Minutes Intravenous  Once 09/29/13 1159 09/29/13 1412   09/29/13 0600  cefUROXime (ZINACEF) 1.5 g in dextrose 5 % 50 mL IVPB  Status:  Discontinued     1.5 g 100 mL/hr over 30 Minutes Intravenous On call to O.R. 09/28/13 1711 09/29/13 2203   09/28/13 1200  ceFAZolin (ANCEF) IVPB 2 g/50 mL premix     Comments:  Tube to 17   2 g 100 mL/hr over 30 Minutes Intravenous  Once 09/28/13 1155 09/28/13 1246   09/27/13 1200  vancomycin (VANCOCIN) IVPB 1000 mg/200 mL premix  Status:  Discontinued     1,000 mg 200 mL/hr over 60 Minutes Intravenous Every M-W-F (Hemodialysis) 09/26/13 1638 09/26/13 1721   09/26/13 1745  metroNIDAZOLE (FLAGYL) IVPB 500 mg  Status:  Discontinued     500 mg 100 mL/hr over 60 Minutes Intravenous Every 8 hours 09/26/13 1722 10/03/13 1112   09/26/13 1200  vancomycin (VANCOCIN) IVPB 750 mg/150 ml premix  Status:  Discontinued     750 mg 150 mL/hr over 60 Minutes Intravenous Every M-W-F (Hemodialysis) 09/25/13 2344 09/26/13 1051   09/26/13 1200  vancomycin (VANCOCIN) IVPB 1000 mg/200 mL premix  Status:  Discontinued     1,000 mg 200 mL/hr over 60 Minutes Intravenous Every M-W-F (Hemodialysis) 09/26/13 1052 09/26/13 1638   09/26/13 0600  piperacillin-tazobactam (ZOSYN) IVPB 2.25 g  Status:  Discontinued     2.25 g 100 mL/hr over 30 Minutes Intravenous 3 times per day 09/25/13 2126 09/26/13 1721   09/26/13 0000  vancomycin (VANCOCIN) 500 mg in sodium chloride irrigation 0.9 % 100 mL ENEMA  Status:  Discontinued  500 mg Rectal 4 times per day 09/25/13 2337 09/26/13 1722   09/25/13 2130  vancomycin (VANCOCIN) IVPB 750 mg/150 ml premix     750 mg 150 mL/hr over 60 Minutes Intravenous  Once 09/25/13 2124 09/26/13 0145   09/25/13 2000  piperacillin-tazobactam (ZOSYN) IVPB 3.375 g     3.375 g 100 mL/hr over 30 Minutes Intravenous  Once 09/25/13 1853 09/25/13 2002   09/25/13 2000  vancomycin (VANCOCIN) IVPB 1000 mg/200 mL premix     1,000 mg 200 mL/hr over 60 Minutes Intravenous  Once 09/25/13 1859 09/25/13 2113      Assessment/Plan: s/p Procedure(s): PARTIAL COLECTOMY (Right) COLOSTOMY (N/A) Discharge to SNF per primary team. Follow-up in chart already.   LOS: 17 days    Rosali Augello K. 10/12/2013

## 2013-10-12 NOTE — Progress Notes (Signed)
Pt prepared for d/c to [facility] (SNF). IV d/c'd. Skin intact except as most recently charted. Vitals are stable. Report called to [facility] @ receiving facility. Pt to be transported by ambulance service.  Jillyn Ledger, MBA, BS, RN

## 2013-10-19 ENCOUNTER — Other Ambulatory Visit: Payer: Self-pay | Admitting: Oncology

## 2013-10-19 ENCOUNTER — Telehealth: Payer: Self-pay | Admitting: Medical Oncology

## 2013-10-19 NOTE — Telephone Encounter (Signed)
Call to patient to confirm tomorrow's appt. LVMOM asking patient to bring a list of current medications, to inform patient of our free valet parking and asked patient to return call to confirm and should he have any questions or concerns regarding appt.

## 2013-10-20 ENCOUNTER — Telehealth: Payer: Self-pay | Admitting: Oncology

## 2013-10-20 ENCOUNTER — Ambulatory Visit (HOSPITAL_BASED_OUTPATIENT_CLINIC_OR_DEPARTMENT_OTHER): Payer: Medicare Other | Admitting: Oncology

## 2013-10-20 ENCOUNTER — Other Ambulatory Visit: Payer: Medicare Other

## 2013-10-20 ENCOUNTER — Encounter: Payer: Self-pay | Admitting: Oncology

## 2013-10-20 ENCOUNTER — Encounter: Payer: Self-pay | Admitting: Medical Oncology

## 2013-10-20 ENCOUNTER — Ambulatory Visit: Payer: Medicare Other

## 2013-10-20 VITALS — BP 72/42 | HR 101 | Temp 98.1°F | Resp 18

## 2013-10-20 DIAGNOSIS — D649 Anemia, unspecified: Secondary | ICD-10-CM

## 2013-10-20 DIAGNOSIS — N186 End stage renal disease: Secondary | ICD-10-CM

## 2013-10-20 DIAGNOSIS — C180 Malignant neoplasm of cecum: Secondary | ICD-10-CM

## 2013-10-20 DIAGNOSIS — C182 Malignant neoplasm of ascending colon: Secondary | ICD-10-CM

## 2013-10-20 NOTE — Progress Notes (Signed)
Checked in new patient wth no financial issues.

## 2013-10-20 NOTE — Progress Notes (Signed)
Please see consult note.  

## 2013-10-20 NOTE — Telephone Encounter (Signed)
gv pt relative appt schedule for june including ct for june that was already on schedule. relative also has ct prep/instructions.

## 2013-10-20 NOTE — Consult Note (Signed)
Reason for Referral: Colon Cancer.   HPI: Mr. Heuring is a 73 years old of [place] where he lived the majority of his life. He was living independently up till December of 2014 where he had frequent hospitalizations and currently resides in a nursing home. He has multiple comorbid conditions including coronary artery disease as well as end-stage renal disease with hemodialysis dependence. He was admitted through the emergency room on 09/25/2013 with emesis. Several weeks ago, he was in this hospital for a prolonged stay due to L1 compression fracture after a fall. He was C. difficile positive during that admission on January 24, but after going to the skilled nursing facility. His admission CT showed a small bowel obstruction pattern with a lot of feculent material in the distal small bowel with a relatively decompressed colon, raising the question of a possible obstructive process in the region of the proximal colon or cecum. He underwent a colonoscopy initially which showed a large mass near the ileocecal valve on a colonoscopy. He did have a perforation subsequent to that and underwent a laparotomy with a right colectomy as well as an ileostomy. That was done on 09/29/2013. The pathology from the operation showed invasive adenocarcinoma that is moderately differentiated with a tumor spanning 7 cm extending into the pericolonic soft tissue. 5/19 lymph nodes were involved with cancer and his final pathological staging was T2 N2a. He recovered from his operation slowly and subsequently was discharged to a skilled nursing facility with a wound VAC and was referred to me for an evaluation regarding his recent cancer diagnosis. Clinically, patient still rather debilitated. He is wheelchair-bound for the most part. He has very limited mobility. He still have a wound VAC in place as mentioned but is not reporting any complications from it. He does report some occasional abdominal discomfort but no hematochezia or  melena. Is not reporting any vomiting or changes in the consistency of his stool. He is not reporting any pain or discomfort is able to sleep well. His performance status remains very poor.   Past Medical History  Diagnosis Date  . Coronary artery disease     a. hx of MI 1992; b. 10/2012 NSTEMI (no cath 2/2 GIB);  c. 11/2012 MV: Inf defect w inf and septal HK (favor prior MI), EF 47%;  d. 12/2012 Cath: Severe 3vd, EF 45-50%;  e. 01/2013 CABG x 5 (LIMA->LAD, VG->D1, VG->OM1->OM2, VG->PDA)  . Diabetes mellitus   . Hypertension   . Arthritis   . Carotid artery disease     a. 01/2013 doppler: >80% RICA stenosis, <71% LICA stenosis;  b. 0/6269 R CEA.  . Gastritis 2012 and 09/2011    treated for H Pylori in 12/2011  . Diabetic neuropathy   . Osteoporosis   . COPD (chronic obstructive pulmonary disease)   . Anxiety   . Depression   . ESRD (end stage renal disease)     M-W-Sa  [name]  . Diabetic nephropathy   . GERD (gastroesophageal reflux disease)   . Neuromuscular disorder     peripheral neurotherapy  . Anemia   . A-fib     a. Dx 10/2012, on amio, no anticoagulation 2/2 GIB 10/2012.  [name] GIB (gastrointestinal bleeding)     a. 10/2012, awaiting colonoscopy.  :  Past Surgical History  Procedure Laterality Date  . Coronary stent placement    . Hip surgery      both hips  . Av fistula placement      Left arm  .  Cardiac catheterization    . Joint replacement Bilateral   . Eye surgery Bilateral     cararacts  . Coronary artery bypass graft N/A 02/03/2013    Procedure: CORONARY ARTERY BYPASS GRAFTING (CABG);  Surgeon: Melrose Nakayama, MD;  Location: [place];  Service: Open Heart Surgery;  Laterality: N/A;  . Endarterectomy N/A 02/03/2013    Procedure: ENDARTERECTOMY CAROTID;  Surgeon: Mal Misty, MD;  Location: [place];  Service: Vascular;  Laterality: N/A;  . Olecranon bursectomy Right 07/28/2013    Procedure: RIGHT ELBOW OLECRANON BURSECTOMY ADVANCEMENT CLOSURE ;  Surgeon: Linna Hoff, MD;  Location: [place];  Service: Orthopedics;  Laterality: Right;  . Colonoscopy N/A 09/29/2013    Procedure: COLONOSCOPY;  Surgeon: Cleotis Nipper, MD;  Location: [facility] END[facility]COPY;  Service: Endoscopy;  Laterality: N/A;  CO2  ultra-slim scope if available  . Partial colectomy Right 09/29/2013    Procedure: PARTIAL COLECTOMY;  Surgeon: Zenovia Jarred, MD;  Location: [place];  Service: General;  Laterality: Right;  . Colostomy N/A 09/29/2013    Procedure: COLOSTOMY;  Surgeon: Zenovia Jarred, MD;  Location: [place];  Service: General;  Laterality: N/A;  :  Current Outpatient Prescriptions  Medication Sig Dispense Refill  . acetaminophen (TYLENOL) 500 MG tablet Take 500 mg by mouth every 4 (four) hours as needed (pain).       [name] allopurinol (ZYLOPRIM) 100 MG tablet Take 1 tablet (100 mg total) by mouth daily.  30 tablet  0  . amiodarone (PACERONE) 200 MG tablet Take 200 mg by mouth daily.      [name] aspirin EC 81 MG tablet Take 1 tablet (81 mg total) by mouth daily.      . bisacodyl (DULCOLAX) 10 MG suppository Place 1 suppository (10 mg total) rectally daily as needed for moderate constipation (gas pain).  12 suppository  0  . calcitonin, salmon, (MIACALCIN/FORTICAL) 200 UNIT/ACT nasal spray Place 1 spray into alternate nostrils daily as needed.      . darbepoetin (ARANESP) 60 MCG/0.3ML SOLN injection Inject 0.3 mLs (60 mcg total) into the vein every Monday with hemodialysis.  4.2 mL  3  . doxercalciferol (HECTOROL) 4 MCG/2ML injection Inject 1 mL (2 mcg total) into the vein every Monday, Wednesday, and Friday with hemodialysis.  2 mL  3  . feeding supplement, RESOURCE BREEZE, (RESOURCE BREEZE) LIQD Take 1 Container by mouth daily.  30 Container  3  . fish oil-omega-3 fatty acids 1000 MG capsule Take 1 g by mouth 2 (two) times daily.       [name] gabapentin (NEURONTIN) 300 MG capsule Take 1 capsule (300 mg total) by mouth 2 (two) times daily.      [name] HYDROcodone-acetaminophen (NORCO/VICODIN) 5-325 MG  per tablet Take 1 tablet by mouth 2 (two) times daily.      . insulin aspart (NOVOLOG) 100 UNIT/ML injection Inject 0-9 Units into the skin 3 (three) times daily with meals.  10 mL  11  . ipratropium (ATROVENT) 0.06 % nasal spray Place 2 sprays into both nostrils 3 (three) times daily.       [name] lidocaine (LIDODERM) 5 % Place 1 patch onto the skin daily. Remove & Discard patch within 12 hours or as directed by MD      . magnesium hydroxide (MILK OF MAGNESIA) 400 MG/5ML suspension Take by mouth every 12 (twelve) hours as needed for mild constipation.      . methocarbamol (ROBAXIN) 750 MG tablet Take 1 tablet (  750 mg total) by mouth every 6 (six) hours as needed for muscle spasms.  50 tablet  0  . metoCLOPramide (REGLAN) 5 MG tablet Take 5 mg by mouth 3 (three) times daily.      . midodrine (PROAMATINE) 5 MG tablet Take 5 mg by mouth 3 (three) times a week. Take once daily in the morning on dialysis days (usually on Monday, Wednesday and Friday)      . multivitamin (RENA-VIT) TABS tablet Take 1 tablet by mouth at bedtime.  30 tablet  3  . Nutritional Supplements (FEEDING SUPPLEMENT, NEPRO CARB STEADY,) LIQD Take 237 mLs by mouth 2 (two) times daily.  30 Can  3  . omeprazole (PRILOSEC) 20 MG capsule Take 20 mg by mouth 2 (two) times daily.      [name] oxyCODONE (OXY IR/ROXICODONE) 5 MG immediate release tablet Take 1-2 tablets (5-10 mg total) by mouth every 4 (four) hours as needed for moderate pain or severe pain.  60 tablet  0  . OxyCODONE (OXYCONTIN) 20 mg T12A 12 hr tablet Take 1 tablet (20 mg total) by mouth every 12 (twelve) hours.  60 tablet  0  . pantoprazole (PROTONIX) 40 MG tablet Take 40 mg by mouth daily.      . polyethylene glycol (MIRALAX / GLYCOLAX) packet Take 17 g by mouth daily.      . promethazine (PHENERGAN) 25 MG tablet Take 25 mg by mouth every 12 (twelve) hours as needed for nausea or vomiting.      . saccharomyces boulardii (FLORASTOR) 250 MG capsule Take 1 capsule (250 mg total) by  mouth 2 (two) times daily.  30 capsule  0  . senna (SENOKOT) 8.6 MG TABS tablet Take 1 tablet by mouth 2 (two) times daily.      . sevelamer carbonate (RENVELA) 800 MG tablet Take 800 mg by mouth 3 (three) times daily with meals.      . simethicone (MYLICON) 80 MG chewable tablet Chew 2 tablets (160 mg total) by mouth 4 (four) times daily as needed for flatulence.  30 tablet  0  . sodium chloride 0.9 % SOLN 100 mL with ferric gluconate 12.5 MG/ML SOLN 62.5 mg Inject 62.5 mg into the vein every Monday, Wednesday, and Friday with hemodialysis.  1 vial  0  . sorbitol 70 % solution Take 15 mLs by mouth daily as needed.      . traMADol (ULTRAM) 50 MG tablet Take 1 tablet (50 mg total) by mouth every 12 (twelve) hours.  60 tablet  0  . Witch Hazel (PREPARATION H EX) Apply 1 application topically every 6 (six) hours as needed (hemorrhoids).             Allergies  Allergen Reactions  . Statins Other (See Comments)    Muscle weakness   :  Family History  Problem Relation Age of Onset  . Heart attack Father 4  . Heart disease Father   . Stroke Mother   . Hypertension Mother   . Other Mother     varicose veins  . Stroke Maternal Grandmother   . Hypertension Brother   :  History   Social History  . Marital Status: Widowed    Spouse Name: N/A    Number of Children: N/A  . Years of Education: N/A   Occupational History  . other     retired Administrator.   Social History Main Topics  . Smoking status: Former Smoker -- 1.00 packs/day for 25 years  Types: Cigarettes    Start date: 08/18/1965    Quit date: 08/18/1990  . Smokeless tobacco: Never Used  . Alcohol Use: Yes     Comment: occasionally  . Drug Use: No  . Sexual Activity: Not on file   Other Topics Concern  . Not on file   Social History Narrative   Was living in [place] by himself prior to CABG and hopes to return there after rehab @ [facility].  :  Constitutional: positive for fatigue and weight loss,  negative for chills, fevers and night sweats Eyes: negative for icterus, irritation and redness Ears, nose, mouth, throat, and face: negative for ear drainage, sore throat and tinnitus Respiratory: positive for dyspnea on exertion, negative for sputum, stridor and wheezing Cardiovascular: negative for chest pain, chest pressure/discomfort, lower extremity edema, orthopnea and palpitations Gastrointestinal: positive for abdominal pain, negative for diarrhea, dysphagia, jaundice, melena and vomiting Genitourinary:negative for dysuria, frequency and hematuria Integument/breast: negative for pruritus, rash and skin color change Hematologic/lymphatic: negative for bleeding, easy bruising and lymphadenopathy Musculoskeletal:negative for arthralgias and stiff joints Neurological: negative for headaches and seizures Behavioral/Psych: negative for anxiety and depression Endocrine: negative for temperature intolerance Allergic/Immunologic: negative for angioedema and urticaria  Exam: ECOG 2 Blood pressure 72/42, pulse 101, temperature 98.1 F (36.7 C), temperature source Oral, resp. rate 18, weight 0 lb (0 kg), SpO2 98.00%. General appearance: alert, cooperative and chroniclly ill appearing.  Head: Normocephalic, without obvious abnormality, atraumatic Throat: lips, mucosa, and tongue normal; teeth and gums normal Neck: no adenopathy, no carotid bruit, no JVD, supple, symmetrical, trachea midline and thyroid not enlarged, symmetric, no tenderness/mass/nodules Back: symmetric, no curvature. ROM normal. No CVA tenderness. Resp: clear to auscultation bilaterally Chest wall: no tenderness Cardio: regular rate and rhythm, S1, S2 normal, no murmur, click, rub or gallop GI: soft, non-tender; bowel sounds normal; no masses,  no organomegaly Extremities: extremities normal, atraumatic, no cyanosis or edema Pulses: 2+ and symmetric Skin: Skin color, texture, turgor normal. No rashes or lesions Lymph nodes:  Cervical, supraclavicular, and axillary nodes normal. Neurologic: Grossly normal Incision/Wound: wound VAC was noted without any drainage or erythema.      Ct Abdomen Pelvis Wo Contrast  10/06/2013   CLINICAL DATA:  Status post colectomy for colon cancer.  EXAM: CT ABDOMEN AND PELVIS WITHOUT CONTRAST  TECHNIQUE: Multidetector CT imaging of the abdomen and pelvis was performed following the standard protocol without intravenous contrast.  COMPARISON:  CT scan of September 25, 2013.  FINDINGS: L1 compression fracture is again noted. Moderate left pleural effusion is noted with left lower lobe atelectasis. Status post bilateral hip arthroplasties. Mild right basilar opacity is noted concerning for pneumonia or atelectasis with associated pleural effusion.  No focal abnormality is seen in the liver, spleen or pancreas on these unenhanced images. Dense sludge is noted in the gallbladder. Fluid is noted around the liver and spleen, as well as in the pelvis consistent with ascites. Colostomy is noted in the right lower quadrant and patient is status post right colectomy. Mild pneumoperitoneum is noted in the epigastric region. Midline surgical incision is noted. Small bowel dilatation is noted which may represent postoperative ileus. Bilateral renal atrophy is noted. Stable rounded and exophytic hyperdense and hypodense abnormalities are seen involving both kidneys most likely representing cysts. Renal ultrasound may be performed for further evaluation. Evaluation of the pelvis is limited due to scatter artifact from previously described bilateral hip arthroplasties. Atherosclerotic calcifications of abdominal aorta are noted without aneurysm formation. Adrenal glands  appear normal.  IMPRESSION: L1 compression fracture is again noted.  Moderate left pleural effusion is noted with left lower lobe atelectasis. Mild right basilar opacity is noted concerning for pneumonia or atelectasis.  Mild ascites is noted.  Status  post right hemicolectomy with colostomy seen in right lower quadrant. Mildly dilated small bowel loops are noted most consistent with postoperative ileus, but distal obstruction cannot be excluded.  Stable rounded and exophytic hyperdense and hypodense abnormalities are seen involving both kidneys which may represent cysts; renal ultrasound is recommended for further evaluation.  Bilateral renal atrophy is noted.  Mild pneumoperitoneum is noted in the epigastric region most likely postoperative in nature.   Electronically Signed   By: Sabino Dick M.D.   On: 10/06/2013 21:16    Assessment and Plan:   73 year old gentleman with the following issues:  1. Stage III colon cancer diagnosed in February of 2015. He presented with a right colonic mass in the distal obstruction as well as perforation. He underwent surgical resection and the pathology revealed a T2 N2a disease with 5/19 lymph nodes involved. His recovering slowly at this time from his operation was on 09/29/2013. The natural course of this disease was discussed today in detail with Mr. Borge, his brother and his sister-in-law. I explained to him that although his tumor has been fully removed he has high risk of tumor recurrence that exceeds 50%. He has a lot of high risk features including perforation as well as increased number of lymph nodes and involvement. I explained to him though of adjuvant chemotherapy in this particular setting unfortunately he is not a candidate for it. Given his poor performance status, end-stage renal disease, as well as other comorbid conditions makes him a poor chemotherapy candidate. However I feel that we have to continue with active surveillance and repeat imaging studies in about 3-4 months to followup the status of his cancer and may be at that time his performance status slightly improved and we can reconsider his chemotherapy eligibility. All his questions were answered today to his satisfaction.  2. Anemia:  This is probably related to his colon cancer as well as his renal disease. I probably will use growth factor support with hemodialysis.  3. Abdominal wound: He will followup with Dr. Grandville Silos and colleagues regarding his wound care.

## 2013-10-20 NOTE — Progress Notes (Signed)
Per MD, no labs needed for today's appt. Lab appt cancelled.

## 2013-11-02 ENCOUNTER — Ambulatory Visit (INDEPENDENT_AMBULATORY_CARE_PROVIDER_SITE_OTHER): Payer: Medicare Other | Admitting: General Surgery

## 2013-11-02 ENCOUNTER — Encounter (HOSPITAL_COMMUNITY): Payer: Medicare Other | Admitting: Certified Registered"

## 2013-11-02 ENCOUNTER — Inpatient Hospital Stay (HOSPITAL_COMMUNITY)
Admission: EM | Admit: 2013-11-02 | Discharge: 2013-11-16 | DRG: 907 | Disposition: E | Payer: Medicare Other | Attending: Surgery | Admitting: Surgery

## 2013-11-02 ENCOUNTER — Telehealth (INDEPENDENT_AMBULATORY_CARE_PROVIDER_SITE_OTHER): Payer: Self-pay | Admitting: *Deleted

## 2013-11-02 ENCOUNTER — Inpatient Hospital Stay (HOSPITAL_COMMUNITY): Payer: Medicare Other | Admitting: Certified Registered"

## 2013-11-02 ENCOUNTER — Inpatient Hospital Stay (HOSPITAL_COMMUNITY): Payer: Medicare Other

## 2013-11-02 ENCOUNTER — Encounter (HOSPITAL_COMMUNITY): Admission: EM | Disposition: E | Payer: Self-pay | Source: Home / Self Care

## 2013-11-02 ENCOUNTER — Telehealth (INDEPENDENT_AMBULATORY_CARE_PROVIDER_SITE_OTHER): Payer: Self-pay

## 2013-11-02 ENCOUNTER — Encounter (INDEPENDENT_AMBULATORY_CARE_PROVIDER_SITE_OTHER): Payer: Self-pay | Admitting: General Surgery

## 2013-11-02 ENCOUNTER — Encounter (HOSPITAL_COMMUNITY): Payer: Self-pay | Admitting: Emergency Medicine

## 2013-11-02 VITALS — BP 98/60 | HR 60 | Temp 98.0°F | Resp 14 | Ht 66.0 in | Wt 163.0 lb

## 2013-11-02 DIAGNOSIS — M129 Arthropathy, unspecified: Secondary | ICD-10-CM | POA: Diagnosis present

## 2013-11-02 DIAGNOSIS — R579 Shock, unspecified: Secondary | ICD-10-CM | POA: Diagnosis not present

## 2013-11-02 DIAGNOSIS — Z794 Long term (current) use of insulin: Secondary | ICD-10-CM

## 2013-11-02 DIAGNOSIS — J189 Pneumonia, unspecified organism: Secondary | ICD-10-CM | POA: Diagnosis not present

## 2013-11-02 DIAGNOSIS — Y9289 Other specified places as the place of occurrence of the external cause: Secondary | ICD-10-CM

## 2013-11-02 DIAGNOSIS — E1142 Type 2 diabetes mellitus with diabetic polyneuropathy: Secondary | ICD-10-CM | POA: Diagnosis present

## 2013-11-02 DIAGNOSIS — K660 Peritoneal adhesions (postprocedural) (postinfection): Secondary | ICD-10-CM | POA: Diagnosis present

## 2013-11-02 DIAGNOSIS — A419 Sepsis, unspecified organism: Secondary | ICD-10-CM

## 2013-11-02 DIAGNOSIS — R404 Transient alteration of awareness: Secondary | ICD-10-CM | POA: Diagnosis not present

## 2013-11-02 DIAGNOSIS — I428 Other cardiomyopathies: Secondary | ICD-10-CM | POA: Diagnosis present

## 2013-11-02 DIAGNOSIS — Z7982 Long term (current) use of aspirin: Secondary | ICD-10-CM

## 2013-11-02 DIAGNOSIS — Z85038 Personal history of other malignant neoplasm of large intestine: Secondary | ICD-10-CM

## 2013-11-02 DIAGNOSIS — D72829 Elevated white blood cell count, unspecified: Secondary | ICD-10-CM | POA: Diagnosis not present

## 2013-11-02 DIAGNOSIS — Z9049 Acquired absence of other specified parts of digestive tract: Secondary | ICD-10-CM

## 2013-11-02 DIAGNOSIS — Z515 Encounter for palliative care: Secondary | ICD-10-CM

## 2013-11-02 DIAGNOSIS — I120 Hypertensive chronic kidney disease with stage 5 chronic kidney disease or end stage renal disease: Secondary | ICD-10-CM | POA: Diagnosis present

## 2013-11-02 DIAGNOSIS — T81321A Disruption or dehiscence of closure of internal operation (surgical) wound of abdominal wall muscle or fascia, initial encounter: Secondary | ICD-10-CM | POA: Diagnosis present

## 2013-11-02 DIAGNOSIS — IMO0002 Reserved for concepts with insufficient information to code with codable children: Secondary | ICD-10-CM

## 2013-11-02 DIAGNOSIS — F329 Major depressive disorder, single episode, unspecified: Secondary | ICD-10-CM | POA: Diagnosis present

## 2013-11-02 DIAGNOSIS — Z823 Family history of stroke: Secondary | ICD-10-CM

## 2013-11-02 DIAGNOSIS — R339 Retention of urine, unspecified: Secondary | ICD-10-CM | POA: Diagnosis not present

## 2013-11-02 DIAGNOSIS — E43 Unspecified severe protein-calorie malnutrition: Secondary | ICD-10-CM

## 2013-11-02 DIAGNOSIS — I959 Hypotension, unspecified: Secondary | ICD-10-CM

## 2013-11-02 DIAGNOSIS — E1149 Type 2 diabetes mellitus with other diabetic neurological complication: Secondary | ICD-10-CM | POA: Diagnosis present

## 2013-11-02 DIAGNOSIS — M949 Disorder of cartilage, unspecified: Secondary | ICD-10-CM

## 2013-11-02 DIAGNOSIS — M810 Age-related osteoporosis without current pathological fracture: Secondary | ICD-10-CM | POA: Diagnosis present

## 2013-11-02 DIAGNOSIS — I498 Other specified cardiac arrhythmias: Secondary | ICD-10-CM | POA: Diagnosis not present

## 2013-11-02 DIAGNOSIS — R609 Edema, unspecified: Secondary | ICD-10-CM | POA: Diagnosis not present

## 2013-11-02 DIAGNOSIS — T8131XA Disruption of external operation (surgical) wound, not elsewhere classified, initial encounter: Secondary | ICD-10-CM

## 2013-11-02 DIAGNOSIS — N2581 Secondary hyperparathyroidism of renal origin: Secondary | ICD-10-CM | POA: Diagnosis present

## 2013-11-02 DIAGNOSIS — J449 Chronic obstructive pulmonary disease, unspecified: Secondary | ICD-10-CM | POA: Diagnosis present

## 2013-11-02 DIAGNOSIS — I4891 Unspecified atrial fibrillation: Secondary | ICD-10-CM | POA: Diagnosis present

## 2013-11-02 DIAGNOSIS — E2749 Other adrenocortical insufficiency: Secondary | ICD-10-CM | POA: Diagnosis present

## 2013-11-02 DIAGNOSIS — Z87891 Personal history of nicotine dependence: Secondary | ICD-10-CM

## 2013-11-02 DIAGNOSIS — Z933 Colostomy status: Secondary | ICD-10-CM

## 2013-11-02 DIAGNOSIS — I509 Heart failure, unspecified: Secondary | ICD-10-CM | POA: Diagnosis present

## 2013-11-02 DIAGNOSIS — E1129 Type 2 diabetes mellitus with other diabetic kidney complication: Secondary | ICD-10-CM

## 2013-11-02 DIAGNOSIS — J9600 Acute respiratory failure, unspecified whether with hypoxia or hypercapnia: Secondary | ICD-10-CM

## 2013-11-02 DIAGNOSIS — F411 Generalized anxiety disorder: Secondary | ICD-10-CM | POA: Diagnosis present

## 2013-11-02 DIAGNOSIS — D62 Acute posthemorrhagic anemia: Secondary | ICD-10-CM | POA: Diagnosis not present

## 2013-11-02 DIAGNOSIS — K659 Peritonitis, unspecified: Secondary | ICD-10-CM

## 2013-11-02 DIAGNOSIS — I2510 Atherosclerotic heart disease of native coronary artery without angina pectoris: Secondary | ICD-10-CM | POA: Diagnosis present

## 2013-11-02 DIAGNOSIS — Z992 Dependence on renal dialysis: Secondary | ICD-10-CM

## 2013-11-02 DIAGNOSIS — C180 Malignant neoplasm of cecum: Secondary | ICD-10-CM

## 2013-11-02 DIAGNOSIS — Z9861 Coronary angioplasty status: Secondary | ICD-10-CM

## 2013-11-02 DIAGNOSIS — Z951 Presence of aortocoronary bypass graft: Secondary | ICD-10-CM

## 2013-11-02 DIAGNOSIS — J90 Pleural effusion, not elsewhere classified: Secondary | ICD-10-CM | POA: Diagnosis not present

## 2013-11-02 DIAGNOSIS — N186 End stage renal disease: Secondary | ICD-10-CM | POA: Diagnosis present

## 2013-11-02 DIAGNOSIS — K219 Gastro-esophageal reflux disease without esophagitis: Secondary | ICD-10-CM | POA: Diagnosis present

## 2013-11-02 DIAGNOSIS — J4489 Other specified chronic obstructive pulmonary disease: Secondary | ICD-10-CM | POA: Diagnosis present

## 2013-11-02 DIAGNOSIS — Y838 Other surgical procedures as the cause of abnormal reaction of the patient, or of later complication, without mention of misadventure at the time of the procedure: Secondary | ICD-10-CM | POA: Diagnosis present

## 2013-11-02 DIAGNOSIS — I252 Old myocardial infarction: Secondary | ICD-10-CM

## 2013-11-02 DIAGNOSIS — F3289 Other specified depressive episodes: Secondary | ICD-10-CM | POA: Diagnosis present

## 2013-11-02 DIAGNOSIS — Z66 Do not resuscitate: Secondary | ICD-10-CM

## 2013-11-02 DIAGNOSIS — Z8249 Family history of ischemic heart disease and other diseases of the circulatory system: Secondary | ICD-10-CM

## 2013-11-02 DIAGNOSIS — M899 Disorder of bone, unspecified: Secondary | ICD-10-CM | POA: Diagnosis present

## 2013-11-02 HISTORY — PX: COLOSTOMY REVISION: SHX5232

## 2013-11-02 HISTORY — PX: APPLICATION OF WOUND VAC: SHX5189

## 2013-11-02 HISTORY — PX: LAPAROTOMY: SHX154

## 2013-11-02 LAB — CBC WITH DIFFERENTIAL/PLATELET
Basophils Absolute: 0 10*3/uL (ref 0.0–0.1)
Basophils Relative: 0 % (ref 0–1)
EOS ABS: 0.1 10*3/uL (ref 0.0–0.7)
Eosinophils Relative: 1 % (ref 0–5)
HCT: 36.6 % — ABNORMAL LOW (ref 39.0–52.0)
HEMOGLOBIN: 11.2 g/dL — AB (ref 13.0–17.0)
LYMPHS ABS: 1.4 10*3/uL (ref 0.7–4.0)
Lymphocytes Relative: 12 % (ref 12–46)
MCH: 33.4 pg (ref 26.0–34.0)
MCHC: 30.6 g/dL (ref 30.0–36.0)
MCV: 109.3 fL — ABNORMAL HIGH (ref 78.0–100.0)
MONOS PCT: 5 % (ref 3–12)
Monocytes Absolute: 0.6 10*3/uL (ref 0.1–1.0)
NEUTROS ABS: 9.8 10*3/uL — AB (ref 1.7–7.7)
Neutrophils Relative %: 83 % — ABNORMAL HIGH (ref 43–77)
RBC: 3.35 MIL/uL — ABNORMAL LOW (ref 4.22–5.81)
RDW: 20.8 % — ABNORMAL HIGH (ref 11.5–15.5)

## 2013-11-02 LAB — POCT I-STAT 3, ART BLOOD GAS (G3+)
Acid-Base Excess: 2 mmol/L (ref 0.0–2.0)
Bicarbonate: 25.4 meq/L — ABNORMAL HIGH (ref 20.0–24.0)
O2 Saturation: 100 %
Patient temperature: 98.2
TCO2: 26 mmol/L (ref 0–100)
pCO2 arterial: 34.1 mmHg — ABNORMAL LOW (ref 35.0–45.0)
pH, Arterial: 7.48 — ABNORMAL HIGH (ref 7.350–7.450)
pO2, Arterial: 267 mmHg — ABNORMAL HIGH (ref 80.0–100.0)

## 2013-11-02 LAB — I-STAT CHEM 8, ED
BUN: 28 mg/dL — ABNORMAL HIGH (ref 6–23)
CHLORIDE: 100 meq/L (ref 96–112)
CREATININE: 2.3 mg/dL — AB (ref 0.50–1.35)
Calcium, Ion: 1.16 mmol/L (ref 1.13–1.30)
GLUCOSE: 114 mg/dL — AB (ref 70–99)
HCT: 39 % (ref 39.0–52.0)
Hemoglobin: 13.3 g/dL (ref 13.0–17.0)
POTASSIUM: 4.7 meq/L (ref 3.7–5.3)
Sodium: 136 mEq/L — ABNORMAL LOW (ref 137–147)
TCO2: 30 mmol/L (ref 0–100)

## 2013-11-02 LAB — COMPREHENSIVE METABOLIC PANEL
ALT: 10 U/L (ref 0–53)
AST: 18 U/L (ref 0–37)
Albumin: 1 g/dL — ABNORMAL LOW (ref 3.5–5.2)
Alkaline Phosphatase: 275 U/L — ABNORMAL HIGH (ref 39–117)
BILIRUBIN TOTAL: 0.3 mg/dL (ref 0.3–1.2)
BUN: 23 mg/dL (ref 6–23)
CO2: 26 meq/L (ref 19–32)
CREATININE: 1.91 mg/dL — AB (ref 0.50–1.35)
Calcium: 7.9 mg/dL — ABNORMAL LOW (ref 8.4–10.5)
Chloride: 99 mEq/L (ref 96–112)
GFR calc Af Amer: 39 mL/min — ABNORMAL LOW (ref >90)
GFR, EST NON AFRICAN AMERICAN: 34 mL/min — AB (ref >90)
GLUCOSE: 118 mg/dL — AB (ref 70–99)
Potassium: 4.8 mEq/L (ref 3.7–5.3)
Sodium: 136 mEq/L — ABNORMAL LOW (ref 137–147)
Total Protein: 4.7 g/dL — ABNORMAL LOW (ref 6.0–8.3)

## 2013-11-02 LAB — GLUCOSE, CAPILLARY
GLUCOSE-CAPILLARY: 96 mg/dL (ref 70–99)
Glucose-Capillary: 68 mg/dL — ABNORMAL LOW (ref 70–99)

## 2013-11-02 LAB — MRSA PCR SCREENING: MRSA by PCR: NEGATIVE

## 2013-11-02 LAB — PROTIME-INR
INR: 1.26 (ref 0.00–1.49)
Prothrombin Time: 15.5 seconds — ABNORMAL HIGH (ref 11.6–15.2)

## 2013-11-02 SURGERY — LAPAROTOMY, EXPLORATORY
Anesthesia: General | Site: Abdomen | Laterality: Right

## 2013-11-02 MED ORDER — LIDOCAINE HCL (CARDIAC) 20 MG/ML IV SOLN
INTRAVENOUS | Status: AC
  Filled 2013-11-02: qty 5

## 2013-11-02 MED ORDER — ROCURONIUM BROMIDE 50 MG/5ML IV SOLN
INTRAVENOUS | Status: AC
  Filled 2013-11-02: qty 1

## 2013-11-02 MED ORDER — LIDOCAINE HCL (CARDIAC) 20 MG/ML IV SOLN
INTRAVENOUS | Status: DC | PRN
  Administered 2013-11-02: 100 mg via INTRAVENOUS

## 2013-11-02 MED ORDER — ROCURONIUM BROMIDE 100 MG/10ML IV SOLN
INTRAVENOUS | Status: DC | PRN
  Administered 2013-11-02: 20 mg via INTRAVENOUS

## 2013-11-02 MED ORDER — MIDAZOLAM HCL 2 MG/2ML IJ SOLN
INTRAMUSCULAR | Status: AC
  Filled 2013-11-02: qty 2

## 2013-11-02 MED ORDER — PHENYLEPHRINE HCL 10 MG/ML IJ SOLN
30.0000 ug/min | INTRAVENOUS | Status: DC
  Administered 2013-11-02: 30 ug/min via INTRAVENOUS
  Filled 2013-11-02: qty 1

## 2013-11-02 MED ORDER — ETOMIDATE 2 MG/ML IV SOLN
INTRAVENOUS | Status: DC | PRN
  Administered 2013-11-02: 18 mg via INTRAVENOUS

## 2013-11-02 MED ORDER — 0.9 % SODIUM CHLORIDE (POUR BTL) OPTIME
TOPICAL | Status: DC | PRN
  Administered 2013-11-02: 2500 mL

## 2013-11-02 MED ORDER — PANTOPRAZOLE SODIUM 40 MG IV SOLR
40.0000 mg | INTRAVENOUS | Status: DC
  Administered 2013-11-02 – 2013-11-07 (×6): 40 mg via INTRAVENOUS
  Filled 2013-11-02 (×9): qty 40

## 2013-11-02 MED ORDER — SUCCINYLCHOLINE CHLORIDE 20 MG/ML IJ SOLN
INTRAMUSCULAR | Status: DC | PRN
  Administered 2013-11-02: 100 mg via INTRAVENOUS

## 2013-11-02 MED ORDER — PHENYLEPHRINE HCL 10 MG/ML IJ SOLN
INTRAMUSCULAR | Status: DC | PRN
  Administered 2013-11-02: 80 ug via INTRAVENOUS

## 2013-11-02 MED ORDER — SUCCINYLCHOLINE CHLORIDE 20 MG/ML IJ SOLN
INTRAMUSCULAR | Status: AC
  Filled 2013-11-02: qty 1

## 2013-11-02 MED ORDER — MORPHINE SULFATE 2 MG/ML IJ SOLN
2.0000 mg | INTRAMUSCULAR | Status: DC | PRN
  Administered 2013-11-02 (×2): 2 mg via INTRAVENOUS
  Filled 2013-11-02 (×2): qty 1

## 2013-11-02 MED ORDER — FENTANYL CITRATE 0.05 MG/ML IJ SOLN
INTRAMUSCULAR | Status: AC
  Filled 2013-11-02: qty 5

## 2013-11-02 MED ORDER — INSULIN ASPART 100 UNIT/ML ~~LOC~~ SOLN
0.0000 [IU] | SUBCUTANEOUS | Status: DC
  Administered 2013-11-03 – 2013-11-07 (×6): 1 [IU] via SUBCUTANEOUS
  Administered 2013-11-08: 2 [IU] via SUBCUTANEOUS

## 2013-11-02 MED ORDER — ONDANSETRON HCL 4 MG PO TABS: 4.0000 mg | ORAL_TABLET | Freq: Four times a day (QID) | ORAL | Status: DC | PRN

## 2013-11-02 MED ORDER — FENTANYL BOLUS VIA INFUSION
25.0000 ug | INTRAVENOUS | Status: DC | PRN
  Filled 2013-11-02: qty 50

## 2013-11-02 MED ORDER — BIOTENE DRY MOUTH MT LIQD
15.0000 mL | Freq: Four times a day (QID) | OROMUCOSAL | Status: DC
  Administered 2013-11-03 – 2013-11-08 (×22): 15 mL via OROMUCOSAL

## 2013-11-02 MED ORDER — DEXTROSE 50 % IV SOLN
25.0000 mL | Freq: Once | INTRAVENOUS | Status: AC | PRN
  Administered 2013-11-03: 25 mL via INTRAVENOUS
  Filled 2013-11-02: qty 50

## 2013-11-02 MED ORDER — SODIUM CHLORIDE 0.9 % IV BOLUS (SEPSIS)
500.0000 mL | Freq: Once | INTRAVENOUS | Status: AC
  Administered 2013-11-02: 500 mL via INTRAVENOUS

## 2013-11-02 MED ORDER — METOPROLOL TARTRATE 1 MG/ML IV SOLN: 2.5000 mg | INTRAVENOUS | Status: DC | PRN

## 2013-11-02 MED ORDER — CHLORHEXIDINE GLUCONATE 0.12 % MT SOLN
15.0000 mL | Freq: Two times a day (BID) | OROMUCOSAL | Status: DC
  Administered 2013-11-02 – 2013-11-08 (×12): 15 mL via OROMUCOSAL
  Filled 2013-11-02 (×12): qty 15

## 2013-11-02 MED ORDER — FENTANYL CITRATE 0.05 MG/ML IJ SOLN
50.0000 ug | Freq: Once | INTRAMUSCULAR | Status: AC
  Administered 2013-11-02: 50 ug via INTRAVENOUS
  Filled 2013-11-02: qty 2

## 2013-11-02 MED ORDER — ETOMIDATE 2 MG/ML IV SOLN
INTRAVENOUS | Status: AC
  Filled 2013-11-02: qty 10

## 2013-11-02 MED ORDER — ONDANSETRON HCL 4 MG/2ML IJ SOLN
4.0000 mg | Freq: Four times a day (QID) | INTRAMUSCULAR | Status: DC | PRN
  Administered 2013-11-07: 4 mg via INTRAVENOUS
  Filled 2013-11-02: qty 2

## 2013-11-02 MED ORDER — SODIUM CHLORIDE 0.9 % IV SOLN
0.0000 ug/h | INTRAVENOUS | Status: DC
  Administered 2013-11-02: 10 ug/h via INTRAVENOUS
  Filled 2013-11-02: qty 50

## 2013-11-02 MED ORDER — DEXTROSE 5 % IV SOLN
1.0000 g | Freq: Four times a day (QID) | INTRAVENOUS | Status: DC
  Administered 2013-11-02: 1 g via INTRAVENOUS
  Filled 2013-11-02 (×3): qty 1

## 2013-11-02 MED ORDER — SODIUM CHLORIDE 0.9 % IV SOLN
INTRAVENOUS | Status: DC
  Administered 2013-11-02: 21:00:00 via INTRAVENOUS

## 2013-11-02 MED ORDER — PROPOFOL 10 MG/ML IV BOLUS
INTRAVENOUS | Status: AC
  Filled 2013-11-02: qty 20

## 2013-11-02 MED ORDER — SODIUM CHLORIDE 0.9 % IV SOLN
INTRAVENOUS | Status: DC | PRN
  Administered 2013-11-02: 19:00:00 via INTRAVENOUS

## 2013-11-02 MED ORDER — FENTANYL CITRATE 0.05 MG/ML IJ SOLN
INTRAMUSCULAR | Status: DC | PRN
  Administered 2013-11-02 (×2): 50 ug via INTRAVENOUS

## 2013-11-02 MED ORDER — DEXTROSE 5 % IV SOLN
2.0000 g | Freq: Two times a day (BID) | INTRAVENOUS | Status: DC
  Administered 2013-11-03 – 2013-11-06 (×6): 2 g via INTRAVENOUS
  Filled 2013-11-02 (×8): qty 2

## 2013-11-02 MED ORDER — SODIUM CHLORIDE 0.9 % IV BOLUS (SEPSIS)
250.0000 mL | Freq: Once | INTRAVENOUS | Status: AC
  Administered 2013-11-02: 250 mL via INTRAVENOUS

## 2013-11-02 SURGICAL SUPPLY — 47 items
BLADE SURG ROTATE 9660 (MISCELLANEOUS) IMPLANT
CANISTER SUCTION 2500CC (MISCELLANEOUS) ×4 IMPLANT
CHLORAPREP W/TINT 26ML (MISCELLANEOUS) ×4 IMPLANT
COVER MAYO STAND STRL (DRAPES) IMPLANT
COVER SURGICAL LIGHT HANDLE (MISCELLANEOUS) ×4 IMPLANT
DRAPE LAPAROSCOPIC ABDOMINAL (DRAPES) ×4 IMPLANT
DRAPE PROXIMA HALF (DRAPES) ×8 IMPLANT
DRAPE UTILITY 15X26 W/TAPE STR (DRAPE) ×8 IMPLANT
DRAPE WARM FLUID 44X44 (DRAPE) ×4 IMPLANT
DRSG OPSITE POSTOP 4X10 (GAUZE/BANDAGES/DRESSINGS) IMPLANT
DRSG OPSITE POSTOP 4X8 (GAUZE/BANDAGES/DRESSINGS) IMPLANT
DRSG VAC ATS MED SENSATRAC (GAUZE/BANDAGES/DRESSINGS) ×4 IMPLANT
ELECT BLADE 6.5 EXT (BLADE) IMPLANT
ELECT CAUTERY BLADE 6.4 (BLADE) ×8 IMPLANT
ELECT REM PT RETURN 9FT ADLT (ELECTROSURGICAL) ×4
ELECTRODE REM PT RTRN 9FT ADLT (ELECTROSURGICAL) ×2 IMPLANT
GLOVE BIO SURGEON STRL SZ7 (GLOVE) ×4 IMPLANT
GLOVE BIOGEL PI IND STRL 7.5 (GLOVE) ×2 IMPLANT
GLOVE BIOGEL PI INDICATOR 7.5 (GLOVE) ×2
GOWN STRL REUS W/ TWL LRG LVL3 (GOWN DISPOSABLE) ×6 IMPLANT
GOWN STRL REUS W/TWL LRG LVL3 (GOWN DISPOSABLE) ×6
KIT BASIN OR (CUSTOM PROCEDURE TRAY) ×4 IMPLANT
KIT OSTOMY DRAINABLE 2.75 STR (WOUND CARE) ×4 IMPLANT
KIT ROOM TURNOVER OR (KITS) ×4 IMPLANT
LIGASURE IMPACT 36 18CM CVD LR (INSTRUMENTS) IMPLANT
NS IRRIG 1000ML POUR BTL (IV SOLUTION) ×8 IMPLANT
PACK GENERAL/GYN (CUSTOM PROCEDURE TRAY) ×4 IMPLANT
PAD ARMBOARD 7.5X6 YLW CONV (MISCELLANEOUS) ×4 IMPLANT
PENCIL BUTTON HOLSTER BLD 10FT (ELECTRODE) ×4 IMPLANT
SPECIMEN JAR LARGE (MISCELLANEOUS) IMPLANT
SPONGE LAP 18X18 X RAY DECT (DISPOSABLE) IMPLANT
STAPLER VISISTAT 35W (STAPLE) ×4 IMPLANT
SUCTION POOLE TIP (SUCTIONS) ×4 IMPLANT
SUT ETHILON 2 0 FS 18 (SUTURE) ×4 IMPLANT
SUT NOVA NAB DX-16 0-1 5-0 T12 (SUTURE) ×4 IMPLANT
SUT PDS AB 1 TP1 96 (SUTURE) ×8 IMPLANT
SUT PROLENE 2 0 CT2 30 (SUTURE) ×4 IMPLANT
SUT SILK 2 0 SH CR/8 (SUTURE) ×4 IMPLANT
SUT SILK 2 0 TIES 10X30 (SUTURE) ×4 IMPLANT
SUT SILK 3 0 SH CR/8 (SUTURE) ×4 IMPLANT
SUT SILK 3 0 TIES 10X30 (SUTURE) ×4 IMPLANT
SUT VIC AB 3-0 SH 18 (SUTURE) IMPLANT
TOWEL OR 17X26 10 PK STRL BLUE (TOWEL DISPOSABLE) ×4 IMPLANT
TRAY FOLEY CATH 16FRSI W/METER (SET/KITS/TRAYS/PACK) IMPLANT
TUBE CONNECTING 12'X1/4 (SUCTIONS) ×1
TUBE CONNECTING 12X1/4 (SUCTIONS) ×3 IMPLANT
YANKAUER SUCT BULB TIP NO VENT (SUCTIONS) ×4 IMPLANT

## 2013-11-02 NOTE — ED Provider Notes (Signed)
  Face-to-face evaluation   History: Presents for evaluation of disembowelment. Subsequent to removal of suture from abdominal wound, today. He had a partial colectomy about one month ago. He denies straining prior to the disembowelment.   Physical exam: Elderly, alert, in mild pain. Mid-abdomen disembowelment, no bleeding or drainage.  Left groin Access for bleed by me using U/S guidance. 2 sticks and several passes required12 cc red blood obtained. Possible arterial, pressure held for 5 minutes.  Medical screening examination/treatment/procedure(s) were conducted as a shared visit with non-physician practitioner(s) and myself.  I personally evaluated the patient during the encounter  Richarda Blade, MD 11/03/13 [phone]

## 2013-11-02 NOTE — Anesthesia Preprocedure Evaluation (Signed)
Anesthesia Evaluation  Patient identified by MRN, date of birth, ID band Patient awake    Reviewed: Allergy & Precautions, H&P , NPO status , Patient's Chart, lab work & pertinent test results  Airway Mallampati: II      Dental   Pulmonary pneumonia -, COPDformer smoker,  breath sounds clear to auscultation        Cardiovascular hypertension, + CAD, + CABG, + Peripheral Vascular Disease and +CHF + dysrhythmias Atrial Fibrillation Rhythm:Regular Rate:Normal     Neuro/Psych    GI/Hepatic GERD-  ,  Endo/Other  diabetes, Type 2, Insulin Dependent  Renal/GU      Musculoskeletal   Abdominal   Peds  Hematology  (+) anemia , hgb 9.9   Anesthesia Other Findings   Reproductive/Obstetrics                           Anesthesia Physical Anesthesia Plan  ASA: IV and emergent  Anesthesia Plan: General ETT   Post-op Pain Management:    Induction: Intravenous  Airway Management Planned: Oral ETT  Additional Equipment: Arterial line  Intra-op Plan:   Post-operative Plan: Post-operative intubation/ventilation  Informed Consent:   Plan Discussed with: CRNA and Surgeon  Anesthesia Plan Comments:         Anesthesia Quick Evaluation

## 2013-11-02 NOTE — Op Note (Signed)
Preop diagnosis: Abdominal wall dehiscence with evisceration Postop diagnosis: Same Procedure performed: #1 exploratory laparotomy #2 lysis of adhesions #3 closure of abdominal wall  #4 placement of abdominal wound VAC (20 cm) #5 revision of ileostomy Surgeon:Ely Spragg K. Anesthesia: Gen. Endotracheal Indications: This is a 73 year old male with multiple severe medical comorbidities who is one month status post exploratory laparotomy right hemicolectomy and ileostomy for obstructing colon cancer with perforation of the right colon. The patient is dialysis dependent. When he returned to his nursing home today from dialysis, he was found to have eviscerated small bowel through his midline abdominal wound. He was emergently brought to the muscles can't emerged department. We brought him directly to the operating room for exploration. The patient is hemodynamically stable.  Description of procedure: The patient brought to the operating room and placed in a supine position on the operating room table. After an adequate level of general anesthesia was obtained the dressing was removed from his abdomen. The ileostomy was temporarily closed with a pursestring suture of 2-0 Prolene. The entire abdominal wall was prepped with Betadine including the exposed bowel. We draped in sterile fashion. I was able to reduce the small bowel easily into the abdomen. There is no sign of perforation of the small bowel. There is a segment of small bowel and omentum that is densely adherent to the age of the abdominal wall opening on the right. We were able to carefully dissect this away from the abdominal wall. There were some adhesions of the small bowel that were lysed with Metzenbaum scissors. There's no sign of perforation or intra-abdominal abscess. The patient has minimal intraloop adhesions. We irrigated the abdomen thoroughly with warm saline. Cautery was used toDissect the fascia away from the overlying subcutaneous  tissues. The fascia was then reapproximated with multiple interrupted figure-of-eight #1 Novafil sutures. When the fascia was completely closed we irrigated the subcutaneous tissues. A medium VAC sponge was cut to fit the wound with a total area of about 20 cm. The fact dressing was placed and connected to suction with good seal. I then examined his ileostomy. I removed a Pershing suture. The edges of the ileostomy have pulled away from the surrounding skin. 3-0 Vicryl sutures were used to reattach the edges of the mucosa to the surrounding skin. An ostomy appliance was cut to fit and attached to the abdominal wall. The patient was then transported to the intensive care unit while intubated. Critical care medicine has been consulted.  Imogene Burn. Georgette Dover, MD, [facility] Surgery  General/ Trauma Surgery  November 24, 2013 8:50 PM

## 2013-11-02 NOTE — Telephone Encounter (Signed)
Nursing home called to make Dr. Grandville Silos aware that they are transporting patient to ED at this time due to his "intestines hanging outside his body".  Explained I would send him a message so he can review patient's chart.

## 2013-11-02 NOTE — Anesthesia Postprocedure Evaluation (Signed)
  Anesthesia Post-op Note  Patient: Nicholas [name]  Procedure(s) Performed: Procedure(s): EXPLORATORY LAPAROTOMY with Closure of Abdominal Wall (N/A) APPLICATION OF WOUND VAC (N/A) Ileostomy REVISION (Right)  Patient Location: SICU  Anesthesia Type:General  Level of Consciousness: sedated, unresponsive and Patient remains intubated per anesthesia plan  Airway and Oxygen Therapy: Patient remains intubated per anesthesia plan  Post-op Pain: none  Post-op Assessment: Post-op Vital signs reviewed and Patient's Cardiovascular Status Stable  Post-op Vital Signs: stable  Complications: No apparent anesthesia complications

## 2013-11-02 NOTE — Transfer of Care (Signed)
Immediate Anesthesia Transfer of Care Note  Patient: Nicholas [name]  Procedure(s) Performed: Procedure(s): EXPLORATORY LAPAROTOMY with Closure of Abdominal Wall (N/A) APPLICATION OF WOUND VAC (N/A) Ileostomy REVISION (Right)  Patient Location: ICU  Anesthesia Type:General  Level of Consciousness: sedated and Patient remains intubated per anesthesia plan  Airway & Oxygen Therapy: Patient remains intubated per anesthesia plan and Patient placed on Ventilator (see vital sign flow sheet for setting)  Post-op Assessment: Report given to PACU RN and Post -op Vital signs reviewed and stable  Post vital signs: Reviewed and stable  Complications: No apparent anesthesia complications

## 2013-11-02 NOTE — Progress Notes (Signed)
Subjective:     Patient ID: Nicholas [name], male   DOB: 02/27/1941, 73 y.o.   MRN: [id number]  HPI Patient presents status post right colectomy and ileostomy for colon cancer. He is quite deconditioned. He is at [facility] skilled nursing. He is on hemodialysis 3 times a week. He is followed by Dr. Alen Blew.from the cancer center and is not a chemotherapy candidate.  Review of Systems     Objective:   Physical Exam In wheelchair Abdomen soft and nontender, ileostomy with good output, VAC was removed from her midline wound. Some free PDS sutures were debrided. There was some fibrinous exudate and necrotic tissue on the right side which was debrided. Wet-to-dry dressing applied.    Assessment:     Progressing status post right colectomy and ileostomy for colon cancer    Plan:     Discontinue VAC. Wet-to-dry dressings. I think this will allow better seal of his ileostomy appliance. I will see him back in 2 weeks.

## 2013-11-02 NOTE — H&P (Signed)
Nicholas [name] is an 73 y.o. male.   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  The patient has multiple comorbidities - CAD, ESRD on HD, recent C. Diff.  He was seen in the office earlier today from his skilled nursing facility and the [facility] was removed.  A wet to dry dressing was applied.  The nursing home later noticed that he had a large amount of small bowel protruding from his abdominal wound.  He was transported emergently to the ED.  He states that he did dialyze earlier today.  Past Medical History  Diagnosis Date  . Coronary artery disease     a. hx of MI 1992; b. 10/2012 NSTEMI (no cath 2/2 GIB);  c. 11/2012 MV: Inf defect w inf and septal HK (favor prior MI), EF 47%;  d. 12/2012 Cath: Severe 3vd, EF 45-50%;  e. 01/2013 CABG x 5 (LIMA->LAD, VG->D1, VG->OM1->OM2, VG->PDA)  . Diabetes mellitus   . Hypertension   . Arthritis   . Carotid artery disease     a. 01/2013 doppler: >80% RICA stenosis, XX123456 LICA stenosis;  b. [id number] R CEA.  . Gastritis 2012 and 09/2011    treated for H Pylori in 12/2011  . Diabetic neuropathy   . Osteoporosis   . COPD (chronic obstructive pulmonary disease)   . Anxiety   . Depression   . ESRD (end stage renal disease)     M-W-Sa  [name]  . Diabetic nephropathy   . GERD (gastroesophageal reflux disease)   . Neuromuscular disorder     peripheral neurotherapy  . Anemia   . A-fib     a. Dx 10/2012, on amio, no anticoagulation 2/2 GIB 10/2012.  [name] GIB (gastrointestinal bleeding)     a. 10/2012, awaiting colonoscopy.    Past Surgical History  Procedure Laterality Date  . Coronary stent placement    . Hip surgery      both hips  . Av fistula placement      Left arm  . Cardiac catheterization    . Joint replacement Bilateral   . Eye surgery Bilateral     cararacts  . Coronary artery bypass graft N/A  02/03/2013    Procedure: CORONARY ARTERY BYPASS GRAFTING (CABG);  Surgeon: Melrose Nakayama, MD;  Location: [place];  Service: Open Heart Surgery;  Laterality: N/A;  . Endarterectomy N/A 02/03/2013    Procedure: ENDARTERECTOMY CAROTID;  Surgeon: Mal Misty, MD;  Location: [place];  Service: Vascular;  Laterality: N/A;  . Olecranon bursectomy Right 07/28/2013    Procedure: RIGHT ELBOW OLECRANON BURSECTOMY ADVANCEMENT CLOSURE ;  Surgeon: Linna Hoff, MD;  Location: [place];  Service: Orthopedics;  Laterality: Right;  . Colonoscopy N/A 09/29/2013    Procedure: COLONOSCOPY;  Surgeon: Cleotis Nipper, MD;  Location: [facility] ENDOSCOPY;  Service: Endoscopy;  Laterality: N/A;  CO2  ultra-slim scope if available  . Partial colectomy Right 09/29/2013    Procedure: PARTIAL COLECTOMY;  Surgeon: Zenovia Jarred, MD;  Location: [place];  Service: General;  Laterality: Right;  . Colostomy N/A 09/29/2013    Procedure: COLOSTOMY;  Surgeon: Zenovia Jarred, MD;  Location: [facility];  Service: General;  Laterality: N/A;    Family History  Problem Relation Age of Onset  . Heart attack Father 27  . Heart disease Father   . Stroke Mother   .  Hypertension Mother   . Other Mother     varicose veins  . Stroke Maternal Grandmother   . Hypertension Brother    Social History:  reports that he quit smoking about 23 years ago. His smoking use included Cigarettes. He started smoking about 48 years ago. He has a 25 pack-year smoking history. He has never used smokeless tobacco. He reports that he does not drink alcohol or use illicit drugs.  Allergies:  Allergies  Allergen Reactions  . Statins Other (See Comments)    Muscle weakness      Prior to Admission medications   Medication Sig Start Date End Date Taking? Authorizing Provider  acetaminophen (TYLENOL) 500 MG tablet Take 500 mg by mouth every 4 (four) hours as needed (pain).     Historical Provider, MD  allopurinol (ZYLOPRIM) 100 MG tablet Take 1 tablet (100 mg  total) by mouth daily. 10/11/13   Orson Eva, MD  Amino Acids-Protein Hydrolys (FEEDING SUPPLEMENT, PRO-STAT SUGAR FREE 64,) LIQD Take 30 mLs by mouth 2 (two) times daily with a meal.    Historical Provider, MD  amiodarone (PACERONE) 200 MG tablet Take 200 mg by mouth daily.    Historical Provider, MD  ascorbic acid (VITAMIN C) 500 MG tablet Take 500 mg by mouth daily. X 60 days 10/16/13 12/15/13  Historical Provider, MD  aspirin EC 81 MG tablet Take 1 tablet (81 mg total) by mouth daily. 10/11/13   Orson Eva, MD  bisacodyl (DULCOLAX) 10 MG suppository Place 1 suppository (10 mg total) rectally daily as needed for moderate constipation (gas pain). 09/20/13   Consuella Lose, MD  calcitonin, salmon, (MIACALCIN/FORTICAL) 200 UNIT/ACT nasal spray Place 1 spray into alternate nostrils daily as needed.    Historical Provider, MD  darbepoetin (ARANESP) 60 MCG/0.3ML SOLN injection Inject 0.3 mLs (60 mcg total) into the vein every Monday with hemodialysis. 09/20/13   Consuella Lose, MD  doxercalciferol (HECTOROL) 4 MCG/2ML injection Inject 1 mL (2 mcg total) into the vein every Monday, Wednesday, and Friday with hemodialysis. 09/20/13   Consuella Lose, MD  feeding supplement, RESOURCE BREEZE, (RESOURCE BREEZE) LIQD Take 1 Container by mouth daily. 09/20/13   Consuella Lose, MD  fish oil-omega-3 fatty acids 1000 MG capsule Take 1 g by mouth 2 (two) times daily.     Historical Provider, MD  gabapentin (NEURONTIN) 300 MG capsule Take 1 capsule (300 mg total) by mouth 2 (two) times daily. 02/15/13   Donielle Liston Alba, PA-C  HYDROcodone-acetaminophen (NORCO/VICODIN) 5-325 MG per tablet Take 1 tablet by mouth 2 (two) times daily.    Historical Provider, MD  insulin aspart (NOVOLOG) 100 UNIT/ML injection Inject 0-9 Units into the skin 3 (three) times daily with meals. 09/20/13   Consuella Lose, MD  ipratropium (ATROVENT) 0.06 % nasal spray Place 2 sprays into both nostrils 3 (three) times daily.     Historical  Provider, MD  IPRATROPIUM BROMIDE IN Inhale 0.6 % into the lungs 3 (three) times daily.    Historical Provider, MD  lidocaine (LIDODERM) 5 % Place 1 patch onto the skin daily. Remove & Discard patch within 12 hours or as directed by MD    Historical Provider, MD  magnesium hydroxide (MILK OF MAGNESIA) 400 MG/5ML suspension Take by mouth every 12 (twelve) hours as needed for mild constipation.    Historical Provider, MD  methocarbamol (ROBAXIN) 750 MG tablet Take 1 tablet (750 mg total) by mouth every 6 (six) hours as needed for muscle spasms. 10/11/13   Shanon Brow  Tat, MD  metoCLOPramide (REGLAN) 5 MG tablet Take 5 mg by mouth 3 (three) times daily.    Historical Provider, MD  midodrine (PROAMATINE) 5 MG tablet Take 5 mg by mouth 3 (three) times a week. Take once daily in the morning on dialysis days (usually on Monday, Wednesday and Friday)    Historical Provider, MD  multivitamin (RENA-VIT) TABS tablet Take 1 tablet by mouth at bedtime. 09/20/13   Consuella Lose, MD  Nutritional Supplements (FEEDING SUPPLEMENT, NEPRO CARB STEADY,) LIQD Take 237 mLs by mouth 2 (two) times daily. 09/20/13   Consuella Lose, MD  omeprazole (PRILOSEC) 20 MG capsule Take 20 mg by mouth 2 (two) times daily.    Historical Provider, MD  oxyCODONE (OXY IR/ROXICODONE) 5 MG immediate release tablet Take 1-2 tablets (5-10 mg total) by mouth every 4 (four) hours as needed for moderate pain or severe pain. 10/11/13   Orson Eva, MD  OxyCODONE (OXYCONTIN) 20 mg T12A 12 hr tablet Take 1 tablet (20 mg total) by mouth every 12 (twelve) hours. 10/11/13   Orson Eva, MD  pantoprazole (PROTONIX) 40 MG tablet Take 40 mg by mouth daily.    Historical Provider, MD  polyethylene glycol (MIRALAX / GLYCOLAX) packet Take 17 g by mouth daily.    Historical Provider, MD  promethazine (PHENERGAN) 25 MG tablet Take 25 mg by mouth every 12 (twelve) hours as needed for nausea or vomiting.    Historical Provider, MD  saccharomyces boulardii (FLORASTOR) 250 MG  capsule Take 1 capsule (250 mg total) by mouth 2 (two) times daily. 09/20/13   Consuella Lose, MD  senna (SENOKOT) 8.6 MG TABS tablet Take 1 tablet by mouth 2 (two) times daily.    Historical Provider, MD  sevelamer carbonate (RENVELA) 800 MG tablet Take 800 mg by mouth 3 (three) times daily with meals.    Historical Provider, MD  simethicone (MYLICON) 80 MG chewable tablet Chew 2 tablets (160 mg total) by mouth 4 (four) times daily as needed for flatulence. 09/20/13   Consuella Lose, MD  sodium chloride 0.9 % SOLN 100 mL with ferric gluconate 12.5 MG/ML SOLN 62.5 mg Inject 62.5 mg into the vein every Monday, Wednesday, and Friday with hemodialysis. 10/11/13   Orson Eva, MD  sorbitol 70 % solution Take 15 mLs by mouth daily as needed.    Historical Provider, MD  traMADol (ULTRAM) 50 MG tablet Take 1 tablet (50 mg total) by mouth every 12 (twelve) hours. 10/11/13   Orson Eva, MD  Witch Hazel (PREPARATION H EX) Apply 1 application topically every 6 (six) hours as needed (hemorrhoids).    Historical Provider, MD  zinc sulfate 220 MG capsule Take 220 mg by mouth daily. X 60 days from start date 10/16/13 12/15/13  Historical Provider, MD   Labs pending.  ROS Constitutional: positive for fatigue and weight loss, negative for chills, fevers and night sweats  Eyes: negative for icterus, irritation and redness  Ears, nose, mouth, throat, and face: negative for ear drainage, sore throat and tinnitus  Respiratory: positive for dyspnea on exertion, negative for sputum, stridor and wheezing  Cardiovascular: negative for chest pain, chest pressure/discomfort, lower extremity edema, orthopnea and palpitations  Gastrointestinal: positive for abdominal pain, negative for diarrhea, dysphagia, jaundice, melena and vomiting  Genitourinary:negative for dysuria, frequency and hematuria  Integument/breast: negative for pruritus, rash and skin color change  Hematologic/lymphatic: negative for bleeding, easy bruising and  lymphadenopathy  Musculoskeletal:negative for arthralgias and stiff joints  Neurological: negative for headaches and seizures  Behavioral/Psych: negative for anxiety and depression  Endocrine: negative for temperature intolerance  Allergic/Immunologic: negative for angioedema and urticaria   Blood pressure 93/64, pulse 105, temperature 97.8 F (36.6 C), temperature source Oral, resp. rate 18, SpO2 100.00%. Physical Exam  Elderly male in NAD Abd - open abdominal wound with about 18 inches of protruding small bowel; no sign of fistula at this time.  One segment of small bowel seems dessicated and thickened, but no perforation noted yet. No peritoneal signs  Assessment/Plan Evisceration after complete fascial dehiscence  To OR emergently for exploratory laparotomy and closure of his abdominal wall.  The patient is at high risk for fistula due to the exposed small bowel.    We will go ahead and admit him to [facility] in the interest of time, so we can get him to the OR quickly.  He will likely need to be transferred to [facility] or [facility] post-op to manage all of his other medical problems and to work on placement back at his nursing home, but there is no time to wait for a consult from them right now.  He was on the hospitalist service during his recent admission, discharged 10/12/13.   Nicholas [name] K. 10/21/2013, 6:11 PM

## 2013-11-02 NOTE — Telephone Encounter (Signed)
Incoming call from the answering service to call patient with directions to our facility.  Called Patient back @ [phone] and gave directions to our office.  Patient has a 9:00 appointment today with Dr. Grandville Silos.

## 2013-11-02 NOTE — Anesthesia Procedure Notes (Signed)
Procedure Name: Intubation Date/Time: 10/30/2013 6:47 PM Performed by: Claris Che Pre-anesthesia Checklist: Patient identified, Emergency Drugs available, Suction available and Patient being monitored Patient Re-evaluated:Patient Re-evaluated prior to inductionOxygen Delivery Method: Circle system utilized Preoxygenation: Pre-oxygenation with 100% oxygen Intubation Type: Cricoid Pressure applied, Rapid sequence and IV induction Ventilation: Mask ventilation without difficulty Laryngoscope Size: 4 and Mac Grade View: Grade I Tube type: Subglottic suction tube Tube size: 7.5 mm Number of attempts: 1 Airway Equipment and Method: Stylet Placement Confirmation: ETT inserted through vocal cords under direct vision,  positive ETCO2 and breath sounds checked- equal and bilateral Secured at: 24 cm Tube secured with: Tape Dental Injury: Teeth and Oropharynx as per pre-operative assessment

## 2013-11-02 NOTE — ED Notes (Signed)
PA at bedside.

## 2013-11-02 NOTE — ED Provider Notes (Signed)
CSN: [id number]     Arrival date & time 10/30/2013  1751 History   First MD Initiated Contact with Patient 10/21/2013 1756     Chief Complaint  Patient presents with  . Post-op Problem     (Consider location/radiation/quality/duration/timing/severity/associated sxs/prior Treatment) HPI Comments: Patient presents to the emergency department with chief complaint of postoperative complication. He reports that after receiving dialysis today, he went to his PCP to have sutures removed from prior abdominal surgery. Upon removal, the incision opened, and part of his bowel protruded from the wound. Moistened gauze was applied to the area. He states his pain is 8/10. He denies any other complaints.  The history is provided by the patient. No language interpreter was used.    Past Medical History  Diagnosis Date  . Coronary artery disease     a. hx of MI 1992; b. 10/2012 NSTEMI (no cath 2/2 GIB);  c. 11/2012 MV: Inf defect w inf and septal HK (favor prior MI), EF 47%;  d. 12/2012 Cath: Severe 3vd, EF 45-50%;  e. 01/2013 CABG x 5 (LIMA->LAD, VG->D1, VG->OM1->OM2, VG->PDA)  . Diabetes mellitus   . Hypertension   . Arthritis   . Carotid artery disease     a. 01/2013 doppler: >80% RICA stenosis, <50% LICA stenosis;  b. 09/7739 R CEA.  . Gastritis 2012 and 09/2011    treated for H Pylori in 12/2011  . Diabetic neuropathy   . Osteoporosis   . COPD (chronic obstructive pulmonary disease)   . Anxiety   . Depression   . ESRD (end stage renal disease)     M-W-Sa  [name]  . Diabetic nephropathy   . GERD (gastroesophageal reflux disease)   . Neuromuscular disorder     peripheral neurotherapy  . Anemia   . A-fib     a. Dx 10/2012, on amio, no anticoagulation 2/2 GIB 10/2012.  [name] GIB (gastrointestinal bleeding)     a. 10/2012, awaiting colonoscopy.   Past Surgical History  Procedure Laterality Date  . Coronary stent placement    . Hip surgery      both hips  . Av fistula placement      Left arm   . Cardiac catheterization    . Joint replacement Bilateral   . Eye surgery Bilateral     cararacts  . Coronary artery bypass graft N/A 02/03/2013    Procedure: CORONARY ARTERY BYPASS GRAFTING (CABG);  Surgeon: Melrose Nakayama, MD;  Location: [place];  Service: Open Heart Surgery;  Laterality: N/A;  . Endarterectomy N/A 02/03/2013    Procedure: ENDARTERECTOMY CAROTID;  Surgeon: Mal Misty, MD;  Location: [place];  Service: Vascular;  Laterality: N/A;  . Olecranon bursectomy Right 07/28/2013    Procedure: RIGHT ELBOW OLECRANON BURSECTOMY ADVANCEMENT CLOSURE ;  Surgeon: Linna Hoff, MD;  Location: [place];  Service: Orthopedics;  Laterality: Right;  . Colonoscopy N/A 09/29/2013    Procedure: COLONOSCOPY;  Surgeon: Cleotis Nipper, MD;  Location: [facility] ENDOSCOPY;  Service: Endoscopy;  Laterality: N/A;  CO2  ultra-slim scope if available  . Partial colectomy Right 09/29/2013    Procedure: PARTIAL COLECTOMY;  Surgeon: Zenovia Jarred, MD;  Location: [place];  Service: General;  Laterality: Right;  . Colostomy N/A 09/29/2013    Procedure: COLOSTOMY;  Surgeon: Zenovia Jarred, MD;  Location: [facility] OR;  Service: General;  Laterality: N/A;   Family History  Problem Relation Age of Onset  . Heart attack Father 20  . Heart disease  Father   . Stroke Mother   . Hypertension Mother   . Other Mother     varicose veins  . Stroke Maternal Grandmother   . Hypertension Brother    History  Substance Use Topics  . Smoking status: Former Smoker -- 1.00 packs/day for 25 years    Types: Cigarettes    Start date: 08/18/1965    Quit date: 08/18/1990  . Smokeless tobacco: Never Used  . Alcohol Use: No     Comment: occasionally    Review of Systems  All other systems reviewed and are negative.      Allergies  Statins  Home Medications   Current Outpatient Rx  Name  Route  Sig  Dispense  Refill  . acetaminophen (TYLENOL) 500 MG tablet   Oral   Take 500 mg by mouth every 4 (four) hours as  needed (pain).          [name] allopurinol (ZYLOPRIM) 100 MG tablet   Oral   Take 1 tablet (100 mg total) by mouth daily.   30 tablet   0   . Amino Acids-Protein Hydrolys (FEEDING SUPPLEMENT, PRO-STAT SUGAR FREE 64,) LIQD   Oral   Take 30 mLs by mouth 2 (two) times daily with a meal.         . amiodarone (PACERONE) 200 MG tablet   Oral   Take 200 mg by mouth daily.         [name] ascorbic acid (VITAMIN C) 500 MG tablet   Oral   Take 500 mg by mouth daily. X 60 days         . aspirin EC 81 MG tablet   Oral   Take 1 tablet (81 mg total) by mouth daily.         . bisacodyl (DULCOLAX) 10 MG suppository   Rectal   Place 1 suppository (10 mg total) rectally daily as needed for moderate constipation (gas pain).   12 suppository   0   . calcitonin, salmon, (MIACALCIN/FORTICAL) 200 UNIT/ACT nasal spray   Alternating Nares   Place 1 spray into alternate nostrils daily as needed.         . darbepoetin (ARANESP) 60 MCG/0.3ML SOLN injection   Intravenous   Inject 0.3 mLs (60 mcg total) into the vein every Monday with hemodialysis.   4.2 mL   3   . doxercalciferol (HECTOROL) 4 MCG/2ML injection   Intravenous   Inject 1 mL (2 mcg total) into the vein every Monday, Wednesday, and Friday with hemodialysis.   2 mL   3   . feeding supplement, RESOURCE BREEZE, (RESOURCE BREEZE) LIQD   Oral   Take 1 Container by mouth daily.   30 Container   3   . fish oil-omega-3 fatty acids 1000 MG capsule   Oral   Take 1 g by mouth 2 (two) times daily.          [name] gabapentin (NEURONTIN) 300 MG capsule   Oral   Take 1 capsule (300 mg total) by mouth 2 (two) times daily.         [name] HYDROcodone-acetaminophen (NORCO/VICODIN) 5-325 MG per tablet   Oral   Take 1 tablet by mouth 2 (two) times daily.         . insulin aspart (NOVOLOG) 100 UNIT/ML injection   Subcutaneous   Inject 0-9 Units into the skin 3 (three) times daily with meals.   10 mL   11   . ipratropium (ATROVENT) 0.06 %  nasal spray   Each Nare   Place 2 sprays into both nostrils 3 (three) times daily.          . IPRATROPIUM BROMIDE IN   Inhalation   Inhale 0.6 % into the lungs 3 (three) times daily.         [name] lidocaine (LIDODERM) 5 %   Transdermal   Place 1 patch onto the skin daily. Remove & Discard patch within 12 hours or as directed by MD         . magnesium hydroxide (MILK OF MAGNESIA) 400 MG/5ML suspension   Oral   Take by mouth every 12 (twelve) hours as needed for mild constipation.         . methocarbamol (ROBAXIN) 750 MG tablet   Oral   Take 1 tablet (750 mg total) by mouth every 6 (six) hours as needed for muscle spasms.   50 tablet   0   . metoCLOPramide (REGLAN) 5 MG tablet   Oral   Take 5 mg by mouth 3 (three) times daily.         . midodrine (PROAMATINE) 5 MG tablet   Oral   Take 5 mg by mouth 3 (three) times a week. Take once daily in the morning on dialysis days (usually on Monday, Wednesday and Friday)         . multivitamin (RENA-VIT) TABS tablet   Oral   Take 1 tablet by mouth at bedtime.   30 tablet   3   . Nutritional Supplements (FEEDING SUPPLEMENT, NEPRO CARB STEADY,) LIQD   Oral   Take 237 mLs by mouth 2 (two) times daily.   30 Can   3   . omeprazole (PRILOSEC) 20 MG capsule   Oral   Take 20 mg by mouth 2 (two) times daily.         [name] oxyCODONE (OXY IR/ROXICODONE) 5 MG immediate release tablet   Oral   Take 1-2 tablets (5-10 mg total) by mouth every 4 (four) hours as needed for moderate pain or severe pain.   60 tablet   0   . OxyCODONE (OXYCONTIN) 20 mg T12A 12 hr tablet   Oral   Take 1 tablet (20 mg total) by mouth every 12 (twelve) hours.   60 tablet   0   . pantoprazole (PROTONIX) 40 MG tablet   Oral   Take 40 mg by mouth daily.         . polyethylene glycol (MIRALAX / GLYCOLAX) packet   Oral   Take 17 g by mouth daily.         . promethazine (PHENERGAN) 25 MG tablet   Oral   Take 25 mg by mouth every 12 (twelve) hours  as needed for nausea or vomiting.         [name] saccharomyces boulardii (FLORASTOR) 250 MG capsule   Oral   Take 1 capsule (250 mg total) by mouth 2 (two) times daily.   30 capsule   0   . senna (SENOKOT) 8.6 MG TABS tablet   Oral   Take 1 tablet by mouth 2 (two) times daily.         . sevelamer carbonate (RENVELA) 800 MG tablet   Oral   Take 800 mg by mouth 3 (three) times daily with meals.         . simethicone (MYLICON) 80 MG chewable tablet   Oral   Chew 2 tablets (160 mg total) by mouth 4 (four) times  daily as needed for flatulence.   30 tablet   0   . sodium chloride 0.9 % SOLN 100 mL with ferric gluconate 12.5 MG/ML SOLN 62.5 mg   Intravenous   Inject 62.5 mg into the vein every Monday, Wednesday, and Friday with hemodialysis.   1 vial   0   . sorbitol 70 % solution   Oral   Take 15 mLs by mouth daily as needed.         . traMADol (ULTRAM) 50 MG tablet   Oral   Take 1 tablet (50 mg total) by mouth every 12 (twelve) hours.   60 tablet   0   . Witch Hazel (PREPARATION H EX)   Apply externally   Apply 1 application topically every 6 (six) hours as needed (hemorrhoids).         . zinc sulfate 220 MG capsule   Oral   Take 220 mg by mouth daily. X 60 days from start date          BP 93/64  Pulse 105  Temp(Src) 97.8 F (36.6 C) (Oral)  Resp 18  SpO2 100% Physical Exam  Nursing note and vitals reviewed. Constitutional: He is oriented to person, place, and time. He appears well-developed and well-nourished.  HENT:  Head: Normocephalic and atraumatic.  Eyes: Conjunctivae and EOM are normal. Pupils are equal, round, and reactive to light. Right eye exhibits no discharge. Left eye exhibits no discharge. No scleral icterus.  Neck: Normal range of motion. Neck supple. No JVD present.  Cardiovascular: Normal rate, regular rhythm and normal heart sounds.  Exam reveals no gallop and no friction rub.   No murmur heard. Pulmonary/Chest: Effort normal and  breath sounds normal. No respiratory distress. He has no wheezes. He has no rales. He exhibits no tenderness.  Abdominal: Soft. He exhibits no distension and no mass. There is no tenderness. There is no rebound and no guarding.  2-3 strands of bowel emerging from left lower quadrant, covered in moistened gauze  Colostomy bag in right lower quadrant  Musculoskeletal: Normal range of motion. He exhibits no edema and no tenderness.  Neurological: He is alert and oriented to person, place, and time.  Skin: Skin is warm and dry.  Psychiatric: He has a normal mood and affect. His behavior is normal. Judgment and thought content normal.    ED Course  Procedures (including critical care time) Labs Review Labs Reviewed  CBC WITH DIFFERENTIAL  COMPREHENSIVE METABOLIC PANEL  PROTIME-INR  I-STAT CHEM 8, ED   Imaging Review No results found.   EKG Interpretation None      MDM   Final diagnoses:  Evisceration of bowel    Patient with partial disembowelment following removal of abdominal sutures. Patient seen by and discussed with Dr. Eulis Foster. Dr. Georgette Dover from surgery his bedside. Patient will go directly to the OR.    Montine Circle, PA-C 11/03/13 1057

## 2013-11-02 NOTE — Progress Notes (Signed)
eLink Physician-Brief Progress Note Patient Name: LISSANDRO DILORENZO DOB: 05/28/1941 MRN: [id number]  Date of Service  10/29/2013   HPI/Events of Note   Post op vent  eICU Interventions  Vent orders fent gtt lopressor prn with BP parameters FLuid bolus if low BP (note on midodrine)   Intervention Category Major Interventions: Other:  [name],[name] 10/16/2013, 9:34 PM

## 2013-11-02 NOTE — ED Notes (Signed)
Per ems, pt from [facility]. Pt did have dialysis today. Went to PCP to get stitches removed this morning from abdominal surgery. Abdominal incision popped open and part of bowel is visible. EMS applied soaked gauze to area. Pt is in NAD. Pt was given 20mg  oxycotin PO at 1640 at [facility]. Pressure 75/43. Normally hypotensive following dialysis. Pain initially 10/10, now 7/10. Pt is alert.

## 2013-11-03 ENCOUNTER — Inpatient Hospital Stay (HOSPITAL_COMMUNITY): Payer: Medicare Other

## 2013-11-03 DIAGNOSIS — J9600 Acute respiratory failure, unspecified whether with hypoxia or hypercapnia: Secondary | ICD-10-CM

## 2013-11-03 DIAGNOSIS — I2510 Atherosclerotic heart disease of native coronary artery without angina pectoris: Secondary | ICD-10-CM

## 2013-11-03 DIAGNOSIS — N186 End stage renal disease: Secondary | ICD-10-CM

## 2013-11-03 DIAGNOSIS — K439 Ventral hernia without obstruction or gangrene: Secondary | ICD-10-CM

## 2013-11-03 DIAGNOSIS — I959 Hypotension, unspecified: Secondary | ICD-10-CM

## 2013-11-03 DIAGNOSIS — J189 Pneumonia, unspecified organism: Secondary | ICD-10-CM

## 2013-11-03 LAB — GLUCOSE, CAPILLARY
GLUCOSE-CAPILLARY: 113 mg/dL — AB (ref 70–99)
GLUCOSE-CAPILLARY: 127 mg/dL — AB (ref 70–99)
GLUCOSE-CAPILLARY: 128 mg/dL — AB (ref 70–99)
GLUCOSE-CAPILLARY: 94 mg/dL (ref 70–99)
Glucose-Capillary: 100 mg/dL — ABNORMAL HIGH (ref 70–99)
Glucose-Capillary: 103 mg/dL — ABNORMAL HIGH (ref 70–99)
Glucose-Capillary: 128 mg/dL — ABNORMAL HIGH (ref 70–99)

## 2013-11-03 LAB — POCT I-STAT 3, ART BLOOD GAS (G3+)
Acid-base deficit: 5 mmol/L — ABNORMAL HIGH (ref 0.0–2.0)
BICARBONATE: 19.8 meq/L — AB (ref 20.0–24.0)
O2 Saturation: 98 %
PH ART: 7.353 (ref 7.350–7.450)
Patient temperature: 99.7
TCO2: 21 mmol/L (ref 0–100)
pCO2 arterial: 35.9 mmHg (ref 35.0–45.0)
pO2, Arterial: 107 mmHg — ABNORMAL HIGH (ref 80.0–100.0)

## 2013-11-03 LAB — CBC
HCT: 40.8 % (ref 39.0–52.0)
HCT: 41.7 % (ref 39.0–52.0)
HEMOGLOBIN: 12.7 g/dL — AB (ref 13.0–17.0)
HEMOGLOBIN: 12.7 g/dL — AB (ref 13.0–17.0)
MCH: 33.9 pg (ref 26.0–34.0)
MCH: 34.2 pg — AB (ref 26.0–34.0)
MCHC: 30.5 g/dL (ref 30.0–36.0)
MCHC: 31.1 g/dL (ref 30.0–36.0)
MCV: 111.2 fL — ABNORMAL HIGH (ref 78.0–100.0)
MCV: 110 fL — AB (ref 78.0–100.0)
Platelets: 297 10*3/uL (ref 150–400)
Platelets: 315 10*3/uL (ref 150–400)
RBC: 3.71 MIL/uL — ABNORMAL LOW (ref 4.22–5.81)
RBC: 3.75 MIL/uL — ABNORMAL LOW (ref 4.22–5.81)
RDW: 20.7 % — ABNORMAL HIGH (ref 11.5–15.5)
WBC: 16.4 10*3/uL — ABNORMAL HIGH (ref 4.0–10.5)
WBC: 20.3 10*3/uL — ABNORMAL HIGH (ref 4.0–10.5)

## 2013-11-03 LAB — BASIC METABOLIC PANEL
BUN: 24 mg/dL — AB (ref 6–23)
CO2: 22 meq/L (ref 19–32)
Calcium: 7.5 mg/dL — ABNORMAL LOW (ref 8.4–10.5)
Chloride: 105 mEq/L (ref 96–112)
Creatinine, Ser: 2.02 mg/dL — ABNORMAL HIGH (ref 0.50–1.35)
GFR calc Af Amer: 37 mL/min — ABNORMAL LOW (ref >90)
GFR, EST NON AFRICAN AMERICAN: 32 mL/min — AB (ref >90)
Glucose, Bld: 129 mg/dL — ABNORMAL HIGH (ref 70–99)
Potassium: 4.6 mEq/L (ref 3.7–5.3)
Sodium: 140 mEq/L (ref 137–147)

## 2013-11-03 LAB — HEMOGLOBIN A1C
HEMOGLOBIN A1C: 4.1 % (ref <5.7)
Mean Plasma Glucose: 71 mg/dL (ref <117)

## 2013-11-03 MED ORDER — FENTANYL CITRATE 0.05 MG/ML IJ SOLN
25.0000 ug | INTRAMUSCULAR | Status: DC | PRN
  Administered 2013-11-03 – 2013-11-04 (×2): 25 ug via INTRAVENOUS
  Filled 2013-11-03 (×2): qty 2

## 2013-11-03 MED ORDER — SODIUM CHLORIDE 0.9 % IV BOLUS (SEPSIS)
750.0000 mL | Freq: Once | INTRAVENOUS | Status: AC
  Administered 2013-11-03: 750 mL via INTRAVENOUS

## 2013-11-03 MED ORDER — SODIUM CHLORIDE 0.9 % IV BOLUS (SEPSIS)
1000.0000 mL | Freq: Once | INTRAVENOUS | Status: AC
  Administered 2013-11-03: 1000 mL via INTRAVENOUS

## 2013-11-03 MED ORDER — PHENYLEPHRINE HCL 10 MG/ML IJ SOLN
30.0000 ug/min | INTRAVENOUS | Status: DC
  Administered 2013-11-03: 60 ug/min via INTRAVENOUS
  Filled 2013-11-03: qty 2

## 2013-11-03 MED ORDER — METOPROLOL TARTRATE 1 MG/ML IV SOLN: 2.5000 mg | INTRAVENOUS | Status: DC | PRN

## 2013-11-03 MED ORDER — PHENYLEPHRINE HCL 10 MG/ML IJ SOLN
30.0000 ug/min | INTRAVENOUS | Status: DC
  Administered 2013-11-03: 85 ug/min via INTRAVENOUS
  Administered 2013-11-03: 70 ug/min via INTRAVENOUS
  Administered 2013-11-04: 35 ug/min via INTRAVENOUS
  Administered 2013-11-07: 30 ug/min via INTRAVENOUS
  Administered 2013-11-07 – 2013-11-08 (×6): 300 ug/min via INTRAVENOUS
  Filled 2013-11-03 (×14): qty 4

## 2013-11-03 MED ORDER — HEPARIN SODIUM (PORCINE) 1000 UNIT/ML IJ SOLN
3.7000 mL | Freq: Once | INTRAMUSCULAR | Status: AC
  Administered 2013-11-03: 3700 [IU] via INTRAVENOUS
  Filled 2013-11-03: qty 3.7

## 2013-11-03 NOTE — Progress Notes (Signed)
INITIAL NUTRITION ASSESSMENT  DOCUMENTATION CODES Per approved criteria  -Not Applicable   INTERVENTION:  Recommend nutrition support initiation (EN vs TPN) in next 24-48 hours RD to follow for nutrition care plan  NUTRITION DIAGNOSIS: Inadequate oral intake related to inability to eat as evidenced by NPO status  Goal: Pt to meet >/= 90% of their estimated nutrition needs   Monitor:  Nutrition support initiation, respiratory status, weight, labs, I/O's  Reason for Assessment: VDRF, Low Braden  73 y.o. male  Admitting Dx: abdominal wall dehiscence with evisceration  ASSESSMENT: 73 year old male w/ ESRD. [facility]ides at [facility]. He is s/p exploratory laparotomy right hemicolectomy and ileostomy for obstructing colon cancer with perforation of the right colon on 2/12. When he returned to his nursing home on 3/18 from dialysis, he was found to have eviscerated small bowel through his midline abdominal wound. He was brought emergently to the operating room for exploration where he underwent: lysis of adhesions, closure of abd wall, and placement of wound vac. He returned to the ICU on the vent.  Patient s/p procedures 3/18: EXPLORATORY LAPAROTOMY LYSIS OF ADHESIONS CLOSURE OF ABDOMINAL WALL PLACEMENT OF ABDOMINAL WALL VAC REVISION OF ILEOSTOMY  Patient is currently intubated on ventilator support -- NGT to LIS MV: 6.7 L/min Temp (24hrs), Avg:98.7 F (37.1 C), Min:97.8 F (36.6 C), Max:99.7 F (37.6 C)   Low braden score places patient at risk for skin breakdown.  If prolonged intubation expected, recommend nutrition support initiation within next 24-48 hours.  Height: Ht Readings from Last 1 Encounters:  11/07/2013 5\' 6"  (1.676 m)    Weight: Wt Readings from Last 1 Encounters:  11/05/2013 163 lb (73.936 kg)    Ideal Body Weight: 142 lb  % Ideal Body Weight: 115%  Wt Readings from Last 10 Encounters:  11/14/2013 163 lb (73.936 kg)  11/03/2013 163 lb (73.936 kg)  11/14/2013 163  lb (73.936 kg)  10/12/13 146 lb 9.7 oz (66.5 kg)  10/12/13 146 lb 9.7 oz (66.5 kg)  10/12/13 146 lb 9.7 oz (66.5 kg)  10/12/13 146 lb 9.7 oz (66.5 kg)  09/19/13 155 lb 10.3 oz (70.6 kg)  08/08/13 168 lb 3.4 oz (76.3 kg)  07/21/13 166 lb 3 oz (75.382 kg)    Usual Body Weight: 146 lb  % Usual Body Weight: 111%  BMI:  Body mass index is 26.32 kg/(m^2).  Estimated Nutritional Needs: Kcal: 1650-1800 Protein: 120-130 gm Fluid: per MD  Skin: abdominal wound VAC  Diet Order: NPO  EDUCATION NEEDS: -No education needs identified at this time   Intake/Output Summary (Last 24 hours) at 11/03/13 1008 Last data filed at 11/03/13 0900  Gross per 24 hour  Intake 5806.48 ml  Output      0 ml  Net 5806.48 ml    Labs:   Recent Labs Lab 11/06/2013 1805 11/15/2013 1846 11/03/13 0330  NA 136* 136* 140  K 4.8 4.7 4.6  CL 99 100 105  CO2 26  --  22  BUN 23 28* 24*  CREATININE 1.91* 2.30* 2.02*  CALCIUM 7.9*  --  7.5*  GLUCOSE 118* 114* 129*    CBG (last 3)   Recent Labs  11/03/13 0047 11/03/13 0341 11/03/13 0722  GLUCAP 127* 100* 113*    Scheduled Meds: . antiseptic oral rinse  15 mL Mouth Rinse QID  . cefOXitin  2 g Intravenous Q12H  . chlorhexidine  15 mL Mouth Rinse BID  . insulin aspart  0-9 Units Subcutaneous 6 times per day  .  pantoprazole (PROTONIX) IV  40 mg Intravenous Q24H    Continuous Infusions: . sodium chloride 50 mL/hr at 11/03/13 0800  . fentaNYL infusion INTRAVENOUS Stopped (11/03/2013 2240)  . phenylephrine (NEO-SYNEPHRINE) Adult infusion 95 mcg/min (11/03/13 0930)    Past Medical History  Diagnosis Date  . Coronary artery disease     a. hx of MI 1992; b. 10/2012 NSTEMI (no cath 2/2 GIB);  c. 11/2012 MV: Inf defect w inf and septal HK (favor prior MI), EF 47%;  d. 12/2012 Cath: Severe 3vd, EF 45-50%;  e. 01/2013 CABG x 5 (LIMA->LAD, VG->D1, VG->OM1->OM2, VG->PDA)  . Diabetes mellitus   . Hypertension   . Arthritis   . Carotid artery disease      a. 01/2013 doppler: >80% RICA stenosis, <35% LICA stenosis;  b. 10/2990 R CEA.  . Gastritis 2012 and 09/2011    treated for H Pylori in 12/2011  . Diabetic neuropathy   . Osteoporosis   . COPD (chronic obstructive pulmonary disease)   . Anxiety   . Depression   . ESRD (end stage renal disease)     M-W-Sa  [name]  . Diabetic nephropathy   . GERD (gastroesophageal reflux disease)   . Neuromuscular disorder     peripheral neurotherapy  . Anemia   . A-fib     a. Dx 10/2012, on amio, no anticoagulation 2/2 GIB 10/2012.  [name] GIB (gastrointestinal bleeding)     a. 10/2012, awaiting colonoscopy.    Past Surgical History  Procedure Laterality Date  . Coronary stent placement    . Hip surgery      both hips  . Av fistula placement      Left arm  . Cardiac catheterization    . Joint replacement Bilateral   . Eye surgery Bilateral     cararacts  . Coronary artery bypass graft N/A 02/03/2013    Procedure: CORONARY ARTERY BYPASS GRAFTING (CABG);  Surgeon: Melrose Nakayama, MD;  Location: [place];  Service: Open Heart Surgery;  Laterality: N/A;  . Endarterectomy N/A 02/03/2013    Procedure: ENDARTERECTOMY CAROTID;  Surgeon: Mal Misty, MD;  Location: [place];  Service: Vascular;  Laterality: N/A;  . Olecranon bursectomy Right 07/28/2013    Procedure: RIGHT ELBOW OLECRANON BURSECTOMY ADVANCEMENT CLOSURE ;  Surgeon: Linna Hoff, MD;  Location: [place];  Service: Orthopedics;  Laterality: Right;  . Colonoscopy N/A 09/29/2013    Procedure: COLONOSCOPY;  Surgeon: Cleotis Nipper, MD;  Location[facility] ENDOSCOPY;  Service: Endoscopy;  Laterality: N/A;  CO2  ultra-slim scope if available  . Partial colectomy Right 09/29/2013    Procedure: PARTIAL COLECTOMY;  Surgeon: Zenovia Jarred, MD;  Location: [place];  Service: General;  Laterality: Right;  . Colostomy N/A 09/29/2013    Procedure: COLOSTOMY;  Surgeon: Zenovia Jarred, MD;  Location: [place];  Service: General;  Laterality: N/A;    Arthur Holms, RD, LDN Pager #: [phone] After-Hours Pager #: [phone]

## 2013-11-03 NOTE — Progress Notes (Signed)
1 Day Post-Op  Subjective: On vent opens eyes  Objective: Vital signs in last 24 hours: Temp:  [97.8 F (36.6 C)-99.7 F (37.6 C)] 98.5 F (36.9 C) (03/19 0724) Pulse Rate:  [60-137] 96 (03/19 0730) Resp:  [7-20] 8 (03/19 0730) BP: (58-147)/(28-127) 81/53 mmHg (03/19 0730) SpO2:  [98 %-100 %] 100 % (03/19 0730) FiO2 (%):  [40 %-100 %] 40 % (03/19 0407) Weight:  [163 lb (73.936 kg)] 163 lb (73.936 kg) (03/18 1827) Last BM Date: 11/09/2013 (small amount of stool in ostomy bag)  Intake/Output from previous day: 03/18 0701 - 03/19 0700 In: 5656.5 [I.V.:1376.5; NG/GT:30; IV Piggyback:4250] Out: -  Intake/Output this shift:    Incision/Wound:ostomy pink and functioning.  Wound vac in place.  Flat Lungs clear CV  RRR  Lab Results:   Recent Labs  11/03/13 0001 11/03/13 0330  WBC 16.4* 20.3*  HGB 12.7* 12.7*  HCT 40.8 41.7  PLT 315 297   BMET  Recent Labs  10/25/2013 1805 11/05/2013 1846 11/03/13 0330  NA 136* 136* 140  K 4.8 4.7 4.6  CL 99 100 105  CO2 26  --  22  GLUCOSE 118* 114* 129*  BUN 23 28* 24*  CREATININE 1.91* 2.30* 2.02*  CALCIUM 7.9*  --  7.5*   PT/INR  Recent Labs  10/25/2013 1805  LABPROT 15.5*  INR 1.26   ABG  Recent Labs  10/17/2013 2139 11/03/13 0410  PHART 7.480* 7.353  HCO3 25.4* 19.8*    Studies/Results: Portable Chest Xray  10/30/2013   CLINICAL DATA:  Check ETT position.  EXAM: PORTABLE CHEST - 1 VIEW  COMPARISON:  10/01/2013  FINDINGS: Endotracheal tube is 3 cm above the carina. Right dialysis catheter remains in place, unchanged. Prior CABG. Layering bilateral pleural effusions, left greater than right. Right base atelectasis. Left basilar atelectasis or infiltrate.  IMPRESSION: Bilateral pleural effusions and bibasilar opacities, left greater than right.  Endotracheal tube 3 cm above the carina.   Electronically Signed   By: Rolm Baptise M.D.   On: 10/17/2013 21:57    Anti-infectives: Anti-infectives   Start     Dose/Rate Route  Frequency Ordered Stop   11/03/13 0700  cefOXitin (MEFOXIN) 2 g in dextrose 5 % 50 mL IVPB     2 g 100 mL/hr over 30 Minutes Intravenous Every 12 hours 11/09/2013 2043     10/18/2013 1845  cefOXitin (MEFOXIN) 1 g in dextrose 5 % 50 mL IVPB  Status:  Discontinued     1 g 100 mL/hr over 30 Minutes Intravenous 4 times per day 11/05/2013 1835 10/31/2013 2043      Assessment/Plan: s/p Procedure(s): EXPLORATORY LAPAROTOMY with Closure of Abdominal Wall (N/A) APPLICATION OF WOUND VAC (N/A) Ileostomy REVISION (Right) Hopefully extubate per CCM Let nephrology know he is here.  Got HD yesterday.   LOS: 1 day    Marilea Gwynne A. 11/03/2013

## 2013-11-03 NOTE — Consult Note (Signed)
[facility] Renal Consultation Note  Indication for Consultation:  Management of ESRD/hemodialysis; anemia, hypertension/volume and secondary hyperparathyroidism  HPI: Nicholas [name] is a 73 y.o. 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 He attended his OP hemodialysis treatment yesterday without reported problems taken back to his nursing home and was noted to have  dehiscence of his surgical site. Noted prior wound vac removed at [facility]cs ov with wet dressing applied and sent to NH. Now post op intubated Opens eyes and moves extremities to request.    Past Medical History  Diagnosis Date  . Coronary artery disease     a. hx of MI 1992; b. 10/2012 NSTEMI (no cath 2/2 GIB);  c. 11/2012 MV: Inf defect w inf and septal HK (favor prior MI), EF 47%;  d. 12/2012 Cath: Severe 3vd, EF 45-50%;  e. 01/2013 CABG x 5 (LIMA->LAD, VG->D1, VG->OM1->OM2, VG->PDA)  . Diabetes mellitus   . Hypertension   . Arthritis   . Carotid artery disease     a. 01/2013 doppler: >80% RICA stenosis, <93% LICA stenosis;  b. 03/1828 R CEA.  . Gastritis 2012 and 09/2011    treated for H Pylori in 12/2011  . Diabetic neuropathy   . Osteoporosis   . COPD (chronic obstructive pulmonary disease)   . Anxiety   . Depression   . ESRD (end stage renal disease)     M-W-Sa  [name]  . Diabetic nephropathy   . GERD (gastroesophageal reflux disease)   . Neuromuscular disorder     peripheral neurotherapy  . Anemia   . A-fib     a. Dx 10/2012, on amio, no anticoagulation 2/2 GIB 10/2012.  [name] GIB (gastrointestinal bleeding)     a. 10/2012, awaiting colonoscopy.    Past Surgical History  Procedure Laterality Date  . Coronary stent placement    . Hip surgery      both hips  . Av fistula placement      Left arm  . Cardiac  catheterization    . Joint replacement Bilateral   . Eye surgery Bilateral     cararacts  . Coronary artery bypass graft N/A 02/03/2013    Procedure: CORONARY ARTERY BYPASS GRAFTING (CABG);  Surgeon: Melrose Nakayama, MD;  Location: [facility];  Service: Open Heart Surgery;  Laterality: N/A;  . Endarterectomy N/A 02/03/2013    Procedure: ENDARTERECTOMY CAROTID;  Surgeon: Mal Misty, MD;  Location: [place];  Service: Vascular;  Laterality: N/A;  . Olecranon bursectomy Right 07/28/2013    Procedure: RIGHT ELBOW OLECRANON BURSECTOMY ADVANCEMENT CLOSURE ;  Surgeon: Linna Hoff, MD;  Location: [place];  Service: Orthopedics;  Laterality: Right;  . Colonoscopy N/A 09/29/2013    Procedure: COLONOSCOPY;  Surgeon: Cleotis Nipper, MD;  Location: [facility] ENDOSCOPY;  Service: Endoscopy;  Laterality: N/A;  CO2  ultra-slim scope if available  . Partial colectomy Right 09/29/2013    Procedure: PARTIAL COLECTOMY;  Surgeon: Zenovia Jarred, MD;  Location: [place];  Service: General;  Laterality: Right;  . Colostomy N/A 09/29/2013    Procedure: COLOSTOMY;  Surgeon: Zenovia Jarred, MD;  Location: [facility] OR;  Service: General;  Laterality: N/A;      Family History  Problem Relation Age of Onset  . Heart attack Father 21  . Heart disease Father   . Stroke Mother   .  Hypertension Mother   . Other Mother     varicose veins  . Stroke Maternal Grandmother   . Hypertension Brother    social= Lives in [place] since surgery     reports that he quit smoking about 23 years ago. His smoking use included Cigarettes. He started smoking about 48 years ago. He has a 25 pack-year smoking history. He has never used smokeless tobacco. He reports that he does not drink alcohol or use illicit drugs.   Allergies  Allergen Reactions  . Statins Other (See Comments)    Muscle weakness     Prior to Admission medications   Medication Sig Start Date End Date Taking? Authorizing Provider  acetaminophen (TYLENOL) 500 MG tablet Take 500  mg by mouth every 4 (four) hours as needed (pain).     Historical Provider, MD  allopurinol (ZYLOPRIM) 100 MG tablet Take 1 tablet (100 mg total) by mouth daily. 10/11/13   Orson Eva, MD  Amino Acids-Protein Hydrolys (FEEDING SUPPLEMENT, PRO-STAT SUGAR FREE 64,) LIQD Take 30 mLs by mouth 2 (two) times daily with a meal.    Historical Provider, MD  amiodarone (PACERONE) 200 MG tablet Take 200 mg by mouth daily.    Historical Provider, MD  ascorbic acid (VITAMIN C) 500 MG tablet Take 500 mg by mouth daily. X 60 days 10/16/13 12/15/13  Historical Provider, MD  aspirin EC 81 MG tablet Take 1 tablet (81 mg total) by mouth daily. 10/11/13   Orson Eva, MD  bisacodyl (DULCOLAX) 10 MG suppository Place 1 suppository (10 mg total) rectally daily as needed for moderate constipation (gas pain). 09/20/13   Consuella Lose, MD  calcitonin, salmon, (MIACALCIN/FORTICAL) 200 UNIT/ACT nasal spray Place 1 spray into alternate nostrils daily as needed.    Historical Provider, MD  darbepoetin (ARANESP) 60 MCG/0.3ML SOLN injection Inject 0.3 mLs (60 mcg total) into the vein every Monday with hemodialysis. 09/20/13   Consuella Lose, MD  doxercalciferol (HECTOROL) 4 MCG/2ML injection Inject 1 mL (2 mcg total) into the vein every Monday, Wednesday, and Friday with hemodialysis. 09/20/13   Consuella Lose, MD  feeding supplement, RESOURCE BREEZE, (RESOURCE BREEZE) LIQD Take 1 Container by mouth daily. 09/20/13   Consuella Lose, MD  fish oil-omega-3 fatty acids 1000 MG capsule Take 1 g by mouth 2 (two) times daily.     Historical Provider, MD  gabapentin (NEURONTIN) 300 MG capsule Take 1 capsule (300 mg total) by mouth 2 (two) times daily. 02/15/13   Donielle Liston Alba, PA-C  HYDROcodone-acetaminophen (NORCO/VICODIN) 5-325 MG per tablet Take 1 tablet by mouth 2 (two) times daily.    Historical Provider, MD  insulin aspart (NOVOLOG) 100 UNIT/ML injection Inject 0-9 Units into the skin 3 (three) times daily with meals. 09/20/13    Consuella Lose, MD  ipratropium (ATROVENT) 0.06 % nasal spray Place 2 sprays into both nostrils 3 (three) times daily.     Historical Provider, MD  IPRATROPIUM BROMIDE IN Inhale 0.6 % into the lungs 3 (three) times daily.    Historical Provider, MD  lidocaine (LIDODERM) 5 % Place 1 patch onto the skin daily. Remove & Discard patch within 12 hours or as directed by MD    Historical Provider, MD  magnesium hydroxide (MILK OF MAGNESIA) 400 MG/5ML suspension Take by mouth every 12 (twelve) hours as needed for mild constipation.    Historical Provider, MD  methocarbamol (ROBAXIN) 750 MG tablet Take 1 tablet (750 mg total) by mouth every 6 (six) hours as needed for muscle  spasms. 10/11/13   Orson Eva, MD  metoCLOPramide (REGLAN) 5 MG tablet Take 5 mg by mouth 3 (three) times daily.    Historical Provider, MD  midodrine (PROAMATINE) 5 MG tablet Take 5 mg by mouth 3 (three) times a week. Take once daily in the morning on dialysis days (usually on Monday, Wednesday and Friday)    Historical Provider, MD  multivitamin (RENA-VIT) TABS tablet Take 1 tablet by mouth at bedtime. 09/20/13   Consuella Lose, MD  Nutritional Supplements (FEEDING SUPPLEMENT, NEPRO CARB STEADY,) LIQD Take 237 mLs by mouth 2 (two) times daily. 09/20/13   Consuella Lose, MD  omeprazole (PRILOSEC) 20 MG capsule Take 20 mg by mouth 2 (two) times daily.    Historical Provider, MD  oxyCODONE (OXY IR/ROXICODONE) 5 MG immediate release tablet Take 1-2 tablets (5-10 mg total) by mouth every 4 (four) hours as needed for moderate pain or severe pain. 10/11/13   Orson Eva, MD  OxyCODONE (OXYCONTIN) 20 mg T12A 12 hr tablet Take 1 tablet (20 mg total) by mouth every 12 (twelve) hours. 10/11/13   Orson Eva, MD  pantoprazole (PROTONIX) 40 MG tablet Take 40 mg by mouth daily.    Historical Provider, MD  polyethylene glycol (MIRALAX / GLYCOLAX) packet Take 17 g by mouth daily.    Historical Provider, MD  promethazine (PHENERGAN) 25 MG tablet Take 25 mg  by mouth every 12 (twelve) hours as needed for nausea or vomiting.    Historical Provider, MD  saccharomyces boulardii (FLORASTOR) 250 MG capsule Take 1 capsule (250 mg total) by mouth 2 (two) times daily. 09/20/13   Consuella Lose, MD  senna (SENOKOT) 8.6 MG TABS tablet Take 1 tablet by mouth 2 (two) times daily.    Historical Provider, MD  sevelamer carbonate (RENVELA) 800 MG tablet Take 800 mg by mouth 3 (three) times daily with meals.    Historical Provider, MD  simethicone (MYLICON) 80 MG chewable tablet Chew 2 tablets (160 mg total) by mouth 4 (four) times daily as needed for flatulence. 09/20/13   Consuella Lose, MD  sodium chloride 0.9 % SOLN 100 mL with ferric gluconate 12.5 MG/ML SOLN 62.5 mg Inject 62.5 mg into the vein every Monday, Wednesday, and Friday with hemodialysis. 10/11/13   Orson Eva, MD  sorbitol 70 % solution Take 15 mLs by mouth daily as needed.    Historical Provider, MD  traMADol (ULTRAM) 50 MG tablet Take 1 tablet (50 mg total) by mouth every 12 (twelve) hours. 10/11/13   Orson Eva, MD  Witch Hazel (PREPARATION H EX) Apply 1 application topically every 6 (six) hours as needed (hemorrhoids).    Historical Provider, MD  zinc sulfate 220 MG capsule Take 220 mg by mouth daily. X 60 days from start date 10/16/13 12/15/13  Historical Provider, MD     Anti-infectives   Start     Dose/Rate Route Frequency Ordered Stop   11/03/13 0700  cefOXitin (MEFOXIN) 2 g in dextrose 5 % 50 mL IVPB     2 g 100 mL/hr over 30 Minutes Intravenous Every 12 hours 11/03/2013 2043     11/14/2013 1845  cefOXitin (MEFOXIN) 1 g in dextrose 5 % 50 mL IVPB  Status:  Discontinued     1 g 100 mL/hr over 30 Minutes Intravenous 4 times per day 11/14/2013 1835 11/12/2013 2043      Results for orders placed during the hospital encounter of 10/24/2013 (from the past 48 hour(s))  CBC WITH DIFFERENTIAL  Status: Abnormal   Collection Time    11/11/2013  6:05 PM      Result Value Ref Range   WBC    4.0 - 10.5 K/uL    Value: QUESTIONABLE RESULTS, RECOMMEND RECOLLECT TO VERIFY   Comment: OK PER DR.TSUEI 1925 10/28/2013 WBOND     CORRECTED ON 03/18 AT 1924: PREVIOUSLY REPORTED AS 11.8   RBC 3.35 (*) 4.22 - 5.81 MIL/uL   Comment: QUESTIONABLE RESULTS, RECOMMEND RECOLLECT TO VERIFY   Hemoglobin 11.2 (*) 13.0 - 17.0 g/dL   Comment: QUESTIONABLE RESULTS, RECOMMEND RECOLLECT TO VERIFY   HCT 36.6 (*) 39.0 - 52.0 %   Comment: QUESTIONABLE RESULTS, RECOMMEND RECOLLECT TO VERIFY   MCV 109.3 (*) 78.0 - 100.0 fL   Comment: QUESTIONABLE RESULTS, RECOMMEND RECOLLECT TO VERIFY   MCH 33.4  26.0 - 34.0 pg   Comment: QUESTIONABLE RESULTS, RECOMMEND RECOLLECT TO VERIFY   MCHC 30.6  30.0 - 36.0 g/dL   Comment: QUESTIONABLE RESULTS, RECOMMEND RECOLLECT TO VERIFY   RDW 20.8 (*) 11.5 - 15.5 %   Comment: QUESTIONABLE RESULTS, RECOMMEND RECOLLECT TO VERIFY   Platelets    150 - 400 K/uL   Value: QUESTIONABLE RESULTS, RECOMMEND RECOLLECT TO VERIFY   LUCs, %    0 - 4 %   Value: QUESTIONABLE RESULTS, RECOMMEND RECOLLECT TO VERIFY   LUC, Absolute    0.0 - 0.5 K/uL   Value: QUESTIONABLE RESULTS, RECOMMEND RECOLLECT TO VERIFY   Other       Value: QUESTIONABLE RESULTS, RECOMMEND RECOLLECT TO VERIFY   Other 2       Value: QUESTIONABLE RESULTS, RECOMMEND RECOLLECT TO VERIFY   Neutrophils Relative % 83 (*) 43 - 77 %   Comment: QUESTIONABLE RESULTS, RECOMMEND RECOLLECT TO VERIFY   Neutro Abs 9.8 (*) 1.7 - 7.7 K/uL   Comment: QUESTIONABLE RESULTS, RECOMMEND RECOLLECT TO VERIFY   Lymphocytes Relative 12  12 - 46 %   Comment: QUESTIONABLE RESULTS, RECOMMEND RECOLLECT TO VERIFY   Lymphs Abs 1.4  0.7 - 4.0 K/uL   Comment: QUESTIONABLE RESULTS, RECOMMEND RECOLLECT TO VERIFY   Monocytes Relative 5  3 - 12 %   Comment: QUESTIONABLE RESULTS, RECOMMEND RECOLLECT TO VERIFY   Monocytes Absolute 0.6  0.1 - 1.0 K/uL   Comment: QUESTIONABLE RESULTS, RECOMMEND RECOLLECT TO VERIFY   Eosinophils Relative 1  0 - 5 %   Comment: QUESTIONABLE RESULTS,  RECOMMEND RECOLLECT TO VERIFY   Eosinophils Absolute 0.1  0.0 - 0.7 K/uL   Comment: QUESTIONABLE RESULTS, RECOMMEND RECOLLECT TO VERIFY   Basophils Relative 0  0 - 1 %   Comment: QUESTIONABLE RESULTS, RECOMMEND RECOLLECT TO VERIFY   Basophils Absolute 0.0  0.0 - 0.1 K/uL   Comment: QUESTIONABLE RESULTS, RECOMMEND RECOLLECT TO VERIFY  COMPREHENSIVE METABOLIC PANEL     Status: Abnormal   Collection Time    11/09/2013  6:05 PM      Result Value Ref Range   Sodium 136 (*) 137 - 147 mEq/L   Potassium 4.8  3.7 - 5.3 mEq/L   Chloride 99  96 - 112 mEq/L   CO2 26  19 - 32 mEq/L   Glucose, Bld 118 (*) 70 - 99 mg/dL   BUN 23  6 - 23 mg/dL   Creatinine, Ser 1.91 (*) 0.50 - 1.35 mg/dL   Calcium 7.9 (*) 8.4 - 10.5 mg/dL   Total Protein 4.7 (*) 6.0 - 8.3 g/dL   Albumin 1.0 (*) 3.5 - 5.2 g/dL   AST  18  0 - 37 U/L   ALT 10  0 - 53 U/L   Alkaline Phosphatase 275 (*) 39 - 117 U/L   Total Bilirubin 0.3  0.3 - 1.2 mg/dL   GFR calc non Af Amer 34 (*) >90 mL/min   GFR calc Af Amer 39 (*) >90 mL/min   Comment: (NOTE)     The eGFR has been calculated using the CKD EPI equation.     This calculation has not been validated in all clinical situations.     eGFR's persistently <90 mL/min signify possible Chronic Kidney     Disease.  PROTIME-INR     Status: Abnormal   Collection Time    10/25/2013  6:05 PM      Result Value Ref Range   Prothrombin Time 15.5 (*) 11.6 - 15.2 seconds   INR 1.26  0.00 - 1.49  I-STAT CHEM 8, ED     Status: Abnormal   Collection Time    10/25/2013  6:46 PM      Result Value Ref Range   Sodium 136 (*) 137 - 147 mEq/L   Potassium 4.7  3.7 - 5.3 mEq/L   Chloride 100  96 - 112 mEq/L   BUN 28 (*) 6 - 23 mg/dL   Creatinine, Ser 2.30 (*) 0.50 - 1.35 mg/dL   Glucose, Bld 114 (*) 70 - 99 mg/dL   Calcium, Ion 1.16  1.13 - 1.30 mmol/L   TCO2 30  0 - 100 mmol/L   Hemoglobin 13.3  13.0 - 17.0 g/dL   HCT 39.0  39.0 - 52.0 %  MRSA PCR SCREENING     Status: None   Collection Time     11/11/2013  8:55 PM      Result Value Ref Range   MRSA by PCR NEGATIVE  NEGATIVE   Comment:            The GeneXpert MRSA Assay (FDA     approved for NASAL specimens     only), is one component of a     comprehensive MRSA colonization     surveillance program. It is not     intended to diagnose MRSA     infection nor to guide or     monitor treatment for     MRSA infections.  GLUCOSE, CAPILLARY     Status: None   Collection Time    10/25/2013  8:58 PM      Result Value Ref Range   Glucose-Capillary 96  70 - 99 mg/dL   Comment 1 Documented in Chart     Comment 2 Notify RN    POCT I-STAT 3, BLOOD GAS (G3+)     Status: Abnormal   Collection Time    10/26/2013  9:39 PM      Result Value Ref Range   pH, Arterial 7.480 (*) 7.350 - 7.450   pCO2 arterial 34.1 (*) 35.0 - 45.0 mmHg   pO2, Arterial 267.0 (*) 80.0 - 100.0 mmHg   Bicarbonate 25.4 (*) 20.0 - 24.0 mEq/L   TCO2 26  0 - 100 mmol/L   O2 Saturation 100.0     Acid-Base Excess 2.0  0.0 - 2.0 mmol/L   Patient temperature 98.2 F     Collection site BRACHIAL ARTERY     Drawn by RT     Sample type ARTERIAL    GLUCOSE, CAPILLARY     Status: Abnormal   Collection Time    10/31/2013 11:37 PM  Result Value Ref Range   Glucose-Capillary 68 (*) 70 - 99 mg/dL   Comment 1 Documented in Chart     Comment 2 Notify RN    CBC     Status: Abnormal   Collection Time    11/03/13 12:01 AM      Result Value Ref Range   WBC 16.4 (*) 4.0 - 10.5 K/uL   RBC 3.71 (*) 4.22 - 5.81 MIL/uL   Hemoglobin 12.7 (*) 13.0 - 17.0 g/dL   HCT 40.8  39.0 - 52.0 %   MCV 110.0 (*) 78.0 - 100.0 fL   MCH 34.2 (*) 26.0 - 34.0 pg   MCHC 31.1  30.0 - 36.0 g/dL   RDW NOT CALCULATED  11.5 - 15.5 %   Platelets 315  150 - 400 K/uL  GLUCOSE, CAPILLARY     Status: Abnormal   Collection Time    11/03/13 12:47 AM      Result Value Ref Range   Glucose-Capillary 127 (*) 70 - 99 mg/dL  CBC     Status: Abnormal   Collection Time    11/03/13  3:30 AM      Result Value  Ref Range   WBC 20.3 (*) 4.0 - 10.5 K/uL   RBC 3.75 (*) 4.22 - 5.81 MIL/uL   Hemoglobin 12.7 (*) 13.0 - 17.0 g/dL   HCT 41.7  39.0 - 52.0 %   MCV 111.2 (*) 78.0 - 100.0 fL   MCH 33.9  26.0 - 34.0 pg   MCHC 30.5  30.0 - 36.0 g/dL   RDW 20.7 (*) 11.5 - 15.5 %   Platelets 297  150 - 400 K/uL  BASIC METABOLIC PANEL     Status: Abnormal   Collection Time    11/03/13  3:30 AM      Result Value Ref Range   Sodium 140  137 - 147 mEq/L   Potassium 4.6  3.7 - 5.3 mEq/L   Chloride 105  96 - 112 mEq/L   CO2 22  19 - 32 mEq/L   Glucose, Bld 129 (*) 70 - 99 mg/dL   BUN 24 (*) 6 - 23 mg/dL   Creatinine, Ser 2.02 (*) 0.50 - 1.35 mg/dL   Calcium 7.5 (*) 8.4 - 10.5 mg/dL   GFR calc non Af Amer 32 (*) >90 mL/min   GFR calc Af Amer 37 (*) >90 mL/min   Comment: (NOTE)     The eGFR has been calculated using the CKD EPI equation.     This calculation has not been validated in all clinical situations.     eGFR's persistently <90 mL/min signify possible Chronic Kidney     Disease.  GLUCOSE, CAPILLARY     Status: Abnormal   Collection Time    11/03/13  3:41 AM      Result Value Ref Range   Glucose-Capillary 100 (*) 70 - 99 mg/dL   Comment 1 Documented in Chart     Comment 2 Notify RN    POCT I-STAT 3, BLOOD GAS (G3+)     Status: Abnormal   Collection Time    11/03/13  4:10 AM      Result Value Ref Range   pH, Arterial 7.353  7.350 - 7.450   pCO2 arterial 35.9  35.0 - 45.0 mmHg   pO2, Arterial 107.0 (*) 80.0 - 100.0 mmHg   Bicarbonate 19.8 (*) 20.0 - 24.0 mEq/L   TCO2 21  0 - 100 mmol/L   O2 Saturation 98.0  Acid-base deficit 5.0 (*) 0.0 - 2.0 mmol/L   Patient temperature 99.7 F     Collection site BRACHIAL ARTERY     Drawn by RT     Sample type ARTERIAL    GLUCOSE, CAPILLARY     Status: Abnormal   Collection Time    11/03/13  7:22 AM      Result Value Ref Range   Glucose-Capillary 113 (*) 70 - 99 mg/dL    ROS:  No history by Pt. Intubated see hpi /but no reported problems yesterday  at op hd   Physical Exam: Filed Vitals:   11/03/13 1100  BP:   Pulse: 88  Temp:   Resp: 9     General: elderly WM intubated opens eyes  To voice/ bp= 86/56   o2 sat 100% intubated HEENT: [place] / intubated/ Eyes: nonicteric Neck: no jvd Heart: RRR, no mur or rub Lungs: CTA intubated Abdomen: wound vac in place/colostomy bag brown stool scant amount Extremities: trace pedal edema Skin:  No overt rash Neuro: opens eyes to voice/ moves extremities to request Dialysis Access:  R IJ perm cath/ L FA AVF no bruit/ or thrill  mild swelling of arm   Dialysis Orders: Center: [facility]  on MWF . EDW 63kg HD Bath 2k, 2.25ca  Time  4 hrs Heparin 1,000. Access  R ij perm cqth/ L FA AVF BFR 400 DFR 800    hectrol 0 mcg IV/HD Epogen 7800   Units IV/HD  Venofer  17m q hd last dose 11/07/13  Other tfs 18%/ pth 54 /  Assessment/Plan 1. Abdominal  Wound Dehiscence with Evisceration  Sp Emergent Repair/ exploratory Laparotomy/ CCS RX/ CCM  2. ESRD -  HD MWF ([facility]) K= 4.6/  Fu am labs for need for HD/ no heparin hd/ With clotted AVF as op plans were for surgical revision after abdominal wounds were stable  Using perm cath  For hd 3. Hypertension/volume  - low bp on pressors/ fu labs in am for need for hd/ CCM managing  4. Anemia  - 12.7 hgb /  Hold esa for hgb > 12  Fu am labs/on venofer load  q hd  Through 10/2313 for iron deficiency  5. Metabolic bone disease -  No vit d with pth <100   DErnest Haber PA-C [facility] Beeper [phone]/19/2015, 11:17 AM   Renal Attending: Post op wound dehiscence repair in ESRD s/p right hemicolectomy for colon cancer.  He has HD MWF, last treated yesterday.  He is post op with hypotension and VDRF.  He has small pleural effusions by CXR and good oxygenation on high flow oxygen. Will plan observation today for stability; however, if no significant improvement would intervene with CRRT instead of IHD due to low BP. Darden Flemister C

## 2013-11-03 NOTE — Progress Notes (Signed)
Called to bedside by RN d/t pt w/ low rate, low min. Volume alarm.  Pt recently rec'd pain meds, changed pt to full support for now.

## 2013-11-03 NOTE — Consult Note (Signed)
PULMONARY / CRITICAL CARE MEDICINE   Name: Nicholas [name] MRN: [id number] DOB: 1940-11-06    ADMISSION DATE:  10/20/2013 CONSULTATION DATE:  10/18/2013  REFERRING MD :  Juanita Laster, MD PRIMARY SERVICE: CCS  CHIEF COMPLAINT:  Mechanical support  BRIEF PATIENT DESCRIPTION:  73 year old male w/ ESRD. Resides at [facility]. 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 When he returned to his nursing home on 3/18 from dialysis, he was found to have eviscerated small bowel through his midline abdominal wound. He was brought emergently to the operating room for exploration where he underwent: lysis of adhesions, closure of abd wall, and placement of wound vac. He returned to the ICU on the vent. PCCM was asked to assist w/ care.    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 He is dialysis dependent. When he returned to his nursing home on 3/18 from dialysis, he was found to have eviscerated small bowel through his midline abdominal wound. He was brought emergently to the operating room for exploration where he underwent: lysis of adhesions, closure of abd wall, and placement of wound vac. He returned to the ICU on the vent. PCCM was asked to assist w/ care.   PAST MEDICAL HISTORY :  Past Medical History  Diagnosis Date  . Coronary artery disease     a. hx of MI 1992; b. 10/2012 NSTEMI (no cath 2/2 GIB);  c. 11/2012 MV: Inf defect w inf  and septal HK (favor prior MI), EF 47%;  d. 12/2012 Cath: Severe 3vd, EF 45-50%;  e. 01/2013 CABG x 5 (LIMA->LAD, VG->D1, VG->OM1->OM2, VG->PDA)  . Diabetes mellitus   . Hypertension   . Arthritis   . Carotid artery disease     a. 01/2013 doppler: >80% RICA stenosis, [id number] LICA stenosis;  b. [id number] R CEA.  . Gastritis 2012 and 09/2011    treated for H Pylori in 12/2011  . Diabetic neuropathy   . Osteoporosis   . COPD (chronic obstructive pulmonary disease)   . Anxiety   . Depression   . ESRD (end stage renal disease)     M-W-Sa  [name]  . Diabetic nephropathy   . GERD (gastroesophageal reflux disease)   . Neuromuscular disorder     peripheral neurotherapy  . Anemia   . A-fib     a. Dx 10/2012, on amio, no anticoagulation 2/2 GIB 10/2012.  [name] GIB (gastrointestinal bleeding)     a. 10/2012, awaiting colonoscopy.   Past Surgical History  Procedure Laterality Date  . Coronary stent placement    . Hip surgery      both hips  . Av fistula placement      Left arm  . Cardiac catheterization    . Joint replacement Bilateral   . Eye surgery Bilateral     cararacts  . Coronary artery bypass graft N/A 02/03/2013    Procedure: CORONARY ARTERY BYPASS GRAFTING (CABG);  Surgeon: Melrose Nakayama, MD;  Location: [place];  Service: Open Heart Surgery;  Laterality: N/A;  . Endarterectomy N/A 02/03/2013    Procedure: ENDARTERECTOMY CAROTID;  Surgeon: Pryor Ochoa, MD;  Location: [facility] OR;  Service: Vascular;  Laterality: N/A;  . Olecranon bursectomy Right 07/28/2013    Procedure: RIGHT ELBOW OLECRANON BURSECTOMY ADVANCEMENT CLOSURE ;  Surgeon: Sharma Covert, MD;  Location: [facility] OR;  Service: Orthopedics;  Laterality: Right;  . Colonoscopy N/A 09/29/2013    Procedure: COLONOSCOPY;  Surgeon: Florencia Reasons, MD;  Location: [facility] ENDOSCOPY;  Service: Endoscopy;  Laterality: N/A;  CO2  ultra-slim scope if available  . Partial colectomy Right 09/29/2013    Procedure: PARTIAL COLECTOMY;  Surgeon: Liz Malady, MD;  Location: [facility] OR;  Service: General;  Laterality: Right;  . Colostomy N/A 09/29/2013    Procedure: COLOSTOMY;  Surgeon: Liz Malady, MD;  Location: [facility] OR;  Service: General;  Laterality: N/A;   Prior to Admission medications   Medication Sig Start Date End Date Taking? Authorizing Provider  acetaminophen (TYLENOL) 500 MG tablet Take 500 mg by mouth every 4 (four) hours as needed (pain).     Historical Provider, MD  allopurinol (ZYLOPRIM) 100 MG tablet Take 1 tablet (100 mg total) by mouth daily. 10/11/13   Catarina Hartshorn, MD  Amino Acids-Protein Hydrolys (FEEDING SUPPLEMENT, PRO-STAT SUGAR FREE 64,) LIQD Take 30 mLs by mouth 2 (two) times daily with a meal.    Historical Provider, MD  amiodarone (PACERONE) 200 MG tablet Take 200 mg by mouth daily.    Historical Provider, MD  ascorbic acid (VITAMIN C) 500 MG tablet Take 500 mg by mouth daily. X 60 days 10/16/13 12/15/13  Historical Provider, MD  aspirin EC 81 MG tablet Take 1 tablet (81 mg total) by mouth daily. 10/11/13   Catarina Hartshorn, MD  bisacodyl (DULCOLAX) 10 MG suppository Place 1 suppository (10 mg total) rectally daily as needed for moderate constipation (gas pain). 09/20/13   Lisbeth Renshaw, MD  calcitonin, salmon, (MIACALCIN/FORTICAL) 200 UNIT/ACT nasal spray Place 1 spray into alternate nostrils daily as needed.    Historical Provider, MD  darbepoetin (ARANESP) 60 MCG/0.3ML SOLN injection Inject 0.3 mLs (60 mcg total) into the vein every Monday with hemodialysis. 09/20/13   Lisbeth Renshaw, MD  doxercalciferol (HECTOROL) 4 MCG/2ML injection Inject 1 mL (2 mcg total) into the vein every Monday, Wednesday, and Friday with hemodialysis. 09/20/13   Lisbeth Renshaw, MD  feeding supplement, RESOURCE BREEZE, (RESOURCE BREEZE) LIQD Take 1 Container by mouth daily. 09/20/13   Lisbeth Renshaw, MD  fish oil-omega-3 fatty acids 1000 MG capsule Take 1 g by mouth 2 (two) times daily.     Historical Provider, MD  gabapentin (NEURONTIN) 300 MG  capsule Take 1 capsule (300 mg total) by mouth 2 (two) times daily. 02/15/13   Donielle Margaretann Loveless, PA-C  HYDROcodone-acetaminophen (NORCO/VICODIN) 5-325 MG per tablet Take 1 tablet by mouth 2 (two) times daily.    Historical Provider, MD  insulin aspart (NOVOLOG) 100 UNIT/ML injection Inject 0-9 Units into the skin 3 (three) times daily with meals. 09/20/13   Lisbeth Renshaw, MD  ipratropium (ATROVENT) 0.06 % nasal spray Place 2 sprays into both nostrils 3 (three) times daily.     Historical Provider, MD  IPRATROPIUM BROMIDE IN Inhale 0.6 % into the lungs 3 (three) times daily.    Historical Provider, MD  lidocaine (LIDODERM) 5 % Place 1 patch onto the skin daily. Remove & Discard patch within 12 hours or as directed by MD  Historical Provider, MD  magnesium hydroxide (MILK OF MAGNESIA) 400 MG/5ML suspension Take by mouth every 12 (twelve) hours as needed for mild constipation.    Historical Provider, MD  methocarbamol (ROBAXIN) 750 MG tablet Take 1 tablet (750 mg total) by mouth every 6 (six) hours as needed for muscle spasms. 10/11/13   Orson Eva, MD  metoCLOPramide (REGLAN) 5 MG tablet Take 5 mg by mouth 3 (three) times daily.    Historical Provider, MD  midodrine (PROAMATINE) 5 MG tablet Take 5 mg by mouth 3 (three) times a week. Take once daily in the morning on dialysis days (usually on Monday, Wednesday and Friday)    Historical Provider, MD  multivitamin (RENA-VIT) TABS tablet Take 1 tablet by mouth at bedtime. 09/20/13   Consuella Lose, MD  Nutritional Supplements (FEEDING SUPPLEMENT, NEPRO CARB STEADY,) LIQD Take 237 mLs by mouth 2 (two) times daily. 09/20/13   Consuella Lose, MD  omeprazole (PRILOSEC) 20 MG capsule Take 20 mg by mouth 2 (two) times daily.    Historical Provider, MD  oxyCODONE (OXY IR/ROXICODONE) 5 MG immediate release tablet Take 1-2 tablets (5-10 mg total) by mouth every 4 (four) hours as needed for moderate pain or severe pain. 10/11/13   Orson Eva, MD  OxyCODONE  (OXYCONTIN) 20 mg T12A 12 hr tablet Take 1 tablet (20 mg total) by mouth every 12 (twelve) hours. 10/11/13   Orson Eva, MD  pantoprazole (PROTONIX) 40 MG tablet Take 40 mg by mouth daily.    Historical Provider, MD  polyethylene glycol (MIRALAX / GLYCOLAX) packet Take 17 g by mouth daily.    Historical Provider, MD  promethazine (PHENERGAN) 25 MG tablet Take 25 mg by mouth every 12 (twelve) hours as needed for nausea or vomiting.    Historical Provider, MD  saccharomyces boulardii (FLORASTOR) 250 MG capsule Take 1 capsule (250 mg total) by mouth 2 (two) times daily. 09/20/13   Consuella Lose, MD  senna (SENOKOT) 8.6 MG TABS tablet Take 1 tablet by mouth 2 (two) times daily.    Historical Provider, MD  sevelamer carbonate (RENVELA) 800 MG tablet Take 800 mg by mouth 3 (three) times daily with meals.    Historical Provider, MD  simethicone (MYLICON) 80 MG chewable tablet Chew 2 tablets (160 mg total) by mouth 4 (four) times daily as needed for flatulence. 09/20/13   Consuella Lose, MD  sodium chloride 0.9 % SOLN 100 mL with ferric gluconate 12.5 MG/ML SOLN 62.5 mg Inject 62.5 mg into the vein every Monday, Wednesday, and Friday with hemodialysis. 10/11/13   Orson Eva, MD  sorbitol 70 % solution Take 15 mLs by mouth daily as needed.    Historical Provider, MD  traMADol (ULTRAM) 50 MG tablet Take 1 tablet (50 mg total) by mouth every 12 (twelve) hours. 10/11/13   Orson Eva, MD  Witch Hazel (PREPARATION H EX) Apply 1 application topically every 6 (six) hours as needed (hemorrhoids).    Historical Provider, MD  zinc sulfate 220 MG capsule Take 220 mg by mouth daily. X 60 days from start date 10/16/13 12/15/13  Historical Provider, MD   Allergies  Allergen Reactions  . Statins Other (See Comments)    Muscle weakness     FAMILY HISTORY:  Family History  Problem Relation Age of Onset  . Heart attack Father 76  . Heart disease Father   . Stroke Mother   . Hypertension Mother   . Other Mother      varicose veins  . Stroke  Maternal Grandmother   . Hypertension Brother    SOCIAL HISTORY:  reports that he quit smoking about 23 years ago. His smoking use included Cigarettes. He started smoking about 48 years ago. He has a 25 pack-year smoking history. He has never used smokeless tobacco. He reports that he does not drink alcohol or use illicit drugs.  REVIEW OF SYSTEMS:  Unable   SUBJECTIVE:  Sedated on vent  VITAL SIGNS: Temp:  [97.8 F (36.6 C)-99.7 F (37.6 C)] 99.7 F (37.6 C) (03/18 2348) Pulse Rate:  [60-133] 116 (03/18 2200) Resp:  [12-20] 18 (03/18 2345) BP: (58-123)/(28-83) 82/59 mmHg (03/18 2345) SpO2:  [98 %-100 %] 99 % (03/18 2345) FiO2 (%):  [50 %-100 %] 50 % (03/18 2200) Weight:  [73.936 kg (163 lb)] 73.936 kg (163 lb) (03/18 1827) HEMODYNAMICS:   VENTILATOR SETTINGS: Vent Mode:  [-] PRVC FiO2 (%):  [50 %-100 %] 50 % Set Rate:  [14 bmp-15 bmp] 15 bmp Vt Set:  [510 mL] 510 mL PEEP:  [5 cmH20] 5 cmH20 Plateau Pressure:  [12 cmH20] 12 cmH20 INTAKE / OUTPUT: Intake/Output     03/18 0701 - 03/19 0700   I.V. (mL/kg) 600 (8.1)   IV Piggyback 1000   Total Intake(mL/kg) 1600 (21.6)   Net +1600         PHYSICAL EXAMINATION: General:  Chronically ill appearing white male, sedated on vent  Neuro: sedated, moved all ext  HEENT:  Orally intubated  Cardiovascular:  rrr Lungs:  Scattered rhonchi  Abdomen:  Vac dressing intact, hypoactive  Musculoskeletal:  Intact  Skin:  Intact   LABS:  CBC  Recent Labs Lab 11/04/2013 1805 11/01/2013 1846  WBC QUESTIONABLE RESULTS, RECOMMEND RECOLLECT TO VERIFY  --   HGB 11.2* 13.3  HCT 36.6* 39.0  PLT QUESTIONABLE RESULTS, RECOMMEND RECOLLECT TO VERIFY  --    Coag's  Recent Labs Lab 10/24/2013 1805  INR 1.26   BMET  Recent Labs Lab 11/05/2013 1805 11/09/2013 1846  NA 136* 136*  K 4.8 4.7  CL 99 100  CO2 26  --   BUN 23 28*  CREATININE 1.91* 2.30*  GLUCOSE 118* 114*   Electrolytes  Recent Labs Lab  11/09/2013 1805  CALCIUM 7.9*   Sepsis Markers No results found for this basename: LATICACIDVEN, PROCALCITON, O2SATVEN,  in the last 168 hours ABG  Recent Labs Lab 11/09/2013 2139  PHART 7.480*  PCO2ART 34.1*  PO2ART 267.0*   Liver Enzymes  Recent Labs Lab 10/23/2013 1805  AST 18  ALT 10  ALKPHOS 275*  BILITOT 0.3  ALBUMIN 1.0*   Cardiac Enzymes No results found for this basename: TROPONINI, PROBNP,  in the last 168 hours Glucose  Recent Labs Lab 11/04/2013 2058 11/06/2013 2337  GLUCAP 96 68*    Imaging Portable Chest Xray  10/25/2013   CLINICAL DATA:  Check ETT position.  EXAM: PORTABLE CHEST - 1 VIEW  COMPARISON:  10/01/2013  FINDINGS: Endotracheal tube is 3 cm above the carina. Right dialysis catheter remains in place, unchanged. Prior CABG. Layering bilateral pleural effusions, left greater than right. Right base atelectasis. Left basilar atelectasis or infiltrate.  IMPRESSION: Bilateral pleural effusions and bibasilar opacities, left greater than right.  Endotracheal tube 3 cm above the carina.   Electronically Signed   By: Rolm Baptise M.D.   On: 10/20/2013 21:57     CXR: left >right effusion.   ASSESSMENT / PLAN:  PULMONARY A:  acute respiratory failure  Left > right effusion  P:  Full vent support Vent changes (to decrease Ve) F/u abg and CXR in am Noted pao2, likley reduce to minimal O2 needs on vent PAD protocol   CARDIOVASCULAR A:  shock. Suspect mostly hypovolemia does have h/o ICM w/ EF [id number] gd i diastolic dysfxn, and global hypokinesis. He is on chronic midodrine at home   AF w/ RVR  P:  Cycle CEs  cvp goal 8-12, last 5, bolus Neo for SBP > 100 Ck cortisol Hold antihypertensives  Consider ECHO if still hypotensive in am 3/19  RENAL A:  ESRD. 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 w/ perf back on 2/12 S/p expl lap for  abd wall dehiscence w/ evisceration w/ lysis of adhesion, closure of abd wall, and placement of wound VAC P:   Post-op care per surgery  PPI   HEMATOLOGIC A:  Chronic anemia  P:  SCDs F/u cbc   INFECTIOUS A:  No evidence of active infection  P:   Post-op cefoxitin   ENDOCRINE A:  DM  P:   CBGs  NEUROLOGIC A:  Post-op pain  P:   Supportive care  fent  TODAY'S SUMMARY: Reduce MV, reduce O2 needs, follow renal fxn in am   I have personally obtained a history, examined the patient, evaluated laboratory and imaging results, formulated the assessment and plan and placed orders. CRITICAL CARE: The patient is critically ill with multiple organ systems failure and requires high complexity decision making for assessment and support, frequent evaluation and titration of therapies, application of advanced monitoring technologies and extensive interpretation of multiple databases. Critical Care Time devoted to patient care services described in this note is 35 minutes.   Lavon Paganini. [name], MD, [place] Pgr: [facility]  Pulmonary and [place] Pager: [phone]  11/03/2013, 12:12 AM

## 2013-11-03 NOTE — Procedures (Signed)
Central Venous Catheter Insertion Procedure Note Nicholas [name] [id number] 03-31-1941  Procedure: Insertion of Central Venous Catheter Indications: Assessment of intravascular volume, Drug and/or fluid administration and Frequent blood sampling  Procedure Details Consent: Risks of procedure as well as the alternatives and risks of each were explained to the (patient/caregiver).  Consent for procedure obtained. Time Out: Verified patient identification, verified procedure, site/side was marked, verified correct patient position, special equipment/implants available, medications/allergies/relevent history reviewed, required imaging and test results available.  Performed  Maximum sterile technique was used including antiseptics, cap, gloves, gown, hand hygiene, mask and sheet. Skin prep: Chlorhexidine; local anesthetic administered A antimicrobial bonded/coated triple lumen catheter was placed in the left internal jugular vein using the Seldinger technique.  Evaluation Blood flow good Complications: No apparent complications Patient did tolerate procedure well. Chest X-ray ordered to verify placement.  CXR: pending.  Procedure performed under US guidance, guidewire visualized in vessel.  Montey Hora, PA - C  Pulmonary & Critical Care Pgr: [phone]  or [phone]  I was present for and supervised the entire procedure  Merton Border, MD ; [facility] service [place] [phone].  After 5:30 PM or weekends, call [phone]

## 2013-11-03 NOTE — ED Provider Notes (Signed)
Medical screening examination/treatment/procedure(s) were performed by non-physician practitioner and as supervising physician I was immediately available for consultation/collaboration.   EKG Interpretation None       Richarda Blade, MD 11/03/13 1511

## 2013-11-03 NOTE — Progress Notes (Signed)
Utilization review completed. Amara Manalang, RN, BSN. 

## 2013-11-03 NOTE — Progress Notes (Signed)
PULMONARY / CRITICAL CARE MEDICINE   Name: Nicholas [name] MRN: [id number] DOB: 24-Feb-1941    ADMISSION DATE:  11/15/2013 CONSULTATION DATE:  10/23/2013  REFERRING MD :  Juanita Laster, MD PRIMARY SERVICE: CCS  CHIEF COMPLAINT:  Mechanical support  BRIEF PATIENT DESCRIPTION:  73 year old male w/ ESRD. Resides at [facility]. 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 When he returned to his nursing home on 3/18 from dialysis, he was found to have eviscerated small bowel through his midline abdominal wound. He was brought emergently to the operating room for exploration where he underwent: lysis of adhesions, closure of abd wall, and placement of wound vac. He returned to the ICU on the vent. PCCM was asked to assist w/ care.    SIGNIFICANT EVENTS / STUDIES:  3/18: #1 exploratory laparotomy #2 lysis of adhesions #3 closure of abdominal wall #4 placement of abdominal wound VAC (20 cm) #5 revision of ileostomy   LINES / TUBES: ETT 3/18 >>  I IJ CVL 3/19 >>   CULTURES:   ANTIBIOTICS: Cefoxitin 3/18>>>   SUBJECTIVE:  RASS -2. Failed SBT due to bradypnea. Remains on vasopressors  VITAL SIGNS: Temp:  [97.8 F (36.6 C)-99.7 F (37.6 C)] 98.8 F (37.1 C) (03/19 1548) Pulse Rate:  [80-137] 94 (03/19 1630) Resp:  [0-30] 30 (03/19 1630) BP: (58-147)/(28-127) 87/59 mmHg (03/19 1630) SpO2:  [98 %-100 %] 100 % (03/19 1630) FiO2 (%):  [40 %-100 %] 40 % (03/19 1600) Weight:  [73.936 kg (163 lb)] 73.936 kg (163 lb) (03/18 1827) HEMODYNAMICS: CVP:  [1 mmHg-11 mmHg] 8 mmHg VENTILATOR SETTINGS: Vent Mode:  [-] PRVC FiO2 (%):  [40 %-100 %] 40 % Set Rate:  [12 bmp-15 bmp] 12 bmp Vt Set:  [480 mL-510 mL] 480 mL PEEP:  [5 cmH20] 5 cmH20 Pressure Support:  [12 cmH20] 12 cmH20 Plateau Pressure:  [11 cmH20-18 cmH20] 16 cmH20 INTAKE / OUTPUT: Intake/Output     03/18 0701 - 03/19 0700 03/19 0701 - 03/20 0700   I.V. (mL/kg) 1376.5 (18.6) 747.8 (10.1)   NG/GT 30 30   IV Piggyback 4250 50   Total Intake(mL/kg) 5656.5 (76.5) 827.8 (11.2)   Net +5656.5 +827.8        Stool Occurrence 1 x      PHYSICAL EXAMINATION: General:  Chronically ill appearing white male, sedated on vent  Neuro: sedated, moved all ext  HEENT:  Orally intubated  Cardiovascular:  rrr Lungs:  Scattered rhonchi  Abdomen:  Vac dressing intact, hypoactive  Musculoskeletal:  Intact  Skin:  Intact   LABS: I have reviewed all of today's lab results. Relevant abnormalities are discussed in the A/P section  CXR: NNF  ASSESSMENT / PLAN:  PULMONARY A:  acute respiratory failure  Left > right effusion  P:   Cont vent support Cotn vent bundle Daily SBT  CARDIOVASCULAR A:  Hypotension AF w/ RVR > NSR P:  Wean PE to off for MAP > 55 mmHg Cont amiodarone - might need to change to IV until able to take enterally  RENAL A:  ESRD. MWF P:   Renal consult requested 3/19  GASTROINTESTINAL A:   S/p right hemicolectomy w/ ileostomy for obstructing ileocecal cancer w/ perf back on 2/12 P lap for abd wall dehiscence P:   Post-op care per surgery  Nutrition per CCS SUP: IV PPI   HEMATOLOGIC A:  Chronic anemia  P:  SCDs F/u cbc  INFECTIOUS A:  No evidence of active infection  P:   Post-op cefoxitin   ENDOCRINE A:  DM  P:   CBGs  NEUROLOGIC A:  Post-op pain  P:   Supportive care  fent  TODAY'S SUMMARY:     I have personally obtained a history, examined the patient, evaluated laboratory and imaging results, formulated the assessment and plan and placed orders. CRITICAL CARE: The patient is critically ill with multiple organ systems failure and requires high complexity decision making for assessment and support, frequent evaluation and titration of therapies, application of advanced monitoring technologies and extensive interpretation of multiple databases. Critical Care Time devoted to patient care  services described in this note is 30 minutes.   Merton Border, MD ; [facility] [phone].  After 5:30 PM or weekends, call [phone]   11/03/2013, 4:55 PM

## 2013-11-04 ENCOUNTER — Inpatient Hospital Stay (HOSPITAL_COMMUNITY): Payer: Medicare Other

## 2013-11-04 ENCOUNTER — Encounter (HOSPITAL_COMMUNITY): Payer: Self-pay | Admitting: Surgery

## 2013-11-04 DIAGNOSIS — E1129 Type 2 diabetes mellitus with other diabetic kidney complication: Secondary | ICD-10-CM

## 2013-11-04 LAB — COMPREHENSIVE METABOLIC PANEL
ALBUMIN: 0.8 g/dL — AB (ref 3.5–5.2)
ALT: 7 U/L (ref 0–53)
AST: 11 U/L (ref 0–37)
Alkaline Phosphatase: 190 U/L — ABNORMAL HIGH (ref 39–117)
BILIRUBIN TOTAL: 0.3 mg/dL (ref 0.3–1.2)
BUN: 31 mg/dL — ABNORMAL HIGH (ref 6–23)
CHLORIDE: 106 meq/L (ref 96–112)
CO2: 22 mEq/L (ref 19–32)
Calcium: 7.4 mg/dL — ABNORMAL LOW (ref 8.4–10.5)
Creatinine, Ser: 2.46 mg/dL — ABNORMAL HIGH (ref 0.50–1.35)
GFR calc Af Amer: 29 mL/min — ABNORMAL LOW (ref >90)
GFR, EST NON AFRICAN AMERICAN: 25 mL/min — AB (ref >90)
Glucose, Bld: 101 mg/dL — ABNORMAL HIGH (ref 70–99)
POTASSIUM: 4.5 meq/L (ref 3.7–5.3)
Sodium: 137 mEq/L (ref 137–147)
Total Protein: 3.9 g/dL — ABNORMAL LOW (ref 6.0–8.3)

## 2013-11-04 LAB — GLUCOSE, CAPILLARY
GLUCOSE-CAPILLARY: 89 mg/dL (ref 70–99)
GLUCOSE-CAPILLARY: 85 mg/dL (ref 70–99)
Glucose-Capillary: 106 mg/dL — ABNORMAL HIGH (ref 70–99)
Glucose-Capillary: 78 mg/dL (ref 70–99)
Glucose-Capillary: 89 mg/dL (ref 70–99)
Glucose-Capillary: 97 mg/dL (ref 70–99)

## 2013-11-04 LAB — CBC
HCT: 31.4 % — ABNORMAL LOW (ref 39.0–52.0)
Hemoglobin: 9.7 g/dL — ABNORMAL LOW (ref 13.0–17.0)
MCH: 33.2 pg (ref 26.0–34.0)
MCHC: 30.9 g/dL (ref 30.0–36.0)
MCV: 107.5 fL — ABNORMAL HIGH (ref 78.0–100.0)
Platelets: 263 10*3/uL (ref 150–400)
RBC: 2.92 MIL/uL — AB (ref 4.22–5.81)
RDW: 20.4 % — ABNORMAL HIGH (ref 11.5–15.5)
WBC: 22.1 10*3/uL — ABNORMAL HIGH (ref 4.0–10.5)

## 2013-11-04 MED ORDER — FENTANYL CITRATE 0.05 MG/ML IJ SOLN
12.5000 ug | INTRAMUSCULAR | Status: DC | PRN
  Administered 2013-11-06 – 2013-11-08 (×7): 25 ug via INTRAVENOUS
  Filled 2013-11-04 (×6): qty 2

## 2013-11-04 NOTE — Progress Notes (Signed)
2 Days Post-Op  Subjective: Awake alert still on vent.  Once tube out.   Objective: Vital signs in last 24 hours: Temp:  [98.7 F (37.1 C)-98.8 F (37.1 C)] 98.7 F (37.1 C) (03/19 1950) Pulse Rate:  [80-106] 92 (03/20 0600) Resp:  [0-30] 14 (03/20 0600) BP: (77-117)/(48-92) 103/66 mmHg (03/20 0600) SpO2:  [99 %-100 %] 100 % (03/20 0600) FiO2 (%):  [40 %] 40 % (03/20 0330) Weight:  [154 lb 8.7 oz (70.1 kg)] 154 lb 8.7 oz (70.1 kg) (03/20 0226) Last BM Date: 11/03/13  Intake/Output from previous day: 03/19 0701 - 03/20 0700 In: 1781.8 [I.V.:1601.8; NG/GT:30; IV Piggyback:150] Out: 250 [Emesis/NG output:200; Drains:50] Intake/Output this shift:    Incision/Wound:vac in place.  Ostomy with scant contents. Flat soft Pt awake and gesturing for tube removal.   Lab Results:   Recent Labs  11/03/13 0330 11/04/13 0515  WBC 20.3* 22.1*  HGB 12.7* 9.7*  HCT 41.7 31.4*  PLT 297 263   BMET  Recent Labs  11/03/13 0330 11/04/13 0515  NA 140 137  K 4.6 4.5  CL 105 106  CO2 22 22  GLUCOSE 129* 101*  BUN 24* 31*  CREATININE 2.02* 2.46*  CALCIUM 7.5* 7.4*   PT/INR  Recent Labs  11/18/13 1805  LABPROT 15.5*  INR 1.26   ABG  Recent Labs  11/18/13 2139 11/03/13 0410  PHART 7.480* 7.353  HCO3 25.4* 19.8*    Studies/Results: Dg Chest Port 1 View  11/03/2013   CLINICAL DATA:  Line placement  EXAM: PORTABLE CHEST - 1 VIEW  COMPARISON:  DG CHEST 1V PORT dated 2013-11-18  FINDINGS: There is a left jugular central venous catheter with the tip projecting over the SVC. The remainder of the support lines and tubing are in unchanged position.  There are bilateral small pleural effusions. There is bibasilar airspace disease likely reflecting atelectasis. No pneumothorax. Stable cardiomediastinal silhouette. Prior CABG.  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  IMPRESSION: Left-sided jugular central venous catheter with the tip projecting  over the SVC without a pneumothorax.   Electronically Signed   By: Kathreen Devoid   On: 11/03/2013 15:04   Portable Chest Xray  11-18-2013   CLINICAL DATA:  Check ETT position.  EXAM: PORTABLE CHEST - 1 VIEW  COMPARISON:  10/01/2013  FINDINGS: Endotracheal tube is 3 cm above the carina. Right dialysis catheter remains in place, unchanged. Prior CABG. Layering bilateral pleural effusions, left greater than right. Right base atelectasis. Left basilar atelectasis or infiltrate.  IMPRESSION: Bilateral pleural effusions and bibasilar opacities, left greater than right.  Endotracheal tube 3 cm above the carina.   Electronically Signed   By: Rolm Baptise M.D.   On: 2013/11/18 21:57    Anti-infectives: Anti-infectives   Start     Dose/Rate Route Frequency Ordered Stop   11/03/13 0700  cefOXitin (MEFOXIN) 2 g in dextrose 5 % 50 mL IVPB     2 g 100 mL/hr over 30 Minutes Intravenous Every 12 hours 11-18-2013 2043     November 18, 2013 1845  cefOXitin (MEFOXIN) 1 g in dextrose 5 % 50 mL IVPB  Status:  Discontinued     1 g 100 mL/hr over 30 Minutes Intravenous 4 times per day 11-18-2013 1835 2013/11/18 2043      Assessment/Plan: s/p Procedure(s): EXPLORATORY LAPAROTOMY with Closure of Abdominal Wall (N/A) APPLICATION OF WOUND VAC (N/A) Ileostomy REVISION (Right) Vac change Saturday.   Can have clears once extubated. Vent per CCM  Nephrology following for HD purposes.   LOS: 2 days    Nicholas [name] A. 11/04/2013

## 2013-11-04 NOTE — Progress Notes (Signed)
PULMONARY / CRITICAL CARE MEDICINE   Name: Nicholas [name] MRN: [id number] DOB: [birth year]    ADMISSION DATE:  11/14/2013 CONSULTATION DATE:  11/11/2013  REFERRING MD :  Juanita Laster, MD PRIMARY SERVICE: CCS  CHIEF COMPLAINT:  Mechanical support  BRIEF PATIENT DESCRIPTION:  73 year old male w/ ESRD. Resides at [facility]. 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 When he returned to his nursing home on 3/18 from dialysis, he was found to have eviscerated small bowel through his midline abdominal wound. He was brought emergently to the operating room for exploration where he underwent: lysis of adhesions, closure of abd wall, and placement of wound vac. He returned to the ICU on the vent. PCCM was asked to assist w/ care.    SIGNIFICANT EVENTS / STUDIES:  3/18: #1 exploratory laparotomy #2 lysis of adhesions #3 closure of abdominal wall #4 placement of abdominal wound VAC (20 cm) #5 revision of ileostomy   LINES / TUBES: ETT 3/18 >> 3/20 I IJ CVL 3/19 >>   CULTURES:   ANTIBIOTICS: Cefoxitin 3/18>>>   SUBJECTIVE:  RASS 0. Passed SBT. Remains on very low dose vasopressors  VITAL SIGNS: Temp:  [97.8 F (36.6 C)-98.7 F (37.1 C)] 98.3 F (36.8 C) (03/20 1531) Pulse Rate:  [80-111] 91 (03/20 1500) Resp:  [0-30] 0 (03/20 1500) BP: (77-126)/(48-92) 95/54 mmHg (03/20 1500) SpO2:  [100 %] 100 % (03/20 1500) FiO2 (%):  [40 %] 40 % (03/20 1000) Weight:  [70.1 kg (154 lb 8.7 oz)] 70.1 kg (154 lb 8.7 oz) (03/20 0226) HEMODYNAMICS: CVP:  [1 mmHg-7 mmHg] 1 mmHg VENTILATOR SETTINGS: Vent Mode:  [-] PSV;CPAP FiO2 (%):  [40 %] 40 % Set Rate:  [12 bmp] 12 bmp Vt Set:  [480 mL] 480 mL PEEP:  [5 cmH20] 5 cmH20 Pressure Support:  [5 cmH20] 5 cmH20 Plateau Pressure:  [12 cmH20-17 cmH20] 17 cmH20 INTAKE / OUTPUT: Intake/Output     03/19 0701 - 03/20 0700 03/20 0701 - 03/21 0700   I.V. (mL/kg) 1651.8  (23.6) 273.6 (3.9)   NG/GT 30 30   IV Piggyback 150    Total Intake(mL/kg) 1831.8 (26.1) 303.6 (4.3)   Emesis/NG output 200    Drains 50    Total Output 250     Net +1581.8 +303.6          PHYSICAL EXAMINATION: General:  NAD Neuro: No focal deficits HEENT: Poor dentition, otherwise WNL Cardiovascular:  RRR s M Lungs: clear Abdomen:  Vac dressing intact, hypoactive  Ext: symmetric edema   LABS: I have reviewed all of today's lab results. Relevant abnormalities are discussed in the A/P section  CXR: increased edema pattern, L>R effusions  ASSESSMENT / PLAN:  PULMONARY A:  acute respiratory failure  Left > right effusion  P:   Extubate 3/20 Post extubation monitoring in ICU Supplemental O2  CARDIOVASCULAR A:  Hypotension AFRVR, resolved P:  Wean PE to off for MAP > 55 mmHg Resume enteral amiodarone when able  RENAL A:  ESRD. MWF P:   Mgmt per Renal Monitor BMET intermittently Monitor I/Os Correct electrolytes as indicated  GASTROINTESTINAL A:   S/p right hemicolectomy w/ ileostomy 2/12 P lap for abd wall dehiscence P:   Post-op care per surgery  Nutrition per CCS SUP: IV PPI   HEMATOLOGIC A:  Chronic anemia  P:  SCDs F/u cbc   INFECTIOUS A:  No evidence of active infection  P:  Post-op cefoxitin   ENDOCRINE A:  DM  P:   CBGs  NEUROLOGIC A:  Post-op pain  P:   Supportive care  fent  TODAY'S SUMMARY:     I have personally obtained a history, examined the patient, evaluated laboratory and imaging results, formulated the assessment and plan and placed orders. CRITICAL CARE: The patient is critically ill with multiple organ systems failure and requires high complexity decision making for assessment and support, frequent evaluation and titration of therapies, application of advanced monitoring technologies and extensive interpretation of multiple databases. Critical Care Time devoted to patient care services described in this note is 30  minutes.   Merton Border, MD ; [facility] [phone].  After 5:30 PM or weekends, call [phone]   11/04/2013, 4:23 PM

## 2013-11-04 NOTE — Progress Notes (Signed)
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 presenting with wound dehiscence and evisceration s/p repair 3/18 c/b post op VDRF  Subjective:  Tolerating SBT today, awake, alert follows commands Still on low dose PE gtt BPs soft CVP 4-6    03/19 0701 - 03/20 0700 In: 1781.8 [I.V.:1601.8; NG/GT:30; IV Piggyback:150] Out: 250 [Emesis/NG output:200; Drains:50]  Filed Weights   11/05/2013 1827 11/04/13 0226  Weight: 73.936 kg (163 lb) 70.1 kg (154 lb 8.7 oz)    Current meds: reviewed  Current Labs: reviewed   Outpt HD Orders Unit: [place] Days: MWF Time: 4h EDW: 63kg K/Ca: 2/2.25 Access: TDC (has clotted L FA AVF Needle Size: na BFR: 400 VDRA: none EPO: 7800 qTx IV Fe: 100 qHD, last dose 3/23 Heparin: 1000 qTx Last PTH: 54 Last TSAT: 18%   Physical Exam:  Blood pressure 106/63, pulse 89, temperature 98.3 F (36.8 C), temperature source Oral, resp. rate 15, height 5\' 6"  (1.676 m), weight 70.1 kg (154 lb 8.7 oz), SpO2 100.00%. Awake, alert, intubated NAD Coarse bs b/l No LEE Wound vac, surgical changes on abdomen  Assessment/Plan 1. ESRD MWF TDC at  [facility]: Give cont soft BPs, Req PE gtt, stable K and HCO3, tolertinat SBT currently will hold on HD.  If fails SBT or volume status worsens will need to perform for UF, possible CRRT 2. Vol/HTN: Take NS to 22mL/hr 3. Abd wound dehiscence: per CCS, not a lot of output in wound vac 4. Anemia: monitor, holding ESA 5. 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    Pearson Grippe MD 11/04/2013, 8:26 AM   Recent Labs Lab 2013-11-05 1805 2013-11-05 1846 11/03/13 0330 11/04/13 0515  NA 136* 136* 140 137  K 4.8 4.7 4.6 4.5  CL 99 100 105 106  CO2 26  --  22 22  GLUCOSE 118* 114* 129* 101*  BUN 23 28* 24* 31*  CREATININE 1.91* 2.30* 2.02* 2.46*  CALCIUM 7.9*  --  7.5* 7.4*    Recent Labs Lab 2013-11-05 1805  11/03/13 0001 11/03/13 0330 11/04/13 0515  WBC QUESTIONABLE RESULTS, RECOMMEND RECOLLECT TO VERIFY  --   16.4* 20.3* 22.1*  NEUTROABS 9.8*  --   --   --   --   HGB 11.2*  < > 12.7* 12.7* 9.7*  HCT 36.6*  < > 40.8 41.7 31.4*  MCV 109.3*  --  110.0* 111.2* 107.5*  PLT QUESTIONABLE RESULTS, RECOMMEND RECOLLECT TO VERIFY  --  315 297 263  < > = values in this interval not displayed.

## 2013-11-04 NOTE — Procedures (Signed)
Extubation Procedure Note  Patient Details:   Name: KYAN YURKOVICH DOB: 1941-06-27 MRN: [id number]   Airway Documentation:     Evaluation  O2 sats: stable throughout Complications: No apparent complications Patient did tolerate procedure well. Bilateral Breath Sounds: Clear Suctioning: Airway Yes pt able to vocalize.    Pt extubated at this time per MD order. Patient placed on 2L [place]. Pt able to breathe around deflated cuff. No complications. No stridor noted. VS WNL. Pt has adequate cough. IS performed [id number]. RT will continue to monitor.   Irineo Axon [facility] 11/04/2013, 11:49 AM

## 2013-11-04 NOTE — Progress Notes (Signed)
MD Shon Baton from Radiology notified RN that OGT possibly not in place. Per recommendation, OGT pulled out and reinserted.  Bilious residual noted.  Placement auscultated.  OGT to low intermittent suction.  Will continue to monitor.

## 2013-11-04 NOTE — Progress Notes (Signed)
Nursing to change wound vac tomorrow 3/21 per Dr.Cornett

## 2013-11-05 LAB — COMPREHENSIVE METABOLIC PANEL
ALT: 6 U/L (ref 0–53)
AST: 10 U/L (ref 0–37)
Albumin: 0.7 g/dL — ABNORMAL LOW (ref 3.5–5.2)
Alkaline Phosphatase: 178 U/L — ABNORMAL HIGH (ref 39–117)
BUN: 35 mg/dL — AB (ref 6–23)
CALCIUM: 7.7 mg/dL — AB (ref 8.4–10.5)
CO2: 21 mEq/L (ref 19–32)
CREATININE: 2.86 mg/dL — AB (ref 0.50–1.35)
Chloride: 104 mEq/L (ref 96–112)
GFR calc non Af Amer: 21 mL/min — ABNORMAL LOW (ref >90)
GFR, EST AFRICAN AMERICAN: 24 mL/min — AB (ref >90)
GLUCOSE: 99 mg/dL (ref 70–99)
POTASSIUM: 4.8 meq/L (ref 3.7–5.3)
Sodium: 137 mEq/L (ref 137–147)
TOTAL PROTEIN: 3.8 g/dL — AB (ref 6.0–8.3)
Total Bilirubin: 0.2 mg/dL — ABNORMAL LOW (ref 0.3–1.2)

## 2013-11-05 LAB — GLUCOSE, CAPILLARY
GLUCOSE-CAPILLARY: 79 mg/dL (ref 70–99)
GLUCOSE-CAPILLARY: 127 mg/dL — AB (ref 70–99)
GLUCOSE-CAPILLARY: 118 mg/dL — AB (ref 70–99)
Glucose-Capillary: 96 mg/dL (ref 70–99)

## 2013-11-05 LAB — CBC
HEMATOCRIT: 29 % — AB (ref 39.0–52.0)
HEMOGLOBIN: 9.2 g/dL — AB (ref 13.0–17.0)
MCH: 34.3 pg — AB (ref 26.0–34.0)
MCHC: 31.7 g/dL (ref 30.0–36.0)
MCV: 108.2 fL — AB (ref 78.0–100.0)
Platelets: 199 10*3/uL (ref 150–400)
RBC: 2.68 MIL/uL — ABNORMAL LOW (ref 4.22–5.81)
RDW: 20.6 % — ABNORMAL HIGH (ref 11.5–15.5)
WBC: 23 10*3/uL — ABNORMAL HIGH (ref 4.0–10.5)

## 2013-11-05 MED ORDER — PENTAFLUOROPROP-TETRAFLUOROETH EX AERO: 1.0000 "application " | INHALATION_SPRAY | CUTANEOUS | Status: DC | PRN

## 2013-11-05 MED ORDER — LIDOCAINE HCL (PF) 1 % IJ SOLN: 5.0000 mL | INTRAMUSCULAR | Status: DC | PRN

## 2013-11-05 MED ORDER — MIDODRINE HCL 2.5 MG PO TABS
2.5000 mg | ORAL_TABLET | Freq: Three times a day (TID) | ORAL | Status: DC
  Administered 2013-11-05 – 2013-11-06 (×4): 2.5 mg via ORAL
  Filled 2013-11-05 (×7): qty 1

## 2013-11-05 MED ORDER — ALTEPLASE 2 MG IJ SOLR
2.0000 mg | Freq: Once | INTRAMUSCULAR | Status: AC | PRN
  Filled 2013-11-05: qty 2

## 2013-11-05 MED ORDER — OXYCODONE-ACETAMINOPHEN 5-325 MG PO TABS
1.0000 | ORAL_TABLET | ORAL | Status: DC | PRN
  Administered 2013-11-05: 1 via ORAL
  Filled 2013-11-05 (×2): qty 1

## 2013-11-05 MED ORDER — AMIODARONE HCL 200 MG PO TABS
200.0000 mg | ORAL_TABLET | Freq: Every day | ORAL | Status: DC
  Administered 2013-11-05 – 2013-11-07 (×3): 200 mg via ORAL
  Filled 2013-11-05 (×4): qty 1

## 2013-11-05 MED ORDER — NEPRO/CARBSTEADY PO LIQD
237.0000 mL | ORAL | Status: DC | PRN
  Filled 2013-11-05: qty 237

## 2013-11-05 MED ORDER — PHENOL 1.4 % MT LIQD
1.0000 | OROMUCOSAL | Status: DC | PRN
  Filled 2013-11-05: qty 177

## 2013-11-05 MED ORDER — HEPARIN SODIUM (PORCINE) 5000 UNIT/ML IJ SOLN
5000.0000 [IU] | Freq: Three times a day (TID) | INTRAMUSCULAR | Status: DC
  Administered 2013-11-05 – 2013-11-08 (×9): 5000 [IU] via SUBCUTANEOUS
  Filled 2013-11-05 (×13): qty 1

## 2013-11-05 MED ORDER — SODIUM CHLORIDE 0.9 % IV SOLN: 100.0000 mL | INTRAVENOUS | Status: DC | PRN

## 2013-11-05 MED ORDER — HEPARIN SODIUM (PORCINE) 1000 UNIT/ML DIALYSIS
20.0000 [IU]/kg | INTRAMUSCULAR | Status: DC | PRN
  Administered 2013-11-05: 1600 [IU] via INTRAVENOUS_CENTRAL

## 2013-11-05 MED ORDER — LIDOCAINE-PRILOCAINE 2.5-2.5 % EX CREA
1.0000 "application " | TOPICAL_CREAM | CUTANEOUS | Status: DC | PRN
  Filled 2013-11-05: qty 5

## 2013-11-05 MED ORDER — HEPARIN SODIUM (PORCINE) 1000 UNIT/ML DIALYSIS
1000.0000 [IU] | INTRAMUSCULAR | Status: DC | PRN
  Filled 2013-11-05 (×3): qty 1

## 2013-11-05 NOTE — Progress Notes (Addendum)
PULMONARY / CRITICAL CARE MEDICINE   Name: Nicholas [name] MRN: [id number] DOB: [birth year]    ADMISSION DATE:  11/07/2013 CONSULTATION DATE:  10/23/2013  REFERRING MD :  Juanita Laster, MD PRIMARY SERVICE: CCS  CHIEF COMPLAINT:  Mechanical support  BRIEF PATIENT DESCRIPTION:  73 year old male w/ ESRD. Resides at [facility]. 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 When he returned to his nursing home on 3/18 from dialysis, he was found to have eviscerated small bowel through his midline abdominal wound. He was brought emergently to the operating room for exploration where he underwent: lysis of adhesions, closure of abd wall, and placement of wound vac. He returned to the ICU on the vent. PCCM was asked to assist w/ care.    SIGNIFICANT EVENTS / STUDIES:  3/18: #1 exploratory laparotomy #2 lysis of adhesions #3 closure of abdominal wall #4 placement of abdominal wound VAC (20 cm) #5 revision of ileostomy   LINES / TUBES: ETT 3/18 >> 3/20 I IJ CVL 3/19 >>   CULTURES:   ANTIBIOTICS: Cefoxitin 3/18>>>   SUBJECTIVE:  No distress. BP boarderline   VITAL SIGNS: Temp:  [97.4 F (36.3 C)-98.4 F (36.9 C)] 98.4 F (36.9 C) (03/21 0400) Pulse Rate:  [85-119] 98 (03/21 0603) Resp:  [5-20] 16 (03/21 0603) BP: (67-126)/(44-83) 84/44 mmHg (03/21 0603) SpO2:  [82 %-100 %] 94 % (03/21 0603) FiO2 (%):  [40 %] 40 % (03/20 1000) Weight:  [78.2 kg (172 lb 6.4 oz)] 78.2 kg (172 lb 6.4 oz) (03/21 0600) HEMODYNAMICS: CVP:  [1 mmHg-3 mmHg] 1 mmHg VENTILATOR SETTINGS: Vent Mode:  [-] PSV;CPAP FiO2 (%):  [40 %] 40 % PEEP:  [5 cmH20] 5 cmH20 Pressure Support:  [5 cmH20] 5 cmH20 INTAKE / OUTPUT: Intake/Output     03/20 0701 - 03/21 0700 03/21 0701 - 03/22 0700   P.O. 480    I.V. (mL/kg) 553.6 (7.1)    NG/GT 30    IV Piggyback 100    Total Intake(mL/kg) 1163.6 (14.9)    Emesis/NG output     Drains     Total Output       Net +1163.6           2 liters  PHYSICAL EXAMINATION: General:  NAD Neuro: No focal deficits HEENT: Poor dentition, otherwise WNL Cardiovascular:  RRR s M Lungs: clear, decreased, no accessory muscle use  Abdomen:  Vac dressing intact, hypoactive  Ext: symmetric edema   LABS:  Recent Labs Lab 11/03/13 0330 11/04/13 0515 11/05/13 0314  NA 140 137 137  K 4.6 4.5 4.8  CL 105 106 104  CO2 22 22 21   BUN 24* 31* 35*  CREATININE 2.02* 2.46* 2.86*  GLUCOSE 129* 101* 99    Recent Labs Lab 11/03/13 0330 11/04/13 0515 11/05/13 0314  HGB 12.7* 9.7* 9.2*  HCT 41.7 31.4* 29.0*  WBC 20.3* 22.1* 23.0*  PLT 297 263 199      CXR: increased edema pattern, L>R effusions  ASSESSMENT / PLAN:  PULMONARY A:  acute respiratory failure  Left > right effusion, volume overload  Extubated 3/20 P:   Volume removal as able via HD Mobilize as able (likely needs abd binder given h/o wound dehiscence) Wean O2   CARDIOVASCULAR A:  Hypotension--> to some degree chronic as was on Midodrine at home.  AFRVR, resolved P:  Maintain PE to off for MAP > 55 mmHg,  Start midodrine (was on  this only HD days, will start TID for now, should be able to taper w/ time) Resume enteral amiodarone   RENAL A:  ESRD. MWF P:   Mgmt per Renal Monitor BMET intermittently Monitor I/Os Correct electrolytes as indicated Min UOP  GASTROINTESTINAL A:   S/p right hemicolectomy w/ ileostomy 2/12 P lap for abd wall dehiscence P:   Post-op care per surgery  Nutrition per CCS, started clears 3/20 SUP: IV PPI   HEMATOLOGIC A:  Chronic anemia  P:  SCDs F/u cbc   INFECTIOUS A:   No fever but marked leukocytosis (low threshold to escalate rx) P:   Cont Post-op cefoxitin -->if fever spikes will pan culture and widen abx   ENDOCRINE A:  DM  P:   CBGs w/ ssi   NEUROLOGIC A:  Post-op pain  P:   Supportive care  fent  TODAY'S SUMMARY:   looks pretty good. Very frail.  BP low but this is chronic for him. Will start back on midodrine. For HD today.   Keep ICU today 11/05/13  I have personally obtained a history, examined the patient, evaluated laboratory and imaging results, formulated the assessment and plan and placed orders. CRITICAL CARE: The patient is critically ill with multiple organ systems failure and requires high complexity decision making for assessment and support, frequent evaluation and titration of therapies, application of advanced monitoring technologies and extensive interpretation of multiple databases. Critical Care Time devoted to patient care services described in this note is 30 minutes.   Mariel Sleet Beeper  [phone]  Cell  [phone]  If no response or cell goes to voicemail, call beeper [phone]   11/05/2013, 7:34 AM

## 2013-11-05 NOTE — Progress Notes (Addendum)
3 Days Post-Op  Subjective: Awake, taking clears, no real complaints  Objective: Vital signs in last 24 hours: Temp:  [97.4 F (36.3 C)-98.4 F (36.9 C)] 97.9 F (36.6 C) (03/21 0758) Pulse Rate:  [85-119] 98 (03/21 0603) Resp:  [5-20] 16 (03/21 0603) BP: (67-118)/(44-83) 84/44 mmHg (03/21 0603) SpO2:  [82 %-100 %] 94 % (03/21 0603) FiO2 (%):  [40 %] 40 % (03/20 1000) Weight:  [172 lb 6.4 oz (78.2 kg)] 172 lb 6.4 oz (78.2 kg) (03/21 0600) Last BM Date: 11/04/13  Intake/Output from previous day: 03/20 0701 - 03/21 0700 In: 1163.6 [P.O.:480; I.V.:553.6; NG/GT:30; IV Piggyback:100] Out: -  Intake/Output this shift:    General appearance: no distress Resp: diminished breath sounds bibasilar Cardio: regular rate and rhythm GI: vac in place, ileostomy with some air in bag, few bs  Lab Results:   Recent Labs  11/04/13 0515 11/05/13 0314  WBC 22.1* 23.0*  HGB 9.7* 9.2*  HCT 31.4* 29.0*  PLT 263 199   BMET  Recent Labs  11/04/13 0515 11/05/13 0314  NA 137 137  K 4.5 4.8  CL 106 104  CO2 22 21  GLUCOSE 101* 99  BUN 31* 35*  CREATININE 2.46* 2.86*  CALCIUM 7.4* 7.7*   PT/INR  Recent Labs  10/26/2013 1805  LABPROT 15.5*  INR 1.26   ABG  Recent Labs  11/05/2013 2139 11/03/13 0410  PHART 7.480* 7.353  HCO3 25.4* 19.8*    Studies/Results: Dg Chest Port 1 View  11/04/2013   CLINICAL DATA:  Exploratory laparotomy  EXAM: PORTABLE CHEST - 1 VIEW  COMPARISON:  DG CHEST 1V PORT dated 11/03/2013; DG CHEST 1V PORT dated 10/01/2013; DG CHEST 1V PORT dated 09/30/2013; DG CHEST 2 VIEW dated 09/09/2013; CT ABD/PELV WO CM dated 10/06/2013; DG CHEST 1V PORT dated 11/09/2013  FINDINGS: Grossly unchanged cardiac silhouette and mediastinal contours given patient rotation. Post median sternotomy and CABG. Stable position of support apparatus. The pulmonary vasculature appears less distinct on the present examination with cephalization of flow. Grossly unchanged small bilateral  effusions. Worsening bibasilar opacities. No pneumothorax. Unchanged bones including deformity of the surgical neck of the right humerus.  IMPRESSION: 1. Enteric tube overlies the left heart border, an interval change since prior remote examinations and thus is in an indeterminate location. Correlation for aspiration of gastric fluid is recommended. 2. Otherwise, stable positioning of support apparatus. No pneumothorax. 3. Worsening pulmonary edema bibasilar atelectasis. Critical Value/emergent results were called by telephone at the time of interpretation on 11/04/2013 at 8:04 AM to [facility], RN, who verbally acknowledged these results.   Electronically Signed   By: Sandi Mariscal M.D.   On: 11/04/2013 08:04   Dg Chest Port 1 View  11/03/2013   CLINICAL DATA:  Line placement  EXAM: PORTABLE CHEST - 1 VIEW  COMPARISON:  DG CHEST 1V PORT dated 11/09/2013  FINDINGS: There is a left jugular central venous catheter with the tip projecting over the SVC. The remainder of the support lines and tubing are in unchanged position.  There are bilateral small pleural effusions. There is bibasilar airspace disease likely reflecting atelectasis. No pneumothorax. Stable cardiomediastinal silhouette. Prior CABG.  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  IMPRESSION: Left-sided jugular central venous catheter with the tip projecting over the SVC without a pneumothorax.   Electronically Signed   By: Kathreen Devoid   On: 11/03/2013 15:04    Anti-infectives: Anti-infectives   Start     Dose/Rate Route Frequency  Ordered Stop   11/03/13 0700  cefOXitin (MEFOXIN) 2 g in dextrose 5 % 50 mL IVPB     2 g 100 mL/hr over 30 Minutes Intravenous Every 12 hours 2013/11/19 2043     11-19-13 1845  cefOXitin (MEFOXIN) 1 g in dextrose 5 % 50 mL IVPB  Status:  Discontinued     1 g 100 mL/hr over 30 Minutes Intravenous 4 times per day 19-Nov-2013 1835 2013-11-19 2043      Assessment/Plan: S/p elap for  evisceration  Vac change today Continue clears today, await ileus to resolve a little pulm toilet, oob today HD today, will leave in icu today maybe out tomorrow Will start sq heparin  [facility] 11/05/2013

## 2013-11-05 NOTE — Progress Notes (Signed)
Post op wound dehiscence repair in ESRD  s/p right hemicolectomy for colon cancer Hypotension(OP dialysis pre tx BP94/49 per tele report) on midodrine as OP ABLA Volume excess Plan IHD today with pressor support PRN  Subjective: Interval History: Had some urinary retention requiring  Catheterization with> 400cc in bladder.  Back on midodrine  Objective: Vital signs in last 24 hours: Temp:  [97.4 F (36.3 C)-98.4 F (36.9 C)] 97.9 F (36.6 C) (03/21 0758) Pulse Rate:  [85-119] 98 (03/21 0603) Resp:  [5-20] 16 (03/21 0603) BP: (67-118)/(44-83) 84/44 mmHg (03/21 0603) SpO2:  [82 %-100 %] 94 % (03/21 0603) Weight:  [78.2 kg (172 lb 6.4 oz)] 78.2 kg (172 lb 6.4 oz) (03/21 0600) Weight change: 8.1 kg (17 lb 13.7 oz)  Intake/Output from previous day: 03/20 0701 - 03/21 0700 In: 1163.6 [P.O.:480; I.V.:553.6; NG/GT:30; IV Piggyback:100] Out: -  Intake/Output this shift:    General appearance: alert and cooperative Resp: clear to auscultation bilaterally Cardio: regular rate and rhythm, S1, S2 normal, no murmur, click, rub or gallop GI: post op not examined Extremities: edema tr to 1+   Lab Results:  Recent Labs  11/04/13 0515 11/05/13 0314  WBC 22.1* 23.0*  HGB 9.7* 9.2*  HCT 31.4* 29.0*  PLT 263 199   BMET:  Recent Labs  11/04/13 0515 11/05/13 0314  NA 137 137  K 4.5 4.8  CL 106 104  CO2 22 21  GLUCOSE 101* 99  BUN 31* 35*  CREATININE 2.46* 2.86*  CALCIUM 7.4* 7.7*   No results found for this basename: PTH,  in the last 72 hours Iron Studies: No results found for this basename: IRON, TIBC, TRANSFERRIN, FERRITIN,  in the last 72 hours Studies/Results: Dg Chest Port 1 View  11/04/2013   CLINICAL DATA:  Exploratory laparotomy  EXAM: PORTABLE CHEST - 1 VIEW  COMPARISON:  DG CHEST 1V PORT dated 11/03/2013; DG CHEST 1V PORT dated 10/01/2013; DG CHEST 1V PORT dated 09/30/2013; DG CHEST 2 VIEW dated 09/09/2013; CT ABD/PELV WO CM dated 10/06/2013; DG CHEST 1V PORT dated  11/11/2013  FINDINGS: Grossly unchanged cardiac silhouette and mediastinal contours given patient rotation. Post median sternotomy and CABG. Stable position of support apparatus. The pulmonary vasculature appears less distinct on the present examination with cephalization of flow. Grossly unchanged small bilateral effusions. Worsening bibasilar opacities. No pneumothorax. Unchanged bones including deformity of the surgical neck of the right humerus.  IMPRESSION: 1. Enteric tube overlies the left heart border, an interval change since prior remote examinations and thus is in an indeterminate location. Correlation for aspiration of gastric fluid is recommended. 2. Otherwise, stable positioning of support apparatus. No pneumothorax. 3. Worsening pulmonary edema bibasilar atelectasis. Critical Value/emergent results were called by telephone at the time of interpretation on 11/04/2013 at 8:04 AM to [facility], RN, who verbally acknowledged these results.   Electronically Signed   By: Sandi Mariscal M.D.   On: 11/04/2013 08:04   Dg Chest Port 1 View  11/03/2013   CLINICAL DATA:  Line placement  EXAM: PORTABLE CHEST - 1 VIEW  COMPARISON:  DG CHEST 1V PORT dated 11/12/2013  FINDINGS: There is a left jugular central venous catheter with the tip projecting over the SVC. The remainder of the support lines and tubing are in unchanged position.  There are bilateral small pleural effusions. There is bibasilar airspace disease likely reflecting atelectasis. No pneumothorax. Stable cardiomediastinal silhouette. Prior CABG.  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  IMPRESSION: Left-sided jugular  central venous catheter with the tip projecting over the SVC without a pneumothorax.   Electronically Signed   By: Kathreen Devoid   On: 11/03/2013 15:04   Scheduled: . amiodarone  200 mg Oral Daily  . antiseptic oral rinse  15 mL Mouth Rinse QID  . cefOXitin  2 g Intravenous Q12H  . chlorhexidine  15 mL  Mouth Rinse BID  . heparin subcutaneous  5,000 Units Subcutaneous 3 times per day  . insulin aspart  0-9 Units Subcutaneous 6 times per day  . midodrine  2.5 mg Oral TID WC  . pantoprazole (PROTONIX) IV  40 mg Intravenous Q24H     LOS: 3 days   Jamille Yoshino C 11/05/2013,10:52 AM

## 2013-11-06 ENCOUNTER — Inpatient Hospital Stay (HOSPITAL_COMMUNITY): Payer: Medicare Other

## 2013-11-06 DIAGNOSIS — K659 Peritonitis, unspecified: Secondary | ICD-10-CM

## 2013-11-06 DIAGNOSIS — E43 Unspecified severe protein-calorie malnutrition: Secondary | ICD-10-CM

## 2013-11-06 DIAGNOSIS — Z66 Do not resuscitate: Secondary | ICD-10-CM

## 2013-11-06 LAB — GLUCOSE, CAPILLARY
GLUCOSE-CAPILLARY: 102 mg/dL — AB (ref 70–99)
GLUCOSE-CAPILLARY: 116 mg/dL — AB (ref 70–99)
GLUCOSE-CAPILLARY: 142 mg/dL — AB (ref 70–99)
GLUCOSE-CAPILLARY: 90 mg/dL (ref 70–99)
GLUCOSE-CAPILLARY: 99 mg/dL (ref 70–99)
Glucose-Capillary: 118 mg/dL — ABNORMAL HIGH (ref 70–99)
Glucose-Capillary: 99 mg/dL (ref 70–99)

## 2013-11-06 LAB — COMPREHENSIVE METABOLIC PANEL
ALT: 7 U/L (ref 0–53)
AST: 16 U/L (ref 0–37)
Albumin: 1.4 g/dL — ABNORMAL LOW (ref 3.5–5.2)
Alkaline Phosphatase: 199 U/L — ABNORMAL HIGH (ref 39–117)
BUN: 16 mg/dL (ref 6–23)
CALCIUM: 7.6 mg/dL — AB (ref 8.4–10.5)
CO2: 24 meq/L (ref 19–32)
CREATININE: 1.74 mg/dL — AB (ref 0.50–1.35)
Chloride: 103 mEq/L (ref 96–112)
GFR calc Af Amer: 44 mL/min — ABNORMAL LOW (ref >90)
GFR, EST NON AFRICAN AMERICAN: 38 mL/min — AB (ref >90)
Glucose, Bld: 120 mg/dL — ABNORMAL HIGH (ref 70–99)
Potassium: 3.6 mEq/L — ABNORMAL LOW (ref 3.7–5.3)
Sodium: 140 mEq/L (ref 137–147)
Total Bilirubin: 0.4 mg/dL (ref 0.3–1.2)
Total Protein: 4.4 g/dL — ABNORMAL LOW (ref 6.0–8.3)

## 2013-11-06 LAB — CBC
HCT: 29.6 % — ABNORMAL LOW (ref 39.0–52.0)
HEMOGLOBIN: 9.4 g/dL — AB (ref 13.0–17.0)
MCH: 33.8 pg (ref 26.0–34.0)
MCHC: 31.8 g/dL (ref 30.0–36.0)
MCV: 106.5 fL — ABNORMAL HIGH (ref 78.0–100.0)
Platelets: 242 10*3/uL (ref 150–400)
RBC: 2.78 MIL/uL — AB (ref 4.22–5.81)
RDW: 20.2 % — ABNORMAL HIGH (ref 11.5–15.5)
WBC: 34.3 10*3/uL — ABNORMAL HIGH (ref 4.0–10.5)

## 2013-11-06 LAB — CORTISOL: Cortisol, Plasma: 37.4 ug/dL

## 2013-11-06 MED ORDER — PRISMASOL BGK 4/2.5 32-4-2.5 MEQ/L IV SOLN
INTRAVENOUS | Status: DC
  Administered 2013-11-06 – 2013-11-08 (×9): via INTRAVENOUS_CENTRAL
  Filled 2013-11-06 (×13): qty 5000

## 2013-11-06 MED ORDER — VANCOMYCIN HCL 10 G IV SOLR
1500.0000 mg | Freq: Once | INTRAVENOUS | Status: AC
  Administered 2013-11-06: 1500 mg via INTRAVENOUS
  Filled 2013-11-06: qty 1500

## 2013-11-06 MED ORDER — NOREPINEPHRINE BITARTRATE 1 MG/ML IJ SOLN
2.0000 ug/min | INTRAMUSCULAR | Status: DC
  Administered 2013-11-06: 30 ug/min via INTRAVENOUS
  Administered 2013-11-07: 23 ug/min via INTRAVENOUS
  Filled 2013-11-06 (×3): qty 16

## 2013-11-06 MED ORDER — ALBUMIN HUMAN 25 % IV SOLN
12.5000 g | Freq: Once | INTRAVENOUS | Status: AC
  Administered 2013-11-06: 12.5 g via INTRAVENOUS
  Filled 2013-11-06: qty 50

## 2013-11-06 MED ORDER — VANCOMYCIN HCL 10 G IV SOLR
1500.0000 mg | Freq: Once | INTRAVENOUS | Status: DC
  Filled 2013-11-06: qty 1500

## 2013-11-06 MED ORDER — PIPERACILLIN-TAZOBACTAM IN DEX 2-0.25 GM/50ML IV SOLN
2.2500 g | Freq: Three times a day (TID) | INTRAVENOUS | Status: DC
  Administered 2013-11-06 – 2013-11-07 (×3): 2.25 g via INTRAVENOUS
  Filled 2013-11-06 (×5): qty 50

## 2013-11-06 MED ORDER — HEPARIN SODIUM (PORCINE) 1000 UNIT/ML DIALYSIS
1000.0000 [IU] | INTRAMUSCULAR | Status: DC | PRN
  Administered 2013-11-07: 3700 [IU] via INTRAVENOUS_CENTRAL
  Filled 2013-11-06: qty 6
  Filled 2013-11-06: qty 1
  Filled 2013-11-06: qty 3

## 2013-11-06 MED ORDER — PRISMASOL BGK 4/2.5 32-4-2.5 MEQ/L IV SOLN
INTRAVENOUS | Status: DC
  Administered 2013-11-06 – 2013-11-07 (×2): via INTRAVENOUS_CENTRAL
  Filled 2013-11-06 (×3): qty 5000

## 2013-11-06 MED ORDER — DEXTROSE 5 % IV SOLN
2.0000 ug/min | INTRAVENOUS | Status: DC
  Administered 2013-11-06: 5 ug/min via INTRAVENOUS
  Filled 2013-11-06: qty 4

## 2013-11-06 MED ORDER — ALBUMIN HUMAN 5 % IV SOLN
INTRAVENOUS | Status: AC
  Filled 2013-11-06: qty 250

## 2013-11-06 MED ORDER — STERILE WATER FOR INJECTION IV SOLN
INTRAVENOUS | Status: DC
  Administered 2013-11-06 – 2013-11-08 (×3): via INTRAVENOUS_CENTRAL
  Filled 2013-11-06 (×4): qty 150

## 2013-11-06 MED ORDER — HYDROCORTISONE NA SUCCINATE PF 100 MG IJ SOLR
50.0000 mg | Freq: Four times a day (QID) | INTRAMUSCULAR | Status: DC
  Administered 2013-11-06 – 2013-11-08 (×9): 50 mg via INTRAVENOUS
  Filled 2013-11-06 (×12): qty 1

## 2013-11-06 MED ORDER — SODIUM CHLORIDE 0.9 % FOR CRRT
INTRAVENOUS_CENTRAL | Status: DC | PRN
  Filled 2013-11-06: qty 1000

## 2013-11-06 NOTE — Progress Notes (Signed)
PT Cancellation Note  Patient Details Name: Nicholas [name] MRN: [id number] DOB: 1941-07-04   Cancelled Treatment:    Reason Eval/Treat Not Completed: Medical issues which prohibited therapy. Patient with decline in medical status.  On continuous dialysis currently.  Will hold PT for now.  Will check on patient in a few days for appropriateness for PT evaluation.   Despina Pole 11/06/2013, 5:13 PM Carita Pian. Sanjuana Kava, [place] Pager [phone]

## 2013-11-06 NOTE — Progress Notes (Signed)
PCCM  Long discussion with son and brother and patient.  All agree to DNR DNI Continue current medical care but no intubation/cpr/DCCV.  Saralyn Pilar WrightMD

## 2013-11-06 NOTE — Progress Notes (Signed)
PULMONARY / CRITICAL CARE MEDICINE   Name: Nicholas [name] MRN: [id number] DOB: 01/15/1941    ADMISSION DATE:  11/09/2013 CONSULTATION DATE:  11/05/2013  REFERRING MD :  Juanita Laster, MD PRIMARY SERVICE: CCS  CHIEF COMPLAINT:  Mechanical support  BRIEF PATIENT DESCRIPTION:  73 year old male w/ ESRD. Resides at [facility] 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 When he returned to his nursing home on 3/18 from dialysis, he was found to have eviscerated small bowel through his midline abdominal wound. He was brought emergently to the operating room for exploration where he underwent: lysis of adhesions, closure of abd wall, and placement of wound vac. He returned to the ICU on the vent. PCCM was asked to assist w/ care.    SIGNIFICANT EVENTS / STUDIES:  3/18: #1 exploratory laparotomy #2 lysis of adhesions #3 closure of abdominal wall #4 placement of abdominal wound VAC (20 cm) #5 revision of ileostomy 3/22 CT abd>>>  LINES / TUBES: ETT 3/18 >> 3/20 I IJ CVL 3/19 >>   CULTURES: BCX 2 3/22>>>  ANTIBIOTICS: Cefoxitin 3/18>>>3/22 vanc 3/22>>> Zosyn 3/22>>>   SUBJECTIVE:  No distress. BP boarderline   VITAL SIGNS: Temp:  [97.5 F (36.4 C)-98.1 F (36.7 C)] 97.5 F (36.4 C) (03/22 0754) Pulse Rate:  [71-123] 112 (03/22 0845) Resp:  [0-29] 12 (03/22 0845) BP: (53-132)/(26-116) 57/43 mmHg (03/22 0845) SpO2:  [87 %-100 %] 94 % (03/22 0845) HEMODYNAMICS:     INTAKE / OUTPUT: Intake/Output     03/21 0701 - 03/22 0700 03/22 0701 - 03/23 0700   P.O. 900 30   I.V. (mL/kg) 799.3 (10.2) 40.7 (0.5)   NG/GT     IV Piggyback 100    Total Intake(mL/kg) 1799.3 (23) 70.7 (0.9)   Urine (mL/kg/hr) 450 (0.2)    Drains 100 (0.1)    Other 394 (0.2)    Stool 300 (0.2)    Total Output 1244 0   Net +555.3 +70.7         2 liters  PHYSICAL EXAMINATION: General:  NAD, confused  Neuro: No focal  deficits HEENT: Poor dentition, otherwise WNL Cardiovascular:  RRR s M Lungs: diffuse rhonchi & rales. Some increase in WOB Abdomen:  Vac dressing intact, hypoactive  Ext: symmetric edema   LABS:  Recent Labs Lab 11/04/13 0515 11/05/13 0314 11/06/13 0430  NA 137 137 140  K 4.5 4.8 3.6*  CL 106 104 103  CO2 22 21 24   BUN 31* 35* 16  CREATININE 2.46* 2.86* 1.74*  GLUCOSE 101* 99 120*    Recent Labs Lab 11/04/13 0515 11/05/13 0314 11/06/13 0430  HGB 9.7* 9.2* 9.4*  HCT 31.4* 29.0* 29.6*  WBC 22.1* 23.0* 34.3*  PLT 263 199 242    CXR: increased edema pattern, L>R effusions worse .  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 Still pressor dependant   AFRVR, resolved P:  Try low dose levophed, ck cortisol and stress dose steroids Start midodrine (was on this only HD days, will start TID for now, should be able to taper w/ time) Resumed  enteral amiodarone   RENAL A:  ESRD. MWF P:   Mgmt per Renal Monitor BMET intermittently Monitor I/Os Correct electrolytes as indicated Min UOP  GASTROINTESTINAL A:   S/p right hemicolectomy w/ ileostomy 2/12 P lap for abd wall dehiscence P:   Post-op care per surgery  Nutrition per CCS, started clears 3/20 SUP: IV PPI   HEMATOLOGIC A:  Chronic anemia  P:  SCDs F/u cbc   INFECTIOUS A:   No fever but marked leukocytosis  P:   CT abd/pelvis (have spoke w/ surgery) Widen ABX for nosocomial coverage   ENDOCRINE A:  DM  P:   CBGs w/ ssi   NEUROLOGIC A:  Post-op pain      Mild delirium   P:   Supportive care  fent PRN  TODAY'S SUMMARY:   looks weaker, didn't tolerate  HD well. Still hypotensive and wbc ct climbing. CT abd/widen abx/change pressor to levophed/ stress dose steroids. Needs palliative care consult.   I have personally obtained a history, examined the patient, evaluated laboratory and imaging results, formulated the assessment and plan and placed orders.  CRITICAL CARE: The patient is critically ill with multiple organ systems failure and requires high complexity decision making for assessment and support, frequent evaluation and titration of therapies, application of advanced monitoring technologies and extensive interpretation of multiple databases. Critical Care Time devoted to patient care services described in this note is 30 minutes.     Mariel Sleet Beeper  [phone]  Cell  [phone]  If no response or cell goes to voicemail, call beeper [phone]  11/06/2013, 9:34 AM

## 2013-11-06 NOTE — Progress Notes (Signed)
ANTIBIOTIC CONSULT NOTE - INITIAL  Pharmacy Consult for Vancomycin and Zosyn Indication: rule out sepsis  Allergies  Allergen Reactions  . Statins Other (See Comments)    Muscle weakness     Patient Measurements: Height: 5\' 6"  (167.6 cm) Weight: 172 lb 6.4 oz (78.2 kg) IBW/kg (Calculated) : 63.8 Adjusted Body Weight:   Vital Signs: Temp: 97.5 F (36.4 C) (03/22 0754) Temp src: Oral (03/22 0754) BP: 57/43 mmHg (03/22 0845) Pulse Rate: 112 (03/22 0845) Intake/Output from previous day: 03/21 0701 - 03/22 0700 In: 1799.3 [P.O.:900; I.V.:799.3; IV Piggyback:100] Out: 8101 [Urine:450; Drains:100; Stool:300] Intake/Output from this shift: Total I/O In: 70.7 [P.O.:30; I.V.:40.7] Out: 0   Labs:  Recent Labs  11/04/13 0515 11/05/13 0314 11/06/13 0430  WBC 22.1* 23.0* 34.3*  HGB 9.7* 9.2* 9.4*  PLT 263 199 242  CREATININE 2.46* 2.86* 1.74*   Estimated Creatinine Clearance: 38.9 ml/min (by C-G formula based on Cr of 1.74). No results found for this basename: VANCOTROUGH, Corlis Leak, VANCORANDOM, [place], GENTPEAK, GENTRANDOM, TOBRATROUGH, TOBRAPEAK, TOBRARND, AMIKACINPEAK, AMIKACINTROU, AMIKACIN,  in the last 72 hours   Microbiology: Recent Results (from the past 720 hour(s))  CLOSTRIDIUM DIFFICILE BY PCR     Status: None   Collection Time    10/11/13  9:33 AM      Result Value Ref Range Status   C difficile by pcr NEGATIVE  NEGATIVE Final  MRSA PCR SCREENING     Status: None   Collection Time    11/09/2013  8:55 PM      Result Value Ref Range Status   MRSA by PCR NEGATIVE  NEGATIVE Final   Comment:            The GeneXpert MRSA Assay (FDA     approved for NASAL specimens     only), is one component of a     comprehensive MRSA colonization     surveillance program. It is not     intended to diagnose MRSA     infection nor to guide or     monitor treatment for     MRSA infections.    Medical History: Past Medical History  Diagnosis Date  . Coronary artery  disease     a. hx of MI 1992; b. 10/2012 NSTEMI (no cath 2/2 GIB);  c. 11/2012 MV: Inf defect w inf and septal HK (favor prior MI), EF 47%;  d. 12/2012 Cath: Severe 3vd, EF 45-50%;  e. 01/2013 CABG x 5 (LIMA->LAD, VG->D1, VG->OM1->OM2, VG->PDA)  . Diabetes mellitus   . Hypertension   . Arthritis   . Carotid artery disease     a. 01/2013 doppler: >80% RICA stenosis, <75% LICA stenosis;  b. 08/256 R CEA.  . Gastritis 2012 and 09/2011    treated for H Pylori in 12/2011  . Diabetic neuropathy   . Osteoporosis   . COPD (chronic obstructive pulmonary disease)   . Anxiety   . Depression   . ESRD (end stage renal disease)     M-W-Sa  [name]  . Diabetic nephropathy   . GERD (gastroesophageal reflux disease)   . Neuromuscular disorder     peripheral neurotherapy  . Anemia   . A-fib     a. Dx 10/2012, on amio, no anticoagulation 2/2 GIB 10/2012.  [name] GIB (gastrointestinal bleeding)     a. 10/2012, awaiting colonoscopy.    Medications:  Scheduled:  . amiodarone  200 mg Oral Daily  . antiseptic oral rinse  15 mL Mouth Rinse  QID  . chlorhexidine  15 mL Mouth Rinse BID  . heparin subcutaneous  5,000 Units Subcutaneous 3 times per day  . hydrocortisone sod succinate (SOLU-CORTEF) inj  50 mg Intravenous Q6H  . insulin aspart  0-9 Units Subcutaneous 6 times per day  . midodrine  2.5 mg Oral TID WC  . pantoprazole (PROTONIX) IV  40 mg Intravenous Q24H   Assessment: 73yo male ESRD with markedly increased WBC, though afebrile- POD#4 s/p exp lap with LOA, closure of abd wall, and placement of wound VAC 2nd wound dehiscence from abd surgery in Feb '15.  Pt has been receiving Cefoxitin, now to broaden abx coverage with change to Vanc and Zosyn.  He has not been tolerating HD, therefore will load with Vancomycin but hold on ordering future doses with HD until able to assess HD tolerance.  Goal of Therapy:  Vancomycin trough level 15-20 mcg/ml  Plan:  1-  Vancomycin 1500mg  IV x 1 2-  Zosyn 2.25gm  IV q8 3-  F/U cx and HD tolerance, redosing Vanc when appropriate  Gracy Bruins, [facility]

## 2013-11-06 NOTE — Progress Notes (Signed)
Post op wound dehiscence repair in ESRD  s/p right hemicolectomy for colon cancer  Hypotension(OP dialysis pre tx BP94/49 per tele report) on midodrine as OP  ABLA  Volume excess ESRD   Plan CVVHD for volume removal   Subjective: Interval History: IHD yesterday with -800cc fluid removed; now on pressors  Objective: Vital signs in last 24 hours: Temp:  [97.5 F (36.4 C)-98.1 F (36.7 C)] 97.5 F (36.4 C) (03/22 0754) Pulse Rate:  [71-123] 112 (03/22 0845) Resp:  [0-29] 12 (03/22 0845) BP: (53-132)/(26-116) 57/43 mmHg (03/22 0845) SpO2:  [87 %-100 %] 94 % (03/22 0845) Weight change:   Intake/Output from previous day: 03/21 0701 - 03/22 0700 In: 1799.3 [P.O.:900; I.V.:799.3; IV Piggyback:100] Out: 4098 [Urine:450; Drains:100; Stool:300] Intake/Output this shift: Total I/O In: 70.7 [P.O.:30; I.V.:40.7] Out: 0   General appearance: slowed mentation and toxic Resp: clear to auscultation bilaterally anteriorly with poor effort Cardio: regular rate and rhythm, S1, S2 normal, no murmur, click, rub or gallop Extremities: edema 1-2+  Lab Results:  Recent Labs  11/05/13 0314 11/06/13 0430  WBC 23.0* 34.3*  HGB 9.2* 9.4*  HCT 29.0* 29.6*  PLT 199 242   BMET:  Recent Labs  11/05/13 0314 11/06/13 0430  NA 137 140  K 4.8 3.6*  CL 104 103  CO2 21 24  GLUCOSE 99 120*  BUN 35* 16  CREATININE 2.86* 1.74*  CALCIUM 7.7* 7.6*   No results found for this basename: PTH,  in the last 72 hours Iron Studies: No results found for this basename: IRON, TIBC, TRANSFERRIN, FERRITIN,  in the last 72 hours Studies/Results: No results found.  Scheduled: . amiodarone  200 mg Oral Daily  . antiseptic oral rinse  15 mL Mouth Rinse QID  . chlorhexidine  15 mL Mouth Rinse BID  . heparin subcutaneous  5,000 Units Subcutaneous 3 times per day  . hydrocortisone sod succinate (SOLU-CORTEF) inj  50 mg Intravenous Q6H  . insulin aspart  0-9 Units Subcutaneous 6 times per day  . midodrine   2.5 mg Oral TID WC  . pantoprazole (PROTONIX) IV  40 mg Intravenous Q24H     LOS: 4 days   Ryleah Miramontes C 11/06/2013,10:09 AM

## 2013-11-06 NOTE — Progress Notes (Signed)
Patient ID: Nicholas [name], male   DOB: [birth year], 73 y.o.   MRN: [id number] 4 Days Post-Op  Subjective: Pt tolerating clears, having stool/gas in ostomy bag, but feels a bit worse today.    Objective: Vital signs in last 24 hours: Temp:  [97.5 F (36.4 C)-98.1 F (36.7 C)] 97.5 F (36.4 C) (03/22 0754) Pulse Rate:  [71-123] 112 (03/22 0845) Resp:  [0-29] 12 (03/22 0845) BP: (53-132)/(26-116) 57/43 mmHg (03/22 0845) SpO2:  [87 %-100 %] 94 % (03/22 0845) Last BM Date: 11/05/13  Intake/Output from previous day: 03/21 0701 - 03/22 0700 In: 1799.3 [P.O.:900; I.V.:799.3; IV Piggyback:100] Out: 9379 [Urine:450; Drains:100; Stool:300] Intake/Output this shift: Total I/O In: 70.7 [P.O.:30; I.V.:40.7] Out: 0   General appearance: no distress Resp: diminished breath sounds bibasilar Cardio: regular rate and rhythm GI: vac in place, ileostomy with some air in bag, few bs  Lab Results:   Recent Labs  11/05/13 0314 11/06/13 0430  WBC 23.0* 34.3*  HGB 9.2* 9.4*  HCT 29.0* 29.6*  PLT 199 242   BMET  Recent Labs  11/05/13 0314 11/06/13 0430  NA 137 140  K 4.8 3.6*  CL 104 103  CO2 21 24  GLUCOSE 99 120*  BUN 35* 16  CREATININE 2.86* 1.74*  CALCIUM 7.7* 7.6*   PT/INR No results found for this basename: LABPROT, INR,  in the last 72 hours ABG No results found for this basename: PHART, PCO2, PO2, HCO3,  in the last 72 hours  Studies/Results: No results found.  Anti-infectives: Anti-infectives   Start     Dose/Rate Route Frequency Ordered Stop   11/03/13 0700  cefOXitin (MEFOXIN) 2 g in dextrose 5 % 50 mL IVPB     2 g 100 mL/hr over 30 Minutes Intravenous Every 12 hours 11/11/2013 2043     11/04/2013 1845  cefOXitin (MEFOXIN) 1 g in dextrose 5 % 50 mL IVPB  Status:  Discontinued     1 g 100 mL/hr over 30 Minutes Intravenous 4 times per day 11/01/2013 1835 11/11/2013 2043      Assessment/Plan: S/p elap for evisceration  Continue clears today, await ileus to resolve  a little. CT to evaluate dramatic rise in WBCs pulm toilet, oob today Did not tolerate HD.  PCCM to get palliative care involved.   Will start sq heparin  Philander Ake 11/06/2013

## 2013-11-07 ENCOUNTER — Inpatient Hospital Stay (HOSPITAL_COMMUNITY): Payer: Medicare Other

## 2013-11-07 DIAGNOSIS — A419 Sepsis, unspecified organism: Secondary | ICD-10-CM

## 2013-11-07 LAB — GLUCOSE, CAPILLARY
GLUCOSE-CAPILLARY: 110 mg/dL — AB (ref 70–99)
GLUCOSE-CAPILLARY: 136 mg/dL — AB (ref 70–99)
Glucose-Capillary: 135 mg/dL — ABNORMAL HIGH (ref 70–99)
Glucose-Capillary: 97 mg/dL (ref 70–99)
Glucose-Capillary: 97 mg/dL (ref 70–99)

## 2013-11-07 LAB — CBC
HCT: 32.9 % — ABNORMAL LOW (ref 39.0–52.0)
Hemoglobin: 10.6 g/dL — ABNORMAL LOW (ref 13.0–17.0)
MCH: 33.9 pg (ref 26.0–34.0)
MCHC: 32.2 g/dL (ref 30.0–36.0)
MCV: 105.1 fL — AB (ref 78.0–100.0)
PLATELETS: 238 10*3/uL (ref 150–400)
RBC: 3.13 MIL/uL — AB (ref 4.22–5.81)
RDW: 20.3 % — ABNORMAL HIGH (ref 11.5–15.5)
WBC: 35.4 10*3/uL — ABNORMAL HIGH (ref 4.0–10.5)

## 2013-11-07 LAB — COMPREHENSIVE METABOLIC PANEL
ALT: 8 U/L (ref 0–53)
AST: 30 U/L (ref 0–37)
Albumin: 1.1 g/dL — ABNORMAL LOW (ref 3.5–5.2)
Alkaline Phosphatase: 242 U/L — ABNORMAL HIGH (ref 39–117)
BUN: 12 mg/dL (ref 6–23)
CALCIUM: 7.6 mg/dL — AB (ref 8.4–10.5)
CO2: 24 mEq/L (ref 19–32)
Chloride: 101 mEq/L (ref 96–112)
Creatinine, Ser: 1.33 mg/dL (ref 0.50–1.35)
GFR calc non Af Amer: 53 mL/min — ABNORMAL LOW (ref >90)
GFR, EST AFRICAN AMERICAN: 61 mL/min — AB (ref >90)
GLUCOSE: 135 mg/dL — AB (ref 70–99)
Potassium: 4.2 mEq/L (ref 3.7–5.3)
Sodium: 139 mEq/L (ref 137–147)
Total Bilirubin: 0.5 mg/dL (ref 0.3–1.2)
Total Protein: 4.6 g/dL — ABNORMAL LOW (ref 6.0–8.3)

## 2013-11-07 MED ORDER — SODIUM CHLORIDE 0.9 % IV SOLN
100.0000 mg | Freq: Every day | INTRAVENOUS | Status: DC
  Administered 2013-11-07 – 2013-11-08 (×2): 100 mg via INTRAVENOUS
  Filled 2013-11-07 (×2): qty 100

## 2013-11-07 MED ORDER — PIPERACILLIN-TAZOBACTAM 3.375 G IVPB 30 MIN
3.3750 g | Freq: Four times a day (QID) | INTRAVENOUS | Status: DC
  Administered 2013-11-07 – 2013-11-08 (×4): 3.375 g via INTRAVENOUS
  Filled 2013-11-07 (×6): qty 50

## 2013-11-07 MED ORDER — IOHEXOL 300 MG/ML  SOLN
25.0000 mL | INTRAMUSCULAR | Status: AC
  Administered 2013-11-07 (×2): 25 mL via ORAL

## 2013-11-07 MED ORDER — MIDODRINE HCL 5 MG PO TABS
10.0000 mg | ORAL_TABLET | Freq: Three times a day (TID) | ORAL | Status: DC
  Administered 2013-11-07 (×2): 10 mg via ORAL
  Filled 2013-11-07 (×6): qty 2

## 2013-11-07 MED ORDER — VANCOMYCIN HCL IN DEXTROSE 750-5 MG/150ML-% IV SOLN
750.0000 mg | INTRAVENOUS | Status: DC
  Administered 2013-11-07: 750 mg via INTRAVENOUS
  Filled 2013-11-07 (×2): qty 150

## 2013-11-07 MED ORDER — IOHEXOL 300 MG/ML  SOLN
80.0000 mL | Freq: Once | INTRAMUSCULAR | Status: AC | PRN
  Administered 2013-11-07: 80 mL via INTRAVENOUS

## 2013-11-07 NOTE — Progress Notes (Signed)
5 Days Post-Op  Subjective: Very sleepy, has taken some PO  Objective: Vital signs in last 24 hours: Temp:  [96.8 F (36 C)-98 F (36.7 C)] 98 F (36.7 C) (03/23 0739) Pulse Rate:  [91-133] 127 (03/23 0700) Resp:  [10-27] 15 (03/23 0800) BP: (62-114)/(38-79) 110/72 mmHg (03/23 0800) SpO2:  [86 %-100 %] 94 % (03/23 0800) FiO2 (%):  [55 %-100 %] 80 % (03/23 0800) Weight:  [157 lb 3 oz (71.3 kg)-159 lb 6.3 oz (72.3 kg)] 157 lb 3 oz (71.3 kg) (03/23 0400) Last BM Date: 11/05/13  Intake/Output from previous day: 03/22 0701 - 03/23 0700 In: 1672.7 [P.O.:90; I.V.:1182.7; IV Piggyback:400] Out: 2099 [Drains:50] Intake/Output this shift: Total I/O In: 43.4 [I.V.:43.4] Out: 331 [Drains:50; Other:281]  General appearance: cooperative Resp: clear to auscultation bilaterally Cardio: tachy 130s GI: soft, mild TTP R, VAC on midline wound, ileostomy with brown stool  Lab Results:   Recent Labs  11/06/13 0430 11/07/13 0330  WBC 34.3* 35.4*  HGB 9.4* 10.6*  HCT 29.6* 32.9*  PLT 242 238   BMET  Recent Labs  11/06/13 0430 11/07/13 0330  NA 140 139  K 3.6* 4.2  CL 103 101  CO2 24 24  GLUCOSE 120* 135*  BUN 16 12  CREATININE 1.74* 1.33  CALCIUM 7.6* 7.6*   PT/INR No results found for this basename: LABPROT, INR,  in the last 72 hours ABG No results found for this basename: PHART, PCO2, PO2, HCO3,  in the last 72 hours  Studies/Results: Dg Chest Port 1 View  11/07/2013   CLINICAL DATA:  Pulmonary edema .  EXAM: PORTABLE CHEST - 1 VIEW  COMPARISON:  DG CHEST 1V PORT dated 11/06/2013; DG CHEST 1V PORT dated 11/04/2013  FINDINGS: Bilateral jugular central lines in stable position. Prior CABG. Heart size normal. Bilateral prominent pulmonary infiltrates most consistent with pneumonia noted. Infiltrate right upper lobe is increased. Left-sided pleural effusion. This is stable. No pneumothorax. Mild gastric distention. Degenerative changes both shoulders.  IMPRESSION: 1. Stable  positioning of IJ lines. 2. Severe bilateral pulmonary infiltrates with progression of right upper lobe infiltrate. These changes are most likely related to pneumonia. Pulmonary edema cannot be completely excluded. 3. Left-sided pleural effusion. 4. Prior CABG.  Heart size and pulmonary vascularity normal.   Electronically Signed   By: Marcello Moores  Register   On: 11/07/2013 08:18   Dg Chest Port 1 View  11/06/2013   CLINICAL DATA:  Increased shortness of breath, history hypertension, diabetes, coronary artery disease post CABG  EXAM: PORTABLE CHEST - 1 VIEW  COMPARISON:  Portable exam 0614 hr compared to 11/04/2013  FINDINGS: Interval removal of nasogastric and endotracheal tubes.  Bilateral jugular line is stable.  Rotated to the right.  Upper normal heart size.  Bilateral pulmonary infiltrates and basilar atelectasis.  Left pleural effusion.  No pneumothorax.  Bones demineralized with bilateral glenohumeral degenerative changes.  IMPRESSION: Persistent bilateral pulmonary infiltrates, basilar atelectasis and left pleural effusion.   Electronically Signed   By: Lavonia Dana M.D.   On: 11/06/2013 10:30    Anti-infectives: Anti-infectives   Start     Dose/Rate Route Frequency Ordered Stop   11/07/13 1000  micafungin (MYCAMINE) 100 mg in sodium chloride 0.9 % 100 mL IVPB     100 mg 100 mL/hr over 1 Hours Intravenous Daily 11/07/13 0913     11/06/13 1200  vancomycin (VANCOCIN) 1,500 mg in sodium chloride 0.9 % 250 mL IVPB     1,500 mg 125 mL/hr over  120 Minutes Intravenous  Once 11/06/13 1146 11/06/13 1506   11/06/13 1100  vancomycin (VANCOCIN) 1,500 mg in sodium chloride 0.9 % 500 mL IVPB  Status:  Discontinued     1,500 mg 250 mL/hr over 120 Minutes Intravenous  Once 11/06/13 1036 11/06/13 1147   11/06/13 1100  piperacillin-tazobactam (ZOSYN) IVPB 2.25 g     2.25 g 100 mL/hr over 30 Minutes Intravenous 3 times per day 11/06/13 1036     11/03/13 0700  cefOXitin (MEFOXIN) 2 g in dextrose 5 % 50 mL IVPB   Status:  Discontinued     2 g 100 mL/hr over 30 Minutes Intravenous Every 12 hours 10/31/2013 2043 11/06/13 0948   11/07/2013 1845  cefOXitin (MEFOXIN) 1 g in dextrose 5 % 50 mL IVPB  Status:  Discontinued     1 g 100 mL/hr over 30 Minutes Intravenous 4 times per day 10/16/2013 1835 11/07/2013 2043      Assessment/Plan: s/p Procedure(s): EXPLORATORY LAPAROTOMY with Closure of Abdominal Wall (N/A) APPLICATION OF WOUND VAC (N/A) Ileostomy REVISION (Right) S/P closure of dehiscence POD#5 Leukocytosis - I D/W CCM, plan CT A/P Resp failure - worsened over the weekend, noted goals of care discussion by Dr. Joya Gaskins - DNR/DNI ID - Vanc/Zosyn, CCM adding antifungal CRF - CRRT per renal FEN - clears   LOS: 5 days    Nicholas [name] E 11/07/2013

## 2013-11-07 NOTE — Progress Notes (Signed)
Subjective: no complaints  Filed Vitals:   11/07/13 1200 11/07/13 1215 11/07/13 1230 11/07/13 1235  BP: 81/63 89/58 84/61  84/61  Pulse:  126 126 125  Temp:      TempSrc:      Resp: 25 21 19 20   Height:      Weight:      SpO2: 95% 98% 95% 90%   Exam: Alert, pale elderly male, not in distress, on FM O2 No jvd Chest clear bilat RRR no MRG Abd +ostomy RLQ and midline wound vac in place Diffuse pitting edema bilat LE's 2-3+ Neuro is nf, ox3 R IJ Cath / L AVF no bruit   Dialysis: MWF [facility] 4h   63kg   2/2.25 [facility]   Heparin 1000   R IJ Cath / LUA AVF (AVF occluded, plans were for OP revision after abdominal issues resolved) Hectorol none     Epo 7800     Venofer 100/hd thru 11/07/13 OP labs: tsat 18% pth 54   Assessment: 1 S/P closure of wound dihiscence 3/18 2 Recent hemicolectomy for colon Ca- 2/12 3 ESRD on hemodialysis 4 DNR 5 Anemia Hb 10.6 6 Hypotension- chronic issue on midodrine 7 Vol excess diffuse LE edema, up 8kg 8 Pulm infiltrates- pna and/or edema, worsening   Plan- cont CRRT , UF 75-100/hr as tolerated. Poor prognosis. DNR    Kelly Splinter MD  pager [phone]    cell [phone]  11/07/2013, 12:37 PM     Recent Labs Lab 11/05/13 0314 11/06/13 0430 11/07/13 0330  NA 137 140 139  K 4.8 3.6* 4.2  CL 104 103 101  CO2 21 24 24   GLUCOSE 99 120* 135*  BUN 35* 16 12  CREATININE 2.86* 1.74* 1.33  CALCIUM 7.7* 7.6* 7.6*    Recent Labs Lab 11/05/13 0314 11/06/13 0430 11/07/13 0330  AST 10 16 30   ALT 6 7 8   ALKPHOS 178* 199* 242*  BILITOT 0.2* 0.4 0.5  PROT 3.8* 4.4* 4.6*  ALBUMIN 0.7* 1.4* 1.1*    Recent Labs Lab 11/11/2013 1805  11/05/13 0314 11/06/13 0430 11/07/13 0330  WBC QUESTIONABLE RESULTS, RECOMMEND RECOLLECT TO VERIFY  < > 23.0* 34.3* 35.4*  NEUTROABS 9.8*  --   --   --   --   HGB 11.2*  < > 9.2* 9.4* 10.6*  HCT 36.6*  < > 29.0* 29.6* 32.9*  MCV 109.3*  < > 108.2* 106.5* 105.1*  PLT QUESTIONABLE RESULTS, RECOMMEND RECOLLECT TO  VERIFY  < > 199 242 238  < > = values in this interval not displayed. [name] amiodarone  200 mg Oral Daily  . antiseptic oral rinse  15 mL Mouth Rinse QID  . chlorhexidine  15 mL Mouth Rinse BID  . heparin subcutaneous  5,000 Units Subcutaneous 3 times per day  . hydrocortisone sod succinate (SOLU-CORTEF) inj  50 mg Intravenous Q6H  . insulin aspart  0-9 Units Subcutaneous 6 times per day  . micafungin [facility]) IV  100 mg Intravenous Daily  . midodrine  10 mg Oral TID WC  . pantoprazole (PROTONIX) IV  40 mg Intravenous Q24H  . piperacillin-tazobactam  3.375 g Intravenous 4 times per day  . vancomycin  750 mg Intravenous Q24H   . sodium chloride 10 mL/hr at 11/04/13 0900  . phenylephrine (NEO-SYNEPHRINE) Adult infusion Stopped (11/06/13 1400)  . dialysis replacement fluid (prismasate) 200 mL/hr at 11/06/13 1319  . dialysate (PRISMASATE) 1,000 mL/hr at 11/07/13 0930  . sodium bicarbonate (isotonic) 1000 mL infusion 50 mL/hr  at 11/07/13 0528   sodium chloride, sodium chloride, feeding supplement (NEPRO CARB STEADY), fentaNYL, heparin, heparin, heparin, lidocaine (PF), lidocaine-prilocaine, ondansetron (ZOFRAN) IV, ondansetron, pentafluoroprop-tetrafluoroeth, phenol, sodium chloride

## 2013-11-07 NOTE — Progress Notes (Addendum)
PULMONARY / CRITICAL CARE MEDICINE   Name: Nicholas [name] MRN: [id number] DOB: Dec 25, 1940    ADMISSION DATE:  11/06/2013 CONSULTATION DATE:  10/27/2013  REFERRING MD :  CCS PRIMARY SERVICE: CCS  CHIEF COMPLAINT:  Mechanical support  BRIEF PATIENT DESCRIPTION: 73 yo NH resident with ESRD s/p  exploratory laparotomy right hemicolectomy and ileostomy for obstructing colon cancer with perforation of the right colon on 2/12. Readmitted 3/18 with abdominal wound dehiscence.    SIGNIFICANT EVENTS / STUDIES:  3/18  OR >>> Exploratory laparotomy, lysis of adhesions, closure of abdominal wall, VAC   LINES / TUBES: ETT 3/18 >>> 3/20 I IJ CVL 3/19 >>> [place] HD cath R chest ??? >>>  CULTURES: 3/18  MRSA PCR >>> neg  ANTIBIOTICS: Cefoxitin 3/18 >>> 3/22 Vancomycin 3/22 >>> Zosyn 3/22 >>>  INTERVAL HISTORY: No acute overnight events.  VITAL SIGNS: Temp:  [96.8 F (36 C)-98 F (36.7 C)] 98 F (36.7 C) (03/23 0739) Pulse Rate:  [91-133] 127 (03/23 0700) Resp:  [10-27] 15 (03/23 0800) BP: (62-114)/(38-79) 110/72 mmHg (03/23 0800) SpO2:  [86 %-100 %] 94 % (03/23 0800) FiO2 (%):  [55 %-100 %] 80 % (03/23 0800) Weight:  [71.3 kg (157 lb 3 oz)-72.3 kg (159 lb 6.3 oz)] 71.3 kg (157 lb 3 oz) (03/23 0400)  HEMODYNAMICS:   Vent Mode:  [-]  FiO2 (%):  [55 %-100 %] 80 %  INTAKE / OUTPUT: Intake/Output     03/22 0701 - 03/23 0700 03/23 0701 - 03/24 0700   P.O. 90    I.V. (mL/kg) 1182.7 (16.6) 43.4 (0.6)   IV Piggyback 400    Total Intake(mL/kg) 1672.7 (23.5) 43.4 (0.6)   Urine (mL/kg/hr)     Drains 50 (0) 50 (0.3)   Other 2049 (1.2) 281 (1.9)   Stool     Total Output 2099 331   Net -426.3 -287.6         PHYSICAL EXAMINATION: General:  No distress, ill appearing Neuro: sleepy, but arouses to voice HEENT: Dry membranes, NRB mask on Cardiovascular:  Regular, tachycardic Lungs: Bilateral diminished air entry Abdomen:  Wound VAC in place, viable ostomy Ext:  Anasarca  LABS: CBC  Recent Labs Lab 11/05/13 0314 11/06/13 0430 11/07/13 0330  WBC 23.0* 34.3* 35.4*  HGB 9.2* 9.4* 10.6*  HCT 29.0* 29.6* 32.9*  PLT 199 242 238   Coag's  Recent Labs Lab 10/28/2013 1805  INR 1.26   BMET  Recent Labs Lab 11/05/13 0314 11/06/13 0430 11/07/13 0330  NA 137 140 139  K 4.8 3.6* 4.2  CL 104 103 101  CO2 21 24 24   BUN 35* 16 12  CREATININE 2.86* 1.74* 1.33  GLUCOSE 99 120* 135*   Electrolytes  Recent Labs Lab 11/05/13 0314 11/06/13 0430 11/07/13 0330  CALCIUM 7.7* 7.6* 7.6*   Sepsis Markers No results found for this basename: LATICACIDVEN, PROCALCITON, O2SATVEN,  in the last 168 hours  ABG  Recent Labs Lab 10/25/2013 2139 11/03/13 0410  PHART 7.480* 7.353  PCO2ART 34.1* 35.9  PO2ART 267.0* 107.0*   Liver Enzymes  Recent Labs Lab 11/05/13 0314 11/06/13 0430 11/07/13 0330  AST 10 16 30   ALT 6 7 8   ALKPHOS 178* 199* 242*  BILITOT 0.2* 0.4 0.5  ALBUMIN 0.7* 1.4* 1.1*   Cardiac Enzymes No results found for this basename: TROPONINI, PROBNP,  in the last 168 hours Glucose  Recent Labs Lab 11/06/13 1201 11/06/13 1555 11/06/13 1944 11/06/13 2343 11/07/13 0358 11/07/13 0736  GLUCAP 116*  142* 90 99 135* 97   IMAGING: Dg Chest Port 1 View  11/07/2013   CLINICAL DATA:  Pulmonary edema .  EXAM: PORTABLE CHEST - 1 VIEW  COMPARISON:  DG CHEST 1V PORT dated 11/06/2013; DG CHEST 1V PORT dated 11/04/2013  FINDINGS: Bilateral jugular central lines in stable position. Prior CABG. Heart size normal. Bilateral prominent pulmonary infiltrates most consistent with pneumonia noted. Infiltrate right upper lobe is increased. Left-sided pleural effusion. This is stable. No pneumothorax. Mild gastric distention. Degenerative changes both shoulders.  IMPRESSION: 1. Stable positioning of IJ lines. 2. Severe bilateral pulmonary infiltrates with progression of right upper lobe infiltrate. These changes are most likely related to pneumonia.  Pulmonary edema cannot be completely excluded. 3. Left-sided pleural effusion. 4. Prior CABG.  Heart size and pulmonary vascularity normal.   Electronically Signed   By: Marcello Moores  Register   On: 11/07/2013 08:18   Dg Chest Port 1 View  11/06/2013   CLINICAL DATA:  Increased shortness of breath, history hypertension, diabetes, coronary artery disease post CABG  EXAM: PORTABLE CHEST - 1 VIEW  COMPARISON:  Portable exam 0614 hr compared to 11/04/2013  FINDINGS: Interval removal of nasogastric and endotracheal tubes.  Bilateral jugular line is stable.  Rotated to the right.  Upper normal heart size.  Bilateral pulmonary infiltrates and basilar atelectasis.  Left pleural effusion.  No pneumothorax.  Bones demineralized with bilateral glenohumeral degenerative changes.  IMPRESSION: Persistent bilateral pulmonary infiltrates, basilar atelectasis and left pleural effusion.   Electronically Signed   By: Lavonia Dana M.D.   On: 11/06/2013 10:30   ASSESSMENT / PLAN:  PULMONARY A:  Acute respiratory failure in setting of sepsis / abdominal surgery Bilateral pleural effusions in setting of fluid overload Do NOT intubate P:   Supplemental oxygen Pulmonary hygiene / IS / Flutter valve  CARDIOVASCULAR A:  Goal MAP 50-55 Sepsis, likely intraabdominal source Chronic hypotension AF / RVR, resolved Sinus tachycardia P:  Do NOT resuscitate D/c Levophed Start Neo-Synephrine Add Midodrine Amiodarone  RENAL A:   ESRD on HD P:   Trend BMP CVVHD per Renal  GASTROINTESTINAL A:   Right hemicolectomy with ileostomy Abd wall dehiscence Nutrition GI Px P:   Clear liquids Protonix  HEMATOLOGIC A:   Chronic anemia  VTE Px P:  Trend CBC SCDs  INFECTIOUS A:   Probable intraabdominal sepsis P:   Abx / cx as above CT abdomen / pelvis Add Micafungin   ENDOCRINE A:   DM  Presumed adrenal insufficiency P:   SSI Stress dose steroids  NEUROLOGIC A:   Post-op pain Mild delirium   P:    Supportive care  Fentanyl PRN  I have personally obtained history, examined patient, evaluated and interpreted laboratory and imaging results, reviewed medical records, formulated assessment / plan and placed orders.  CRITICAL CARE:  The patient is critically ill with multiple organ systems failure and requires high complexity decision making for assessment and support, frequent evaluation and titration of therapies, application of advanced monitoring technologies and extensive interpretation of multiple databases. Critical Care Time devoted to patient care services described in this note is 35 minutes.   Doree Fudge, MD Pulmonary and [place] Pager: [phone]  11/07/2013, 9:25 AM

## 2013-11-07 NOTE — Progress Notes (Signed)
Pt BP 64-71/50-55 on Neo gtt at 336mcg/min which is the max, Spoke with Dr Joya Gaskins regarding BP. Response was that we are not making any changes and to continue with current regimen.

## 2013-11-07 NOTE — Progress Notes (Signed)
ANTIBIOTIC CONSULT NOTE - Follow-up  Pharmacy Consult for Vancomycin and Zosyn Indication: rule out sepsis  Allergies  Allergen Reactions  . Statins Other (See Comments)    Muscle weakness     Patient Measurements: Height: 5\' 6"  (167.6 cm) Weight: 157 lb 3 oz (71.3 kg) IBW/kg (Calculated) : 63.8 Adjusted Body Weight:   Vital Signs: Temp: 98 F (36.7 C) (03/23 0739) Temp src: Oral (03/23 0739) BP: 108/78 mmHg (03/23 0900) Pulse Rate: 135 (03/23 0900) Intake/Output from previous day: 03/22 0701 - 03/23 0700 In: 1672.7 [P.O.:90; I.V.:1182.7; IV Piggyback:400] Out: 2099 [Drains:50] Intake/Output from this shift: Total I/O In: 43.4 [I.V.:43.4] Out: 331 [Drains:50; Other:281]  Labs:  Recent Labs  11/05/13 0314 11/06/13 0430 11/07/13 0330  WBC 23.0* 34.3* 35.4*  HGB 9.2* 9.4* 10.6*  PLT 199 242 238  CREATININE 2.86* 1.74* 1.33   Estimated Creatinine Clearance: 46.6 ml/min (by C-G formula based on Cr of 1.33). No results found for this basename: VANCOTROUGH, Corlis Leak, VANCORANDOM, Merna, GENTPEAK, GENTRANDOM, TOBRATROUGH, TOBRAPEAK, TOBRARND, AMIKACINPEAK, AMIKACINTROU, AMIKACIN,  in the last 72 hours   Microbiology: Recent Results (from the past 720 hour(s))  CLOSTRIDIUM DIFFICILE BY PCR     Status: None   Collection Time    10/11/13  9:33 AM      Result Value Ref Range Status   C difficile by pcr NEGATIVE  NEGATIVE Final  MRSA PCR SCREENING     Status: None   Collection Time    10/23/2013  8:55 PM      Result Value Ref Range Status   MRSA by PCR NEGATIVE  NEGATIVE Final   Comment:            The GeneXpert MRSA Assay (FDA     approved for NASAL specimens     only), is one component of a     comprehensive MRSA colonization     surveillance program. It is not     intended to diagnose MRSA     infection nor to guide or     monitor treatment for     MRSA infections.    Assessment: 73yo male with history of ESRD on HD initiated on vancomycin and zosyn  for possible sepsis. Pt is afebrile but WBC remains significantly elevated at 35.4. Pt has not been tolerating HD so was switched to CVVHDF. Micafungin was also added today for anti-fungal coverage. No culture data available.   Cefoxitin 3/18 >> 3/22 Zosyn 3/22 >> Vanco 3/22 >> Mica 3/23>>  Goal of Therapy:  Vancomycin trough level 15-20 mcg/ml  Plan:  1. Change zosyn to 3.375gm IV Q6H 2. Vancomycin 750mg  IV Q24H 3. F/u renal plans, C&S, clinical status and trough at [facility], PharmD, BCPS Pager # [phone] 11/07/2013 10:23 AM

## 2013-11-07 NOTE — Progress Notes (Signed)
MD Joya Gaskins made aware of soft BP.  States may go up to 300 mcg of neo.  Will increase and continue to monitor.

## 2013-11-07 NOTE — Clinical Documentation Improvement (Signed)
Possible Clinical Conditions?  Septicemia / Sepsis Severe Sepsis Neutropenic Sepsis  SIRS Septic Shock Sepsis with UTI Sepsis due to an internal device  Bacterial infection of unknown etiology / source Other Condition  Cannot clinically Determine   Risk Factors: R/O sepsis noted per 3/22 pharmacy consult for vancomycin and zosyn.  Thank You, Theron Arista, Clinical Documentation Specialist:  [place] Information Management ________________________________________________________________________________________________   Possible Clinical Conditions?  Pneumonia Other Condition Cannot Clinically Determine   Risk Factors: Pneumonia listed under pulmonary section of 3/18 pre-op evaluation. CXR of 3/21: persistent bilateral pulmonary infiltrates, basilar atelectasis, and left pleural effusion.  Thank You,  Theron Arista, Clinical Documentation Specialist. [phone] [facility] Information [facility]

## 2013-11-07 NOTE — Progress Notes (Signed)
OT Cancellation Note and Discharge  Patient Details Name: ADRIN JULIAN MRN: [id number] DOB: 1941-06-07   Cancelled Treatment:    Reason Eval/Treat Not Completed: Patient not medically ready. Pt on CVVHD and admitted to ICU due to respiratory issues as well. Acute OT will sign off, please reorder as appropriate.   Almon Register [phone] 11/07/2013, 10:55 AM

## 2013-11-07 NOTE — Progress Notes (Signed)
Patient nauseous after taking PO amiodarone and small sips of liquids.  IV Zofran given.  Attempted to give oral contrast for scheduled CT; patient took some small sips but states that he is tired and "can't drink anymore."  MD Grandville Silos made aware; states that IV contrast will suffice.  CT tech also made aware; will do IV contrast, but recommended to encourage patient to take small sips as he can.  Will do so and continue to monitor.

## 2013-11-08 DIAGNOSIS — Z515 Encounter for palliative care: Secondary | ICD-10-CM

## 2013-11-08 LAB — RENAL FUNCTION PANEL
ALBUMIN: 0.9 g/dL — AB (ref 3.5–5.2)
BUN: 9 mg/dL (ref 6–23)
CO2: 24 mEq/L (ref 19–32)
CREATININE: 0.96 mg/dL (ref 0.50–1.35)
Calcium: 7.4 mg/dL — ABNORMAL LOW (ref 8.4–10.5)
Chloride: 98 mEq/L (ref 96–112)
GFR, EST NON AFRICAN AMERICAN: 82 mL/min — AB (ref >90)
Glucose, Bld: 146 mg/dL — ABNORMAL HIGH (ref 70–99)
POTASSIUM: 4.2 meq/L (ref 3.7–5.3)
Phosphorus: 1.4 mg/dL — ABNORMAL LOW (ref 2.3–4.6)
Sodium: 136 mEq/L — ABNORMAL LOW (ref 137–147)

## 2013-11-08 LAB — GLUCOSE, CAPILLARY
GLUCOSE-CAPILLARY: 120 mg/dL — AB (ref 70–99)
Glucose-Capillary: 111 mg/dL — ABNORMAL HIGH (ref 70–99)
Glucose-Capillary: 165 mg/dL — ABNORMAL HIGH (ref 70–99)

## 2013-11-08 MED ORDER — PIPERACILLIN-TAZOBACTAM IN DEX 2-0.25 GM/50ML IV SOLN
2.2500 g | Freq: Three times a day (TID) | INTRAVENOUS | Status: DC
  Filled 2013-11-08 (×2): qty 50

## 2013-11-08 MED ORDER — PHENYLEPHRINE HCL 10 MG/ML IJ SOLN
30.0000 ug/min | INTRAVENOUS | Status: DC
  Administered 2013-11-08 (×2): 300 ug/min via INTRAVENOUS
  Filled 2013-11-08 (×2): qty 4

## 2013-11-08 MED ORDER — HEPARIN SODIUM (PORCINE) 1000 UNIT/ML DIALYSIS
1000.0000 [IU] | INTRAMUSCULAR | Status: DC | PRN
  Administered 2013-11-08: 4000 [IU] via INTRAVENOUS_CENTRAL
  Filled 2013-11-08: qty 6

## 2013-11-08 MED ORDER — MORPHINE SULFATE 2 MG/ML IJ SOLN
2.0000 mg | INTRAMUSCULAR | Status: DC | PRN
  Administered 2013-11-08 (×2): 2 mg via INTRAVENOUS
  Filled 2013-11-08: qty 2

## 2013-11-16 ENCOUNTER — Encounter (INDEPENDENT_AMBULATORY_CARE_PROVIDER_SITE_OTHER): Payer: Medicare Other | Admitting: General Surgery

## 2013-11-16 NOTE — Progress Notes (Signed)
Subjective: BP very low overnight, groggy and poorly responsive today. BP 70's now on neo gtt.   Filed Vitals:   12/02/2013 0400 02-Dec-2013 0500 Dec 02, 2013 0600 December 02, 2013 0700  BP: 64/44 62/42 72/43  60/42  Pulse:      Temp:   95.9 F (35.5 C)   TempSrc:   Axillary   Resp: 23 17 19 13   Height:      Weight:  69.5 kg (153 lb 3.5 oz)    SpO2: 92% 99% 98% 99%   Exam: Difficult to arouse No jvd Chest clear bilat RRR no MRG Abd +ostomy RLQ and midline wound vac in place Diffuse pitting edema bilat LE's 2-3+ Neuro is nf, ox3 R IJ Cath / L AVF no bruit   Dialysis: MWF [facility] 4h   63kg   2/2.25 [facility]   Heparin 1000   R IJ Cath / LUA AVF (AVF occluded, plans were for OP revision after abdominal issues resolved) Hectorol none     Epo 7800     Venofer 100/hd thru 11/07/13 OP labs: tsat 18% pth 54   Assessment: 1 S/P closure of wound dihiscence 3/18 2 Recent hemicolectomy for colon Ca- 2/12 3 ESRD 4 Severe shock on vasopressors- worse 5 Anemia Hb 10.6 7 Vol excess / diffuse edema 8 Resp failure / pulm infiltrates- likely edema in part   Plan- not tolerating CRRT at this point, will stop.  Prognosis very poor, spoke with pt's brother who is aware.    Kelly Splinter MD  pager [phone]    cell [phone]  12-02-13, 9:43 AM     Recent Labs Lab 11/06/13 0430 11/07/13 0330 12/02/13 0355  NA 140 139 136*  K 3.6* 4.2 4.2  CL 103 101 98  CO2 24 24 24   GLUCOSE 120* 135* 146*  BUN 16 12 9   CREATININE 1.74* 1.33 0.96  CALCIUM 7.6* 7.6* 7.4*  PHOS  --   --  1.4*    Recent Labs Lab 11/05/13 0314 11/06/13 0430 11/07/13 0330 12/02/2013 0355  AST 10 16 30   --   ALT 6 7 8   --   ALKPHOS 178* 199* 242*  --   BILITOT 0.2* 0.4 0.5  --   PROT 3.8* 4.4* 4.6*  --   ALBUMIN 0.7* 1.4* 1.1* 0.9*    Recent Labs Lab 10/19/2013 1805  11/05/13 0314 11/06/13 0430 11/07/13 0330  WBC QUESTIONABLE RESULTS, RECOMMEND RECOLLECT TO VERIFY  < > 23.0* 34.3* 35.4*  NEUTROABS 9.8*  --   --   --   --    HGB 11.2*  < > 9.2* 9.4* 10.6*  HCT 36.6*  < > 29.0* 29.6* 32.9*  MCV 109.3*  < > 108.2* 106.5* 105.1*  PLT QUESTIONABLE RESULTS, RECOMMEND RECOLLECT TO VERIFY  < > 199 242 238  < > = values in this interval not displayed. [name] amiodarone  200 mg Oral Daily  . antiseptic oral rinse  15 mL Mouth Rinse QID  . chlorhexidine  15 mL Mouth Rinse BID  . heparin subcutaneous  5,000 Units Subcutaneous 3 times per day  . hydrocortisone sod succinate (SOLU-CORTEF) inj  50 mg Intravenous Q6H  . insulin aspart  0-9 Units Subcutaneous 6 times per day  . micafungin [facility]) IV  100 mg Intravenous Daily  . midodrine  10 mg Oral TID WC  . pantoprazole (PROTONIX) IV  40 mg Intravenous Q24H  . piperacillin-tazobactam  3.375 g Intravenous 4 times per day  . vancomycin  750 mg Intravenous Q24H   .  sodium chloride 10 mL/hr at 11/04/13 0900  . phenylephrine (NEO-SYNEPHRINE) Adult infusion 300 mcg/min (10/28/2013 0700)  . dialysis replacement fluid (prismasate) 200 mL/hr at 11/07/13 1621  . dialysate (PRISMASATE) 1,000 mL/hr at 10/23/2013 0743  . sodium bicarbonate (isotonic) 1000 mL infusion 50 mL/hr at 10/16/2013 0035   sodium chloride, sodium chloride, feeding supplement (NEPRO CARB STEADY), fentaNYL, heparin, heparin, heparin, lidocaine (PF), lidocaine-prilocaine, ondansetron (ZOFRAN) IV, ondansetron, pentafluoroprop-tetrafluoroeth, phenol, sodium chloride

## 2013-11-16 NOTE — Progress Notes (Addendum)
Patient asystole on monitor.  Not breathing and no heart tones auscultated.  No BP noted.  Pronounced by 2 RN's @ 1841.  Pronounced by Currie Paris. RN  and Beckie Salts, RN  Family all at the bedside.  Support given to family. Dr. Georgette Dover CCS notified of patient's death as well. MD notified of time of death.

## 2013-11-16 NOTE — Progress Notes (Signed)
PULMONARY / CRITICAL CARE MEDICINE   Name: Nicholas [name] MRN: [id number] DOB: 1941-03-23    ADMISSION DATE:  11/25/2013 CONSULTATION DATE:  2013/11/25  REFERRING MD :  CCS PRIMARY SERVICE: CCS  CHIEF COMPLAINT:  Mechanical support  BRIEF PATIENT DESCRIPTION: 73 yo NH resident with ESRD s/p  exploratory laparotomy right hemicolectomy and ileostomy for obstructing colon cancer with perforation of the right colon on 2/12. Readmitted 3/18 with abdominal wound dehiscence.    SIGNIFICANT EVENTS / STUDIES:  3/18  OR >>> Exploratory laparotomy, lysis of adhesions, closure of abdominal wall, VAC  3/24  CT abdomen >>> resolution of pneumoperitoneum, new bilateral airspace disease, effusions, ascites   LINES / TUBES: ETT 3/18 >>> 3/20 I IJ CVL 3/19 >>> [place] HD cath R chest ??? >>>  CULTURES: 3/18  MRSA PCR >>> neg  ANTIBIOTICS: Cefoxitin 3/18 >>> 3/22 Vancomycin 3/22 >>> Zosyn 3/22 >>>  INTERVAL HISTORY: Progressive hemodynamic deterioration overnight.  VITAL SIGNS: Temp:  [95.8 F (35.4 C)-97.8 F (36.6 C)] 95.9 F (35.5 C) (03/24 0600) Pulse Rate:  [26-133] 29 (03/23 1930) Resp:  [11-28] 13 (03/24 0700) BP: (54-103)/(40-81) 60/42 mmHg (03/24 0700) SpO2:  [90 %-100 %] 99 % (03/24 0700) FiO2 (%):  [100 %] 100 % (03/24 0700) Weight:  [69.5 kg (153 lb 3.5 oz)] 69.5 kg (153 lb 3.5 oz) (03/24 0500)  HEMODYNAMICS:   Vent Mode:  [-]  FiO2 (%):  [100 %] 100 %  INTAKE / OUTPUT: Intake/Output     03/23 0701 - 03/24 0700 03/24 0701 - 03/25 0700   P.O. 650    I.V. (mL/kg) 2112.8 (30.4)    IV Piggyback 450    Total Intake(mL/kg) 3212.8 (46.2)    Drains 150    Other 2987    Stool 50    Total Output 3187     Net +25.8          Urine Occurrence 1 x     PHYSICAL EXAMINATION: General:  Comfortable, appears acutely ill Neuro: Unresponsive, non focal HEENT: Dry membranes, NRB mask on Cardiovascular:  Regular, tachycardic Lungs: Bilateral diminished air entry Abdomen:   Wound VAC in place, viable ostomy Ext: Anasarca  LABS: CBC  Recent Labs Lab 11/05/13 0314 11/06/13 0430 11/07/13 0330  WBC 23.0* 34.3* 35.4*  HGB 9.2* 9.4* 10.6*  HCT 29.0* 29.6* 32.9*  PLT 199 242 238   Coag's  Recent Labs Lab 25-Nov-2013 1805  INR 1.26   BMET  Recent Labs Lab 11/06/13 0430 11/07/13 0330 11/06/2013 0355  NA 140 139 136*  K 3.6* 4.2 4.2  CL 103 101 98  CO2 24 24 24   BUN 16 12 9   CREATININE 1.74* 1.33 0.96  GLUCOSE 120* 135* 146*   Electrolytes  Recent Labs Lab 11/06/13 0430 11/07/13 0330 10/29/2013 0355  CALCIUM 7.6* 7.6* 7.4*  PHOS  --   --  1.4*   Sepsis Markers No results found for this basename: LATICACIDVEN, PROCALCITON, O2SATVEN,  in the last 168 hours  ABG  Recent Labs Lab 11-25-2013 2139 11/03/13 0410  PHART 7.480* 7.353  PCO2ART 34.1* 35.9  PO2ART 267.0* 107.0*   Liver Enzymes  Recent Labs Lab 11/05/13 0314 11/06/13 0430 11/07/13 0330 11/12/2013 0355  AST 10 16 30   --   ALT 6 7 8   --   ALKPHOS 178* 199* 242*  --   BILITOT 0.2* 0.4 0.5  --   ALBUMIN 0.7* 1.4* 1.1* 0.9*   Cardiac Enzymes No results found for this basename: TROPONINI,  PROBNP,  in the last 168 hours Glucose  Recent Labs Lab 11/07/13 1133 11/07/13 1627 11/07/13 1934 November 21, 2013 0011 November 21, 2013 0348 21-Nov-2013 0744  GLUCAP 97 110* 136* 111* 120* 165*   IMAGING: Ct Abdomen Pelvis W Contrast  11/07/2013   CLINICAL DATA:  Sepsis. Shortness of breath. Prior right hemicolectomy for obstruction. Colon cancer.  EXAM: CT ABDOMEN AND PELVIS WITH CONTRAST  TECHNIQUE: Multidetector CT imaging of the abdomen and pelvis was performed using the standard protocol following bolus administration of intravenous contrast.  CONTRAST:  36mL OMNIPAQUE IOHEXOL 300 MG/ML  SOLN  COMPARISON:  CT ABD/PELV WO CM dated 10/06/2013; CT ABD/PELV WO CM dated 09/25/2013; MR OUTSIDE FILMS SPINE dated 09/08/2013; CT L SPINE W/O CM dated 09/09/2013; DG CHEST 1V PORT dated 11/07/2013  FINDINGS: Large  left and small right pleural effusions with passive atelectasis. Extensively increased bilateral airspace opacities prior CABG noted with atherosclerotic calcification of the coronary arteries.  Prior extraluminal gas in the abdomen has resolved. There continues to be considerable perihepatic ascites and pockets of ascites elsewhere in the abdomen including the left upper quadrant. These pockets may well be loculated.  Atrophic pancreas. Atrophic kidneys with scattered renal cysts. Mild hypodensity in the inferior spleen on images 23 through 24 of series 3 may reflect a splenic infarct or less likely a small splenic mass.  No compelling findings of metastatic disease to the liver. 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  Scattered mesenteric edema.  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 No discrete paraspinal or definite epidural abscess at the level of the fracture to suggest superinfection, although MRI might provide greater diagnostic sensitivity.  No pathologic pelvic adenopathy is observed. Bilateral total hip prostheses noted. These cause streak artifact in the lower pelvis which obscures the urinary bladder and adjacent structures.  Right-sided ostomy noted. Midline wound healing by secondary intention, wound VAC in place. Diffuse subcutaneous edema.  Levoconvex thoracolumbar scoliosis. A fairly long stapled-off segment of the transverse colon, descending colon, sigmoid colon and rectum remain in place.  IMPRESSION: 1. Interval resolution of pneumoperitoneum. Compared to prior CT scan, there are new bilateral airspace opacities. Pneumonia as a possible cause of sepsis is raised although given the third spacing in the subcutaneous tissues and abdomen, this may simply reflect pulmonary edema. 2. Large left and small right pleural effusions. 3.  Considerable perihepatic ascites with pockets of loculated ascites in the left upper quadrant. 4. Atrophic kidneys with small renal cysts. 5. Abnormal hypodensity in the spleen if inferiorly is technically nonspecific but could represent a small splenic infarct. 6. 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 Superinfection of the chance fracture is not entirely excluded although no compelling secondary sign such is paraspinal/psoas abscess or epidural abscess identified.   Electronically Signed   By: Sherryl Barters M.D.   On: 11/07/2013 16:00   Dg Chest Port 1 View  11/07/2013   CLINICAL DATA:  Pulmonary edema .  EXAM: PORTABLE CHEST - 1 VIEW  COMPARISON:  DG CHEST 1V PORT dated 11/06/2013; DG CHEST 1V PORT dated 11/04/2013  FINDINGS: Bilateral jugular central lines in stable position. Prior CABG. Heart size normal. Bilateral prominent pulmonary infiltrates most consistent with pneumonia noted. Infiltrate right upper lobe is increased. Left-sided pleural effusion. This is stable. No pneumothorax. Mild gastric distention. Degenerative changes both  shoulders.  IMPRESSION: 1. Stable positioning of IJ lines. 2. Severe bilateral pulmonary infiltrates with progression of right upper lobe infiltrate. These changes are most likely related to pneumonia. Pulmonary edema cannot be completely excluded. 3. Left-sided pleural effusion. 4. Prior CABG.  Heart size and pulmonary vascularity normal.   Electronically Signed   By: Marcello Moores  Register   On: 11/07/2013 08:18   ASSESSMENT / PLAN:  PULMONARY A:  Acute respiratory failure in setting of sepsis / abdominal surgery Bilateral airspace disease / pleural effusions in setting of fluid overload Possible HCAP Do NOT intubate P:   Supplemental oxygen Pulmonary hygiene / IS / Flutter valve  CARDIOVASCULAR A:  Goal MAP 50-55 Sepsis, likely intraabdominal  or pulmonary source Chronic hypotension AF / RVR, resolved Sinus tachycardia P:  Do NOT resuscitate Neo-Synephrine Midodrine Amiodarone  RENAL A:   ESRD on HD P:   Trend BMP CVVHD per Renal  GASTROINTESTINAL A:   Right hemicolectomy with ileostomy Abd wall dehiscence Nutrition GI Px P:   NPO Protonix  HEMATOLOGIC A:   Chronic anemia  VTE Px P:  Trend CBC SCDs  INFECTIOUS A:   Probable intraabdominal sepsis vs HCAP P:   Abx / cx as above  ENDOCRINE A:   DM  Presumed adrenal insufficiency P:   SSI Stress dose steroids  NEUROLOGIC A:   Post-op pain Mild delirium   P:   Supportive care  Fentanyl PRN  Discuss clinical course / present condition with brother.  He will share with the rest of the family.  Will not escalate care.  Possibly withdrawal of life sustaining measures later.  I have personally obtained history, examined patient, evaluated and interpreted laboratory and imaging results, reviewed medical records, formulated assessment / plan and placed orders.  CRITICAL CARE:  The patient is critically ill with multiple organ systems failure and requires high complexity decision making for assessment and support, frequent evaluation and titration of therapies, application of advanced monitoring technologies and extensive interpretation of multiple databases. Critical Care Time devoted to patient care services described in this note is 35 minutes.   Doree Fudge, MD [facility] [place] Pager: [phone]  10/23/2013, 9:25 AM

## 2013-11-16 NOTE — Progress Notes (Signed)
PT Discharge Note  Patient Details Name: ROBBIE RIDEAUX MRN: [id number] DOB: [birth year]   Cancelled Treatment:    Reason Eval/Treat Not Completed: Medical issues which prohibited therapy;Other (comment) (Movement by family agreement tow comfort care only.) Acute PT services will sign off at this time. 08-Dec-2013  Donnella Sham, [place] [phone]  (pager)2013/12/08  Donnella Sham, PT [phone]  (pager)   Ayven Pheasant, Tessie Fass December 08, 2013, 12:35 PM

## 2013-11-16 NOTE — Progress Notes (Signed)
Pt Temp 95.9, blood warmer is on at 41 degree, added  bair hugger.

## 2013-11-16 NOTE — Progress Notes (Signed)
6 Days Post-Op  Subjective: Hypotensive to the 60's maxed out on Neo, Temp 95 - up to 97 with bair hugger.  Very sleepy, minimally arousable.  Will open eyes, not able to follow commands or answer questions.  Objective: Vital signs in last 24 hours: Temp:  [95.8 F (35.4 C)-98 F (36.7 C)] 95.9 F (35.5 C) (03/24 0600) Pulse Rate:  [26-135] 29 (03/23 1930) Resp:  [11-28] 13 (03/24 0700) BP: (54-110)/(40-81) 60/42 mmHg (03/24 0700) SpO2:  [90 %-100 %] 99 % (03/24 0700) FiO2 (%):  [80 %-100 %] 100 % (03/24 0700) Weight:  [153 lb 3.5 oz (69.5 kg)] 153 lb 3.5 oz (69.5 kg) (03/24 0500) Last BM Date: 11/05/13  Intake/Output from previous day: 03/23 0701 - 03/24 0700 In: 3212.8 [P.O.:650; I.V.:2112.8; IV Piggyback:450] Out: 3187 [Drains:150; Stool:50] Intake/Output this shift:    PE: Gen:  Sleepy, NAD, appears week and pale Card:  Tachycardic, hypotensive, no M/G/R heard Pulm:  Course breath sounds, no ralls Abd: Soft, NT/ND, few BS, no HSM, wound vac in place along midline with sanguinous drainage, RLQ ileostomy with dark paste like stool Ext:  No erythema, or tenderness; dopplerable DP pulses b/l but not palpated  Lab Results:   Recent Labs  11/06/13 0430 11/07/13 0330  WBC 34.3* 35.4*  HGB 9.4* 10.6*  HCT 29.6* 32.9*  PLT 242 238   BMET  Recent Labs  11/07/13 0330 11/04/2013 0355  NA 139 136*  K 4.2 4.2  CL 101 98  CO2 24 24  GLUCOSE 135* 146*  BUN 12 9  CREATININE 1.33 0.96  CALCIUM 7.6* 7.4*   PT/INR No results found for this basename: LABPROT, INR,  in the last 72 hours CMP     Component Value Date/Time   NA 136* 11/15/2013 0355   K 4.2 11/06/2013 0355   CL 98 11/09/2013 0355   CO2 24 10/20/2013 0355   GLUCOSE 146* 10/21/2013 0355   BUN 9 11/07/2013 0355   CREATININE 0.96 11/15/2013 0355   CALCIUM 7.4* 11/06/2013 0355   PROT 4.6* 11/07/2013 0330   ALBUMIN 0.9* 10/24/2013 0355   AST 30 11/07/2013 0330   ALT 8 11/07/2013 0330   ALKPHOS 242* 11/07/2013 0330    BILITOT 0.5 11/07/2013 0330   GFRNONAA 82* 10/28/2013 0355   GFRAA >90 11/15/2013 0355   Lipase     Component Value Date/Time   LIPASE 29 10/22/2012 0900       Studies/Results: Ct Abdomen Pelvis W Contrast  11/07/2013   CLINICAL DATA:  Sepsis. Shortness of breath. Prior right hemicolectomy for obstruction. Colon cancer.  EXAM: CT ABDOMEN AND PELVIS WITH CONTRAST  TECHNIQUE: Multidetector CT imaging of the abdomen and pelvis was performed using the standard protocol following bolus administration of intravenous contrast.  CONTRAST:  78mL OMNIPAQUE IOHEXOL 300 MG/ML  SOLN  COMPARISON:  CT ABD/PELV WO CM dated 10/06/2013; CT ABD/PELV WO CM dated 09/25/2013; MR OUTSIDE FILMS SPINE dated 09/08/2013; CT L SPINE W/O CM dated 09/09/2013; DG CHEST 1V PORT dated 11/07/2013  FINDINGS: Large left and small right pleural effusions with passive atelectasis. Extensively increased bilateral airspace opacities prior CABG noted with atherosclerotic calcification of the coronary arteries.  Prior extraluminal gas in the abdomen has resolved. There continues to be considerable perihepatic ascites and pockets of ascites elsewhere in the abdomen including the left upper quadrant. These pockets may well be loculated.  Atrophic pancreas. Atrophic kidneys with scattered renal cysts. Mild hypodensity in the inferior spleen on images 23 through 24  of series 3 may reflect a splenic infarct or less likely a small splenic mass.  No compelling findings of metastatic disease to the liver. 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  Scattered mesenteric edema.  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 No discrete paraspinal or definite epidural abscess at the level of the fracture to suggest superinfection, although MRI might provide greater diagnostic sensitivity.  No pathologic pelvic  adenopathy is observed. Bilateral total hip prostheses noted. These cause streak artifact in the lower pelvis which obscures the urinary bladder and adjacent structures.  Right-sided ostomy noted. Midline wound healing by secondary intention, wound VAC in place. Diffuse subcutaneous edema.  Levoconvex thoracolumbar scoliosis. A fairly long stapled-off segment of the transverse colon, descending colon, sigmoid colon and rectum remain in place.  IMPRESSION: 1. Interval resolution of pneumoperitoneum. Compared to prior CT scan, there are new bilateral airspace opacities. Pneumonia as a possible cause of sepsis is raised although given the third spacing in the subcutaneous tissues and abdomen, this may simply reflect pulmonary edema. 2. Large left and small right pleural effusions. 3. Considerable perihepatic ascites with pockets of loculated ascites in the left upper quadrant. 4. Atrophic kidneys with small renal cysts. 5. Abnormal hypodensity in the spleen if inferiorly is technically nonspecific but could represent a small splenic infarct. 6. 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 Superinfection of the chance fracture is not entirely excluded although no compelling secondary sign such is paraspinal/psoas abscess or epidural abscess identified.   Electronically Signed   By: Sherryl Barters M.D.   On: 11/07/2013 16:00   Dg Chest Port 1 View  11/07/2013   CLINICAL DATA:  Pulmonary edema .  EXAM: PORTABLE CHEST - 1 VIEW  COMPARISON:  DG CHEST 1V PORT dated 11/06/2013; DG CHEST 1V PORT dated 11/04/2013  FINDINGS: Bilateral jugular central lines in stable position. Prior CABG. Heart size normal. Bilateral prominent pulmonary infiltrates most consistent with pneumonia noted. Infiltrate right upper lobe is increased. Left-sided pleural effusion. This is stable. No pneumothorax. Mild gastric distention.  Degenerative changes both shoulders.  IMPRESSION: 1. Stable positioning of IJ lines. 2. Severe bilateral pulmonary infiltrates with progression of right upper lobe infiltrate. These changes are most likely related to pneumonia. Pulmonary edema cannot be completely excluded. 3. Left-sided pleural effusion. 4. Prior CABG.  Heart size and pulmonary vascularity normal.   Electronically Signed   By: Marcello Moores  Register   On: 11/07/2013 08:18    Anti-infectives: Anti-infectives   Start     Dose/Rate Route Frequency Ordered Stop   11/07/13 1200  piperacillin-tazobactam (ZOSYN) IVPB 3.375 g     3.375 g 100 mL/hr over 30 Minutes Intravenous 4 times per day 11/07/13 1020     11/07/13 1200  vancomycin (VANCOCIN) IVPB 750 mg/150 ml premix     750 mg 150 mL/hr over 60 Minutes Intravenous Every 24 hours 11/07/13 1020     11/07/13 1030  micafungin (MYCAMINE) 100 mg in sodium chloride 0.9 % 100 mL IVPB     100 mg 100 mL/hr over 1 Hours Intravenous Daily 11/07/13 0913     11/06/13 1200  vancomycin (VANCOCIN) 1,500 mg in sodium chloride 0.9 % 250 mL IVPB     1,500 mg 125 mL/hr over 120 Minutes Intravenous  Once 11/06/13 1146 11/06/13 1506   11/06/13  1100  vancomycin (VANCOCIN) 1,500 mg in sodium chloride 0.9 % 500 mL IVPB  Status:  Discontinued     1,500 mg 250 mL/hr over 120 Minutes Intravenous  Once 11/06/13 1036 11/06/13 1147   11/06/13 1100  piperacillin-tazobactam (ZOSYN) IVPB 2.25 g  Status:  Discontinued     2.25 g 100 mL/hr over 30 Minutes Intravenous 3 times per day 11/06/13 1036 11/07/13 1019   11/03/13 0700  cefOXitin (MEFOXIN) 2 g in dextrose 5 % 50 mL IVPB  Status:  Discontinued     2 g 100 mL/hr over 30 Minutes Intravenous Every 12 hours 10/22/2013 2043 11/06/13 0948   11/12/2013 1845  cefOXitin (MEFOXIN) 1 g in dextrose 5 % 50 mL IVPB  Status:  Discontinued     1 g 100 mL/hr over 30 Minutes Intravenous 4 times per day 11/09/2013 1835 10/16/2013 2043       Assessment/Plan POD #6 S/P Ex lap, LOA,  closure of abdominal wall dehiscence, ileostomy revision, placement of abdominal wound VAC (Dr. Georgette Dover (11/03/2013) S/P Right colectomy, repair of descending and sigmoid serosal tear, ileostomy (Dr. 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

## 2013-11-16 NOTE — Progress Notes (Signed)
ANTIBIOTIC CONSULT NOTE - Follow-up  Pharmacy Consult for Vancomycin and Zosyn Indication: rule out sepsis  Allergies  Allergen Reactions  . Statins Other (See Comments)    Muscle weakness     Patient Measurements: Height: 5\' 6"  (167.6 cm) Weight: 153 lb 3.5 oz (69.5 kg) IBW/kg (Calculated) : 63.8 Adjusted Body Weight:   Vital Signs: Temp: 95.9 F (35.5 C) (03/24 0600) Temp src: Axillary (03/24 0600) BP: 60/42 mmHg (03/24 0700) Intake/Output from previous day: 03/23 0701 - 03/24 0700 In: 3212.8 [P.O.:650; I.V.:2112.8; IV Piggyback:450] Out: 3187 [Drains:150; Stool:50] Intake/Output from this shift:    Labs:  Recent Labs  11/06/13 0430 11/07/13 0330 2013/11/13 0355  WBC 34.3* 35.4*  --   HGB 9.4* 10.6*  --   PLT 242 238  --   CREATININE 1.74* 1.33 0.96   Estimated Creatinine Clearance: 64.6 ml/min (by C-G formula based on Cr of 0.96). No results found for this basename: VANCOTROUGH, Corlis Leak, VANCORANDOM, [place], GENTPEAK, GENTRANDOM, TOBRATROUGH, TOBRAPEAK, TOBRARND, AMIKACINPEAK, AMIKACINTROU, AMIKACIN,  in the last 72 hours   Microbiology: Recent Results (from the past 720 hour(s))  CLOSTRIDIUM DIFFICILE BY PCR     Status: None   Collection Time    10/11/13  9:33 AM      Result Value Ref Range Status   C difficile by pcr NEGATIVE  NEGATIVE Final  MRSA PCR SCREENING     Status: None   Collection Time    11/09/2013  8:55 PM      Result Value Ref Range Status   MRSA by PCR NEGATIVE  NEGATIVE Final   Comment:            The GeneXpert MRSA Assay (FDA     approved for NASAL specimens     only), is one component of a     comprehensive MRSA colonization     surveillance program. It is not     intended to diagnose MRSA     infection nor to guide or     monitor treatment for     MRSA infections.    Assessment: 73yo male with history of ESRD on HD initiated on vancomycin and zosyn for possible sepsis. Pt is afebrile but WBC remains significantly elevated  at 35.4 as of 3/23. Pt has not been tolerating HD so was switched to CVVHDF but now he is no longer tolerating that either. No CRRT or IHD ordered at this time. Micafungin was also addedfor anti-fungal coverage. No culture data available.   Cefoxitin 3/18 >> 3/22 Zosyn 3/22 >> Vanco 3/22 >> Mica 3/23>>  Goal of Therapy:  Vancomycin trough level 15-20 mcg/ml  Plan:  1. Change zosyn to 2.25gm IV Q8H 2. Hold vanc - check a random level today and re-dose based on level 3. F/u renal plans, C&S, clinical status  Salome Arnt, PharmD, BCPS Pager # [phone] 11/13/13 10:06 AM

## 2013-11-16 NOTE — Progress Notes (Signed)
Chaplain entered pt room and observed 9 or 10 people at bedside.  Chaplain gave support through conversation and empathic listening.  Chaplain invited all present to notify (through 2S staff) if chaplain services would be further helpful.     2013-12-04 1500  Clinical Encounter Type  Visited With Patient and family together  Visit Type Spiritual support;Critical Care  Referral From Nurse  Spiritual Encounters  Spiritual Needs Emotional   Estelle June, chaplain pager [phone]

## 2013-11-16 NOTE — Discharge Summary (Signed)
Physician Discharge Summary  Patient ID: Nicholas [name] MRN: [id number] DOB/AGE: 12/10/1940 73 y.o.  Admit date: 27-Nov-2013 Discharge date: 11/16/2013  Admission Diagnoses: dehiscence of abdominal wound  Discharge Diagnoses: death after withdrawal of aggressive care Active Problems:   Perioperative dehiscence of abdominal wound with evisceration   Acute respiratory failure   Hypotension   DNR (do not resuscitate)   Sepsis(995.91)   Discharged Condition: Death  Hospital Course: Patient has multiple medical problems including significant coronary artery disease, congestive heart failure, end-stage renal disease currently on dialysis, and ischemic cardiomyopathy. He underwent right colectomy for a right colon cancer which was perforated during endoscopy on 09/29/2013. He progressed to discharge to skilled nursing. He had a wound VAC on his midline wound. This was removed in our office on March 18. Late that day, he developed a dehiscence of his abdominal wound with protrusion of small bowel. He was brought to the hospital and taken to surgery emergently. Postoperatively, he required continuous renal replacement therapy. Pulmonary and critical care medicine assisted in management of his complex medical problems. Renal service consulted. He was able to be weaned from the ventilator over a couple days. He began taking clear liquids but his mental status gradually declined. Long conversations were held with his brother. He felt in light of his multiple complex medical issues and frailty the patient's wishes would not be to be reintubated. As his mental status declined, decision was made by the brother to stop CR RT. Comfort measures were applied and he passed uneventfully.  Consults: Renal, PCCM  Significant Diagnostic Studies: CXR  Treatments: surgery above  Discharge Exam: Blood pressure 55/33, pulse 122, temperature 95.9 F (35.5 C), temperature source Axillary, resp. rate 20, height 5\' 6"   (1.676 m), weight 153 lb 3.5 oz (69.5 kg), SpO2 62.00%. see progress notes day of death  Disposition: 20-Expired    Signed: Ambrose Wile E 11/16/2013, 8:03 AM

## 2013-11-16 NOTE — Progress Notes (Signed)
Extensive discussion with family. We discussed the poor prognosis and likely poor quality of life. Family has decided to offer full comfort care. They are aware that the patient may be transferred to palliative care floor for continued comfort care needs. They have been fully updated on the process and expectations.  Pavlos Yon, MD Pulmonary and Critical Care Medicine [facility] Pager: [phone]  

## 2013-11-16 NOTE — Progress Notes (Signed)
Hypotensive despite max neo. CRRT being stopped by CCM. Agree with moving toward comfort care and I spoke with his brother regarding this. Patient examined and I agree with the assessment and plan  Georganna Skeans, MD, MPH, FACS Trauma: [phone] General Surgery: [phone]  10/19/2013 10:07 AM

## 2013-11-16 DEATH — deceased

## 2014-01-31 ENCOUNTER — Other Ambulatory Visit: Payer: Medicare Other

## 2014-01-31 ENCOUNTER — Ambulatory Visit (HOSPITAL_COMMUNITY): Payer: Medicare Other

## 2014-02-02 ENCOUNTER — Ambulatory Visit: Payer: Medicare Other | Admitting: Oncology

## 2014-06-14 IMAGING — CR DG ABD PORTABLE 1V
2 series · 2 of 2 positions shown · non-contrast
Comparison: 09/29/2013 and 09/28/2013

CLINICAL DATA: Distension.  Recent hemicolectomy with colostomy.

EXAM:
PORTABLE ABDOMEN - 1 VIEW

[AP (1 of 2)]
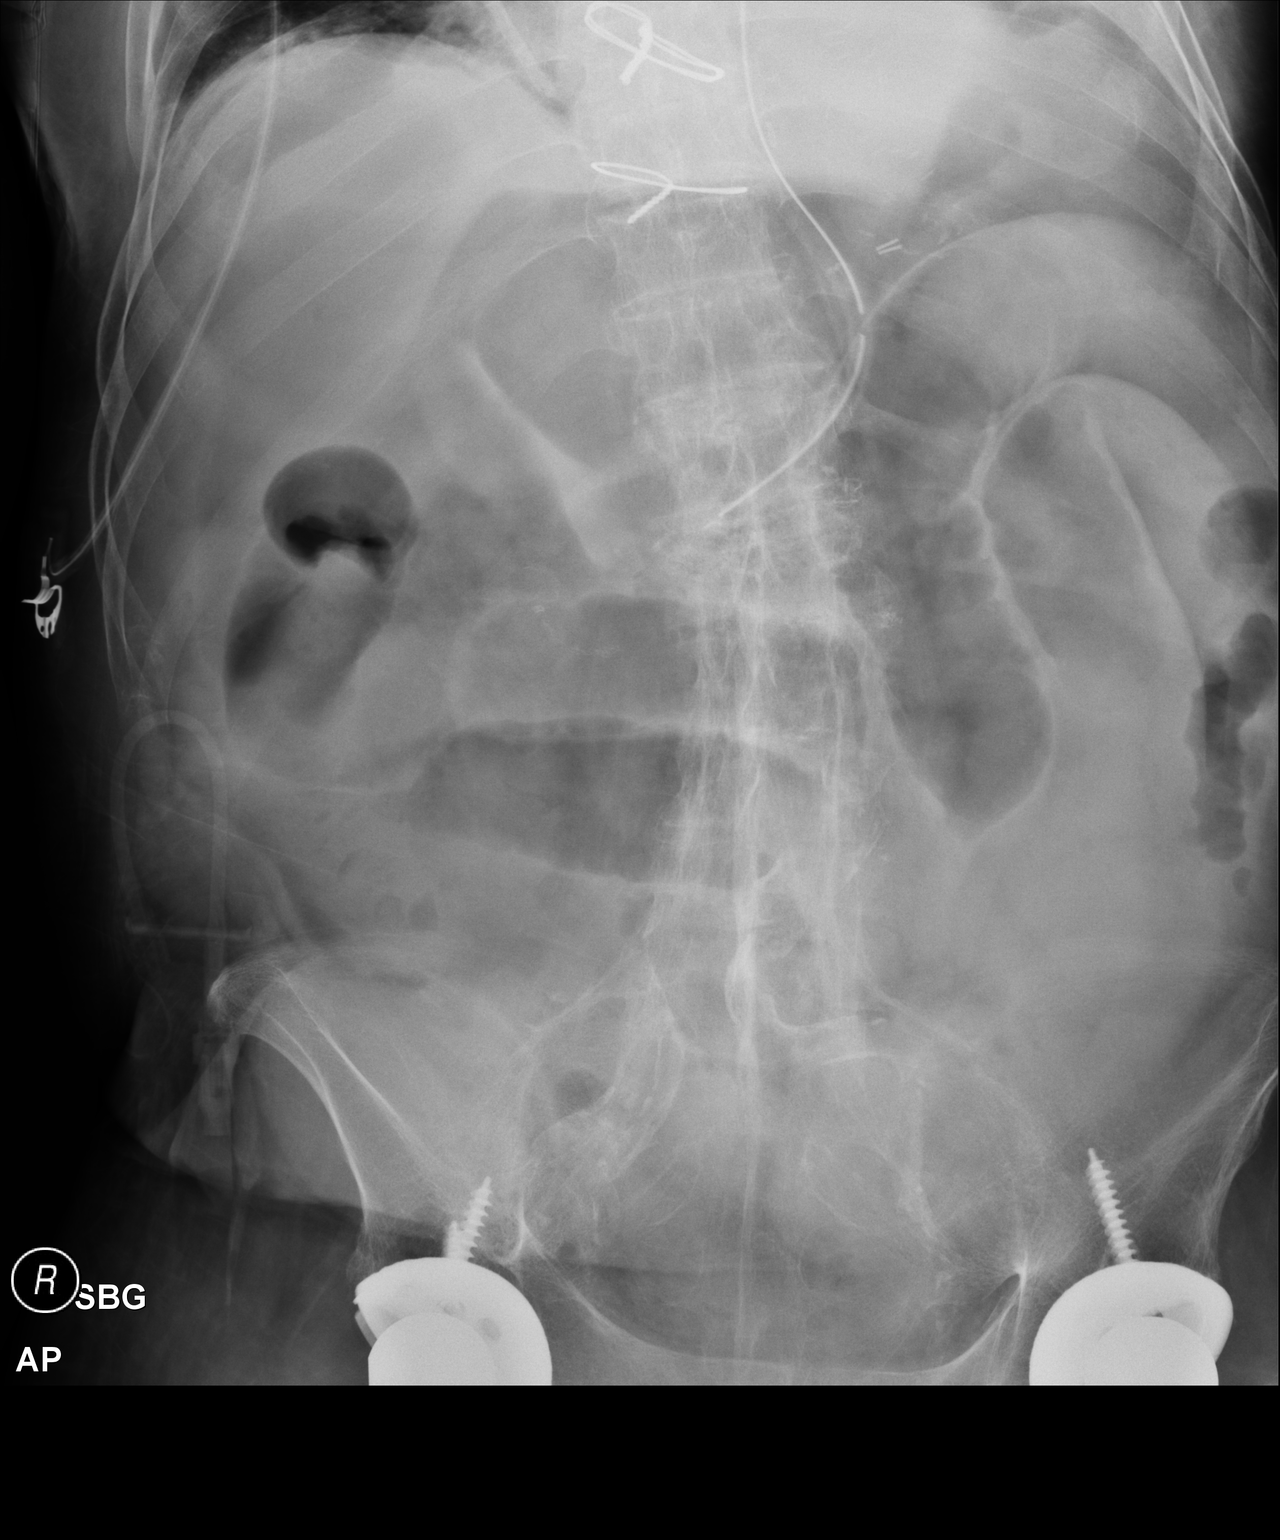

[AP (2 of 2)]
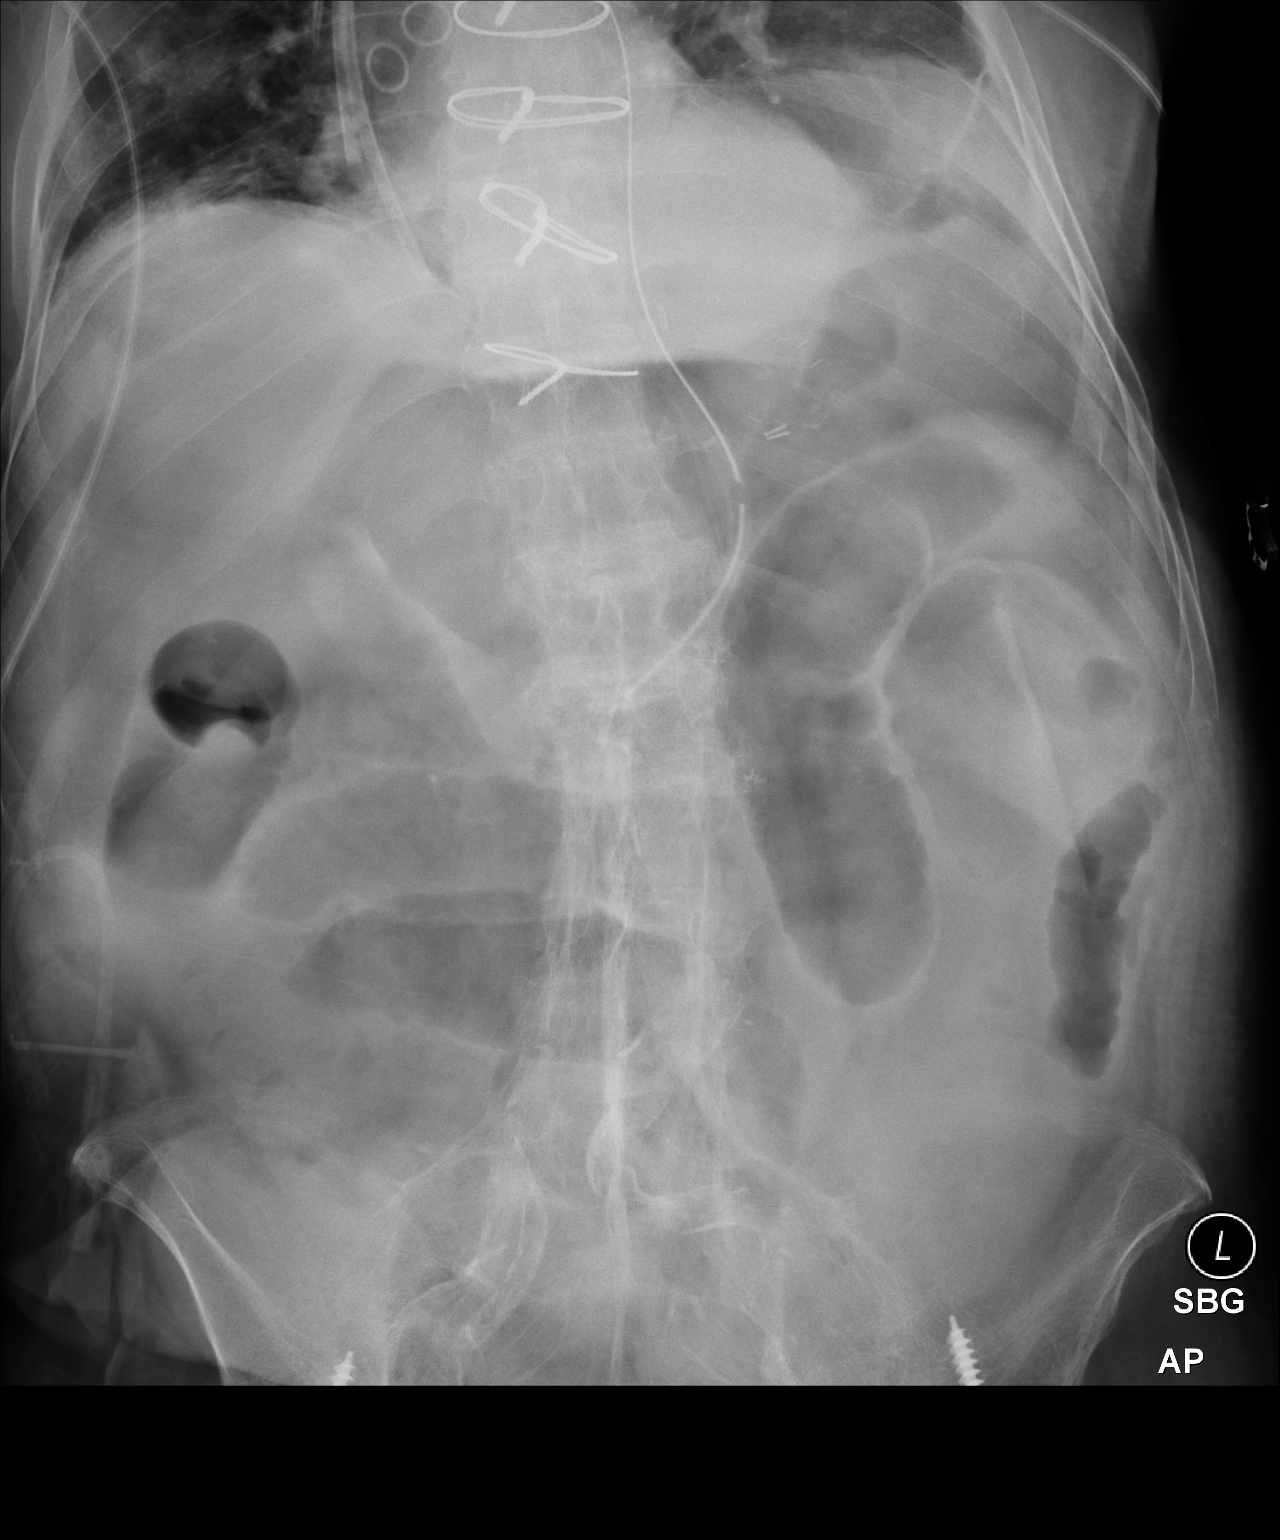

[2 of 2 positions shown; findings below may reference images not displayed]

FINDINGS: Central venous catheter unchanged with tip over the right atrium.
Nasogastric tube is present with tip over the midline upper abdomen
likely over the distal stomach. There is continued evidence of
multiple air-filled dilated small bowel loops. There is minimal air
within the colon. There is continued evidence of an unusual air
collection in the right upper quadrant as cannot exclude free
peritoneal air as seen on the prior study. Remainder of the exam is
unchanged.
IMPRESSION: Persistent air-filled dilated small bowel loops with paucity of
colonic gas. Findings suggesting small bowel obstructive process.
Persistent unusual air collection over the right upper quadrant as
this may represent free peritoneal air as seen previously likely due
to patient's recent hemicolectomy.

Nasogastric tube with tip over the mid abdomen likely in the distal
stomach.

Findings discussed with patient's nurse, Gavai, at the time of
dictation.

## 2014-06-19 IMAGING — CT CT ABD-PELV W/O CM
2 of 4 series · 16 of 46 positions shown, 18 images · non-contrast
Comparison: CT scan of September 25, 2013.

CLINICAL DATA: Status post colectomy for colon cancer.

EXAM:
CT ABDOMEN AND PELVIS WITHOUT CONTRAST
TECHNIQUE: Multidetector CT imaging of the abdomen and pelvis was performed
following the standard protocol without intravenous contrast.

[Series 2: abd/ pelvis 5.0 i30f 1 · axial · 0.85mm/px · z∈[-420,+0]mm · 13 of 92 slices shown, 15 images]
[im 4/92  soft-tissue]
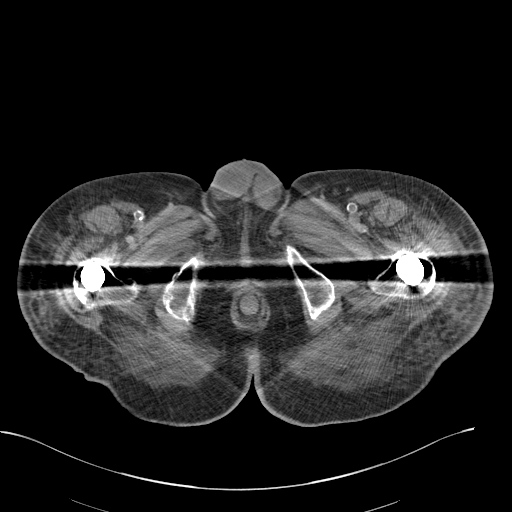
[im 4/92  bone]
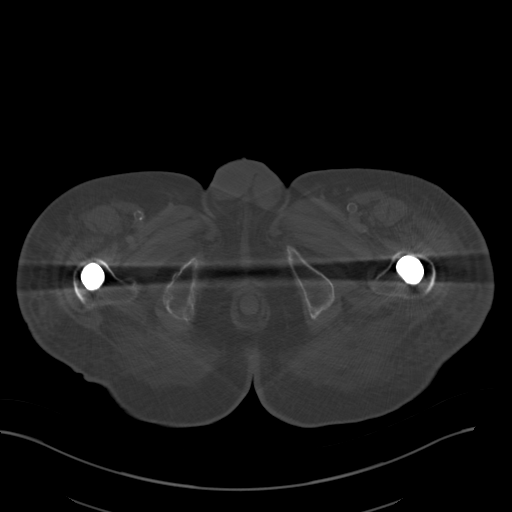
[im 12/92  soft-tissue]
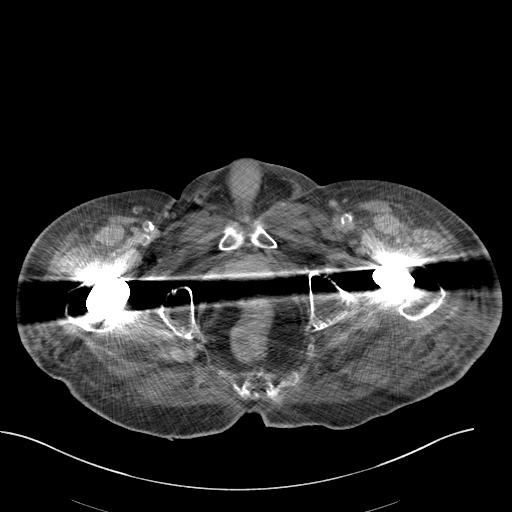
[im 19/92  soft-tissue]
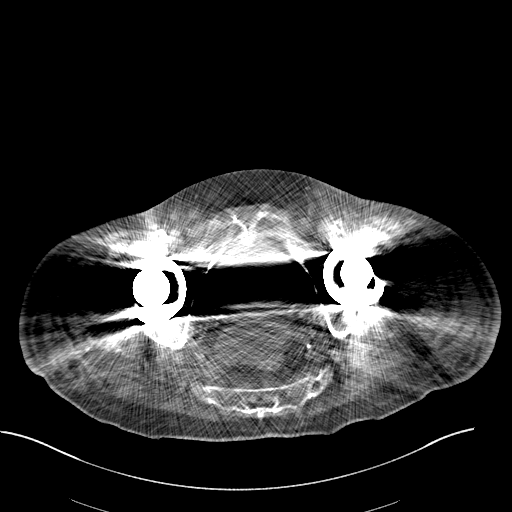
[im 27/92  soft-tissue]
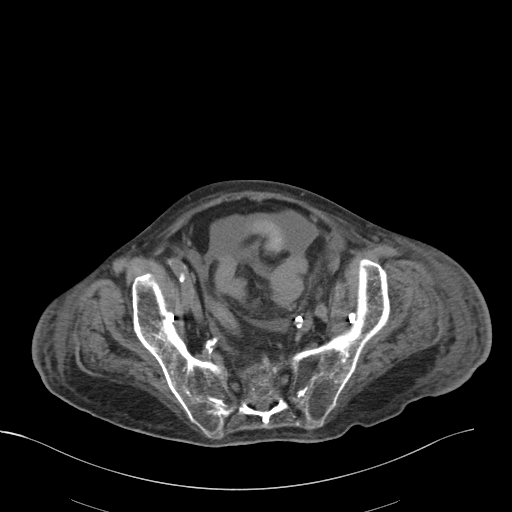
[im 31/92  soft-tissue]
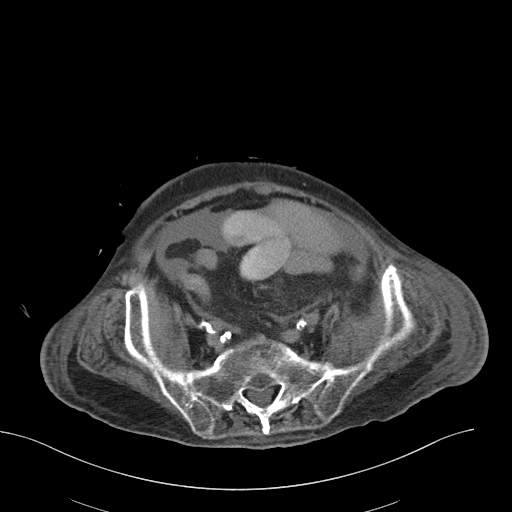
[im 38/92  soft-tissue]
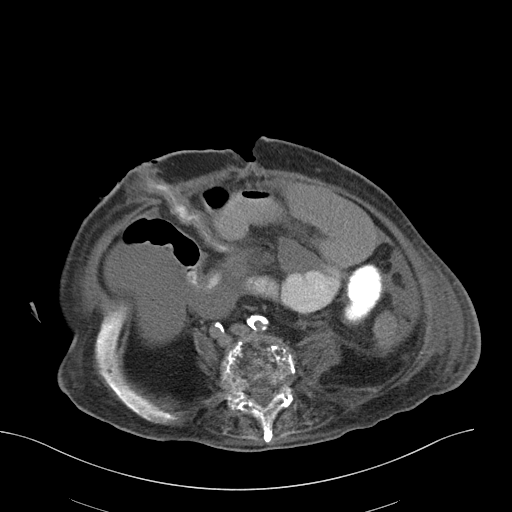
[im 46/92  soft-tissue]
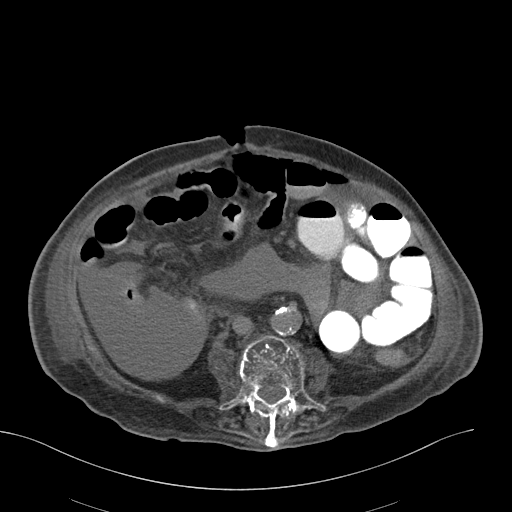
[im 54/92  soft-tissue]
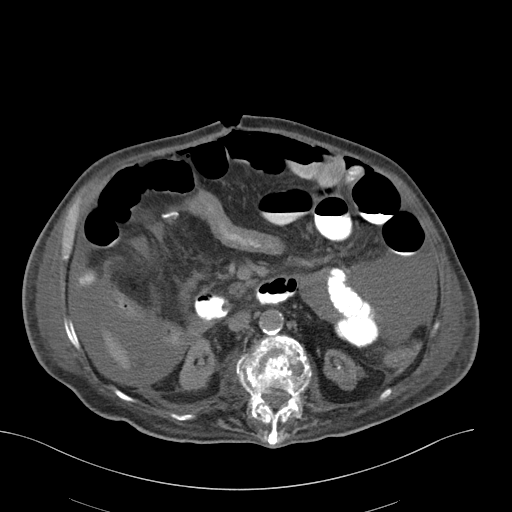
[im 61/92  soft-tissue]
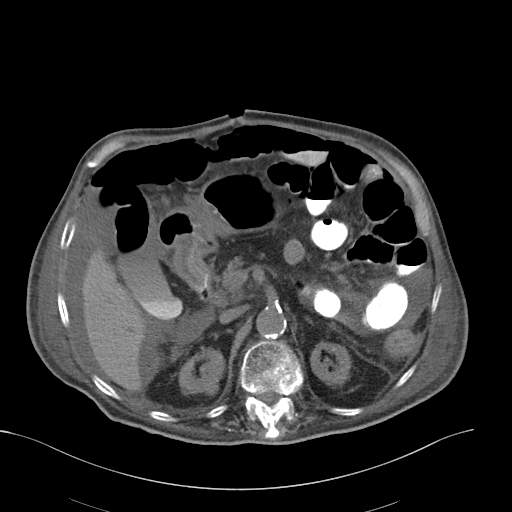
[im 61/92  bone]
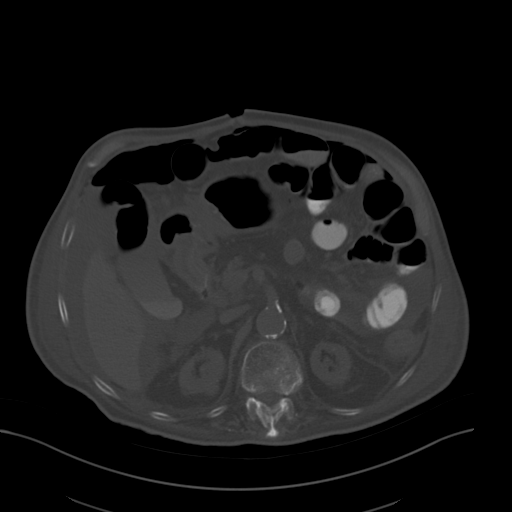
[im 65/92  soft-tissue]
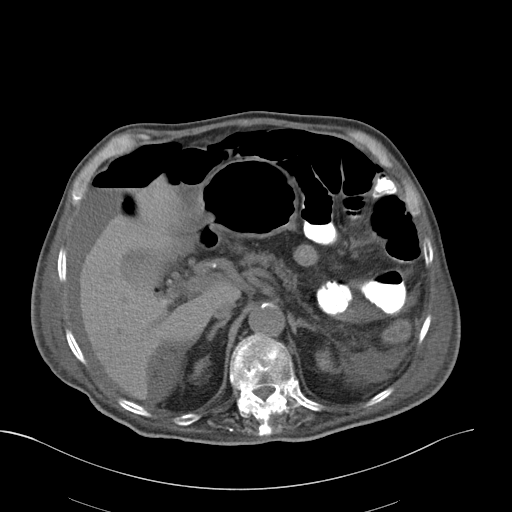
[im 73/92  soft-tissue]
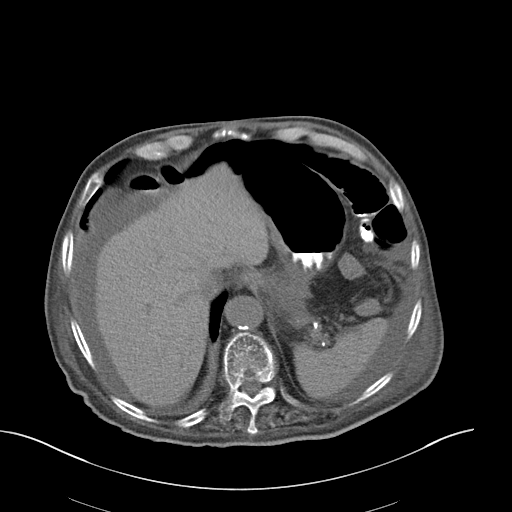
[im 80/92  soft-tissue]
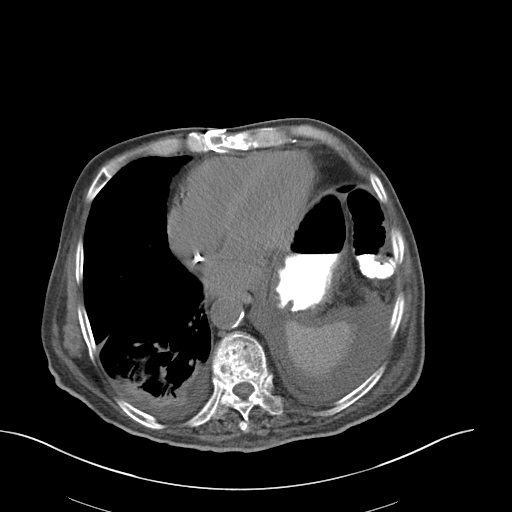
[im 88/92  soft-tissue]
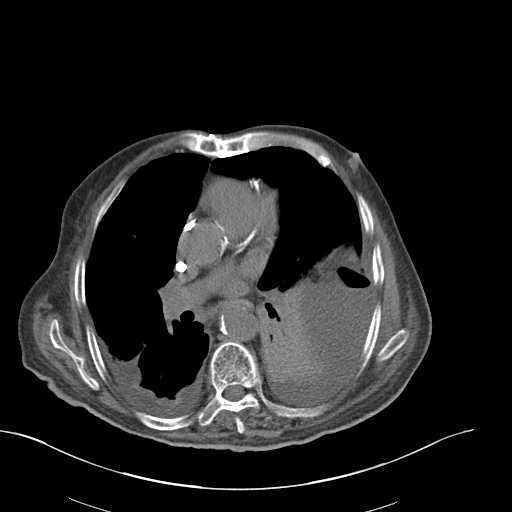

[Series 5: coronals · coronal · 0.86mm/px · 3 of 103 slices shown]
[im 35/103  soft-tissue]
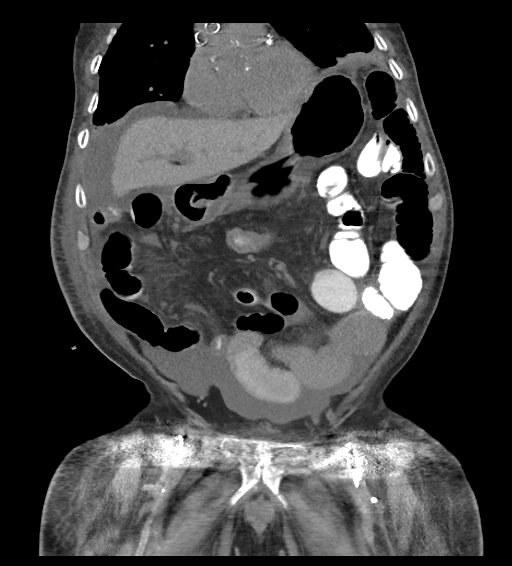
[im 46/103  soft-tissue]
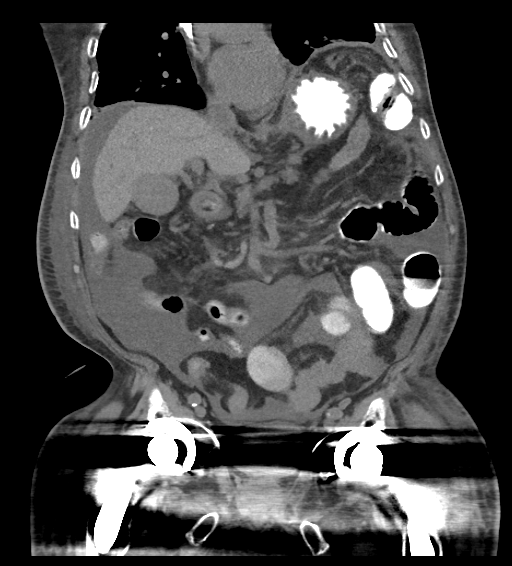
[im 57/103  soft-tissue]
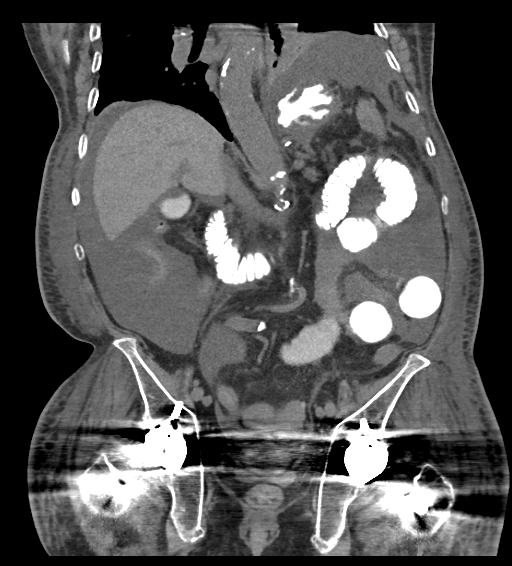

[16 of 46 positions shown; findings below may reference images not displayed]

FINDINGS: L1 compression fracture is again noted. Moderate left pleural
effusion is noted with left lower lobe atelectasis. Status post
bilateral hip arthroplasties. Mild right basilar opacity is noted
concerning for pneumonia or atelectasis with associated pleural
effusion.

No focal abnormality is seen in the liver, spleen or pancreas on
these unenhanced images. Dense sludge is noted in the gallbladder.
Fluid is noted around the liver and spleen, as well as in the pelvis
consistent with ascites. Colostomy is noted in the right lower
quadrant and patient is status post right colectomy. Mild
pneumoperitoneum is noted in the epigastric region. Midline surgical
incision is noted. Small bowel dilatation is noted which may
represent postoperative ileus. Bilateral renal atrophy is noted.
Stable rounded and exophytic hyperdense and hypodense abnormalities
are seen involving both kidneys most likely representing cysts.
Renal ultrasound may be performed for further evaluation. Evaluation
of the pelvis is limited due to scatter artifact from previously
described bilateral hip arthroplasties. Atherosclerotic
calcifications of abdominal aorta are noted without aneurysm
formation. Adrenal glands appear normal.
IMPRESSION: L1 compression fracture is again noted.

Moderate left pleural effusion is noted with left lower lobe
atelectasis. Mild right basilar opacity is noted concerning for
pneumonia or atelectasis.

Mild ascites is noted.

Status post right hemicolectomy with colostomy seen in right lower
quadrant. Mildly dilated small bowel loops are noted most consistent
with postoperative ileus, but distal obstruction cannot be excluded.

Stable rounded and exophytic hyperdense and hypodense abnormalities
are seen involving both kidneys which may represent cysts; renal
ultrasound is recommended for further evaluation.

Bilateral renal atrophy is noted.

Mild pneumoperitoneum is noted in the epigastric region most likely
postoperative in nature.

## 2014-07-18 IMAGING — DX DG CHEST 1V PORT
1 series · 1 of 1 positions shown · non-contrast
Comparison: DG CHEST 1V PORT dated 11/03/2013;

CLINICAL DATA: Exploratory laparotomy

EXAM:
PORTABLE CHEST - 1 VIEW

[portable]
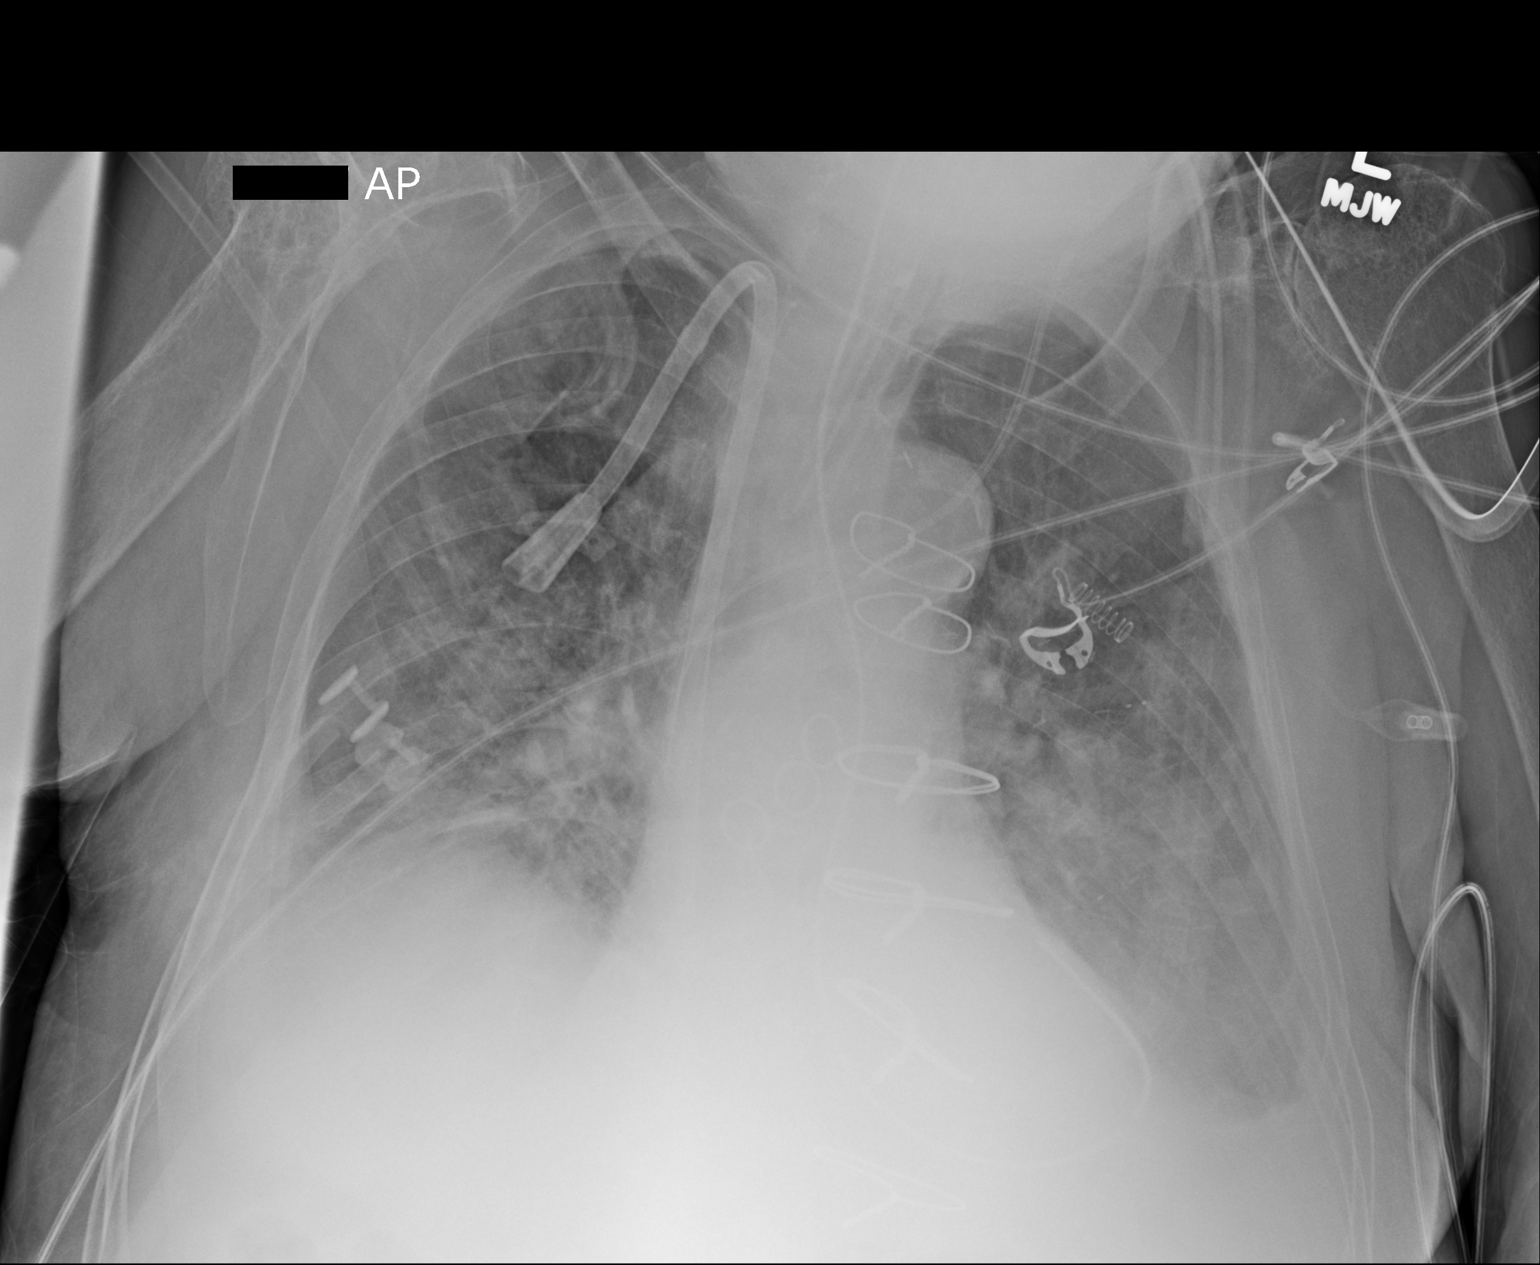

[1 of 1 positions shown; findings below may reference images not displayed]

DG CHEST 1V PORT
dated 10/01/2013; DG CHEST 1V PORT dated 09/30/2013; DG CHEST 2 VIEW
dated 09/09/2013; CT ABD/PELV WO CM dated 10/06/2013; DG CHEST 1V PORT
dated 11/02/2013
FINDINGS: Grossly unchanged cardiac silhouette and mediastinal contours given
patient rotation. Post median sternotomy and CABG. Stable position
of support apparatus. The pulmonary vasculature appears less
distinct on the present examination with cephalization of flow.
Grossly unchanged small bilateral effusions. Worsening bibasilar
opacities. No pneumothorax. Unchanged bones including deformity of
the surgical neck of the right humerus.
IMPRESSION: 1. Enteric tube overlies the left heart border, an interval change
since prior remote examinations and thus is in an indeterminate
location. Correlation for aspiration of gastric fluid is
recommended.
2. Otherwise, stable positioning of support apparatus. No
pneumothorax.
3. Worsening pulmonary edema bibasilar atelectasis.
Critical Value/emergent results were called by telephone at the time
of interpretation on 11/04/2013 at [DATE] to Brehan, RN, who
verbally acknowledged these results.

## 2014-07-20 IMAGING — CR DG CHEST 1V PORT
1 series · 1 of 1 positions shown · non-contrast
Comparison: Portable exam 8182 hr compared to 11/04/2013

CLINICAL DATA: Increased shortness of breath, history hypertension,
diabetes, coronary artery disease post CABG

EXAM:
PORTABLE CHEST - 1 VIEW

[AP]
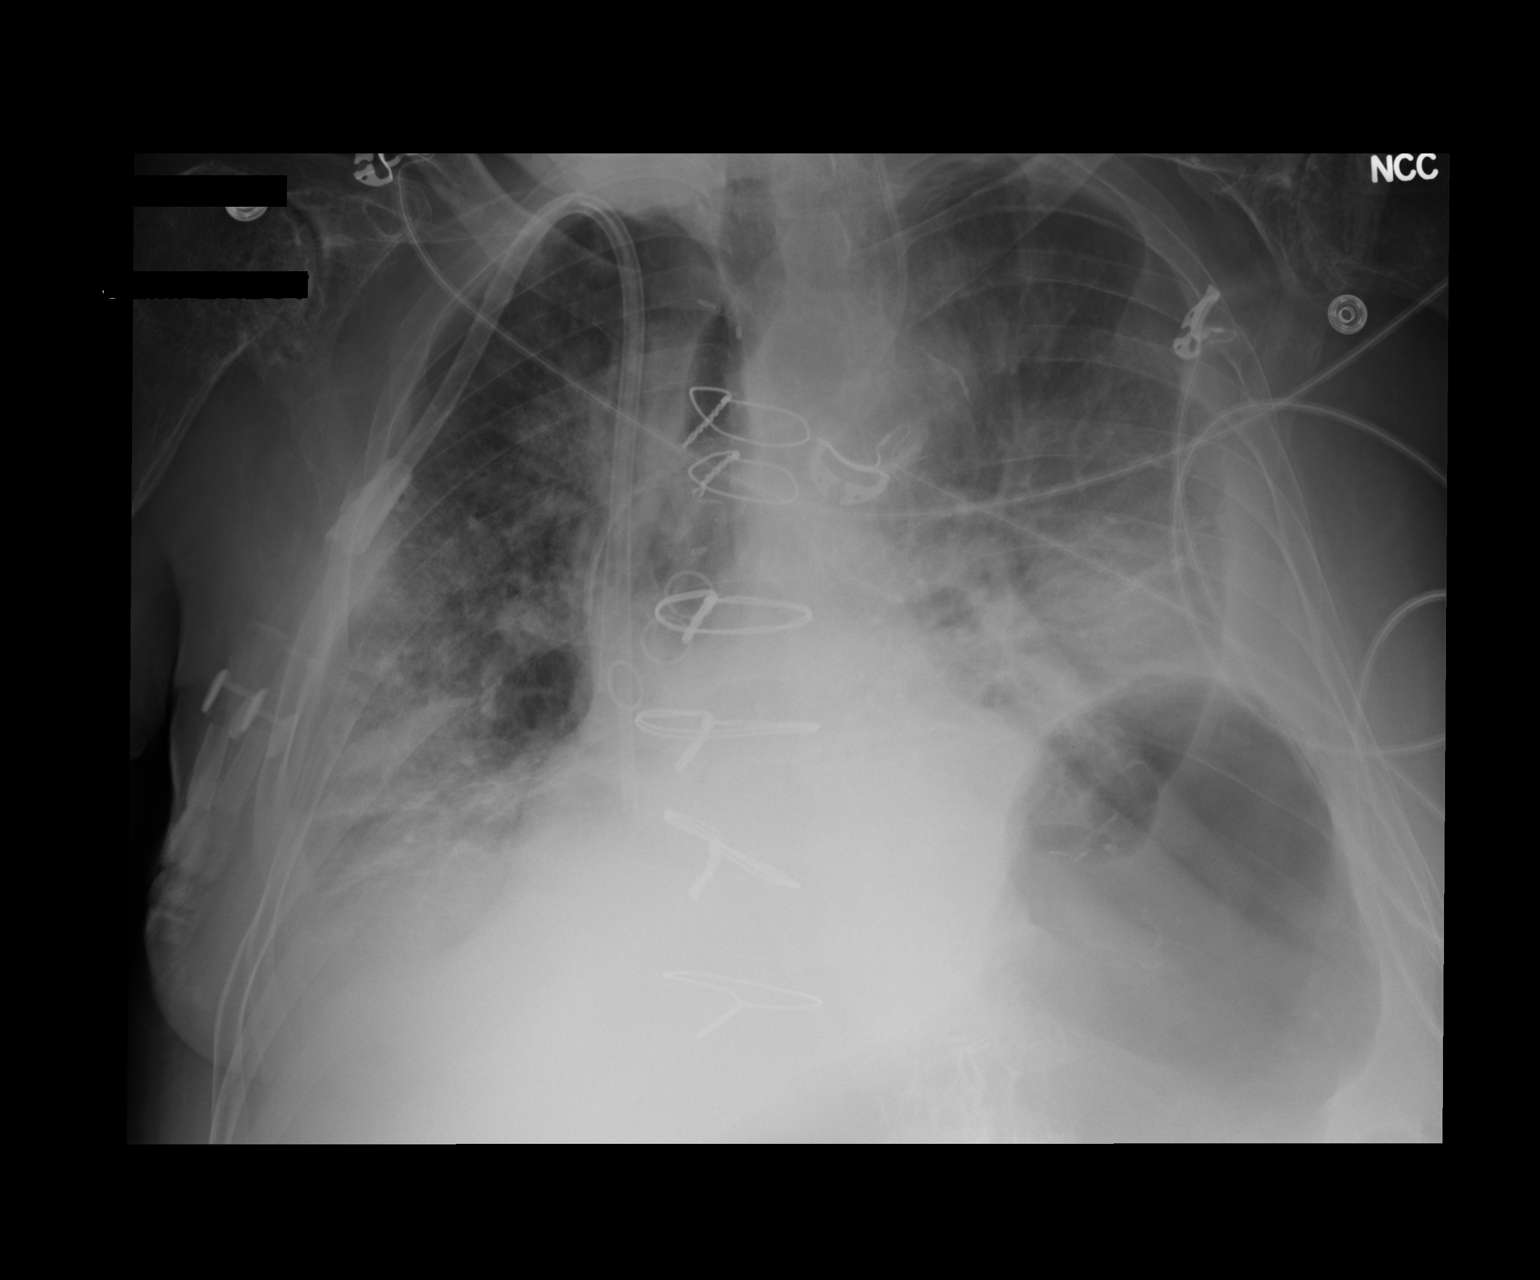

[1 of 1 positions shown; findings below may reference images not displayed]

FINDINGS: Interval removal of nasogastric and endotracheal tubes.

Bilateral jugular line is stable.

Rotated to the right.

Upper normal heart size.

Bilateral pulmonary infiltrates and basilar atelectasis.

Left pleural effusion.

No pneumothorax.

Bones demineralized with bilateral glenohumeral degenerative
changes.
IMPRESSION: Persistent bilateral pulmonary infiltrates, basilar atelectasis and
left pleural effusion.

## 2014-12-08 NOTE — H&P (Signed)
PATIENT NAME:  Nicholas [name], Nicholas [name] MR#:  [id number] DATE OF BIRTH:  10-17-1940  DATE OF ADMISSION:  09/15/2012  Addendum  PLAN:  After discussing the patient's presentation with Dr. Verdie Shire, recommendation was to proceed with an EGD tomorrow to allow direct luminal evaluation of upper GI tract. Therapeutic intervention will not be able to be performed given the fact that he is on Plavix at this time. We do wish to assess for evidence of a gastric ulcer as well as a possible source of active bleeding given the extent of anemia on admission. Order was placed, n.p.o. after midnight. We will hold Zantac 150 mg p.o. b.i.d. at this time. We will Protonix 40 mg once a day. Order placed. Given previous treatment for Helicobacter pylori and as previously stated eradication not confirmed, we will proceed with Helicobacter pylori stool study at this time. Order placed. We will continue to monitor hemodynamic status as well as laboratory results.    ____________________________ Payton Emerald, NP dsh:si D: 09/16/2012 17:41:00 ET T: 09/16/2012 18:25:21 ET JOB#: [id number]  cc: Payton Emerald, NP, <Dictator> Payton Emerald MD ELECTRONICALLY SIGNED 09/17/2012 7:49

## 2014-12-08 NOTE — Op Note (Signed)
PATIENT NAME:  MARSALIS, BEAULIEU MR#:  [id number] DATE OF BIRTH:  [birth year]  DATE OF PROCEDURE:  04/28/2013  PREOPERATIVE DIAGNOSES: 1.  End-stage renal disease with poorly functioning left radiocephalic AV fistula.  2.  Coronary artery disease.  3.  Carotid artery disease.  4.  Diabetes mellitus.   POSTOPERATIVE DIAGNOSES:  1.  End-stage renal disease with poorly functioning left radiocephalic AV fistula.  2.  Coronary artery disease.  3.  Carotid artery disease.  4.  Diabetes mellitus.   PROCEDURES: 1.  Ultrasound guidance for vascular access to radial artery distal to radiocephalic anastomosis.  2.  Left upper extremity fistulogram and central venogram.  3.  Percutaneous transluminal angioplasty of cephalic vein about 4 to 5 cm beyond the anastomosis with 7, 8 and 10 mm diameter angioplasty balloons.  4.  Percutaneous transluminal angioplasty of radial artery with 4 mm diameter angioplasty balloon.   SURGEON: Leotis Pain, MD  ANESTHESIA: Local with moderate conscious sedation.   ESTIMATED BLOOD LOSS: Minimal.   INDICATION FOR PROCEDURE: This is a 74 year old white male with end-stage renal disease. He has had a previous intervention performed. There is still difficult to treat disease in both radial artery at and just proximal to the anastomosis as well as in the cephalic vein not far beyond the anastomosis. I felt the best way to approach this would be from a radial approach distal to the anastomosis to allow access to both locations simultaneously. Risks and benefits were discussed. Informed consent was obtained.   DESCRIPTION OF PROCEDURE: The patient was brought to the vascular and interventional radiology suite. Left upper extremity was sterilely prepped and draped, and a sterile surgical field was created. The radial artery about 5 cm beyond the anastomosis was visualized with ultrasound and found to be patent. It was then accessed under direct ultrasound guidance without  difficulty with a Seldinger needle. A micropuncture needle, micropuncture wire and sheath were then placed, and then over a J-wire I placed in the 6-French sheath. The patient was given 2500 units of intravenous heparin for systemic anticoagulation. I then performed imaging. There was a stenosis in the cephalic vein that was in the 65% to 70% range and there was an associated pseudoaneurysm in this area, which was small. This was again about 4 cm beyond the anastomosis. The remainder of the outflow was good through the cephalic vein. There was 1 frozen vein valve in the upper arm cephalic vein, but this did not appear flow limiting. The central venous circulation was intact. I crossed the lesion without difficulty. I initially performed angioplasty with a 7 and an 8 mm diameter angioplasty balloon. While this balloon was inflated, it allowed [place] to evaluate the radial artery all the way up to the brachial artery bifurcation. The artery had some narrowing as it was somewhat pulled up with the anastomosis in a not unusual fashion for radiocephalic anastomosis. I elected to treat this area with a 4 mm diameter angioplasty balloon. I redirected the wire and the help of a Kumpe catheter and gained access to the radial artery. I treated this lesion with a 4 mm diameter angioplasty balloon. There was significant spasm of the radial artery following this, but the area of stenosis did appear to be significantly improved. I then redirected back up through the fistula as there was still some residual narrowing in the cephalic vein. A 10 mm diameter angioplasty balloon was then inflated to treat this lesion. A waste was taken that resolved at about  14 atmospheres. Completion angiogram following this showed significant diminishment in the degree of stenosis, which was now less than 30%, and I elected to terminate the procedure. The sheath was removed around a 4-0 Monocryl pursestring suture, pressure was held and a sterile  dressing was placed. The patient tolerated the procedure well and was taken to the recovery room in stable condition.  ____________________________ Algernon Huxley, MD jsd:sb D: 04/28/2013 10:37:56 ET T: 04/28/2013 10:50:57 ET JOB#: [id number]  cc: Algernon Huxley, MD, <Dictator> Algernon Huxley MD ELECTRONICALLY SIGNED 05/11/2013 11:38

## 2014-12-08 NOTE — Op Note (Signed)
PATIENT NAME:  Nicholas [name], Nicholas [name] MR#:  [id number] DATE OF BIRTH:  1940/11/07  DATE OF PROCEDURE:  09/09/2012  PREOPERATIVE DIAGNOSES: 1. Carotid artery stenosis.  2. Chronic kidney disease precluding CT angiogram.  3. Hypertension.  4. Coronary artery disease.   POSTOPERATIVE DIAGNOSES:  1. Carotid artery stenosis.  2. Chronic kidney disease precluding CT angiogram.  3. Hypertension.  4. Coronary artery disease.   PROCEDURES: 1. Ultrasound guidance for vascular access, right femoral artery.  2. Catheter placement into right common carotid artery from right femoral approach.  3. Thoracic aortogram and right cervical and cerebral carotid arteriogram.  4. StarClose closure device, right femoral artery.   SURGEON: Algernon Huxley, MD  ANESTHESIA: Local with moderate conscious sedation.   ESTIMATED BLOOD LOSS: 25 mL.  CONTRAST USED: Approximately 40 mL Visipaque.   INDICATION FOR PROCEDURE: This is a 74 year old white male with chronic kidney disease and carotid artery stenosis. A CT angiogram could not be performed due to his renal insufficiency and we are performing a carotid angiogram for further evaluation. Risks and benefits were discussed and informed consent was obtained.   DESCRIPTION OF PROCEDURE: The patient is brought to the vascular and interventional radiology suite. Groins were shaved and prepped and a sterile surgical field was created. The ultrasound was used to visualize a patent right femoral artery. This was accessed under direct ultrasound guidance without difficulty with a Seldinger needle. A J-wire and 5 French sheath were placed. A small dose of intravenous heparin was given for systemic anticoagulation and the pigtail catheter was placed in the ascending aorta. This demonstrated a replaced right subclavian artery with take off distal to the left subclavian artery. The right common carotid artery and the left common carotid artery had separate origins. There was no  stenosis in any portion of the proximal great vessels. I then used a JB-2 catheter to selectively cannulate the right common carotid artery. Cervical and cerebral imaging was then performed. This demonstrated an approximately 85% to 90% right ICA stenosis at its origin from the carotid bifurcation. Cerebral flow showed good middle cerebral artery filling, but no significant anterior cerebral artery filling and no focal deficits distally. At this point, I elected to terminate the procedure. The diagnostic catheter was removed, oblique arteriogram was performed of the right femoral artery and StarClose closure device was deployed in the usual fashion with excellent hemostatic result. The patient tolerated the procedure well and was taken to the recovery room in stable condition.  ____________________________ Algernon Huxley, MD jsd:sb D: 09/09/2012 12:24:15 ET T: 09/09/2012 12:33:14 ET JOB#: [id number]  cc: Algernon Huxley, MD, <Dictator> Algernon Huxley MD ELECTRONICALLY SIGNED 09/13/2012 9:12

## 2014-12-08 NOTE — Consult Note (Signed)
Brief Consult Note: Diagnosis: Chest pain with complaint of dyspepsia.  Heartburn.  Dyspepsia.  History of H. Pylori.  CKD.   Consult note dictated.   Recommend further assessment or treatment.   Discussed with Attending MD.   Comments: Patient's presentation discussed with Dr. Verdie Shire.  Will proceed with EGD tomorrow to allow direct luminal evaluation.  Order placed.  NPO status after midnight.  Will hold Zantac 150 mg po bid and add Protonix 40 mg po once daily. Order placed for H. Pylori stool study to be collected tonight if possible.  Will continue to monitor laboratory studies and hemodynamic status.  Electronic Signatures: Payton Emerald (NP)  (Signed 30-Jan-14 18:01)  Authored: Brief Consult Note   Last Updated: 30-Jan-14 18:01 by Payton Emerald (NP)

## 2014-12-08 NOTE — Consult Note (Signed)
EGD showed normal appearing esophagus. Mild gastropathy affecting upper stomach. Bx not taken due to plavix use. Reg diet. Continue daily protonix. Stool for H.pylori Ag. If positive, then treat with pylerra x 10days or prevpac x 10days. Will sign off. Call GI on call if GI sxs worsen. Thanks.  Electronic Signatures: Verdie Shire (MD)  (Signed on 31-Jan-14 11:14)  Authored  Last Updated: 31-Jan-14 11:14 by Verdie Shire (MD)

## 2014-12-08 NOTE — Consult Note (Signed)
PATIENT NAME:  Nicholas [name], Nicholas [name] MR#:  [id number] DATE OF BIRTH:  11-14-1940  DATE OF CONSULTATION:  09/16/2012  REFERRING PHYSICIAN:   CONSULTING PHYSICIAN:  Payton Emerald, NP  REASON FOR CONSULTATION:  Dyspepsia with progressive anemia.   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  In anticipation of carotid endarterectomy right side on 09/30/2012 by Dr. Leotis Pain.  He presented to Dr. Olin Pia office yesterday in follow-up.  He expressed concern with chest discomfort that he has been having.  He was found to have elevation in his blood sugars between 300 to 400 recently, not being obviously well-controlled as Amaryl had been increased from 4 mg twice a day.  He states that the discomfort he has been experiencing is more substernal, mid chest, describes as a heartburn, worse with exertion.  Resting helps to resolve the discomfort.  As well as ice water.  At home he was on Zantac 150 mg twice a day.  Eating also aggravates the heartburn.  No nausea.  No vomiting.  No abdominal pain.  Since being admitted his pain has resolved.  His bowels move on average every day.  No diarrhea.  No constipation.  For the past several months he has noticed darker-colored stools, black at times.  No known history of peptic ulcer disease, though his EGD which was performed by Dr. Gaylyn Cheers May of last year did reveal evidence of diffuse and patchy moderate inflammation characterized by erythema and granularity in the gastric body and gastric atrium.  Biopsies were obtained which revealed oxidant antral mucosa with moderate chronic active gastritis, rare Helicobacter organisms are identified.  He was treated with amoxicillin 500 mg twice a  day for 10 days along with Levaquin 250 mg every other day and Prevacid 30 mg twice a day for 10 days.  So, I guess the Levaquin actually was every other day during the course of 10 days.  States that he has excessive belching as well over the past 7 to 10 days, regurgitating food at times, states that the taste is like "car gas."  Food getting hung to lower esophageal area where the pain is occurring, solid foods predominantly.  No need for regurgitation.  Drinking liquids will allow food bolus to progress to gastric body.  No difficulty with drinking liquids.   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   ALLERGIES:  STATINS AND OMEPRAZOLE CAUSES DIARRHEA.  HOME MEDICATIONS:  Ultracet 1 tablet 3 times daily as needed, vitamin D 1000 units daily, Zantac 150 mg twice a day, Flomax 0.4 mg at bedtime, allopurinol 200 mg daily, clonidine 0.1 mg twice a day, Senokot 2 tablets at bedtime as needed for constipation, Lasix 80 mg twice a day, amlodipine 5 mg daily, Neurontin 300 mg 4 times daily and Amaryl 4 mg twice daily.  FAMILY HISTORY:  Significant for coronary artery disease, heart failure, hypertension and gout.  Denies neoplasm.   SOCIAL HISTORY:  Remote tobacco use, quit in 1992.  No alcohol.   REVIEW OF SYSTEMS:  Please see history of present illness.  No vision changes.  Significant for dysphagia.  No change in bowel habits.  Remainder of  complete review of systems is negative.   PHYSICAL EXAMINATION: VITAL SIGNS:  Temperature 97.9, pulse is 77, respirations are 20, blood pressure is 159/80 and pulse ox is 96% on room air.  GENERAL:  Well-developed, well-nourished 74 year old Caucasian gentleman, no acute distress noted.  Resting comfortably in bed.  HEENT:  Normocephalic, atraumatic.  Pupils equal and reactive to light.  Conjunctivae pale.  Sclerae anicteric.  NECK:  Supple.  Trachea midline.  PULMONARY:  Symmetric rise and fall of chest.  Clear to auscultation throughout.  No adventitious sounds.  CARDIOVASCULAR:  Regular rate and rhythm, S1, S2.  No murmurs, no gallops.  ABDOMEN:  Soft, nondistended.  Bowel sounds in four quadrants.  No evidence of hepatosplenomegaly.  No bruits.  No masses.  RECTAL:  Deferred.  MUSCULOSKELETAL:  No contractures.  No clubbing.  Gait not assessed.  EXTREMITIES:  No edema.  SKIN:  Warm, dry.  Color pale.  No lesions.  No rashes.  NEUROLOGICAL:  No gross neurological deficits noted.  PSYCHIATRIC:  Alert and oriented x 4.  Memory grossly intact.   LABORATORY AND DIAGNOSTIC DATA:  Chemistry panel on admission, glucose is 279, BUN 55, creatinine was 3.43, EGFR was 17.  Today's date, January 30, glucose remains elevated at 200, serum iron is 22, BUN is 55, creatinine is 3.27.  EGFR is 18.  Hepatic panel within normal limits except AST is 12, ALT is 10.  Cardiac enzymes, first in series, CK total 72, CK-MB is 3.7, troponin is 0.20.  Second in series, CK total is 70, CK-MB is 3.5, troponin is 0.41.  Third in series, CK total is 46, CK-MB is 2.0 and troponin is 0.23.  TSH is 2.36.  CBC, RBC was 2.77, hemoglobin 7.7, hematocrit is 23.2.  Today, RBC is 2.72, hemoglobin is 7.4 and hematocrit is 22.7.  Differential within normal limits.  Antibody screen is negative.  ABO group with Rh type is A+.  Urinalysis, glucose greater than 500, protein is 100 mg/dL and then otherwise within normal limits.  Insulin plus  C-peptide insulin level is elevated at 49.4 with a C-peptide serum at 10.7.   Stress test performed today, Myoview scan, preliminary results revealed normal Lexiscan infusion.  EKG without evidence of ischemia or arrhythmia, normal LV systolic function with ejection fraction of 56%, normal myocardial perfusion without evidence of myocardial ischemia.  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  No focal pneumonia.   IMPRESSION:  Anemia, suspected in correlation with chronic blood loss though pending occult fecal result.  Complaint of substernal, midsternal chest pain with associated dyspepsia.  Questionable melena.  Negative echo Myoview results from the standpoint of ischemia.  History of Helicobacter pylori being treated May 2013 with a stool study not being done afterwards to confirm eradication.  Concern existing.   PLAN:  The patient's presentation will be discussed with Dr. Verdie Shire and care plan to follow in addendum form.   These services provided by Payton Emerald, NP under collaborative agreement with Verdie Shire, M.D.      ____________________________ Payton Emerald, NP dsh:ea D: 09/16/2012 16:59:08 ET T: 09/16/2012 23:01:59  ET JOB#: [id number]  cc: Payton Emerald, NP, <Dictator> Payton Emerald MD ELECTRONICALLY SIGNED 09/17/2012 7:49

## 2014-12-08 NOTE — Consult Note (Signed)
Chief Complaint and History:   Referring Physician Dr. Caryl Comes    Chief Complaint Uncontrolled diabetes    History of Present Illness 74 yo M with h/o CAD, DM2, stage IV CKD, admitted with chest pain and concerns for unstable angina. I am asked to see patient for assistance with diabetes management. He has had diabetes since 2009. He has been taking sulfonylureas only since the time of diagnosis. He is currently taking Amaryl 4 mg bid. He believes diabets was well controlled up until 4 weeks ago. he checks he sugar 3 times day and in the last few weeks sugar has ranged mid 200 - high 400s. Diabetes is uncontrolled. He has weakess, increased thirst and polyuria when sugar is high. He denies use of glucocorticoids. He has not had any recent illness or recently changed his diet. He is not sure what has led to worsened glycemic control. His last Hgb A1c on 06/08/12 was 7.3%. Since admission yesterday, sugars have ranged 200-301. He is on Amaryl4 mg bid and Regular insulin slding scale qACHS. He is tolerating his 1800 kcal carb control diet. He has a good appetite. He denies any chest pain at this time.    Past History 1.    Coronary artery disease, status post angioplasty in 1992, done by Dr. Ritta Slot. Myoview low probability for ischemia, with echocardiogram revealing preserved LV function, 2007. ([facility]) 2.    Gout ([place])   a.  Tophi   b.  Allopurinol 3.    Benign prostatic hypertrophy. 4.    Hypertension.  5.    Osteoporosis.  6.    Osteoarthritis ([place])   a.  Scoliosis   b.  Hip   c.  Hands 7.    History of tobacco use.  8.    Hemorrhoids.  9.    Osteoarthritis.  10.  Cataracts. 11.  Depression.  12.  Ankylosing spondylitis ([place])     a.  Effusion on MRI     b.  Positive HLAB27 13.  Renal insufficiency ([place])   a.  Proteinuria   b.  Nonsteroidal versus IGA nephropathy   c.  Edema. 6.  Adult onset diabetes mellitus diagnosed May 2009 15.  Hyperlipidemia  16.  Edema, likely venous insufficiency.  [facility] arranged June 2009. 17.  Hospitalization with rhabdomyolysis secondary to use of statin while on gemfibrozil. 45.  Hospitalized 6/12 with bilateral leg weakness, acute renal failure due to ATN/CKD, urinary obstruction.  Evaluated by Neurology, Rheumatology, Nephrology.  Leg weakness improved with improving renal function 19.  Hospitalized 5/13 with GI bleeding; anticoagulation held; thought to be from diverticular bleeding  SURGICAL: 1.    Cataract surgery bilaterally.  2.    Status post angioplasty in 1992, done by Dr. Ritta Slot. 3.    Surgery to remove hemorrhoid. 4.    Fusion of vertebra. 5.    Status post left total hip replacement 2003, done by Dr. Mauri Pole.  6.    AV fistula formation left forearm, 2012   Allergies:  Lovastatin: Muscles aches  Omeprazole: Other  Statins: Other  OTHER MEDICATIONS:   Medications Outpatient Medications: Amaryl tablet (glimepiride) 2 mg  - Take 1 tablet by mouth every morning  Ultracet tablet (tramadol-acetaminophen) 37.5-325 mg  -  1 tablet by mouth three times a day as needed for pain Vitamin D3 capsule (cholecalciferol (vitamin d3)) 1,000 unit  - Take 1 capsule by mouth once a day  Zantac tablet (ranitidine hcl) 150 mg  - Take 1 tablet by  mouth twice a day  Flomax capsule,extended release 24hr (tamsulosin) 0.4 mg  - Take 1 capsule by mouth at bedtime  Neurontin capsule (gabapentin) 300 mg  - Take 1 capsule by mouth three times a day  [Begin with one at bedtime, and after 4 days go up to 2 at bedtime if no improvement] allopurinol tablet (allopurinol) 100 mg  - Take 1 tablet by mouth twice a day  ferrous sulfate tablet,delayed release (DR/EC) (ferrous sulfate) 324 mg (65 mg iron)  - Take 1 tablet by mouth once a day as directed clonidine tablet (clonidine) 0.1 mg  - Take 1 tablet by mouth twice a day  [for blood pressure] Senokot tablet (sennosides) 8.6 mg  -  2 tablets by mouth at bedtime as directed Lasix tablet  (furosemide) 80 mg  - Take 1 tablet by mouth twice a day  Lipitor tablet (atorvastatin) 20 mg  - Take 1 tablet by mouth once a day  amlodipine tablet (amlodipine) 5 mg  - Take 1 tablet by mouth once a day  metoprolol tartrate tablet (metoprolol tartrate) 25 mg  - Take 1 tablet by mouth twice a day   Past Family Social History:   Family History Non-Contributory     Family/Social Hx. Comments Father is deceased at the age of 71 secondary to a heart attack.  Mother is deceased at the age of 31 secondary to heart failure.  He has one brother who is alive and well.  Does have family history of hypertension, arthritis, heart disease and gout.  He has 2 children who are alive and well.    SOCIAL HISTORY: He is married living with his wife.  Has smoked cigarettes in the past and quit smoking in 1992.  Does not drink alcohol.   Review of Systems:   Review of Systems   GENERAL:  Patient denies weight-loss, weight-gain or fevers.  Patient has fatigue. Patient denies headaches. He reports numbness in the feet. he denies paresthesias in the feet. Patient denies blurred vision.Denies tooth pain. Aware that he has several teeth that should be pulled. Patient denies neck swelling. She denies neck pain and denies difficulty with swallowing. Patient denies chest pain or palpitations. Patient denies SOB.  Patient denies cough.   Patient denies abdominal pain, nausea and vomiting and constipation.  Patient  denies blood in the stool. Patient denies dysuria and hematuria.  Patient has had leg swelling. Patient has easy bruisability. SKIN:  Patient denies any rash.     Physical Exam:   General Obese WM in NAD    Skin No rash or dermatopathy    Eyes EOMI    ENMT OP clear, MMM, dentition poor    Head and Neck Neck supple, no thyromegaly or TTP    Respiratory and Thorax Clear b/l, no wheeze, normal inspiratory effort    Cardiovascular RRR, no carotid bruit    Gastrointestinal Soft, NT/ND     Musculoskeletal No leg edema. No bony structural abnormalities. Soft tissue swelling ot rt olecranon is prominent and non-tender without overlying skin changes.    Neurological Decreased sensation on the feet    Psychiatric A&O x3, calm & cooperative    Lymphatics No cervical LAD   VITAL SIGNS/ANCILLARY NOTES: **Vital Signs.:   30-Jan-14 08:13   Vital Signs Type Routine   Temperature Temperature (F) 98.1   Celsius 36.7   Temperature Source oral   Pulse Pulse 97   Respirations Respirations 18   Systolic BP Systolic BP 440  Diastolic BP (mmHg) Diastolic BP (mmHg) 84   Mean BP 107   Pulse Ox % Pulse Ox % 96   Pulse Ox Activity Level  At rest   Oxygen Delivery Room Air/ 21 %   Laboratory Results:  Routine Chem:  30-Jan-14 06:30    Iron Binding Capacity (TIBC) 377   Unbound Iron Binding Capacity 355   Iron, Serum  22   Iron Saturation 6 (Result(s) reported on 16 Sep 2012 at [facility].)   Ferritin [facility]) 27 (Result(s) reported on 16 Sep 2012 at 08:22AM.)   Result Comment troponin - RESULTS VERIFIED BY REPEAT TESTING.  - prev called [id number]   - rw  Result(s) reported on 16 Sep 2012 at 07:40AM.   Glucose, Serum  200   BUN  55   Creatinine (comp)  3.27   Sodium, Serum 138   Potassium, Serum 3.8   Chloride, Serum 106   CO2, Serum 22   Calcium (Total), Serum 8.7   Anion Gap 10   Osmolality (calc) 296   eGFR (African American)  21   eGFR (Non-African American)  18 (eGFR values <23m/min/1.73 m2 may be an indication of chronic kidney disease (CKD). Calculated eGFR is useful in patients with stable renal function. The eGFR calculation will not be reliable in acutely ill patients when serum creatinine is changing rapidly. It is not useful in  patients on dialysis. The eGFR calculation may not be applicable to patients at the low and high extremes of body sizes, pregnant women, and vegetarians.)  Cardiac:  30-Jan-14 06:30    CK, Total 46   CPK-MB, Serum 2.0 (Result(s)  reported on 16 Sep 2012 at 07:18AM.)   Troponin I  0.23 (0.00-0.05 0.05 ng/mL or less: NEGATIVE  Repeat testing in 3-6 hrs  if clinically indicated. >0.05 ng/mL: POTENTIAL  MYOCARDIAL INJURY. Repeat  testing in 3-6 hrs if  clinically indicated. NOTE: An increase or decrease  of 30% or more on serial  testing suggests a  clinically important change)  Routine Hem:  30-Jan-14 06:30    Retic Count 1.9   Absolute Retic Count 0.052 (Result(s) reported on 16 Sep 2012 at 07:51AM.)   WBC (CBC) 8.4   RBC (CBC)  2.72   Hemoglobin (CBC)  7.4   Hematocrit (CBC)  22.7   Platelet Count (CBC) 265   MCV 83   MCH 27.0   MCHC 32.4   RDW  15.3   Neutrophil % 71.0   Lymphocyte % 14.5   Monocyte % 7.3   Eosinophil % 5.9   Basophil % 1.3   Neutrophil # 6.0   Lymphocyte # 1.2   Monocyte # 0.6   Eosinophil # 0.5   Basophil # 0.1 (Result(s) reported on 16 Sep 2012 at 07:00AM.)   Assessment/Plan:   Orders/Results reviewed Orders reviewed and updated, Laboratory results reviewed, Radiology results reviewed, Vital Signs reviewed, Nursing documentation reviewed    Assessment/Plan 74yo M with uncontrolled diabetes and stg IV CKD  1. Diabetes, type 2, uncontrolled with renal complications. Discssed options for changing medical mangement. He was agreeable to starting insulin. He wants to take insulin in a pen. Will start Lantus 15 units qHS. Will need to be prescribed Lantus SoloStar pen at discharge. Needs teaching on how to use the pen. Will consult CDEs to educate him on use of pen. Will titrate Lantus as needed. Will ultimately plan to stop the Amaryl, however first will decrease dose to 2 mg bid now. Continue 1800 kcal  carb diet.   I will follow with you and plan for out-pt follow-up in 2-3 weeks at discharge.   Electronic Signatures: Judi Cong (MD)  (Signed 30-Jan-14 13:17)  Authored: Chief Complaint and History, ALLERGIES, HOME MEDICATIONS, Other Medications, PFSH, Review of Systems,  Physcial Exam, Vital Signs, LABS, Assessment/Plan   Last Updated: 30-Jan-14 13:17 by Judi Cong (MD)

## 2014-12-08 NOTE — Consult Note (Signed)
Chief Complaint:   Subjective/Chief Complaint Less dyspepsia/heartburn overnight. Now on protonix. Please see Dawn Harrison's notes from yest.   VITAL SIGNS/ANCILLARY NOTES: **Vital Signs.:   31-Jan-14 05:08   Vital Signs Type Routine   Temperature Temperature (F) 98.3   Celsius 36.8   Temperature Source Oral   Pulse Pulse 90   Respirations Respirations 20   Systolic BP Systolic BP 056   Diastolic BP (mmHg) Diastolic BP (mmHg) 84   Mean BP 101   Pulse Ox % Pulse Ox % 96   Pulse Ox Activity Level  At rest   Oxygen Delivery Room Air/ 21 %   Brief Assessment:   Cardiac Regular    Respiratory clear BS    Gastrointestinal Normal   Lab Results: Routine Chem:  31-Jan-14 05:18    Hemoglobin A1c ([facility])  11.7 (The American Diabetes Association recommends that a primary goal of therapy should be <7% and that physicians should reevaluate the treatment regimen in patients with HbA1c values consistently >8%.)   Glucose, Serum  130   BUN  50   Creatinine (comp)  3.02   Sodium, Serum 139   Potassium, Serum 4.1   Chloride, Serum  110   CO2, Serum  20   Calcium (Total), Serum 9.0   Anion Gap 9   Osmolality (calc) 293   eGFR (African American)  23   eGFR (Non-African American)  20 (eGFR values <39mL/min/1.73 m2 may be an indication of chronic kidney disease (CKD). Calculated eGFR is useful in patients with stable renal function. The eGFR calculation will not be reliable in acutely ill patients when serum creatinine is changing rapidly. It is not useful in  patients on dialysis. The eGFR calculation may not be applicable to patients at the low and high extremes of body sizes, pregnant women, and vegetarians.)  Routine Hem:  31-Jan-14 05:18    Hematocrit (CBC)  26.1 (Result(s) reported on 17 Sep 2012 at 06:16AM.)   Radiology Results: Nuclear Med:    30-Jan-14 10:55, NM MYOCARDIAL SCAN   NM MYOCARDIAL SCAN    PRELIMINARY REPORT    The following is a PRELIMINARY Radiology  report.  A final report will follow pending radiologist verification.      REASON FOR EXAM:    chest pain  COMMENTS:       PROCEDURE: NM  - NM MYOCARDIAL SCAN  - Sep 16 2012 10:55AM     RESULT:     HISTORY: This is a 74 year old male with chest discomfort consistent with   active cardiovascular disease. The patient underwent a Lexiscan infusion   over 1 minute with a peak heart rate of 103 beats per minute. The patient   received 0.4 mg of Lexiscan. Peak systolic blood pressure 979/48. The   patient had no evidence of chest discomfort. Baseline EKG shows normal   sinus rhythm. Normal EKG. Peak stress shows no electrocardiographic   changes or arrhythmia. The patient received 13.1 mCi of Sestamibi with   rest and 29.764 of Sestamibi peak stress intravenously in the right     bicep.     IMAGE ANALYSIS: Normal LV systolic function with ejection fraction of   56%. All myocardial walls have normal myocardial function and wall   motion. Rest and peak stress myocardial perfusion imaging was compared   showing normal myocardial perfusion at rest and peak stress without   evidence of myocardial ischemia.    IMPRESSION:     1.Normal Lexiscan infusionEKG without evidence of ischemia or  arrhythmia.    2.Normal LV systolic function with ejection fraction of 56%.    3.Normal myocardial perfusion without evidence of myocardial ischemia.    Dictated By: Corey Skains  ([facility]), M.D., MD   Assessment/Plan:  Assessment/Plan:   Assessment Atypical CP. Suspect GERD. Not adequately treated with zantac bid. Now on protonix.    Plan Plan EGD later this AM. Due to plavix use, no plan for bx today. Check stool for H.pylori Ag to see if H. pylori eradicated from previous Abx coverage last year. Thanks.   Electronic Signatures: Verdie Shire (MD)  (Signed 31-Jan-14 07:44)  Authored: Chief Complaint, VITAL SIGNS/ANCILLARY NOTES, Brief Assessment, Lab Results, Radiology Results,  Assessment/Plan   Last Updated: 31-Jan-14 07:44 by Verdie Shire (MD)

## 2014-12-08 NOTE — Discharge Summary (Signed)
PATIENT NAME:  Nicholas [name], Nicholas [name] MR#:  [id number] DATE OF BIRTH:  25-May-1941  DATE OF ADMISSION:  09/15/2012 DATE OF DISCHARGE:  09/18/2012  PRIMARY CARE PHYSICIAN: [name], MD   [place] ENDOCRINOLOGIST: Lucilla Lame, MD   [place] GASTROENTEROLOGIST: Verdie Shire, MD  CONSULTATING NEPHROLOGIST: Murlean Iba, MD   DISCHARGE DIAGNOSES: 1. Chest pain with negative Myoview.  2. Anemia, status post 1 unit, no active bleeding, EGD negative.  3. Chronic renal failure with arteriovenous fistula in place but no indication for acute dialysis.  4. Hypertension.  5. Poorly controlled diabetes.  6. Ankylosing spondylitis.  7. Gout.   HISTORY OF PRESENT ILLNESS: Please see admission history and physical from January 29th. 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 He was admitted with elevated sugars as well as chest pain and nausea. He was admitted when he was seen in Dr. Olin Pia clinic for evaluation of his chest pain.   HOSPITAL COURSE:  1. Chest pain: This was worrisome for unstable angina. He had a stress test, however, that was negative, and his cardiac enzymes were normal.  This was felt likely due to a GI source of pain. The pain completely resolved.  2. Chronic kidney disease: He was seen by Nephrology. There is no indication for dialysis.  3. Gastrointestinal: He had a scope done which was relatively unrevealing for any source of pain or bleeding. Dr. Candace Cruise recommended continuing omeprazole and following up as an outpatient.  4. Diabetes: This is very poorly controlled with an A1c over 11. He was seen by Dr. Gabriel Carina and started on Lantus as well as NovoLog. He will also continue on his Amaryl. Sugars came under better control with this regimen.  5. Gout: He continues on allopurinol.  6. Hypertension: He remains relatively reasonably controlled with his current regimen.    DISCHARGE MEDICATIONS: 1. Tamsulosin 0.4 mg once a day.  2. Sodium bicarbonate 650, 2 tablets twice a day.  3. Allopurinol 100 mg once a day.  4. Clonidine 0.1 twice a day.  5. Amlodipine 5 mg once a day.  6. Tramadol 50, 3 times a day.  7. Plavix 75 once a day.  8. Renvela 800 mg 3 times a day.  9. Lasix 20 mg once a day.  10. Gabapentin 300 mg 3 times a day.  11. Glimepiride 2 mg twice a day.  12. Lantus insulin 15 units at bedtime.  13. Sliding scale insulin.  14. Pantoprazole 40 mg once a day.   STOP MEDICATION: The patient is to stop taking Zantac.   DIET: The patient will be discharged home on an ADA carbohydrate-controlled diet.   FOLLOWUP: He will follow up with Dr. Caryl Comes within 1 to 2 weeks, and he already has an appointment with Dr. Gabriel Carina.   TIME SPENT: This discharge took 40 minutes.  ____________________________ Cheral Marker. Ola Spurr, MD [id number] D: 09/18/2012 12:19:47 ET T: 09/19/2012 15:50:36 ET JOB#: [id number]  cc: Cheral Marker. Ola Spurr, MD, <Dictator> Arinze Rivadeneira Ola Spurr MD ELECTRONICALLY SIGNED 09/26/2012 21:54

## 2014-12-08 NOTE — H&P (Signed)
PATIENT NAME:  Nicholas [name], Nicholas [name] MR#:  [id number] DATE OF BIRTH:  05-19-1941  DATE OF ADMISSION:  09/15/2012  CHIEF COMPLAINT: Chest pain.   HISTORY OF PRESENT ILLNESS: A 74 year old male with history of coronary artery disease, gout, chronic kidney disease, stage IV to V, with hypertension and arthritis. He recently underwent evaluation through vascular surgery with evidence of significant right carotid artery stenosis and there is anticipation of carotid endarterectomy in the next couple of weeks. He reports the fact that he has got upcoming preop evaluation. He reports over the last several weeks his sugars have been 300 to 400; Amaryl had been increased to 4 mg b.i.d. and his sugars are better, though still the 200 range. He comes in today for routine recheck, but then stating that he has been having chest pain with walking at times, associated with nausea. He had an episode today getting out of a truck, which was quite severe, and the person bringing him actually felt that they probably needed to bring him to the Emergency Room. The discomfort is improved currently. He denies shortness of breath, diaphoresis or palpitations. He is admitted now with chest pain, likely unstable angina with known peripheral vascular disease and prior coronary disease as well as uncontrolled diabetes in a patient with stage IV chronic kidney disease.   PAST MEDICAL HISTORY:  1.  Coronary artery disease with prior angioplasty 2.  Gout with chronic tophi.  3.  BPH. 4.  Hypertension.  5.  Osteoporosis.  6.  Osteoarthritis with chronic pain syndrome.  7.  History of depression and anxiety.  8.  Ankylosing spondylitis.  9.  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  10. Adult onset diabetes mellitus, uncontrolled.  11. Hyperlipidemia.  12. Chronic venous insufficiency.  13. History of rhabdomyolysis on statins.  14. History of GI bleed thought to be diverticular bleeding.  15. History of  bilateral cataract surgery.  16. History of vertebral fusion.  17.; History of left hip replacement in 2003.  18. AV fistula, left forearm placed in 2012.   ALLERGIES: STATINS AND OMEPRAZOLE. OMEPRAZOLE CAUSES DIARRHEA.   MEDICATIONS:  1.  Ultracet 1 t.i.d. p.r.n.  2.  Vitamin D 1000 units p.o. daily. 3.  Xanax 150 mg p.o. b.i.d.  4.  Flomax 0.4 mg p.o. at bedtime.  5.  Allopurinol 200 mg p.o. daily.  6.  Clonidine 0.1 mg p.o. b.i.d.  7.  Senokot 2 tablets at bedtime as needed for constipation.  8.  Lasix 80 mg p.o. b.i.d.  9.  Amlodipine 5 mg p.o. daily.  10. Neurontin 300 mg p.o. 4 times a day.  11. Amaryl 4 mg p.o. b.i.d.   SOCIAL HISTORY: Remote tobacco, stopped in 1992. No alcohol.   FAMILY HISTORY: Coronary artery disease, heart failure, hypertension and gout.   REVIEW OF SYSTEMS: Please see HPI. No vision changes; no dysphagia; no changes in bowels. Remainder of complete review of systems is negative.   PHYSICAL EXAMINATION:  VITALS: Weight 194, blood pressure 136/77, temperature is 97.4, pulse 98.  GENERAL: Well-developed, well-nourished male, appears mildly uncomfortable.  EYES: Pupils are postoperative. Lids and conjunctiva unremarkable.  EARS, NOSE, AND THROAT: External examination unremarkable. The oropharynx is moist with poor dentition.  NECK: Supple. Trachea midline. No thyromegaly.  CARDIOVASCULAR: Regular rate and rhythm without murmurs, gallops, or rubs. Carotid pulses 2+. Bilaterally. AV fistula in the left forearm with palpable thrill. Dorsalis pedis pulses are trace.  LUNGS: Clear bilaterally without wheeze or retraction.  Saturation 100% on room air.  ABDOMEN: Soft, nontender, nondistended. Positive bowel sounds.  SKIN: Solar damage without significant rashes or nodules.  LYMPH NODES: No cervical or supraclavicular nodes.  MUSCULOSKELETAL: No clubbing or cyanosis. Trace ankle edema. Chronic tophaceous changes on the elbows bilaterally.  NEUROLOGIC: Cranial  nerves intact. Bilateral lower extremity weakness, chronic.   IMPRESSION AND PLAN:  1.  Chest pain, worrisome for unstable angina in a patient with history of coronary artery disease and active, peripheral vascular disease. He is statin intolerant. Place on Plavix, avoiding aspirin secondary to his renal failure. Plan Myoview for now, consider cardiology consultation. Catheterization in this patient is risky given his chronic kidney disease; however, may be necessary.  2.  Chronic kidney disease, stage IV. Will go ahead and ask nephrology to see the patient in the event that he winds up needing dye; also feel we need their opinion before he goes for carotid endarterectomy; with that of course will depend on what we find with the above.  3.  Uncontrolled diabetes mellitus. I will ask endocrinology to see the patient as I think he will probably need to go on insulin. Place on sliding scale insulin for now. Place on Amaryl at his home dose for now and will get C-peptide to evaluate his insulin production.  4.  Gout. Continue allopurinol.  5.  Hypertension as above. Will hold Lasix for now.  6.  Chronic pain syndrome. We will try to hold the tramadol and use morphine for breakthrough pain.    ____________________________ [name], MD [id number] D: 09/15/2012 13:18:43 ET T: 09/15/2012 14:14:43 ET JOB#: [id number]  cc: [name], MD, <Dictator> Ramonita Lab MD ELECTRONICALLY SIGNED 09/19/2012 19:07

## 2014-12-10 NOTE — Discharge Summary (Signed)
PATIENT NAME:  Nicholas [name], Nicholas [name] MR#:  [id number] DATE OF BIRTH:  Aug 08, 1941  DATE OF ADMISSION:  01/07/2012 DATE OF DISCHARGE:  01/11/2012  REASON FOR ADMISSION: Blood per rectum.   HISTORY OF PRESENT ILLNESS: Please see the dictated history of present illness done by Dr. Caryl Comes on 01/07/2012.   PAST MEDICAL HISTORY:  1. Atherosclerotic cardiovascular disease status post percutaneous transluminal coronary angioplasty.  2. History of tophaceous gout.  3. Benign prostatic hypertrophy. 4. Benign hypertension.  5. Osteoporosis.  6. Osteoarthritis.  7. Ankylosing spondylitis.  8. Depression.  9. History of hemorrhoids.  10. End-stage renal disease.  11. Type 2 diabetes.  12. Chronic venous insufficiency with edema.  13. History of rhabdomyolysis.  14. Bilateral cataract surgery.  15. Status post hemorrhoidectomy.  16. Status post left hip surgery.   MEDICATIONS ON ADMISSION: Please see admission note.   ALLERGIES: Lovastatin and Fenofibrate.   SOCIAL HISTORY, FAMILY HISTORY, AND REVIEW OF SYSTEMS: As per admission note.   PHYSICAL EXAM: The patient was in no acute distress. Vital signs remarkable for a blood pressure of 96/52 and heart rate of 68. HEENT exam was unremarkable. Neck was supple without jugular venous distention. Lungs were clear. Cardiac exam revealed a regular rate and rhythm. Normal S1, S2. Abdomen was soft with some minimal tenderness in the left lower quadrant. Extremities were without edema. Neurologic exam was grossly nonfocal.   HOSPITAL COURSE: The patient was admitted with acute on chronic blood loss anemia with GI bleed. Endoscopy revealed mild gastritis. He was also felt to have a presumed diverticular bleed and was maintained on Cipro and Flagyl. His chronic renal failure was followed in the hospital by nephrology and remained stable. He had good urine output and did not require hemodialysis. His sugars remained stable. His hemoglobin stabilized without further  evidence of bleeding. He was switched to oral medications and his diet was advanced. The patient felt well and was requesting discharge. He is now discharged home in stable condition.   DISCHARGE DIAGNOSES:  1. Acute on chronic blood loss anemia.  2. GI bleed.  3. Gastritis.  4. Presumed diverticulosis with diverticular bleed.  5. End-stage renal disease.  6. Type 2 diabetes.  7. Gout.  8. Benign hypertension.   DISCHARGE MEDICATIONS:  1. Protonix 40 mg p.o. daily.  2. Metronidazole 250 mg p.o. b.i.d. x1 week.  3. Cipro 250 mg p.o. b.i.d. x1 week.  4. Lopressor 25 mg p.o. b.i.d.  5. Neurontin 300 mg p.o. b.i.d.  6. Allopurinol 200 mg p.o. daily.  7. Amaryl 2 mg p.o. daily.  8. Flomax 0.4 mg p.o. daily. 9. Clonidine 0.1 mg p.o. daily.  DIET:  The patient was discharged on a mechanical soft diet.   FOLLOW-UP PLANS AND APPOINTMENTS: He will follow-up with Dr. Caryl Comes on 01/13/2012. He has scheduled follow-up appointment with Dr. Candiss Norse as well for his endstage renal disease. He is to return if his symptoms should recur.    ____________________________ Leonie Douglas. Doy Hutching, MD jds:rbg D: 01/11/2012 09:11:22 ET T: 01/12/2012 13:00:12 ET JOB#: [id number]  cc: Leonie Douglas. Doy Hutching, MD, <Dictator> Aquarius Latouche Lennice Sites MD ELECTRONICALLY SIGNED 01/12/2012 21:08

## 2014-12-10 NOTE — Consult Note (Signed)
Pt EGD showed diffuse gastritis.  Will restart diet and advance as tol.  Electronic Signatures: Manya Silvas (MD)  (Signed on 23-May-13 15:16)  Authored  Last Updated: 23-May-13 15:16 by Manya Silvas (MD)

## 2014-12-10 NOTE — H&P (Signed)
PATIENT NAME:  Nicholas [name], Nicholas [name] MR#:  [id number] DATE OF BIRTH:  Oct 11, 1940  DATE OF ADMISSION:  01/07/2012  CHIEF COMPLAINT: Blood per rectum.   HISTORY OF PRESENT ILLNESS: The patient is a 74 year old male with a history of coronary artery disease, severe gout, chronic renal failure, who has an AV fistula in place but has not yet initiated hemodialysis. He reports he was in his usual state of health until yesterday when he noticed bright red blood in the toilet, some minimal left lower quadrant abdominal cramping, but without nausea, vomiting, fevers, chills. He has had several blood bowel movements since that time and so presented to the office for evaluation. He was noted to have a hemoglobin of 6.7, systolic blood pressure 96. He appears pale but has no other symptoms otherwise currently. He is admitted now with lower GI bleed with acute blood loss anemia in a patient with renal failure and coronary artery disease.   PAST MEDICAL/SURGICAL HISTORY:  1. Coronary artery disease with prior angioplasty.  2. History of tophaceous gout.  3. Benign prostatic hypertrophy.  4. Hypertension.  5. Osteoporosis.  6. Osteoarthritis.  7. History of hemorrhoids.  8. Depression.  9. Ankylosing spondylitis, positive HLA-B27.  10. Chronic renal failure, followed by Nephrology, with AV fistula left arm.  11. Adult onset diabetes mellitus, uncontrolled.  12. Chronic venous insufficiency and edema.  13. History of rhabdomyolysis on fenofibrate.  14. History of bilateral cataract surgery.  15. History of hemorrhoidectomy.  16. History of fusion of vertebrae.  17. Left total hip replacement in 2003.  18. AV fistula formation in the left foreman in 2012.   ALLERGIES: Rhabdomyolysis on fenofibrate and lovastatin. Omeprazole.  CURRENT MEDICATIONS:  1. Aspirin 81 mg p.o. daily.  2. Amaryl 2 mg p.o. every a.m.  3. Ultracet 1 p.o. t.i.d. p.r.n.  4. Vitamin D 1000 units p.o. daily.  5. Sodium bicarbonate 650  mg, 2 p.o. b.i.d.  6. Lasix 20 mg p.o. every a.m.  7. Zantac 150 mg p.o. b.i.d.  8. Clonidine 0.1 mg p.o. daily.  9. Flomax 0.4 mg p.o. at bedtime.  10. Metoprolol tartrate 25 mg p.o. b.i.d.  11. Neurontin 300 mg p.o. t.i.d.  12. Allopurinol 100 mg p.o. b.i.d.   SOCIAL HISTORY: Remote tobacco, none since 1992. No alcohol.   FAMILY HISTORY: Coronary artery disease, congestive heart failure, hypertension.   REVIEW OF SYSTEMS: Please see history of present illness. Denies vision changes. No chest pain. Some baseline dyspnea. No urinary changes. The remainder of complete review of systems is negative.   PHYSICAL EXAMINATION:  VITAL SIGNS: Blood pressure 96/52, pulse 68.   GENERAL: A pale-appearing male, in no distress.   HEENT: Eyes: Pupils are round and reactive to light with postoperative changes. Pale conjunctivae. Ears, nose, and throat: External examination unremarkable. Oropharynx: The oropharynx is moist without lesions.   NECK: Supple, trachea midline. No thyromegaly.   CARDIOVASCULAR: Regular rate and rhythm without gallops or rubs. Carotid and radial pulses are 1+. AV fistula in the left forearm with palpable thrill.   LUNGS: Clear bilaterally. No retractions.   ABDOMEN: Soft, nondistended, positive bowel sounds. Minimal tenderness to deep palpation in the left lower quadrant without guard, rebound.   SKIN: No significant rashes or nodules. Generalized pallor.   LYMPH NODES: No cervical or supraclavicular nodes.   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 No clubbing or cyanosis. Trace ankle edema.   NEUROLOGIC: Cranial nerves are intact. Light touch is decreased in the  feet bilaterally. Gait is not tested secondary to severe anemia.   LABORATORY DATA: White count 8 with hemoglobin 6.7, platelets 205. Glucose 395 with sodium 129, potassium 3, BUN 159, and creatinine 4.8.   IMPRESSION AND PLAN:  1. Lower gastrointestinal bleed with acute on chronic  blood loss anemia: I anticipate transfusion of 2 units of packed cells, we will need to watch closely for toleration of the fluid load that would go along with this. Empiric antibiotics versus possible diverticulitis. GI consultation. Monitor hemoglobin closely.  2. Chronic renal failure: We will ask Nephrology to see the patient, and they are aware that he may require dialysis while he is requiring the fluid load that, again, is associated with blood transfusion.  3. Diabetes mellitus, uncontrolled: Cover with sliding scale insulin. Continue Amaryl for now. Adjust as needed.  4. Hypokalemia: Replacing this, holding any diuretics.  5. Hyponatremia: This is in part secondary to his severe elevation of glucose.  6. Gout: Continue allopurinol.  7. Hypertension: Hold all antihypertensives at this point secondary to hypotension from his anemia.  ____________________________ [name], MD bjk:cbb D: 01/07/2012 14:52:20 ET T: 01/07/2012 15:19:29 ET JOB#: [id number]  cc: [name], MD, <Dictator> Ramonita Lab MD ELECTRONICALLY SIGNED 01/14/2012 7:43

## 2014-12-10 NOTE — Consult Note (Signed)
pt denies pain, eating well the diet he has.  No vomiting, no nausea, no GI complaints.  Continue meds, await biopsies, advance diet slowly.  Dr. Candace Cruise on this weekend but will not see unless you call about a problem.  Electronic Signatures: Manya Silvas (MD)  (Signed on 24-May-13 18:05)  Authored  Last Updated: 24-May-13 18:05 by Manya Silvas (MD)

## 2014-12-10 NOTE — Op Note (Signed)
PATIENT NAME:  Nicholas [name], Nicholas [name] MR#:  [id number] DATE OF BIRTH:  [birth year]  DATE OF PROCEDURE:  10/20/2011  PREOPERATIVE DIAGNOSES:  1. Chronic kidney disease nearing dialysis dependence.  2. Poorly maturing left radiocephalic AV fistula.  3. Rheumatoid arthritis.  4. Diabetes.   POSTOPERATIVE DIAGNOSES: 1. Chronic kidney disease nearing dialysis dependence.  2. Poorly maturing left radiocephalic AV fistula.  3. Rheumatoid arthritis.  4. Diabetes.   PROCEDURES: 1. Ultrasound guidance for vascular access, left radiocephalic AV fistula.  2. Left upper extremity fistulogram.  3. Percutaneous transluminal angioplasty of cephalic vein just beyond radiocephalic anastomosis with 5 and 6 mm diameter angioplasty balloons.   SURGEON: Algernon Huxley, MD   ANESTHESIA: Local with moderate conscious sedation.   ESTIMATED BLOOD LOSS: Minimal.  FLUOROSCOPY TIME: 2.2 minutes.   CONTRAST USED: 20 mL Visipaque.   INDICATION FOR PROCEDURE: The patient is a 74 year old white male with chronic kidney disease nearing dialysis dependence. His left radiocephalic AV fistula has not matured. He is brought in for a fistulogram for a stenosis seen on noninvasive imaging just beyond the anastomosis. This is going to be done with a limited amount of contrast so only a local view will be performed. Risks and benefits were discussed. Informed consent was obtained.   DESCRIPTION OF PROCEDURE: The patient was brought to the Vascular Interventional Radiology Suite. Left upper extremity was sterilely prepped and draped and a sterile surgical field was created. The fistula was accessed just below the antecubital fossa within the cephalic vein in a retrograde fashion. This was done under ultrasound guidance. Permanent image was recorded. Micropuncture wire and sheath were placed and we upsized to a 6 [place] sheath. A Kumpe catheter was advanced to the radial artery and imaging was performed. With the catheter in the radial  artery, the catheter was basically occlusive around the stenosis and the wire was then replaced and a 5 mm diameter angioplasty balloon was inflated from the anastomosis into the cephalic vein over several centimeters. A tight waist was taken which resolved. Following this with the Kumpe catheter replaced, there was improvement but there was still a greater than 50% residual stenosis and this was treated with a 6 mm diameter angioplasty balloon. With the Kumpe catheter placed back at the anastomosis, imaging was performed. This showed a mild stenosis that was now less than 50%. In this area there was a branch a couple of centimeters beyond this which was medium sized and may be limiting maturation some as well and if maturation does not improve with this angioplasty, he will be brought back for embolization of this likely from a radial artery approach as it was from an angle that would be difficult to get from a retrograde approach. At this point, I elected to terminate the procedure. A 4-0 Monocryl purse-string suture was placed and the sheath was removed. Pressure was held. Sterile dressing was placed. The patient tolerated the procedure well and was taken to the recovery room in stable condition.   ____________________________ Algernon Huxley, MD jsd:drc D: 10/20/2011 10:57:31 ET T: 10/20/2011 11:30:53 ET JOB#: [id number]  cc: Algernon Huxley, MD, <Dictator> [name], MD Munsoor Lilian Kapur, MD Murlean Iba, MD Algernon Huxley MD ELECTRONICALLY SIGNED 10/22/2011 11:48

## 2014-12-10 NOTE — Op Note (Signed)
PATIENT NAME:  Nicholas [name], Nicholas [name] MR#:  [id number] DATE OF BIRTH:  [birth year]  DATE OF PROCEDURE:  12/29/2011  PREOPERATIVE DIAGNOSES:  1. Chronic kidney disease nearing dialysis dependence.  2. Poorly maturing left radiocephalic AV fistula.   POSTOPERATIVE DIAGNOSES:  1. Chronic kidney disease nearing dialysis dependence.  2. Poorly maturing left radiocephalic AV fistula.   PROCEDURES PERFORMED: 1. Ultrasound guidance for vascular access to left radiocephalic AV fistula.  2. Left upper extremity fistulogram.  3. Percutaneous transluminal angioplasty of perianastomotic stenosis with 7 mm diameter angioplasty balloon.   SURGEON: Algernon Huxley, M.D.   ANESTHESIA: Local with moderate conscious sedation.   ESTIMATED BLOOD LOSS: Approximately 25 mL.  FLUOROSCOPY TIME: 1.6 minutes.   CONTRAST USED: 20 mL.   INDICATION FOR PROCEDURE: This is a 74 year old white male nearing dialysis dependence. His left radiocephalic AV fistula has a stenosis which threatens it. It has not matured well enough for use and fistulogram is to be performed for further evaluation and possible treatment. Risks and benefits were discussed and informed consent was obtained.   DESCRIPTION OF PROCEDURE: The patient was brought to the vascular interventional radiology suite. The left upper extremity was sterilely prepped and draped and a sterile surgical field was created. The cephalic vein was accessed in a retrograde fashion in the forearm just below the antecubital fossa and under direct ultrasound guidance a permanent image was recorded. A micropuncture wire, sheath, and 6 French sheath were placed. The patient given 2500 units of intravenous Heparin for systemic anticoagulation. Imaging showed an approximately 70 to 80% stenosis in the swing point just beyond the anastomosis and the cephalic vein. Limited imaging was performed and central venogram was not performed due to the fact he was not yet on dialysis and desired to  limit contrast. I crossed the lesion without difficulty with a Magic torque wire, treated this with a 7 mm conventional and high-pressure angioplasty balloon, and a tight waste taken which resolved. Completion angiogram following this showed minimal residual stenosis with good flow through the fistula with an excellent palpable thrill. At this point, a 4-0 Monocryl pursestring suture was placed around the sheath, the sheath was removed, pressure was held, and a sterile dressing was placed. ____________________________ Algernon Huxley, MD jsd:slb D: 12/29/2011 11:16:04 ET T: 12/29/2011 16:47:53 ET JOB#: [id number]  cc: Algernon Huxley, MD, <Dictator> Algernon Huxley MD ELECTRONICALLY SIGNED 12/31/2011 15:12

## 2014-12-10 NOTE — Consult Note (Signed)
PATIENT NAME:  Nicholas [name], Nicholas [name] MR#:  [id number] DATE OF BIRTH:  [birth year]  DATE OF CONSULTATION:  01/07/2012  REFERRING PHYSICIAN:  Ramonita Lab, MD CONSULTING PHYSICIAN:  Gaylyn Cheers, MD / Janalyn Harder. Jerelene Redden, ANP  REASON FOR CONSULTATION: Gastrointestinal bleed.   HISTORY OF PRESENT ILLNESS: This 74 year old patient of Dr. Ramonita Lab was admitted to the hospital for lower gastrointestinal bleed and associated acute on chronic anemia. The patient reports normal bowel habits and he felt fine yesterday until 1800 when he had cramps. He had to have an urgent bowel movement. The patient used the toilet but did not look in the commode. Fifteen minutes later he had recurrence of crampiness and diarrhea for four to five movements every 15 minutes and this was fresh blood in the commode. He went to bed, slept all night, and this morning he had five more episodes of bloody stool, the last was about 10 oclock  a.m. He did not believe the bowels were changing color or the bleeding was slowing. He has had no further abdominal complaints except right before a bowel movement he felt a little bit of primarily left lower crampiness. He says he feels fine today. He has noticed no change in bowel habits proceeding yesterday. He reports he has had normal appetite, diet, weight, and no heartburn or indigestion. Stomach yesterday was loud and gurgling. He says he feels fine now.   Prior colonoscopy advanced to the splenic flexure, 08/31/2007, revealed diverticulosis and moderate size internal hemorrhoids. He denies prior history of EGD or prior problems with peptic ulcer disease. The patient has multiple medical problems and does take a low dose aspirin, diabetic medication, and ranitidine twice daily as omeprazole had caused diarrhea. The patient says overall he feels pretty well.   PAST MEDICAL HISTORY:  1. Coronary artery disease.  2. Gout.  3. Benign prostatic hypertrophy.  4. Hypertension.  5. Osteoarthritis.   6. Osteoporosis.  7. Cataracts.  8. Depression.  9. Ankylosing spondylitis.  10. End stage renal disease, renal insufficiency, with AV shunt placed for preparation for eventual dialysis. 11. History of edema likely venous insufficiency.  12. Hyperlipidemia.  13. Diabetes mellitus diagnosed in 2009.  14. History of rhabdomyolysis, on fenofibrate.  PAST SURGICAL HISTORY:  1. Cataract surgery bilateral.  2. Status post angioplasty.  3. Fusion of vertebra.  4. Status post left hip replacement in 2003, Dr. Mauri Pole. 5. AV fistula formation, left forearm, in 2012.  6. Hemorrhoidectomy.  7. Bilateral cataract surgery.   MEDICATIONS ON ARRIVAL:  1. Aspirin 81 mg daily.  2. Amaryl 2 mg daily.  3. Ultracet 1 tablet three times daily. 4. Vitamin D 1000 units daily.  5. Sodium bicarbonate 650 mg two tablets twice daily.  6. Lasix 20 mg every a.m.  7. Zantac 150 mg twice daily.  8. Clonidine 0.1 mg daily.  9. Flomax 0.4 mg at bedtime.  10. Metoprolol 25 mg twice daily.  11. Neurontin 300 mg three times daily. 12. Allopurinol 100 mg twice daily.   ALLERGIES: Rhabdomyolysis on fenofibrate. Rhabdomyolysis on lovastatin. Omeprazole yields diarrhea.  HABITS: Previous tobacco, quit in 1992. Denies alcohol, except social beer once a month or so.  FAMILY HISTORY: Negative for colon cancer or colon polyps. Father had a heart attack at age 53. Mother deceased at age 26 with heart failure.   REVIEW OF SYSTEMS: The patient states he gets along quite well for all his medical problems. He maintains normal activities of daily living, does his own housework,  and shops. He does have chronic right elbow swelling, stable. Review of systems negative x10 with the exception of GI, as noted in history.   PHYSICAL EXAMINATION:   VITAL SIGNS: 97.4, 76, 21, 114/55, and 99% on room air.   GENERAL: Elderly Caucasian male resting in bed, in no acute distress.   HEENT: Head is normocephalic. Conjunctivae pale  pink. Sclerae anicteric. Oral mucosa is dry and intact.   NECK: Supple. Trachea midline.   HEART: Heart tones S1 and S2 without murmur or gallop.   LUNGS: Clear to auscultation. Respirations are eupneic.   ABDOMEN: Soft. Denies any pain but with palpation of left lower quadrant he reports residual achiness after the exam.   RECTAL: Digital rectal exam shows small amount of darkish red blood, dried, no actual stool present. Did not palpate for internal masses. Minimal penetration.   MUSCULOSKELETAL: Right elbow with a large soft mass. 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 No joint swelling or inflammation noted.   SKIN: Warm and dry without rash, edema.   NEURO: Cranial nerves II through XII grossly intact.   PSYCH: Affect and mood within normal, very pleasant and appears relaxed. Good memory.   LABS: Admitting blood work from [facility] office with hemoglobin 6.7, hematocrit 21.0, and MCV 89.7. Prior hemoglobin in September 2012 was 10.5, prior to that 9.4 range. BUN is 159 up from baseline of 90. Creatinine 4.8 with baseline 3.8 to 4.6. Potassium 3.0 and sodium 129, previously 135.   RADIOLOGY: No studies to report.   IMPRESSION: The patient developed abrupt bright red blood per rectum with vague left lower quadrant initial crampiness and minimal tenderness with palpation to suggest diverticular bleed. This could be diverticulitis as well, doubt ischemia.  Colonoscopy to the splenic flexure showed internal hemorrhoids and diverticulosis in 2009. There are no upper GI complaints, but the patient is with multiple medical problems and renal disease that puts him at higher risk for erosive gastritis. The possibility of an upper bleed is considered as well. We will therefore increase his IV proton pump inhibitors to once daily, IV Protonix, and monitor for diarrhea.   PLAN: IV Cipro and Flagyl. Consideration for EGD tomorrow, per Dr. Aurora Mask recommendation. The  patient will get blood transfusions tonight. Electrolyte imbalance noted and discussed with Dr. Vira Agar. Renal consultation has been requested by his internist. The patient was evaluated by Dr. Vira Agar in collaboration of care. Further GI recommendations pending.   Thank you for this consultation.  These services are provided by Joelene Millin A. Jerelene Redden, RN, MS, APRN, [facility], ANP under collaborative agreement with Manya Silvas, MD. ____________________________ Janalyn Harder Jerelene Redden, ANP kam:slb D: 01/08/2012 08:04:06 ET T: 01/08/2012 09:03:32 ET JOB#: [id number]  cc: Joelene Millin A. Jerelene Redden, ANP, <Dictator> [name], MD Janalyn Harder Sherlyn Hay, MSN, ANP-BC Adult Nurse Practitioner ELECTRONICALLY SIGNED 01/08/2012 10:02

## 2014-12-10 NOTE — Consult Note (Signed)
See NP note.  Plan to do EGD tomorrow afternoon, uncertain if upper or lower bleed.  Previous colonoscopy to splenic flexure in 2009, no prior EGD. No bleeding since 10 am, this is encouraging.  He should get transfusions tonightdue to his hx of heart disease.  Electronic Signatures: Manya Silvas (MD)  (Signed on 22-May-13 18:42)  Authored  Last Updated: 22-May-13 18:42 by Manya Silvas (MD)

## 2014-12-10 NOTE — Consult Note (Signed)
Brief Consult Note: Diagnosis: LGI bleed with very slight LLQ discomfort with palpation with associated anemia.   Patient was seen by consultant.   Consult note dictated.   Comments: Last bloody bm was 10am. DRE by me darkish red blood-dry. Patient denies abd pain, but after exam reports mild discomfort LLQ and recalls lower abdominal pain on left when the rectal bleeding first began yest 1800. This is likely a diverticular bleed with marked elevation in baseline BUN with known renal disease. Patient is feeling well and denies ROS. Plan: NPO except sips of water. Discussed with Dr. Vira Agar and goal is  to stimulate the gi tract as little as possible for clot to stabilize. Monitor closely for gi bleed, and patient knows to call nursing if he passes bloody stool again. Nursing told to call Dr. Vira Agar if patient re-bleeds as he will likely need gi bleed scan.  Agree with  IV Cipro/recommend IV flagyl, blood transfusion, and close laboratory monitoring with electrolyte abnormalities ins etting of renal failure  Case d/w Dr. Vira Agar in collaboration of care.  Electronic Signatures for Addendum Section:  Gershon Mussel (NP) (Signed Addendum [phone] 18:41)  After further discussion with  Dr. Vira Agar,  change oral Flagyl to IV for better penetration into the colon lining and renal dose per pharmacist is 250mg  IV q 12 hours. Patient has omeprazole allergy listed as diarrhea. Change from po zantac to once day IV Protonix and monitor for diarrhea. He likely has lower gi bleed focus, but it is prudent to cover for ugi etiology-as well given his multiple medical problems. [facility] labs today: Potassium of 3.0, Sodium 129, Chloride 85, BUN 159/cr 4.8 noted and discussed with Dr. Vira Agar with no orders for electrolyte replacement at this time given complex renal patient and will defer to his internist.   Electronic Signatures: Gershon Mussel (NP)  (Signed [id number] 18:16)  Authored: Brief Consult  Note   Last Updated: 22-May-13 18:41 by Gershon Mussel (NP)
